# Patient Record
Sex: Female | Born: 1935 | Race: White | Hispanic: No | Smoking: Never smoker
Health system: Southern US, Community
[De-identification: ages and names within clinical notes are randomized; demographics above are authoritative.]

## PROBLEM LIST (undated history)

## (undated) DIAGNOSIS — I5189 Other ill-defined heart diseases: Secondary | ICD-10-CM

## (undated) DIAGNOSIS — M48061 Spinal stenosis, lumbar region without neurogenic claudication: Secondary | ICD-10-CM

## (undated) DIAGNOSIS — M545 Low back pain, unspecified: Secondary | ICD-10-CM

## (undated) DIAGNOSIS — G459 Transient cerebral ischemic attack, unspecified: Secondary | ICD-10-CM

## (undated) DIAGNOSIS — M1990 Unspecified osteoarthritis, unspecified site: Secondary | ICD-10-CM

## (undated) DIAGNOSIS — I080 Rheumatic disorders of both mitral and aortic valves: Secondary | ICD-10-CM

## (undated) DIAGNOSIS — N183 Chronic kidney disease, stage 3 unspecified: Secondary | ICD-10-CM

## (undated) DIAGNOSIS — I495 Sick sinus syndrome: Secondary | ICD-10-CM

## (undated) DIAGNOSIS — F419 Anxiety disorder, unspecified: Secondary | ICD-10-CM

## (undated) DIAGNOSIS — I10 Essential (primary) hypertension: Secondary | ICD-10-CM

## (undated) DIAGNOSIS — I2789 Other specified pulmonary heart diseases: Secondary | ICD-10-CM

## (undated) DIAGNOSIS — I219 Acute myocardial infarction, unspecified: Secondary | ICD-10-CM

## (undated) DIAGNOSIS — F32A Depression, unspecified: Secondary | ICD-10-CM

## (undated) DIAGNOSIS — I4891 Unspecified atrial fibrillation: Secondary | ICD-10-CM

## (undated) DIAGNOSIS — F329 Major depressive disorder, single episode, unspecified: Secondary | ICD-10-CM

## (undated) DIAGNOSIS — I509 Heart failure, unspecified: Secondary | ICD-10-CM

## (undated) DIAGNOSIS — R32 Unspecified urinary incontinence: Secondary | ICD-10-CM

## (undated) DIAGNOSIS — E785 Hyperlipidemia, unspecified: Secondary | ICD-10-CM

## (undated) DIAGNOSIS — H9190 Unspecified hearing loss, unspecified ear: Secondary | ICD-10-CM

## (undated) DIAGNOSIS — G8929 Other chronic pain: Secondary | ICD-10-CM

## (undated) DIAGNOSIS — K219 Gastro-esophageal reflux disease without esophagitis: Secondary | ICD-10-CM

## (undated) DIAGNOSIS — I82409 Acute embolism and thrombosis of unspecified deep veins of unspecified lower extremity: Secondary | ICD-10-CM

## (undated) DIAGNOSIS — Z9289 Personal history of other medical treatment: Secondary | ICD-10-CM

## (undated) DIAGNOSIS — I872 Venous insufficiency (chronic) (peripheral): Secondary | ICD-10-CM

## (undated) DIAGNOSIS — Z950 Presence of cardiac pacemaker: Secondary | ICD-10-CM

## (undated) DIAGNOSIS — C50919 Malignant neoplasm of unspecified site of unspecified female breast: Secondary | ICD-10-CM

## (undated) DIAGNOSIS — D649 Anemia, unspecified: Secondary | ICD-10-CM

## (undated) DIAGNOSIS — M4800 Spinal stenosis, site unspecified: Secondary | ICD-10-CM

## (undated) DIAGNOSIS — R609 Edema, unspecified: Secondary | ICD-10-CM

## (undated) DIAGNOSIS — E039 Hypothyroidism, unspecified: Secondary | ICD-10-CM

## (undated) DIAGNOSIS — M069 Rheumatoid arthritis, unspecified: Secondary | ICD-10-CM

## (undated) DIAGNOSIS — Z8719 Personal history of other diseases of the digestive system: Secondary | ICD-10-CM

## (undated) HISTORY — PX: BREAST BIOPSY: SHX20

## (undated) HISTORY — DX: Unspecified urinary incontinence: R32

## (undated) HISTORY — DX: Venous insufficiency (chronic) (peripheral): I87.2

## (undated) HISTORY — PX: BREAST SURGERY: SHX581

## (undated) HISTORY — PX: JOINT REPLACEMENT: SHX530

## (undated) HISTORY — DX: Essential (primary) hypertension: I10

## (undated) HISTORY — DX: Rheumatic disorders of both mitral and aortic valves: I08.0

## (undated) HISTORY — PX: INSERT / REPLACE / REMOVE PACEMAKER: SUR710

## (undated) HISTORY — DX: Gastro-esophageal reflux disease without esophagitis: K21.9

## (undated) HISTORY — DX: Other specified pulmonary heart diseases: I27.89

## (undated) HISTORY — DX: Rheumatoid arthritis, unspecified: M06.9

## (undated) HISTORY — DX: Spinal stenosis, site unspecified: M48.00

## (undated) HISTORY — DX: Unspecified hearing loss, unspecified ear: H91.90

## (undated) HISTORY — DX: Anemia, unspecified: D64.9

## (undated) HISTORY — DX: Unspecified atrial fibrillation: I48.91

## (undated) HISTORY — DX: Hypothyroidism, unspecified: E03.9

## (undated) HISTORY — DX: Other ill-defined heart diseases: I51.89

## (undated) HISTORY — DX: Transient cerebral ischemic attack, unspecified: G45.9

## (undated) HISTORY — DX: Edema, unspecified: R60.9

## (undated) HISTORY — DX: Sick sinus syndrome: I49.5

## (undated) HISTORY — DX: Malignant neoplasm of unspecified site of unspecified female breast: C50.919

## (undated) HISTORY — DX: Hyperlipidemia, unspecified: E78.5

---

## 1968-05-02 HISTORY — PX: ABDOMINAL HYSTERECTOMY: SHX81

## 1975-05-03 DIAGNOSIS — I219 Acute myocardial infarction, unspecified: Secondary | ICD-10-CM

## 1975-05-03 HISTORY — DX: Acute myocardial infarction, unspecified: I21.9

## 1993-05-02 HISTORY — PX: TOTAL HIP ARTHROPLASTY: SHX124

## 1996-05-02 HISTORY — PX: CARPAL TUNNEL RELEASE: SHX101

## 1999-04-06 HISTORY — PX: CHOLECYSTECTOMY: SHX55

## 2001-01-30 HISTORY — PX: TOTAL HIP ARTHROPLASTY: SHX124

## 2003-05-03 HISTORY — PX: PACEMAKER PLACEMENT: SHX43

## 2006-12-01 DIAGNOSIS — C50919 Malignant neoplasm of unspecified site of unspecified female breast: Secondary | ICD-10-CM

## 2006-12-01 HISTORY — DX: Malignant neoplasm of unspecified site of unspecified female breast: C50.919

## 2006-12-01 HISTORY — PX: BREAST LUMPECTOMY: SHX2

## 2007-05-03 LAB — HM COLONOSCOPY

## 2008-06-11 ENCOUNTER — Encounter: Payer: Self-pay | Admitting: Internal Medicine

## 2008-07-31 HISTORY — PX: PARATHYROIDECTOMY: SHX19

## 2008-09-02 ENCOUNTER — Encounter: Payer: Self-pay | Admitting: Internal Medicine

## 2009-01-30 LAB — CONVERTED CEMR LAB: Pap Smear: NEGATIVE

## 2009-03-05 ENCOUNTER — Encounter: Payer: Self-pay | Admitting: Internal Medicine

## 2009-03-13 ENCOUNTER — Telehealth (INDEPENDENT_AMBULATORY_CARE_PROVIDER_SITE_OTHER): Payer: Self-pay | Admitting: *Deleted

## 2009-03-18 DIAGNOSIS — C50919 Malignant neoplasm of unspecified site of unspecified female breast: Secondary | ICD-10-CM | POA: Insufficient documentation

## 2009-03-18 DIAGNOSIS — I2789 Other specified pulmonary heart diseases: Secondary | ICD-10-CM

## 2009-03-18 DIAGNOSIS — Z950 Presence of cardiac pacemaker: Secondary | ICD-10-CM

## 2009-03-18 DIAGNOSIS — I080 Rheumatic disorders of both mitral and aortic valves: Secondary | ICD-10-CM | POA: Insufficient documentation

## 2009-03-18 DIAGNOSIS — I5023 Acute on chronic systolic (congestive) heart failure: Secondary | ICD-10-CM | POA: Insufficient documentation

## 2009-03-18 DIAGNOSIS — I517 Cardiomegaly: Secondary | ICD-10-CM | POA: Insufficient documentation

## 2009-03-18 DIAGNOSIS — I495 Sick sinus syndrome: Secondary | ICD-10-CM | POA: Insufficient documentation

## 2009-03-19 ENCOUNTER — Ambulatory Visit: Payer: Self-pay | Admitting: Internal Medicine

## 2009-03-19 ENCOUNTER — Ambulatory Visit: Payer: Self-pay | Admitting: Cardiology

## 2009-03-19 DIAGNOSIS — I4891 Unspecified atrial fibrillation: Secondary | ICD-10-CM | POA: Insufficient documentation

## 2009-03-19 DIAGNOSIS — R609 Edema, unspecified: Secondary | ICD-10-CM

## 2009-03-19 HISTORY — DX: Edema, unspecified: R60.9

## 2009-03-19 LAB — CONVERTED CEMR LAB: POC INR: 2.6

## 2009-03-20 ENCOUNTER — Telehealth: Payer: Self-pay | Admitting: Internal Medicine

## 2009-04-14 ENCOUNTER — Telehealth: Payer: Self-pay | Admitting: Internal Medicine

## 2009-04-16 ENCOUNTER — Emergency Department (HOSPITAL_COMMUNITY): Admission: EM | Admit: 2009-04-16 | Discharge: 2009-04-16 | Payer: Self-pay | Admitting: Family Medicine

## 2009-04-20 ENCOUNTER — Encounter: Payer: Self-pay | Admitting: Cardiology

## 2009-04-20 LAB — CONVERTED CEMR LAB: POC INR: 3.1

## 2009-05-07 ENCOUNTER — Encounter: Payer: Self-pay | Admitting: Internal Medicine

## 2009-05-07 ENCOUNTER — Ambulatory Visit (HOSPITAL_COMMUNITY): Admission: RE | Admit: 2009-05-07 | Discharge: 2009-05-07 | Payer: Self-pay | Admitting: Internal Medicine

## 2009-05-07 ENCOUNTER — Ambulatory Visit: Payer: Self-pay | Admitting: Internal Medicine

## 2009-05-07 ENCOUNTER — Ambulatory Visit: Payer: Self-pay

## 2009-05-07 ENCOUNTER — Ambulatory Visit: Payer: Self-pay | Admitting: Cardiovascular Disease

## 2009-05-07 LAB — CONVERTED CEMR LAB: POC INR: 2.9

## 2009-05-21 ENCOUNTER — Ambulatory Visit: Payer: Self-pay | Admitting: Internal Medicine

## 2009-05-21 DIAGNOSIS — I10 Essential (primary) hypertension: Secondary | ICD-10-CM

## 2009-05-21 DIAGNOSIS — M129 Arthropathy, unspecified: Secondary | ICD-10-CM | POA: Insufficient documentation

## 2009-05-21 DIAGNOSIS — Z9189 Other specified personal risk factors, not elsewhere classified: Secondary | ICD-10-CM | POA: Insufficient documentation

## 2009-05-21 DIAGNOSIS — K219 Gastro-esophageal reflux disease without esophagitis: Secondary | ICD-10-CM | POA: Insufficient documentation

## 2009-05-21 DIAGNOSIS — Z87448 Personal history of other diseases of urinary system: Secondary | ICD-10-CM | POA: Insufficient documentation

## 2009-05-21 DIAGNOSIS — E039 Hypothyroidism, unspecified: Secondary | ICD-10-CM

## 2009-05-21 DIAGNOSIS — R32 Unspecified urinary incontinence: Secondary | ICD-10-CM

## 2009-05-21 DIAGNOSIS — E785 Hyperlipidemia, unspecified: Secondary | ICD-10-CM

## 2009-05-22 DIAGNOSIS — R490 Dysphonia: Secondary | ICD-10-CM | POA: Insufficient documentation

## 2009-05-22 LAB — CONVERTED CEMR LAB
Albumin: 3.7 g/dL (ref 3.5–5.2)
Basophils Absolute: 0 10*3/uL (ref 0.0–0.1)
CO2: 27 meq/L (ref 19–32)
Calcium: 9.3 mg/dL (ref 8.4–10.5)
Cholesterol: 127 mg/dL (ref 0–200)
Eosinophils Absolute: 0.1 10*3/uL (ref 0.0–0.7)
Glucose, Bld: 95 mg/dL (ref 70–99)
HCT: 34.3 % — ABNORMAL LOW (ref 36.0–46.0)
HDL: 54.5 mg/dL (ref >39.00)
Hemoglobin: 11.2 g/dL — ABNORMAL LOW (ref 12.0–15.0)
Lymphocytes Relative: 43.8 % (ref 12.0–46.0)
Lymphs Abs: 2.1 10*3/uL (ref 0.7–4.0)
MCHC: 32.6 g/dL (ref 30.0–36.0)
Monocytes Absolute: 0.6 10*3/uL (ref 0.1–1.0)
Neutro Abs: 1.9 10*3/uL (ref 1.4–7.7)
RDW: 15.4 % — ABNORMAL HIGH (ref 11.5–14.6)
Sodium: 139 meq/L (ref 135–145)
Total Protein: 7.3 g/dL (ref 6.0–8.3)
Triglycerides: 111 mg/dL (ref 0.0–149.0)

## 2009-06-04 ENCOUNTER — Ambulatory Visit: Payer: Self-pay | Admitting: Cardiology

## 2009-06-18 ENCOUNTER — Ambulatory Visit: Payer: Self-pay | Admitting: Cardiology

## 2009-06-24 ENCOUNTER — Telehealth (INDEPENDENT_AMBULATORY_CARE_PROVIDER_SITE_OTHER): Payer: Self-pay | Admitting: *Deleted

## 2009-06-24 DIAGNOSIS — H9190 Unspecified hearing loss, unspecified ear: Secondary | ICD-10-CM | POA: Insufficient documentation

## 2009-07-13 ENCOUNTER — Encounter: Payer: Self-pay | Admitting: Internal Medicine

## 2009-07-15 ENCOUNTER — Encounter: Payer: Self-pay | Admitting: Internal Medicine

## 2009-07-16 ENCOUNTER — Ambulatory Visit: Payer: Self-pay | Admitting: Internal Medicine

## 2009-07-16 ENCOUNTER — Encounter: Payer: Self-pay | Admitting: Internal Medicine

## 2009-08-12 ENCOUNTER — Encounter: Payer: Self-pay | Admitting: Internal Medicine

## 2009-08-17 ENCOUNTER — Ambulatory Visit: Payer: Self-pay | Admitting: Cardiology

## 2009-08-17 LAB — CONVERTED CEMR LAB: POC INR: 3.1

## 2009-08-27 ENCOUNTER — Ambulatory Visit: Payer: Self-pay | Admitting: Internal Medicine

## 2009-08-31 ENCOUNTER — Ambulatory Visit: Payer: Self-pay | Admitting: Cardiology

## 2009-08-31 LAB — CONVERTED CEMR LAB: POC INR: 3.1

## 2009-09-17 ENCOUNTER — Ambulatory Visit: Payer: Self-pay | Admitting: Internal Medicine

## 2009-09-24 ENCOUNTER — Telehealth: Payer: Self-pay | Admitting: Internal Medicine

## 2009-10-08 ENCOUNTER — Ambulatory Visit: Payer: Self-pay | Admitting: Internal Medicine

## 2009-10-08 LAB — CONVERTED CEMR LAB: POC INR: 2

## 2009-10-12 ENCOUNTER — Encounter: Payer: Self-pay | Admitting: Internal Medicine

## 2009-10-29 ENCOUNTER — Encounter: Payer: Self-pay | Admitting: Internal Medicine

## 2009-11-05 ENCOUNTER — Ambulatory Visit: Payer: Self-pay | Admitting: Internal Medicine

## 2009-11-05 LAB — CONVERTED CEMR LAB: POC INR: 2.6

## 2009-11-12 ENCOUNTER — Encounter: Payer: Self-pay | Admitting: Internal Medicine

## 2009-11-23 ENCOUNTER — Ambulatory Visit: Payer: Self-pay | Admitting: Internal Medicine

## 2009-11-23 ENCOUNTER — Inpatient Hospital Stay (HOSPITAL_COMMUNITY): Admission: EM | Admit: 2009-11-23 | Discharge: 2009-11-23 | Payer: Self-pay | Admitting: Emergency Medicine

## 2009-11-23 ENCOUNTER — Encounter: Payer: Self-pay | Admitting: Internal Medicine

## 2009-11-24 ENCOUNTER — Ambulatory Visit: Payer: Self-pay

## 2009-11-24 ENCOUNTER — Encounter: Payer: Self-pay | Admitting: Internal Medicine

## 2009-11-24 ENCOUNTER — Encounter: Payer: Self-pay | Admitting: Cardiovascular Disease

## 2009-11-24 ENCOUNTER — Ambulatory Visit: Payer: Self-pay | Admitting: Cardiovascular Disease

## 2009-11-24 ENCOUNTER — Encounter (HOSPITAL_COMMUNITY): Admission: RE | Admit: 2009-11-24 | Discharge: 2010-01-21 | Payer: Self-pay | Admitting: Internal Medicine

## 2009-11-27 ENCOUNTER — Ambulatory Visit: Payer: Self-pay | Admitting: Internal Medicine

## 2009-11-27 DIAGNOSIS — R0789 Other chest pain: Secondary | ICD-10-CM | POA: Insufficient documentation

## 2009-12-09 ENCOUNTER — Ambulatory Visit: Payer: Self-pay | Admitting: Cardiology

## 2009-12-23 ENCOUNTER — Encounter: Payer: Self-pay | Admitting: Internal Medicine

## 2009-12-23 ENCOUNTER — Ambulatory Visit: Payer: Self-pay | Admitting: Internal Medicine

## 2009-12-28 ENCOUNTER — Ambulatory Visit: Payer: Self-pay | Admitting: Internal Medicine

## 2009-12-30 LAB — CONVERTED CEMR LAB
ALT: 30 units/L (ref 0–35)
AST: 26 units/L (ref 0–37)
Bilirubin, Direct: 0.1 mg/dL (ref 0.0–0.3)
Direct LDL: 131.4 mg/dL
Total Bilirubin: 0.7 mg/dL (ref 0.3–1.2)
Total CHOL/HDL Ratio: 4

## 2010-01-06 ENCOUNTER — Ambulatory Visit: Payer: Self-pay | Admitting: Cardiovascular Disease

## 2010-01-18 ENCOUNTER — Encounter: Payer: Self-pay | Admitting: Internal Medicine

## 2010-01-26 ENCOUNTER — Ambulatory Visit: Payer: Self-pay | Admitting: Internal Medicine

## 2010-01-26 DIAGNOSIS — N390 Urinary tract infection, site not specified: Secondary | ICD-10-CM

## 2010-01-26 DIAGNOSIS — M069 Rheumatoid arthritis, unspecified: Secondary | ICD-10-CM

## 2010-01-26 LAB — CONVERTED CEMR LAB
Glucose, Urine, Semiquant: NEGATIVE
Ketones, urine, test strip: NEGATIVE
Nitrite: POSITIVE
Protein, U semiquant: >=300
pH: 5

## 2010-02-04 ENCOUNTER — Ambulatory Visit: Payer: Self-pay | Admitting: Internal Medicine

## 2010-02-04 LAB — CONVERTED CEMR LAB: POC INR: 2.1

## 2010-02-25 ENCOUNTER — Telehealth: Payer: Self-pay | Admitting: Internal Medicine

## 2010-03-02 ENCOUNTER — Telehealth: Payer: Self-pay | Admitting: Internal Medicine

## 2010-03-02 ENCOUNTER — Ambulatory Visit: Payer: Self-pay | Admitting: Internal Medicine

## 2010-03-02 DIAGNOSIS — A088 Other specified intestinal infections: Secondary | ICD-10-CM

## 2010-03-08 ENCOUNTER — Ambulatory Visit: Payer: Self-pay | Admitting: Internal Medicine

## 2010-03-08 LAB — CONVERTED CEMR LAB: POC INR: 3.5

## 2010-03-10 ENCOUNTER — Ambulatory Visit: Payer: Self-pay | Admitting: Internal Medicine

## 2010-03-10 DIAGNOSIS — R197 Diarrhea, unspecified: Secondary | ICD-10-CM

## 2010-03-15 ENCOUNTER — Encounter: Payer: Self-pay | Admitting: Internal Medicine

## 2010-03-15 LAB — CONVERTED CEMR LAB
ALT: 33 units/L (ref 0–35)
Albumin: 3.6 g/dL (ref 3.5–5.2)
Basophils Relative: 0.8 % (ref 0.0–3.0)
Bilirubin Urine: NEGATIVE
Bilirubin, Direct: 0.1 mg/dL (ref 0.0–0.3)
CO2: 26 meq/L (ref 19–32)
Chloride: 104 meq/L (ref 96–112)
Eosinophils Absolute: 0.3 10*3/uL (ref 0.0–0.7)
Eosinophils Relative: 3.3 % (ref 0.0–5.0)
HCT: 31.6 % — ABNORMAL LOW (ref 36.0–46.0)
Hemoglobin, Urine: NEGATIVE
Hemoglobin: 10.8 g/dL — ABNORMAL LOW (ref 12.0–15.0)
MCHC: 34.2 g/dL (ref 30.0–36.0)
MCV: 102.9 fL — ABNORMAL HIGH (ref 78.0–100.0)
Monocytes Absolute: 1.1 10*3/uL — ABNORMAL HIGH (ref 0.1–1.0)
Neutro Abs: 6.7 10*3/uL (ref 1.4–7.7)
Potassium: 5.1 meq/L (ref 3.5–5.1)
RBC: 3.07 M/uL — ABNORMAL LOW (ref 3.87–5.11)
Sodium: 140 meq/L (ref 135–145)
Total Protein, Urine: NEGATIVE mg/dL
Total Protein: 6.4 g/dL (ref 6.0–8.3)
Urine Glucose: NEGATIVE mg/dL
Urobilinogen, UA: 0.2 (ref 0.0–1.0)
WBC: 10 10*3/uL (ref 4.5–10.5)

## 2010-03-16 ENCOUNTER — Encounter: Payer: Self-pay | Admitting: Internal Medicine

## 2010-03-18 ENCOUNTER — Telehealth: Payer: Self-pay | Admitting: Internal Medicine

## 2010-03-18 ENCOUNTER — Ambulatory Visit: Payer: Self-pay | Admitting: Internal Medicine

## 2010-03-24 ENCOUNTER — Telehealth: Payer: Self-pay | Admitting: Internal Medicine

## 2010-04-01 ENCOUNTER — Ambulatory Visit: Payer: Self-pay | Admitting: Cardiovascular Disease

## 2010-04-02 ENCOUNTER — Telehealth: Payer: Self-pay | Admitting: Internal Medicine

## 2010-04-12 ENCOUNTER — Ambulatory Visit: Payer: Self-pay | Admitting: Internal Medicine

## 2010-04-28 ENCOUNTER — Ambulatory Visit: Payer: Self-pay | Admitting: Cardiology

## 2010-05-10 ENCOUNTER — Encounter: Payer: Self-pay | Admitting: Internal Medicine

## 2010-05-12 ENCOUNTER — Ambulatory Visit
Admission: RE | Admit: 2010-05-12 | Discharge: 2010-05-12 | Payer: Self-pay | Source: Home / Self Care | Attending: Internal Medicine | Admitting: Internal Medicine

## 2010-05-12 ENCOUNTER — Other Ambulatory Visit: Payer: Self-pay | Admitting: Internal Medicine

## 2010-05-12 DIAGNOSIS — R509 Fever, unspecified: Secondary | ICD-10-CM | POA: Insufficient documentation

## 2010-05-12 LAB — CBC WITH DIFFERENTIAL/PLATELET
Basophils Absolute: 0 10*3/uL (ref 0.0–0.1)
Basophils Relative: 0.7 % (ref 0.0–3.0)
Eosinophils Absolute: 0.2 10*3/uL (ref 0.0–0.7)
Eosinophils Relative: 3.3 % (ref 0.0–5.0)
HCT: 30.8 % — ABNORMAL LOW (ref 36.0–46.0)
Hemoglobin: 10.6 g/dL — ABNORMAL LOW (ref 12.0–15.0)
Lymphocytes Relative: 27.8 % (ref 12.0–46.0)
Lymphs Abs: 1.5 10*3/uL (ref 0.7–4.0)
MCHC: 34.2 g/dL (ref 30.0–36.0)
MCV: 102 fl — ABNORMAL HIGH (ref 78.0–100.0)
Monocytes Absolute: 0.6 10*3/uL (ref 0.1–1.0)
Monocytes Relative: 11.9 % (ref 3.0–12.0)
Neutro Abs: 3 10*3/uL (ref 1.4–7.7)
Neutrophils Relative %: 56.3 % (ref 43.0–77.0)
Platelets: 129 10*3/uL — ABNORMAL LOW (ref 150.0–400.0)
RBC: 3.03 Mil/uL — ABNORMAL LOW (ref 3.87–5.11)
RDW: 16.8 % — ABNORMAL HIGH (ref 11.5–14.6)
WBC: 5.3 10*3/uL (ref 4.5–10.5)

## 2010-05-12 LAB — BASIC METABOLIC PANEL
BUN: 14 mg/dL (ref 6–23)
CO2: 25 mEq/L (ref 19–32)
Calcium: 8.4 mg/dL (ref 8.4–10.5)
Chloride: 107 mEq/L (ref 96–112)
Creatinine, Ser: 0.7 mg/dL (ref 0.4–1.2)
GFR: 81.58 mL/min (ref >60.00)
Glucose, Bld: 95 mg/dL (ref 70–99)
Potassium: 4.2 mEq/L (ref 3.5–5.1)
Sodium: 140 mEq/L (ref 135–145)

## 2010-05-12 LAB — HEPATIC FUNCTION PANEL
ALT: 26 U/L (ref 0–35)
AST: 29 U/L (ref 0–37)
Albumin: 3.4 g/dL — ABNORMAL LOW (ref 3.5–5.2)
Alkaline Phosphatase: 59 U/L (ref 39–117)
Bilirubin, Direct: 0.2 mg/dL (ref 0.0–0.3)
Total Bilirubin: 0.6 mg/dL (ref 0.3–1.2)
Total Protein: 6 g/dL (ref 6.0–8.3)

## 2010-05-13 ENCOUNTER — Telehealth: Payer: Self-pay | Admitting: Internal Medicine

## 2010-05-13 LAB — URINALYSIS, ROUTINE W REFLEX MICROSCOPIC
Bilirubin Urine: NEGATIVE
Hemoglobin, Urine: NEGATIVE
Ketones, ur: NEGATIVE
Nitrite: NEGATIVE
Specific Gravity, Urine: 1.01 (ref 1.000–1.030)
Total Protein, Urine: NEGATIVE
Urine Glucose: NEGATIVE
Urobilinogen, UA: 0.2 (ref 0.0–1.0)
pH: 6.5 (ref 5.0–8.0)

## 2010-05-19 ENCOUNTER — Encounter: Payer: Self-pay | Admitting: Internal Medicine

## 2010-05-24 ENCOUNTER — Encounter: Payer: Self-pay | Admitting: Internal Medicine

## 2010-05-25 ENCOUNTER — Ambulatory Visit: Admission: RE | Admit: 2010-05-25 | Discharge: 2010-05-25 | Payer: Self-pay | Source: Home / Self Care

## 2010-05-26 ENCOUNTER — Encounter: Payer: Self-pay | Admitting: Internal Medicine

## 2010-06-01 NOTE — Medication Information (Signed)
Summary: rov/ewj  Anticoagulant Therapy  Managed by: Loma Newton, PharmD Referring MD: Johney Frame MD, Fayrene Fearing PCP: Newt Lukes MD Supervising MD: Juanda Chance MD, Sequoia Mincey Indication 1: Atrial Fibrillation (chronic) Lab Used: [facility] [place] Site: [address] INR POC 3.1 INR RANGE 2.0-3.0  Dietary changes: no    Health status changes: no    Bleeding/hemorrhagic complications: no    Recent/future hospitalizations: no    Any changes in medication regimen? no    Recent/future dental: no  Any missed doses?: no       Is patient compliant with meds? yes       Current Medications (verified): 1)  Lisinopril 20 Mg Tabs (Lisinopril) .... Take One Tablet By Mouth Daily 2)  Warfarin Sodium 3 Mg Tabs (Warfarin Sodium) .... Use As Directed By Anticoagualtion Clinic 3)  Lopressor 100 Mg Tabs (Metoprolol Tartrate) .... Take One Tablet Once Daily 4)  Synthroid 25 Mcg Tabs (Levothyroxine Sodium) .... Take One Tablet By Mouth Once Daily. 5)  Lipitor 10 Mg Tabs (Atorvastatin Calcium) .... Take One Tablet By Mouth Once Daily. 6)  Furosemide 80 Mg Tabs (Furosemide) .[name].. 1 By Mouth Two Times A Day X7days, Then Once Daily (Or As Directed) 7)  K-Tabs 10 Meq Cr-Tabs (Potassium Chloride) .[name].. 1 By Mouth Two Times A Day X 7days, Then Once Daily (Or As Directed) 8)  Protonix 40 Mg Tbec (Pantoprazole Sodium) .... Take One Tablet By Mouth Once Daily. 9)  Calcium Carbonate   Powd (Calcium Carbonate) .... Uad 10)  Vitamin C 500 Mg Chew (Ascorbic Acid) .... Once Daily 11)  Lecithin 400 Mg Caps (Lecithin) .... Take One Tablet Daily 12)  Vitamin D3 1000 Unit Caps (Cholecalciferol) .... 2 Caps Daily 13)  Folic Acid 800 Mcg Tabs (Folic Acid) .... Once Daily 14)  Cvs Vitamin B12 100 Mcg Tabs (Cyanocobalamin) .... Once Daily 15)  Ferrous Sulfate 325 (65 Fe) Mg Tabs (Ferrous Sulfate) .... 2 Tablets Daily 16)  Carafate 1 Gm/78ml Susp (Sucralfate) .... Use As Directed 17)  Citalopram  Hydrobromide 20 Mg Tabs (Citalopram Hydrobromide) .... Take 1 By Mouth Qd 18)  Fentanyl 50 Mcg/hr Pt72 (Fentanyl) .... Use As Directed 19)  Co Q-10 100 Mg Caps (Coenzyme Q10) .... Take 1 By Mouth Qd 20)  Fish Oil 300 Mg Caps (Omega-3 Fatty Acids) .... Once Daily 21)  Tylenol Extra Strength 500 Mg Tabs (Acetaminophen) .[name].. 1-2 By Mouth Evry 6 Hours As Needed For Breakthrough Pain 22)  Claritin 10 Mg Tabs (Loratadine) .[name].. 1 By Mouth Once Daily X 7 Days, Then As Needed For Allergy/hoarseness  Allergies (verified): 1)  ! Ultram  Anticoagulation Management History:      Positive risk factors for bleeding include an age of 57 years or older.  The bleeding index is 'intermediate risk'.  Positive CHADS2 values include History of CHF, History of HTN, and Age > 54 years old.  Anticoagulation responsible provider: Juanda Chance MD, Smitty Cords.  INR POC: 3.1.  Cuvette Lot#: [id number].  Exp: 10/2010.    Anticoagulation Management Assessment/Plan:      The patient's current anticoagulation dose is Warfarin sodium 3 mg tabs: Use as directed by Anticoagualtion Clinic.  The next INR is due 08/31/2009.  Anticoagulation instructions were given to patient.  Results were reviewed/authorized by Loma Newton, PharmD.  She was notified by Loma Newton.         Prior Anticoagulation Instructions: INR 3.2  Take 1/2 tablet tonight, then resume same dosage 1 tablet daily except 1/2 tablet  on Fridays.  Recheck in 4 weeks.   Current Anticoagulation Instructions: INR = 3.1 (goal = 2-3)  Hold tonights coumadin dose  The patient's new dosage of coumadin will be decreased.  The new dosage includes:  Take 1 tablet every day except for friday and saturday take half a tablet

## 2010-06-01 NOTE — Medication Information (Signed)
Summary: rov/sp  Anticoagulant Therapy  Managed by: Minerva Areola, RN Referring MD: Johney Frame MD, Fayrene Fearing PCP: Newt Lukes MD Supervising MD: Tenny Craw MD, Gunnar Fusi Indication 1: Atrial Fibrillation (chronic) Lab Used: [facility] [place] Site: [address] INR POC 2.0 INR RANGE 2.0-3.0  Dietary changes: no    Health status changes: no    Bleeding/hemorrhagic complications: no    Recent/future hospitalizations: no    Any changes in medication regimen? no    Recent/future dental: no  Any missed doses?: no       Is patient compliant with meds? yes       Current Medications (verified): 1)  Lisinopril 20 Mg Tabs (Lisinopril) .... Take One Tablet By Mouth Daily 2)  Warfarin Sodium 3 Mg Tabs (Warfarin Sodium) .... Use As Directed By Anticoagualtion Clinic 3)  Lopressor 100 Mg Tabs (Metoprolol Tartrate) .... Take One Tablet Once Daily 4)  Synthroid 25 Mcg Tabs (Levothyroxine Sodium) .... Take One Tablet By Mouth Once Daily. 5)  Lipitor 10 Mg Tabs (Atorvastatin Calcium) .... Take One Tablet By Mouth Once Daily. 6)  Furosemide 80 Mg Tabs (Furosemide) .[name].. 1 By Mouth Two Times A Day X7days, Then Once Daily (Or As Directed) 7)  K-Tabs 10 Meq Cr-Tabs (Potassium Chloride) .[name].. 1 By Mouth Two Times A Day X 7days, Then Once Daily (Or As Directed) 8)  Protonix 40 Mg Tbec (Pantoprazole Sodium) .... Take One Tablet By Mouth Once Daily. 9)  Calcium Carbonate   Powd (Calcium Carbonate) .... Uad 10)  Vitamin C 500 Mg Chew (Ascorbic Acid) .... Once Daily 11)  Lecithin 400 Mg Caps (Lecithin) .... Take One Tablet Daily 12)  Vitamin D3 1000 Unit Caps (Cholecalciferol) .... 2 Caps Daily 13)  Folic Acid 800 Mcg Tabs (Folic Acid) .... Once Daily 14)  Cvs Vitamin B12 100 Mcg Tabs (Cyanocobalamin) .... Once Daily 15)  Ferrous Sulfate 325 (65 Fe) Mg Tabs (Ferrous Sulfate) .... 2 Tablets Daily 16)  Carafate 1 Gm/71ml Susp (Sucralfate) .... Use As Directed 17)  Citalopram Hydrobromide 20  Mg Tabs (Citalopram Hydrobromide) .... Take 1 By Mouth Qd 18)  Fentanyl 75 Mcg/hr Pt72 (Fentanyl) .... Change Every 72 Hours 19)  Co Q-10 100 Mg Caps (Coenzyme Q10) .... Take 1 By Mouth Qd 20)  Fish Oil 300 Mg Caps (Omega-3 Fatty Acids) .... Once Daily 21)  Tylenol Extra Strength 500 Mg Tabs (Acetaminophen) .[name].. 1-2 By Mouth Evry 6 Hours As Needed For Breakthrough Pain 22)  Claritin 10 Mg Tabs (Loratadine) .[name].. 1 By Mouth Once Daily X 7 Days, Then As Needed For Allergy/hoarseness  Allergies (verified): 1)  ! Ultram  Anticoagulation Management History:      The patient is taking warfarin and comes in today for a routine follow up visit.  Positive risk factors for bleeding include an age of 13 years or older.  The bleeding index is 'intermediate risk'.  Positive CHADS2 values include History of CHF, History of HTN, and Age > 39 years old.  Anticoagulation responsible provider: Tenny Craw MD, Gunnar Fusi.  INR POC: 2.0.  Cuvette Lot#: [id number].  Exp: 12/2010.    Anticoagulation Management Assessment/Plan:      The patient's current anticoagulation dose is Warfarin sodium 3 mg tabs: Use as directed by Anticoagualtion Clinic.  The target INR is 2.0-3.0.  The next INR is due 10/07/2009.  Anticoagulation instructions were given to patient.  Results were reviewed/authorized by Minerva Areola, RN.  She was notified by Minerva Areola, RN, BSN.  Prior Anticoagulation Instructions: INR 2.1  Continue same dose of 1 tablet every day except 1/2 tablet on Monday, Wednesday and Friday   Current Anticoagulation Instructions: The patient is to continue with the same dose of coumadin.  This dosage includes: one whole tablet every day except a half of a tablet on Mondays, Wednesdays and Fridays.  Slightly reduce intake of dark green leafy vegetables.

## 2010-06-01 NOTE — Progress Notes (Signed)
Summary: align  Phone Note Call from Patient   Caller: Patient Summary of Call: Want to know how long should she take the Align, and [facility]. Pls call Britta Mccreedy @ [phone] Initial call taken by: Orlan Leavens RMA,  March 18, 2010 10:50 AM  Follow-up for Phone Call        Align is OTC - not sure the pharmacy will help with this but can send erx if desired - i rec 30d course (also safe to take everyday as needed after 30days tx) Follow-up by: Newt Lukes MD,  March 18, 2010 12:37 PM  Additional Follow-up for Phone Call Additional follow up Details #1::        Tried to contact daughter & mother several x's both #'s are busy Additional Follow-up by: Orlan Leavens RMA,  March 18, 2010 3:33 PM    Additional Follow-up for Phone Call Additional follow up Details #2::    called daughter Britta Mccreedy) line busy. Called pt no ansew LMOM RTC Follow-up by: Orlan Leavens RMA,  March 19, 2010 10:17 AM  Additional Follow-up for Phone Call Additional follow up Details #3:: Details for Additional Follow-up Action Taken: Notified pt with md recommendations. [facility] Additional Follow-up by: Orlan Leavens RMA,  March 19, 2010 12:01 PM  Prescriptions: ALIGN  CAPS (PROBIOTIC PRODUCT) 1-2 by mouth once daily  #30 x 1   Entered by:   Orlan Leavens RMA   Authorized by:   Newt Lukes MD   Signed by:   Orlan Leavens RMA on 03/18/2010   Method used:   [phone][facility] (retail[facility]       [address] [place], [place]  [address]       Ph: [phone]       Fax: [phone]   RxID:   [id number]

## 2010-06-01 NOTE — Assessment & Plan Note (Signed)
Summary: Cardiology Nuclear Testing  Nuclear Med Background Indications for Stress Test: Evaluation for Ischemia, Post Hospital  Indications Comments: 11/22/09 [facility]: CP/Dizziness  History: Echo, Myocardial Infarction, Myocardial Perfusion Study  History Comments: '78 and '90s MI mild per pt. 2005 MPS Nl per pt. No report '05 Pacemaker: Tachy/Brady Syndrome 05/07/09 ECHO EF60-65%  Symptoms: Chest Pain, Diaphoresis, SOB    Nuclear Pre-Procedure Cardiac Risk Factors: Family History - CAD, Hypertension, Lipids Caffeine/Decaff Intake: None NPO After: 7:30 PM Lungs: clear IV 0.9% NS with Angio Cath: 22g     IV Site: (R) Forearm IV Started by: Stanton Kidney EMT-P Chest Size (in) 40     Cup Size C     Height (in): 62 Weight (lb): 147 BMI: 26.98 Tech Comments: Lopressor held this am, per Patient.  Nuclear Med Study 1 or 2 day study:  1 day     Stress Test Type:  Eugenie Birks Reading MD:  Charlton Haws, MD     Referring MD:  Johney Frame MD, Fayrene Fearing Resting Radionuclide:  Technetium 47m Tetrofosmin     Resting Radionuclide Dose:  11.0 mCi  Stress Radionuclide:  Technetium 67m Tetrofosmin     Stress Radionuclide Dose:  32.0 mCi   Stress Protocol   Lexiscan: 0.4 mg   Stress Test Technologist:  Milana Na EMT-P     Nuclear Technologist:  Domenic Polite CNMT  Rest Procedure  Myocardial perfusion imaging was performed at rest 45 minutes following the intravenous administration of Myoview Technetium 63m Tetrofosmin.  Stress Procedure  The patient received IV Lexiscan 0.4 mg over 15-seconds.  Myoview injected at 30-seconds.  There were no significant changes with infusion.  Quantitative spect images were obtained after a 45 minute delay.  QPS Raw Data Images:  Normal; no motion artifact; normal heart/lung ratio. Stress Images:  NI: Uniform and normal uptake of tracer in all myocardial segments. Rest Images:  Normal homogeneous uptake in all areas of the myocardium. Subtraction (SDS):   Normal Transient Ischemic Dilatation:  1.12  (Normal <1.22)  Lung/Heart Ratio:  .34  (Normal <0.45)  Quantitative Gated Spect Images QGS EDV:  85 ml QGS ESV:  26 ml QGS EF:  69 % QGS cine images:  normal  Findings Normal nuclear study      Overall Impression  Exercise Capacity: Lexiscan BP Response: Normal blood pressure response. Clinical Symptoms: Woozy and fatigue ECG Impression: No significant ST segment change suggestive of ischemia. Overall Impression: Normal stress nuclear study.  Appended Document: Cardiology Nuclear Testing Pt informed of normal results during 11/27/09 office visit

## 2010-06-01 NOTE — Assessment & Plan Note (Signed)
Summary: diarrhea/#/cd   Vital Signs:  Patient profile:   77 year old female Height:      62 inches (157.48 cm) Weight:      153.8 pounds (69.91 kg) O2 Sat:      93 % on Room air Temp:     99.0 degrees F (37.22 degrees C) oral Pulse rate:   85 / minute BP sitting:   132 / 72  (left arm) Cuff size:   regular  Vitals Entered By: Orlan Leavens RMA (March 02, 2010 2:47 PM)  O2 Flow:  Room air CC: diarrhea Is Patient Diabetic? No Pain Assessment Patient in pain? no      Comments Pt states diarrhea started yesterday. ? Amoxillian that was rx    Primary Care Provider:  Newt Lukes MD  CC:  diarrhea.  History of Present Illness: here for n/v/d onset <24h ago (9pm last night) began as diarrhea, progressed to vomitting - color "dark green" on bith sides - no bloody emesis or stools mild right abd pain while vomitting - no pain ayt this time no undercooked or leftover foods -  no known sick contacts no travle, no fever -  using immodium with some improvement - no v/d since 10am today (last 4 hours)  reviewed chronic med issues: 1) arthritis - now clarified as RA after meeting with rheum 10/2009 - prev taking darvocet  until removed from market -  also on fentanyl patch for pain control - primary pain symptoms are in shoulders and prox thighs but +pain "all over everywhere" -- remotely on steroids per chart but not in long while  - no weakness, no leg or back pain (also hx spinal stenosis noted) - no joint swelling   2) HTN -reports compliance with ongoing medical treatment and no changes in medication dose or frequency. denies adverse side effects related to current therapy.   3) dyslipidemia - on fish oil but no statin due to persistingly inc CK 11/2009(lipitor stopped 10/2009 due to inc CK in hosp) - follows with cards for same  4) edema - chronic problem in both legs but reports worse since moving to [place] end of 2010- not a/w SOB or abd swelling - a/w occ weight gain into  150s# - concern kidneys or liver not working well - has new set of compression hose which seem effective per pt  5) hypothyroid - reports compliance with ongoing medical treatment and no changes in medication dose or frequency. denies adverse side effects related to current therapy. no weight changes or bowel or skin changes  also c/o dysura- inc ur freq, esp nocturia - mild burn, small vol voids - denies abd pain or fever -hx same but no UTI > 5 years  Current Medications (verified): 1)  Lisinopril 20 Mg Tabs (Lisinopril) .... Take One Tablet By Mouth Daily 2)  Warfarin Sodium 3 Mg Tabs (Warfarin Sodium) .... Use As Directed By Anticoagualtion Clinic 3)  Lopressor 100 Mg Tabs (Metoprolol Tartrate) .... Take One Tablet Once Daily 4)  Synthroid 25 Mcg Tabs (Levothyroxine Sodium) .... Take One Tablet By Mouth Once Daily. 5)  Furosemide 40 Mg Tabs (Furosemide) .... Take One Tablet By Mouth Daily. 6)  K-Tabs 10 Meq Cr-Tabs (Potassium Chloride) .[name].. 1 By Mouth Once Daily 7)  Protonix 40 Mg Tbec (Pantoprazole Sodium) .... Take One Tablet By Mouth Once Daily. 8)  Lecithin 400 Mg Caps (Lecithin) .... Take One Tablet Daily 9)  Vitamin D3 1000 Unit Caps (Cholecalciferol) .Marland KitchenMarland KitchenMarland Kitchen  1 Daily 10)  Folic Acid 800 Mcg Tabs (Folic Acid) .... Once Daily 11)  Cvs Vitamin B12 100 Mcg Tabs (Cyanocobalamin) .... Once Daily 12)  Ferrous Sulfate 325 (65 Fe) Mg Tabs (Ferrous Sulfate) .... 21 Tab Once Daily 13)  Carafate 1 Gm/73ml Susp (Sucralfate) .... Use As Directed 14)  Citalopram Hydrobromide 20 Mg Tabs (Citalopram Hydrobromide) .... Take 1 By Mouth Qd 15)  Fentanyl 75 Mcg/hr Pt72 (Fentanyl) .... Change Every 72 Hours 16)  Co Q-10 100 Mg Caps (Coenzyme Q10) .... Take 1 By Mouth Qd 17)  Fish Oil 300 Mg Caps (Omega-3 Fatty Acids) .... Once Daily 18)  Tylenol Extra Strength 500 Mg Tabs (Acetaminophen) .[name].. 1-2 By Mouth Evry 6 Hours As Needed For Breakthrough Pain 19)  Arava 10 Mg Tabs (Leflunomide) .... Once Daily 20)   Beta Carotene  Crys ([employer]s) .... Once Daily 21)  Zinc Magnesium .... Uad 22)  Amoxicillin 500 Mg Caps (Amoxicillin) .[name].. 1 By Mouth Three Times A Day X 10d  Allergies (verified): 1)  ! 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 Spinal stenosis GERD Hyperlipidemia - abn CK on lipitor - stopped 10/2009 Hypertension Hypothyroidism  MD roster:  cards - allred rheum - anderson  Social History: Lives at [facility] since 03/2009.   married, lives with spouse  moved here from [place] to be near her son.   Retired Futures trader.  Denies tobacco.  Rare ETOH.  Denies drugs.  Review of Systems  The patient denies fever, weight loss, syncope, peripheral edema, headaches, melena, and hematochezia.    Physical Exam  General:  thin, elderly female, NAD - nontoxic Lungs:  normal respiratory effort, no intercostal retractions or use of accessory muscles; normal breath sounds bilaterally - no crackles and no wheezes.    Heart:  normal rate, regular rhythm, no murmur, and no rub. BLE with 1++ soft edema to knees, most prominient at ankles - wearing knee high compression hose -  Abdomen:  soft, non-tender, normal bowel sounds, no distention; no masses and no appreciable hepatomegaly or splenomegaly.     Impression & Recommendations:  Problem # 1:  VIRAL GASTROENTERITIS (ICD-008.8)  rec BRAT diet and careful hydration -  cont immodium as needed and zofran as needed (erx done)  Orders: Prescription Created Electronically [id number])  Problem # 2:  UTI (ICD-599.0)  reduce dose abx (selection based on allg and potential  coumadin interactions)  Encouraged to push clear liquids, get enough rest, and take acetaminophen as needed. To be seen in 10 days if no improvement, sooner if worse.  Her updated medication list for this problem includes:    Amoxicillin 250 Mg Caps (Amoxicillin) .[name]... 1 by mouth three times a day x 7 days  Orders: Prescription Created Electronically [phone])  Complete Medication List: 1)  Lisinopril 20 Mg Tabs (Lisinopril) .... Take one tablet by mouth daily 2)  Warfarin Sodium 3 Mg Tabs (Warfarin sodium) .... Use as directed by anticoagualtion clinic 3)  Lopressor 100 Mg Tabs (Metoprolol tartrate) .... Take one tablet once daily 4)  Synthroid 25 Mcg Tabs (Levothyroxine sodium) .... Take one tablet by mouth once daily. 5)  Furosemide 40 Mg Tabs (Furosemide) .Marland KitchenMarland KitchenMarland Kitchen  Take one tablet by mouth daily. 6)  K-tabs 10 Meq Cr-tabs (Potassium chloride) .[name].. 1 by mouth once daily 7)  Protonix 40 Mg Tbec (Pantoprazole sodium) .... Take one tablet by mouth once daily. 8)  Lecithin 400 Mg Caps (Lecithin) .... Take one tablet daily 9)  Vitamin D3 1000 Unit Caps (Cholecalciferol) .[name].. 1 daily 10)  Folic Acid 800 Mcg Tabs (Folic acid) .... Once daily 11)  Cvs Vitamin B12 100 Mcg Tabs (Cyanocobalamin) .... Once daily 12)  Ferrous Sulfate 325 (65 Fe) Mg Tabs (Ferrous sulfate) .... 21 tab once daily 13)  Carafate 1 Gm/50ml Susp (Sucralfate) .... Use as directed 14)  Citalopram Hydrobromide 20 Mg Tabs (Citalopram hydrobromide) .... Take 1 by mouth qd 15)  Fentanyl 75 Mcg/hr Pt72 (Fentanyl) .... Change every 72 hours 16)  Co Q-10 100 Mg Caps (Coenzyme q10) .... Take 1 by mouth qd 17)  Fish Oil 300 Mg Caps (Omega-3 fatty acids) .... Once daily 18)  Tylenol Extra Strength 500 Mg Tabs (Acetaminophen) .[name].. 1-2 by mouth evry 6 hours as needed for breakthrough pain 19)  Arava 10 Mg Tabs (Leflunomide) .... Once daily 20)  Beta Carotene Crys (Bulk chemicals) .... Once daily 21)  Zinc Magnesium  .... Uad 22)   Amoxicillin 250 Mg Caps (Amoxicillin) .[name].. 1 by mouth three times a day x 7 days 23)  Zofran 4 Mg Tabs (Ondansetron hcl) .[name].. 1 by mouth every 6 hours as needed for nausea or vomitting  Patient Instructions: 1)  it was good to see you today. 2)  BRAT diet (bread, rice, applesauce and toast) 3)  the main problem with gastroenteritis is dehydration. Drink plenty of fluids and take solids as you feel better (see BRAT above). 4)  use Zofran as needed for nasuea and vomitting and conmtnue immodium as needed for diarrhea (max 8 tab/day) 5)  also reduce dose amoxicillin for bladder infection 6)  your prescriptions have been electronically submitted to your pharmacy. Please take as directed. Contact our office if you believe you're having problems with the medication(s).  7)  Please schedule a follow-up appointment as needed or as scheduled for thyroid check as planned Prescriptions: ZOFRAN 4 MG TABS (ONDANSETRON HCL) 1 by mouth every 6 hours as needed for nausea or vomitting  #10 x 0   Entered and Authorized by:   Newt Lukes MD   Signed by:   Newt Lukes MD on 03/02/2010   Method used:   [facility] [phone]* (retail)       [address] [place], [place]  [address]       Ph: [phone]       Fax: [phone]   RxID:   [id number] AMOXICILLIN 250 MG CAPS (AMOXICILLIN) 1 by mouth three times a day x 7 days  #21 x 0   Entered and Authorized by:   Newt Lukes MD   Signed by:   Newt Lukes MD on 03/02/2010   Method used:   [facility]        [facility] [address] # [phone]* (retail)       [address] [place], [place]  [address]       Ph: [phone]       Fax: [phone]   RxID:   [id number]    Orders Added: 1)  Est. Patient Level IV [  99214] 2)  Prescription Created Electronically [phone]

## 2010-06-01 NOTE — Consult Note (Signed)
Summary: [facility]   Imported By: Sherian Rein 11/06/2009 10:42:31  _____________________________________________________________________  External Attachment:    Type:   Image     Comment:   External Document

## 2010-06-01 NOTE — Assessment & Plan Note (Signed)
Summary: 3 mth fu---stc  #   Vital Signs:  Patient profile:   78 year old female Height:      64 inches (162.56 cm) Weight:      148.4 pounds (67.45 kg) BMI:     25.56 O2 Sat:      96 % on Room air Temp:     98.6 degrees F (37.00 degrees C) oral Pulse rate:   62 / minute BP sitting:   122 / 60  (left arm) Cuff size:   regular  Vitals Entered By: Orlan Leavens (August 27, 2009 10:56 AM)  O2 Flow:  Room air CC: 3 month follow-up/ pt req 90 day rx on Fentanyl and one for local [facility] Is Patient Diabetic? No Pain Assessment Patient in pain? no        Primary Care Provider:  Newt Lukes MD  CC:  3 month follow-up/ pt req 90 day rx on Fentanyl and one for local [facility].  History of Present Illness: here for 3 month follow up-  1) OA - was taking darvocet for control of breakthru pain symptoms until removed from [facility] last year -  also on fentanyl patch for pain control but "not working anymore" - primary pain symptoms are in shoulders and prox thighs but +pain "all over everywhere" - ?hx PMR, dx made years ago prior to move to [place] - remotedly on steroids per chart but not in long while  - no weakness, no leg or back pain (also hx spinal stenosis noted) - no joint swelling   2) HTN reports compliance with ongoing medical treatment and no changes in medication dose or frequency. denies adverse side effects related to current therapy.   3) dyslipidemia - reports compliance with ongoing medical treatment and no changes in medication dose or frequency. denies adverse side effects related to current therapy.   4) edema - chronic problem in both legs but reports worse since moving to [place] end of 2010- not a/w SOB or abd swelling - a/w occ weight gain into 150s# - concerned her kidneys or liver is not working well - has new set of compression hose which do seem effective per pt  5) hypothyroid - reports compliance with ongoing medical treatment and no changes in  medication dose or frequency. denies adverse side effects related to current therapy. no weight changes or bowel or skin changes  Clinical Review Panels:  Immunizations   Last Tetanus Booster:  Historical (05/02/2004)   Last Flu Vaccine:  Historical (01/30/2009)   Last Pneumovax:  Historical (05/03/2007)  Lipid Management   Cholesterol:  127 (05/21/2009)   LDL (bad choesterol):  50 (05/21/2009)   HDL (good cholesterol):  54.50 (05/21/2009)  CBC   WBC:  4.7 (05/21/2009)   RBC:  3.29 (05/21/2009)   Hgb:  11.2 (05/21/2009)   Hct:  34.3 (05/21/2009)   Platelets:  98.0 (05/21/2009)   MCV  104.5 (05/21/2009)   MCHC  32.6 (05/21/2009)   RDW  15.4 (05/21/2009)   PMN:  40.9 (05/21/2009)   Lymphs:  43.8 (05/21/2009)   Monos:  12.6 (05/21/2009)   Eosinophils:  2.7 (05/21/2009)   Basophil:  0.0 (05/21/2009)  Complete Metabolic Panel   Glucose:  95 (05/21/2009)   Sodium:  139 (05/21/2009)   Potassium:  3.9 (05/21/2009)   Chloride:  104 (05/21/2009)   CO2:  27 (05/21/2009)   BUN:  28 (05/21/2009)   Creatinine:  1.0 (05/21/2009)   Albumin:  3.7 (05/21/2009)   Total Protein:  7.3 (05/21/2009)   Calcium:  9.3 (05/21/2009)   Total Bili:  0.8 (05/21/2009)   Alk Phos:  56 (05/21/2009)   SGPT (ALT):  27 (05/21/2009)   SGOT (AST):  31 (05/21/2009)   Current Medications (verified): 1)  Lisinopril 20 Mg Tabs (Lisinopril) .... Take One Tablet By Mouth Daily 2)  Warfarin Sodium 3 Mg Tabs (Warfarin Sodium) .... Use As Directed By Anticoagualtion Clinic 3)  Lopressor 100 Mg Tabs (Metoprolol Tartrate) .... Take One Tablet Once Daily 4)  Synthroid 25 Mcg Tabs (Levothyroxine Sodium) .... Take One Tablet By Mouth Once Daily. 5)  Lipitor 10 Mg Tabs (Atorvastatin Calcium) .... Take One Tablet By Mouth Once Daily. 6)  Furosemide 80 Mg Tabs (Furosemide) .[name].. 1 By Mouth Two Times A Day X7days, Then Once Daily (Or As Directed) 7)  K-Tabs 10 Meq Cr-Tabs (Potassium Chloride) .[name].. 1 By Mouth Two Times A  Day X 7days, Then Once Daily (Or As Directed) 8)  Protonix 40 Mg Tbec (Pantoprazole Sodium) .... Take One Tablet By Mouth Once Daily. 9)  Calcium Carbonate   Powd (Calcium Carbonate) .... Uad 10)  Vitamin C 500 Mg Chew (Ascorbic Acid) .... Once Daily 11)  Lecithin 400 Mg Caps (Lecithin) .... Take One Tablet Daily 12)  Vitamin D3 1000 Unit Caps (Cholecalciferol) .... 2 Caps Daily 13)  Folic Acid 800 Mcg Tabs (Folic Acid) .... Once Daily 14)  Cvs Vitamin B12 100 Mcg Tabs (Cyanocobalamin) .... Once Daily 15)  Ferrous Sulfate 325 (65 Fe) Mg Tabs (Ferrous Sulfate) .... 2 Tablets Daily 16)  Carafate 1 Gm/3ml Susp (Sucralfate) .... Use As Directed 17)  Citalopram Hydrobromide 20 Mg Tabs (Citalopram Hydrobromide) .... Take 1 By Mouth Qd 18)  Fentanyl 50 Mcg/hr Pt72 (Fentanyl) .... Use As Directed 19)  Co Q-10 100 Mg Caps (Coenzyme Q10) .... Take 1 By Mouth Qd 20)  Fish Oil 300 Mg Caps (Omega-3 Fatty Acids) .... Once Daily 21)  Tylenol Extra Strength 500 Mg Tabs (Acetaminophen) .[name].. 1-2 By Mouth Evry 6 Hours As Needed For Breakthrough Pain 22)  Claritin 10 Mg Tabs (Loratadine) .[name].. 1 By Mouth Once Daily X 7 Days, Then As Needed For Allergy/hoarseness  Allergies (verified): 1)  ! 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 Spinal stenosis GERD Hyperlipidemia Hypertension Hypothyroidism  MD rooster:  cards - allred rheum -  Review of Systems  The patient denies fever, weight loss, and headaches.         c/o hemrrhoids bleeding x 1 this AM, chronic constipation  Physical Exam  General:  thin, eldelry female, NAD - dtr at side  Lungs:  normal respiratory  effort, no intercostal retractions or use of accessory muscles; normal breath sounds bilaterally - no crackles and no wheezes.    Heart:  normal rate, regular rhythm, no murmur, and no rub. BLE with 1++ soft edema to knees, most prminient at ankles - wearing knee high compression hose - normal DP pulses and normal cap refill in all 4 extremities    Msk:  uses a rolling walking, diffuse muscle atrophy; no gross skeletal deformaties -   Impression & Recommendations:  Problem # 1:  POLYMYALGIA RHEUMATICA (ICD-725) i do not have much data on this dx - will refer  to rheum for eval of this in setting of diffuse pain syndrome - ok to cont fentanyl as slightly higher dose until then - if nothing medical to do, will then send to painmgmt (dr. Vear Clock) as needed for optimization of various pain control modalities given problems with tolerance and constipation Time spent with patient and dtr 25 minutes, more than 50% of this time was spent counseling patient on causes for pain, modes of tx and risks of continued increasing narcotic meds Orders: Rheumatology Referral (Rheumatology)  Problem # 2:  HYPERTENSION (ICD-401.9)  Her updated medication list for this problem includes:    Lisinopril 20 Mg Tabs (Lisinopril) .[name]... Take one tablet by mouth daily    Lopressor 100 Mg Tabs (Metoprolol tartrate) .[name]... Take one tablet once daily    Furosemide 80 Mg Tabs (Furosemide) .[name]... 1 by mouth two times a day x7days, then once daily (or as directed)  BP today: 122/60 Prior BP: 128/72 (05/21/2009)  Labs Reviewed: K+: 3.9 (05/21/2009) Creat: : 1.0 (05/21/2009)   Chol: 127 (05/21/2009)   HDL: 54.50 (05/21/2009)   LDL: 50 (05/21/2009)   TG: 111.0 (05/21/2009)  Problem # 3:  EDEMA (ICD-782.3) chronic -  cont compression hose  may use two times a day lasix if weight >150#, otherwise, only once daily  Her updated medication list for this problem includes:    Furosemide 80 Mg Tabs (Furosemide) .[name]... 1 by mouth  two times a day x7days, then once daily (or as directed)  Problem # 4:  HYPOTHYROIDISM (ICD-244.9)  Her updated medication list for this problem includes:    Synthroid 25 Mcg Tabs (Levothyroxine sodium) .[name]... Take one tablet by mouth once daily.  Labs Reviewed: TSH: 2.33 (05/21/2009)    Chol: 127 (05/21/2009)   HDL: 54.50 (05/21/2009)   LDL: 50 (05/21/2009)   TG: 111.0 (05/21/2009)  Complete Medication List: 1)  Lisinopril 20 Mg Tabs (Lisinopril) .... Take one tablet by mouth daily 2)  Warfarin Sodium 3 Mg Tabs (Warfarin sodium) .... Use as directed by anticoagualtion clinic 3)  Lopressor 100 Mg Tabs (Metoprolol tartrate) .... Take one tablet once daily 4)  Synthroid 25 Mcg Tabs (Levothyroxine sodium) .... Take one tablet by mouth once daily. 5)  Lipitor 10 Mg Tabs (Atorvastatin calcium) .... Take one tablet by mouth once daily. 6)  Furosemide 80 Mg Tabs (Furosemide) .[name].. 1 by mouth two times a day x7days, then once daily (or as directed) 7)  K-tabs 10 Meq Cr-tabs (Potassium chloride) .[name].. 1 by mouth two times a day x 7days, then once daily (or as directed) 8)  Protonix 40 Mg Tbec (Pantoprazole sodium) .... Take one tablet by mouth once daily. 9)  Calcium Carbonate Powd (Calcium carbonate) .... Uad 10)  Vitamin C 500 Mg Chew (Ascorbic acid) .... Once daily 11)  Lecithin 400 Mg Caps (Lecithin) .... Take one tablet daily 12)  Vitamin D3 1000 Unit Caps (Cholecalciferol) .... 2 caps daily 13)  Folic Acid 800 Mcg Tabs (Folic acid) .... Once daily 14)  Cvs Vitamin B12 100 Mcg Tabs (Cyanocobalamin) .... Once daily 15)  Ferrous Sulfate 325 (65 Fe) Mg Tabs (Ferrous sulfate) .... 2 tablets daily 16)  Carafate 1 Gm/52ml Susp (Sucralfate) .... Use as directed 17)  Citalopram Hydrobromide 20 Mg Tabs (Citalopram hydrobromide) .... Take 1 by mouth qd 18)  Fentanyl 75 Mcg/hr Pt72 (Fentanyl) .... Change every 72 hours 19)  Co Q-10 100 Mg Caps (Coenzyme q10) .... Take 1 by mouth qd 20)  Fish Oil 300 Mg  Caps (Omega-3 fatty acids) .... Once daily 21)  Tylenol Extra Strength 500 Mg Tabs (Acetaminophen) .[name].. 1-2 by mouth evry 6 hours as needed for breakthrough pain 22)  Claritin 10 Mg Tabs (Loratadine) .[name].. 1 by mouth once daily x 7 days, then as needed for allergy/hoarseness  Patient Instructions: 1)  it was good to see you today.  2)  increase fentanyl patch to dose evey 3 days until further input from rheumatology 3)  we'll make referral to rheumatology (possible pain managment with dr. Vear Clock depending on rheumatology opinion) - Our office will contact you regarding this appointment once made.  4)  no other medication changes 5)  Please schedule a follow-up appointment in 3-4 months, sooner if problems.  Prescriptions: FENTANYL 75 MCG/HR PT72 (FENTANYL) change every 72 hours  #10 x 0   Entered and Authorized by:   Newt Lukes MD   Signed by:   Newt Lukes MD on 08/27/2009   Method used:   Print then Give to Patient   RxID:   [id number]

## 2010-06-01 NOTE — Letter (Signed)
Summary: [facility]   Imported By: Lennie Odor 02/02/2010 14:42:25  _____________________________________________________________________  External Attachment:    Type:   Image     Comment:   External Document

## 2010-06-01 NOTE — Medication Information (Signed)
Summary: rov coumadin - [facility]  Anticoagulant Therapy  Managed by: Cloyde Reams, RN, BSN Referring MD: Johney Frame MD, Fayrene Fearing PCP: Newt Lukes MD Supervising MD: Ladona Ridgel MD, Sharlot Gowda Indication 1: Atrial Fibrillation (chronic) Lab Used: [facility] [place] Site: [address] INR POC 3.2 INR RANGE 2.0-3.0  Dietary changes: no    Health status changes: no    Bleeding/hemorrhagic complications: no    Recent/future hospitalizations: no    Any changes in medication regimen? no    Recent/future dental: no  Any missed doses?: no       Is patient compliant with meds? yes      Comments: Pt states she has taken 1 tablet on Friday a couple times since last visit instead of 1/2 tablet.    Allergies (verified): 1)  ! Ultram  Anticoagulation Management History:      The patient is taking warfarin and comes in today for a routine follow up visit.  Positive risk factors for bleeding include an age of 75 years or older.  The bleeding index is 'intermediate risk'.  Positive CHADS2 values include History of CHF, History of HTN, and Age > 41 years old.  Anticoagulation responsible provider: Ladona Ridgel MD, Sharlot Gowda.  INR POC: 3.2.  Cuvette Lot#: [id number].  Exp: 08/2010.    Anticoagulation Management Assessment/Plan:      The patient's current anticoagulation dose is Warfarin sodium 3 mg tabs: Use as directed by Anticoagualtion Clinic.  The next INR is due 08/13/2009.  Anticoagulation instructions were given to patient.  Results were reviewed/authorized by Cloyde Reams, RN, BSN.  She was notified by Cloyde Reams RN.         Prior Anticoagulation Instructions: INR 2.3  Continue Coumadin 1 tab = 3mg  each day except 1/2 tab on Fri  Current Anticoagulation Instructions: INR 3.2  Take 1/2 tablet tonight, then resume same dosage 1 tablet daily except 1/2 tablet on Fridays.  Recheck in 4 weeks.

## 2010-06-01 NOTE — Medication Information (Signed)
Summary: rov/eac  Anticoagulant Therapy  Managed by: Leota Sauers, PharmD, BCPS, CPP Referring MD: Johney Frame MD, Fayrene Fearing PCP: Newt Lukes MD Supervising MD: Myrtis Ser MD, Tinnie Gens Indication 1: Atrial Fibrillation (chronic) Lab Used: [facility] [place] Site: [address] INR POC 2.3 INR RANGE 2.0-3.0  Dietary changes: no    Health status changes: no    Bleeding/hemorrhagic complications: no    Recent/future hospitalizations: no    Any changes in medication regimen? no    Recent/future dental: no  Any missed doses?: no       Is patient compliant with meds? yes       Current Medications (verified): 1)  Lisinopril 20 Mg Tabs (Lisinopril) .... Take One Tablet By Mouth Daily 2)  Warfarin Sodium 3 Mg Tabs (Warfarin Sodium) .... Use As Directed By Anticoagualtion Clinic 3)  Lopressor 100 Mg Tabs (Metoprolol Tartrate) .... Take One Tablet Once Daily 4)  Synthroid 25 Mcg Tabs (Levothyroxine Sodium) .... Take One Tablet By Mouth Once Daily. 5)  Lipitor 10 Mg Tabs (Atorvastatin Calcium) .... Take One Tablet By Mouth Once Daily. 6)  Furosemide 80 Mg Tabs (Furosemide) .[name].. 1 By Mouth Two Times A Day X7days, Then Once Daily (Or As Directed) 7)  K-Tabs 10 Meq Cr-Tabs (Potassium Chloride) .[name].. 1 By Mouth Two Times A Day X 7days, Then Once Daily (Or As Directed) 8)  Protonix 40 Mg Tbec (Pantoprazole Sodium) .... Take One Tablet By Mouth Once Daily. 9)  Calcium Carbonate   Powd (Calcium Carbonate) .... Uad 10)  Vitamin C 500 Mg Chew (Ascorbic Acid) .... Once Daily 11)  Lecithin 400 Mg Caps (Lecithin) .... Take One Tablet Daily 12)  Vitamin D3 1000 Unit Caps (Cholecalciferol) .... 2 Caps Daily 13)  Folic Acid 800 Mcg Tabs (Folic Acid) .... Once Daily 14)  Cvs Vitamin B12 100 Mcg Tabs (Cyanocobalamin) .... Once Daily 15)  Ferrous Sulfate 325 (65 Fe) Mg Tabs (Ferrous Sulfate) .... 2 Tablets Daily 16)  Carafate 1 Gm/74ml Susp (Sucralfate) .... Use As Directed 17)  Citalopram  Hydrobromide 20 Mg Tabs (Citalopram Hydrobromide) .... Take 1 By Mouth Qd 18)  Fentanyl 50 Mcg/hr Pt72 (Fentanyl) .... Use As Directed 19)  Co Q-10 100 Mg Caps (Coenzyme Q10) .... Take 1 By Mouth Qd 20)  Fish Oil 300 Mg Caps (Omega-3 Fatty Acids) .... Once Daily 21)  Tylenol Extra Strength 500 Mg Tabs (Acetaminophen) .[name].. 1-2 By Mouth Evry 6 Hours As Needed For Breakthrough Pain 22)  Claritin 10 Mg Tabs (Loratadine) .[name].. 1 By Mouth Once Daily X 7 Days, Then As Needed For Allergy/hoarseness  Allergies (verified): 1)  ! Ultram  Anticoagulation Management History:      The patient is taking warfarin and comes in today for a routine follow up visit.  Positive risk factors for bleeding include an age of 19 years or older.  The bleeding index is 'intermediate risk'.  Positive CHADS2 values include History of CHF, History of HTN, and Age > 8 years old.  Anticoagulation responsible provider: Myrtis Ser MD, Tinnie Gens.  INR POC: 2.3.  Cuvette Lot#: [id number].  Exp: 08/2010.    Anticoagulation Management Assessment/Plan:      The patient's current anticoagulation dose is Warfarin sodium 3 mg tabs: Use as directed by Anticoagualtion Clinic.  The next INR is due 07/16/2009.  Anticoagulation instructions were given to patient.  Results were reviewed/authorized by Leota Sauers, PharmD, BCPS, CPP.         Prior Anticoagulation Instructions: INR 3.5  Do  NOT take coumadin today.  Then start new dosing schedule of 1 tablet every day, except 1/2 tablet on Friday. Return to clinic in 2 weeks.  Current Anticoagulation Instructions: INR 2.3  Continue Coumadin 1 tab = 3mg  each day except 1/2 tab on Fri

## 2010-06-01 NOTE — Medication Information (Signed)
Summary: rov/cs  Anticoagulant Therapy  Managed by: Weston Brass, PharmD Referring MD: Johney Frame MD, Fayrene Fearing PCP: Newt Lukes MD Supervising MD: Johney Frame MD, Fayrene Fearing Indication 1: Atrial Fibrillation (chronic) Lab Used: [facility] [place] Site: [address] INR POC 3.5 INR RANGE 2.0-3.0  Dietary changes: no    Health status changes: yes       Details: Stomach bug/food poisoning causing vomit/diarrhea last week x3days; lost appetite  Bleeding/hemorrhagic complications: no    Recent/future hospitalizations: no    Any changes in medication regimen? no    Recent/future dental: no  Any missed doses?: no       Is patient compliant with meds? yes       Current Medications (verified): 1)  Lisinopril 20 Mg Tabs (Lisinopril) .... Take One Tablet By Mouth Daily 2)  Warfarin Sodium 3 Mg Tabs (Warfarin Sodium) .... Use As Directed By Anticoagualtion Clinic 3)  Lopressor 100 Mg Tabs (Metoprolol Tartrate) .... Take One Tablet Once Daily 4)  Synthroid 25 Mcg Tabs (Levothyroxine Sodium) .... Take One Tablet By Mouth Once Daily. 5)  Furosemide 40 Mg Tabs (Furosemide) .... Take One Tablet By Mouth Daily. 6)  K-Tabs 10 Meq Cr-Tabs (Potassium Chloride) .[name].. 1 By Mouth Once Daily 7)  Protonix 40 Mg Tbec (Pantoprazole Sodium) .... Take One Tablet By Mouth Once Daily. 8)  Lecithin 400 Mg Caps (Lecithin) .... Take One Tablet Daily 9)  Vitamin D3 1000 Unit Caps (Cholecalciferol) .[name].. 1 Daily 10)  Folic Acid 800 Mcg Tabs (Folic Acid) .... Once Daily 11)  Cvs Vitamin B12 100 Mcg Tabs (Cyanocobalamin) .... Once Daily 12)  Ferrous Sulfate 325 (65 Fe) Mg Tabs (Ferrous Sulfate) .... 21 Tab Once Daily 13)  Carafate 1 Gm/27ml Susp (Sucralfate) .... Use As Directed 14)  Citalopram Hydrobromide 20 Mg Tabs (Citalopram Hydrobromide) .... Take 1 By Mouth Qd 15)  Fentanyl 75 Mcg/hr Pt72 (Fentanyl) .... Change Every 72 Hours 16)  Co Q-10 100 Mg Caps (Coenzyme Q10) .... Take 1 By Mouth Qd 17)  Fish  Oil 300 Mg Caps (Omega-3 Fatty Acids) .... Once Daily 18)  Tylenol Extra Strength 500 Mg Tabs (Acetaminophen) .[name].. 1-2 By Mouth Evry 6 Hours As Needed For Breakthrough Pain 19)  Arava 10 Mg Tabs (Leflunomide) .... Once Daily 20)  Beta Carotene  [employer]) .... Once Daily 21)  Zinc Magnesium .... Uad 22)  Amoxicillin 250 Mg Caps (Amoxicillin) .[name].. 1 By Mouth Three Times A Day X 7 Days 23)  Zofran 4 Mg Tabs (Ondansetron Hcl) .[name].. 1 By Mouth Every 6 Hours As Needed For Nausea or Vomitting  Allergies (verified): 1)  ! Ultram  Anticoagulation Management History:      The patient is taking warfarin and comes in today for a routine follow up visit.  Positive risk factors for bleeding include an age of 39 years or older.  The bleeding index is 'intermediate risk'.  Positive CHADS2 values include History of CHF, History of HTN, and Age > 62 years old.  Today's INR is 3.5.  Anticoagulation responsible provider: Allred MD, Fayrene Fearing.  INR POC: 3.5.  Cuvette Lot#: [id number].  Exp: 03/2011.    Anticoagulation Management Assessment/Plan:      The patient's current anticoagulation dose is Warfarin sodium 3 mg tabs: Use as directed by Anticoagualtion Clinic.  The target INR is 2.0-3.0.  The next INR is due 04/01/2010.  Anticoagulation instructions were given to patient.  Results were reviewed/authorized by Weston Brass, PharmD.  Prior Anticoagulation Instructions: INR 2.1  Continue Coumadin 3 mg (1 tablet) every day except 1.5mg  (0.5 tablet) on Monday, Wednesday, and Friday.  Return to clinic 4 weeks.    Current Anticoagulation Instructions: INR 3.5  No dose tonight then continue normal dose schedule 1 tablet everyday except 0.5 tablets on Mondays and Wednesdays.

## 2010-06-01 NOTE — Progress Notes (Signed)
Summary: UTI/Bladder Inf  Phone Note Call from Patient Call back at Home Phone [phone]   Caller: Patient Summary of Call: Pt called stating she has been experiencing mild RT flank pain, frequent urination with decreased volume and urine has slight odor. Pt believes she has a UTI or Bladder infection and is requesting refill of recent ABX, please advise. Initial call taken by: Margaret Pyle, CMA,  April 02, 2010 10:25 AM  Follow-up for Phone Call        this is complicated due to recent problems with diarrhea - however, given recent Ucx results with UTI symptoms, will rx omnicef 300mg  two times a day x 5 days for UTI - needs to also take align two times a day while on abx but take at least 2 h after abx pill - call for OV if symptoms worse or unimproved after abx Follow-up by: Newt Lukes MD,  April 02, 2010 10:44 AM  Additional Follow-up for Phone Call Additional follow up Details #1::        left message on machine for pt to return my call. Margaret Pyle, CMA  April 02, 2010 1:32 PM  Pt informed in detail, expressed undertstanding Additional Follow-up by: Margaret Pyle, CMA,  April 02, 2010 2:40 PM    New/Updated Medications: CEFDINIR 300 MG CAPS (CEFDINIR) 1 by mouth two times a day x 5 days Prescriptions: CEFDINIR 300 MG CAPS (CEFDINIR) 1 by mouth two times a day x 5 days  #10 x 0   Entered and Authorized by:   Newt Lukes MD   Signed by:   Newt Lukes MD on 04/02/2010   Method used:   Electronically to        [facility] # [phone]* (retail)       [address] [place], [place]  [address]       Ph: [phone]       Fax: [phone]   RxID:   [id number]

## 2010-06-01 NOTE — Medication Information (Signed)
Summary: rov/tm  Anticoagulant Therapy  Managed by: Eda Keys, PharmD Referring MD: Johney Frame MD, Fayrene Fearing PCP: Newt Lukes MD Supervising MD: Juanda Chance MD, Mariadelosang Wynns Indication 1: Atrial Fibrillation (chronic) Lab Used: [facility] [place] Site: [address] INR POC 3.5 INR RANGE 2.0-3.0  Dietary changes: no    Health status changes: no    Bleeding/hemorrhagic complications: no    Recent/future hospitalizations: no    Any changes in medication regimen? no    Recent/future dental: no  Any missed doses?: no       Is patient compliant with meds? yes       Current Medications (verified): 1)  Lisinopril 20 Mg Tabs (Lisinopril) .... Take One Tablet By Mouth Daily 2)  Warfarin Sodium 3 Mg Tabs (Warfarin Sodium) .... Use As Directed By Anticoagualtion Clinic 3)  Lopressor 100 Mg Tabs (Metoprolol Tartrate) .... Take One Tablet Once Daily 4)  Synthroid 25 Mcg Tabs (Levothyroxine Sodium) .... Take One Tablet By Mouth Once Daily. 5)  Lipitor 10 Mg Tabs (Atorvastatin Calcium) .... Take One Tablet By Mouth Once Daily. 6)  Furosemide 80 Mg Tabs (Furosemide) .[name].. 1 By Mouth Two Times A Day X7days, Then Once Daily (Or As Directed) 7)  K-Tabs 10 Meq Cr-Tabs (Potassium Chloride) .[name].. 1 By Mouth Two Times A Day X 7days, Then Once Daily (Or As Directed) 8)  Protonix 40 Mg Tbec (Pantoprazole Sodium) .... Take One Tablet By Mouth Once Daily. 9)  Calcium Carbonate   Powd (Calcium Carbonate) .... Uad 10)  Vitamin C 500 Mg Chew (Ascorbic Acid) .... Once Daily 11)  Lecithin 400 Mg Caps (Lecithin) .... Take One Tablet Daily 12)  Vitamin D3 1000 Unit Caps (Cholecalciferol) .... 2 Caps Daily 13)  Folic Acid 800 Mcg Tabs (Folic Acid) .... Once Daily 14)  Cvs Vitamin B12 100 Mcg Tabs (Cyanocobalamin) .... Once Daily 15)  Ferrous Sulfate 325 (65 Fe) Mg Tabs (Ferrous Sulfate) .... 2 Tablets Daily 16)  Carafate 1 Gm/41ml Susp (Sucralfate) .... Use As Directed 17)  Citalopram Hydrobromide  20 Mg Tabs (Citalopram Hydrobromide) .... Take 1 By Mouth Qd 18)  Fentanyl 50 Mcg/hr Pt72 (Fentanyl) .... Use As Directed 19)  Co Q-10 100 Mg Caps (Coenzyme Q10) .... Take 1 By Mouth Qd 20)  Fish Oil 300 Mg Caps (Omega-3 Fatty Acids) .... Once Daily 21)  Tylenol Extra Strength 500 Mg Tabs (Acetaminophen) .[name].. 1-2 By Mouth Evry 6 Hours As Needed For Breakthrough Pain 22)  Claritin 10 Mg Tabs (Loratadine) .[name].. 1 By Mouth Once Daily X 7 Days, Then As Needed For Allergy/hoarseness  Allergies (verified): 1)  ! Ultram  Anticoagulation Management History:      The patient is taking warfarin and comes in today for a routine follow up visit.  Positive risk factors for bleeding include an age of 75 years or older.  The bleeding index is 'intermediate risk'.  Positive CHADS2 values include History of CHF, History of HTN, and Age > 41 years old.  Anticoagulation responsible provider: Juanda Chance MD, Smitty Cords.  INR POC: 3.5.  Cuvette Lot#: [id number].  Exp: 08/2010.    Anticoagulation Management Assessment/Plan:      The patient's current anticoagulation dose is Warfarin sodium 3 mg tabs: Use as directed by Anticoagualtion Clinic.  The next INR is due 06/18/2009.  Anticoagulation instructions were given to patient.  Results were reviewed/authorized by Eda Keys, PharmD.  She was notified by Eda Keys.         Prior Anticoagulation Instructions: INR  2.9 Continue 3mg s daily. Recheck in 4 weeks.   Current Anticoagulation Instructions: INR 3.5  Do NOT take coumadin today.  Then start new dosing schedule of 1 tablet every day, except 1/2 tablet on Friday. Return to clinic in 2 weeks.

## 2010-06-01 NOTE — Progress Notes (Signed)
Summary: Rx refill req  Phone Note Call from Patient Call back at Home Phone [phone]   Caller: Patient Summary of Call: pt called requesting refill of Fentanyl patches. Pt states that she did have appt with Dr Dareen Piano but was unable to go because her daughter was out of town and unable to take her. Pt says she will be back in town mid-June and she will reschedule appt for then. please advise Initial call taken by: Margaret Pyle, CMA,  Sep 24, 2009 11:26 AM  Follow-up for Phone Call        I will refill medication this time but pt needs to schedule and keep followup with rheum Dareen Piano) as we discussed at her OV - i will also make referral to pain clinic (dr. Vear Clock) as we discussed at previous OV - pt will need to see dr. Edmon Crape clinic regardless of what dr. Dareen Piano decides so that we can optimize her pain control - thanks Follow-up by: Newt Lukes MD,  Sep 24, 2009 11:37 AM  Additional Follow-up for Phone Call Additional follow up Details #1::        pt informed and will ask neighbor to pick up RX. Rx in cabinet for pt pick up Additional Follow-up by: Margaret Pyle, CMA,  Sep 24, 2009 11:49 AM    Prescriptions: FENTANYL 75 MCG/HR PT72 (FENTANYL) change every 72 hours  #10 x 0   Entered and Authorized by:   Newt Lukes MD   Signed by:   Newt Lukes MD on 09/24/2009   Method used:   Print then Give to Patient   RxID:   [id number]

## 2010-06-01 NOTE — Medication Information (Signed)
Summary: [employer] [facility]   Imported By: Kassie Mends 06/15/2009 08:54:10  _____________________________________________________________________  External Attachment:    Type:   Image     Comment:   External Document

## 2010-06-01 NOTE — Procedures (Signed)
Summary: [facility]   Current Medications (verified): 1)  Lisinopril 20 Mg Tabs (Lisinopril) .... Take One Tablet By Mouth Daily 2)  Warfarin Sodium 3 Mg Tabs (Warfarin Sodium) .... Use As Directed By Anticoagualtion Clinic 3)  Lopressor 100 Mg Tabs (Metoprolol Tartrate) .... Take One Tablet Once Daily 4)  Synthroid 25 Mcg Tabs (Levothyroxine Sodium) .... Take One Tablet By Mouth Once Daily. 5)  Lipitor 10 Mg Tabs (Atorvastatin Calcium) .... Take One Tablet By Mouth Once Daily. 6)  Furosemide 80 Mg Tabs (Furosemide) .... One By Mouth Daily 7)  K-Tabs 10 Meq Cr-Tabs (Potassium Chloride) .[name].. 1 By Mouth Two Times A Day X 7days, Then Once Daily (Or As Directed) 8)  Protonix 40 Mg Tbec (Pantoprazole Sodium) .... Take One Tablet By Mouth Once Daily. 9)  Lecithin 400 Mg Caps (Lecithin) .... Take One Tablet Daily 10)  Vitamin D3 1000 Unit Caps (Cholecalciferol) .... 2 Caps Daily 11)  Folic Acid 800 Mcg Tabs (Folic Acid) .... Once Daily 12)  Cvs Vitamin B12 100 Mcg Tabs (Cyanocobalamin) .... Once Daily 13)  Ferrous Sulfate 325 (65 Fe) Mg Tabs (Ferrous Sulfate) .... 2 Tablets Daily 14)  Carafate 1 Gm/44ml Susp (Sucralfate) .... Use As Directed 15)  Citalopram Hydrobromide 20 Mg Tabs (Citalopram Hydrobromide) .... Take 1 By Mouth Qd 16)  Fentanyl 75 Mcg/hr Pt72 (Fentanyl) .... Change Every 72 Hours 17)  Co Q-10 100 Mg Caps (Coenzyme Q10) .... Take 1 By Mouth Qd 18)  Fish Oil 300 Mg Caps (Omega-3 Fatty Acids) .... Once Daily 19)  Tylenol Extra Strength 500 Mg Tabs (Acetaminophen) .[name].. 1-2 By Mouth Evry 6 Hours As Needed For Breakthrough Pain  Allergies (verified): 1)  ! Ultram  PPM Specifications Following MD:  Hillis Range, MD     PPM Vendor:  [employer]     PPM Model Number:  [id number]     PPM Serial Number:  [id number] PPM DOI:  11/25/2005     PPM Implanting MD:  NOT IMPLANTED HERE  Lead 1    Location: RA     DOI: 11/25/2005     Model #: 4135     Serial #: [id number]     Status:  active Lead 2    Location: RV     DOI: 11/25/2005     Model #: 4136     Serial #: [id number]     Status: active  Magnet Response Rate:  BOL100 ERI  85  Indications:  AFIB   PPM Follow Up Remote Check?  No Battery Voltage:  good V     Battery Est. Longevity:  4.5 years     Pacer Dependent:  Yes       PPM Device Measurements Atrium  Amplitude: 1.5 mV, Impedance: 560 ohms, Threshold: 0.6 V at 0.5 msec Right Ventricle  Amplitude: 10.5 mV, Impedance: 500 ohms, Threshold: 0.8 V at 0.5 msec  Episodes MS Episodes:  11     Percent Mode Switch:  1%     Coumadin:  Yes Atrial Pacing:  57%     Ventricular Pacing:  2%  Parameters Mode:  DDDR     Lower Rate Limit:  60     Upper Rate Limit:  130 Paced AV Delay:  210     Sensed AV Delay:  300 Next Cardiology Appt Due:  05/02/2010 Tech Comments:  No parameter changes.  No A-fib since 06/18/09, + coumadin.  ROV 6 months with Dr. Johney Frame. Altha Harm, LPN  November 24, 2009  8:50 AM

## 2010-06-01 NOTE — Letter (Signed)
Summary: [facility]  [facility]   Imported By: Lennie Odor 11/20/2009 11:55:16  _____________________________________________________________________  External Attachment:    Type:   Image     Comment:   External Document

## 2010-06-01 NOTE — Assessment & Plan Note (Signed)
Summary: f69m/jss   Visit Type:  Follow-up Primary Provider:  Newt Lukes MD   History of Present Illness: The patient presents today for electrophysiology followup after her recent hospitalization for atypical chest pain.  She reports doing well since that time.  She has had no further chest pain.  She has returned to her usual activity.   The patient denies symptoms of palpitations, shortness of breath, orthopnea, PND, dizziness, presyncope, syncope, or neurologic sequela.  She has chronic BLE edema due to venous insufficiency.  The patient is tolerating medications without difficulties and is otherwise without complaint today.   Current Medications (verified): 1)  Lisinopril 20 Mg Tabs (Lisinopril) .... Take One Tablet By Mouth Daily 2)  Warfarin Sodium 3 Mg Tabs (Warfarin Sodium) .... Use As Directed By Anticoagualtion Clinic 3)  Lopressor 100 Mg Tabs (Metoprolol Tartrate) .... Take One Tablet Once Daily 4)  Synthroid 25 Mcg Tabs (Levothyroxine Sodium) .... Take One Tablet By Mouth Once Daily. 5)  Lipitor 10 Mg Tabs (Atorvastatin Calcium) .... Take One Tablet By Mouth Once Daily. 6)  Furosemide 80 Mg Tabs (Furosemide) .... One By Mouth Daily 7)  K-Tabs 10 Meq Cr-Tabs (Potassium Chloride) .[name].. 1 By Mouth Once Daily 8)  Protonix 40 Mg Tbec (Pantoprazole Sodium) .... Take One Tablet By Mouth Once Daily. 9)  Lecithin 400 Mg Caps (Lecithin) .... Take One Tablet Daily 10)  Vitamin D3 1000 Unit Caps (Cholecalciferol) .[name].. 1 Daily 11)  Folic Acid 800 Mcg Tabs (Folic Acid) .... Once Daily 12)  Cvs Vitamin B12 100 Mcg Tabs (Cyanocobalamin) .... Once Daily 13)  Ferrous Sulfate 325 (65 Fe) Mg Tabs (Ferrous Sulfate) .... 21 Tab Once Daily 14)  Carafate 1 Gm/59ml Susp (Sucralfate) .... Use As Directed 15)  Citalopram Hydrobromide 20 Mg Tabs (Citalopram Hydrobromide) .... Take 1 By Mouth Qd 16)  Fentanyl 75 Mcg/hr Pt72 (Fentanyl) .... Change Every 72 Hours 17)  Co Q-10 100 Mg Caps (Coenzyme  Q10) .... Take 1 By Mouth Qd 18)  Fish Oil 300 Mg Caps (Omega-3 Fatty Acids) .... Once Daily 19)  Tylenol Extra Strength 500 Mg Tabs (Acetaminophen) .[name].. 1-2 By Mouth Evry 6 Hours As Needed For Breakthrough Pain 20)  Arava 10 Mg Tabs (Leflunomide) .... Once Daily 21)  Beta Carotene  C[employer]) .... Once Daily 22)  Zinc Magnesium .... Uad  Allergies (verified): 1)  ! Ultram  Past History:  Past Medical History: Reviewed history from 08/27/2009 and no changes required. 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 Spinal stenosis GERD Hyperlipidemia Hypertension Hypothyroidism  MD rooster:  cards - Wanisha Shiroma rheum -  Past Surgical History: Reviewed history from 03/19/2009 and no changes required. Breast biopsy x 3 Cholecystectomy 04/06/1999 Partial hysterectomy 1970 Left hip arthropasty 1995 Right hip arthroplasy, 01/2001 Bilateral carpul tunnel release 1998 Lt breast lumpectomy (ductal CA) 12/2006 Lt inferior parathyroidectomy 07/2008  Social History: Reviewed history from 05/21/2009 and no changes required. Lives at [facility] since 03/2009.   married, lives with spouse moved here from [place] to be near her son.   Retired Futures trader.  Denies tobacco.  Rare ETOH.  Denies drugs.  Review of Systems       All systems are reviewed and negative except as listed in the HPI.   Vital  Signs:  Patient profile:   77 year old female Height:      62 inches Weight:      151 pounds Pulse rate:   59 / minute BP sitting:   156 / 61  (left arm)  Vitals Entered By: Laurance Flatten CMA (November 27, 2009 11:51 AM)  Physical Exam  General:  elderly female, NAD Head:   normocephalic and atraumatic Eyes:  PERRLA/EOM intact; conjunctiva and lids normal. Mouth:  Teeth, gums and palate normal. Oral mucosa normal. Neck:  supple,  JVP flat Chest Wall:  pacemaker site is well healed Lungs:  Clear bilaterally to auscultation and percussion. Heart:  Non-displaced PMI, chest non-tender; regular rate and rhythm, S1, S2 without murmurs, rubs or gallops. Carotid upstroke normal, no bruit. Normal abdominal aortic size, no bruits. Femorals normal pulses, no bruits. Pedals normal pulses. No edema, no varicosities. Abdomen:  Bowel sounds positive; abdomen soft and non-tender without masses, organomegaly, or hernias noted. No hepatosplenomegaly. Msk:  Back normal, normal gait. Muscle strength and tone normal. Pulses:  pulses normal in all 4 extremities Extremities:  2+ BLE pedal edema with venous stasis changes Neurologic:  Alert and oriented x 3. Skin:  Intact without lesions or rashes.   Echocardiogram  Procedure date:  05/07/2009  Findings:       Study Conclusions    - Left ventricle: The cavity size was normal. Wall thickness was     normal. Systolic function was normal. The estimated ejection     fraction was in the range of 60% to 65%.   - Mitral valve: Mild regurgitation.   - Tricuspid valve: Moderate regurgitation.   - Pulmonary arteries: PA peak pressure: 46mm Hg (S).   Transthoracic echocardiography. M-mode, complete 2D, spectral   Doppler, and color Doppler. Height: Height: 162.6cm. Height: 64in.   Weight: Weight: 67.7kg. Weight: 149lb. Body mass index: BMI:   25.6kg/m^2. Body surface area: BSA: 1.68m^2. Blood pressure: 122/60.   Patient status: Outpatient. Location: [place] [facility]  Left atrium                        gradient, S   AP dim           36 mm     ------  Systemic veins   AP dim index   2.04 cm/m^2 <2.2    Estimated CVP     5 mm Hg  ------                                      Right ventricle                                      Pressure,  S      46 mm Hg  <30    --------------------------------------------------------------------   Prepared and Electronically Authenticated by    Dietrich Pates, MD   2011-01-06T22:[id number]    Nuclear Study  Procedure date:  11/24/2009  Findings:      normal lexiscan myoview  PPM Specifications Following MD:  Hillis Range, MD     PPM Vendor:  [facility] [employer]     PPM Model Number:  [id number]     PPM Serial Number:  [id number] DOI:  11/25/2005     PPM Implanting MD:  NOT IMPLANTED HERE  Lead 1    Location: RA     DOI: 11/25/2005     Model #: 4135     Serial #: [id number]     Status: active Lead 2    Location: RV     DOI: 11/25/2005     Model #: 4136     Serial #: [id number]     Status: active   Indications:  AFIB   PPM Follow Up Pacer Dependent:  No      Episodes Coumadin:  Yes  Parameters Mode:  DDDR     Lower Rate Limit:  60     Upper Rate Limit:  130 Paced AV Delay:  210     Sensed AV Delay:  300  Impression & Recommendations:  Problem # 1:  CHEST PAIN, ATYPICAL (ICD-786.59) normal myoview 11/24/09 no changes today  Problem # 2:  EDEMA (ICD-782.3) The patient's edema is due to venous insufficiency.  She is actually volume deplete on exam and by labs recently. I have decreased lasix today to 40mg  daily.  Salt restriction and daily weights are advised.  Problem # 3:  ATRIAL FIBRILLATION (ICD-427.31) stable continue coumadin  Problem # 4:  HYPERTENSION (ICD-401.9) lipitor recently stopped due to elevated CK in the hospital.  She carries a h/o polymalgia rheumatica. We will repeat CK, lipids, and LFTs upon return and then consider whether or not to restart her statin.  Problem # 5:  HYPERTENSION (ICD-401.9) above goal, though pt feels BP is controlled at home I have instructed salt restriction She will journal BP readings at home and bring them with her upon return.  If BP remains elevated, we will consider more aggressive medical therapy (potentially adding spironolactone or  an ARB)  Problem # 6:  SICK SINUS SYNDROME (ICD-427.81) recent pacemaker check is reviewed no changes at this time  Patient Instructions: 1)  Your physician recommends that you schedule a follow-up appointment in: 4 weeks. 2)  Your physician has recommended you make the following change in your medication: Decrease furosemide to 40mg  once daily. Stop lipitor. 3)  Your physician has requested that you limit the intake of sodium (salt) in your diet to four grams daily. Please see [facility] handout. 4)  Check BP at home

## 2010-06-01 NOTE — Progress Notes (Signed)
Summary: referral   Phone Note Call from Patient Call back at Home Phone [phone]   Caller: Daughter Britta Mccreedy  Summary of Call: pt's daughter called requesting referral to Dr.Koch for hearing testing for possible hearing aids. Dr.Koch fax [phone] Initial call taken by: Margaret Pyle, CMA,  June 24, 2009 9:41 AM  Follow-up for Phone Call        done - thanks Follow-up by: Newt Lukes MD,  June 24, 2009 9:45 AM  Additional Follow-up for Phone Call Additional follow up Details #1::        Dr.Koch fax [phone] Additional Follow-up by: Shelbie Proctor,  June 25, 2009 1:19 PM  New Problems: HEARING LOSS (ICD-389.9)   New Problems: HEARING LOSS (ICD-389.9)

## 2010-06-01 NOTE — Medication Information (Signed)
Summary: rov  js  Anticoagulant Therapy  Managed by: Bethena Midget, RN, BSN Referring MD: Johney Frame MD, Fayrene Fearing PCP: Newt Lukes MD Supervising MD: Ladona Ridgel MD, Sharlot Gowda Indication 1: Atrial Fibrillation (chronic) Lab Used: [facility] [place] Site: [address] INR POC 2.6 INR RANGE 2.0-3.0  Dietary changes: no    Health status changes: no    Bleeding/hemorrhagic complications: no    Recent/future hospitalizations: no    Any changes in medication regimen? no    Recent/future dental: no  Any missed doses?: no       Is patient compliant with meds? yes       Allergies: 1)  ! Ultram  Anticoagulation Management History:      The patient is taking warfarin and comes in today for a routine follow up visit.  Positive risk factors for bleeding include an age of 75 years or older.  The bleeding index is 'intermediate risk'.  Positive CHADS2 values include History of CHF, History of HTN, and Age > 46 years old.  Anticoagulation responsible provider: Ladona Ridgel MD, Sharlot Gowda.  INR POC: 2.6.  Cuvette Lot#: [id number].  Exp: 01/2011.    Anticoagulation Management Assessment/Plan:      The patient's current anticoagulation dose is Warfarin sodium 3 mg tabs: Use as directed by Anticoagualtion Clinic.  The target INR is 2.0-3.0.  The next INR is due 12/03/2009.  Anticoagulation instructions were given to patient.  Results were reviewed/authorized by Bethena Midget, RN, BSN.  She was notified by Bethena Midget, RN, BSN.         Prior Anticoagulation Instructions: The patient is to continue with the same dose of coumadin.  This dosage includes: one whole tablet every day except a half of a tablet on Mondays, Wednesdays and Fridays.  Slightly reduce intake of dark green leafy vegetables.  Current Anticoagulation Instructions: INR 2.6 Continue 3mg s everyday except 1.5mg s on Mondays, Wednesdays and Fridays. Recheck in 4 weeks.

## 2010-06-01 NOTE — Assessment & Plan Note (Signed)
Summary: NAUSEA/ DIARRHEA/ VAL'S PT /NWS   Vital Signs:  Patient profile:   77 year old female Height:      64 inches Weight:      151.25 pounds BMI:     26.06 O2 Sat:      92 % on Room air Temp:     99.5 degrees F oral Pulse rate:   77 / minute BP sitting:   102 / 62  (left arm) Cuff size:   regular  Vitals Entered By: Zella Ball Ewing CMA Duncan Dull) (March 10, 2010 2:51 PM)  O2 Flow:  Room air CC: Nausea and Diarrhea/RE   Primary Care Provider:  Newt Lukes MD  CC:  Nausea and Diarrhea/RE.  History of Present Illness: 77yo F pt of Dr Felicity Coyer here with son with c/o ongoing n/v and diarrhea/loose stools despite the reduction in amoxil last visit;  pt has some decreased appetitie, and mild dizziness to stand., lost 4 lbs since last vist, has noticed her chronic bilat LE edema less, and fortunately has not been taking the lasix in the last 4 days, and also stopped the amoxil 3 days ago as well.  Has relatviely little in the way of pain, except for marked pain to the right costal margin anteriorly this am after a 4 hr episode last PM of concomitant n/v and diarrhea at the same time;  no abd distension, no frank bleeding or blood loss though the INR was slightly elev with her recent illness approx 3.5 , when usually it is in the 2's very often without much lability. No headache, ST, cough and Pt denies CP, worsening sob, doe, wheezing, orthopnea, pnd, worsening LE edema, palps, dizziness or syncope  Pt denies new neuro symptoms such as headache, facial or extremity weakness  No chills, back pain, GU symtpoms such as dysuria, freq, urgency or hematuria, and no hx of c diff.  Did take 2 immodium this am at 6 am and kept it down, so no further diarrhea today so far. No rash or acute joint problems  No confusion, falls or injury  Problems Prior to Update: 1)  Diarrhea  (ICD-787.91) 2)  Viral Gastroenteritis  (ICD-008.8) 3)  Uti  (ICD-599.0) 4)  Chest Pain, Atypical  (ICD-786.59) 5)   Hearing Loss  (ICD-389.9) 6)  CHF  (ICD-428.0) 7)  Edema  (ICD-782.3) 8)  Hoarseness  (ICD-784.42) 9)  Atrial Fibrillation  (ICD-427.31) 10)  Hypertension  (ICD-401.9) 11)  Hyperlipidemia  (ICD-272.4) 12)  Hypothyroidism  (ICD-244.9) 13)  Gerd  (ICD-530.81) 14)  Arthritis  (ICD-716.90) 15)  Urinary Incontinence  (ICD-788.30) 16)  Hyperparathyroidism  (ICD-V12.2) 17)  Pulmonary Hypertension  (ICD-416.8) 18)  Mitral Valve Insuff&aortic Valve Insuff  (ICD-396.3) 19)  Cardiomegaly  (ICD-429.3) 20)  Pacemaker, Permanent  (ICD-V45.01) 21)  Sick Sinus Syndrome  (ICD-427.81) 22)  Rheumatoid Arthritis  (ICD-714.0) 23)  Breast Cancer  (ICD-174.9) 24)  Family History Diabetes 1st Degree Relative  (ICD-V18.0) 25)  Chickenpox, Hx of  (ICD-V15.9) 26)  Uti's, Hx of  (ICD-V13.00)  Medications Prior to Update: 1)  Lisinopril 20 Mg Tabs (Lisinopril) .... Take One Tablet By Mouth Daily 2)  Warfarin Sodium 3 Mg Tabs (Warfarin Sodium) .... Use As Directed By Anticoagualtion Clinic 3)  Lopressor 100 Mg Tabs (Metoprolol Tartrate) .... Take One Tablet Once Daily 4)  Synthroid 25 Mcg Tabs (Levothyroxine Sodium) .... Take One Tablet By Mouth Once Daily. 5)  Furosemide 40 Mg Tabs (Furosemide) .... Take One Tablet By Mouth Daily. 6)  K-Tabs 10 Meq Cr-Tabs (Potassium Chloride) .[name].. 1 By Mouth Once Daily 7)  Protonix 40 Mg Tbec (Pantoprazole Sodium) .... Take One Tablet By Mouth Once Daily. 8)  Lecithin 400 Mg Caps (Lecithin) .... Take One Tablet Daily 9)  Vitamin D3 1000 Unit Caps (Cholecalciferol) .[name].. 1 Daily 10)  Folic Acid 800 Mcg Tabs (Folic Acid) .... Once Daily 11)  Cvs Vitamin B12 100 Mcg Tabs (Cyanocobalamin) .... Once Daily 12)  Ferrous Sulfate 325 (65 Fe) Mg Tabs (Ferrous Sulfate) .... 21 Tab Once Daily 13)  Carafate 1 Gm/24ml Susp (Sucralfate) .... Use As Directed 14)  Citalopram Hydrobromide 20 Mg Tabs (Citalopram Hydrobromide) .... Take 1 By Mouth Qd 15)  Fentanyl 75 Mcg/hr Pt72 (Fentanyl)  .... Change Every 72 Hours 16)  Co Q-10 100 Mg Caps (Coenzyme Q10) .... Take 1 By Mouth Qd 17)  Fish Oil 300 Mg Caps (Omega-3 Fatty Acids) .... Once Daily 18)  Tylenol Extra Strength 500 Mg Tabs (Acetaminophen) .[name].. 1-2 By Mouth Evry 6 Hours As Needed For Breakthrough Pain 19)  Arava 10 Mg Tabs (Leflunomide) .... Once Daily 20)  Beta Carotene  Crys (Bulk [employer]s) .... Once Daily 21)  Zinc Magnesium .... Uad 22)  Zofran 4 Mg Tabs (Ondansetron Hcl) .[name].. 1 By Mouth Every 6 Hours As Needed For Nausea or Vomitting  Current Medications (verified): 1)  Lisinopril 20 Mg Tabs (Lisinopril) .... Take One Tablet By Mouth Daily 2)  Warfarin Sodium 3 Mg Tabs (Warfarin Sodium) .... Use As Directed By Anticoagualtion Clinic 3)  Lopressor 100 Mg Tabs (Metoprolol Tartrate) .... Take One Tablet Once Daily 4)  Synthroid 25 Mcg Tabs (Levothyroxine Sodium) .... Take One Tablet By Mouth Once Daily. 5)  Furosemide 40 Mg Tabs (Furosemide) .... Take One Tablet By Mouth Daily. 6)  K-Tabs 10 Meq Cr-Tabs (Potassium Chloride) .[name].. 1 By Mouth Once Daily 7)  Protonix 40 Mg Tbec (Pantoprazole Sodium) .... Take One Tablet By Mouth Once Daily. 8)  Lecithin 400 Mg Caps (Lecithin) .... Take One Tablet Daily 9)  Vitamin D3 1000 Unit Caps (Cholecalciferol) .[name].. 1 Daily 10)  Folic Acid 800 Mcg Tabs (Folic Acid) .... Once Daily 11)  Cvs Vitamin B12 100 Mcg Tabs (Cyanocobalamin) .... Once Daily 12)  Ferrous Sulfate 325 (65 Fe) Mg Tabs (Ferrous Sulfate) .... 21 Tab Once Daily 13)  Carafate 1 Gm/60ml Susp (Sucralfate) .... Use As Directed 14)  Citalopram Hydrobromide 20 Mg Tabs (Citalopram Hydrobromide) .... Take 1 By Mouth Qd 15)  Fentanyl 75 Mcg/hr Pt72 (Fentanyl) .... Change Every 72 Hours 16)  Co Q-10 100 Mg Caps (Coenzyme Q10) .... Take 1 By Mouth Qd 17)  Fish Oil 300 Mg Caps (Omega-3 Fatty Acids) .... Once Daily 18)  Tylenol Extra Strength 500 Mg Tabs (Acetaminophen) .[name].. 1-2 By Mouth Evry 6 Hours As Needed For Breakthrough  Pain 19)  Arava 10 Mg Tabs (Leflunomide) .... Once Daily 20)  Beta Carotene  Crys ([employer]) .... Once Daily 21)  Zinc Magnesium .... Uad 22)  Zofran 4 Mg Tabs (Ondansetron Hcl) .[name].. 1 By Mouth Every 6 Hours As Needed For Nausea or Vomitting 23)  Metronidazole 250 Mg Tabs (Metronidazole) .[name].. 1 By Mouth Q 6 Hrs For 10 Days  Allergies (verified): 1)  ! 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 Spinal stenosis GERD Hyperlipidemia - abn CK on lipitor - stopped 10/2009 Hypertension Hypothyroidism  MD roster:  cards - allred rheum Dareen Piano  Past Surgical History: Last updated: 03/19/2009 Breast biopsy x 3 Cholecystectomy 04/06/1999 Partial hysterectomy 1970 Left hip arthropasty 1995 Right hip arthroplasy, 01/2001 Bilateral carpul tunnel release 1998 Lt breast lumpectomy (ductal CA) 12/2006 Lt inferior parathyroidectomy 07/2008  Social History: Last updated: 03/02/2010 Lives at [facility] since 03/2009.   married, lives with spouse  moved here from [place] to be near her son.   Retired Futures trader.  Denies tobacco.  Rare ETOH.  Denies drugs.  Risk Factors: Alcohol Use: <1 (05/21/2009) Exercise: no (05/21/2009)  Risk Factors: Smoking Status: never (05/21/2009)  Review of Systems       all otherwise negative per pt -    Physical Exam  General:  alert and well-developed.  , somewhat weak and frail, but able to get up on exam table with mild assist Head:  normocephalic and atraumatic.   Eyes:  vision grossly intact, pupils equal, and pupils round.   Ears:  R ear normal  and L ear normal.   Nose:  no external deformity and no nasal discharge.   Mouth:  no gingival abnormalities and pharynx pink and moist.   Neck:  supple and no masses.   Lungs:  normal respiratory effort and normal breath sounds.   Heart:  normal rate and regular rhythm.   Abdomen:  soft, normal bowel sounds, no distention, no masses, no guarding, and no rebound tenderness. and very mild diffuse tender only, not worse to the low mid abdomen   Msk:  tender right lower lateral rib costal margin Extremities:  1-2+ bilat edema, less than usual for her   Impression & Recommendations:  Problem # 1:  DIARRHEA (ICD-787.91)  not improved with reducing the amoxil and in fact persists off the amoxil for the last 4 days, last PM with rather severe episode of concomitant n/v and diarrhea for 4 hrs nonstop;  exam today with low grade temp, and tender right costal margin but o/w relatviely benign exam -   will have to assume possible c diff vs viral illness vs antibx assoc -  for now will check labs, including stool c diff, empiric metronidazole course, stop the amoxil, f/u with primary MD   Orders: TLB-BMP (Basic Metabolic Panel-BMET) (80048-METABOL) TLB-CBC Platelet - w/Differential (85025-CBCD) TLB-Hepatic/Liver Function Pnl (80076-HEPATIC) T-Clostridium difficile Toxin A/B (16109-60454)  Problem # 2:  UTI (ICD-599.0)  recent, will check f/u urine cx but appears to have resolved  Orders: T-Culture, Urine (09811-91478) TLB-Udip w/ Micro (81001-URINE)  Her updated medication list for this problem includes:    Metronidazole 250 Mg Tabs (Metronidazole) .[name]... 1 by mouth q 6 hrs for 10 days  Problem # 3:  EDEMA (ICD-782.3)  Her updated medication list for this problem includes:    Furosemide 40 Mg Tabs (Furosemide) .[name]... Take one tablet by mouth daily. chronic venous type in part at least - to hold the furosemide as she is doing until symptoms improve/resolve  Problem # 4:  HYPERTENSION  (ICD-401.9)  Her updated medication list for this problem includes:    Lisinopril 20 Mg Tabs (Lisinopril) .[name]... Take one tablet by mouth daily    Lopressor 100 Mg Tabs (Metoprolol tartrate) .[name]... Take one tablet once daily    Furosemide 40 Mg Tabs (Furosemide) .Marland KitchenMarland KitchenMarland KitchenMarland Kitchen  Take one tablet by mouth daily.  BP today: 102/62 Prior BP: 132/72 (03/02/2010)  Labs Reviewed: K+: 3.9 (05/21/2009) Creat: : 1.0 (05/21/2009)   Chol: 202 (12/28/2009)   HDL: 54.10 (12/28/2009)   LDL: 50 (05/21/2009)   TG: 120.0 (12/28/2009) stable overall by hx and exam, ok to continue meds/tx as is , except for the lasix as above  Complete Medication List: 1)  Lisinopril 20 Mg Tabs (Lisinopril) .... Take one tablet by mouth daily 2)  Warfarin Sodium 3 Mg Tabs (Warfarin sodium) .... Use as directed by anticoagualtion clinic 3)  Lopressor 100 Mg Tabs (Metoprolol tartrate) .... Take one tablet once daily 4)  Synthroid 25 Mcg Tabs (Levothyroxine sodium) .... Take one tablet by mouth once daily. 5)  Furosemide 40 Mg Tabs (Furosemide) .... Take one tablet by mouth daily. 6)  K-tabs 10 Meq Cr-tabs (Potassium chloride) .[name].. 1 by mouth once daily 7)  Protonix 40 Mg Tbec (Pantoprazole sodium) .... Take one tablet by mouth once daily. 8)  Lecithin 400 Mg Caps (Lecithin) .... Take one tablet daily 9)  Vitamin D3 1000 Unit Caps (Cholecalciferol) .[name].. 1 daily 10)  Folic Acid 800 Mcg Tabs (Folic acid) .... Once daily 11)  Cvs Vitamin B12 100 Mcg Tabs (Cyanocobalamin) .... Once daily 12)  Ferrous Sulfate 325 (65 Fe) Mg Tabs (Ferrous sulfate) .... 21 tab once daily 13)  Carafate 1 Gm/81ml Susp (Sucralfate) .... Use as directed 14)  Citalopram Hydrobromide 20 Mg Tabs (Citalopram hydrobromide) .... Take 1 by mouth qd 15)  Fentanyl 75 Mcg/hr Pt72 (Fentanyl) .... Change every 72 hours 16)  Co Q-10 100 Mg Caps (Coenzyme q10) .... Take 1 by mouth qd 17)  Fish Oil 300 Mg Caps (Omega-3 fatty acids) .... Once daily 18)  Tylenol Extra Strength  500 Mg Tabs (Acetaminophen) .[name].. 1-2 by mouth evry 6 hours as needed for breakthrough pain 19)  Arava 10 Mg Tabs (Leflunomide) .... Once daily 20)  Beta Carotene Crys (Bulk chemicals) .... Once daily 21)  Zinc Magnesium  .... Uad 22)  Zofran 4 Mg Tabs (Ondansetron hcl) .[name].. 1 by mouth every 6 hours as needed for nausea or vomitting 23)  Metronidazole 250 Mg Tabs (Metronidazole) .[name].. 1 by mouth q 6 hrs for 10 days  Patient Instructions: 1)  stop the amoxil as you have done 2)  Please go to the Lab in the basement for your blood and/or urine tests today , and stool test 3)  start the generic metronidozole as we discussed 4)  Continue all previous medications as before this visit , including the zofran as needed for nausea 5)  Ok to hold off taking the lasix (furosemide) as you have until the diarrhea and vomiting have resolved 6)  Please schedule an appointment with your primary doctor 1-2 wks Prescriptions: ZOFRAN 4 MG TABS (ONDANSETRON HCL) 1 by mouth every 6 hours as needed for nausea or vomitting  #30 x 0   Entered and Authorized by:   Corwin Levins MD   Signed by:   Corwin Levins MD on 03/10/2010   Method used:   Print then Give to Patient   RxID:   [id number] METRONIDAZOLE 250 MG TABS (METRONIDAZOLE) 1 by mouth q 6 hrs for 10 days  #40 x 0   Entered and Authorized by:   Corwin Levins MD   Signed by:   Corwin Levins MD on 03/10/2010   Method used:   Print then Give to Patient   RxID:  [id number]    Orders Added: 1)  T-Culture, Urine [[id number]] 2)  TLB-Udip w/ Micro [81001-URINE] 3)  TLB-BMP (Basic Metabolic Panel-BMET) [80048-METABOL] 4)  TLB-CBC Platelet - w/Differential [85025-CBCD] 5)  TLB-Hepatic/Liver Function Pnl [80076-HEPATIC] 6)  T-Clostridium difficile Toxin A/B [30865-78469] 7)  Est. Patient Level IV [62952]

## 2010-06-01 NOTE — Medication Information (Signed)
Summary: rov/tm  Anticoagulant Therapy  Managed by: Weston Brass, PharmD Referring MD: Johney Frame MD, Fayrene Fearing PCP: Newt Lukes MD Supervising MD: Ladona Ridgel MD, Sharlot Gowda Indication 1: Atrial Fibrillation (chronic) Lab Used: [facility] [place] Site: [address] INR POC 2.1 INR RANGE 2.0-3.0  Dietary changes: no    Health status changes: no    Bleeding/hemorrhagic complications: no    Recent/future hospitalizations: no    Any changes in medication regimen? no    Recent/future dental: no  Any missed doses?: no       Is patient compliant with meds? yes       Allergies: 1)  ! Ultram  Anticoagulation Management History:      The patient is taking warfarin and comes in today for a routine follow up visit.  Positive risk factors for bleeding include an age of 75 years or older.  The bleeding index is 'intermediate risk'.  Positive CHADS2 values include History of CHF, History of HTN, and Age > 57 years old.  Anticoagulation responsible provider: Ladona Ridgel MD, Sharlot Gowda.  INR POC: 2.1.  Cuvette Lot#: [id number].  Exp: 12/2010.    Anticoagulation Management Assessment/Plan:      The patient's current anticoagulation dose is Warfarin sodium 3 mg tabs: Use as directed by Anticoagualtion Clinic.  The target INR is 2.0-3.0.  The next INR is due 10/07/2009.  Anticoagulation instructions were given to patient.  Results were reviewed/authorized by Weston Brass, PharmD.  She was notified by Weston Brass PharmD.         Prior Anticoagulation Instructions: INR 3.1 Change dose to 1 pill everyday except 1/2 pill on Monday, Wednesdays and Fridays. Recheck in 2- 3 weeks.   Current Anticoagulation Instructions: INR 2.1  Continue same dose of 1 tablet every day except 1/2 tablet on Monday, Wednesday and Friday

## 2010-06-01 NOTE — Cardiovascular Report (Signed)
Summary: Office Visit   Office Visit   Imported By: Roderic Ovens 12/09/2009 13:23:28  _____________________________________________________________________  External Attachment:    Type:   Image     Comment:   External Document

## 2010-06-01 NOTE — Medication Information (Signed)
Summary: rov/tm  Anticoagulant Therapy  Managed by: Weston Brass, PharmD Referring MD: Johney Frame MD, Fayrene Fearing PCP: Newt Lukes MD Supervising MD: Jens Som MD, Arlys John Indication 1: Atrial Fibrillation (chronic) Lab Used: [facility] [place] Site: [address] INR POC 2.2 INR RANGE 2.0-3.0  Dietary changes: no    Health status changes: no    Bleeding/hemorrhagic complications: no    Recent/future hospitalizations: no    Any changes in medication regimen? no    Recent/future dental: no  Any missed doses?: no       Is patient compliant with meds? yes       Allergies: 1)  ! Ultram  Anticoagulation Management History:      The patient is taking warfarin and comes in today for a routine follow up visit.  Positive risk factors for bleeding include an age of 8 years or older.  The bleeding index is 'intermediate risk'.  Positive CHADS2 values include History of CHF, History of HTN, and Age > 75 years old.  Anticoagulation responsible provider: Jens Som MD, Arlys John.  INR POC: 2.2.  Cuvette Lot#: [id number].  Exp: 01/2011.    Anticoagulation Management Assessment/Plan:      The patient's current anticoagulation dose is Warfarin sodium 3 mg tabs: Use as directed by Anticoagualtion Clinic.  The target INR is 2.0-3.0.  The next INR is due 01/06/2010.  Anticoagulation instructions were given to patient.  Results were reviewed/authorized by Weston Brass, PharmD.  She was notified by Liana Gerold, PharmD Candidate.         Prior Anticoagulation Instructions: INR 2.6 Continue 3mg s everyday except 1.5mg s on Mondays, Wednesdays and Fridays. Recheck in 4 weeks.   Current Anticoagulation Instructions: INR 2.2  Continue with 1 tablet daily except 1/2 tablet Mon, Wed and Fri. Return to clinic in 4 weeks

## 2010-06-01 NOTE — Progress Notes (Signed)
Summary: Diarrhea/VAL pt  Phone Note Call from Patient Call back at Home Phone [phone]   Caller: Patient Summary of Call: Pt called stating diarrhea has returned and OTC Immodium is not helping. Pt is requesting prescription treatment to pharmacy [facility]. Initial call taken by: Margaret Pyle, CMA,  March 24, 2010 8:50 AM  Follow-up for Phone Call        I will send in one wk metronidazole due to the long holiday weekend starting and hx of improvement recently,    BUT  , if no current fever or pain or blood or n/v, Please ask pt to try Fiber suppelement as sometimes this helps as well  - such as Metamucil daily  or Citrucel  BEFORE trying the metronidazole again  will need ROV with Dr Felicity Coyer if symptoms cont to recur Follow-up by: Corwin Levins MD,  March 24, 2010 1:01 PM  Additional Follow-up for Phone Call Additional follow up Details #1::        Pt advised and expressed understanding, Rx [facility]ed to [facility] Additional Follow-up by: Margaret Pyle, CMA,  March 24, 2010 1:30 PM    New/Updated Medications: METRONIDAZOLE 250 MG TABS (METRONIDAZOLE) 1po four times per day Prescriptions: METRONIDAZOLE 250 MG TABS (METRONIDAZOLE) 1po four times per day  #28 x 0   Entered and Authorized by:   Corwin Levins MD   Signed by:   Corwin Levins MD on 03/24/2010   Method used:   Print then Give to Patient   RxID:   [id number]

## 2010-06-01 NOTE — Assessment & Plan Note (Signed)
Summary: Nicole WHEN Roberts-#-STC   Vital Signs:  Patient profile:   77 year old female Height:      62 inches (157.48 cm) Weight:      155 pounds (70.45 kg) O2 Sat:      96 % on Room air Temp:     99.4 degrees F (37.44 degrees C) oral Pulse rate:   60 / minute BP sitting:   134 / 62  (left arm) Cuff size:   regular  Vitals Entered By: Orlan Leavens RMA (January 26, 2010 11:16 AM)  O2 Flow:  Room air CC: ? UTI havw some burning when she Roberts Is Patient Diabetic? No Pain Assessment Patient in pain? no      Comments Also want flu shot   Primary Care Provider:  Newt Lukes MD  CC:  ? UTI havw some burning when she Roberts.  History of Present Illness: here for 3 month follow up-  1) arthritis - now clarified as RA after meeting with rheum 10/2009 - prev taking darvocet  until removed from market -  also on fentanyl patch for pain control - primary pain symptoms are in shoulders and prox thighs but +pain "all over everywhere" -- remotely on steroids per chart but not in long while  - no weakness, no leg or back pain (also hx spinal stenosis noted) - no joint swelling   2) HTN -reports compliance with ongoing medical treatment and no changes in medication dose or frequency. denies adverse side effects related to current therapy.   3) dyslipidemia - on fish oil but no statin due to persistingly inc CK 11/2009(lipitor stopped 10/2009 due to inc CK in hosp) - follows with cards for same  4) edema - chronic problem in both legs but reports worse since moving to [place] end of 2010- not a/w SOB or abd swelling - a/w occ weight gain into 150s# - concern kidneys or liver not working well - has new set of compression hose which seem effective per pt  5) hypothyroid - reports compliance with ongoing medical treatment and no changes in medication dose or frequency. denies adverse side effects related to current therapy. no weight changes or bowel or skin changes  also c/o dysura- inc ur  freq, esp nocturia - mild Nicole, small vol voids - denies abd pain or fever -hx same but no UTI > 5 years  Clinical Review Panels:  Immunizations   Last Tetanus Booster:  Historical (05/02/2004)   Last Flu Vaccine:  Fluvax 3+ (01/26/2010)   Last Pneumovax:  Historical (05/03/2007)  Lipid Management   Cholesterol:  202 (12/28/2009)   LDL (bad choesterol):  50 (05/21/2009)   HDL (good cholesterol):  54.10 (12/28/2009)  CBC   WBC:  4.7 (05/21/2009)   RBC:  3.29 (05/21/2009)   Hgb:  11.2 (05/21/2009)   Hct:  34.3 (05/21/2009)   Platelets:  98.0 (05/21/2009)   MCV  104.5 (05/21/2009)   MCHC  32.6 (05/21/2009)   RDW  15.4 (05/21/2009)   PMN:  40.9 (05/21/2009)   Lymphs:  43.8 (05/21/2009)   Monos:  12.6 (05/21/2009)   Eosinophils:  2.7 (05/21/2009)   Basophil:  0.0 (05/21/2009)  Complete Metabolic Panel   Glucose:  95 (05/21/2009)   Sodium:  139 (05/21/2009)   Potassium:  3.9 (05/21/2009)   Chloride:  104 (05/21/2009)   CO2:  27 (05/21/2009)   BUN:  28 (05/21/2009)   Creatinine:  1.0 (05/21/2009)   Albumin:  3.6 (12/28/2009)   Total  Protein:  6.3 (12/28/2009)   Calcium:  9.3 (05/21/2009)   Total Bili:  0.7 (12/28/2009)   Alk Phos:  53 (12/28/2009)   SGPT (ALT):  30 (12/28/2009)   SGOT (AST):  26 (12/28/2009)   Current Medications (verified): 1)  Lisinopril 20 Mg Tabs (Lisinopril) .... Take One Tablet By Mouth Daily 2)  Warfarin Sodium 3 Mg Tabs (Warfarin Sodium) .... Use As Directed By Anticoagualtion Clinic 3)  Lopressor 100 Mg Tabs (Metoprolol Tartrate) .... Take One Tablet Once Daily 4)  Synthroid 25 Mcg Tabs (Levothyroxine Sodium) .... Take One Tablet By Mouth Once Daily. 5)  Furosemide 40 Mg Tabs (Furosemide) .... Take One Tablet By Mouth Daily. 6)  K-Tabs 10 Meq Cr-Tabs (Potassium Chloride) .[name].. 1 By Mouth Once Daily 7)  Protonix 40 Mg Tbec (Pantoprazole Sodium) .... Take One Tablet By Mouth Once Daily. 8)  Lecithin 400 Mg Caps (Lecithin) .... Take One Tablet  Daily 9)  Vitamin D3 1000 Unit Caps (Cholecalciferol) .[name].. 1 Daily 10)  Folic Acid 800 Mcg Tabs (Folic Acid) .... Once Daily 11)  Cvs Vitamin B12 100 Mcg Tabs (Cyanocobalamin) .... Once Daily 12)  Ferrous Sulfate 325 (65 Fe) Mg Tabs (Ferrous Sulfate) .... 21 Tab Once Daily 13)  Carafate 1 Gm/10ml Susp (Sucralfate) .... Use As Directed 14)  Citalopram Hydrobromide 20 Mg Tabs (Citalopram Hydrobromide) .... Take 1 By Mouth Qd 15)  Fentanyl 75 Mcg/hr Pt72 (Fentanyl) .... Change Every 72 Hours 16)  Co Q-10 100 Mg Caps (Coenzyme Q10) .... Take 1 By Mouth Qd 17)  Fish Oil 300 Mg Caps (Omega-3 Fatty Acids) .... Once Daily 18)  Tylenol Extra Strength 500 Mg Tabs (Acetaminophen) .[name].. 1-2 By Mouth Evry 6 Hours As Needed For Breakthrough Pain 19)  Arava 10 Mg Tabs (Leflunomide) .... Once Daily 20)  Beta Carotene  Crys ([employer]) .... Once Daily 21)  Zinc Magnesium .... Uad  Allergies (verified): 1)  ! Ultram  Past History:  Past Medical History: Paroxysmal atrial fibrillation - on chronic anticoag with LeB CC Pacemaker implanted 2005 for SSS and syncope Cardiomegaly HYPERPARATHYROIDISM Diastolic Dysfunction Venous insufficiency with chronic dependant edema BLE ?POLYMYALGIA RHEUMATICA  previously on Prednisone   now clarified as RA 10/2009 MI 1978 and 1990s without prior cath, PCI, or CABG. Spinal stenosis GERD Hyperlipidemia - abn CK on lipitor - stopped 10/2009 Hypertension Hypothyroidism  MD roster:  cards - allred rheum - anderson  Review of Systems  The patient denies hoarseness, chest pain, syncope, dyspnea on exertion, headaches, hemoptysis, abdominal pain, melena, and severe indigestion/heartburn.    Physical Exam  General:  thin, elderly female, NAD - dtr at side  Lungs:  normal respiratory effort, no intercostal retractions or use of accessory muscles; normal breath sounds bilaterally - no crackles and no wheezes.    Heart:  normal rate, regular rhythm, no murmur, and  no rub. BLE with 1++ soft edema to knees, most prominient at ankles - wearing knee high compression hose -  Psych:  Oriented X3, memory intact for recent and remote, normally interactive, good eye contact, not anxious appearing, not depressed appearing, and not agitated.      Impression & Recommendations:  Problem # 1:  RHEUMATOID ARTHRITIS (ICD-714.0) clarified rheum dx 10/2009 by dr Dareen Piano - not PMR - now on arava - for f/u rheum 01/2010 cont same - also fentanyl patch as ongoing  Problem # 2:  UTI (ICD-599.0)  +Udip - start abx (selection based on allg and potential coumadin interactions) Her updated  medication list for this problem includes:    Amoxicillin 500 Mg Tabs (Amoxicillin) .[name]... 1 by mouth three times a day  Encouraged to push clear liquids, get enough rest, and take acetaminophen as needed. To be seen in 10 days if no improvement, sooner if worse.  Orders: Prescription Created Electronically [phone])  Problem # 3:  HYPERLIPIDEMIA (ICD-272.4)  off statins (lipitor) due to elevated CK since 10/2009 hosp 11/2009 repeat CK still high; LFTs, and fasting lipids reviewed with pt/dtr today.   plan to remain off statin until CK is normal.  cards will have to weight risks vs benefit with most recent LDL.  Labs Reviewed: SGOT: 26 (12/28/2009)   SGPT: 30 (12/28/2009)   HDL:54.10 (12/28/2009), 54.50 (05/21/2009)  LDL:50 (05/21/2009)  Chol:202 (12/28/2009), 127 (05/21/2009)  Trig:120.0 (12/28/2009), 111.0 (05/21/2009)  Complete Medication List: 1)  Lisinopril 20 Mg Tabs (Lisinopril) .... Take one tablet by mouth daily 2)  Warfarin Sodium 3 Mg Tabs (Warfarin sodium) .... Use as directed by anticoagualtion clinic 3)  Lopressor 100 Mg Tabs (Metoprolol tartrate) .... Take one tablet once daily 4)  Synthroid 25 Mcg Tabs (Levothyroxine sodium) .... Take one tablet by mouth once daily. 5)  Furosemide 40 Mg Tabs (Furosemide) .... Take one tablet by mouth daily. 6)  K-tabs 10 Meq Cr-tabs  (Potassium chloride) .[name].. 1 by mouth once daily 7)  Protonix 40 Mg Tbec (Pantoprazole sodium) .... Take one tablet by mouth once daily. 8)  Lecithin 400 Mg Caps (Lecithin) .... Take one tablet daily 9)  Vitamin D3 1000 Unit Caps (Cholecalciferol) .[name].. 1 daily 10)  Folic Acid 800 Mcg Tabs (Folic acid) .... Once daily 11)  Cvs Vitamin B12 100 Mcg Tabs (Cyanocobalamin) .... Once daily 12)  Ferrous Sulfate 325 (65 Fe) Mg Tabs (Ferrous sulfate) .... 21 tab once daily 13)  Carafate 1 Gm/66ml Susp (Sucralfate) .... Use as directed 14)  Citalopram Hydrobromide 20 Mg Tabs (Citalopram hydrobromide) .... Take 1 by mouth qd 15)  Fentanyl 75 Mcg/hr Pt72 (Fentanyl) .... Change every 72 hours 16)  Co Q-10 100 Mg Caps (Coenzyme q10) .... Take 1 by mouth qd 17)  Fish Oil 300 Mg Caps (Omega-3 fatty acids) .... Once daily 18)  Tylenol Extra Strength 500 Mg Tabs (Acetaminophen) .[name].. 1-2 by mouth evry 6 hours as needed for breakthrough pain 19)  Arava 10 Mg Tabs (Leflunomide) .... Once daily 20)  Beta Carotene Crys (Bulk chemicals) .... Once daily 21)  Zinc Magnesium  .... Uad 22)  Amoxicillin 500 Mg Tabs (Amoxicillin) .[name].. 1 by mouth three times a day  Other Orders: Flu Vaccine 6yrs + MEDICARE PATIENTS (J8119) Administration Flu vaccine - MCR (G0008) UA Dipstick w/o Micro (manual) (14782) Flu Vaccine Consent Questions     Do you have a history of severe allergic reactions to this vaccine? no    Any prior history of allergic reactions to egg and/or gelatin? no    Do you have a sensitivity to the preservative Thimersol? no    Do you have a past history of Guillan-Barre Syndrome? no    Do you currently have an acute febrile illness? no    Have you ever had a severe reaction to latex? no    Vaccine information given and explained to patient? yes    Are you currently pregnant? no    Lot Number:[id number]   Exp Date:10/30/2010   Site Given  Right Deltoid IM  Patient Instructions: 1)  it was good to see you  today.  2)  medications reviewed today - no changes to usual meds 3)  antibioitcs for your bladder infection - also refills on citalopram - your prescriptions have been electronically submitted to your pharmacy. Please take as directed. Contact our office if you believe you're having problems with the medication(s).  4)  continue to follow with rheumatology and cardiology as ongoing 5)  no other medication changes 6)  Please schedule a follow-up appointment in 3 months to continue review, call sooner if problems. will plan thyroid check next visit Prescriptions: CITALOPRAM HYDROBROMIDE 20 MG TABS (CITALOPRAM HYDROBROMIDE) take 1 by mouth qd  #90 x 3   Entered and Authorized by:   Newt Lukes MD   Signed by:   Newt Lukes MD on 01/26/2010   Method used:   Print then Give to Patient   RxID:   [id number] CITALOPRAM HYDROBROMIDE 20 MG TABS (CITALOPRAM HYDROBROMIDE) take 1 by mouth qd  #30 x 3   Entered and Authorized by:   Newt Lukes MD   Signed by:   Newt Lukes MD on 01/26/2010   Method used:   Electronically to        [facility] # [phone]* (retail)       [address] [place], [place]  [address]       Ph: [phone]       Fax: [phone]   RxID:   [id number] AMOXICILLIN 500 MG TABS (AMOXICILLIN) 1 by mouth three times a day  #30 x 0   Entered and Authorized by:   Newt Lukes MD   Signed by:   Newt Lukes MD on 01/26/2010   Method used:   Electronically to        [facility] # [phone]* (retail)       [address] [place], [place]  [address]       Ph: [phone]       Fax: [phone]   RxID:   [id number]    .lbmedflu  Laboratory Results   Urine Tests    Routine Urinalysis   Color: yellow Appearance: Hazy Glucose: negative   (Normal Range: Negative) Bilirubin: negative   (Normal Range: Negative) Ketone: negative   (Normal Range: Negative) Spec. Gravity: 1.015   (Normal Range:  1.003-1.035) Blood: large   (Normal Range: Negative) pH: 5.0   (Normal Range: 5.0-8.0) Protein: >=300   (Normal Range: Negative) Urobilinogen: 0.2   (Normal Range: 0-1) Nitrite: positive   (Normal Range: Negative) Leukocyte Esterace: moderate   (Normal Range: Negative)

## 2010-06-01 NOTE — Medication Information (Signed)
Summary: rov  Anticoagulant Therapy  Managed by: Lyna Poser, PharmD Referring MD: Johney Frame MD, Fayrene Fearing PCP: Newt Lukes MD Supervising MD: Johney Frame MD, Fayrene Fearing Indication 1: Atrial Fibrillation (chronic) Lab Used: [facility] [place] Site: [address] INR POC 2.2 INR RANGE 2.0-3.0  Dietary changes: yes       Details: has less of an apetite. Not eating as much.  Health status changes: no    Bleeding/hemorrhagic complications: no    Recent/future hospitalizations: no    Any changes in medication regimen? no    Recent/future dental: yes     Details: 6 month checkup end of montn  Any missed doses?: no       Is patient compliant with meds? yes       Allergies: 1)  ! Ultram  Anticoagulation Management History:      Positive risk factors for bleeding include an age of 75 years or older.  The bleeding index is 'intermediate risk'.  Positive CHADS2 values include History of CHF, History of HTN, and Age > 65 years old.  Her last INR was 3.5.  Anticoagulation responsible Akansha Wyche: Allred MD, Fayrene Fearing.  INR POC: 2.2.  Exp: 03/2011.    Anticoagulation Management Assessment/Plan:      The patient's current anticoagulation dose is Warfarin sodium 3 mg tabs: Use as directed by Anticoagualtion Clinic.  The target INR is 2.0-3.0.  The next INR is due 04/28/2010.  Anticoagulation instructions were given to patient.  Results were reviewed/authorized by Lyna Poser, PharmD.         Prior Anticoagulation Instructions: INR 3.5  No dose tonight then continue normal dose schedule 1 tablet everyday except 0.5 tablets on Mondays and Wednesdays.     Current Anticoagulation Instructions: INR 2.2 Contine taking a half tablet on monday, wednesday, and friday. And 1 tablet all other days. Recheck in 4 weeks.

## 2010-06-01 NOTE — Miscellaneous (Signed)
Summary: PT orders/[facility]  PT orders/[facility]   Imported By: Sherian Rein 08/17/2009 09:46:42  _____________________________________________________________________  External Attachment:    Type:   Image     Comment:   External Document

## 2010-06-01 NOTE — Progress Notes (Signed)
Summary: UTI  Phone Note Call from Patient Call back at Home Phone [phone]   Caller: Patient Summary of Call: Pt called stating that sxs of UTI have returned. Pt is requesting refill of ABX? Please advise. Initial call taken by: Margaret Pyle, CMA,  February 25, 2010 9:47 AM  Follow-up for Phone Call        ok to use amox 500mg  three times a day x 10d - erx done - IF recurrent symptoms, will need OV to further eval same with Ucx - thanks Follow-up by: Newt Lukes MD,  February 25, 2010 12:34 PM  Additional Follow-up for Phone Call Additional follow up Details #1::        Pt informed of above Additional Follow-up by: Margaret Pyle, CMA,  February 25, 2010 1:36 PM    New/Updated Medications: AMOXICILLIN 500 MG CAPS (AMOXICILLIN) 1 by mouth three times a day x 10d Prescriptions: AMOXICILLIN 500 MG CAPS (AMOXICILLIN) 1 by mouth three times a day x 10d  #30 x 0   Entered and Authorized by:   Newt Lukes MD   Signed by:   Newt Lukes MD on 02/25/2010   Method used:   Electronically to        [facility] [phone]* (retail)       [address] [place], [place]  [address]       Ph: [phone]       Fax: [phone]   RxID:   [id number]

## 2010-06-01 NOTE — Medication Information (Signed)
Summary: rov mb  Anticoagulant Therapy  Managed by: Bethena Midget, RN, BSN Referring MD: Johney Frame MD, Fayrene Fearing PCP: Newt Lukes MD Supervising MD: Juanda Chance MD, Matias Thurman Indication 1: Atrial Fibrillation (chronic) Lab Used: [facility] [place] Site: [address] INR POC 3.1 INR RANGE 2.0-3.0  Dietary changes: no    Health status changes: no    Bleeding/hemorrhagic complications: no    Recent/future hospitalizations: no    Any changes in medication regimen? yes       Details: Starting tonight Fentyl patch increasing to .   Recent/future dental: no  Any missed doses?: no       Is patient compliant with meds? yes       Allergies: 1)  ! Ultram  Anticoagulation Management History:      The patient is taking warfarin and comes in today for a routine follow up visit.  Positive risk factors for bleeding include an age of 75 years or older.  The bleeding index is 'intermediate risk'.  Positive CHADS2 values include History of CHF, History of HTN, and Age > 75 years old.  Anticoagulation responsible provider: Juanda Chance MD, Smitty Cords.  INR POC: 3.1.  Cuvette Lot#: [id number].  Exp: 10/2010.    Anticoagulation Management Assessment/Plan:      The patient's current anticoagulation dose is Warfarin sodium 3 mg tabs: Use as directed by Anticoagualtion Clinic.  The next INR is due 09/17/2009.  Anticoagulation instructions were given to patient.  Results were reviewed/authorized by Bethena Midget, RN, BSN.  She was notified by Bethena Midget, RN, BSN.         Prior Anticoagulation Instructions: INR = 3.1 (goal = 2-3)  Hold tonights coumadin dose  The patient's new dosage of coumadin will be decreased.  The new dosage includes:  Take 1 tablet every day except for friday and saturday take half a tablet  Current Anticoagulation Instructions: INR 3.1 Change dose to 1 pill everyday except 1/2 pill on Monday, Wednesdays and Fridays. Recheck in 2- 3 weeks.

## 2010-06-01 NOTE — Miscellaneous (Signed)
Summary: Plan of treatment/[facility]  Plan of treatment/[facility]   Imported By: Sherian Rein 10/14/2009 11:57:32  _____________________________________________________________________  External Attachment:    Type:   Image     Comment:   External Document

## 2010-06-01 NOTE — Medication Information (Signed)
Summary: [email or web address].mp  Anticoagulant Therapy  Managed by: Bethena Midget, RN, BSN Referring MD: Johney Frame MD, Fayrene Fearing PCP: none Supervising MD: Excell Seltzer MD, Casimiro Needle Indication 1: Atrial Fibrillation (chronic) Lab Used: [facility] [place] Site: [address] INR POC 2.9 INR RANGE 2.0-3.0  Dietary changes: no    Health status changes: no    Bleeding/hemorrhagic complications: no    Recent/future hospitalizations: no    Any changes in medication regimen? no    Recent/future dental: no  Any missed doses?: no       Is patient compliant with meds? yes       Allergies: 1)  ! Ultram  Anticoagulation Management History:      The patient is taking warfarin and comes in today for a routine follow up visit.  Positive risk factors for bleeding include an age of 75 years or older.  The bleeding index is 'intermediate risk'.  Positive CHADS2 values include History of CHF, History of HTN, and Age > 26 years old.  Anticoagulation responsible provider: Excell Seltzer MD, Casimiro Needle.  INR POC: 2.9.  Cuvette Lot#: [id number].  Exp: 06/2010.    Anticoagulation Management Assessment/Plan:      The patient's current anticoagulation dose is Warfarin sodium 3 mg tabs: Use as directed by Anticoagualtion Clinic.  The next INR is due 06/04/2009.  Anticoagulation instructions were given to patient.  Results were reviewed/authorized by Bethena Midget, RN, BSN.  She was notified by Bethena Midget, RN, BSN.         Prior Anticoagulation Instructions: Resume 3 mg daily.    Recheck on 1/6 with other appt in office.  Call with problems in the meantime.    Current Anticoagulation Instructions: INR 2.9 Continue 3mg s daily. Recheck in 4 weeks.

## 2010-06-01 NOTE — Consult Note (Signed)
Summary: [facility]   Imported By: Roderic Ovens 12/07/2009 12:20:05  _____________________________________________________________________  External Attachment:    Type:   Image     Comment:   External Document

## 2010-06-01 NOTE — Assessment & Plan Note (Signed)
Summary: [facility]   Visit Type:  Follow-up Primary Provider:  Newt Lukes MD   History of Present Illness: The patient presents today for routine electrophysiology followup. She reports doing very well since last being seen in our clinic. She denies further chest pain.  She is unaware of any afib.  Her BLE edema is stable with support hose.  The patient denies symptoms of palpitations, chest pain, shortness of breath, orthopnea, PND,  dizziness, presyncope, syncope, or neurologic sequela. The patient is tolerating medications without difficulties and is otherwise without complaint today.   Current Medications (verified): 1)  Lisinopril 20 Mg Tabs (Lisinopril) .... Take One Tablet By Mouth Daily 2)  Warfarin Sodium 3 Mg Tabs (Warfarin Sodium) .... Use As Directed By Anticoagualtion Clinic 3)  Lopressor 100 Mg Tabs (Metoprolol Tartrate) .... Take One Tablet Once Daily 4)  Synthroid 25 Mcg Tabs (Levothyroxine Sodium) .... Take One Tablet By Mouth Once Daily. 5)  Furosemide 40 Mg Tabs (Furosemide) .... Take One Tablet By Mouth Daily. 6)  K-Tabs 10 Meq Cr-Tabs (Potassium Chloride) .[name].. 1 By Mouth Once Daily 7)  Protonix 40 Mg Tbec (Pantoprazole Sodium) .... Take One Tablet By Mouth Once Daily. 8)  Lecithin 400 Mg Caps (Lecithin) .... Take One Tablet Daily 9)  Vitamin D3 1000 Unit Caps (Cholecalciferol) .[name].. 1 Daily 10)  Folic Acid 800 Mcg Tabs (Folic Acid) .... Once Daily 11)  Cvs Vitamin B12 100 Mcg Tabs (Cyanocobalamin) .... Once Daily 12)  Ferrous Sulfate 325 (65 Fe) Mg Tabs (Ferrous Sulfate) .... 21 Tab Once Daily 13)  Carafate 1 Gm/96ml Susp (Sucralfate) .... Use As Directed 14)  Citalopram Hydrobromide 20 Mg Tabs (Citalopram Hydrobromide) .... Take 1 By Mouth Qd 15)  Fentanyl 75 Mcg/hr Pt72 (Fentanyl) .... Change Every 72 Hours 16)  Co Q-10 100 Mg Caps (Coenzyme Q10) .... Take 1 By Mouth Qd 17)  Fish Oil 300 Mg Caps (Omega-3 Fatty Acids) .... Once Daily 18)  Tylenol  Extra Strength 500 Mg Tabs (Acetaminophen) .[name].. 1-2 By Mouth Evry 6 Hours As Needed For Breakthrough Pain 19)  Arava 10 Mg Tabs (Leflunomide) .... Once Daily 20)  Beta Carotene  Crys [employer]) .... Once Daily 21)  Zinc Magnesium .... Uad  Allergies: 1)  ! Ultram  Past History:  Past Medical History: Reviewed history from 08/27/2009 and no changes required. 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 Spinal stenosis GERD Hyperlipidemia Hypertension Hypothyroidism  MD rooster:  cards - allred rheum -  Past Surgical History: Reviewed history from 03/19/2009 and no changes required. Breast biopsy x 3 Cholecystectomy 04/06/1999 Partial hysterectomy 1970 Left hip arthropasty 1995 Right hip arthroplasy, 01/2001 Bilateral carpul tunnel release 1998 Lt breast lumpectomy (ductal CA) 12/2006 Lt inferior parathyroidectomy 07/2008  Social History: Reviewed history from 05/21/2009 and no changes required. Lives at [facility] since 03/2009.   married, lives with spouse moved here from [place] to be near her son.   Retired Futures trader.  Denies tobacco.  Rare ETOH.  Denies drugs.  Review of Systems       All systems are reviewed and negative except as listed in the HPI.   Vital Signs:  Patient profile:   77 year old female Height:      62 inches Weight:  155 pounds Pulse rate:   60 / minute BP sitting:   140 / 70  (left arm)  Vitals Entered By: Laurance Flatten CMA (December 23, 2009 11:51 AM)  Physical Exam  General:  elderly female, NAD Head:  normocephalic and atraumatic Eyes:  PERRLA/EOM intact; conjunctiva and lids normal. Mouth:  Teeth, gums and palate normal. Oral  mucosa normal. Neck:  supple,  JVP flat Chest Wall:  pacemaker site is well healed Lungs:  Clear bilaterally to auscultation and percussion. Heart:  Non-displaced PMI, chest non-tender; regular rate and rhythm, S1, S2 without murmurs, rubs or gallops. Carotid upstroke normal, no bruit. Normal abdominal aortic size, no bruits. Femorals normal pulses, no bruits. Pedals normal pulses. No edema, no varicosities. Abdomen:  Bowel sounds positive; abdomen soft and non-tender without masses, organomegaly, or hernias noted. No hepatosplenomegaly. Msk:  Back normal, normal gait. Muscle strength and tone normal. Pulses:  pulses normal in all 4 extremities Extremities:  2+ BLE pedal edema with venous stasis changes Neurologic:  Alert and oriented x 3.   PPM Specifications Following MD:  Hillis Range, MD     PPM Vendor:  [facility] [employer]     PPM Model Number:  [id number]     PPM Serial Number:  [id number] PPM DOI:  11/25/2005     PPM Implanting MD:  NOT IMPLANTED HERE  Lead 1    Location: RA     DOI: 11/25/2005     Model #: [id number]     Serial #: [id number]     Status: active Lead 2    Location: RV     DOI: 11/25/2005     Model #: [id number]     Serial #: [id number]     Status: active  Magnet Response Rate:  BOL100 ERI  85  Indications:  AFIB   PPM Follow Up Battery Voltage:  GOOD V     Battery Est. Longevity:  4 YRS     Pacer Dependent:  Yes       PPM Device Measurements Atrium  Amplitude: 1.5 mV, Impedance: 560 ohms, Threshold: 0.6 V at 0.50 msec Right Ventricle  Amplitude: 9.8 mV, Impedance: 460 ohms, Threshold: 0.9 V at 0.50 msec  Episodes MS Episodes:  18     Percent Mode Switch:  2%     Coumadin:  Yes Ventricular High Rate:  0     Atrial Pacing:  47%     Ventricular Pacing:  2%  Parameters Mode:  DDDR     Lower Rate Limit:  60     Upper Rate Limit:  130 Paced AV Delay:  210     Sensed AV Delay:  300 Next Cardiology Appt Due:  06/02/2010 Tech Comments:  18 MODE SWITCHES.  NORMAL DEVICE FUNCTION.  NO CHANGES  MADE. ROV IN 6 MTHS W/DEVICE CLINIC. Vella Kohler  December 23, 2009 12:04 PM MD Comments:  agree, longest afib was 9 hours, episodes are decreased in frequency (only 2%)   Impression & Recommendations:  Problem # 1:  CHEST PAIN, ATYPICAL (ICD-786.59) resolved no further workup  Problem # 2:  ATRIAL FIBRILLATION (ICD-427.31) stable continue coumadin  Problem # 3:  HYPERLIPIDEMIA (ICD-272.4) previously off statins due to elevated CK  we will repeat CK, LFTs, and fasting lipids.  We may consider restarting statin if CK is normal.  Will have to weight risks with most recent LDL.  Problem # 4:  EDEMA (ICD-782.3)  The patient's edema is due to venous insufficiency.  She is actually  volume deplete on exam and by labs recently. Continue lasix 40mg  daily.  Salt restriction and daily weights are advised.  Problem # 5:  PACEMAKER, PERMANENT (ICD-V45.01) normal pacemaker function for tachy/brady syndrome no changes today

## 2010-06-01 NOTE — Progress Notes (Signed)
  Phone Note Call from Patient Call back at [facility] Phone [phone]   Caller: Patient Summary of Call: Pt called stating that she has been having vomiting and diarrhea since 9pm last night, green in color. Please advise, no fever or abd pain. Initial call taken by: Margaret Pyle, CMA,  March 02, 2010 10:42 AM  Follow-up for Phone Call        rec OV to eval same, may need different abx (called in last week for UTI) - or if unable to stay hydrated, rec ER eval and tx - push clear liquids (broth, juice, ginger ale or sprite) until that time Follow-up by: Newt Lukes MD,  March 02, 2010 12:18 PM  Additional Follow-up for Phone Call Additional follow up Details #1::        Pt advised of above and will call back to sch with VAL today once transportation is arraged with living facility. Sch aware. Additional Follow-up by: Margaret Pyle, CMA,  March 02, 2010 12:50 PM

## 2010-06-01 NOTE — Assessment & Plan Note (Signed)
Summary: NEW / MCR / # / CD   Vital Signs:  Patient profile:   77 year old female Height:      64 inches (162.56 cm) Weight:      144.0 pounds (65.45 kg) O2 Sat:      97 % on Room air Temp:     97.0 degrees F (36.11 degrees C) oral Pulse rate:   60 / minute BP sitting:   128 / 72  (left arm) Cuff size:   regular  Vitals Entered By: Orlan Leavens (May 21, 2009 9:42 AM)  O2 Flow:  Room air CC: New patient/ Req something to replace darvocet Is Patient Diabetic? No Pain Assessment Patient in pain? no        Primary Care Provider:  Newt Lukes MD  CC:  New patient/ Req something to replace darvocet.  History of Present Illness: new pt to me and our practice - here to est care with PCP following with LeB cards and coumadin clinic as well -  1) OA - was taking darvocet for control of breakthru pain symptoms until removed from market last year -  also on patch fentanyl for dail pain control due to pain from spinal stenosis - requests alternative med for as needed use  2) HTN reports compliance with ongoing medical treatment and no changes in medication dose or frequency. denies adverse side effects related to current therapy.   3) dyslipidemia - reports compliance with ongoing medical treatment and no changes in medication dose or frequency. denies adverse side effects related to current therapy.   4) edema - chronic problem in both legs but reports worse since moving to [place] (2mos ago) not a/w SOB or abd swelling - a/w weight loss, not weight gain - concerned her kidneys or liver is not working well - had compression hose but only below knee height and did not seem effective per pt  5) lastly c/o hoarseness x 2 mos - not a/w ST, not a/w head congestin or PND - no reflux symptoms apparent - no sneezing or trouble swallowing - no lose of voice - just gruff sounding quality - no prior hx of same  Preventive Screening-Counseling & Management  Alcohol-Tobacco  Alcohol drinks/day: <1     Alcohol Counseling: not indicated; use of alcohol is not excessive or problematic     Smoking Status: never     Tobacco Counseling: not indicated; no tobacco use  Caffeine-Diet-Exercise     Does Patient Exercise: no     Exercise Counseling: to improve exercise regimen     Depression Counseling: not indicated; screening negative for depression  Safety-Violence-Falls     Fall Risk Counseling: not indicated; no significant falls noted  Clinical Review Panels:  Prevention   Last Mammogram:  No specific mammographic evidence of malignancy.   (05/03/2007)   Last Pap Smear:  Interpretation/Result:Negative for intraepithelial Lesion or Malignancy.    (01/30/2009)   Last Colonoscopy:  Pt states was done in [facility], results normal (05/03/2007)  Immunizations   Last Tetanus Booster:  Historical (05/02/2004)   Last Flu Vaccine:  Historical (01/30/2009)   Last Pneumovax:  Historical (05/03/2007)   Current Medications (verified): 1)  Lisinopril 20 Mg Tabs (Lisinopril) .... Take One Tablet By Mouth Daily 2)  Warfarin Sodium 3 Mg Tabs (Warfarin Sodium) .... Use As Directed By Anticoagualtion Clinic 3)  Lopressor 100 Mg Tabs (Metoprolol Tartrate) .... Take One Tablet Once Daily 4)  Synthroid 25 Mcg Tabs (Levothyroxine Sodium) .Marland KitchenMarland KitchenMarland Kitchen  Take One Tablet By Mouth Once Daily. 5)  Lipitor 10 Mg Tabs (Atorvastatin Calcium) .... Take One Tablet By Mouth Once Daily. 6)  Furosemide 40 Mg Tabs (Furosemide) .... Take One Tablet By Mouth Daily. 7)  Potassium Chloride Cr 10 Meq Cr-Caps (Potassium Chloride) .... Take One Tablet By Mouth Twice A Day 8)  Protonix 40 Mg Tbec (Pantoprazole Sodium) .... Take One Tablet By Mouth Once Daily. 9)  Calcium Carbonate   Powd (Calcium Carbonate) .... Uad 10)  Vitamin C 500 Mg Chew (Ascorbic Acid) .... Once Daily 11)  Lecithin 400 Mg Caps (Lecithin) .... Take One Tablet Daily 12)  Vitamin D3 1000 Unit Caps (Cholecalciferol) .... 2 Caps  Daily 13)  Folic Acid 800 Mcg Tabs (Folic Acid) .... Once Daily 14)  Cvs Vitamin B12 100 Mcg Tabs (Cyanocobalamin) .... Once Daily 15)  Ferrous Sulfate 325 (65 Fe) Mg Tabs (Ferrous Sulfate) .... 2 Tablets Daily 16)  Carafate 1 Gm/27ml Susp (Sucralfate) .... Use As Directed 17)  Citalopram Hydrobromide 20 Mg Tabs (Citalopram Hydrobromide) .... Take 1 By Mouth Qd 18)  Fentanyl 50 Mcg/hr Pt72 (Fentanyl) .... Use As Directed 19)  Co Q-10 100 Mg Caps (Coenzyme Q10) .... Take 1 By Mouth Qd 20)  Fish Oil 300 Mg Caps (Omega-3 Fatty Acids) .... Once Daily  Allergies (verified): 1)  ! 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 Stress test "normal per pt" 5 years ago. Spinal stenosis GERD Hyperlipidemia Hypertension Urinary incontinence  Past Surgical History: Reviewed history from 03/19/2009 and no changes required. Breast biopsy x 3 Cholecystectomy 04/06/1999 Partial hysterectomy 1970 Left hip arthropasty 1995 Right hip arthroplasy, 01/2001 Bilateral carpul tunnel release 1998 Lt breast lumpectomy (ductal CA) 12/2006 Lt inferior parathyroidectomy 07/2008  Family History: Reviewed history from 03/19/2009 and no changes required. several siblings with CAD Family History Diabetes 1st degree relative (other relative) Family History High cholesterol (parent, grandparent) Family History Hypertension (parent, grandparent) Family History Lung cancer (parent) Family History Ovarian cancer (parent)  Social History: Lives at [facility] since 03/2009.   married, lives with spouse moved here from [place] to be near her son.   Retired Futures trader.   Denies tobacco.  Rare ETOH.  Denies drugs.Does Patient Exercise:  no  Review of Systems       also c/o numbness in soles of both feet; and constipation x 1 month - otherwise, see HPI above. I have reviewed all other systems and they were negative.   Physical Exam  General:  thin, eldelry female, NAD - dtr at side (visiting from [place]) Eyes:  vision grossly intact; pupils equal, round and reactive to light.  conjunctiva and lids normal.    Ears:  normal pinnae bilaterally, without erythema, swelling, or tenderness to palpation. TMs clear, without effusion, or cerumen impaction. Hearing grossly normal bilaterally  Mouth:  teeth and gums in good repair; mucous membranes moist, without lesions or ulcers. oropharynx clear without exudate, min erythema.  Lungs:  normal respiratory effort, no intercostal retractions or use of accessory muscles; normal breath sounds bilaterally - no crackles and no wheezes.    Heart:  normal rate, regular rhythm, no murmur, and no rub. BLE with 1++ soft edema to knees. normal DP pulses and normal  cap refill in all 4 extremities    Abdomen:  soft, non-tender, normal bowel sounds, no distention; no masses and no appreciable hepatomegaly or splenomegaly.   Msk:  uses a rolling walking, diffuse muscle atrophy; no gross skeletal deformaties Neurologic:  alert & oriented X3 and cranial nerves II-XII symetrically intact.  strength normal in all extremities, sensation intact to light touch, and gait slow with RW, bent forward at waist. speech fluent without dysarthria or aphasia; follows commands with good comprehension.  Skin:  Intact without lesions or rashes. Psych:  Oriented X3, memory intact for recent and remote, normally interactive, good eye contact, not anxious appearing, not depressed appearing, and not agitated.      Impression & Recommendations:  Problem # 1:  EDEMA (ICD-782.3) check l abs to r/o underlying med illness - temp increase in diuretics if  Cr ok, then resume daily maintence dosing - encouraged use of compression hose --rx for thigh high provided - likely d/t long standing dx of venous insuff - etiology of same reviewed in depth with pt/dtr - ?need repeat echo given dx of pulmHTN - will review cards and prior med notes from [place] in further  detail when available Her updated medication list for this problem includes:    Furosemide 80 Mg Tabs (Furosemide) .[name]... 1 by mouth two times a day x7days, then once daily (or as directed)  Orders: TLB-BMP (Basic Metabolic Panel-BMET) (80048-METABOL) TLB-CBC Platelet - w/Differential (85025-CBCD) TLB-TSH (Thyroid Stimulating Hormone) (84443-TSH) TLB-Hepatic/Liver Function Pnl (80076-HEPATIC)  Problem # 2:  HOARSENESS (ZOX-096.04) limited exam of ENT today appears benign -  as onset a/w move to  and no hoarsness prior to 2 mos ago, would suspect allgery symptoms have some role - rec cont PPI for GERD and add claritin daily -  if not improving with tx of same, will refer to ENT - d/w pt who understands and agrees to med trial  Problem # 3:  HYPOTHYROIDISM (ICD-244.9)  Her updated medication list for this problem includes:    Synthroid 25 Mcg Tabs (Levothyroxine sodium) .[name]... Take one tablet by mouth once daily.  Orders: TLB-TSH (Thyroid Stimulating Hormone) (84443-TSH)  Problem # 4:  HYPERTENSION (ICD-401.9)  Her updated medication list for this problem includes:    Lisinopril 20 Mg Tabs (Lisinopril) .[name]... Take one tablet by mouth daily    Lopressor 100 Mg Tabs (Metoprolol tartrate) .[name]... Take one tablet once daily    Furosemide 80 Mg Tabs (Furosemide) .[name]... 1 by mouth two times a day x7days, then once daily (or as directed)  BP today: 128/72 Prior BP: 128/60 (03/19/2009)  Problem # 5:  HYPERLIPIDEMIA (ICD-272.4)  The following medications were removed from the medication list:    Gemfibrozil 600 Mg Tabs (Gemfibrozil) .[name]... Take one tablet by mouth twice a day with meals Her  updated medication list for this problem includes:    Lipitor 10 Mg Tabs (Atorvastatin calcium) .[name]... Take one tablet by mouth once daily.  Orders: TLB-Lipid Panel (80061-LIPID)  Problem # 6:  GERD (ICD-530.81)  Her updated medication list for this problem includes:    Protonix 40 Mg Tbec (Pantoprazole sodium) .[name]... Take one tablet by mouth once daily.    Carafate 1 Gm/39ml Susp (Sucralfate) ..... Use as directed  Problem # 7:  ARTHRITIS (ICD-716.90) reviewed role of darvocet in breakthru pain - as primarily tylenol, rec tylenol alone 1st (into of ultram) pt has misunderstanding of its saftey with coumadin - reassured NSAIDs are to be avoided but tylenol ok  so long as not in excess of 4g/24h  Problem # 8:  ATRIAL FIBRILLATION (ICD-427.31)  Her updated medication list for this problem includes:    Warfarin Sodium 3 Mg Tabs (Warfarin sodium) ..... Use as directed by anticoagualtion clinic    Lopressor 100 Mg Tabs (Metoprolol tartrate) .[name]... Take one tablet once daily  Well controlled off antiarrhythmics.   Coumadin (goal INR 2-3)  Reviewed the following: Coumadin Dose (weekly): 21 mg (05/07/2009) Prior Coumadin Dose (weekly): 21 mg (05/07/2009) Next Protime: 06/04/2009 (dated on 05/07/2009)  Problem # 9:  PULMONARY HYPERTENSION (ICD-416.8)  Problem # 10:  CHF (ICD-428.0)  Her updated medication list for this problem includes:    Lisinopril 20 Mg Tabs (Lisinopril) .[name]... Take one tablet by mouth daily    Warfarin Sodium 3 Mg Tabs (Warfarin sodium) ..... Use as directed by anticoagualtion clinic    Lopressor 100 Mg Tabs (Metoprolol tartrate) .[name]... Take one tablet once daily    Furosemide 80 Mg Tabs (Furosemide) .[name]... 1 by mouth two times a day x7days, then once daily (or as directed) Time spent with patient and dtr 60 minutes, more than 50% of this time was spent reviewing medical problems and current meds, counseling patient on edema causes and venous insuff, and mgmt of  pain/explanation of tylenol saftery as compared to darvocet  Complete Medication List: 1)  Lisinopril 20 Mg Tabs (Lisinopril) .... Take one tablet by mouth daily 2)  Warfarin Sodium 3 Mg Tabs (Warfarin sodium) .... Use as directed by anticoagualtion clinic 3)  Lopressor 100 Mg Tabs (Metoprolol tartrate) .... Take one tablet once daily 4)  Synthroid 25 Mcg Tabs (Levothyroxine sodium) .... Take one tablet by mouth once daily. 5)  Lipitor 10 Mg Tabs (Atorvastatin calcium) .... Take one tablet by mouth once daily. 6)  Furosemide 80 Mg Tabs (Furosemide) .[name].. 1 by mouth two times a day x7days, then once daily (or as directed) 7)  K-tabs 10 Meq Cr-tabs (Potassium chloride) .[name].. 1 by mouth two times a day x 7days, then once daily (or as directed) 8)  Protonix 40 Mg Tbec (Pantoprazole sodium) .... Take one tablet by mouth once daily. 9)  Calcium Carbonate Powd (Calcium carbonate) .... Uad 10)  Vitamin C 500 Mg Chew (Ascorbic acid) .... Once daily 11)  Lecithin 400 Mg Caps (Lecithin) .... Take one tablet daily 12)  Vitamin D3 1000 Unit Caps (Cholecalciferol) .... 2 caps daily 13)  Folic Acid 800 Mcg Tabs (Folic acid) .... Once daily 14)  Cvs Vitamin B12 100 Mcg Tabs (Cyanocobalamin) .... Once daily 15)  Ferrous Sulfate 325 (65 Fe) Mg Tabs (Ferrous sulfate) .... 2 tablets daily 16)  Carafate 1 Gm/31ml Susp (Sucralfate) .... Use as directed 17)  Citalopram Hydrobromide 20 Mg Tabs (Citalopram hydrobromide) .... Take 1 by mouth qd 18)  Fentanyl 50 Mcg/hr Pt72 (Fentanyl) .... Use as directed 19)  Co Q-10 100 Mg Caps (Coenzyme q10) .... Take 1 by mouth qd 20)  Fish Oil 300 Mg Caps (Omega-3 fatty acids) .... Once daily 21)  Tylenol Extra Strength 500 Mg Tabs (Acetaminophen) .[name].. 1-2 by mouth evry 6 hours as needed for breakthrough pain 22)  Claritin 10 Mg Tabs (Loratadine) .[name].. 1 by mouth once daily x 7 days, then as needed for allergy/hoarseness  Patient Instructions: 1)  it was good to see you today.    2)  test(s) ordered today - your results will be posted on the phone tree for review in 48-72 hours from the time of test  completion; call [phone] and enter your 9 digit MRN (listed above on this page, just below your name); if any changes need to be made or there are abnormal results, you will be contacted directly.  3)  increase lasix and potassium x 1 week, then resume each once daily as before (to help with leg swelling) -  4)  also wear thigh high compression stockings during the day (ok to remove at night/bedtinme) - new prescription provided 5)  try tylenol 650-1000mg  four times a day as needed for arthritis pain in place of former darvocet use 6)  also try daily claritin x 1 week for hoarseness, then as needed (to treat possible allergy symptoms) 7)  add Colace or sennakot or miralax (all OTC) as stool softener and increase fiber intake 8)  will review your prior medical records from [place] once available here 9)  Please schedule a follow-up appointment in 2-3 months, sooner if problems.    Immunization History:  Tetanus/Td Immunization History:    Tetanus/Td:  historical (05/02/2004)  Influenza Immunization History:    Influenza:  historical (01/30/2009)  Pneumovax Immunization History:    Pneumovax:  historical (05/03/2007)    Pap Smear  Procedure date:  01/30/2009  Findings:      Interpretation/Result:Negative for intraepithelial Lesion or Malignancy.     Mammogram  Procedure date:  05/03/2007  Findings:      No specific mammographic evidence of malignancy.    Colonoscopy  Procedure date:  05/03/2007  Findings:      Pt states was done in [facility], results normal

## 2010-06-01 NOTE — Medication Information (Signed)
Summary: rov/cs  Anticoagulant Therapy  Managed by: Weston Brass, PharmD Referring MD: Johney Frame MD, Fayrene Fearing PCP: Newt Lukes MD Supervising MD: Johney Frame MD, Fayrene Fearing Indication 1: Atrial Fibrillation (chronic) Lab Used: [facility] [place] Site: [address] INR POC 2.1 INR RANGE 2.0-3.0  Dietary changes: no    Health status changes: yes       Details: UTI  Bleeding/hemorrhagic complications: no    Recent/future hospitalizations: no    Any changes in medication regimen? yes       Details: 5 day course of penicillin for UTI -last dose 01/30/2010  Recent/future dental: no  Any missed doses?: no       Is patient compliant with meds? yes       Allergies: 1)  ! Ultram  Anticoagulation Management History:      The patient is taking warfarin and comes in today for a routine follow up visit.  Positive risk factors for bleeding include an age of 46 years or older.  The bleeding index is 'intermediate risk'.  Positive CHADS2 values include History of CHF, History of HTN, and Age > 25 years old.  Anticoagulation responsible Artemis Koller: Allred MD, Fayrene Fearing.  INR POC: 2.1.  Cuvette Lot#: [id number].  Exp: 03/2011.    Anticoagulation Management Assessment/Plan:      The patient's current anticoagulation dose is Warfarin sodium 3 mg tabs: Use as directed by Anticoagualtion Clinic.  The target INR is 2.0-3.0.  The next INR is due 03/04/2010.  Anticoagulation instructions were given to patient.  Results were reviewed/authorized by Weston Brass, PharmD.  She was notified by Haynes Hoehn, PharmD Candidate.         Prior Anticoagulation Instructions: INR 2.0  Continue taking one tablet every day except for one-half tablet on Monday, Wednesday, and Friday.  We will see you in four weeks.   Current Anticoagulation Instructions: INR 2.1  Continue Coumadin 3 mg (1 tablet) every day except 1.5mg  (0.5 tablet) on Monday, Wednesday, and Friday.  Return to clinic 4 weeks.

## 2010-06-01 NOTE — Letter (Signed)
Summary: CMN for [facility]  CMN for [facility]   Imported By: Sherian Rein 07/20/2009 13:15:35  _____________________________________________________________________  External Attachment:    Type:   Image     Comment:   External Document

## 2010-06-01 NOTE — Assessment & Plan Note (Signed)
Summary: f/u appt/#/cd   Vital Signs:  Patient profile:   77 year old female Height:      61.5 inches (156.21 cm) Weight:      151.12 pounds (68.69 kg) O2 Sat:      92 % on Room air Temp:     98.3 degrees F (36.83 degrees C) oral Pulse rate:   74 / minute BP sitting:   110 / 62  (left arm) Cuff size:   regular  O2 Flow:  Room air CC: follow-up visit Is Patient Diabetic? No Pain Assessment Patient in pain? no        Primary Care Provider:  Newt Lukes MD  CC:  follow-up visit.  History of Present Illness: here f/u n/v/d - seen by me and my partner 03/10/10 for same was on amox for UTI - stopped same and on emperic flagyl since 11/9 stool sample not returned until 11/14 (s/p 4 d flagyl tx) now v/d resolved, still nasuea but tol food w/o problems   Current Medications (verified): 1)  Lisinopril 20 Mg Tabs (Lisinopril) .... Take One Tablet By Mouth Daily 2)  Warfarin Sodium 3 Mg Tabs (Warfarin Sodium) .... Use As Directed By Anticoagualtion Clinic 3)  Lopressor 100 Mg Tabs (Metoprolol Tartrate) .... Take One Tablet Once Daily 4)  Synthroid 25 Mcg Tabs (Levothyroxine Sodium) .... Take One Tablet By Mouth Once Daily. 5)  Furosemide 40 Mg Tabs (Furosemide) .... Take One Tablet By Mouth Daily. 6)  K-Tabs 10 Meq Cr-Tabs (Potassium Chloride) .[name].. 1 By Mouth Once Daily 7)  Protonix 40 Mg Tbec (Pantoprazole Sodium) .... Take One Tablet By Mouth Once Daily. 8)  Lecithin 400 Mg Caps (Lecithin) .... Take One Tablet Daily 9)  Vitamin D3 1000 Unit Caps (Cholecalciferol) .[name].. 1 Daily 10)  Folic Acid 800 Mcg Tabs (Folic Acid) .... Once Daily 11)  Cvs Vitamin B12 100 Mcg Tabs (Cyanocobalamin) .... Once Daily 12)  Ferrous Sulfate 325 (65 Fe) Mg Tabs (Ferrous Sulfate) .... 21 Tab Once Daily 13)  Carafate 1 Gm/75ml Susp (Sucralfate) .... Use As Directed 14)  Citalopram Hydrobromide 20 Mg Tabs (Citalopram Hydrobromide) .... Take 1 By Mouth Qd 15)  Fentanyl 75 Mcg/hr Pt72 (Fentanyl)  .... Change Every 72 Hours 16)  Co Q-10 100 Mg Caps (Coenzyme Q10) .... Take 1 By Mouth Qd 17)  Fish Oil 300 Mg Caps (Omega-3 Fatty Acids) .... Once Daily 18)  Tylenol Extra Strength 500 Mg Tabs (Acetaminophen) .[name].. 1-2 By Mouth Evry 6 Hours As Needed For Breakthrough Pain 19)  Arava 10 Mg Tabs (Leflunomide) .... Once Daily 20)  Beta Carotene  Crys ([employer]s) .... Once Daily 21)  Zinc Magnesium .... Uad 22)  Zofran 4 Mg Tabs (Ondansetron Hcl) .[name].. 1 By Mouth Every 6 Hours As Needed For Nausea or Vomitting 23)  Metronidazole 250 Mg Tabs (Metronidazole) .[name].. 1 By Mouth Q 6 Hrs For 10 Days  Allergies (verified): 1)  ! 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 Spinal stenosis  GERD Hyperlipidemia - abn CK on lipitor - stopped 10/2009 Hypertension Hypothyroidism  MD roster:  cards - allred rheum - anderson  Review of Systems  The patient denies fever, chest  pain, headaches, abdominal pain, melena, hematochezia, and severe indigestion/heartburn.    Physical Exam  General:  alert, well-developed, well-nourished, and cooperative to examination.   dtr in law at side Lungs:  normal respiratory effort, no intercostal retractions or use of accessory muscles; normal breath sounds bilaterally - no crackles and no wheezes.    Heart:  normal rate, regular rhythm, no murmur, and no rub. BLE with chronic 1+ ankle edema.  Abdomen:  soft, non-tender, normal bowel sounds, no distention; no masses and no appreciable hepatomegaly or splenomegaly.     Impression & Recommendations:  Problem # 1:  DIARRHEA (ICD-787.91)  Her updated  medication list for this problem includes:    Align Caps (Probiotic product) .[name]... 1-2 by mouth once daily  probable c diff vs viral illness vs antibx assoc -   improved on emperic flagyl - complete same reviewed 11/9 +11/14 labs including stool c diff - negative add probiotic call if symptoms recurr  Complete Medication List: 1)  Lisinopril 20 Mg Tabs (Lisinopril) .... Take one tablet by mouth daily 2)  Warfarin Sodium 3 Mg Tabs (Warfarin sodium) .... Use as directed by anticoagualtion clinic 3)  Lopressor 100 Mg Tabs (Metoprolol tartrate) .... Take one tablet once daily 4)  Synthroid 25 Mcg Tabs (Levothyroxine sodium) .... Take one tablet by mouth once daily. 5)  Furosemide 40 Mg Tabs (Furosemide) .... Take one tablet by mouth daily. 6)  K-tabs 10 Meq Cr-tabs (Potassium chloride) .[name].. 1 by mouth once daily 7)  Protonix 40 Mg Tbec (Pantoprazole sodium) .... Take one tablet by mouth once daily. 8)  Lecithin 400 Mg Caps (Lecithin) .... Take one tablet daily 9)  Vitamin D3 1000 Unit Caps (Cholecalciferol) .[name].. 1 daily 10)  Folic Acid 800 Mcg Tabs (Folic acid) .... Once daily 11)  Cvs Vitamin B12 100 Mcg Tabs (Cyanocobalamin) .... Once daily 12)  Ferrous Sulfate 325 (65 Fe) Mg Tabs (Ferrous sulfate) .... 21 tab once daily 13)  Carafate 1 Gm/68ml Susp (Sucralfate) .... Use as directed 14)  Citalopram Hydrobromide 20 Mg Tabs (Citalopram hydrobromide) .... Take 1 by mouth qd 15)  Fentanyl 75 Mcg/hr Pt72 (Fentanyl) .... Change every 72 hours 16)  Co Q-10 100 Mg Caps (Coenzyme q10) .... Take 1 by mouth qd 17)  Fish Oil 300 Mg Caps (Omega-3 fatty acids) .... Once daily 18)  Tylenol Extra Strength 500 Mg Tabs (Acetaminophen) .[name].. 1-2 by mouth evry 6 hours as needed for breakthrough pain 19)  Arava 10 Mg Tabs (Leflunomide) .... Once daily 20)  Beta Carotene Crys (Bulk chemicals) .... Once daily 21)  Zinc Magnesium  .... Uad 22)  Zofran 4 Mg Tabs (Ondansetron hcl) .[name].. 1 by mouth every 6 hours as  needed for nausea or vomitting 23)  Metronidazole 250 Mg Tabs (Metronidazole) .[name].. 1 by mouth q 6 hrs for 10 days 24)  Align Caps (Probiotic product) .[name].. 1-2 by mouth once daily  Patient Instructions: 1)  it was good to see you today. 2)  labs reviewed - glad you are feeling better 3)  complete antibiotics until gone, ok to resume lasix now 4)  start probiotic (sample Align + coupon provided) 5)  Please schedule a follow-up appointment in 3 months for review of usual medical issues, call sooner if recurrent or new problems.    Orders Added: 1)  Est. Patient Level III [16109]

## 2010-06-01 NOTE — Letter (Signed)
Summary: CMN for [facility]  CMN for [facility]   Imported By: Sherian Rein 07/15/2009 10:56:18  _____________________________________________________________________  External Attachment:    Type:   Image     Comment:   External Document

## 2010-06-01 NOTE — Medication Information (Signed)
Summary: rov/sp  Anticoagulant Therapy  Managed by: Eda Keys, PharmD Referring MD: Johney Frame MD, Fayrene Fearing PCP: Newt Lukes MD Supervising MD: Clifton James MD, Cristal Deer Indication 1: Atrial Fibrillation (chronic) Lab Used: [facility] [place] Site: [address] INR POC 2.0 INR RANGE 2.0-3.0  Dietary changes: no    Health status changes: no    Bleeding/hemorrhagic complications: no    Recent/future hospitalizations: no    Any changes in medication regimen? no    Recent/future dental: no  Any missed doses?: no       Is patient compliant with meds? yes       Allergies: 1)  ! Ultram  Anticoagulation Management History:      The patient is taking warfarin and comes in today for a routine follow up visit.  Positive risk factors for bleeding include an age of 75 years or older.  The bleeding index is 'intermediate risk'.  Positive CHADS2 values include History of CHF, History of HTN, and Age > 19 years old.  Anticoagulation responsible provider: Clifton James MD, Cristal Deer.  INR POC: 2.0.  Cuvette Lot#: [id number].  Exp: 03/2011.    Anticoagulation Management Assessment/Plan:      The patient's current anticoagulation dose is Warfarin sodium 3 mg tabs: Use as directed by Anticoagualtion Clinic.  The target INR is 2.0-3.0.  The next INR is due 02/04/2010.  Anticoagulation instructions were given to patient.  Results were reviewed/authorized by Eda Keys, PharmD.  She was notified by Kennieth Francois.         Prior Anticoagulation Instructions: INR 2.2  Continue with 1 tablet daily except 1/2 tablet Mon, Wed and Fri. Return to clinic in 4 weeks  Current Anticoagulation Instructions: INR 2.0  Continue taking one tablet every day except for one-half tablet on Monday, Wednesday, and Friday.  We will see you in four weeks.

## 2010-06-01 NOTE — Therapy (Signed)
[facility]   Imported By: Lester  07/28/2009 08:30:44  _____________________________________________________________________  External Attachment:    Type:   Image     Comment:   External Document

## 2010-06-02 ENCOUNTER — Ambulatory Visit: Admit: 2010-06-02 | Payer: Self-pay

## 2010-06-03 ENCOUNTER — Telehealth: Payer: Self-pay | Admitting: Internal Medicine

## 2010-06-03 ENCOUNTER — Encounter: Payer: Self-pay | Admitting: Internal Medicine

## 2010-06-03 NOTE — Assessment & Plan Note (Signed)
Summary: ELEVATED BP, BODY PAIN AND CHILLS   Vital Signs:  Patient profile:   77 year old female Height:      61.5 inches (156.21 cm) O2 Sat:      96 % on Room air Temp:     99.0 degrees F (37.22 degrees C) oral Pulse rate:   79 / minute BP sitting:   164 / 90  (left arm) Cuff size:   regular  Vitals Entered By: Orlan Leavens RMA (May 12, 2010 3:47 PM)  O2 Flow:  Room air CC: Elevated BP/ Bodyaches 7 chills Is Patient Diabetic? No Pain Assessment Patient in pain? yes     Location: body Type: aching   Primary Care Provider:  Newt Lukes MD  CC:  Elevated BP/ Bodyaches 7 chills.  History of Present Illness: multiple concerns elev BP despite med compliance, no changes in rx'd med/dose also chills and diffuse myalgias  onset 24h ago - "perfectly well yesterday AM" denies HA, ST, cough, sinus pressure, sneeze, swelling of joints and rash improved with extra tylenol but mild nausea, no vomiiting or diarrhea, no abd pain   also reviewed chronic med issues: 1) arthritis - clarified as RA after meeting with rheum 10/2009 - prev taking darvocet  until removed from market -  also on fentanyl patch for pain control - primary pain symptoms are in shoulders and prox thighs but +pain "all over everywhere" -- remotely on steroids - no weakness, no leg or back pain (also hx spinal stenosis noted) - no joint swelling   2) HTN -reports compliance with ongoing medical treatment and no changes in medication dose or frequency. denies adverse side effects related to current therapy.   3) dyslipidemia - on fish oil but no statin due to persistingly inc CK 11/2009(lipitor stopped 10/2009 due to inc CK in hosp) - follows with cards for same  4) edema - chronic problem in both legs but reports worse since moving to [place] end of 2010- not a/w SOB or abd swelling - a/w occ weight gain into 150s# - concern kidneys or liver not working well - has new set of compression hose which seem effective per  pt  5) hypothyroid - reports compliance with ongoing medical treatment and no changes in medication dose or frequency. denies adverse side effects related to current therapy. no weight changes or bowel or skin changes  also c/o dysura- inc ur freq, esp nocturia - mild burn, small vol voids - denies abd pain or fever -hx same but no UTI > 5 years   Clinical Review Panels:  Immunizations   Last Tetanus Booster:  Historical (05/02/2004)   Last Flu Vaccine:  Fluvax 3+ (01/26/2010)   Last Pneumovax:  Historical (05/03/2007)  Lipid Management   Cholesterol:  202 (12/28/2009)   LDL (bad choesterol):  50 (05/21/2009)   HDL (good cholesterol):  54.10 (12/28/2009)  CBC   WBC:  10.0 (03/10/2010)   RBC:  3.07 (03/10/2010)   Hgb:  10.8 (03/10/2010)   Hct:  31.6 (03/10/2010)   Platelets:  146.0 (03/10/2010)   MCV  102.9 (03/10/2010)   MCHC  34.2 (03/10/2010)   RDW  15.6 (03/10/2010)   PMN:  66.5 (03/10/2010)   Lymphs:  18.6 (03/10/2010)   Monos:  10.8 (03/10/2010)   Eosinophils:  3.3 (03/10/2010)   Basophil:  0.8 (03/10/2010)  Complete Metabolic Panel   Glucose:  92 (03/10/2010)   Sodium:  140 (03/10/2010)   Potassium:  5.1 (03/10/2010)   Chloride:  104 (03/10/2010)   CO2:  26 (03/10/2010)   BUN:  30 (03/10/2010)   Creatinine:  1.3 (03/10/2010)   Albumin:  3.6 (03/10/2010)   Total Protein:  6.4 (03/10/2010)   Calcium:  8.9 (03/10/2010)   Total Bili:  0.7 (03/10/2010)   Alk Phos:  62 (03/10/2010)   SGPT (ALT):  33 (03/10/2010)   SGOT (AST):  31 (03/10/2010)   Current Medications (verified): 1)  Lisinopril 20 Mg Tabs (Lisinopril) .... Take One Tablet By Mouth Daily 2)  Warfarin Sodium 3 Mg Tabs (Warfarin Sodium) .... Use As Directed By Anticoagualtion Clinic 3)  Lopressor 100 Mg Tabs (Metoprolol Tartrate) .... Take One Tablet Once Daily 4)  Synthroid 25 Mcg Tabs (Levothyroxine Sodium) .... Take One Tablet By Mouth Once Daily. 5)  Furosemide 40 Mg Tabs (Furosemide) .... Take  One Tablet By Mouth Daily. 6)  K-Tabs 10 Meq Cr-Tabs (Potassium Chloride) .[name].. 1 By Mouth Once Daily 7)  Protonix 40 Mg Tbec (Pantoprazole Sodium) .... Take One Tablet By Mouth Once Daily. 8)  Lecithin 400 Mg Caps (Lecithin) .... Take One Tablet Daily 9)  Vitamin D3 1000 Unit Caps (Cholecalciferol) .[name].. 1 Daily 10)  Folic Acid 800 Mcg Tabs (Folic Acid) .... Once Daily 11)  Cvs Vitamin B12 100 Mcg Tabs (Cyanocobalamin) .... Once Daily 12)  Ferrous Sulfate 325 (65 Fe) Mg Tabs (Ferrous Sulfate) .... 21 Tab Once Daily 13)  Carafate 1 Gm/6ml Susp (Sucralfate) .... Use As Directed 14)  Citalopram Hydrobromide 20 Mg Tabs (Citalopram Hydrobromide) .... Take 1 By Mouth Qd 15)  Fentanyl 75 Mcg/hr Pt72 (Fentanyl) .... Change Every 72 Hours 16)  Co Q-10 100 Mg Caps (Coenzyme Q10) .... Take 1 By Mouth Qd 17)  Fish Oil 300 Mg Caps (Omega-3 Fatty Acids) .... Once Daily 18)  Tylenol Extra Strength 500 Mg Tabs (Acetaminophen) .[name].. 1-2 By Mouth Evry 6 Hours As Needed For Breakthrough Pain 19)  Arava 10 Mg Tabs (Leflunomide) .... Once Daily 20)  Beta Carotene  Crys ([employer]) .... Once Daily 21)  Zinc Magnesium .... Uad 22)  Zofran 4 Mg Tabs (Ondansetron Hcl) .[name].. 1 By Mouth Every 6 Hours As Needed For Nausea or Vomitting 23)  Align  Caps (Probiotic Product) .[name].. 1-2 By Mouth Once Daily  Allergies (verified): 1)  ! 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 Spinal stenosis  GERD Hyperlipidemia - abn CK on lipitor - stopped 10/2009 Hypertension Hypothyroidism anemia  MD roster:  cards - allred rheum - anderson  Review of Systems       The  patient complains of anorexia and fever.  The patient denies hoarseness, chest pain, syncope, headaches, abdominal pain, and severe indigestion/heartburn.    Physical Exam  General:  alert, well-developed, well-nourished, and cooperative to examination.   dtr in law at side Eyes:  vision grossly intact, pupils equal, and pupils round.   Ears:  R ear normal and L ear normal.   Mouth:  no gingival abnormalities and pharynx pink and moist.   Lungs:  normal respiratory effort, no intercostal retractions or use of accessory muscles; normal breath sounds bilaterally - no crackles and no wheezes.    Heart:  normal rate, regular rhythm,  no murmur, and no rub. BLE with chronic 1+ ankle edema.  Abdomen:  soft, non-tender, normal bowel sounds, no distention; no masses and no appreciable hepatomegaly or splenomegaly.     Impression & Recommendations:  Problem # 1:  FEVER UNSPECIFIED (ICD-780.60)  suspect viral syndrome causing myalgia and fever symptoms (onset 24h ago) few localizing symptoms and largely normal exam (LGF and elev bp) reassurance offered re: suspected viral syndrome and screening labs for other bacterial infx today tylenol, fluids and refill zofran- to call if worse Orders: TLB-BMP (Basic Metabolic Panel-BMET) (80048-METABOL) TLB-CBC Platelet - w/Differential (85025-CBCD) TLB-Hepatic/Liver Function Pnl (80076-HEPATIC) TLB-Udip w/ Micro (81001-URINE)  Discussed fever control and symptomatic treatment.   Problem # 2:  HYPERTENSION (ICD-401.9) delay med changes at ths time given prior good control -  reassurance re: viral syndrome suspected and to call if cont elev BP for med change by phone next 24h - pt agrees to same Her updated medication list for this problem includes:    Lisinopril 20 Mg Tabs (Lisinopril) .[name]... Take one tablet by mouth daily    Lopressor 100 Mg Tabs (Metoprolol tartrate) .[name]... Take one tablet once daily    Furosemide 40 Mg Tabs (Furosemide) .[name]... Take one  tablet by mouth daily.  BP today: 164/90 Prior BP: 110/62 (03/18/2010) Prior BP: 132/72 (03/02/2010)  Labs Reviewed: K+: 5.1 (03/10/2010) Creat: : 1.3 (03/10/2010)   Chol: 202 (12/28/2009)   HDL: 54.10 (12/28/2009)   LDL: 50 (05/21/2009)   TG: 120.0 (12/28/2009)  Complete Medication List: 1)  Lisinopril 20 Mg Tabs (Lisinopril) .... Take one tablet by mouth daily 2)  Warfarin Sodium 3 Mg Tabs (Warfarin sodium) .... Use as directed by anticoagualtion clinic 3)  Lopressor 100 Mg Tabs (Metoprolol tartrate) .... Take one tablet once daily 4)  Synthroid 25 Mcg Tabs (Levothyroxine sodium) .... Take one tablet by mouth once daily. 5)  Furosemide 40 Mg Tabs (Furosemide) .... Take one tablet by mouth daily. 6)  K-tabs 10 Meq Cr-tabs (Potassium chloride) .[name].. 1 by mouth once daily 7)  Protonix 40 Mg Tbec (Pantoprazole sodium) .... Take one tablet by mouth once daily. 8)  Lecithin 400 Mg Caps (Lecithin) .... Take one tablet daily 9)  Vitamin D3 1000 Unit Caps (Cholecalciferol) .[name].. 1 daily 10)  Folic Acid 800 Mcg Tabs (Folic acid) .... Once daily 11)  Cvs Vitamin B12 100 Mcg Tabs (Cyanocobalamin) .... Once daily 12)  Ferrous Sulfate 325 (65 Fe) Mg Tabs (Ferrous sulfate) .... 21 tab once daily 13)  Carafate 1 Gm/28ml Susp (Sucralfate) .... Use as directed 14)  Citalopram Hydrobromide 20 Mg Tabs (Citalopram hydrobromide) .... Take 1 by mouth qd 15)  Fentanyl 75 Mcg/hr Pt72 (Fentanyl) .... Change every 72 hours 16)  Co Q-10 100 Mg Caps (Coenzyme q10) .... Take 1 by mouth qd 17)  Fish Oil 300 Mg Caps (Omega-3 fatty acids) .... Once daily 18)  Tylenol Extra Strength 500 Mg Tabs (Acetaminophen) .[name].. 1-2 by mouth evry 6 hours as needed for breakthrough pain 19)  Arava 10 Mg Tabs (Leflunomide) .... Once daily 20)  Beta Carotene Crys (Bulk chemicals) .... Once daily 21)  Zinc Magnesium  .... Uad 22)  Zofran 4 Mg Tabs (Ondansetron hcl) .[name].. 1 by mouth every 6 hours as needed for nausea or vomitting 23)   Align Caps (Probiotic product) .[name].. 1-2 by mouth once daily  Patient Instructions: 1)  it was good to see you today. 2)  test(s) ordered today - your results will be called to you  after review in 48-72 hours from the time of test completion 3)  refill on zofran to local pharmacy for nausea and [facility] 4)  if you develop worsening symptoms or fever, call us and we can reconsider antibiotics but it does not appear necessary to use any anitbiotic at this time  5)  Get plenty of rest, drink lots of clear liquids, and use Tylenol for fever and comfort. Return in 7-10 days if you're not better:sooner if you're feeling worse. Prescriptions: ZOFRAN 4 MG TABS (ONDANSETRON HCL) 1 by mouth every 6 hours as needed for nausea or vomitting  #30 x 1   Entered and Authorized by:   Newt Lukes MD   Signed by:   Newt Lukes MD on 05/12/2010   Method used:   Electronically to        [facility] # [phone]* (retail)       [address] [place], [place]  [address]       Ph: [phone]       Fax: [phone]   RxID:   [id number] CARAFATE 1 GM/10ML SUSP (SUCRALFATE) use as directed  #3 bottle x 3   Entered and Authorized by:   Newt Lukes MD   Signed by:   Newt Lukes MD on 05/12/2010   Method used:   Faxed to ...       [facility])       [address] [address]       [place], [place]  [address]       Ph: [phone]       Fax: [phone]   RxID:   [id number]    Orders Added: 1)  TLB-BMP (Basic Metabolic Panel-BMET) [80048-METABOL] 2)  TLB-CBC Platelet - w/Differential [85025-CBCD] 3)  TLB-Hepatic/Liver Function Pnl [80076-HEPATIC] 4)  TLB-Udip w/ Micro [81001-URINE] 5)  Est. Patient Level IV [66063]

## 2010-06-03 NOTE — Medication Information (Signed)
Summary: [facility]  [facility]   Imported By: Lester [place] 05/27/2010 10:16:30  _____________________________________________________________________  External Attachment:    Type:   Image     Comment:   External Document

## 2010-06-03 NOTE — Miscellaneous (Signed)
Summary: Plan/[facility]  Plan/[facility]   Imported By: Lester [place] 05/24/2010 07:45:53  _____________________________________________________________________  External Attachment:    Type:   Image     Comment:   External Document

## 2010-06-03 NOTE — Miscellaneous (Signed)
Summary: Order/[facility]  Order/[facility]   Imported By: Lester [place] 05/14/2010 07:54:49  _____________________________________________________________________  External Attachment:    Type:   Image     Comment:   External Document

## 2010-06-03 NOTE — Medication Information (Signed)
Summary: rov/mw  Anticoagulant Therapy  Managed by: Weston Brass, PharmD Referring MD: Johney Frame MD, Fayrene Fearing PCP: Newt Lukes MD Supervising MD: Riley Kill MD, Maisie Fus Indication 1: Atrial Fibrillation (chronic) Lab Used: [facility] [place] Site: [address] INR POC 3.0 INR RANGE 2.0-3.0  Dietary changes: no    Health status changes: no    Bleeding/hemorrhagic complications: no    Recent/future hospitalizations: no    Any changes in medication regimen? yes       Details: off abx for UTI  Recent/future dental: no  Any missed doses?: no       Is patient compliant with meds? yes       Allergies: 1)  ! Ultram  Anticoagulation Management History:      The patient is taking warfarin and comes in today for a routine follow up visit.  Positive risk factors for bleeding include an age of 75 years or older.  The bleeding index is 'intermediate risk'.  Positive CHADS2 values include History of CHF, History of HTN, and Age > 75 years old.  Her last INR was 3.5.  Anticoagulation responsible Walter Grima: Riley Kill MD, Maisie Fus.  INR POC: 3.0.  Cuvette Lot#: [id number].  Exp: 06/2011.    Anticoagulation Management Assessment/Plan:      The patient's current anticoagulation dose is Warfarin sodium 3 mg tabs: Use as directed by Anticoagualtion Clinic.  The target INR is 2.0-3.0.  The next INR is due 05/26/2010.  Anticoagulation instructions were given to patient.  Results were reviewed/authorized by Weston Brass, PharmD.  She was notified by Weston Brass PharmD.         Prior Anticoagulation Instructions: INR 2.2 Contine taking a half tablet on monday, wednesday, and friday. And 1 tablet all other days. Recheck in 4 weeks.  Current Anticoagulation Instructions: INR 3.0  Continue same dose of 1 tablet every day except 1/2 tablet on Monday, Wednesday and Friday.  Recheck INR in 4 weeks.

## 2010-06-03 NOTE — Progress Notes (Signed)
Summary: BP reading -still high - med change?  Phone Note Call from Patient Call back at [facility] Phone [phone]   Caller: Patient Summary of Call: Pt called to report BP readings per MD request at yesterdays OV: 143/89 158/83 and 146/90. Initial call taken by: Margaret Pyle, CMA,  May 13, 2010 9:57 AM  Follow-up for Phone Call        increase lisinopril to 20mg  two times a day - call if SBP<110 - thx Follow-up by: Newt Lukes MD,  May 13, 2010 12:31 PM  Additional Follow-up for Phone Call Additional follow up Details #1::        Pt informed and expressed understanding of recommendations Additional Follow-up by: Margaret Pyle, CMA,  May 13, 2010 1:56 PM    New/Updated Medications: LISINOPRIL 20 MG TABS (LISINOPRIL) Take one tablet by mouth two times a day

## 2010-06-03 NOTE — Medication Information (Signed)
Summary: rov/sp  Anticoagulant Therapy  Managed by: Weston Brass, PharmD Referring MD: Johney Frame MD, Fayrene Fearing PCP: Newt Lukes MD Supervising MD: Graciela Husbands MD, Viviann Spare Indication 1: Atrial Fibrillation (chronic) Lab Used: [facility] [place] Site: [address] INR POC 1.9 INR RANGE 2.0-3.0  Dietary changes: no    Health status changes: no    Bleeding/hemorrhagic complications: no    Recent/future hospitalizations: no    Any changes in medication regimen? no    Recent/future dental: no  Any missed doses?: no       Is patient compliant with meds? yes       Allergies: 1)  ! Ultram  Anticoagulation Management History:      The patient is taking warfarin and comes in today for a routine follow up visit.  Positive risk factors for bleeding include an age of 75 years or older.  The bleeding index is 'intermediate risk'.  Positive CHADS2 values include History of CHF, History of HTN, and Age > 10 years old.  Her last INR was 3.5.  Anticoagulation responsible provider: Graciela Husbands MD, Viviann Spare.  INR POC: 1.9.  Cuvette Lot#: [id number].  Exp: 05/2011.    Anticoagulation Management Assessment/Plan:      The patient's current anticoagulation dose is Warfarin sodium 3 mg tabs: Use as directed by Anticoagualtion Clinic.  The target INR is 2.0-3.0.  The next INR is due 06/22/2010.  Anticoagulation instructions were given to patient.  Results were reviewed/authorized by Weston Brass, PharmD.  She was notified by Weston Brass PharmD.         Prior Anticoagulation Instructions: INR 3.0  Continue same dose of 1 tablet every day except 1/2 tablet on Monday, Wednesday and Friday.  Recheck INR in 4 weeks.   Current Anticoagulation Instructions: INR 1.9  Continue same dose of 1 tablet every day except 1/2 tablet on Monday, Wednesday and Friday.  Recheck INR in 4 weeks.

## 2010-06-09 NOTE — Miscellaneous (Signed)
Summary: Plan/[facility]  Plan/[facility]   Imported By: Lennie Odor 06/01/2010 15:30:59  _____________________________________________________________________  External Attachment:    Type:   Image     Comment:   External Document

## 2010-06-09 NOTE — Progress Notes (Signed)
Summary: Rx refill req  Phone Note Refill Request Message from:  Patient on June 03, 2010 10:47 AM  Refills Requested: Medication #1:  LISINOPRIL 20 MG TABS Take one tablet by mouth two times a day   Dosage confirmed as above?Dosage Confirmed   Supply Requested: 9 months  Medication #2:  SYNTHROID 25 MCG TABS Take one tablet by mouth once daily.   Dosage confirmed as above?Dosage Confirmed   Supply Requested: 9 months  Medication #3:  PROTONIX 40 MG TBEC Take one tablet by mouth once daily.   Dosage confirmed as above?Dosage Confirmed   Supply Requested: 9 months  Method Requested: Electronic Initial call taken by: Margaret Pyle, CMA,  June 03, 2010 10:49 AM    Prescriptions: PROTONIX 40 MG TBEC (PANTOPRAZOLE SODIUM) Take one tablet by mouth once daily.  #90 x 2   Entered by:   Margaret Pyle, CMA   Authorized by:   Newt Lukes MD   Signed by:   Margaret Pyle, CMA on 06/03/2010   Method used:   Faxed to ...       [facility] education [facility])       [address] [address]       [place], [place]  [address]       Ph: [phone]       Fax: [phone]   RxID:   [id number] SYNTHROID 25 MCG TABS (LEVOTHYROXINE SODIUM) Take one tablet by mouth once daily.  #90 x 2   Entered by:   Margaret Pyle, CMA   Authorized by:   Newt Lukes MD   Signed by:   Margaret Pyle, CMA on 06/03/2010   Method used:   Faxed to ...       [facility])       [address] [address]       [place], [place]  [address]       Ph: [phone]       Fax: [phone]   RxID:   [id number] LISINOPRIL 20 MG TABS (LISINOPRIL) Take one tablet by mouth two times a day  #180 x 2   Entered by:   Margaret Pyle, CMA   Authorized by:   Newt Lukes MD   Signed by:   Margaret Pyle, CMA on 06/03/2010   Method used:   Faxed to ...       [facility] education [facility])       [address] [address]       [place], [place]  [address]       Ph:  [phone]       Fax: [phone]   RxID:   [id number]

## 2010-06-14 ENCOUNTER — Encounter (INDEPENDENT_AMBULATORY_CARE_PROVIDER_SITE_OTHER): Payer: Medicare Other

## 2010-06-14 ENCOUNTER — Encounter: Payer: Self-pay | Admitting: Internal Medicine

## 2010-06-14 DIAGNOSIS — I4891 Unspecified atrial fibrillation: Secondary | ICD-10-CM

## 2010-06-15 DIAGNOSIS — I4891 Unspecified atrial fibrillation: Secondary | ICD-10-CM

## 2010-06-16 ENCOUNTER — Encounter: Payer: Self-pay | Admitting: Internal Medicine

## 2010-06-17 NOTE — Miscellaneous (Signed)
Summary: [facility]  [facility]   Imported By: Lester [place] 06/09/2010 08:03:45  _____________________________________________________________________  External Attachment:    Type:   Image     Comment:   External Document

## 2010-06-22 ENCOUNTER — Encounter (INDEPENDENT_AMBULATORY_CARE_PROVIDER_SITE_OTHER): Payer: Medicare Other

## 2010-06-22 ENCOUNTER — Encounter: Payer: Self-pay | Admitting: Cardiovascular Disease

## 2010-06-22 DIAGNOSIS — Z7901 Long term (current) use of anticoagulants: Secondary | ICD-10-CM

## 2010-06-22 DIAGNOSIS — I4891 Unspecified atrial fibrillation: Secondary | ICD-10-CM

## 2010-06-22 LAB — CONVERTED CEMR LAB: POC INR: 2.9

## 2010-06-23 NOTE — Cardiovascular Report (Signed)
Summary: Office Visit   Office Visit   Imported By: Roderic Ovens 06/15/2010 15:39:39  _____________________________________________________________________  External Attachment:    Type:   Image     Comment:   External Document

## 2010-06-23 NOTE — Procedures (Signed)
Summary: pacer ck. [employer] sl/glc   Current Medications (verified): 1)  Lisinopril 20 Mg Tabs (Lisinopril) .... Take One Tablet By Mouth Two Times A Day 2)  Warfarin Sodium 3 Mg Tabs (Warfarin Sodium) .... Use As Directed By Anticoagualtion Clinic 3)  Lopressor 100 Mg Tabs (Metoprolol Tartrate) .... Take One Tablet Once Daily 4)  Synthroid 25 Mcg Tabs (Levothyroxine Sodium) .... Take One Tablet By Mouth Once Daily. 5)  Furosemide 40 Mg Tabs (Furosemide) .... Take One Tablet By Mouth Daily. 6)  K-Tabs 10 Meq Cr-Tabs (Potassium Chloride) .[name].. 1 By Mouth Once Daily 7)  Protonix 40 Mg Tbec (Pantoprazole Sodium) .... Take One Tablet By Mouth Once Daily. 8)  Lecithin 400 Mg Caps (Lecithin) .... Take One Tablet Daily 9)  Vitamin D3 1000 Unit Caps (Cholecalciferol) .[name].. 1 Daily 10)  Folic Acid 800 Mcg Tabs (Folic Acid) .... Once Daily 11)  Cvs Vitamin B12 100 Mcg Tabs (Cyanocobalamin) .... Once Daily 12)  Ferrous Sulfate 325 (65 Fe) Mg Tabs (Ferrous Sulfate) .... 21 Tab Once Daily 13)  Carafate 1 Gm/19ml Susp (Sucralfate) .... Use As Directed 14)  Citalopram Hydrobromide 20 Mg Tabs (Citalopram Hydrobromide) .... Take 1 By Mouth Qd 15)  Fentanyl 75 Mcg/hr Pt72 (Fentanyl) .... Change Every 72 Hours 16)  Co Q-10 100 Mg Caps (Coenzyme Q10) .... Take 1 By Mouth Qd 17)  Fish Oil 300 Mg Caps (Omega-3 Fatty Acids) .... Once Daily 18)  Tylenol Extra Strength 500 Mg Tabs (Acetaminophen) .[name].. 1-2 By Mouth Evry 6 Hours As Needed For Breakthrough Pain 19)  Arava 10 Mg Tabs (Leflunomide) .... Once Daily 20)  Beta Carotene  Crys ([employer]) .... Once Daily 21)  Zinc Magnesium .... Uad 22)  Zofran 4 Mg Tabs (Ondansetron Hcl) .[name].. 1 By Mouth Every 6 Hours As Needed For Nausea or Vomitting 23)  Align  Caps (Probiotic Product) .[name].. 1-2 By Mouth Once Daily  Allergies (verified): 1)  ! Ultram  PPM Specifications Following MD:  Hillis Range, MD     PPM Vendor:  [employer]     PPM Model  Number:  [id number]     PPM Serial Number:  [id number] PPM DOI:  11/25/2005     PPM Implanting MD:  NOT IMPLANTED HERE  Lead 1    Location: RA     DOI: 11/25/2005     Model #: [id number]     Serial #: [id number]     Status: active Lead 2    Location: RV     DOI: 11/25/2005     Model #: [id number]     Serial #: [id number]     Status: active  Magnet Response Rate:  BOL100 ERI  85  Indications:  AFIB   PPM Follow Up Pacer Dependent:  Yes      Episodes Coumadin:  Yes  Parameters Mode:  DDDR     Lower Rate Limit:  60     Upper Rate Limit:  130 Paced AV Delay:  210     Sensed AV Delay:  300 Tech Comments:  See PaceArt

## 2010-06-29 NOTE — Medication Information (Signed)
Summary: rov/sp  Anticoagulant Therapy  Managed by: Weston Brass, PharmD Referring MD: Johney Frame MD, Fayrene Fearing PCP: Newt Lukes MD Supervising MD: Excell Seltzer MD, Casimiro Needle Indication 1: Atrial Fibrillation (chronic) Lab Used: [facility] [place] Site: [address] INR POC 2.9 INR RANGE 2.0-3.0  Dietary changes: no    Health status changes: no    Bleeding/hemorrhagic complications: no    Recent/future hospitalizations: no    Any changes in medication regimen? no    Recent/future dental: no  Any missed doses?: no       Is patient compliant with meds? yes       Allergies: 1)  ! Ultram  Anticoagulation Management History:      The patient is taking warfarin and comes in today for a routine follow up visit.  Positive risk factors for bleeding include an age of 22 years or older.  The bleeding index is 'intermediate risk'.  Positive CHADS2 values include History of CHF, History of HTN, and Age > 69 years old.  Her last INR was 3.5.  Anticoagulation responsible provider: Excell Seltzer MD, Casimiro Needle.  INR POC: 2.9.  Cuvette Lot#: [id number].  Exp: 05/2011.    Anticoagulation Management Assessment/Plan:      The patient's current anticoagulation dose is Warfarin sodium 3 mg tabs: Use as directed by Anticoagualtion Clinic.  The target INR is 2.0-3.0.  The next INR is due 07/20/2010.  Anticoagulation instructions were given to patient.  Results were reviewed/authorized by Weston Brass, PharmD.  She was notified by Margot Chimes PharmD Candidate.         Prior Anticoagulation Instructions: INR 1.9  Continue same dose of 1 tablet every day except 1/2 tablet on Monday, Wednesday and Friday.  Recheck INR in 4 weeks.   Current Anticoagulation Instructions: INR 2.9  Continue to take 1 tablet everyday except for Mondays, Wednesdays, and Fridays when you take 1/2 tablet.  Recheck INR in 4 weeks.

## 2010-06-29 NOTE — Miscellaneous (Signed)
Summary: Treatment plan for [facility]  Treatment plan for [facility]   Imported By: Sherian Rein 06/21/2010 08:20:21  _____________________________________________________________________  External Attachment:    Type:   Image     Comment:   External Document

## 2010-07-17 LAB — CARDIAC PANEL(CRET KIN+CKTOT+MB+TROPI)
CK, MB: 10.6 ng/mL (ref 0.3–4.0)
Total CK: 339 U/L — ABNORMAL HIGH (ref 7–177)
Total CK: 343 U/L — ABNORMAL HIGH (ref 7–177)
Troponin I: 0.01 ng/mL (ref 0.00–0.06)

## 2010-07-17 LAB — POCT I-STAT, CHEM 8
Calcium, Ion: 1.03 mmol/L — ABNORMAL LOW (ref 1.12–1.32)
Creatinine, Ser: 1.6 mg/dL — ABNORMAL HIGH (ref 0.4–1.2)
Glucose, Bld: 102 mg/dL — ABNORMAL HIGH (ref 70–99)
HCT: 31 % — ABNORMAL LOW (ref 36.0–46.0)
Hemoglobin: 10.5 g/dL — ABNORMAL LOW (ref 12.0–15.0)
TCO2: 22 mmol/L (ref 0–100)

## 2010-07-17 LAB — D-DIMER, QUANTITATIVE: D-Dimer, Quant: <0.22 ug/mL-FEU (ref 0.00–0.48)

## 2010-07-17 LAB — LIPID PANEL
Cholesterol: 140 mg/dL (ref 0–200)
HDL: 64 mg/dL (ref >39)
LDL Cholesterol: 54 mg/dL (ref 0–99)
Triglycerides: 110 mg/dL (ref <150)
VLDL: 22 mg/dL (ref 0–40)

## 2010-07-17 LAB — CBC
HCT: 29.7 % — ABNORMAL LOW (ref 36.0–46.0)
Hemoglobin: 9.9 g/dL — ABNORMAL LOW (ref 12.0–15.0)
MCH: 35.2 pg — ABNORMAL HIGH (ref 26.0–34.0)
MCHC: 33.3 g/dL (ref 30.0–36.0)
Platelets: 102 10*3/uL — ABNORMAL LOW (ref 150–400)
RBC: 2.91 MIL/uL — ABNORMAL LOW (ref 3.87–5.11)
RDW: 15.9 % — ABNORMAL HIGH (ref 11.5–15.5)
WBC: 5.8 10*3/uL (ref 4.0–10.5)
WBC: 6.1 10*3/uL (ref 4.0–10.5)

## 2010-07-17 LAB — BRAIN NATRIURETIC PEPTIDE: Pro B Natriuretic peptide (BNP): 290 pg/mL — ABNORMAL HIGH (ref 0.0–100.0)

## 2010-07-17 LAB — PROTIME-INR
INR: 2.62 — ABNORMAL HIGH (ref 0.00–1.49)
Prothrombin Time: 27.8 seconds — ABNORMAL HIGH (ref 11.6–15.2)

## 2010-07-17 LAB — TROPONIN I: Troponin I: 0.02 ng/mL (ref 0.00–0.06)

## 2010-07-17 LAB — CK TOTAL AND CKMB (NOT AT ARMC)
CK, MB: 11.2 ng/mL (ref 0.3–4.0)
Relative Index: 3.3 — ABNORMAL HIGH (ref 0.0–2.5)

## 2010-07-17 LAB — PHOSPHORUS: Phosphorus: 4.1 mg/dL (ref 2.3–4.6)

## 2010-07-17 LAB — BASIC METABOLIC PANEL
CO2: 23 mEq/L (ref 19–32)
Calcium: 8.5 mg/dL (ref 8.4–10.5)
Chloride: 106 mEq/L (ref 96–112)
Creatinine, Ser: 1.1 mg/dL (ref 0.4–1.2)
GFR calc Af Amer: 57 mL/min — ABNORMAL LOW (ref >60)
GFR calc non Af Amer: 47 mL/min — ABNORMAL LOW (ref >60)

## 2010-07-17 LAB — TSH: TSH: 3.294 u[IU]/mL (ref 0.350–4.500)

## 2010-07-17 LAB — POCT CARDIAC MARKERS: CKMB, poc: 7.4 ng/mL (ref 1.0–8.0)

## 2010-07-20 ENCOUNTER — Ambulatory Visit (INDEPENDENT_AMBULATORY_CARE_PROVIDER_SITE_OTHER): Payer: Medicare Other | Admitting: *Deleted

## 2010-07-20 DIAGNOSIS — Z7901 Long term (current) use of anticoagulants: Secondary | ICD-10-CM | POA: Insufficient documentation

## 2010-07-20 DIAGNOSIS — I4891 Unspecified atrial fibrillation: Secondary | ICD-10-CM

## 2010-07-20 LAB — POCT INR: INR: 4.7

## 2010-07-20 NOTE — Patient Instructions (Addendum)
Skip today and tomorrow's dosage of Coumadin, then start taking 1/2 tablet daily except 1 tablet on Tuesdays and Saturdays.  Recheck in 10 days.

## 2010-07-30 ENCOUNTER — Encounter: Payer: Medicare Other | Admitting: *Deleted

## 2010-08-02 ENCOUNTER — Ambulatory Visit (INDEPENDENT_AMBULATORY_CARE_PROVIDER_SITE_OTHER): Payer: Medicare Other | Admitting: *Deleted

## 2010-08-02 DIAGNOSIS — I4891 Unspecified atrial fibrillation: Secondary | ICD-10-CM

## 2010-08-02 DIAGNOSIS — Z7901 Long term (current) use of anticoagulants: Secondary | ICD-10-CM

## 2010-08-02 NOTE — Patient Instructions (Addendum)
INR 4.3 Skip today and tomorrow's dosage of Coumadin, then start taking 1/2 tablet daily except 1 tablet (3mg ) on Tuesdays.  Recheck in 10 days.

## 2010-08-12 ENCOUNTER — Encounter: Payer: Medicare Other | Admitting: *Deleted

## 2010-08-13 ENCOUNTER — Encounter: Payer: Medicare Other | Admitting: *Deleted

## 2010-08-17 ENCOUNTER — Ambulatory Visit (INDEPENDENT_AMBULATORY_CARE_PROVIDER_SITE_OTHER): Payer: Medicare Other | Admitting: *Deleted

## 2010-08-17 DIAGNOSIS — I4891 Unspecified atrial fibrillation: Secondary | ICD-10-CM

## 2010-08-17 LAB — POCT INR: INR: 2.6

## 2010-08-31 ENCOUNTER — Encounter: Payer: Self-pay | Admitting: Internal Medicine

## 2010-09-01 ENCOUNTER — Encounter: Payer: Self-pay | Admitting: Internal Medicine

## 2010-09-01 ENCOUNTER — Ambulatory Visit (INDEPENDENT_AMBULATORY_CARE_PROVIDER_SITE_OTHER): Payer: Medicare Other | Admitting: Internal Medicine

## 2010-09-01 ENCOUNTER — Other Ambulatory Visit (INDEPENDENT_AMBULATORY_CARE_PROVIDER_SITE_OTHER): Payer: Medicare Other

## 2010-09-01 ENCOUNTER — Telehealth: Payer: Self-pay | Admitting: Internal Medicine

## 2010-09-01 ENCOUNTER — Other Ambulatory Visit (INDEPENDENT_AMBULATORY_CARE_PROVIDER_SITE_OTHER): Payer: Medicare Other | Admitting: Internal Medicine

## 2010-09-01 DIAGNOSIS — E039 Hypothyroidism, unspecified: Secondary | ICD-10-CM

## 2010-09-01 DIAGNOSIS — N390 Urinary tract infection, site not specified: Secondary | ICD-10-CM

## 2010-09-01 DIAGNOSIS — R609 Edema, unspecified: Secondary | ICD-10-CM

## 2010-09-01 DIAGNOSIS — I10 Essential (primary) hypertension: Secondary | ICD-10-CM

## 2010-09-01 LAB — URINALYSIS, ROUTINE W REFLEX MICROSCOPIC
Bilirubin Urine: NEGATIVE
Ketones, ur: NEGATIVE
Urine Glucose: NEGATIVE
pH: 5 (ref 5.0–8.0)

## 2010-09-01 LAB — CBC WITH DIFFERENTIAL/PLATELET
Basophils Absolute: 0 10*3/uL (ref 0.0–0.1)
Basophils Relative: 0.2 % (ref 0.0–3.0)
Eosinophils Absolute: 0.1 10*3/uL (ref 0.0–0.7)
HCT: 33.8 % — ABNORMAL LOW (ref 36.0–46.0)
Hemoglobin: 11.1 g/dL — ABNORMAL LOW (ref 12.0–15.0)
Lymphs Abs: 1.3 10*3/uL (ref 0.7–4.0)
MCHC: 33 g/dL (ref 30.0–36.0)
Neutro Abs: 6 10*3/uL (ref 1.4–7.7)
RBC: 3.14 Mil/uL — ABNORMAL LOW (ref 3.87–5.11)
RDW: 20.2 % — ABNORMAL HIGH (ref 11.5–14.6)

## 2010-09-01 LAB — BASIC METABOLIC PANEL
CO2: 32 mEq/L (ref 19–32)
Chloride: 102 mEq/L (ref 96–112)
Glucose, Bld: 87 mg/dL (ref 70–99)
Potassium: 4.5 mEq/L (ref 3.5–5.1)
Sodium: 142 mEq/L (ref 135–145)

## 2010-09-01 MED ORDER — VITAMIN B-12 500 MCG PO TABS: 500.0000 ug | ORAL_TABLET | Freq: Every day | ORAL | Status: DC

## 2010-09-01 MED ORDER — SULFAMETHOXAZOLE-TRIMETHOPRIM 800-160 MG PO TABS: 1.0000 | ORAL_TABLET | Freq: Two times a day (BID) | ORAL | Status: DC

## 2010-09-01 NOTE — Assessment & Plan Note (Signed)
The current medical regimen is effective;  continue present plan and medications. BP Readings from Last 3 Encounters:  09/01/10 120/68  05/12/10 164/90  03/18/10 110/62

## 2010-09-01 NOTE — Progress Notes (Signed)
Subjective:    Patient ID: Nicole Roberts, female    DOB: [birth year], 77 y.o.   MRN: [id number]  HPI  multiple concerns elev BP despite med compliance, no changes in rx'd med/dose also chills and diffuse myalgias  onset 24h ago - "perfectly well yesterday AM" denies HA, ST, cough, sinus pressure, sneeze, swelling of joints and rash improved with extra tylenol but mild nausea, no vomiiting or diarrhea, no abd pain   also reviewed chronic med issues: arthritis - clarified as RA after meeting with rheum 10/2009 - prev taking darvocet  until removed from market -  also on fentanyl patch for pain control - primary pain symptoms are in shoulders and prox thighs but +pain "all over everywhere" -- back on steroids since 07/2010 - no weakness, no leg or back pain (also hx spinal stenosis noted) - no joint swelling   HTN -reports compliance with ongoing medical treatment and no changes in medication dose or frequency. denies adverse side effects related to current therapy.   dyslipidemia - on fish oil but no statin due to persistingly inc CK 11/2009(lipitor stopped 10/2009 due to inc CK in hosp) - follows with cards for same  edema - chronic problem in both legs but reports worse since moving to [place] end of 2010- not a/w SOB or abd swelling - associated with weight gain into 160s# - concern kidneys or liver not working well - has new set of compression hose which seem effective per pt  hypothyroid - reports compliance with ongoing medical treatment and no changes in medication dose or frequency. denies adverse side effects related to current therapy. no weight changes or bowel or skin changes  also c/o dysura- inc ur freq, esp nocturia - mild burn, small vol voids - denies abd pain or fever -hx same  Past Medical History  Diagnosis Date  . HEARING LOSS   . MITRAL VALVE INSUFF&AORTIC VALVE INSUFF   . PULMONARY HYPERTENSION   . Atrial fibrillation     chronic anticoag - coumadin  . SICK SINUS  SYNDROME     PPM 2005 for syncope  . Edema 03/19/2009  . URINARY INCONTINENCE   . PACEMAKER, PERMANENT 2005  . Rheumatoid arthritis   . BREAST CANCER 12/2006    ductal ca s/p L lumpectomy  . CHF   . HYPERTENSION   . HYPERLIPIDEMIA   . GERD   . HYPOTHYROIDISM     Review of Systems  Constitutional: Positive for fatigue. Negative for fever.  Respiratory: Negative for cough.   Cardiovascular: Negative for chest pain.  Musculoskeletal: Negative for joint swelling.  Neurological: Negative for headaches.       Objective:   Physical Exam  Constitutional: She is oriented to person, place, and time. She appears well-developed and well-nourished. No distress.  Cardiovascular: Normal rate and normal heart sounds.        2+ pitting edema both legs  Pulmonary/Chest: Effort normal and breath sounds normal. No respiratory distress. She has no wheezes.  Neurological: She is alert and oriented to person, place, and time. No cranial nerve deficit. Coordination normal.  Skin: Skin is warm and dry. No rash noted. No erythema.  Psychiatric: She has a normal mood and affect. Her behavior is normal. Judgment and thought content normal.  BP 120/68  Pulse 93  Temp(Src) 98.3 F (36.8 C) (Oral)  Ht 5' 1.5" (1.562 m)  Wt 163 lb (73.936 kg)  BMI 30.30 kg/m2  SpO2 95%  Wt Readings from Last 3 Encounters:  09/01/10 163 lb (73.936 kg)  03/18/10 151 lb 1.9 oz (68.547 kg)  03/10/10 151 lb 4 oz (68.607 kg)   Lab Results  Component Value Date   WBC 5.3 05/12/2010   HGB 10.6* 05/12/2010   HCT 30.8* 05/12/2010   PLT 129.0* 05/12/2010   CHOL 202* 12/28/2009   TRIG 120.0 12/28/2009   HDL 54.10 12/28/2009   LDLDIRECT 131.4 12/28/2009   ALT 26 05/12/2010   AST 29 05/12/2010   NA 140 05/12/2010   K 4.2 05/12/2010   CL 107 05/12/2010   CREATININE 0.7 05/12/2010   BUN 14 05/12/2010   CO2 25 05/12/2010   TSH 3.294 11/23/2009   INR 2.6 08/17/2010       Assessment & Plan:  See problem list. Medications and labs  reviewed today.

## 2010-09-01 NOTE — Telephone Encounter (Signed)
Please call patient - UA with UTI, blood shows normal or stable test results (sugar, kidneys, thyroid ok). The following medication changes recommended: septra antibiotics 2x/d for 5 days (erx done) - needs to talk with rheum about reducing prednisone as much as possible to help with swelling and other symptoms - Thanks.  Lab Results  Component Value Date   WBC 8.4 09/01/2010   HGB 11.1* 09/01/2010   HCT 33.8* 09/01/2010   PLT 141.0* 09/01/2010   CHOL 202* 12/28/2009   TRIG 120.0 12/28/2009   HDL 54.10 12/28/2009   LDLDIRECT 131.4 12/28/2009   ALT 26 05/12/2010   AST 29 05/12/2010   NA 142 09/01/2010   K 4.5 09/01/2010   CL 102 09/01/2010   CREATININE 1.2 09/01/2010   BUN 35* 09/01/2010   CO2 32 09/01/2010   TSH 0.81 09/01/2010   INR 2.6 08/17/2010

## 2010-09-01 NOTE — Assessment & Plan Note (Signed)
On low dose synthroid - recheck TSH now, adjust as needed Lab Results  Component Value Date   TSH 3.294 11/23/2009

## 2010-09-01 NOTE — Patient Instructions (Signed)
It was good to see you today. Medications reviewed, no changes at this time. Ok to increase B12 pill to daily (OTC) Talk with dr. Dareen Piano about weaning down or getting off prednisone Test(s) ordered today. Your results will be called to you after review (48-72hours after test completion). If any changes need to be made, you will be notified at that time. Will consider referral to endocrinology depending on these results Please schedule followup in 6 months for routine check, call sooner if problems.

## 2010-09-01 NOTE — Assessment & Plan Note (Signed)
Check UA - tx if evidence for Infx

## 2010-09-01 NOTE — Assessment & Plan Note (Signed)
Weight gain likely related to increase edema - ?exac by pred dose by rheum - on bid since 07/2010 Check labs as noted, consider taper off pred as rheum symptoms tolerate

## 2010-09-02 NOTE — Telephone Encounter (Signed)
Pt Notified with  Lab results & md recommendations...09/02/10@8 :59am/LMB

## 2010-09-05 ENCOUNTER — Other Ambulatory Visit: Payer: Self-pay | Admitting: Internal Medicine

## 2010-09-06 ENCOUNTER — Telehealth: Payer: Self-pay

## 2010-09-06 MED ORDER — CIPROFLOXACIN HCL 250 MG PO TABS: 250.0000 mg | ORAL_TABLET | Freq: Two times a day (BID) | ORAL | Status: AC

## 2010-09-06 NOTE — Telephone Encounter (Signed)
Pt called stating that she was prescribed Septra which is a sulfa drug. Pt states she is allergic to Sulfa drugs and is requesting an alternate ABX to treat UTI, please advise. Okay to add sulfa to allergy list once pt can advised adverse reaction?

## 2010-09-06 NOTE — Telephone Encounter (Signed)
Per pharmacy, they also did not have sulfa drugs on pt's profile as a drug allergy. Pharmacy updated their records as well.

## 2010-09-06 NOTE — Telephone Encounter (Signed)
Noted - septra removed from list - please check will pt/pharmacy re: ? other allergies and i will change to alt abx - thanks

## 2010-09-06 NOTE — Telephone Encounter (Signed)
5d cipro abx erx done for tx UTI - cipro may cause mild risk elevation in warfarin levels so should contact her coumadin clinic if any change in bruising/oozing while on abx - but i feel risk/outweight benefit for tx of UTI given other abx allergies (allg list updated) - thanks

## 2010-09-07 ENCOUNTER — Encounter: Payer: Medicare Other | Admitting: *Deleted

## 2010-09-07 NOTE — Telephone Encounter (Signed)
Pt advised in detail and will advised coumadin clinic of same. Labs mailed to pt's home per pt request

## 2010-09-13 ENCOUNTER — Ambulatory Visit (INDEPENDENT_AMBULATORY_CARE_PROVIDER_SITE_OTHER): Payer: Medicare Other | Admitting: *Deleted

## 2010-09-13 ENCOUNTER — Telehealth: Payer: Self-pay

## 2010-09-13 DIAGNOSIS — I4891 Unspecified atrial fibrillation: Secondary | ICD-10-CM

## 2010-09-13 LAB — POCT INR: INR: 2.5

## 2010-09-13 MED ORDER — CIPROFLOXACIN HCL 250 MG PO TABS: 250.0000 mg | ORAL_TABLET | Freq: Two times a day (BID) | ORAL | Status: AC

## 2010-09-13 NOTE — Telephone Encounter (Signed)
Pt advised of MD recommendation and Rx refill via home Vm

## 2010-09-13 NOTE — Telephone Encounter (Signed)
Pt called stating she has completed ABX course of Cipro for UTI but she is still having urinary frequency and lower back pain. Pt is requesting refill of ABX, please advise.

## 2010-09-13 NOTE — Telephone Encounter (Signed)
Will refill cipro once (erx done) - but if persisitng urine problems after this 2nd course, will need uro eval for same  - thanks

## 2010-09-14 ENCOUNTER — Other Ambulatory Visit: Payer: Self-pay

## 2010-09-14 MED ORDER — PANTOPRAZOLE SODIUM 40 MG PO TBEC: 40.0000 mg | DELAYED_RELEASE_TABLET | Freq: Every day | ORAL | Status: DC

## 2010-09-14 MED ORDER — LEVOTHYROXINE SODIUM 25 MCG PO TABS: 25.0000 ug | ORAL_TABLET | Freq: Every day | ORAL | Status: DC

## 2010-09-14 MED ORDER — METOPROLOL TARTRATE 100 MG PO TABS: 100.0000 mg | ORAL_TABLET | Freq: Every day | ORAL | Status: DC

## 2010-09-24 ENCOUNTER — Other Ambulatory Visit: Payer: Self-pay

## 2010-09-24 ENCOUNTER — Telehealth: Payer: Self-pay

## 2010-09-24 DIAGNOSIS — E039 Hypothyroidism, unspecified: Secondary | ICD-10-CM

## 2010-09-24 DIAGNOSIS — M25512 Pain in left shoulder: Secondary | ICD-10-CM

## 2010-09-24 MED ORDER — POTASSIUM CHLORIDE ER 10 MEQ PO TBCR: 10.0000 meq | EXTENDED_RELEASE_TABLET | Freq: Every day | ORAL | Status: DC

## 2010-09-24 NOTE — Telephone Encounter (Signed)
Pt advised of referrals, will expect a call from [facility] with appt info

## 2010-09-24 NOTE — Telephone Encounter (Signed)
Pt called requesting referral to Ortho and Endo

## 2010-09-24 NOTE — Telephone Encounter (Signed)
Ok referrals done as requested

## 2010-09-29 ENCOUNTER — Other Ambulatory Visit: Payer: Self-pay | Admitting: Internal Medicine

## 2010-10-11 ENCOUNTER — Ambulatory Visit (INDEPENDENT_AMBULATORY_CARE_PROVIDER_SITE_OTHER): Payer: Medicare Other | Admitting: *Deleted

## 2010-10-11 DIAGNOSIS — I4891 Unspecified atrial fibrillation: Secondary | ICD-10-CM

## 2010-10-20 ENCOUNTER — Telehealth: Payer: Self-pay

## 2010-10-20 DIAGNOSIS — C50919 Malignant neoplasm of unspecified site of unspecified female breast: Secondary | ICD-10-CM

## 2010-10-20 NOTE — Telephone Encounter (Signed)
Pt's daughter called requesting referral to [facility] for a Diagnostic mammogram for pt.

## 2010-10-20 NOTE — Telephone Encounter (Signed)
Order done as per request

## 2010-10-21 NOTE — Telephone Encounter (Signed)
Pt's daughter advised via home VM

## 2010-10-29 ENCOUNTER — Telehealth: Payer: Self-pay

## 2010-10-29 MED ORDER — METRONIDAZOLE 500 MG PO TABS: 500.0000 mg | ORAL_TABLET | Freq: Three times a day (TID) | ORAL | Status: AC

## 2010-10-29 NOTE — Telephone Encounter (Signed)
Pt called stating she has been experiencing diarrhea x 3 days, no abd pain/cramps, fever. Pt is requesting Rx to treat as she is unable to come in for OV, she has no transportation. Please advise.

## 2010-10-29 NOTE — Telephone Encounter (Signed)
i recommended OTC immodium 1-2 tabs every 4 hours prn for diarrhea, max 8tabs/day - will also send 1 week metronidazole antibiotics as done 03/2010, but if unimproved, or worse - needs OV - thanks

## 2010-10-29 NOTE — Telephone Encounter (Signed)
Pt advised of Rx and OTC medication

## 2010-11-01 ENCOUNTER — Telehealth: Payer: Self-pay

## 2010-11-01 NOTE — Telephone Encounter (Signed)
Pt called stating ABX has improved loose stools but it has not completely resolved. Pt is requesting advisement from MD on medication she can take to help, please advise.

## 2010-11-01 NOTE — Telephone Encounter (Signed)
As advised Friday - needs to take OTC immodium - up to 8 tab/24h for loose stool - if taking 8/day and still loose, needs OV or GI eval - thanks!

## 2010-11-02 NOTE — Telephone Encounter (Signed)
Pt advised and states she will continue with medication for a few more days and if it still does not resolve she will call back to schedule office visit with VAL.

## 2010-11-04 ENCOUNTER — Encounter: Payer: Self-pay | Admitting: Internal Medicine

## 2010-11-04 LAB — HM MAMMOGRAPHY

## 2010-11-08 ENCOUNTER — Ambulatory Visit (INDEPENDENT_AMBULATORY_CARE_PROVIDER_SITE_OTHER): Payer: Medicare Other | Admitting: *Deleted

## 2010-11-08 DIAGNOSIS — I4891 Unspecified atrial fibrillation: Secondary | ICD-10-CM

## 2010-11-08 LAB — POCT INR: INR: 2.4

## 2010-11-15 ENCOUNTER — Encounter: Payer: Self-pay | Admitting: Internal Medicine

## 2010-11-18 ENCOUNTER — Encounter: Payer: Self-pay | Admitting: Internal Medicine

## 2010-11-22 ENCOUNTER — Encounter: Payer: Self-pay | Admitting: Internal Medicine

## 2010-11-29 ENCOUNTER — Encounter: Payer: Self-pay | Admitting: Internal Medicine

## 2010-12-06 ENCOUNTER — Ambulatory Visit (INDEPENDENT_AMBULATORY_CARE_PROVIDER_SITE_OTHER): Payer: Medicare Other | Admitting: *Deleted

## 2010-12-06 DIAGNOSIS — I4891 Unspecified atrial fibrillation: Secondary | ICD-10-CM

## 2010-12-15 ENCOUNTER — Ambulatory Visit (INDEPENDENT_AMBULATORY_CARE_PROVIDER_SITE_OTHER): Payer: Medicare Other | Admitting: Internal Medicine

## 2010-12-15 ENCOUNTER — Encounter: Payer: Self-pay | Admitting: Internal Medicine

## 2010-12-15 DIAGNOSIS — R609 Edema, unspecified: Secondary | ICD-10-CM

## 2010-12-15 DIAGNOSIS — I495 Sick sinus syndrome: Secondary | ICD-10-CM

## 2010-12-15 DIAGNOSIS — I4891 Unspecified atrial fibrillation: Secondary | ICD-10-CM

## 2010-12-15 DIAGNOSIS — Z950 Presence of cardiac pacemaker: Secondary | ICD-10-CM

## 2010-12-15 LAB — PACEMAKER DEVICE OBSERVATION
AL AMPLITUDE: 0.3 mv
AL IMPEDENCE PM: 540 Ohm
BAMS-0001: 160 {beats}/min
BAMS-0002: 0 ms
BAMS-0003: 70 {beats}/min
RV LEAD AMPLITUDE: 11.7 mv
VENTRICULAR PACING PM: 39

## 2010-12-15 NOTE — Progress Notes (Signed)
The patient presents today for routine electrophysiology followup.  Since last being seen in our clinic, the patient reports doing very well.  She has chronic difficulty with venous insufficiency.  Today, she denies symptoms of palpitations, chest pain, shortness of breath, orthopnea, PND, dizziness, presyncope, syncope, or neurologic sequela.  The patient feels that she is tolerating medications without difficulties and is otherwise without complaint today.   Past Medical History  Diagnosis Date  . HEARING LOSS   . MITRAL VALVE INSUFF&AORTIC VALVE INSUFF     moderate MR  . PULMONARY HYPERTENSION   . Atrial fibrillation     permanent  . SICK SINUS SYNDROME     PPM 2005 for syncope  . Edema 03/19/2009    due to venous insufficiency  . URINARY INCONTINENCE   . Rheumatoid arthritis   . BREAST CANCER 12/2006    ductal ca s/p L lumpectomy  . Diastolic dysfunction   . HYPERTENSION   . HYPERLIPIDEMIA   . GERD   . HYPOTHYROIDISM   . Hyperparathyroidism   . Venous insufficiency   . Spinal stenosis   . Anemia   . History of colonoscopy    Past Surgical History  Procedure Date  . Pacemaker placement 2005    SSS and syncope  . Cholecystectomy 04/06/99  . Abdominal hysterectomy 1970    Partial  . Left hip arthroscopy 1995  . Right hip arthroscopy 01/2001  . Carpal tunnel release 1998  . Breast surgery 12/2006    Left breast lupectomy- Ductal CA  . Left parathyroidectomy 07/2008    Current Outpatient Prescriptions  Medication Sig Dispense Refill  . acetaminophen (TYLENOL) 500 MG tablet Take 500 mg by mouth every 6 (six) hours as needed.        [employer] (BETA CAROTENE) CRYS by Does not apply route daily.        . Cholecalciferol (VITAMIN D3) 1000 UNITS CAPS Take by mouth daily.        . citalopram (CELEXA) 20 MG tablet Take 20 mg by mouth daily.        . Coenzyme Q10 (CO Q10) 100 MG TABS Take by mouth daily.        . cyanocobalamin (CYANOCOBALAMIN) 500 MCG tablet Take 1 tablet  (500 mcg total) by mouth daily.  90 tablet  3  . fentaNYL (DURAGESIC - DOSED MCG/HR) 75 MCG/HR Place 1 patch onto the skin every 3 (three) days.        . ferrous sulfate 325 (65 FE) MG tablet Take 325 mg by mouth daily with breakfast.        . folic acid (FOLVITE) 800 MCG tablet Take 400 mcg by mouth daily.        . furosemide (LASIX) 40 MG tablet Take 40 mg by mouth daily.        . Lecithin 400 MG CAPS Take by mouth daily.        [name] leflunomide (ARAVA) 10 MG tablet Take 10 mg by mouth daily.        [name] levothyroxine (SYNTHROID, LEVOTHROID) 25 MCG tablet Take 1 tablet (25 mcg total) by mouth daily.  90 tablet  1  . lisinopril (PRINIVIL,ZESTRIL) 20 MG tablet Take 20 mg by mouth 2 (two) times daily.        . metoprolol (LOPRESSOR) 100 MG tablet Take 1 tablet (100 mg total) by mouth daily.  90 tablet  1  . Omega-3 Fatty Acids (FISH OIL) 1000 MG CPDR Take by mouth daily.        [name]  ondansetron (ZOFRAN) 4 MG tablet TAKE 1 TABLET BY MOUTH EVERY 6 HOURS AS NEEDED FOR NAUSEA OR VOMITING  30 tablet  1  . pantoprazole (PROTONIX) 40 MG tablet Take 1 tablet (40 mg total) by mouth daily.  90 tablet  1  . potassium chloride (K-DUR) 10 MEQ tablet Take 1 tablet (10 mEq total) by mouth daily.  90 tablet  1  . Probiotic Product (ALIGN) 4 MG CAPS Take by mouth daily.        . sucralfate (CARAFATE) 1 GM/10ML suspension Take 1 g by mouth as directed.        . warfarin (COUMADIN) 3 MG tablet USE AS DIRECTED BY ANTICOAGUALTION CLINIC  30 tablet  2  . zinc sulfate 220 MG capsule Take 220 mg by mouth daily.        . predniSONE (DELTASONE) 10 MG tablet Take 5 mg by mouth daily.         Allergies  Allergen Reactions  . Sulfa Antibiotics   . Tramadol Hcl     History   Social History  . Marital Status: Married    Spouse Name: N/A    Number of Children: N/A  . Years of Education: N/A   Occupational History  . Retired Futures trader    Social History Main Topics  . Smoking status: Never Smoker   . Smokeless tobacco:  Not on file   Comment: Lives at [facility] since 03/2009- married, lives with spouse. Moved here from [facility] to be near son  . Alcohol Use: No     rare  . Drug Use: No  . Sexually Active: Not on file   Other Topics Concern  . Not on file   Social History Narrative  . No narrative on file    Family History  Problem Relation Age of Onset  . Ovarian cancer Mother   . Coronary artery disease Sister   . Coronary artery disease Brother   . Diabetes Other   . Hyperlipidemia Other     parent, grandparent  . Hypertension Other     parent, grandparent  . Lung cancer Other     parent   Physical Exam: Filed Vitals:   12/15/10 1340  BP: 133/78  Pulse: 85  Height: 5\' 4"  (1.626 m)  Weight: 172 lb (78.019 kg)    GEN- The patient is well appearing, alert and oriented x 3 today.   Head- normocephalic, atraumatic Eyes-  Sclera clear, conjunctiva pink Ears- hearing intact Oropharynx- clear Neck- supple, no JVP Lymph- no cervical lymphadenopathy Lungs- Clear to ausculation bilaterally, normal work of breathing Chest- pacemaker pocket is well healed Heart- irregular rate and rhythm GI- soft, NT, ND, + BS Extremities- no clubbing, cyanosis, 2+ pedal edema with marked venous insufficiency  Pacemaker interrogation- reviewed in detail today,  See PACEART report  Assessment and Plan:

## 2010-12-15 NOTE — Assessment & Plan Note (Signed)
Salt restriction Due to venous insufficiency I have prescribed a stronger support hose today I do not feel that this would improve with more aggressive diuresis.

## 2010-12-15 NOTE — Patient Instructions (Addendum)
Your physician wants you to follow-up in: 6 months in device clinic You will receive a reminder letter in the mail two months in advance. If you don't receive a letter, please call our office to schedule the follow-up appointment.

## 2010-12-15 NOTE — Assessment & Plan Note (Signed)
Permanent atrial fibrillation Continue coumadin long term Rate is controlled

## 2010-12-15 NOTE — Assessment & Plan Note (Signed)
Normal pacemaker function See Arita Miss Art report I have switched to VVIR today.

## 2010-12-17 ENCOUNTER — Other Ambulatory Visit: Payer: Self-pay | Admitting: *Deleted

## 2010-12-17 MED ORDER — ONDANSETRON HCL 4 MG PO TABS: 4.0000 mg | ORAL_TABLET | Freq: Three times a day (TID) | ORAL | Status: DC | PRN

## 2010-12-20 ENCOUNTER — Telehealth: Payer: Self-pay | Admitting: Internal Medicine

## 2010-12-20 MED ORDER — ATORVASTATIN CALCIUM 10 MG PO TABS: 10.0000 mg | ORAL_TABLET | Freq: Every day | ORAL | Status: DC

## 2010-12-20 NOTE — Telephone Encounter (Signed)
Returning call back to nurse from Friday.

## 2010-12-24 ENCOUNTER — Other Ambulatory Visit: Payer: Self-pay | Admitting: *Deleted

## 2010-12-24 MED ORDER — POTASSIUM CHLORIDE ER 10 MEQ PO TBCR: 10.0000 meq | EXTENDED_RELEASE_TABLET | Freq: Every day | ORAL | Status: DC

## 2010-12-24 MED ORDER — METOPROLOL TARTRATE 100 MG PO TABS: 100.0000 mg | ORAL_TABLET | Freq: Every day | ORAL | Status: DC

## 2010-12-24 MED ORDER — PANTOPRAZOLE SODIUM 40 MG PO TBEC: 40.0000 mg | DELAYED_RELEASE_TABLET | Freq: Every day | ORAL | Status: DC

## 2010-12-24 MED ORDER — LISINOPRIL 20 MG PO TABS: 20.0000 mg | ORAL_TABLET | Freq: Two times a day (BID) | ORAL | Status: DC

## 2010-12-24 NOTE — Telephone Encounter (Signed)
Received fax stating pt is needing rx for protonix & klor con until mail order services come in. Sending request back to [facility]....12/24/10@8 :55am/LMB

## 2010-12-28 ENCOUNTER — Ambulatory Visit (INDEPENDENT_AMBULATORY_CARE_PROVIDER_SITE_OTHER): Payer: Medicare Other | Admitting: *Deleted

## 2010-12-28 DIAGNOSIS — I4891 Unspecified atrial fibrillation: Secondary | ICD-10-CM

## 2010-12-28 LAB — POCT INR: INR: 3.5

## 2011-01-13 ENCOUNTER — Ambulatory Visit (INDEPENDENT_AMBULATORY_CARE_PROVIDER_SITE_OTHER): Payer: Medicare Other | Admitting: *Deleted

## 2011-01-13 DIAGNOSIS — I4891 Unspecified atrial fibrillation: Secondary | ICD-10-CM

## 2011-01-13 LAB — POCT INR: INR: 2.7

## 2011-01-18 ENCOUNTER — Other Ambulatory Visit: Payer: Self-pay | Admitting: *Deleted

## 2011-01-18 MED ORDER — FUROSEMIDE 40 MG PO TABS: 40.0000 mg | ORAL_TABLET | Freq: Every day | ORAL | Status: DC

## 2011-02-03 ENCOUNTER — Encounter: Payer: Medicare Other | Admitting: *Deleted

## 2011-02-07 ENCOUNTER — Ambulatory Visit (INDEPENDENT_AMBULATORY_CARE_PROVIDER_SITE_OTHER): Payer: Medicare Other | Admitting: *Deleted

## 2011-02-07 DIAGNOSIS — I4891 Unspecified atrial fibrillation: Secondary | ICD-10-CM

## 2011-02-08 ENCOUNTER — Encounter: Payer: Medicare Other | Admitting: *Deleted

## 2011-02-22 ENCOUNTER — Telehealth: Payer: Self-pay | Admitting: Internal Medicine

## 2011-02-22 NOTE — Telephone Encounter (Signed)
Cagni with [facility] wanting to order a lymphemia pump but first wanted to check with pt MD to see if pt had CHF. Please return call to discuss further.

## 2011-02-23 ENCOUNTER — Telehealth: Payer: Self-pay | Admitting: Internal Medicine

## 2011-02-23 NOTE — Telephone Encounter (Signed)
Pt will be seeing a vascular provider and pt wants the things on her legs that pump up and daughter in law wants to talk to you first

## 2011-02-23 NOTE — Telephone Encounter (Signed)
Daughter-in-law was wanting to know if she has COPD  I told her I did not see this on her dx sheet  However we treat her for heart issues and this is a lung problem Patient has a friend that lives across the hal from her that wants her to go see Dr C with [facility]  They  are in the process of doing

## 2011-02-26 NOTE — Telephone Encounter (Signed)
Her edema is predominanly due to venous insufficiency.  She has valvular heart disease but very little CHF.

## 2011-02-28 NOTE — Telephone Encounter (Signed)
lmom with the above information

## 2011-03-07 ENCOUNTER — Encounter: Payer: Medicare Other | Admitting: *Deleted

## 2011-03-09 ENCOUNTER — Ambulatory Visit: Payer: Medicare Other | Admitting: Internal Medicine

## 2011-03-09 DIAGNOSIS — Z0289 Encounter for other administrative examinations: Secondary | ICD-10-CM

## 2011-03-10 ENCOUNTER — Ambulatory Visit (INDEPENDENT_AMBULATORY_CARE_PROVIDER_SITE_OTHER): Payer: Medicare Other | Admitting: *Deleted

## 2011-03-10 ENCOUNTER — Telehealth: Payer: Self-pay | Admitting: Internal Medicine

## 2011-03-10 DIAGNOSIS — Z7901 Long term (current) use of anticoagulants: Secondary | ICD-10-CM

## 2011-03-10 DIAGNOSIS — I4891 Unspecified atrial fibrillation: Secondary | ICD-10-CM

## 2011-03-10 NOTE — Telephone Encounter (Signed)
ROi received, all Cardiac records faxed to [facility] @ [phone]  03/10/11/km

## 2011-04-11 ENCOUNTER — Telehealth: Payer: Self-pay | Admitting: Internal Medicine

## 2011-04-11 NOTE — Telephone Encounter (Signed)
Received 5 pages from [facility]. Forwarded to Dr. Felicity Coyer for review. 04/11/11-ar

## 2011-04-21 ENCOUNTER — Encounter: Payer: Medicare Other | Admitting: *Deleted

## 2011-04-27 ENCOUNTER — Ambulatory Visit (INDEPENDENT_AMBULATORY_CARE_PROVIDER_SITE_OTHER): Payer: Medicare Other | Admitting: *Deleted

## 2011-04-27 DIAGNOSIS — Z7901 Long term (current) use of anticoagulants: Secondary | ICD-10-CM

## 2011-04-27 DIAGNOSIS — I4891 Unspecified atrial fibrillation: Secondary | ICD-10-CM

## 2011-05-05 ENCOUNTER — Encounter: Payer: Self-pay | Admitting: Internal Medicine

## 2011-05-11 ENCOUNTER — Other Ambulatory Visit: Payer: Self-pay | Admitting: *Deleted

## 2011-05-11 MED ORDER — POTASSIUM CHLORIDE ER 10 MEQ PO TBCR: 10.0000 meq | EXTENDED_RELEASE_TABLET | Freq: Every day | ORAL | Status: DC

## 2011-05-11 MED ORDER — LISINOPRIL 20 MG PO TABS: 20.0000 mg | ORAL_TABLET | Freq: Two times a day (BID) | ORAL | Status: DC

## 2011-05-11 MED ORDER — CITALOPRAM HYDROBROMIDE 20 MG PO TABS: 20.0000 mg | ORAL_TABLET | Freq: Every day | ORAL | Status: DC

## 2011-05-11 MED ORDER — ONDANSETRON HCL 4 MG PO TABS: 4.0000 mg | ORAL_TABLET | Freq: Three times a day (TID) | ORAL | Status: DC | PRN

## 2011-05-13 ENCOUNTER — Telehealth: Payer: Self-pay | Admitting: *Deleted

## 2011-05-13 MED ORDER — PANTOPRAZOLE SODIUM 40 MG PO TBEC: 40.0000 mg | DELAYED_RELEASE_TABLET | Freq: Every day | ORAL | Status: DC

## 2011-05-13 MED ORDER — FUROSEMIDE 40 MG PO TABS: 40.0000 mg | ORAL_TABLET | Freq: Every day | ORAL | Status: DC

## 2011-05-13 MED ORDER — LEVOTHYROXINE SODIUM 25 MCG PO TABS: 25.0000 ug | ORAL_TABLET | Freq: Every day | ORAL | Status: DC

## 2011-05-13 NOTE — Telephone Encounter (Signed)
Sent renewal to [facility],,,05/13/11@4 :43pm/LMB

## 2011-05-14 ENCOUNTER — Ambulatory Visit (INDEPENDENT_AMBULATORY_CARE_PROVIDER_SITE_OTHER): Payer: Medicare Other

## 2011-05-14 DIAGNOSIS — I4891 Unspecified atrial fibrillation: Secondary | ICD-10-CM

## 2011-05-14 DIAGNOSIS — I10 Essential (primary) hypertension: Secondary | ICD-10-CM

## 2011-05-16 ENCOUNTER — Ambulatory Visit (INDEPENDENT_AMBULATORY_CARE_PROVIDER_SITE_OTHER): Payer: Medicare Other | Admitting: *Deleted

## 2011-05-16 ENCOUNTER — Ambulatory Visit (INDEPENDENT_AMBULATORY_CARE_PROVIDER_SITE_OTHER): Payer: Medicare Other | Admitting: Nurse Practitioner

## 2011-05-16 ENCOUNTER — Telehealth: Payer: Self-pay | Admitting: Internal Medicine

## 2011-05-16 ENCOUNTER — Encounter: Payer: Self-pay | Admitting: Nurse Practitioner

## 2011-05-16 ENCOUNTER — Other Ambulatory Visit: Payer: Self-pay

## 2011-05-16 VITALS — BP 122/72 | HR 84 | Ht 64.0 in | Wt 155.0 lb

## 2011-05-16 DIAGNOSIS — R5383 Other fatigue: Secondary | ICD-10-CM

## 2011-05-16 DIAGNOSIS — R5381 Other malaise: Secondary | ICD-10-CM

## 2011-05-16 DIAGNOSIS — I10 Essential (primary) hypertension: Secondary | ICD-10-CM

## 2011-05-16 DIAGNOSIS — I4891 Unspecified atrial fibrillation: Secondary | ICD-10-CM

## 2011-05-16 DIAGNOSIS — Z7901 Long term (current) use of anticoagulants: Secondary | ICD-10-CM

## 2011-05-16 DIAGNOSIS — G459 Transient cerebral ischemic attack, unspecified: Secondary | ICD-10-CM | POA: Insufficient documentation

## 2011-05-16 DIAGNOSIS — R609 Edema, unspecified: Secondary | ICD-10-CM

## 2011-05-16 DIAGNOSIS — D649 Anemia, unspecified: Secondary | ICD-10-CM

## 2011-05-16 DIAGNOSIS — D539 Nutritional anemia, unspecified: Secondary | ICD-10-CM | POA: Insufficient documentation

## 2011-05-16 LAB — BASIC METABOLIC PANEL
BUN: 22 mg/dL (ref 6–23)
CO2: 25 mEq/L (ref 19–32)
Calcium: 8.6 mg/dL (ref 8.4–10.5)
Chloride: 104 mEq/L (ref 96–112)
Creatinine, Ser: 1.1 mg/dL (ref 0.4–1.2)
GFR: 49.9 mL/min — ABNORMAL LOW (ref >60.00)
Glucose, Bld: 94 mg/dL (ref 70–99)
Potassium: 4.2 mEq/L (ref 3.5–5.1)
Sodium: 141 mEq/L (ref 135–145)

## 2011-05-16 LAB — CBC WITH DIFFERENTIAL/PLATELET
Basophils Absolute: 0 10*3/uL (ref 0.0–0.1)
Basophils Relative: 0.7 % (ref 0.0–3.0)
Eosinophils Absolute: 0.1 10*3/uL (ref 0.0–0.7)
Eosinophils Relative: 1.8 % (ref 0.0–5.0)
HCT: 31.8 % — ABNORMAL LOW (ref 36.0–46.0)
Hemoglobin: 10.8 g/dL — ABNORMAL LOW (ref 12.0–15.0)
Lymphocytes Relative: 26.5 % (ref 12.0–46.0)
Lymphs Abs: 1.6 10*3/uL (ref 0.7–4.0)
MCHC: 33.9 g/dL (ref 30.0–36.0)
MCV: 107.4 fl — ABNORMAL HIGH (ref 78.0–100.0)
Monocytes Absolute: 0.9 10*3/uL (ref 0.1–1.0)
Monocytes Relative: 13.9 % — ABNORMAL HIGH (ref 3.0–12.0)
Neutro Abs: 3.5 10*3/uL (ref 1.4–7.7)
Neutrophils Relative %: 57.1 % (ref 43.0–77.0)
Platelets: 124 10*3/uL — ABNORMAL LOW (ref 150.0–400.0)
RBC: 2.96 Mil/uL — ABNORMAL LOW (ref 3.87–5.11)
RDW: 17.3 % — ABNORMAL HIGH (ref 11.5–14.6)
WBC: 6.1 10*3/uL (ref 4.5–10.5)

## 2011-05-16 LAB — POCT INR: INR: 2.1

## 2011-05-16 LAB — TSH: TSH: 1.02 u[IU]/mL (ref 0.35–5.50)

## 2011-05-16 MED ORDER — DABIGATRAN ETEXILATE MESYLATE 75 MG PO CAPS: 75.0000 mg | ORAL_CAPSULE | Freq: Two times a day (BID) | ORAL | Status: DC

## 2011-05-16 MED ORDER — METOPROLOL TARTRATE 100 MG PO TABS: 100.0000 mg | ORAL_TABLET | Freq: Every day | ORAL | Status: DC

## 2011-05-16 NOTE — Progress Notes (Signed)
Nicole Roberts Date of Birth: November 24, 1921 Medical Record #[id number]  History of Present Illness: Nicole Roberts is seen today for a work in visit. She is seen for Nicole Roberts. She is 78 years of age. Has atrial fib and HTN. She has a pacemaker in place. Other problems are as listed. She remains on chronic anticoagulation with coumadin. She is followed in the coumadin clinic here.  She comes in today at the request of Nicole Roberts. She was seen by him this past weekend for complaints of weakness. There was concern for atrial fib and an elevated blood pressure. Records show a heart rate of 100 and a blood pressure of 168/90 with recheck being 157/104. Labs showed her to have a hemoglobin of 10.7 with an MCV of 106. Metoprolol was added. She was asked to come here for further evaluation.   She comes in today. She tells me that the reason she went to the Urgent Care was because she woke up and could not talk. She thought her speech was garbled. Her neck was hurting. Apparently she had slept for a really long time which was unusual for her. Her last coumadin check was subtherapeutic at 1.7. She was told to take extra but when she got home, she thought she may have just missed a dose, so she did not take any extra. She does not think she has missed any more. She remains fatigued. She has lost a considerable amount of weight since August. She is down from 172 to 155 today. Blood pressure is better and her heart rate is better with the additional Metoprolol. She had had more salt intake. No chest pain. No other neurologic symptoms. Now just tired. No other labs were checked at the Urgent Care.   Current Outpatient Prescriptions on File Prior to Visit  Medication Sig Dispense Refill  . acetaminophen (TYLENOL) 500 MG tablet Take 500 mg by mouth every 6 (six) hours as needed.        [name] atorvastatin (LIPITOR) 10 MG tablet Take 1 tablet (10 mg total) by mouth daily.  90 tablet  4  . [employer] (BETA CAROTENE) CRYS  by Does not apply route daily.        . Cholecalciferol (VITAMIN D3) 1000 UNITS CAPS Take by mouth daily.        . citalopram (CELEXA) 20 MG tablet Take 1 tablet (20 mg total) by mouth daily.  90 tablet  1  . Coenzyme Q10 (CO Q10) 100 MG TABS Take by mouth daily.        . cyanocobalamin (CYANOCOBALAMIN) 500 MCG tablet Take 1 tablet (500 mcg total) by mouth daily.  90 tablet  3  . fentaNYL (DURAGESIC - DOSED MCG/HR) 75 MCG/HR Place 1 patch onto the skin every 3 (three) days.        . ferrous sulfate 325 (65 FE) MG tablet Take 325 mg by mouth daily with breakfast.        . folic acid (FOLVITE) 800 MCG tablet Take 400 mcg by mouth daily.        . furosemide (LASIX) 40 MG tablet Take 1 tablet (40 mg total) by mouth daily.  90 tablet  1  . Lecithin 400 MG CAPS Take by mouth daily.        [name] leflunomide (ARAVA) 10 MG tablet Take 10 mg by mouth daily.        [name] levothyroxine (SYNTHROID, LEVOTHROID) 25 MCG tablet Take 1 tablet (25 mcg total) by mouth daily.  90 tablet  1  . lisinopril (PRINIVIL,ZESTRIL) 20 MG tablet Take 1 tablet (20 mg total) by mouth 2 (two) times daily.  180 tablet  1  . Omega-3 Fatty Acids (FISH OIL) 1000 MG CPDR Take by mouth daily.        . ondansetron (ZOFRAN) 4 MG tablet Take 1 tablet (4 mg total) by mouth every 8 (eight) hours as needed for nausea.  90 tablet  0  . pantoprazole (PROTONIX) 40 MG tablet Take 1 tablet (40 mg total) by mouth daily.  90 tablet  1  . potassium chloride (K-DUR) 10 MEQ tablet Take 1 tablet (10 mEq total) by mouth daily.  90 tablet  1  . predniSONE (DELTASONE) 10 MG tablet Take 5 mg by mouth daily.       . sucralfate (CARAFATE) 1 GM/10ML suspension Take 1 g by mouth as directed.        . zinc sulfate 220 MG capsule Take 220 mg by mouth daily.        [name] DISCONTD: metoprolol (LOPRESSOR) 100 MG tablet Take 1 tablet (100 mg total) by mouth daily.  30 tablet  0    Allergies  Allergen Reactions  . Sulfa Antibiotics   . Tramadol Hcl     Past Medical  History  Diagnosis Date  . HEARING LOSS   . MITRAL VALVE INSUFF&AORTIC VALVE INSUFF     moderate MR  . PULMONARY HYPERTENSION   . Atrial fibrillation     permanent  . SICK SINUS SYNDROME     PPM 2005 for syncope  . Edema 03/19/2009    due to venous insufficiency  . URINARY INCONTINENCE   . Rheumatoid arthritis   . BREAST CANCER 12/2006    ductal ca s/p L lumpectomy  . Diastolic dysfunction   . HYPERTENSION   . HYPERLIPIDEMIA   . GERD   . HYPOTHYROIDISM   . Hyperparathyroidism   . Venous insufficiency   . Spinal stenosis   . Anemia   . History of colonoscopy     Past Surgical History  Procedure Date  . Pacemaker placement 2005    SSS and syncope  . Cholecystectomy 04/06/99  . Abdominal hysterectomy 1970    Partial  . Left hip arthroscopy 1995  . Right hip arthroscopy 01/2001  . Carpal tunnel release 1998  . Breast surgery 12/2006    Left breast lupectomy- Ductal CA  . Left parathyroidectomy 07/2008    History  Smoking status  . Never Smoker   Smokeless tobacco  . Not on file  Comment: Lives at [facility] since 03/2009- married, lives with spouse. Moved here from [facility] to be near son    History  Alcohol Use No    rare    Family History  Problem Relation Age of Onset  . Ovarian cancer Mother   . Coronary artery disease Sister   . Coronary artery disease Brother   . Diabetes Other   . Hyperlipidemia Other     parent, grandparent  . Hypertension Other     parent, grandparent  . Lung cancer Other     parent    Review of Systems: The review of systems is positive for chronic edema from venous insufficiency.  All other systems were reviewed and are negative.  Physical Exam: BP 122/72  Pulse 84  Ht 5\' 4"  (1.626 m)  Wt 155 lb (70.308 kg)  BMI 26.61 kg/m2 Patient is very pleasant and in no acute distress. She looks younger  than her stated age. Skin is warm and dry. Color is normal.  HEENT is unremarkable. Normocephalic/atraumatic.  PERRL. Sclera are nonicteric. Neck is supple. No masses. No JVD. Lungs are clear. Cardiac exam shows an irregular rhythm. Rate is controlled. Abdomen is soft. Extremities are with significant pedal edema. Gait and ROM are intact. No gross neurologic deficits noted.  LABORATORY DATA: CBC and EKG from Urgent Care reviewed.    Assessment / Plan:

## 2011-05-16 NOTE — Assessment & Plan Note (Signed)
Her blood pressure is better today. Repeat by me is 130/80.

## 2011-05-16 NOTE — Telephone Encounter (Signed)
Pt's dtr n law called saying pt seen at urgent care by dr daub this weekend for a-fib and high BP, had ekg, told to be seen today, I just called and talked to [facility] to fax over EKG,  pls advise pt's dtr n law kristol almanzar [phone] Per kelly have pt see lori gerhardt today, will call dtr n law to schedule

## 2011-05-16 NOTE — Assessment & Plan Note (Signed)
This is chronic.  ?

## 2011-05-16 NOTE — Assessment & Plan Note (Signed)
This spell from Saturday sounds more like a TIA. Her blood pressure is better. Heart rate is more controlled. She does have chronic atrial fib and has been on coumadin for many years. Has had some variation in her readings. The flowsheet is reviewed. I have spoken to Dr. Johney Frame regarding her care. We feel that she may need better anticoagulation. We discussed Xarelto and Pradaxa today. She is agreeable to Pradaxa. We will check her coumadin level and transition her appropriately. Will hold off on the CT of the head for now, but if symptoms persist, she will need additional evaluation. I will see her back in a month. No other changes in her medicines. She is to say on the higher dose of Metoprolol. She is to call 911 for any recurrent stroke symptoms. Patient is agreeable to this plan and will call if any problems develop in the interim.

## 2011-05-16 NOTE — Assessment & Plan Note (Signed)
Hemoglobin was 10.7. MCV was 106. Her stool was heme negative per patient's report. She does have a PCP. She has also had considerable weight loss over the past few months. I have asked her to follow up with her PCP soon for further evaluation.

## 2011-05-16 NOTE — Patient Instructions (Signed)
Dr. Johney Frame and I think you may have had a TIA this past weekend.   We will check some other labs today.  I want you to see Dr. Felicity Coyer for evaluation of your weight loss and anemia.  We will switch you over to Pradaxa to better thin your blood.   I will see you in a month to see how you are doing.   Call the [facility] office at [phone] if you have any questions, problems or concerns.

## 2011-05-18 ENCOUNTER — Encounter: Payer: Medicare Other | Admitting: *Deleted

## 2011-05-30 ENCOUNTER — Ambulatory Visit (INDEPENDENT_AMBULATORY_CARE_PROVIDER_SITE_OTHER): Payer: Medicare Other | Admitting: Internal Medicine

## 2011-05-30 ENCOUNTER — Encounter: Payer: Self-pay | Admitting: Internal Medicine

## 2011-05-30 ENCOUNTER — Other Ambulatory Visit (INDEPENDENT_AMBULATORY_CARE_PROVIDER_SITE_OTHER): Payer: Medicare Other

## 2011-05-30 VITALS — BP 122/70 | HR 90 | Temp 98.8°F | Wt 154.8 lb

## 2011-05-30 DIAGNOSIS — S81801A Unspecified open wound, right lower leg, initial encounter: Secondary | ICD-10-CM

## 2011-05-30 DIAGNOSIS — D649 Anemia, unspecified: Secondary | ICD-10-CM

## 2011-05-30 DIAGNOSIS — S91009A Unspecified open wound, unspecified ankle, initial encounter: Secondary | ICD-10-CM

## 2011-05-30 DIAGNOSIS — Z853 Personal history of malignant neoplasm of breast: Secondary | ICD-10-CM

## 2011-05-30 DIAGNOSIS — R634 Abnormal weight loss: Secondary | ICD-10-CM

## 2011-05-30 LAB — IBC PANEL: Saturation Ratios: 38.6 % (ref 20.0–50.0)

## 2011-05-30 NOTE — Progress Notes (Signed)
Subjective:    Patient ID: Nicole Roberts, female    DOB: 24-Nov-1921, 78 y.o.   MRN: [id number]  HPI  multiple concerns TIA symptoms 05/2011> by cardiology for same 1/14> Coumadin changed to Pradaxa Weight loss, unintentional - associated with poor appetite but no nausea and vomiting or bowel change, +night sweats Anemia, no symptoms bleeding - chronic fatigue - takes iron for same  also reviewed chronic med issues: arthritis - clarified as RA after meeting with rheum 10/2009 - on fentanyl patch for pain control - primary pain symptoms are in shoulders and prox thighs but +pain "all over everywhere" -- on steroids since 07/2010 - no weakness, no leg or back pain (also hx spinal stenosis noted) - no new joint swelling   HTN -reports compliance with ongoing medical treatment and no changes in medication dose or frequency. denies adverse side effects related to current therapy.   dyslipidemia - on fish oil but no statin due to persistingly inc CK 11/2009(lipitor stopped 10/2009 due to inc CK in hosp) - follows with cards for same  edema - chronic problem in both legs but reports worse since late 2010- No associated shortness of breath or abdomen swelling - usually wears compression hose which seem effective per pt - not improved with weight loss and diuresis  hypothyroid - reports compliance with ongoing medical treatment and no changes in medication dose or frequency. denies adverse side effects related to current therapy. no weight changes or bowel or skin changes   Past Medical History  Diagnosis Date  . HEARING LOSS   . MITRAL VALVE INSUFF&AORTIC VALVE INSUFF     moderate MR  . PULMONARY HYPERTENSION   . Atrial fibrillation     permanent  . SICK SINUS SYNDROME     PPM 2005 for syncope  . Edema 03/19/2009    due to venous insufficiency  . URINARY INCONTINENCE   . Rheumatoid arthritis   . BREAST CANCER 12/2006    ductal ca s/p L lumpectomy  . Diastolic dysfunction   . HYPERTENSION    . HYPERLIPIDEMIA   . GERD   . HYPOTHYROIDISM   . Hyperparathyroidism   . Venous insufficiency   . Spinal stenosis   . Anemia   . History of colonoscopy   . TIA (transient ischemic attack) 05/2011   Family History  Problem Relation Age of Onset  . Ovarian cancer Mother   . Coronary artery disease Sister   . Coronary artery disease Brother   . Diabetes Other   . Hyperlipidemia Other     parent, grandparent  . Hypertension Other     parent, grandparent  . Lung cancer Other     parent   History  Substance Use Topics  . Smoking status: Never Smoker   . Smokeless tobacco: Not on file   Comment: Lives at [facility] since 03/2009- married, lives with spouse. Moved here from [facility] to be near son  . Alcohol Use: No     rare     Review of Systems  Constitutional: Positive for fatigue. Negative for fever.  Respiratory: Negative for cough.   Cardiovascular: Negative for chest pain.  Musculoskeletal: Negative for joint swelling.  Neurological: Negative for headaches.  other systems reviewed and negative except as listed above in HPI.     Objective:   Physical Exam BP 122/70  Pulse 90  Temp(Src) 98.8 F (37.1 C) (Oral)  Wt 154 lb 12.8 oz (70.217 kg)  SpO2 96%  Wt Readings from  Last 3 Encounters:  05/30/11 154 lb 12.8 oz (70.217 kg)  05/16/11 155 lb (70.308 kg)  12/15/10 172 lb (78.019 kg)   Constitutional: She appears well-developed and well-nourished. No distress. Dtr in law at side Neck: Normal range of motion. Neck supple. No JVD present. No thyromegaly present.  Cardiovascular: Normal rate, regular rhythm and normal heart sounds.  No murmur heard. chronic 2+ BLE edema. Pulmonary/Chest: Effort normal and breath sounds normal. No respiratory distress. She has no wheezes.  Abdominal: Soft. Bowel sounds are normal. She exhibits no distension. There is no tenderness. no masses Musculoskeletal: Chronic hand/wrist arthritic changes consistent with RA -  diffuse mild lymphedema of UE<LE Neurological: She is alert and oriented to person, place, and time. No cranial nerve deficit. Coordination normal.  Skin: BLE wrapped mid calf>foot - dressing C/D/I, not otherwise examined today Psychiatric: She has a normal mood and affect. Her behavior is normal. Judgment and thought content normal.    Lab Results  Component Value Date   WBC 6.1 05/16/2011   HGB 10.8* 05/16/2011   HCT 31.8* 05/16/2011   PLT 124.0* 05/16/2011   CHOL 202* 12/28/2009   TRIG 120.0 12/28/2009   HDL 54.10 12/28/2009   LDLDIRECT 131.4 12/28/2009   ALT 26 05/12/2010   AST 29 05/12/2010   NA 141 05/16/2011   K 4.2 05/16/2011   CL 104 05/16/2011   CREATININE 1.1 05/16/2011   BUN 22 05/16/2011   CO2 25 05/16/2011   TSH 1.02 05/16/2011   INR 2.1 05/16/2011   No results found for this basename: IRON, TIBC, FERRITIN       Assessment & Plan:  See problem list. Medications and labs reviewed today.  Weight loss, unintentional - associated with chronic mild anemia, anorexia, hx breast cancer and night sweats; also chronic pred and immunosuppression for RA tx - review labs to exclude metabolic issue (thyroid, DM) - consider CT imaging if no other medical explaination found - see anemia and other discussion - follow up 2-4 weeks to continue review  (addendum: CT tests ordered)  Leg wound R>L LE - induced by peroneal nerve bx 2 weeks ago by podiatry (to eval chroinc neuropathy symptoms)- complicated by chronic lymphedema>>weeping despite wound care ongoing> refer to wound care clinic for asst - send for ROI from podiatry re: nerve bx results  TIA event 2 weeks ago - Urg care note and cards follow up on same reviewed - changed from coumadin to Pradaxa - no residual neuro deficits -

## 2011-05-30 NOTE — Patient Instructions (Signed)
It was good to see you today. Test(s) ordered today. Your results will be called to you after review (48-72hours after test completion). If any changes need to be made, you will be notified at that time. we'll make referral to wound care center. Our office will contact you regarding appointment(s) once made. Medications reviewed, no changes at this time. Please schedule followup in 2-4 weeks to continue review, call sooner if problems.

## 2011-05-31 ENCOUNTER — Encounter: Payer: Self-pay | Admitting: Internal Medicine

## 2011-05-31 NOTE — Assessment & Plan Note (Signed)
Recent CBC reviewed- chronic stable anemia without significant change check iron stores now No hx bleeding but on chronic anticoag (high risk) Prior colo 2009 reported normal (in [place]) - also heme neg last week per dtr-in-law Consider need for CT scan or heme eval for same

## 2011-06-01 ENCOUNTER — Telehealth: Payer: Self-pay | Admitting: *Deleted

## 2011-06-01 NOTE — Telephone Encounter (Signed)
Ok noted. thanks

## 2011-06-01 NOTE — Telephone Encounter (Signed)
Received walk-in sheet stating please do not call pt with CT results. She is going out of town. Please call daughter Nara Paternoster @ [phone] with results.Marland KitchenMarland Kitchen1/30/13@3 :27pm/LMB

## 2011-06-03 ENCOUNTER — Ambulatory Visit (INDEPENDENT_AMBULATORY_CARE_PROVIDER_SITE_OTHER)
Admission: RE | Admit: 2011-06-03 | Discharge: 2011-06-03 | Disposition: A | Discharge status: Home / Self Care | Payer: Medicare Other | Source: Ambulatory Visit | Attending: Internal Medicine | Admitting: Internal Medicine

## 2011-06-03 DIAGNOSIS — D649 Anemia, unspecified: Secondary | ICD-10-CM

## 2011-06-03 DIAGNOSIS — R634 Abnormal weight loss: Secondary | ICD-10-CM

## 2011-06-03 DIAGNOSIS — Z853 Personal history of malignant neoplasm of breast: Secondary | ICD-10-CM

## 2011-06-03 MED ORDER — IOHEXOL 300 MG/ML  SOLN
100.0000 mL | Freq: Once | INTRAMUSCULAR | Status: AC | PRN
  Administered 2011-06-03: 100 mL via INTRAVENOUS

## 2011-06-06 ENCOUNTER — Encounter (HOSPITAL_BASED_OUTPATIENT_CLINIC_OR_DEPARTMENT_OTHER): Payer: Medicare Other | Attending: Internal Medicine

## 2011-06-06 DIAGNOSIS — Z950 Presence of cardiac pacemaker: Secondary | ICD-10-CM | POA: Insufficient documentation

## 2011-06-06 DIAGNOSIS — I10 Essential (primary) hypertension: Secondary | ICD-10-CM | POA: Insufficient documentation

## 2011-06-06 DIAGNOSIS — D649 Anemia, unspecified: Secondary | ICD-10-CM | POA: Insufficient documentation

## 2011-06-06 DIAGNOSIS — C50919 Malignant neoplasm of unspecified site of unspecified female breast: Secondary | ICD-10-CM | POA: Insufficient documentation

## 2011-06-06 DIAGNOSIS — E039 Hypothyroidism, unspecified: Secondary | ICD-10-CM | POA: Insufficient documentation

## 2011-06-06 DIAGNOSIS — M7989 Other specified soft tissue disorders: Secondary | ICD-10-CM | POA: Insufficient documentation

## 2011-06-06 DIAGNOSIS — E785 Hyperlipidemia, unspecified: Secondary | ICD-10-CM | POA: Insufficient documentation

## 2011-06-06 DIAGNOSIS — Z79899 Other long term (current) drug therapy: Secondary | ICD-10-CM | POA: Insufficient documentation

## 2011-06-06 DIAGNOSIS — M129 Arthropathy, unspecified: Secondary | ICD-10-CM | POA: Insufficient documentation

## 2011-06-06 DIAGNOSIS — T8189XA Other complications of procedures, not elsewhere classified, initial encounter: Secondary | ICD-10-CM | POA: Insufficient documentation

## 2011-06-06 DIAGNOSIS — R609 Edema, unspecified: Secondary | ICD-10-CM | POA: Insufficient documentation

## 2011-06-06 DIAGNOSIS — I872 Venous insufficiency (chronic) (peripheral): Secondary | ICD-10-CM | POA: Insufficient documentation

## 2011-06-06 DIAGNOSIS — Z8673 Personal history of transient ischemic attack (TIA), and cerebral infarction without residual deficits: Secondary | ICD-10-CM | POA: Insufficient documentation

## 2011-06-06 DIAGNOSIS — Z90710 Acquired absence of both cervix and uterus: Secondary | ICD-10-CM | POA: Insufficient documentation

## 2011-06-06 DIAGNOSIS — Y849 Medical procedure, unspecified as the cause of abnormal reaction of the patient, or of later complication, without mention of misadventure at the time of the procedure: Secondary | ICD-10-CM | POA: Insufficient documentation

## 2011-06-14 NOTE — Progress Notes (Signed)
[facility]  NAME:  MCCARTNEY, BRUCKS NO.:  [id number]  MEDICAL RECORD NO.:  [id number]      DATE OF BIRTH:  11-Jun-1921  PHYSICIAN:  Joanne Gavel, M.D.         VISIT DATE:  06/14/2011                                  OFFICE VISIT   CHIEF COMPLAINT:  Drainage from both legs.  HISTORY OF PRESENT ILLNESS:  Approximately 3 weeks ago, the patient presented with foot pain to the podiatrist who made a provisional diagnosis of neuropathy and perform punch biopsies on both lower extremities.  Since then, the patient has had continuous weeping and a great deal of swelling of both legs.  PAST MEDICAL HISTORY:  Significant for anemia, arthritis, hypertension, dyslipidemia, hypothyroidism, cancer of the breast, and venous insufficiency.  There is also a recent history of a high blood pressure episode and some confusion, which she was told was a transient ischemic attack.  PAST SURGICAL HISTORY:  Includes cholecystectomy, hysterectomy, right and left hip surgery, breast surgery, left parathyroidectomy and cholecystectomy.  Cigarettes, none.  Alcohol, none.  MEDICATIONS:  Lipitor, carotene, Celexa, B12, fentanyl, ferrous sulfate, folic acid, Lasix, lecithin, Synthroid, lisinopril, Protonix, potassium, prednisone 5 mg daily, Carafate, and zinc sulfide.  ALLERGIES:  Sulfa and tramadol.  REVIEW OF SYSTEMS:  Essentially as above.  PHYSICAL EXAMINATION:  VITAL SIGNS:  Temperature 98.4, pulse 80 and regular, respirations 18, blood pressure 110/70. GENERAL APPEARANCE:  A well developed, well nourished, in no distress. EYES, EARS, NOSE, THROAT:  Essentially normal. Normal symmetrical strength. EXTREMITIES:  Reveals a great deal of swelling from the knee, bilateral down to the toes.  This is pitting.  There were 2 small wounds, 1 on each foreleg, 0.2 x 0.2, which is weeping copiously odored clear fluid. There is no sign of cellulitis, swelling, or  tenderness.  Peripheral pulses are palpable.  ABIs are 1.2 bilaterally.  IMPRESSION:  Nonhealing wounds secondary to venous insufficiency and marked edema.  PLAN OF TREATMENT:  Coban 2 on both wounds to aid in edema reduction. We will see her in 7 days.     Joanne Gavel, M.D.     RA/MEDQ  D:  06/14/2011  T:  06/14/2011  Job:  [id number]

## 2011-06-15 ENCOUNTER — Ambulatory Visit: Payer: Self-pay | Admitting: Nurse Practitioner

## 2011-06-16 ENCOUNTER — Ambulatory Visit: Payer: Medicare Other | Admitting: Nurse Practitioner

## 2011-06-20 ENCOUNTER — Ambulatory Visit (INDEPENDENT_AMBULATORY_CARE_PROVIDER_SITE_OTHER): Payer: Medicare Other | Admitting: Internal Medicine

## 2011-06-20 ENCOUNTER — Encounter: Payer: Self-pay | Admitting: Internal Medicine

## 2011-06-20 VITALS — BP 130/74 | HR 97 | Temp 98.5°F | Wt 143.4 lb

## 2011-06-20 DIAGNOSIS — J9801 Acute bronchospasm: Secondary | ICD-10-CM

## 2011-06-20 DIAGNOSIS — R634 Abnormal weight loss: Secondary | ICD-10-CM

## 2011-06-20 DIAGNOSIS — M069 Rheumatoid arthritis, unspecified: Secondary | ICD-10-CM

## 2011-06-20 MED ORDER — ALBUTEROL 90 MCG/ACT IN AERS: 2.0000 | INHALATION_SPRAY | Freq: Four times a day (QID) | RESPIRATORY_TRACT | Status: DC | PRN

## 2011-06-20 MED ORDER — PREDNISONE (PAK) 10 MG PO TABS: 10.0000 mg | ORAL_TABLET | ORAL | Status: DC

## 2011-06-20 NOTE — Patient Instructions (Addendum)
It was good to see you today. We have reviewed your prior interval records including labs and tests today we'll make referral to  gastroenterology for your weight loss and lack of appetite. Our office will contact you regarding appointment(s) once made. Also followup evaluation with Dr. Dareen Piano regarding possible medication side effect causing weight loss and anorexia -  Prednisone taper x6 days and albuterol inhaler as needed for wheezing - Your prescription(s) have been submitted to your pharmacy. Please take as directed and contact our office if you believe you are having problem(s) with the medication(s). If you develop worsening symptoms or fever, call and we can reconsider antibiotics, but it does not appear necessary to use antibiotics at this time. Continue working with wound care clinic on your legs as ongoing

## 2011-06-20 NOTE — Progress Notes (Signed)
Subjective:    Patient ID: Nicole Roberts, female    DOB: [birth year], 78 y.o.   MRN: [id number]  HPI  Here for follow up -multiple concerns TIA symptoms 05/2011> by cardiology for same 1/14> Coumadin changed to Pradaxa, ? Causing fatigue per patient Weight loss, unintentional - associated with poor appetite but no nausea and vomiting or bowel change, +night sweats Anemia, no symptoms bleeding - chronic fatigue now worse since change to Pradaxa - takes iron for same  also reviewed chronic med issues: arthritis - clarified as RA after meeting with rheumatology 10/2009 - on fentanyl patch for pain control - primary pain symptoms are shoulders and prox thighs but +pain "all over everywhere" -- on steroids since 07/2010 - no weakness, no leg or back pain (also hx spinal stenosis noted) - no new joint swelling   HTN -reports compliance with ongoing medical treatment and no changes in medication dose or frequency. denies adverse side effects related to current therapy.   dyslipidemia - on fish oil but no statin due to persistingly inc CK 11/2009(lipitor stopped 10/2009 due to inc CK in hosp) - follows with cards for same  edema - chronic problem in both legs but reports worse since late 2010- No associated shortness of breath or abdomen swelling - usually wears compression hose which seem effective per pt - not improved with weight loss and diuresis  hypothyroid - reports compliance with ongoing medical treatment and no changes in medication dose or frequency. denies adverse side effects related to current therapy. no weight changes or bowel or skin changes   Past Medical History  Diagnosis Date  . HEARING LOSS   . MITRAL VALVE INSUFF&AORTIC VALVE INSUFF     moderate MR  . PULMONARY HYPERTENSION   . Atrial fibrillation     permanent  . SICK SINUS SYNDROME     PPM 2005 for syncope  . Edema 03/19/2009    due to venous insufficiency  . URINARY INCONTINENCE   . Rheumatoid arthritis dx clarified  2011  . BREAST CANCER 12/2006    ductal ca s/p L lumpectomy  . Diastolic dysfunction   . HYPERTENSION   . HYPERLIPIDEMIA   . GERD   . HYPOTHYROIDISM   . Hyperparathyroidism   . Venous insufficiency     chronic BLE edema  . Spinal stenosis   . Anemia   . History of colonoscopy 2009  . TIA (transient ischemic attack) 05/2011     Review of Systems  Constitutional: Positive for appetite change and fatigue. Negative for fever and activity change.  Respiratory: Positive for cough and wheezing (at night and with cough). Negative for shortness of breath.   Cardiovascular: Negative for chest pain.  Gastrointestinal: Negative for abdominal pain and abdominal distention.  Musculoskeletal: Negative for joint swelling.  Neurological: Negative for headaches.       Objective:   Physical Exam  BP 130/74  Pulse 97  Temp(Src) 98.5 F (36.9 C) (Oral)  Wt 143 lb 6.4 oz (65.046 kg)  SpO2 97% Wt Readings from Last 3 Encounters:  06/20/11 143 lb 6.4 oz (65.046 kg)  05/30/11 154 lb 12.8 oz (70.217 kg)  05/16/11 155 lb (70.308 kg)   Constitutional: She appears well-developed and well-nourished. No distress. Dtr in law at side Neck: Normal range of motion. Neck supple. No JVD present. No thyromegaly present.  Cardiovascular: Normal rate, regular rhythm and normal heart sounds.  No murmur heard. chronic 2+ BLE edema. Pulmonary/Chest: Effort normal and breath sounds normal. No  respiratory distress. She has occasional soft end expiratory wheeze.  Abdomen: Firm, minimally distended but soft. Non-incarcerated umbilical hernia. Positive bowel sounds. Nontender no mass Musculoskeletal: Chronic hand/wrist arthritic changes consistent with RA - diffuse mild lymphedema of UE<LE Skin: BLE wrapped mid calf>foot - dressing C/D/I, not otherwise examined today Psychiatric: She has a normal mood and affect. Her behavior is normal. Judgment and thought content normal.    Lab Results  Component Value Date    WBC 6.1 05/16/2011   HGB 10.8* 05/16/2011   HCT 31.8* 05/16/2011   PLT 124.0* 05/16/2011   CHOL 202* 12/28/2009   TRIG 120.0 12/28/2009   HDL 54.10 12/28/2009   LDLDIRECT 131.4 12/28/2009   ALT 26 05/12/2010   AST 29 05/12/2010   NA 141 05/16/2011   K 4.2 05/16/2011   CL 104 05/16/2011   CREATININE 1.1 05/16/2011   BUN 22 05/16/2011   CO2 25 05/16/2011   TSH 1.02 05/16/2011   INR 2.1 05/16/2011   Lab Results  Component Value Date   IRON 109 05/30/2011      Ct Chest W Contrast  06/03/2011  *RADIOLOGY REPORT*  Clinical Data:  Abdominal and pelvic pain.  Weight loss.  Decreased appetite.  CT CHEST, ABDOMEN AND PELVIS WITH CONTRAST  Technique:  Multidetector CT imaging of the chest, abdomen and pelvis was performed following the standard protocol during bolus administration of intravenous contrast.  Contrast: OMNIPAQUE IOHEXOL 300 MG/ML IV SOLN  Comparison:  None  CT CHEST  Findings:  No enlarged axillary or supraclavicular lymph nodes.  No pericardial or pleural effusion.  No enlarged mediastinal or hilar adenopathy.  No pericardial or pleural effusion.  The patient has a left chest wall pacer device with leads in the right atrial appendage and right ventricle.  There is mild peripheral interstitial reticulation.  There is no airspace consolidation identified.  Air-filled esophagus is identified.  No mass identified.  Review of the visualized bony structures is unremarkable.  IMPRESSION:  1.  No acute cardiopulmonary abnormalities identified. 2.  Peripheral interstitial reticulation.  Query very early interstitial lung disease. 3.  Cardiac enlargement.  CT ABDOMEN AND PELVIS  Findings:  The spleen is normal. The adrenal glands are normal.  No focal liver abnormality.  Prior cholecystectomy.  The liver measures the common bile duct is increased in caliber measuring 1.2 cm.  No stone or mass identified.  Mildly complex cyst contains an internal area of very thin septation.  This is identified within the right  kidney measuring 5 x 4.8 cm, image 67.  There is a cyst within the inferior pole of the left kidney which measures 5 x 4.9 cm, image 58.  No complicating features associated with this cyst.  Stone within the mid pole collecting system of the left kidney measures 0.3 cm, image 55.  No enlarged upper abdominal lymph nodes.  There is no pelvic or inguinal adenopathy.  The patient has bilateral hip arthroplasty devices.  Extensive beam hardening artifact obscures much of the mid and lower pelvis.  The stomach and the small bowel loops have a normal caliber.  The colon appears within normal limits.  The review of the visualized osseous structures is significant for mild multilevel degenerative disc disease.  There are no aggressive bone lesions identified.  IMPRESSION:  1.  No mass or adenopathy identified within the upper abdomen or pelvis. 2.  Increased caliber of the common bile duct status post cholecystectomy. 3.  Minimally complex cyst arises from the inferior  pole of the right kidney containing a small thin internal septation.  Original Report Authenticated By: Rosealee Albee, M.D.   Ct Abdomen Pelvis W Contrast  06/03/2011  *RADIOLOGY REPORT*  Clinical Data:  Abdominal and pelvic pain.  Weight loss.  Decreased appetite.  CT CHEST, ABDOMEN AND PELVIS WITH CONTRAST  Technique:  Multidetector CT imaging of the chest, abdomen and pelvis was performed following the standard protocol during bolus administration of intravenous contrast.  Contrast: OMNIPAQUE IOHEXOL 300 MG/ML IV SOLN  Comparison:  None  CT CHEST  Findings:  No enlarged axillary or supraclavicular lymph nodes.  No pericardial or pleural effusion.  No enlarged mediastinal or hilar adenopathy.  No pericardial or pleural effusion.  The patient has a left chest wall pacer device with leads in the right atrial appendage and right ventricle.  There is mild peripheral interstitial reticulation.  There is no airspace consolidation identified.   Air-filled esophagus is identified.  No mass identified.  Review of the visualized bony structures is unremarkable.  IMPRESSION:  1.  No acute cardiopulmonary abnormalities identified. 2.  Peripheral interstitial reticulation.  Query very early interstitial lung disease. 3.  Cardiac enlargement.  CT ABDOMEN AND PELVIS  Findings:  The spleen is normal. The adrenal glands are normal.  No focal liver abnormality.  Prior cholecystectomy.  The liver measures the common bile duct is increased in caliber measuring 1.2 cm.  No stone or mass identified.  Mildly complex cyst contains an internal area of very thin septation.  This is identified within the right kidney measuring 5 x 4.8 cm, image 67.  There is a cyst within the inferior pole of the left kidney which measures 5 x 4.9 cm, image 58.  No complicating features associated with this cyst.  Stone within the mid pole collecting system of the left kidney measures 0.3 cm, image 55.  No enlarged upper abdominal lymph nodes.  There is no pelvic or inguinal adenopathy.  The patient has bilateral hip arthroplasty devices.  Extensive beam hardening artifact obscures much of the mid and lower pelvis.  The stomach and the small bowel loops have a normal caliber.  The colon appears within normal limits.  The review of the visualized osseous structures is significant for mild multilevel degenerative disc disease.  There are no aggressive bone lesions identified.  IMPRESSION:  1.  No mass or adenopathy identified within the upper abdomen or pelvis. 2.  Increased caliber of the common bile duct status post cholecystectomy. 3.  Minimally complex cyst arises from the inferior pole of the right kidney containing a small thin internal septation.  Original Report Authenticated By: Rosealee Albee, M.D.  Assessment & Plan:  See problem list. Medications and labs reviewed today.  Bronchospasm - suspect inflammatory related to viral URI. O2 sats good and afebrile. No sputum. Will treat  with increased prednisone x6 days (taper on top of chronic dose) - also albuterol MDI to use when necessary dyspnea symptoms. No antibiotics indicated. Patient to call if symptoms worse or unimproved  Weight loss, unintentional - ?possibly associated with leflunomide side effects for RA -  also associated with chronic mild anemia, anorexia, hx breast cancer and night sweats; also chronic pred and immunosuppression for RA tx - reviewed recent labs - no  metabolic issue (thyroid, DM) - 06/2011 CT C/A/P reviewed: no clinically sig abnormalities found - refer to GI for other evaluation as needed  Leg wound R>L LE - induced by peroneal nerve bx 05/25/11 by  podiatry (to eval chroinc neuropathy symptoms)- complicated by chronic lymphedema>>weeping despite wound care ongoing> defer to wound care clinic for mgmt as ongoing -   TIA event 05/2011 - cards follow up on same reviewed - changed from coumadin to Pradaxa 05/2011 - no residual neuro deficits - reassurance provided regarding medications

## 2011-06-21 ENCOUNTER — Ambulatory Visit: Payer: Medicare Other | Admitting: Nurse Practitioner

## 2011-06-21 NOTE — Assessment & Plan Note (Signed)
Question weight loss as side effects of Arava - will ask patient to review with rheumatology regarding same Currently pain symptoms stable on prednisone, immunotherapy and fentanyl patch -no changes recommended by me today

## 2011-06-28 ENCOUNTER — Encounter: Payer: Self-pay | Admitting: *Deleted

## 2011-07-04 ENCOUNTER — Encounter (HOSPITAL_BASED_OUTPATIENT_CLINIC_OR_DEPARTMENT_OTHER): Payer: Medicare Other

## 2011-07-07 ENCOUNTER — Encounter (HOSPITAL_BASED_OUTPATIENT_CLINIC_OR_DEPARTMENT_OTHER): Payer: Medicare Other | Attending: Internal Medicine

## 2011-07-07 DIAGNOSIS — L97509 Non-pressure chronic ulcer of other part of unspecified foot with unspecified severity: Secondary | ICD-10-CM | POA: Insufficient documentation

## 2011-07-07 DIAGNOSIS — E039 Hypothyroidism, unspecified: Secondary | ICD-10-CM | POA: Insufficient documentation

## 2011-07-07 DIAGNOSIS — Z79899 Other long term (current) drug therapy: Secondary | ICD-10-CM | POA: Insufficient documentation

## 2011-07-07 DIAGNOSIS — I872 Venous insufficiency (chronic) (peripheral): Secondary | ICD-10-CM | POA: Insufficient documentation

## 2011-07-07 DIAGNOSIS — I10 Essential (primary) hypertension: Secondary | ICD-10-CM | POA: Insufficient documentation

## 2011-08-04 ENCOUNTER — Encounter (HOSPITAL_BASED_OUTPATIENT_CLINIC_OR_DEPARTMENT_OTHER): Payer: Medicare Other | Attending: Internal Medicine

## 2011-08-04 DIAGNOSIS — L97809 Non-pressure chronic ulcer of other part of unspecified lower leg with unspecified severity: Secondary | ICD-10-CM | POA: Insufficient documentation

## 2011-08-04 DIAGNOSIS — E785 Hyperlipidemia, unspecified: Secondary | ICD-10-CM | POA: Insufficient documentation

## 2011-08-04 DIAGNOSIS — Z79899 Other long term (current) drug therapy: Secondary | ICD-10-CM | POA: Insufficient documentation

## 2011-08-04 DIAGNOSIS — I10 Essential (primary) hypertension: Secondary | ICD-10-CM | POA: Insufficient documentation

## 2011-08-04 DIAGNOSIS — I872 Venous insufficiency (chronic) (peripheral): Secondary | ICD-10-CM | POA: Insufficient documentation

## 2011-09-01 ENCOUNTER — Encounter (HOSPITAL_BASED_OUTPATIENT_CLINIC_OR_DEPARTMENT_OTHER): Payer: Medicare Other

## 2011-09-07 ENCOUNTER — Encounter: Payer: Self-pay | Admitting: Internal Medicine

## 2011-09-07 ENCOUNTER — Ambulatory Visit: Payer: Medicare Other | Admitting: Internal Medicine

## 2011-09-07 ENCOUNTER — Other Ambulatory Visit (INDEPENDENT_AMBULATORY_CARE_PROVIDER_SITE_OTHER): Payer: Medicare Other

## 2011-09-07 ENCOUNTER — Ambulatory Visit (INDEPENDENT_AMBULATORY_CARE_PROVIDER_SITE_OTHER): Payer: Medicare Other | Admitting: Internal Medicine

## 2011-09-07 VITALS — BP 130/84 | HR 83 | Temp 98.1°F | Ht 62.0 in | Wt 148.0 lb

## 2011-09-07 DIAGNOSIS — R609 Edema, unspecified: Secondary | ICD-10-CM

## 2011-09-07 DIAGNOSIS — G459 Transient cerebral ischemic attack, unspecified: Secondary | ICD-10-CM

## 2011-09-07 LAB — BASIC METABOLIC PANEL
CO2: 30 mEq/L (ref 19–32)
Calcium: 9.3 mg/dL (ref 8.4–10.5)
Potassium: 4.1 mEq/L (ref 3.5–5.1)
Sodium: 141 mEq/L (ref 135–145)

## 2011-09-07 NOTE — Progress Notes (Signed)
Subjective:    Patient ID: Nicole Roberts, female    DOB: 08-24-1921, 78 y.o.   MRN: [id number]  HPI  Here for increase in leg swelling over past 3 weeks Improved with increase n Lasix in past 2 weeks occasional orthostatic dizzy symptoms -    also reviewed chronic med issues: arthritis - clarified as RA after meeting with rheumatology 10/2009 - on fentanyl patch for pain control - primary pain symptoms are shoulders and prox thighs but +pain "all over everywhere" -- on steroids since 07/2010 - no weakness, no leg or back pain (also hx spinal stenosis noted) - no new joint swelling   HTN -reports compliance with ongoing medical treatment and no changes in medication dose or frequency. denies adverse side effects related to current therapy.   dyslipidemia - on fish oil but no statin due to persistingly inc CK 11/2009(lipitor stopped 10/2009 due to inc CK in hosp) - follows with cards for same  edema - chronic problem in both legs but reports worse since late 2010- No associated shortness of breath or abdomen swelling - usually wears compression hose which seem effective per pt - not improved with weight loss and diuresis  hypothyroid - reports compliance with ongoing medical treatment and no changes in medication dose or frequency. denies adverse side effects related to current therapy. no weight changes or bowel or skin changes   Past Medical History  Diagnosis Date  . HEARING LOSS   . MITRAL VALVE INSUFF&AORTIC VALVE INSUFF     moderate MR  . PULMONARY HYPERTENSION   . Atrial fibrillation     permanent  . SICK SINUS SYNDROME     PPM 2005 for syncope  . Edema 03/19/2009    due to venous insufficiency  . URINARY INCONTINENCE   . Rheumatoid arthritis dx clarified 2011  . BREAST CANCER 12/2006    ductal ca s/p L lumpectomy  . Diastolic dysfunction   . HYPERTENSION   . HYPERLIPIDEMIA   . GERD   . HYPOTHYROIDISM   . Hyperparathyroidism   . Venous insufficiency     chronic BLE  edema  . Spinal stenosis   . Anemia   . History of colonoscopy 2009  . TIA (transient ischemic attack) 05/2011     Review of Systems  Constitutional: Positive for fatigue. Negative for fever, activity change and appetite change.  Respiratory: Negative for cough and shortness of breath.   Cardiovascular: Positive for leg swelling. Negative for chest pain.  Gastrointestinal: Negative for abdominal pain and abdominal distention.  Musculoskeletal: Negative for joint swelling.  Neurological: Negative for headaches.       Objective:   Physical Exam  BP 130/84  Pulse 83  Temp(Src) 98.1 F (36.7 C) (Oral)  Ht 5\' 2"  (1.575 m)  Wt 148 lb (67.132 kg)  BMI 27.07 kg/m2  SpO2 95% Wt Readings from Last 3 Encounters:  09/07/11 148 lb (67.132 kg)  06/20/11 143 lb 6.4 oz (65.046 kg)  05/30/11 154 lb 12.8 oz (70.217 kg)   Constitutional: She appears well-developed and well-nourished. No distress. son at side Neck: Normal range of motion. Neck supple. No JVD present. No thyromegaly present.  Cardiovascular: Normal rate, regular rhythm and normal heart sounds.  No murmur heard. chronic 2+ BLE edema. Pulmonary/Chest: Effort normal and breath sounds normal. No respiratory distress. She has no wheeze.  Abdomen: Firm, minimally distended but soft. Non-incarcerated umbilical hernia. Positive bowel sounds. Nontender no mass Musculoskeletal: Chronic hand/wrist arthritic changes consistent with RA - diffuse  mod lymphedema of UE<LE Psychiatric: She has a normal mood and affect. Her behavior is normal. Judgment and thought content normal.    Lab Results  Component Value Date   WBC 6.1 05/16/2011   HGB 10.8* 05/16/2011   HCT 31.8* 05/16/2011   PLT 124.0* 05/16/2011   CHOL 202* 12/28/2009   TRIG 120.0 12/28/2009   HDL 54.10 12/28/2009   LDLDIRECT 131.4 12/28/2009   ALT 26 05/12/2010   AST 29 05/12/2010   NA 141 05/16/2011   K 4.2 05/16/2011   CL 104 05/16/2011   CREATININE 1.1 05/16/2011   BUN 22 05/16/2011    CO2 25 05/16/2011   TSH 1.02 05/16/2011   INR 2.1 05/16/2011   Lab Results  Component Value Date   IRON 109 05/30/2011       Assessment & Plan:  chronic lymphedema>>weeping despite wound care ongoing> increased lasix, check labs

## 2011-09-07 NOTE — Patient Instructions (Addendum)
It was good to see you today. Test(s) ordered today. Your results will be called to you after review (48-72hours after test completion). If any changes need to be made, you will be notified at that time. Medications reviewed, no changes at this time. Please schedule followup in 3-4 months, call sooner if problems.  

## 2011-09-07 NOTE — Assessment & Plan Note (Signed)
TIA event 05/2011 - cards follow up on same reviewed -  changed from coumadin to Pradaxa 05/2011 - no residual neuro deficits - reassurance provided regarding medications

## 2011-09-07 NOTE — Assessment & Plan Note (Signed)
Chronic lymphedema, slight exacerbation Improving with self-directed increase Lasix Check lytes and advised compliance with compression hose

## 2011-09-14 ENCOUNTER — Ambulatory Visit: Payer: Medicare Other | Admitting: Nurse Practitioner

## 2011-09-21 ENCOUNTER — Ambulatory Visit (INDEPENDENT_AMBULATORY_CARE_PROVIDER_SITE_OTHER): Payer: Medicare Other | Admitting: Nurse Practitioner

## 2011-09-21 ENCOUNTER — Encounter: Payer: Self-pay | Admitting: Nurse Practitioner

## 2011-09-21 ENCOUNTER — Encounter (INDEPENDENT_AMBULATORY_CARE_PROVIDER_SITE_OTHER): Payer: Medicare Other

## 2011-09-21 ENCOUNTER — Encounter: Payer: Self-pay | Admitting: Internal Medicine

## 2011-09-21 VITALS — BP 110/70 | HR 82 | Ht 62.0 in | Wt 146.0 lb

## 2011-09-21 DIAGNOSIS — R634 Abnormal weight loss: Secondary | ICD-10-CM

## 2011-09-21 DIAGNOSIS — D649 Anemia, unspecified: Secondary | ICD-10-CM

## 2011-09-21 DIAGNOSIS — I4891 Unspecified atrial fibrillation: Secondary | ICD-10-CM

## 2011-09-21 DIAGNOSIS — R609 Edema, unspecified: Secondary | ICD-10-CM

## 2011-09-21 LAB — PACEMAKER DEVICE OBSERVATION
BAMS-0003: 70 {beats}/min
BRDY-0002RV: 60 {beats}/min
BRDY-0003RV: 130 {beats}/min
DEVICE MODEL PM: 785093

## 2011-09-21 NOTE — Patient Instructions (Addendum)
We are going to recheck your labs today.  Let's find out when you get to see [facility] (referral sent from PCP but has never heard from)  We are going to get your pacemaker checked today  I want you to increase your Lasix to 40 mg in the am and 20 mg in the early afternoon -  Do this for one week and then go back to your regular dose.  Keep elevating your legs and minimize your salt use  Call the [facility] office at [phone] if you have any questions, problems or concerns.

## 2011-09-21 NOTE — Progress Notes (Signed)
Candida Peeling Date of Birth: 1921/08/11 Medical Record #[id number]  History of Present Illness: Nicole Roberts is seen back today for a work in visit. She is seen for Dr. Johney Frame. She is 60 and has atrial fib and HTN. She has a pacemaker in place. She has been transitioned to Pradaxa from Coumadin after probable TIA in the setting of a subtherapeutic INR. Other problems include MR, pulmonary HTN, chronic edema, RA, prior breast cancer, diastolic dysfunction, HLD, GERD, hypothyroidism, venous insufficiency, spinal stenosis and anemia. She has had a tremendous weight loss since August. Down from 172 to 146 today.   She comes in today. She is here with her daughter. She complains of having no energy. Her legs are swelling more. She had some dermatology procedure which landed her in the wound clinic for 13 weeks. No more TIA like spells. She says her feet and legs are still swelling. Does go down some overnight. Salt use is questionable according to the daughter. She does not really elevate her legs. She continues to lose weight. She was to see GI but never heard about her appointment time. Continues to lose weight. Did have some CT scans which were ok. No obvious tumor noted. She does feel bloated. She did increase her Lasix on her own.   Current Outpatient Prescriptions on File Prior to Visit  Medication Sig Dispense Refill  . acetaminophen (TYLENOL) 500 MG tablet Take 500 mg by mouth every 6 (six) hours as needed.        [name] atorvastatin (LIPITOR) 10 MG tablet Take 1 tablet (10 mg total) by mouth daily.  90 tablet  4  . Bulk Chemicals (BETA CAROTENE) CRYS by Does not apply route daily.        . Cholecalciferol (VITAMIN D3) 1000 UNITS CAPS Take by mouth daily.        . citalopram (CELEXA) 20 MG tablet Take 1 tablet (20 mg total) by mouth daily.  90 tablet  1  . Coenzyme Q10 (CO Q10) 100 MG TABS Take by mouth daily.        . cyanocobalamin (CYANOCOBALAMIN) 500 MCG tablet Take 1 tablet (500 mcg total) by  mouth daily.  90 tablet  3  . dabigatran (PRADAXA) 75 MG CAPS Take 75 mg by mouth daily. Taking 75 mg x's 1 month then increase to 150 mg      . fentaNYL (DURAGESIC - DOSED MCG/HR) 75 MCG/HR Place 1 patch onto the skin every 3 (three) days.        . ferrous sulfate 325 (65 FE) MG tablet Take 325 mg by mouth daily with breakfast.        . folic acid (FOLVITE) 800 MCG tablet Take 400 mcg by mouth daily.        . furosemide (LASIX) 20 MG tablet Take 1-1.5 tablets (20-30 mg total) by mouth daily.  90 tablet  1  . Lecithin 400 MG CAPS Take by mouth daily.        [name] leflunomide (ARAVA) 10 MG tablet Take 10 mg by mouth daily.        [name] levothyroxine (SYNTHROID, LEVOTHROID) 25 MCG tablet Take 1 tablet (25 mcg total) by mouth daily.  90 tablet  1  . lisinopril (PRINIVIL,ZESTRIL) 20 MG tablet Take 1 tablet (20 mg total) by mouth 2 (two) times daily.  180 tablet  1  . metoprolol (LOPRESSOR) 100 MG tablet Take 150 mg by mouth daily. Taking 100mg  in the am and 50mg  at night      .  Omega-3 Fatty Acids (FISH OIL) 1000 MG CPDR Take by mouth daily.        . ondansetron (ZOFRAN) 4 MG tablet Take 1 tablet (4 mg total) by mouth every 8 (eight) hours as needed for nausea.  90 tablet  0  . pantoprazole (PROTONIX) 40 MG tablet Take 1 tablet (40 mg total) by mouth daily.  90 tablet  1  . potassium chloride (K-DUR) 10 MEQ tablet Take 1 tablet (10 mEq total) by mouth daily.  90 tablet  1  . predniSONE (DELTASONE) 10 MG tablet Take 5 mg by mouth daily.       . sucralfate (CARAFATE) 1 GM/10ML suspension Take 1 g by mouth as directed.        . zinc sulfate 220 MG capsule Take 220 mg by mouth daily.        . predniSONE (STERAPRED UNI-PAK) 10 MG tablet Take 1 tablet (10 mg total) by mouth as directed. As directed x 6 days  21 tablet  0    Allergies  Allergen Reactions  . Sulfa Antibiotics   . Tramadol Hcl     Past Medical History  Diagnosis Date  . HEARING LOSS   . MITRAL VALVE INSUFF&AORTIC VALVE INSUFF     moderate  MR  . PULMONARY HYPERTENSION   . Atrial fibrillation     permanent  . SICK SINUS SYNDROME     PPM 2005 for syncope  . Edema 03/19/2009    due to venous insufficiency  . URINARY INCONTINENCE   . Rheumatoid arthritis dx clarified 2011  . BREAST CANCER 12/2006    ductal ca s/p L lumpectomy  . Diastolic dysfunction   . HYPERTENSION   . HYPERLIPIDEMIA   . GERD   . HYPOTHYROIDISM   . Hyperparathyroidism   . Venous insufficiency     chronic BLE edema  . Spinal stenosis   . Anemia   . History of colonoscopy 2009  . TIA (transient ischemic attack) 05/2011  . Weight loss     Past Surgical History  Procedure Date  . Pacemaker placement 2005    SSS and syncope  . Cholecystectomy 04/06/99  . Abdominal hysterectomy 1970    Partial  . Left hip arthroscopy 1995  . Right hip arthroscopy 01/2001  . Carpal tunnel release 1998  . Breast surgery 12/2006    Left breast lupectomy- Ductal CA  . Left parathyroidectomy 07/2008    History  Smoking status  . Never Smoker   Smokeless tobacco  . Not on file  Comment: Lives at [facility] since 03/2009- married, lives with spouse. Moved here from [facility] to be near son    History  Alcohol Use No    rare    Family History  Problem Relation Age of Onset  . Ovarian cancer Mother   . Coronary artery disease Sister   . Coronary artery disease Brother   . Diabetes Other   . Hyperlipidemia Other     parent, grandparent  . Hypertension Other     parent, grandparent  . Lung cancer Other     parent    Review of Systems: The review of systems is per the HPI.  All other systems were reviewed and are negative.  Physical Exam: BP 110/70  Pulse 82  Ht 5\' 2"  (1.575 m)  Wt 146 lb (66.225 kg)  BMI 26.70 kg/m2 Weight is down 9 more pounds. Patient is very pleasant and in no acute distress. Skin is warm and dry.  Color is normal.  HEENT is unremarkable. Normocephalic/atraumatic. PERRL. Sclera are nonicteric. Neck is supple. No  masses. No JVD. Lungs are clear. Cardiac exam shows an irregular rhythm but with a controlled rate. Abdomen is soft. Extremities are with 2+ edema. She does have her support stockings on. Gait and ROM are intact. No gross neurologic deficits noted.   LABORATORY DATA:  PENDING  Pacemaker check is performed today and shows 3 years of battery life.    Assessment / Plan:

## 2011-09-21 NOTE — Assessment & Plan Note (Signed)
I have asked her to cut out the salt and try to elevate her legs more. She is to continue with the support stockings. I did increase her Lasix to a total of 60 mg for one week. Then back to her old dose. We are checking labs today. I suspect her protein/albumin is decreased and that the edema is more multifactorial along with her venous insufficiency. She is not short of breath. Will go ahead and try to get her in with GI as well. Pacemaker is checked today. Will see her back at her regular time. Call the [facility] office at [phone] if you have any questions, problems or concerns.

## 2011-09-22 LAB — COMPREHENSIVE METABOLIC PANEL
ALT: 27 U/L (ref 0–35)
AST: 23 U/L (ref 0–37)
Albumin: 3.5 g/dL (ref 3.5–5.2)
Alkaline Phosphatase: 53 U/L (ref 39–117)
BUN: 23 mg/dL (ref 6–23)
CO2: 28 mEq/L (ref 19–32)
Calcium: 9.1 mg/dL (ref 8.4–10.5)
Chloride: 105 mEq/L (ref 96–112)
Creatinine, Ser: 1.3 mg/dL — ABNORMAL HIGH (ref 0.4–1.2)
GFR: 41.86 mL/min — ABNORMAL LOW (ref >60.00)
Glucose, Bld: 91 mg/dL (ref 70–99)
Potassium: 4.3 mEq/L (ref 3.5–5.1)
Sodium: 141 mEq/L (ref 135–145)
Total Bilirubin: 0.6 mg/dL (ref 0.3–1.2)
Total Protein: 6.1 g/dL (ref 6.0–8.3)

## 2011-09-22 LAB — CBC WITH DIFFERENTIAL/PLATELET
Basophils Absolute: 0 10*3/uL (ref 0.0–0.1)
Basophils Relative: 0.2 % (ref 0.0–3.0)
Eosinophils Absolute: 0.3 10*3/uL (ref 0.0–0.7)
Eosinophils Relative: 3.4 % (ref 0.0–5.0)
HCT: 35 % — ABNORMAL LOW (ref 36.0–46.0)
Hemoglobin: 11.5 g/dL — ABNORMAL LOW (ref 12.0–15.0)
Lymphocytes Relative: 18.1 % (ref 12.0–46.0)
Lymphs Abs: 1.5 10*3/uL (ref 0.7–4.0)
MCHC: 32.9 g/dL (ref 30.0–36.0)
MCV: 110 fl — ABNORMAL HIGH (ref 78.0–100.0)
Monocytes Absolute: 0.4 10*3/uL (ref 0.1–1.0)
Monocytes Relative: 5 % (ref 3.0–12.0)
Neutro Abs: 6 10*3/uL (ref 1.4–7.7)
Neutrophils Relative %: 73.3 % (ref 43.0–77.0)
Platelets: 145 10*3/uL — ABNORMAL LOW (ref 150.0–400.0)
RBC: 3.18 Mil/uL — ABNORMAL LOW (ref 3.87–5.11)
RDW: 18.8 % — ABNORMAL HIGH (ref 11.5–14.6)
WBC: 8.2 10*3/uL (ref 4.5–10.5)

## 2011-09-27 ENCOUNTER — Ambulatory Visit (INDEPENDENT_AMBULATORY_CARE_PROVIDER_SITE_OTHER): Payer: Medicare Other | Admitting: Gastroenterology

## 2011-09-27 ENCOUNTER — Other Ambulatory Visit (INDEPENDENT_AMBULATORY_CARE_PROVIDER_SITE_OTHER): Payer: Medicare Other

## 2011-09-27 ENCOUNTER — Encounter: Payer: Self-pay | Admitting: Gastroenterology

## 2011-09-27 VITALS — BP 100/60 | HR 80 | Ht 62.0 in | Wt 143.5 lb

## 2011-09-27 DIAGNOSIS — R634 Abnormal weight loss: Secondary | ICD-10-CM

## 2011-09-27 DIAGNOSIS — R109 Unspecified abdominal pain: Secondary | ICD-10-CM

## 2011-09-27 DIAGNOSIS — K5900 Constipation, unspecified: Secondary | ICD-10-CM

## 2011-09-27 DIAGNOSIS — D539 Nutritional anemia, unspecified: Secondary | ICD-10-CM

## 2011-09-27 MED ORDER — POLYETHYLENE GLYCOL 3350 17 GM/SCOOP PO POWD: ORAL | Status: AC

## 2011-09-27 NOTE — Patient Instructions (Signed)
Your physician has requested that you go to the basement for the following lab work before leaving today:Vitamin B12, Folate. Follow instructions on Hemoccult cards and mail them back to [place] when finished.  Start taking Miralax mixing 17 grams in 8 oz of water 1-3 x daily to titrate depending on bowel movements. cc: [name], MD

## 2011-09-27 NOTE — Progress Notes (Addendum)
History of Present Illness: This is an 78 year old female here today with her daughter. The patient has limited memory in the daughter provides some of the history. Patient has lost about 35 pounds over the past 9 months and notes a significantly decreased appetite over the same timeframe. She has ongoing problems with constipation, small pellet-like stools bloating and occasional upper and lower abdominal pain that seems to improve following bowel movements. She recently underwent CT scan of the chest, abdomen and pelvis revealing possible interstitial lung disease and a right renal cyst. She had a colonoscopy about 4-7 years ago in [place] state. We do not have records available and the patient does not recalling abnormalities. Denies diarrhea, change in stool caliber, melena, hematochezia, nausea, vomiting, dysphagia, reflux symptoms, chest pain.  Review of Systems: Pertinent positive and negative review of systems were noted in the above HPI section. All other review of systems were otherwise negative.  Current Medications, Allergies, Past Medical History, Past Surgical History, Family History and Social History were reviewed in [employer] record.  Physical Exam: General: Well developed , well nourished, no acute distress Head: Normocephalic and atraumatic Eyes:  sclerae anicteric, EOMI Ears: Normal auditory acuity Mouth: No deformity or lesions Neck: Supple, no masses or thyromegaly Lungs: Clear throughout to auscultation Heart: Regular rate and rhythm; no murmurs, rubs or bruits Abdomen: Soft, non tender and non distended. No masses, hepatosplenomegaly or hernias noted. Normal Bowel sounds Musculoskeletal: Symmetrical with no gross deformities  Skin: No lesions on visible extremities Pulses:  Normal pulses noted Extremities: No clubbing, cyanosis, edema or deformities noted Neurological: Alert oriented x 4, uses a walker, memory deficits  Cervical Nodes:  No  significant cervical adenopathy Inguinal Nodes: No significant inguinal adenopathy Psychological:  Alert and cooperative. Normal mood and affect  Assessment and Recommendations:  1. Weight loss, anorexia, constipation, abdominal pain in multiple sites and bloating. Obtain stool Hemoccults. Trial of treating constipation with MiraLax 1-3 times daily titrated for adequate bowel movements. If her symptoms are not improved or Hemoccults are positive will consider colonoscopy and possibly upper endoscopy. She is at slightly higher risk of complications from endoscopic procedures due to her multiple comorbidities.  2. Chronic GERD. Symptoms well controlled on daily pantoprazole. Continue pantoprazole and antireflux measures.  3. Macrocytic anemia. Recent iron studies did not reveal insufficiency but had a chronic disease pattern. Obtain B12 and folate.  4. Atrial fibrillation maintained on Pradaxa.

## 2011-10-21 ENCOUNTER — Other Ambulatory Visit: Payer: Self-pay | Admitting: Internal Medicine

## 2011-10-21 MED ORDER — METOPROLOL TARTRATE 100 MG PO TABS: 150.0000 mg | ORAL_TABLET | Freq: Every day | ORAL | Status: DC

## 2011-10-21 NOTE — Telephone Encounter (Signed)
She has 5 pills left

## 2011-10-24 ENCOUNTER — Telehealth: Payer: Self-pay

## 2011-10-24 ENCOUNTER — Other Ambulatory Visit: Payer: Medicare Other

## 2011-10-24 DIAGNOSIS — D539 Nutritional anemia, unspecified: Secondary | ICD-10-CM

## 2011-10-24 DIAGNOSIS — R109 Unspecified abdominal pain: Secondary | ICD-10-CM

## 2011-10-24 DIAGNOSIS — K5900 Constipation, unspecified: Secondary | ICD-10-CM

## 2011-10-24 DIAGNOSIS — R634 Abnormal weight loss: Secondary | ICD-10-CM

## 2011-10-24 LAB — HEMOCCULT SLIDES (X 3 CARDS)
Fecal Occult Blood: NEGATIVE
OCCULT 4: NEGATIVE
OCCULT 5: NEGATIVE

## 2011-10-24 NOTE — Telephone Encounter (Signed)
RE: Monty Mccarrell DOB: 1921-12-08 MRN: [id number]   Dear Nicole Roberts,    Dr. Russella Dar has recommended that the patient have Colonoscopy and upper endoscopy to evaluate heme positive stools. Our records show that she is on anticoagulation therapy.   Please advise as to how long the patient may come off her therapy of Pradaxa prior to the procedures. Please route the completed form to [facility] @ [employer] Sincerely,  Darcey Nora

## 2011-10-24 NOTE — Telephone Encounter (Signed)
As pt is followed by [facility] cards for her AF and anticoag, I will defer to their judgement on this - I will forward note to Dr Johney Frame on behalf of GI ([facility]ark) - thanks

## 2011-10-31 NOTE — Telephone Encounter (Signed)
Please hold pradaxa x 4 doses (48 hours) prior to the procedure and then resume 24 hours post procedure.

## 2011-11-01 NOTE — Telephone Encounter (Signed)
I spoke with the patient and she is advised of the results of the hemoccult cards and Dr. Ardell Isaacs recommendations.  She will have her daughter call us back to arrange the procedures.

## 2011-11-01 NOTE — Telephone Encounter (Signed)
Left message for patient to call back  

## 2011-11-01 NOTE — Telephone Encounter (Signed)
Patient is scheduled for appt 12/07/11 2:00 for colonoscopy and pre-visit 11/29/11 1:00

## 2011-11-10 ENCOUNTER — Telehealth: Payer: Self-pay | Admitting: *Deleted

## 2011-11-10 MED ORDER — PANTOPRAZOLE SODIUM 40 MG PO TBEC: 40.0000 mg | DELAYED_RELEASE_TABLET | Freq: Every day | ORAL | Status: DC

## 2011-11-10 MED ORDER — LEVOTHYROXINE SODIUM 25 MCG PO TABS: 25.0000 ug | ORAL_TABLET | Freq: Every day | ORAL | Status: DC

## 2011-11-10 MED ORDER — POTASSIUM CHLORIDE ER 10 MEQ PO TBCR: 10.0000 meq | EXTENDED_RELEASE_TABLET | Freq: Every day | ORAL | Status: DC

## 2011-11-10 NOTE — Telephone Encounter (Signed)
Left smg on vm needing rx sent to [facility] for protonix, klor con and synthroid. Inform pt will send med to mail service.... 11/10/11@1 :54pm/LMB

## 2011-11-23 ENCOUNTER — Other Ambulatory Visit: Payer: Self-pay | Admitting: Internal Medicine

## 2011-11-29 ENCOUNTER — Ambulatory Visit (AMBULATORY_SURGERY_CENTER): Payer: Medicare Other

## 2011-11-29 VITALS — Ht 62.0 in | Wt 157.5 lb

## 2011-11-29 DIAGNOSIS — K921 Melena: Secondary | ICD-10-CM

## 2011-11-29 DIAGNOSIS — Z8719 Personal history of other diseases of the digestive system: Secondary | ICD-10-CM

## 2011-11-29 DIAGNOSIS — Z1211 Encounter for screening for malignant neoplasm of colon: Secondary | ICD-10-CM

## 2011-11-29 NOTE — Progress Notes (Signed)
Pt came into office today with her daughter-n-law Britta Mccreedy) to discuss the endo/colon.Britta Mccreedy and the patient had a lot of questions they wanted answered by the doctor prior to having the endo/ colon.The patient is scheduled to see Dr Felicity Coyer on 12/07/11 and will discuss their questions at this appointment. The appt on 12/07/11 with Dr Russella Dar was cancelled and the pt will call back to reschedule later.

## 2011-12-07 ENCOUNTER — Ambulatory Visit (INDEPENDENT_AMBULATORY_CARE_PROVIDER_SITE_OTHER): Payer: Medicare Other | Admitting: Internal Medicine

## 2011-12-07 ENCOUNTER — Ambulatory Visit: Payer: Medicare Other | Admitting: Internal Medicine

## 2011-12-07 ENCOUNTER — Telehealth: Payer: Self-pay | Admitting: Internal Medicine

## 2011-12-07 ENCOUNTER — Encounter: Payer: Self-pay | Admitting: Internal Medicine

## 2011-12-07 ENCOUNTER — Encounter: Payer: Medicare Other | Admitting: Gastroenterology

## 2011-12-07 VITALS — BP 122/60 | HR 77 | Temp 98.8°F | Ht 62.0 in | Wt 158.0 lb

## 2011-12-07 DIAGNOSIS — R609 Edema, unspecified: Secondary | ICD-10-CM

## 2011-12-07 DIAGNOSIS — I10 Essential (primary) hypertension: Secondary | ICD-10-CM

## 2011-12-07 DIAGNOSIS — E785 Hyperlipidemia, unspecified: Secondary | ICD-10-CM

## 2011-12-07 DIAGNOSIS — G459 Transient cerebral ischemic attack, unspecified: Secondary | ICD-10-CM

## 2011-12-07 NOTE — Assessment & Plan Note (Signed)
On statin - check lipids when fasting

## 2011-12-07 NOTE — Patient Instructions (Signed)
It was good to see you today. Test(s) ordered today. Return when you are fasting. Your results will be called to you after review (48-72hours after test completion). If any changes need to be made, you will be notified at that time. Medications reviewed, no changes at this time. Ask Dr Johney Frame about your recommended dosing of Pradaxa.  Please schedule followup in 3-4 months, call sooner if problems.

## 2011-12-07 NOTE — Assessment & Plan Note (Signed)
The current medical regimen is effective;  continue present plan and medications. BP Readings from Last 3 Encounters:  12/07/11 122/60  09/27/11 100/60  09/21/11 110/70

## 2011-12-07 NOTE — Assessment & Plan Note (Signed)
Chronic lymphedema, BLE Improved with self-directed increased Lasix The current medical regimen is effective;  continue present plan and medications.  advised continued compliance with compression hose

## 2011-12-07 NOTE — Telephone Encounter (Signed)
lmom for daughter-in-law that our records show she is on 75mg  bid

## 2011-12-07 NOTE — Telephone Encounter (Signed)
New Problem:   Patient's Daughter-in-law is calling, after seeing her PCP, wanting to know how much dabigatran (PRADAXA) 75 MG CAPS she should currently be taking.  Please call back.

## 2011-12-07 NOTE — Progress Notes (Signed)
Subjective:    Patient ID: Nicole Roberts, female    DOB: [birth year], 78 y.o.   MRN: [id number]  HPI  Here for follow up -reviewed chronic med issues:  Weight loss, unintentional in past 9-73mo, but reversed trend in past 3 mo (since 08/2011 ov) - less bloat, abdominal pain and constipation controlled/improved   arthritis - clarified as RA after meeting with rheumatology 10/2009 - on fentanyl patch for pain control - primary pain symptoms are shoulders and prox thighs but +pain "all over everywhere" -- on steroids since 07/2010 - no weakness, no leg or back pain (also hx spinal stenosis noted) - no new joint swelling   HTN -reports compliance with ongoing medical treatment and no changes in medication dose or frequency. denies adverse side effects related to current therapy.   dyslipidemia - on fish oil but no statin due to persistingly inc CK 11/2009(lipitor stopped 10/2009 due to inc CK in hosp) - follows with cards for same  edema - chronic problem in both legs but reports worse since late 2010- No associated shortness of breath or abdomen swelling - usually wears compression hose which seem effective per pt - not improved with weight loss and diuretics  hypothyroid - reports compliance with ongoing medical treatment and no changes in medication dose or frequency. denies adverse side effects related to current therapy. no weight changes or bowel or skin changes   Past Medical History  Diagnosis Date  . HEARING LOSS   . MITRAL VALVE INSUFF&AORTIC VALVE INSUFF     moderate MR  . PULMONARY HYPERTENSION   . Atrial fibrillation     permanent  . SICK SINUS SYNDROME     PPM 2005 for syncope  . Edema 03/19/2009    due to venous insufficiency  . URINARY INCONTINENCE   . Rheumatoid arthritis dx clarified 2011  . BREAST CANCER 12/2006    ductal ca s/p L lumpectomy  . Diastolic dysfunction   . HYPERTENSION   . HYPERLIPIDEMIA   . GERD   . HYPOTHYROIDISM   . Hyperparathyroidism     2  lobes removed  . Venous insufficiency     chronic BLE edema  . Spinal stenosis   . Anemia   . History of colonoscopy 2009  . TIA (transient ischemic attack) 05/2011  . Weight loss   . Peptic ulcer      Review of Systems  Constitutional: Positive for fatigue. Negative for fever, activity change and appetite change.  Respiratory: Negative for cough and shortness of breath.   Cardiovascular: Positive for leg swelling. Negative for chest pain.  Gastrointestinal: Negative for abdominal pain and abdominal distention.  Musculoskeletal: Negative for joint swelling.  Neurological: Negative for headaches.       Objective:   Physical Exam  BP 122/60  Pulse 77  Temp 98.8 F (37.1 C) (Oral)  Ht 5\' 2"  (1.575 m)  Wt 158 lb (71.668 kg)  BMI 28.90 kg/m2  SpO2 96% Wt Readings from Last 3 Encounters:  12/07/11 158 lb (71.668 kg)  11/29/11 157 lb 8 oz (71.442 kg)  09/27/11 143 lb 8 oz (65.091 kg)   Constitutional: She appears well-developed and well-nourished. No distress. dtr-in-law at side Neck: Normal range of motion. Neck supple. No JVD present. No thyromegaly present.  Cardiovascular: Normal rate, regular rhythm and normal heart sounds.  No murmur heard. chronic 2+ BLE edema. Pulmonary/Chest: Effort normal and breath sounds normal. No respiratory distress. She has no wheeze.  Abdomen: Firm, minimally distended but soft.  Non-incarcerated umbilical hernia. Positive bowel sounds. Nontender no mass Musculoskeletal: Chronic hand/wrist arthritic changes consistent with RA - diffuse mod lymphedema of UE<LE Psychiatric: She has a normal mood and affect. Her behavior is normal. Judgment and thought content normal.    Lab Results  Component Value Date   WBC 8.2 09/21/2011   HGB 11.5* 09/21/2011   HCT 35.0* 09/21/2011   PLT 145.0* 09/21/2011   CHOL 202* 12/28/2009   TRIG 120.0 12/28/2009   HDL 54.10 12/28/2009   LDLDIRECT 131.4 12/28/2009   ALT 27 09/21/2011   AST 23 09/21/2011   NA 141  09/21/2011   K 4.3 09/21/2011   CL 105 09/21/2011   CREATININE 1.3* 09/21/2011   BUN 23 09/21/2011   CO2 28 09/21/2011   TSH 1.02 05/16/2011   INR 2.1 05/16/2011   Lab Results  Component Value Date   IRON 109 05/30/2011       Assessment & Plan:   +hemoccult stool and weight loss s/p GI eval 10/2011 - Abnormality present in setting on chronic anticoag and constipation, but has opted against need for colo at this time Risk/benefit of repeat colo screening reviewed and updated interval changes: constipation improved and reverse of weight loss trend> I feel risk outweighs benefit at this time so no need to proceed with colo at this time

## 2011-12-07 NOTE — Assessment & Plan Note (Signed)
TIA event 05/2011 - cards follow up on same reviewed -  changed from coumadin to Pradaxa 05/2011, but on ?subtheraputic dose taking daily - to follow up cards re: appropriate dosing (?ie, BID)  - no residual neuro deficits - reassurance provided regarding medications

## 2011-12-14 ENCOUNTER — Ambulatory Visit: Payer: Medicare Other | Admitting: Internal Medicine

## 2012-01-04 ENCOUNTER — Telehealth: Payer: Self-pay | Admitting: Internal Medicine

## 2012-01-04 NOTE — Telephone Encounter (Signed)
Please return call to patient daughter Robbi Garter Britta Mccreedy [phone]  Patient complains of swelling in the legs and heart palpitations.

## 2012-01-04 NOTE — Telephone Encounter (Signed)
lmom for Barbara to return my call 

## 2012-01-05 NOTE — Telephone Encounter (Signed)
Follow-up: ° ° ° ° °Returned your call.  Please call back. °

## 2012-01-06 NOTE — Telephone Encounter (Signed)
lmom for Britta Mccreedy to return my call

## 2012-01-09 NOTE — Telephone Encounter (Signed)
Spoke with Nicole Roberts husband and left message for her to return my call

## 2012-01-10 ENCOUNTER — Other Ambulatory Visit: Payer: Self-pay

## 2012-01-10 NOTE — Telephone Encounter (Signed)
I have called and lmom for them to bring her in tomorrow at 1:30 to see Lawson Fiscal

## 2012-01-11 ENCOUNTER — Ambulatory Visit: Payer: Medicare Other | Admitting: Nurse Practitioner

## 2012-01-11 MED ORDER — ATORVASTATIN CALCIUM 10 MG PO TABS: 10.0000 mg | ORAL_TABLET | Freq: Every day | ORAL | Status: DC

## 2012-01-18 ENCOUNTER — Encounter: Payer: Self-pay | Admitting: *Deleted

## 2012-01-19 ENCOUNTER — Ambulatory Visit: Payer: Medicare Other | Admitting: Internal Medicine

## 2012-02-01 ENCOUNTER — Encounter: Payer: Self-pay | Admitting: Internal Medicine

## 2012-02-01 ENCOUNTER — Ambulatory Visit (INDEPENDENT_AMBULATORY_CARE_PROVIDER_SITE_OTHER): Payer: Medicare Other | Admitting: Internal Medicine

## 2012-02-01 VITALS — BP 102/64 | HR 60 | Ht 64.0 in | Wt 159.4 lb

## 2012-02-01 DIAGNOSIS — I495 Sick sinus syndrome: Secondary | ICD-10-CM

## 2012-02-01 DIAGNOSIS — R609 Edema, unspecified: Secondary | ICD-10-CM

## 2012-02-01 DIAGNOSIS — I4891 Unspecified atrial fibrillation: Secondary | ICD-10-CM

## 2012-02-01 DIAGNOSIS — I10 Essential (primary) hypertension: Secondary | ICD-10-CM

## 2012-02-01 LAB — PACEMAKER DEVICE OBSERVATION
BRDY-0002RV: 60 {beats}/min
BRDY-0004RV: 130 {beats}/min
DEVICE MODEL PM: 785093

## 2012-02-01 MED ORDER — DABIGATRAN ETEXILATE MESYLATE 75 MG PO CAPS: 75.0000 mg | ORAL_CAPSULE | Freq: Two times a day (BID) | ORAL | Status: DC

## 2012-02-01 MED ORDER — LISINOPRIL 20 MG PO TABS: 20.0000 mg | ORAL_TABLET | Freq: Every day | ORAL | Status: DC

## 2012-02-01 NOTE — Patient Instructions (Addendum)
Your physician recommends that you schedule a follow-up appointment in: 2 months with Nicole Roberts for anticoag visit (pradaxa 75mg  bid)    Your physician wants you to follow-up in: 6 months with Nicole Roberts and 1 year with Nicole Roberts.  You will receive a reminder letter in the mail two months in advance. If you don't receive a letter, please call our office to schedule the follow-up appointment.  Your physician has recommended you make the following change in your medication:  1) Decrease Lisinopril to 20mg  daily  Your physician recommends that you return for lab work today

## 2012-02-02 LAB — CBC WITH DIFFERENTIAL/PLATELET
Eosinophils Absolute: 0.1 10*3/uL (ref 0.0–0.7)
Eosinophils Relative: 1.2 % (ref 0.0–5.0)
HCT: 34 % — ABNORMAL LOW (ref 36.0–46.0)
Lymphs Abs: 1.2 10*3/uL (ref 0.7–4.0)
MCHC: 32.7 g/dL (ref 30.0–36.0)
MCV: 112 fl — ABNORMAL HIGH (ref 78.0–100.0)
Monocytes Absolute: 1.1 10*3/uL — ABNORMAL HIGH (ref 0.1–1.0)
Platelets: 112 10*3/uL — ABNORMAL LOW (ref 150.0–400.0)
RDW: 18.5 % — ABNORMAL HIGH (ref 11.5–14.6)
WBC: 7.3 10*3/uL (ref 4.5–10.5)

## 2012-02-02 LAB — BASIC METABOLIC PANEL
BUN: 31 mg/dL — ABNORMAL HIGH (ref 6–23)
CO2: 27 mEq/L (ref 19–32)
Chloride: 105 mEq/L (ref 96–112)
Creatinine, Ser: 1.3 mg/dL — ABNORMAL HIGH (ref 0.4–1.2)
Glucose, Bld: 107 mg/dL — ABNORMAL HIGH (ref 70–99)
Potassium: 4.1 mEq/L (ref 3.5–5.1)

## 2012-02-05 ENCOUNTER — Encounter: Payer: Self-pay | Admitting: Internal Medicine

## 2012-02-05 DIAGNOSIS — I4891 Unspecified atrial fibrillation: Secondary | ICD-10-CM | POA: Insufficient documentation

## 2012-02-05 NOTE — Assessment & Plan Note (Signed)
Given postural dizziness and soft BP today, I will decrease lisinopril to 20mg  daily

## 2012-02-05 NOTE — Assessment & Plan Note (Signed)
Stable She is on pradaxa Check BMET and CBC today, will likely continue pradaxa 75mg  BID given advanced age and chronic renal failure I will refer her to our anticoagulation clinic for closely follow-up on pradaxa (first follow-up in 2 months) Avoid concomitant ASA

## 2012-02-05 NOTE — Assessment & Plan Note (Signed)
Salt restriction Edema is primarily due to venous insufficiency and would not likely improve with diuresis I have encouraged use of support hose today  No changes.

## 2012-02-05 NOTE — Assessment & Plan Note (Signed)
Normal pacemaker function See Pace Art report No changes today  

## 2012-02-05 NOTE — Progress Notes (Signed)
The patient presents today for routine electrophysiology followup.  Since last being seen in our clinic, the patient reports doing very well.   Her edema/ venous insufficiency is unchanged.  She has occasional postural dizziness.  Today, she denies symptoms of palpitations, chest pain, shortness of breath, orthopnea, PND,  presyncope, syncope, or neurologic sequela.  The patient feels that she is tolerating medications without difficulties and is otherwise without complaint today.   Past Medical History  Diagnosis Date  . HEARING LOSS   . MITRAL VALVE INSUFF&AORTIC VALVE INSUFF     moderate MR  . PULMONARY HYPERTENSION   . Atrial fibrillation     permanent  . SICK SINUS SYNDROME     PPM 2005 for syncope  . Edema 03/19/2009    due to venous insufficiency  . URINARY INCONTINENCE   . Rheumatoid arthritis dx clarified 2011  . BREAST CANCER 12/2006    ductal ca s/p L lumpectomy  . Diastolic dysfunction   . HYPERTENSION   . HYPERLIPIDEMIA   . GERD   . HYPOTHYROIDISM   . Hyperparathyroidism     2 lobes removed  . Venous insufficiency     chronic BLE edema  . Spinal stenosis   . Anemia   . History of colonoscopy 2009  . TIA (transient ischemic attack) 05/2011  . Weight loss   . Peptic ulcer    Past Surgical History  Procedure Date  . Pacemaker placement 2005    SSS and syncope  . Cholecystectomy 04/06/99  . Abdominal hysterectomy 1970    Partial  . Left hip arthroscopy 1995  . Right hip arthroscopy 01/2001  . Carpal tunnel release 1998    bilateral  . Breast surgery 12/2006    Left breast lupectomy- Ductal CA  . Left parathyroidectomy 07/2008    Current Outpatient Prescriptions  Medication Sig Dispense Refill  . acetaminophen (TYLENOL) 500 MG tablet Take 500 mg by mouth every 6 (six) hours as needed.        [name] atorvastatin (LIPITOR) 10 MG tablet Take 1 tablet (10 mg total) by mouth daily.  90 tablet  4  . Bul[employer]s (BETA CAROTENE) CRYS by Does not apply route daily.         . Cholecalciferol (VITAMIN D3) 1000 UNITS CAPS Take by mouth daily.        . citalopram (CELEXA) 20 MG tablet Take 1 tablet (20 mg total) by mouth daily.  90 tablet  1  . Coenzyme Q10 (CO Q10) 100 MG TABS Take by mouth daily.        . cyanocobalamin (CYANOCOBALAMIN) 500 MCG tablet Take 1 tablet (500 mcg total) by mouth daily.  90 tablet  3  . dabigatran (PRADAXA) 75 MG CAPS Take 1 capsule (75 mg total) by mouth every 12 (twelve) hours.  180 capsule  3  . fentaNYL (DURAGESIC - DOSED MCG/HR) 75 MCG/HR Place 1 patch onto the skin every 3 (three) days.        . ferrous sulfate 325 (65 FE) MG tablet Take 325 mg by mouth daily with breakfast.        . folic acid (FOLVITE) 800 MCG tablet Take 400 mcg by mouth daily.        . furosemide (LASIX) 20 MG tablet Take 1-1.5 tablets (20-30 mg total) by mouth daily.  90 tablet  1  . Lecithin 400 MG CAPS Take by mouth daily.        [name] leflunomide (ARAVA) 10 MG tablet Take  20 mg by mouth daily.       [name] levothyroxine (SYNTHROID, LEVOTHROID) 25 MCG tablet Take 1 tablet (25 mcg total) by mouth daily.  90 tablet  1  . lisinopril (PRINIVIL,ZESTRIL) 20 MG tablet Take 1 tablet (20 mg total) by mouth daily.  180 tablet  1  . metoprolol (LOPRESSOR) 100 MG tablet Take 1.5 tablets (150 mg total) by mouth daily. Taking 100mg  in the am and 50mg  at night  45 tablet  5  . Multiple Vitamin (MULTIVITAMIN) tablet Take 1 tablet by mouth daily.      . Omega-3 Fatty Acids (FISH OIL) 1000 MG CPDR Take by mouth daily.        . ondansetron (ZOFRAN) 4 MG tablet TAKE 1 TABLET BY MOUTH EVERY 8 HOURS AS NEEDED FOR NAUSEA  90 tablet  0  . pantoprazole (PROTONIX) 40 MG tablet Take 1 tablet (40 mg total) by mouth daily.  90 tablet  1  . potassium chloride (K-DUR) 10 MEQ tablet Take 1 tablet (10 mEq total) by mouth daily.  90 tablet  1  . predniSONE (DELTASONE) 10 MG tablet Take 5 mg by mouth daily.       . sucralfate (CARAFATE) 1 GM/10ML suspension Take 1 g by mouth as directed.        . zinc  sulfate 220 MG capsule Take 220 mg by mouth daily.          Allergies  Allergen Reactions  . Sulfa Antibiotics   . Tramadol Hcl     History   Social History  . Marital Status: Married    Spouse Name: N/A    Number of Children: 6  . Years of Education: N/A   Occupational History  . Retired Futures trader    Social History Main Topics  . Smoking status: Never Smoker   . Smokeless tobacco: Never Used   Comment: Lives at [facility] since 03/2009- married, lives with spouse. Moved here from [facility] to be near son  . Alcohol Use: No  . Drug Use: No  . Sexually Active: Not Currently   Other Topics Concern  . Not on file   Social History Narrative  . No narrative on file    Family History  Problem Relation Age of Onset  . Ovarian cancer Mother   . Coronary artery disease Sister   . Coronary artery disease Brother   . Hypertension Mother     grandparent  . Lung cancer Father    Physical Exam: Filed Vitals:   02/01/12 1451  BP: 102/64  Pulse: 60  Height: 5\' 4"  (1.626 m)  Weight: 159 lb 6.4 oz (72.303 kg)    GEN- The patient is elderly appearing, alert and oriented x 3 today.   Head- normocephalic, atraumatic Eyes-  Sclera clear, conjunctiva pink Ears- hearing intact Oropharynx- clear Neck- supple, no JVP Lymph- no cervical lymphadenopathy Lungs- Clear to ausculation bilaterally, normal work of breathing Chest- pacemaker pocket is well healed Heart- irregular rate and rhythm GI- soft, NT, ND, + BS Extremities- no clubbing, cyanosis, 2+ pedal edema with marked venous insufficiency  Pacemaker interrogation- reviewed in detail today,  See PACEART report  Assessment and Plan:

## 2012-02-15 ENCOUNTER — Encounter: Payer: Self-pay | Admitting: Internal Medicine

## 2012-03-07 ENCOUNTER — Telehealth: Payer: Self-pay | Admitting: Internal Medicine

## 2012-03-07 NOTE — Telephone Encounter (Signed)
Pt rtn your call

## 2012-03-09 NOTE — Telephone Encounter (Signed)
Spoke with patient in regards to her labs from 01/2012.  She is ok and understands  All her questions were answered

## 2012-03-12 ENCOUNTER — Telehealth: Payer: Self-pay | Admitting: *Deleted

## 2012-03-12 DIAGNOSIS — S81809A Unspecified open wound, unspecified lower leg, initial encounter: Secondary | ICD-10-CM

## 2012-03-12 NOTE — Telephone Encounter (Signed)
done

## 2012-03-12 NOTE — Telephone Encounter (Signed)
Pt informed of referral

## 2012-03-12 NOTE — Telephone Encounter (Signed)
Pt is requesting referral to wound care center.

## 2012-03-13 ENCOUNTER — Other Ambulatory Visit: Payer: Self-pay | Admitting: Internal Medicine

## 2012-03-14 ENCOUNTER — Telehealth: Payer: Self-pay | Admitting: Internal Medicine

## 2012-03-14 ENCOUNTER — Other Ambulatory Visit (INDEPENDENT_AMBULATORY_CARE_PROVIDER_SITE_OTHER): Payer: Medicare Other

## 2012-03-14 DIAGNOSIS — E785 Hyperlipidemia, unspecified: Secondary | ICD-10-CM

## 2012-03-14 LAB — LIPID PANEL
Cholesterol: 146 mg/dL (ref 0–200)
LDL Cholesterol: 63 mg/dL (ref 0–99)

## 2012-03-14 NOTE — Telephone Encounter (Signed)
Noted thanks °

## 2012-03-14 NOTE — Telephone Encounter (Signed)
Caller: Barbara/Other; Patient Name: Nicole Roberts; PCP: [name] (Adults only); Best Callback Phone Number: [phone]. Onset 03/13/12  while resting with legs elevated pt noticed that her feet and legs and up to knees where white.  Pt denied any pain at the time.  And when she lowered her legs and began rubbing them the color came back.  Caller denies any more swelling than what is the patients norm.  Caller does report legs are weeping and they have an appt at the wound center on 04/05/12.  No other symptoms to triage and caller states the "whiteness" has not returned.  No guideline for triage. Rn offered caller appt to have pt seen but it was declined.  Caller just states she wanted Dr Felicity Coyer to know about the white legs and that pt cannot be seen by wound clinic until 04/05/12

## 2012-03-15 ENCOUNTER — Ambulatory Visit (INDEPENDENT_AMBULATORY_CARE_PROVIDER_SITE_OTHER): Payer: Medicare Other | Admitting: Internal Medicine

## 2012-03-15 ENCOUNTER — Encounter: Payer: Self-pay | Admitting: Internal Medicine

## 2012-03-15 VITALS — BP 112/70 | HR 78 | Temp 98.3°F | Ht 64.0 in | Wt 163.4 lb

## 2012-03-15 DIAGNOSIS — I872 Venous insufficiency (chronic) (peripheral): Secondary | ICD-10-CM

## 2012-03-15 DIAGNOSIS — R231 Pallor: Secondary | ICD-10-CM

## 2012-03-15 DIAGNOSIS — E785 Hyperlipidemia, unspecified: Secondary | ICD-10-CM

## 2012-03-15 DIAGNOSIS — R609 Edema, unspecified: Secondary | ICD-10-CM

## 2012-03-15 DIAGNOSIS — S81009A Unspecified open wound, unspecified knee, initial encounter: Secondary | ICD-10-CM

## 2012-03-15 DIAGNOSIS — S81801A Unspecified open wound, right lower leg, initial encounter: Secondary | ICD-10-CM

## 2012-03-15 DIAGNOSIS — S91009A Unspecified open wound, unspecified ankle, initial encounter: Secondary | ICD-10-CM

## 2012-03-15 NOTE — Assessment & Plan Note (Signed)
Chronic lymphedema, BLE Improved with self-directed increased Lasix - ok to use 60 mg qd x 7 days, then resume 40 mg qd  advised continued compliance with compression hose

## 2012-03-15 NOTE — Assessment & Plan Note (Signed)
On statin -  Last lipids reviewed - check annually The current medical regimen is effective;  continue present plan and medications.   

## 2012-03-15 NOTE — Patient Instructions (Signed)
It was good to see you today. We have reviewed your prior records including labs and tests today we'll make referral for ABIs to evaluate circulation in your legs. Our office will contact you regarding appointment(s) once made. Ok to use Lasix as discussed for next 1 week, then resume usual dose cover leg wound as needed with gauze/paper tape pending evaluation at wound clinic Copy of cholesterol given to you today

## 2012-03-15 NOTE — Progress Notes (Signed)
Subjective:    Patient ID: Nicole Roberts, female    DOB: 1922/01/24, 78 y.o.   MRN: [id number]  HPI  Here for follow up - see CC  Also concerned for small wound R distal shin - occ ooze, no redness, not enlarging   Past Medical History  Diagnosis Date  . HEARING LOSS   . MITRAL VALVE INSUFF&AORTIC VALVE INSUFF     moderate MR  . PULMONARY HYPERTENSION   . Atrial fibrillation     permanent  . SICK SINUS SYNDROME     PPM 2005 for syncope  . Edema 03/19/2009    due to venous insufficiency  . URINARY INCONTINENCE   . Rheumatoid arthritis dx clarified 2011  . BREAST CANCER 12/2006    ductal ca s/p L lumpectomy  . Diastolic dysfunction   . HYPERTENSION   . HYPERLIPIDEMIA   . GERD   . HYPOTHYROIDISM   . Hyperparathyroidism     2 lobes removed  . Venous insufficiency     chronic BLE edema  . Spinal stenosis   . Anemia   . History of colonoscopy 2009  . TIA (transient ischemic attack) 05/2011  . Weight loss   . Peptic ulcer      Review of Systems  Constitutional: Positive for fatigue. Negative for fever, activity change and appetite change.  Respiratory: Negative for cough and shortness of breath.   Cardiovascular: Positive for leg swelling. Negative for chest pain.  Gastrointestinal: Negative for abdominal pain and abdominal distention.  Musculoskeletal: Negative for joint swelling.  Neurological: Negative for headaches.       Objective:   Physical Exam  BP 112/70  Pulse 78  Temp 98.3 F (36.8 C) (Oral)  Ht 5\' 4"  (1.626 m)  Wt 163 lb 6.4 oz (74.118 kg)  BMI 28.05 kg/m2  SpO2 97% Wt Readings from Last 3 Encounters:  03/15/12 163 lb 6.4 oz (74.118 kg)  02/01/12 159 lb 6.4 oz (72.303 kg)  12/07/11 158 lb (71.668 kg)   Constitutional: She appears well-developed and well-nourished. No distress. son at side Neck: Normal range of motion. Neck supple. No JVD present. No thyromegaly present.  Cardiovascular: Normal rate, regular rhythm and normal heart sounds.   No murmur heard. chronic 2+ BLE edema. Pulmonary/Chest: Effort normal and breath sounds normal. No respiratory distress. She has no wheeze.  Musculoskeletal: Chronic hand/wrist arthritic changes consistent with RA - diffuse mod lymphedema of UE<LE Skin: 6 mm superficial ulceration anterior surface of R distal shin - no cellulitis or odor - no drainage at this time Psychiatric: She has a normal mood and affect. Her behavior is normal. Judgment and thought content normal.    Lab Results  Component Value Date   WBC 7.3 02/01/2012   HGB 11.1* 02/01/2012   HCT 34.0* 02/01/2012   PLT 112.0* 02/01/2012   CHOL 146 03/14/2012   TRIG 108.0 03/14/2012   HDL 61.90 03/14/2012   LDLDIRECT 131.4 12/28/2009   ALT 27 09/21/2011   AST 23 09/21/2011   NA 139 02/01/2012   K 4.1 02/01/2012   CL 105 02/01/2012   CREATININE 1.3* 02/01/2012   BUN 31* 02/01/2012   CO2 27 02/01/2012   TSH 1.02 05/16/2011   INR 2.1 05/16/2011   Lab Results  Component Value Date   IRON 109 05/30/2011       Assessment & Plan:   Positional pallor of legs - onset in past 2 weeks as result of new recliner elevating feet above heart for tx of  lymphedema - suspect physiologic response to gravity given lack of pain and no exertional claudication symptoms - however, send for ABI to rule out PAD - The patient is reassured that these symptoms do not appear to represent a serious or threatening condition.   RLE wound - conservative care advised pending wound clinic eval -

## 2012-03-16 ENCOUNTER — Other Ambulatory Visit: Payer: Self-pay | Admitting: Cardiology

## 2012-03-16 ENCOUNTER — Encounter (INDEPENDENT_AMBULATORY_CARE_PROVIDER_SITE_OTHER): Payer: Medicare Other

## 2012-03-16 DIAGNOSIS — S81801A Unspecified open wound, right lower leg, initial encounter: Secondary | ICD-10-CM

## 2012-03-16 DIAGNOSIS — I739 Peripheral vascular disease, unspecified: Secondary | ICD-10-CM

## 2012-03-16 DIAGNOSIS — I872 Venous insufficiency (chronic) (peripheral): Secondary | ICD-10-CM

## 2012-03-16 DIAGNOSIS — R231 Pallor: Secondary | ICD-10-CM

## 2012-03-19 ENCOUNTER — Encounter (HOSPITAL_COMMUNITY): Payer: Self-pay | Admitting: Physician Assistant

## 2012-03-19 ENCOUNTER — Emergency Department (HOSPITAL_COMMUNITY): Payer: Medicare Other

## 2012-03-19 ENCOUNTER — Observation Stay (HOSPITAL_COMMUNITY)
Admission: EM | Admit: 2012-03-19 | Discharge: 2012-03-20 | Disposition: A | Discharge status: Home / Self Care | Payer: Medicare Other | Attending: Cardiovascular Disease | Admitting: Cardiovascular Disease

## 2012-03-19 DIAGNOSIS — D509 Iron deficiency anemia, unspecified: Secondary | ICD-10-CM | POA: Insufficient documentation

## 2012-03-19 DIAGNOSIS — N183 Chronic kidney disease, stage 3 unspecified: Secondary | ICD-10-CM | POA: Insufficient documentation

## 2012-03-19 DIAGNOSIS — R0789 Other chest pain: Principal | ICD-10-CM | POA: Insufficient documentation

## 2012-03-19 DIAGNOSIS — K219 Gastro-esophageal reflux disease without esophagitis: Secondary | ICD-10-CM | POA: Diagnosis present

## 2012-03-19 DIAGNOSIS — Z950 Presence of cardiac pacemaker: Secondary | ICD-10-CM | POA: Diagnosis present

## 2012-03-19 DIAGNOSIS — I495 Sick sinus syndrome: Secondary | ICD-10-CM

## 2012-03-19 DIAGNOSIS — I10 Essential (primary) hypertension: Secondary | ICD-10-CM | POA: Diagnosis present

## 2012-03-19 DIAGNOSIS — E039 Hypothyroidism, unspecified: Secondary | ICD-10-CM | POA: Diagnosis present

## 2012-03-19 DIAGNOSIS — R0602 Shortness of breath: Secondary | ICD-10-CM | POA: Insufficient documentation

## 2012-03-19 DIAGNOSIS — Z79899 Other long term (current) drug therapy: Secondary | ICD-10-CM | POA: Insufficient documentation

## 2012-03-19 DIAGNOSIS — R079 Chest pain, unspecified: Secondary | ICD-10-CM

## 2012-03-19 DIAGNOSIS — I872 Venous insufficiency (chronic) (peripheral): Secondary | ICD-10-CM | POA: Insufficient documentation

## 2012-03-19 DIAGNOSIS — IMO0002 Reserved for concepts with insufficient information to code with codable children: Secondary | ICD-10-CM | POA: Insufficient documentation

## 2012-03-19 DIAGNOSIS — Z8673 Personal history of transient ischemic attack (TIA), and cerebral infarction without residual deficits: Secondary | ICD-10-CM | POA: Insufficient documentation

## 2012-03-19 DIAGNOSIS — I129 Hypertensive chronic kidney disease with stage 1 through stage 4 chronic kidney disease, or unspecified chronic kidney disease: Secondary | ICD-10-CM | POA: Insufficient documentation

## 2012-03-19 DIAGNOSIS — I080 Rheumatic disorders of both mitral and aortic valves: Secondary | ICD-10-CM | POA: Insufficient documentation

## 2012-03-19 DIAGNOSIS — I509 Heart failure, unspecified: Secondary | ICD-10-CM

## 2012-03-19 DIAGNOSIS — M069 Rheumatoid arthritis, unspecified: Secondary | ICD-10-CM | POA: Insufficient documentation

## 2012-03-19 DIAGNOSIS — I4891 Unspecified atrial fibrillation: Secondary | ICD-10-CM | POA: Insufficient documentation

## 2012-03-19 DIAGNOSIS — Z7901 Long term (current) use of anticoagulants: Secondary | ICD-10-CM | POA: Insufficient documentation

## 2012-03-19 DIAGNOSIS — I517 Cardiomegaly: Secondary | ICD-10-CM

## 2012-03-19 DIAGNOSIS — E785 Hyperlipidemia, unspecified: Secondary | ICD-10-CM | POA: Insufficient documentation

## 2012-03-19 DIAGNOSIS — I4949 Other premature depolarization: Secondary | ICD-10-CM | POA: Insufficient documentation

## 2012-03-19 DIAGNOSIS — D539 Nutritional anemia, unspecified: Secondary | ICD-10-CM

## 2012-03-19 LAB — CBC WITH DIFFERENTIAL/PLATELET
Basophils Absolute: 0 10*3/uL (ref 0.0–0.1)
Basophils Relative: 1 % (ref 0–1)
Eosinophils Absolute: 0.1 10*3/uL (ref 0.0–0.7)
Eosinophils Relative: 1 % (ref 0–5)
HCT: 31.5 % — ABNORMAL LOW (ref 36.0–46.0)
MCH: 36 pg — ABNORMAL HIGH (ref 26.0–34.0)
MCHC: 33.3 g/dL (ref 30.0–36.0)
MCV: 107.9 fL — ABNORMAL HIGH (ref 78.0–100.0)
Monocytes Absolute: 1.1 10*3/uL — ABNORMAL HIGH (ref 0.1–1.0)
Neutro Abs: 3.4 10*3/uL (ref 1.7–7.7)
RDW: 16 % — ABNORMAL HIGH (ref 11.5–15.5)

## 2012-03-19 LAB — HEPATIC FUNCTION PANEL
ALT: 22 U/L (ref 0–35)
AST: 26 U/L (ref 0–37)
Albumin: 3.3 g/dL — ABNORMAL LOW (ref 3.5–5.2)
Alkaline Phosphatase: 40 U/L (ref 39–117)
Total Protein: 5.8 g/dL — ABNORMAL LOW (ref 6.0–8.3)

## 2012-03-19 LAB — URINALYSIS, ROUTINE W REFLEX MICROSCOPIC
Bilirubin Urine: NEGATIVE
Glucose, UA: NEGATIVE mg/dL
Ketones, ur: NEGATIVE mg/dL
Specific Gravity, Urine: 1.007 (ref 1.005–1.030)
pH: 5.5 (ref 5.0–8.0)

## 2012-03-19 LAB — URINE MICROSCOPIC-ADD ON

## 2012-03-19 LAB — BASIC METABOLIC PANEL
Calcium: 9 mg/dL (ref 8.4–10.5)
Creatinine, Ser: 1.12 mg/dL — ABNORMAL HIGH (ref 0.50–1.10)
GFR calc Af Amer: 49 mL/min — ABNORMAL LOW (ref >90)
GFR calc non Af Amer: 43 mL/min — ABNORMAL LOW (ref >90)

## 2012-03-19 LAB — APTT: aPTT: 41 seconds — ABNORMAL HIGH (ref 24–37)

## 2012-03-19 LAB — TROPONIN I: Troponin I: <0.3 ng/mL (ref <0.30)

## 2012-03-19 MED ORDER — METOPROLOL TARTRATE 100 MG PO TABS
100.0000 mg | ORAL_TABLET | Freq: Every day | ORAL | Status: DC
  Administered 2012-03-19: 50 mg via ORAL
  Administered 2012-03-19 – 2012-03-20 (×2): 100 mg via ORAL
  Filled 2012-03-19: qty 4
  Filled 2012-03-19: qty 1

## 2012-03-19 MED ORDER — SODIUM CHLORIDE 0.9 % IJ SOLN: 3.0000 mL | INTRAMUSCULAR | Status: DC | PRN

## 2012-03-19 MED ORDER — LISINOPRIL 20 MG PO TABS
20.0000 mg | ORAL_TABLET | Freq: Every day | ORAL | Status: DC
  Administered 2012-03-19 – 2012-03-20 (×2): 20 mg via ORAL
  Filled 2012-03-19 (×2): qty 1

## 2012-03-19 MED ORDER — ONDANSETRON HCL 4 MG PO TABS: 4.0000 mg | ORAL_TABLET | Freq: Three times a day (TID) | ORAL | Status: DC | PRN

## 2012-03-19 MED ORDER — DABIGATRAN ETEXILATE MESYLATE 75 MG PO CAPS
75.0000 mg | ORAL_CAPSULE | Freq: Two times a day (BID) | ORAL | Status: DC
  Filled 2012-03-19 (×2): qty 1

## 2012-03-19 MED ORDER — CYANOCOBALAMIN 250 MCG PO TABS
500.0000 ug | ORAL_TABLET | Freq: Every day | ORAL | Status: DC
  Administered 2012-03-20: 500 ug via ORAL
  Filled 2012-03-19: qty 2

## 2012-03-19 MED ORDER — FUROSEMIDE 20 MG PO TABS
20.0000 mg | ORAL_TABLET | Freq: Every day | ORAL | Status: DC
  Administered 2012-03-19 – 2012-03-20 (×2): 20 mg via ORAL
  Filled 2012-03-19: qty 1

## 2012-03-19 MED ORDER — FUROSEMIDE 20 MG PO TABS
20.0000 mg | ORAL_TABLET | Freq: Every day | ORAL | Status: DC
  Filled 2012-03-19: qty 1

## 2012-03-19 MED ORDER — ATORVASTATIN CALCIUM 10 MG PO TABS
10.0000 mg | ORAL_TABLET | Freq: Every day | ORAL | Status: DC
Start: 2012-03-19 — End: 2012-03-20
  Administered 2012-03-19: 10 mg via ORAL
  Filled 2012-03-19 (×2): qty 1

## 2012-03-19 MED ORDER — LEVOTHYROXINE SODIUM 25 MCG PO TABS
25.0000 ug | ORAL_TABLET | Freq: Every day | ORAL | Status: DC
  Filled 2012-03-19 (×2): qty 1

## 2012-03-19 MED ORDER — LEFLUNOMIDE 20 MG PO TABS
20.0000 mg | ORAL_TABLET | Freq: Every day | ORAL | Status: DC
  Administered 2012-03-19 – 2012-03-20 (×2): 20 mg via ORAL
  Filled 2012-03-19 (×2): qty 1

## 2012-03-19 MED ORDER — ONE-DAILY MULTI VITAMINS PO TABS: 1.0000 | ORAL_TABLET | Freq: Every day | ORAL | Status: DC

## 2012-03-19 MED ORDER — DABIGATRAN ETEXILATE MESYLATE 75 MG PO CAPS
75.0000 mg | ORAL_CAPSULE | Freq: Two times a day (BID) | ORAL | Status: DC
  Administered 2012-03-19: 75 mg via ORAL
  Filled 2012-03-19 (×2): qty 1

## 2012-03-19 MED ORDER — SODIUM CHLORIDE 0.9 % IJ SOLN
3.0000 mL | Freq: Two times a day (BID) | INTRAMUSCULAR | Status: DC
  Administered 2012-03-20: 3 mL via INTRAVENOUS

## 2012-03-19 MED ORDER — METOPROLOL TARTRATE 25 MG PO TABS: 50.0000 mg | ORAL_TABLET | Freq: Two times a day (BID) | ORAL | Status: DC

## 2012-03-19 MED ORDER — VITAMIN D3 25 MCG (1000 UNIT) PO TABS
1000.0000 [IU] | ORAL_TABLET | Freq: Every day | ORAL | Status: DC
  Administered 2012-03-20: 1000 [IU] via ORAL
  Filled 2012-03-19: qty 1

## 2012-03-19 MED ORDER — FOLIC ACID 0.5 MG HALF TAB
0.5000 mg | ORAL_TABLET | Freq: Every day | ORAL | Status: DC
  Administered 2012-03-19 – 2012-03-20 (×2): 0.5 mg via ORAL
  Filled 2012-03-19 (×2): qty 1

## 2012-03-19 MED ORDER — ZINC SULFATE 220 (50 ZN) MG PO CAPS
220.0000 mg | ORAL_CAPSULE | Freq: Every day | ORAL | Status: DC
  Administered 2012-03-20: 220 mg via ORAL
  Filled 2012-03-19: qty 1

## 2012-03-19 MED ORDER — FENTANYL 25 MCG/HR TD PT72
100.0000 ug | MEDICATED_PATCH | TRANSDERMAL | Status: DC
  Administered 2012-03-19: 100 ug via TRANSDERMAL
  Filled 2012-03-19: qty 1

## 2012-03-19 MED ORDER — SODIUM CHLORIDE 0.9 % IV SOLN: 250.0000 mL | INTRAVENOUS | Status: DC | PRN

## 2012-03-19 MED ORDER — METOPROLOL TARTRATE 50 MG PO TABS
50.0000 mg | ORAL_TABLET | Freq: Every day | ORAL | Status: DC
  Administered 2012-03-19: 50 mg via ORAL
  Filled 2012-03-19 (×2): qty 1

## 2012-03-19 MED ORDER — ACETAMINOPHEN 500 MG PO TABS
500.0000 mg | ORAL_TABLET | Freq: Four times a day (QID) | ORAL | Status: DC | PRN
  Filled 2012-03-19: qty 1

## 2012-03-19 MED ORDER — PREDNISONE 5 MG PO TABS
5.0000 mg | ORAL_TABLET | Freq: Every day | ORAL | Status: DC
  Administered 2012-03-19 – 2012-03-20 (×2): 5 mg via ORAL
  Filled 2012-03-19 (×2): qty 1

## 2012-03-19 MED ORDER — CITALOPRAM HYDROBROMIDE 20 MG PO TABS
20.0000 mg | ORAL_TABLET | Freq: Every day | ORAL | Status: DC
  Administered 2012-03-19 – 2012-03-20 (×2): 20 mg via ORAL
  Filled 2012-03-19 (×2): qty 1

## 2012-03-19 MED ORDER — CO Q10 100 MG PO TABS: 100.0000 mg | ORAL_TABLET | Freq: Every day | ORAL | Status: DC

## 2012-03-19 MED ORDER — FOLIC ACID 400 MCG PO TABS: 400.0000 ug | ORAL_TABLET | Freq: Every day | ORAL | Status: DC

## 2012-03-19 MED ORDER — OMEGA-3-ACID ETHYL ESTERS 1 G PO CAPS
1.0000 g | ORAL_CAPSULE | Freq: Every day | ORAL | Status: DC
  Administered 2012-03-19: 1 g via ORAL
  Filled 2012-03-19 (×2): qty 1

## 2012-03-19 MED ORDER — POTASSIUM CHLORIDE ER 10 MEQ PO TBCR
10.0000 meq | EXTENDED_RELEASE_TABLET | Freq: Every day | ORAL | Status: DC
  Administered 2012-03-19 – 2012-03-20 (×2): 10 meq via ORAL
  Filled 2012-03-19 (×3): qty 1

## 2012-03-19 MED ORDER — NITROGLYCERIN 0.4 MG SL SUBL: 0.4000 mg | SUBLINGUAL_TABLET | SUBLINGUAL | Status: DC | PRN

## 2012-03-19 MED ORDER — OCUVITE PO TABS
1.0000 | ORAL_TABLET | Freq: Every day | ORAL | Status: DC
  Administered 2012-03-19: 1 via ORAL
  Filled 2012-03-19 (×2): qty 1

## 2012-03-19 MED ORDER — LEVOTHYROXINE SODIUM 25 MCG PO TABS
25.0000 ug | ORAL_TABLET | Freq: Every day | ORAL | Status: DC
  Administered 2012-03-20: 25 ug via ORAL
  Filled 2012-03-19: qty 1

## 2012-03-19 MED ORDER — FERROUS SULFATE 325 (65 FE) MG PO TABS
325.0000 mg | ORAL_TABLET | Freq: Every day | ORAL | Status: DC
  Administered 2012-03-20: 325 mg via ORAL
  Filled 2012-03-19 (×2): qty 1

## 2012-03-19 MED ORDER — ADULT MULTIVITAMIN W/MINERALS CH
1.0000 | ORAL_TABLET | Freq: Every day | ORAL | Status: DC
  Administered 2012-03-19 – 2012-03-20 (×2): 1 via ORAL
  Filled 2012-03-19 (×2): qty 1

## 2012-03-19 MED ORDER — PANTOPRAZOLE SODIUM 40 MG PO TBEC
40.0000 mg | DELAYED_RELEASE_TABLET | Freq: Every day | ORAL | Status: DC
  Administered 2012-03-19 – 2012-03-20 (×2): 40 mg via ORAL
  Filled 2012-03-19 (×2): qty 1

## 2012-03-19 NOTE — ED Notes (Signed)
Med held d/t low bp, EDP aware.

## 2012-03-19 NOTE — H&P (Signed)
History and Physical  Patient ID: Nicole Roberts MRN: [id number], DOB: 1921/11/27 Date of Encounter: 03/19/2012, 12:39 PM Primary Physician: [name], MD Primary Cardiologist: Allred  Chief Complaint: chest pain  HPI: Nicole Roberts is an 78 y/o F with chronic lymphedema, atrial fib with tachybrady syndrome s/p PPM, very remote reported MI in setting of afib-RVR with negative nuclear study 2011 who presented to [facility] with complaints of chest pain today. She also has chronic anemia, macrocytic with normal B12/folate and 3/6 hemoccult + cards 10/2011. (Per PCP note 12/2011, colonoscopy was deferred as risks were felt to outweigh benefits.) She awoke at 5am today with substernal chest pain that radiated down her arm and into her back, associated with diaphoresis and nausea.  She got up, took her morning lasix and levothyroxine, and checked her BP which ~160/100 with pulse P115. She was urged by family to call 911 but wanted to make sure her husband was taken care of so she did not call EMS until around 7:30am. She believes she had constant pain for 2.5 hours, but thinks it had subsided on its own. She was given 4 baby ASA by EMS. By the time she got to the hospital, the pain was gone. It has not returned. Her symptoms reminded her of those that led her to have the stress test in 2011. She denies any CP with exertion, but has noted more SOB over the last few months with housework. No syncope, palpitations, orthopnea or PND. Has chronic unchanged LEE. No BRBPR, hematemesis, melena, or hematuria. Labs significant for BUN/Cr 24/1.12, pBNP 4164, neg troponin x 1, Hgb 10.5/plt 122 (in line with prior values over the last year). CXR: cardiomegaly with mild vascular congestion; no CHF effusion or pneumonia.  Past Medical History  Diagnosis Date  . HEARING LOSS   . MITRAL VALVE INSUFF&AORTIC VALVE INSUFF     moderate MR  . PULMONARY HYPERTENSION   . Atrial fibrillation     permanent  . SICK  SINUS SYNDROME     a. Tachybrady syndrome - Guidant PPM 10/2005.  . Edema 03/19/2009    due to venous insufficiency, chronic lymphedema  . URINARY INCONTINENCE   . Rheumatoid arthritis dx clarified 2011    On prednisone  . BREAST CANCER 12/2006    ductal ca s/p L lumpectomy  . Diastolic dysfunction   . HYPERTENSION   . HYPERLIPIDEMIA   . GERD   . HYPOTHYROIDISM   . Hyperparathyroidism     2 lobes removed  . Venous insufficiency     chronic BLE edema  . Spinal stenosis   . Anemia     a. Macrocytic - normal B12, folate 08/2011. b. 3/6 heme positive stools 10/2011 - per PCP note, elected against colonscopy.  [name] History of colonoscopy 2009  . TIA (transient ischemic attack) 05/2011  . Weight loss   . Peptic ulcer   . Heart attack     a. Reported light heart attacks 1978, 1990 in setting of AF-RVR, suspected demand ischemia.   . Renal insufficiency   . Breast cancer      Most Recent Cardiac Studies: Nuclear Stress Test 10/2009 Exercise Capacity: Lexiscan  BP Response: Normal blood pressure response.  Clinical Symptoms: Woozy and fatigue  ECG Impression: No significant ST segment change suggestive of ischemia.  Overall Impression: Normal stress nuclear study.  Echo 05/2009 Study Conclusions - Left ventricle: The cavity size was normal. Wall thickness was normal. Systolic function was normal. The estimated ejection fraction  was in the range of 60% to 65%. - Mitral valve: Mild regurgitation. - Tricuspid valve: Moderate regurgitation. - Pulmonary arteries: PA peak pressure: 46mm Hg (S).   Surgical History:  Past Surgical History  Procedure Date  . Pacemaker placement 2005    SSS and syncope  . Cholecystectomy 04/06/99  . Abdominal hysterectomy 1970    Partial  . Left hip arthroscopy 1995  . Right hip arthroscopy 01/2001  . Carpal tunnel release 1998    bilateral  . Breast surgery 12/2006    Left breast lupectomy- Ductal CA  . Left parathyroidectomy 07/2008     Home  Meds: Prior to Admission medications   Medication Sig Start Date End Date Taking? Authorizing Provider  acetaminophen (TYLENOL) 500 MG tablet Take 500 mg by mouth every 6 (six) hours as needed. For pain   Yes Historical Provider, MD  atorvastatin (LIPITOR) 10 MG tablet Take 10 mg by mouth at bedtime.   Yes Historical Provider, MD  beta carotene w/minerals (OCUVITE) tablet Take 1 tablet by mouth daily.   Yes Historical Provider, MD  Cholecalciferol (VITAMIN D3) 1000 UNITS CAPS Take 1,000 Units by mouth daily.    Yes Historical Provider, MD  citalopram (CELEXA) 20 MG tablet Take 1 tablet (20 mg total) by mouth daily. 05/11/11  Yes Newt Lukes, MD  Coenzyme Q10 (CO Q10) 100 MG TABS Take 100 mg by mouth at bedtime.    Yes Historical Provider, MD  cyanocobalamin (CYANOCOBALAMIN) 500 MCG tablet Take 1 tablet (500 mcg total) by mouth daily. 09/01/10  Yes Newt Lukes, MD  dabigatran (PRADAXA) 75 MG CAPS Take 1 capsule (75 mg total) by mouth every 12 (twelve) hours. 02/01/12  Yes Hillis Range, MD  fentaNYL (DURAGESIC - DOSED MCG/HR) 100 MCG/HR Place 1 patch onto the skin every 3 (three) days.   Yes Historical Provider, MD  ferrous sulfate 325 (65 FE) MG tablet Take 325 mg by mouth daily with breakfast.     Yes Historical Provider, MD  folic acid (FOLVITE) 400 MCG tablet Take 400 mcg by mouth daily.   Yes Historical Provider, MD  furosemide (LASIX) 20 MG tablet Take 1-1.5 tablets (20-30 mg total) by mouth daily. 09/07/11  Yes Newt Lukes, MD  Lecithin 400 MG CAPS Take 400 mg by mouth daily.    Yes Historical Provider, MD  leflunomide (ARAVA) 20 MG tablet Take 20 mg by mouth daily.   Yes Historical Provider, MD  levothyroxine (SYNTHROID, LEVOTHROID) 25 MCG tablet Take 25 mcg by mouth daily.   Yes Historical Provider, MD  lisinopril (PRINIVIL,ZESTRIL) 20 MG tablet Take 1 tablet (20 mg total) by mouth daily. 02/01/12  Yes Hillis Range, MD  metoprolol (LOPRESSOR) 100 MG tablet Take 50-100 mg by  mouth 2 (two) times daily. Take 1 tab in the morning and 1/2 tab in the evening   Yes Historical Provider, MD  Multiple Vitamin (MULTIVITAMIN) tablet Take 1 tablet by mouth daily.   Yes Historical Provider, MD  Omega-3 Fatty Acids (FISH OIL) 1000 MG CPDR Take 1,000 mg by mouth at bedtime.    Yes Historical Provider, MD  ondansetron (ZOFRAN) 4 MG tablet Take 4 mg by mouth every 8 (eight) hours as needed. For nausea   Yes Historical Provider, MD  pantoprazole (PROTONIX) 40 MG tablet Take 1 tablet (40 mg total) by mouth daily. 11/10/11  Yes Newt Lukes, MD  potassium chloride (K-DUR) 10 MEQ tablet Take 1 tablet (10 mEq total) by mouth daily. 11/10/11  Yes Newt Lukes, MD  predniSONE (DELTASONE) 5 MG tablet Take 5 mg by mouth daily.   Yes Historical Provider, MD  zinc sulfate 220 MG capsule Take 220 mg by mouth daily.     Yes Historical Provider, MD    Allergies:  Allergies  Allergen Reactions  . Sulfa Antibiotics Itching  . Tramadol Hcl Itching and Rash    History   Social History  . Marital Status: Married    Spouse Name: N/A    Number of Children: 6  . Years of Education: N/A   Occupational History  . Retired Futures trader    Social History Main Topics  . Smoking status: Never Smoker   . Smokeless tobacco: Never Used     Comment: Lives at [facility] since 03/2009- married, lives with spouse. Moved here from [facility] to be near son  . Alcohol Use: No  . Drug Use: No  . Sexually Active: Not Currently   Other Topics Concern  . Not on file   Social History Narrative  . No narrative on file     Family History  Problem Relation Age of Onset  . Ovarian cancer Mother   . Coronary artery disease Sister   . Coronary artery disease Brother   . Hypertension Mother     grandparent  . Lung cancer Father     Review of Systems: General: negative for chills, fever, night sweats. She was losing weight earlier this year but has since gained it  back Cardiovascular: see above Dermatological: negative for rash Respiratory: negative for cough or wheezing Urologic: negative for hematuria Abdominal: negative for vomiting, diarrhea, bright red blood per rectum, melena, or hematemesis Neurologic: negative for visual changes, syncope, or dizziness All other systems reviewed and are otherwise negative except as noted above.  Labs:   Lab Results  Component Value Date   WBC 6.0 03/19/2012   HGB 10.5* 03/19/2012   HCT 31.5* 03/19/2012   MCV 107.9* 03/19/2012   PLT 122* 03/19/2012     Lab 03/19/12 0950  NA 139  K 3.8  CL 103  CO2 24  BUN 24*  CREATININE 1.12*  CALCIUM 9.0  PROT --  BILITOT --  ALKPHOS --  ALT --  AST --  GLUCOSE 90    Basename 03/19/12 1009  CKTOTAL --  CKMB --  TROPONINI <0.30   Lab Results  Component Value Date   CHOL 146 03/14/2012   HDL 61.90 03/14/2012   LDLCALC 63 03/14/2012   TRIG 108.0 03/14/2012   Radiology/Studies:  Dg Chest 2 View 03/19/2012  *RADIOLOGY REPORT*  Clinical Data: Left chest pain, pacemaker, hypertension  CHEST - 2 VIEW  Comparison: 06/03/2011, 11/22/2009  Findings: Stable mild cardiac enlargement and vascular prominence. Negative for CHF, pneumonia, effusion, pneumothorax.  Trachea is midline.  Left subclavian two lead pacer noted.  Degenerative changes of the spine.  IMPRESSION: Cardiomegaly with mild vascular congestion.  No interval change or acute process.   Original Report Authenticated By: Judie Petit. Shick, M.D.     EKG: atrial fib 91bpm with occasionally abberrant conducted beats, no acute ST-T changes  Physical Exam: Blood pressure 114/60, pulse 81, temperature 98.6 F (37 C), temperature source Oral, resp. rate 20, SpO2 98.00%. General: Well developed, well nourished elderly WF in no acute distress. Head: Normocephalic, atraumatic, sclera non-icteric, no xanthomas, nares are without discharge.  Neck: JVD not elevated. Supple. Lungs: Clear bilaterally to auscultation  without wheezes, rales, or rhonchi. Breathing is unlabored. Heart: Irregularly irregular  with S1 S2. No murmurs, rubs, or gallops appreciated. Pacemaker in situ left upper chest Abdomen: Soft, non-tender, non-distended with normoactive bowel sounds. No hepatomegaly. No rebound/guarding. No obvious abdominal masses. Msk:  Strength and tone appear normal for age. Extremities: No clubbing or cyanosis. 2+ warm bilateral pitting edema.  Distal pedal pulses are 2+ and equal bilaterally. Neuro: Alert and oriented X 3. No focal deficit. No facial asymmetry. Moves all extremities spontaneously. Psych:  Responds to questions appropriately with a normal affect.   ASSESSMENT AND PLAN:   1. Chest pain concerning for [place] (no documented CAD but reported remote MIs) 2. Atrial fib with tachybrady syndrome s/p PPM, on Pradaxa 3. Chronic anemia with heme positive stools 10/211, PCP decided against colonscopy 12/2011 4. Chronic renal insufficiency stage III 5. Chronic lymphedema 6. Rheumatoid arthritis, on chronic prednisone/fentanyl patch  Discussed with Dr. Elease Hashimoto. Will admit and cycle cardiac enzymes. Add low dose ASA in AM if enzymes turn positive (received 324mg  today). With history of heme positive stools/anemia, CKD, and advanced age, she is not the ideal candidate for cath. Hgb & Cr are stable. pBNP is elevated but aside from chronic lymphedema, patient does not appear volume overloaded. Continue Pradaxa for now. If enzymes remain negative, can consider stress testing with possible addition of Imdur for med rx. See below for additional thoughts.  Signed, Ronie Spies PA-C 03/19/2012, 12:39 PM  Attending Note:   The patient was seen and examined.  Agree with assessment and plan as noted above.  Pt is a very nice lady admitted with 3 hours of angina like CP (chest pressure, radiation to left arm and across back, associated with diaphoresis). He symptoms gradually resolved. ECG is unremarkable.  She has  chronic AF and a TIA  and is on low dose Pradaxa.  She had had anemia and has had a slow GI bleed.  Her medical doctor thought that the risk of colonoscopy outweighed the benefit and so she has not had any further workup .  She has continued her Pradaxa.   I agree that we should keep her overnight.  If her enzymes are negative, we can DC her to home tomorrow.  Dr. Johney Frame will see her tomorrow.    Vesta Mixer, Montez Hageman., MD, [facility] 03/19/2012, 1:51 PM

## 2012-03-19 NOTE — ED Notes (Signed)
Nurse unable to take report at the time. To call back.  

## 2012-03-19 NOTE — ED Notes (Signed)
Woke at 5 am w/ cp rads to left arm and back had some sweating and was nauseous no vomiting  Was given 324 asa Iv 20 left hand  Has a pacemaker no pain now no nitro

## 2012-03-19 NOTE — ED Provider Notes (Addendum)
History     CSN: [id number]  Arrival date & time 03/19/12  0845   First MD Initiated Contact with Patient 03/19/12 0912      No chief complaint on file.   (Consider location/radiation/quality/duration/timing/severity/associated sxs/prior treatment) HPI Comments: Pt with hx of afib on pradaxa, MI in 1979, no coronary interventions, diabetes, CHF, HTN comes in with cc of chest pain. Pt reports waking up at 5 am due to chest pain that was significant, sharp pain located on the left side of her upper chest, back and shoulder. No specific precipitating, aggravating or relieving factors. The pain started subsiding after several minutes, and is pain free at my evaluation. There was associated nausea, diaphoresis and lightheadedness. No palpitations, mild SOB. No new infections, fevers. No recent cardiac workup, and no similar symptoms in the recent hx. Pt has pitting edema, but it has improved over the course of the past few days.No orthopnea or PND.  The history is provided by the patient and medical records.    Past Medical History  Diagnosis Date  . HEARING LOSS   . MITRAL VALVE INSUFF&AORTIC VALVE INSUFF     moderate MR  . PULMONARY HYPERTENSION   . Atrial fibrillation     permanent  . SICK SINUS SYNDROME     PPM 2005 for syncope  . Edema 03/19/2009    due to venous insufficiency  . URINARY INCONTINENCE   . Rheumatoid arthritis dx clarified 2011  . BREAST CANCER 12/2006    ductal ca s/p L lumpectomy  . Diastolic dysfunction   . HYPERTENSION   . HYPERLIPIDEMIA   . GERD   . HYPOTHYROIDISM   . Hyperparathyroidism     2 lobes removed  . Venous insufficiency     chronic BLE edema  . Spinal stenosis   . Anemia   . History of colonoscopy 2009  . TIA (transient ischemic attack) 05/2011  . Weight loss   . Peptic ulcer     Past Surgical History  Procedure Date  . Pacemaker placement 2005    SSS and syncope  . Cholecystectomy 04/06/99  . Abdominal hysterectomy 1970   Partial  . Left hip arthroscopy 1995  . Right hip arthroscopy 01/2001  . Carpal tunnel release 1998    bilateral  . Breast surgery 12/2006    Left breast lupectomy- Ductal CA  . Left parathyroidectomy 07/2008    Family History  Problem Relation Age of Onset  . Ovarian cancer Mother   . Coronary artery disease Sister   . Coronary artery disease Brother   . Hypertension Mother     grandparent  . Lung cancer Father     History  Substance Use Topics  . Smoking status: Never Smoker   . Smokeless tobacco: Never Used     Comment: Lives at [facility] since 03/2009- married, lives with spouse. Moved here from [facility] to be near son  . Alcohol Use: No    OB History    Grav Para Term Preterm Abortions TAB SAB Ect Mult Living                  Review of Systems  Constitutional: Negative for activity change.  HENT: Negative for facial swelling and neck pain.   Respiratory: Positive for shortness of breath. Negative for cough and wheezing.   Cardiovascular: Positive for chest pain and leg swelling.  Gastrointestinal: Negative for nausea, vomiting, abdominal pain, diarrhea, constipation, blood in stool and abdominal distention.  Genitourinary: Negative for  hematuria and difficulty urinating.  Musculoskeletal: Positive for back pain.  Skin: Negative for color change.  Neurological: Negative for speech difficulty.  Hematological: Does not bruise/bleed easily.  Psychiatric/Behavioral: Negative for confusion.    Allergies  Sulfa antibiotics and Tramadol hcl  Home Medications   Current Outpatient Rx  Name  Route  Sig  Dispense  Refill  . ACETAMINOPHEN 500 MG PO TABS   Oral   Take 500 mg by mouth every 6 (six) hours as needed. For pain         . ATORVASTATIN CALCIUM 10 MG PO TABS   Oral   Take 10 mg by mouth at bedtime.         . OCUVITE PO TABS   Oral   Take 1 tablet by mouth daily.         [name] VITAMIN D3 1000 UNITS PO CAPS   Oral   Take 1,000 Units by  mouth daily.          [name] CITALOPRAM HYDROBROMIDE 20 MG PO TABS   Oral   Take 1 tablet (20 mg total) by mouth daily.   90 tablet   1   . CO Q10 100 MG PO TABS   Oral   Take 100 mg by mouth at bedtime.          [name] VITAMIN B-12 500 MCG PO TABS   Oral   Take 1 tablet (500 mcg total) by mouth daily.   90 tablet   3   . DABIGATRAN ETEXILATE MESYLATE 75 MG PO CAPS   Oral   Take 1 capsule (75 mg total) by mouth every 12 (twelve) hours.   180 capsule   3   . FENTANYL 100 MCG/HR TD PT72   Transdermal   Place 1 patch onto the skin every 3 (three) days.         Di Kindle SULFATE 325 (65 FE) MG PO TABS   Oral   Take 325 mg by mouth daily with breakfast.           . FOLIC ACID 400 MCG PO TABS   Oral   Take 400 mcg by mouth daily.         . FUROSEMIDE 20 MG PO TABS   Oral   Take 1-1.5 tablets (20-30 mg total) by mouth daily.   90 tablet   1   . LECITHIN 400 MG PO CAPS   Oral   Take 400 mg by mouth daily.          [name] LEFLUNOMIDE 20 MG PO TABS   Oral   Take 20 mg by mouth daily.         [name] LEVOTHYROXINE SODIUM 25 MCG PO TABS   Oral   Take 25 mcg by mouth daily.         [name] LISINOPRIL 20 MG PO TABS   Oral   Take 1 tablet (20 mg total) by mouth daily.   180 tablet   1   . METOPROLOL TARTRATE 100 MG PO TABS   Oral   Take 50-100 mg by mouth 2 (two) times daily. Take 1 tab in the morning and 1/2 tab in the evening         . ONE-DAILY MULTI VITAMINS PO TABS   Oral   Take 1 tablet by mouth daily.         [name] FISH OIL 1000 MG PO CPDR   Oral   Take 1,000 mg by mouth at  bedtime.          [name] ONDANSETRON HCL 4 MG PO TABS   Oral   Take 4 mg by mouth every 8 (eight) hours as needed. For nausea         . PANTOPRAZOLE SODIUM 40 MG PO TBEC   Oral   Take 1 tablet (40 mg total) by mouth daily.   90 tablet   1   . POTASSIUM CHLORIDE ER 10 MEQ PO TBCR   Oral   Take 1 tablet (10 mEq total) by mouth daily.   90 tablet   1   . PREDNISONE 5 MG PO TABS    Oral   Take 5 mg by mouth daily.         [name] ZINC SULFATE 220 MG PO CAPS   Oral   Take 220 mg by mouth daily.             BP 143/54  Pulse 87  Temp 98.6 F (37 C) (Oral)  Resp 14  SpO2 98%  Physical Exam  Nursing note and vitals reviewed. Constitutional: She is oriented to person, place, and time. She appears well-developed and well-nourished.  HENT:  Head: Normocephalic and atraumatic.  Eyes: EOM are normal. Pupils are equal, round, and reactive to light.  Neck: Neck supple. JVD present.  Cardiovascular: Normal rate.   Murmur heard. Pulmonary/Chest: Effort normal. No respiratory distress. She has no rales.  Abdominal: Soft. She exhibits no distension. There is no tenderness. There is no rebound and no guarding.  Musculoskeletal: She exhibits edema.       2+ pitting edema, extending upto the proximal tibia - equal bilaterally  Neurological: She is alert and oriented to person, place, and time.  Skin: Skin is warm and dry.    ED Course  Procedures (including critical care time)   Labs Reviewed  CBC WITH DIFFERENTIAL  BASIC METABOLIC PANEL  APTT  PROTIME-INR  TROPONIN I  URINALYSIS, ROUTINE W REFLEX MICROSCOPIC  PRO B NATRIURETIC PEPTIDE  TROPONIN I   No results found.   No diagnosis found.    MDM   Date: 03/19/2012  Rate: 91  Rhythm: atrial fibrillation and premature ventricular contractions (PVC)  QRS Axis: normal  Intervals: normal  ST/T Wave abnormalities: nonspecific ST changes  Conduction Disutrbances:none  Narrative Interpretation:   Old EKG Reviewed: unchanged There is mild ST elevation - about .5-21mm on the anterior leads.  Differential diagnosis includes: ACS syndrome CHF exacerbation Valvular disorder Myocarditis Pericarditis Pericardial effusion Pneumonia Pleural effusion Pulmonary edema PE Anemia Musculoskeletal pain  Pt comes in with cc of left sided chest pain with associated typical constitutionals. Pain free at my  evaluation, with exam revealing a murmur, irregular heard beat, mild jvd and 2+ pitting edema bilaterally.  Concerns for ACS syndrome at this time, and there might be an element of worsening right sided failure 2/2 her pulm HTN. Will consult Cardiology. Likely admission.   Derwood Kaplan, MD 03/19/12 1050  12:07 PM BNP is elevated over 4000, new. With worsening pitting edema, likelt CHF exacerbation, and needs further cardiac risk stratification. Pt given home dose lasix at arrival, no need for acute CHF management, so will not give further meds. Cards consulted.  Derwood Kaplan, MD 03/19/12 1208

## 2012-03-19 NOTE — Progress Notes (Signed)
Pt arrived to floor from ED A&0x#.  Oriented to room.  Call bell agt bedside.  Will continue to monitor.  Amanda Pea, Charity fundraiser.

## 2012-03-19 NOTE — ED Notes (Signed)
Re-paged [facility] to [phone]

## 2012-03-19 NOTE — Progress Notes (Signed)
PHARMACIST - PHYSICIAN ORDER COMMUNICATION  CONCERNING: P&T Medication Policy on Herbal Medications  DESCRIPTION:  This patient's order for:  Coenzyme Q  has been noted.  This product(s) is classified as an "herbal" or natural product. Due to a lack of definitive safety studies or FDA approval, nonstandard manufacturing practices, plus the potential risk of unknown drug-drug interactions while on inpatient medications, the Pharmacy and Therapeutics Committee does not permit the use of "herbal" or natural products of this type within [place].   ACTION TAKEN: The pharmacy department is unable to verify this order at this time and your patient has been informed of this safety policy. Please reevaluate patient's clinical condition at discharge and address if the herbal or natural product(s) should be resumed at that time.  Press Casale, PharmD, BCPS Clinical Pharmacist Pager [phone]  

## 2012-03-20 DIAGNOSIS — I495 Sick sinus syndrome: Secondary | ICD-10-CM

## 2012-03-20 DIAGNOSIS — R0789 Other chest pain: Secondary | ICD-10-CM

## 2012-03-20 DIAGNOSIS — D539 Nutritional anemia, unspecified: Secondary | ICD-10-CM

## 2012-03-20 LAB — BASIC METABOLIC PANEL
GFR calc Af Amer: 53 mL/min — ABNORMAL LOW (ref >90)
GFR calc non Af Amer: 46 mL/min — ABNORMAL LOW (ref >90)
Glucose, Bld: 99 mg/dL (ref 70–99)
Potassium: 4.2 mEq/L (ref 3.5–5.1)
Sodium: 142 mEq/L (ref 135–145)

## 2012-03-20 LAB — LIPID PANEL
Cholesterol: 143 mg/dL (ref 0–200)
Total CHOL/HDL Ratio: 2 RATIO
Triglycerides: 77 mg/dL (ref <150)
VLDL: 15 mg/dL (ref 0–40)

## 2012-03-20 LAB — CBC
Hemoglobin: 10.3 g/dL — ABNORMAL LOW (ref 12.0–15.0)
RBC: 2.83 MIL/uL — ABNORMAL LOW (ref 3.87–5.11)

## 2012-03-20 MED ORDER — FENTANYL 75 MCG/HR TD PT72
75.0000 ug | MEDICATED_PATCH | TRANSDERMAL | Status: DC
  Administered 2012-03-20: 75 ug via TRANSDERMAL
  Filled 2012-03-20: qty 1

## 2012-03-20 MED ORDER — ISOSORBIDE MONONITRATE 15 MG HALF TABLET
15.0000 mg | ORAL_TABLET | Freq: Every day | ORAL | Status: DC
  Administered 2012-03-20: 15 mg via ORAL
  Filled 2012-03-20: qty 1

## 2012-03-20 MED ORDER — NITROGLYCERIN 0.4 MG SL SUBL: 0.4000 mg | SUBLINGUAL_TABLET | SUBLINGUAL | Status: DC | PRN

## 2012-03-20 MED ORDER — ISOSORBIDE MONONITRATE 15 MG HALF TABLET: 15.0000 mg | ORAL_TABLET | Freq: Every day | ORAL | Status: DC

## 2012-03-20 MED ORDER — FENTANYL 75 MCG/HR TD PT72: 100.0000 ug | MEDICATED_PATCH | TRANSDERMAL | Status: DC

## 2012-03-20 NOTE — Progress Notes (Signed)
Pt resting in bed, family at bedside.  No distress noted, denies pain.

## 2012-03-20 NOTE — Discharge Summary (Signed)
Discharge Summary   Patient ID: Nicole Roberts,  MRN: [id number], DOB/AGE: Feb 28, 1922 78 y.o.  Admit date: 03/19/2012 Discharge date: 03/20/2012  Primary Physician: [name], MD Primary Cardiologist: Hillis Range, MD (EP)  Discharge Diagnoses Principal Problem:  *CHEST PAIN, ATYPICAL Active Problems:  HYPOTHYROIDISM  HYPERLIPIDEMIA  HYPERTENSION  GERD  PACEMAKER-[employer]  Atrial fibrillation  Tachy-brady syndrome  Anemia, macrocytic  CKD (chronic kidney disease), stage III   Allergies Allergies  Allergen Reactions  . Sulfa Antibiotics Itching  . Tramadol Hcl Itching and Rash    Diagnostic Studies/Procedures  PA/LATERAL CHEST X-RAY - 03/19/12  Comparison: 06/03/2011, 11/22/2009  Findings: Stable mild cardiac enlargement and vascular prominence.  Negative for CHF, pneumonia, effusion, pneumothorax. Trachea is  midline. Left subclavian two lead pacer noted. Degenerative  changes of the spine.  IMPRESSION:  Cardiomegaly with mild vascular congestion. No interval change or  acute process.   History of Present Illness  Nicole Roberts is a 78yo female observed overnight at [facility] on 11/18-11/19/2013 with the above problem list.   She has a history of chronic lymphedema (bilateral LE edema), a-fib w/ tachy-brady syndrome s/p PPM, remote MI in the setting of a-fib w/ RVR (negative nuc stress test 2011), macrocytic anemia (nl B12, folate, + hemoccult 06/2011, c-scopy deferred by PCP d/t risks>benefits) and CKD (stage III).   She awoke the day of admission at 5 AM with substernal cp radiating down her left arm and back w/ assoc diaphoresis and nausea. The pain persisted for ~ 2.5 hours, before gradually remitting. No exertional component. She did note some worsening DOE with housework. No syncope, orthopnea, PND or palpitations. She noted chronic LE edema bilaterally. No active bleeding. This was reminiscent of her cp leading to stress testing  in 2011. She was urged to present to the ED by her family, and upon arriving, she was pain free. She received a full-dose ASA en route by EMS.   There, EKG revealed no acute ischemia and initial trop-I returned WNL. H/H stable. CXR as above revealed no acute abnormalities. pBNP was elevated at 4164, but she was deemed to be euvolemic on exam (LE edema chronic 2/2 lymphedema). Given that the HPI was concerning for [place], and question of prior MI/CAD, she was observed overnight for formal rule-out.   Hospital Course   She was continued on outpatient medications. Low-dose ASA was added. There were no overnight events. Multiple subsequent trops-I returned WNL, and she effectively ruled out. She was assessed by Dr. Johney Frame today, and given her underlying comorbidities, age and CKD, he has elected to manage conservatively. She was deemed stable for discharge. Low-dose ASA will be discontinued given concomitant Pradaxa use and h/o GIB. She will follow-up in the office as noted below, and to undergo an outpatient echo. If she experiences no further cp, conservative management is favored. If cp recurs, outpatient Myoview has been recommended. Low-dose Imdur and NTG SL PRN will be added for anti-anginal benefit. She ambulated well with cardiac rehab prior to discharge. Did complain of hip, but no chest pain. Patient's son had questions regarding care and further eval/management going forward. Questions answered, and all are in agreement of plan. This information has been clearly outlined in the discharge AVS.   Discharge Vitals:  Blood pressure 142/88, pulse 84, temperature 98.2 F (36.8 C), temperature source Oral, resp. rate 18, height 5\' 4"  (1.626 m), weight 72.7 kg (160 lb 4.4 oz), SpO2 96.00%.   Labs: Recent Labs  [facility] 03/20/12 4098 03/19/12  0950   WBC 4.4 6.0   HGB 10.3* 10.5*   HCT 30.9* 31.5*   MCV 109.2* 107.9*   PLT 116* 122*    Lab 03/20/12 0705 03/19/12 1903 03/19/12 0950  NA 142 -- 139  K  4.2 -- 3.8  CL 105 -- 103  CO2 28 -- 24  BUN 21 -- 24*  CREATININE 1.05 -- 1.12*  CALCIUM 9.1 -- 9.0  PROT -- 5.8* --  BILITOT -- 0.5 --  ALKPHOS -- 40 --  ALT -- 22 --  AST -- 26 --  AMYLASE -- -- --  LIPASE -- -- --  GLUCOSE 99 -- 90    Recent Labs  Basename 03/20/12 0705 03/20/12 0149 03/19/12 1903   CKTOTAL -- -- --   CKMB -- -- --   CKMBINDEX -- -- --   TROPONINI <0.30 <0.30 <0.30    Recent Labs  Basename 03/20/12 0705   CHOL 143   HDL 70   LDLCALC 58   TRIG 77   CHOLHDL 2.0   LDLDIRECT --    Disposition:  Discharge Orders    Future Appointments: Provider: Department: Dept Phone: Center:   04/03/2012 1:00 PM [facility] [address] ECHO LAB [phone] None   04/03/2012 2:00 PM Rosalio Macadamia, NP [facility] Main Office [place]) [phone] [facility]     Follow-up Information    Follow up with Norma Fredrickson, NP. On 04/03/2012. (At 1:00 PM for echocardiogram, then 2:00 PM for follow-up appointment. )    Contact information:   [address] [place] [address] [phone]         Discharge Medications:    Medication List     As of 03/20/2012  1:22 PM    START taking these medications         isosorbide mononitrate 15 mg Tb24   Commonly known as: IMDUR   Take 0.5 tablets (15 mg total) by mouth daily.      nitroGLYCERIN 0.4 MG SL tablet   Commonly known as: NITROSTAT   Place 1 tablet (0.4 mg total) under the tongue every 5 (five) minutes x 3 doses as needed for chest pain.      CONTINUE taking these medications         atorvastatin 10 MG tablet   Commonly known as: LIPITOR      beta carotene w/minerals tablet      citalopram 20 MG tablet   Commonly known as: CELEXA   Take 1 tablet (20 mg total) by mouth daily.      Co Q10 100 MG Tabs      dabigatran 75 MG Caps   Commonly known as: PRADAXA   Take 1 capsule (75 mg total) by mouth every 12 (twelve) hours.      fentaNYL 100 MCG/HR    Commonly known as: DURAGESIC - dosed mcg/hr      ferrous sulfate 325 (65 FE) MG tablet      Fish Oil 1000 MG Cpdr      folic acid 400 MCG tablet   Commonly known as: FOLVITE      furosemide 20 MG tablet   Commonly known as: LASIX      Lecithin 400 MG Caps      leflunomide 20 MG tablet   Commonly known as: ARAVA      levothyroxine 25 MCG tablet   Commonly known as: SYNTHROID, LEVOTHROID      lisinopril 20 MG  tablet   Commonly known as: PRINIVIL,ZESTRIL   Take 1 tablet (20 mg total) by mouth daily.      metoprolol 100 MG tablet   Commonly known as: LOPRESSOR      multivitamin tablet      ondansetron 4 MG tablet   Commonly known as: ZOFRAN      pantoprazole 40 MG tablet   Commonly known as: PROTONIX   Take 1 tablet (40 mg total) by mouth daily.      potassium chloride 10 MEQ tablet   Commonly known as: K-DUR   Take 1 tablet (10 mEq total) by mouth daily.      predniSONE 5 MG tablet   Commonly known as: DELTASONE      TYLENOL 500 MG tablet   Generic drug: acetaminophen      vitamin B-12 500 MCG tablet   Commonly known as: CYANOCOBALAMIN   Take 1 tablet (500 mcg total) by mouth daily.      Vitamin D3 1000 UNITS Caps      zinc sulfate 220 MG capsule          Where to get your medications    These are the prescriptions that you need to pick up. We sent them to a specific pharmacy, so you will need to go there to get them.   [id number] [place], [place] - [address]    [address] [place] [address]    Phone: [phone]        isosorbide mononitrate 15 mg Tb24   nitroGLYCERIN 0.4 MG SL tablet           Outstanding Labs/Studies: echocardiogram 04/03/12  Duration of Discharge Encounter: Greater than 30 minutes including physician time.  Signed, R. Hurman Horn, PA-C 03/20/2012, 1:22 PM   Hillis Range, MD

## 2012-03-20 NOTE — Progress Notes (Signed)
All d/c instructions explained and given to pt and her son.  Verbalized understanding.  Amanda Pea, Charity fundraiser.

## 2012-03-20 NOTE — Progress Notes (Signed)
CARDIAC REHAB PHASE I   PRE:  Rate/Rhythm: 68 Pacing  BP:  Supine:   Sitting: 129/70  Standing:    SaO2: 97 RA  MODE:  Ambulation: 240 ft   POST:  Rate/Rhythem: 80  BP:  Supine:   Sitting: 125/69  Standing:    SaO2: 99 RA 1005-1050 Assisted X 1 and used walker and gait belt to ambulate. Gait steady with walker, pt tires and c/o of left hip pain with walking. She took several standing rest stops,denies any chest pain. Pt able to walk 240 feet, states that she could walk further if her hip did not hurt. Pt to recliner after walk with call light in reach.  Beatrix Fetters

## 2012-03-20 NOTE — Progress Notes (Addendum)
SUBJECTIVE: The patient is doing well today. Her CP has resolved.   At this time, she denies shortness of breath above her baseline or any new concerns.     [name] atorvastatin  10 mg Oral QHS  . beta carotene w/minerals  1 tablet Oral Daily  . cholecalciferol  1,000 Units Oral Daily  . citalopram  20 mg Oral Daily  . dabigatran  75 mg Oral Q12H  . fentaNYL  100 mcg Transdermal Q72H  . ferrous sulfate  325 mg Oral Q breakfast  . folic acid  0.5 mg Oral Daily  . furosemide  20 mg Oral Daily  . leflunomide  20 mg Oral Daily  . levothyroxine  25 mcg Oral Daily  . lisinopril  20 mg Oral Daily  . metoprolol tartrate  100 mg Oral Daily   And  . metoprolol tartrate  50 mg Oral QHS  . multivitamin with minerals  1 tablet Oral Daily  . omega-3 acid ethyl esters  1 g Oral QHS  . pantoprazole  40 mg Oral Daily  . potassium chloride  10 mEq Oral Daily  . predniSONE  5 mg Oral Daily  . sodium chloride  3 mL Intravenous Q12H  . vitamin B-12  500 mcg Oral Daily  . zinc sulfate  220 mg Oral Daily  . [DISCONTINUED] Co Q10  100 mg Oral QHS  . [DISCONTINUED] dabigatran  75 mg Oral Q12H  . [DISCONTINUED] folic acid  400 mcg Oral Daily  . [DISCONTINUED] furosemide  20-30 mg Oral Daily  . [DISCONTINUED] levothyroxine  25 mcg Oral Daily  . [DISCONTINUED] metoprolol  50-100 mg Oral BID  . [DISCONTINUED] multivitamin  1 tablet Oral Daily      OBJECTIVE: Physical Exam: Filed Vitals:   03/19/12 2119 03/19/12 2203 03/20/12 0133 03/20/12 0442  BP: 138/85 144/82 112/60 141/78  Pulse:  80 76 79  Temp:  97.9 F (36.6 C) 98.1 F (36.7 C) 98.2 F (36.8 C)  TempSrc:  Oral Oral Oral  Resp:  18 16 18   Height:      Weight:    160 lb 4.4 oz (72.7 kg)  SpO2:  96% 96% 96%    Intake/Output Summary (Last 24 hours) at 03/20/12 0755 Last data filed at 03/20/12 0448  Gross per 24 hour  Intake      0 ml  Output    575 ml  Net   -575 ml    Telemetry reveals afib, V paced  GEN- The patient is elderly  appearing, alert and oriented x 3 today.   Head- normocephalic, atraumatic Eyes-  Sclera clear, conjunctiva pink Ears- hearing intact Oropharynx- clear Neck- supple  Lungs- prolonged expiratory phase, normal work of breathing Heart- irregular rate and rhythm, 2/6 SEM LUSB, mid to late peaking GI- soft, NT, ND, + BS Extremities- no clubbing, cyanosis, or edema   LABS: Basic Metabolic Panel:  Basename 03/19/12 0950  NA 139  K 3.8  CL 103  CO2 24  GLUCOSE 90  BUN 24*  CREATININE 1.12*  CALCIUM 9.0  MG --  PHOS --   Liver Function Tests:  [facility] 03/19/12 1903  AST 26  ALT 22  ALKPHOS 40  BILITOT 0.5  PROT 5.8*  ALBUMIN 3.3*   No results found for this basename: LIPASE:2,AMYLASE:2 in the last 72 hours CBC:  Basename 03/19/12 0950  WBC 6.0  NEUTROABS 3.4  HGB 10.5*  HCT 31.5*  MCV 107.9*  PLT 122*   Cardiac Enzymes:  Basename 03/20/12 0149 03/19/12 1903 03/19/12 1316  CKTOTAL -- -- --  CKMB -- -- --  CKMBINDEX -- -- --  TROPONINI <0.30 <0.30 <0.30   RADIOLOGY: Dg Chest 2 View  03/19/2012  *RADIOLOGY REPORT*  Clinical Data: Left chest pain, pacemaker, hypertension  CHEST - 2 VIEW  Comparison: 06/03/2011, 11/22/2009  Findings: Stable mild cardiac enlargement and vascular prominence. Negative for CHF, pneumonia, effusion, pneumothorax.  Trachea is midline.  Left subclavian two lead pacer noted.  Degenerative changes of the spine.  IMPRESSION: Cardiomegaly with mild vascular congestion.  No interval change or acute process.   Original Report Authenticated By: Judie Petit. Shick, M.D.     ASSESSMENT AND PLAN:  Active Problems:  * No active hospital problems. *    1. Chest pain- resolved, CMs negative No further inpatient workup She has an AS murmur on exam.  Outpatient echo would be helpful. Add imdur 15 mg daily Continue beta blocker Stop ASA as she is also on pradaxa 2. Atrial fib with tachybrady syndrome s/p PPM, on Pradaxa 75mg  BID 3. Chronic anemia with  heme positive stools 10/211, PCP decided against colonscopy 12/2011  4. Chronic renal insufficiency stage III  5. Chronic lymphedema  6. Rheumatoid arthritis, on chronic prednisone/fentanyl patch   DC to home today Cardiac rehab to assess prior to discharge Conservative management given advanced age and comorbidities If further chest pain, then she may need an outpatient myoview.  If no further CP then I dont think that we should proceed with further ischemic workup. Outpatient echo as above  Follow-up with Norma Fredrickson or Tereso Newcomer in 1-2 weeks   Hillis Range, MD 03/20/2012 7:55 AM

## 2012-03-26 ENCOUNTER — Other Ambulatory Visit (HOSPITAL_COMMUNITY): Payer: Medicare Other

## 2012-03-26 ENCOUNTER — Encounter: Payer: Medicare Other | Admitting: Nurse Practitioner

## 2012-03-27 ENCOUNTER — Other Ambulatory Visit (HOSPITAL_COMMUNITY): Payer: Self-pay | Admitting: Internal Medicine

## 2012-03-27 DIAGNOSIS — R079 Chest pain, unspecified: Secondary | ICD-10-CM

## 2012-04-03 ENCOUNTER — Other Ambulatory Visit: Payer: Self-pay | Admitting: *Deleted

## 2012-04-03 ENCOUNTER — Ambulatory Visit (HOSPITAL_COMMUNITY): Payer: Medicare Other | Attending: Cardiovascular Disease

## 2012-04-03 ENCOUNTER — Encounter: Payer: Self-pay | Admitting: Nurse Practitioner

## 2012-04-03 ENCOUNTER — Ambulatory Visit (INDEPENDENT_AMBULATORY_CARE_PROVIDER_SITE_OTHER): Payer: Medicare Other | Admitting: Nurse Practitioner

## 2012-04-03 VITALS — BP 120/72 | HR 76 | Ht 64.0 in | Wt 162.1 lb

## 2012-04-03 DIAGNOSIS — R079 Chest pain, unspecified: Secondary | ICD-10-CM

## 2012-04-03 DIAGNOSIS — I4891 Unspecified atrial fibrillation: Secondary | ICD-10-CM | POA: Insufficient documentation

## 2012-04-03 DIAGNOSIS — I10 Essential (primary) hypertension: Secondary | ICD-10-CM

## 2012-04-03 DIAGNOSIS — E785 Hyperlipidemia, unspecified: Secondary | ICD-10-CM | POA: Insufficient documentation

## 2012-04-03 DIAGNOSIS — E876 Hypokalemia: Secondary | ICD-10-CM

## 2012-04-03 DIAGNOSIS — N189 Chronic kidney disease, unspecified: Secondary | ICD-10-CM | POA: Insufficient documentation

## 2012-04-03 DIAGNOSIS — D539 Nutritional anemia, unspecified: Secondary | ICD-10-CM

## 2012-04-03 DIAGNOSIS — I129 Hypertensive chronic kidney disease with stage 1 through stage 4 chronic kidney disease, or unspecified chronic kidney disease: Secondary | ICD-10-CM | POA: Insufficient documentation

## 2012-04-03 DIAGNOSIS — I517 Cardiomegaly: Secondary | ICD-10-CM | POA: Insufficient documentation

## 2012-04-03 DIAGNOSIS — I079 Rheumatic tricuspid valve disease, unspecified: Secondary | ICD-10-CM | POA: Insufficient documentation

## 2012-04-03 DIAGNOSIS — R072 Precordial pain: Secondary | ICD-10-CM | POA: Insufficient documentation

## 2012-04-03 LAB — CBC WITH DIFFERENTIAL/PLATELET
Basophils Absolute: 0.1 10*3/uL (ref 0.0–0.1)
Basophils Relative: 1.8 % (ref 0.0–3.0)
Eosinophils Absolute: 0.2 10*3/uL (ref 0.0–0.7)
Eosinophils Relative: 2.5 % (ref 0.0–5.0)
HCT: 33.5 % — ABNORMAL LOW (ref 36.0–46.0)
Hemoglobin: 10.9 g/dL — ABNORMAL LOW (ref 12.0–15.0)
Lymphocytes Relative: 25.1 % (ref 12.0–46.0)
Lymphs Abs: 1.6 10*3/uL (ref 0.7–4.0)
MCHC: 32.5 g/dL (ref 30.0–36.0)
MCV: 111.3 fl — ABNORMAL HIGH (ref 78.0–100.0)
Monocytes Absolute: 1 10*3/uL (ref 0.1–1.0)
Monocytes Relative: 15.7 % — ABNORMAL HIGH (ref 3.0–12.0)
Neutro Abs: 3.6 10*3/uL (ref 1.4–7.7)
Neutrophils Relative %: 54.9 % (ref 43.0–77.0)
Platelets: 129 10*3/uL — ABNORMAL LOW (ref 150.0–400.0)
RBC: 2.97 Mil/uL — ABNORMAL LOW (ref 3.87–5.11)
RDW: 18.3 % — ABNORMAL HIGH (ref 11.5–14.6)
WBC: 6.5 10*3/uL (ref 4.5–10.5)

## 2012-04-03 LAB — BASIC METABOLIC PANEL
BUN: 20 mg/dL (ref 6–23)
CO2: 29 mEq/L (ref 19–32)
Calcium: 8.9 mg/dL (ref 8.4–10.5)
Chloride: 102 mEq/L (ref 96–112)
Creatinine, Ser: 1.1 mg/dL (ref 0.4–1.2)
GFR: 49.29 mL/min — ABNORMAL LOW (ref >60.00)
Glucose, Bld: 105 mg/dL — ABNORMAL HIGH (ref 70–99)
Potassium: 3.4 mEq/L — ABNORMAL LOW (ref 3.5–5.1)
Sodium: 139 mEq/L (ref 135–145)

## 2012-04-03 MED ORDER — POTASSIUM CHLORIDE ER 10 MEQ PO TBCR: 20.0000 meq | EXTENDED_RELEASE_TABLET | Freq: Every day | ORAL | Status: DC

## 2012-04-03 NOTE — Progress Notes (Signed)
Echocardiogram performed.  

## 2012-04-03 NOTE — Progress Notes (Signed)
Candida Peeling Date of Birth: 1921-11-09 Medical Record #[id number]  History of Present Illness: [name] is seen today for a post hospital visit. She is seen for Dr. Johney Frame. She has chronic lymphedema, atrial fib with tachy-brady and has a PTVP in place, remote MI in the setting of atrial fib with RVR (negative nuclear in 2011), macrocytic anemia and stage III CKD. She is on chronic anticoagulation with Pradaxa. Her other problems are as listed below.   She was most recently admitted with substernal chest pain. She ruled out by cardiac enzymes. In light of her multiple co morbidities, age and CKD, Dr. Johney Frame has recommended medical therapy. Her low dose aspirin was stopped in light of her Pradaxa and prior history of GIB. Low dose Imdur and sl NTG prn was added. If her symptoms persisted, we would undertake Myoview testing, but otherwise try to manage her conservatively.   She comes in today. She is here with her son. She is doing ok. No more chest pain. Tolerating her medicines ok. Not dizzy or lightheaded. No headache. Using her walker. Her son is with her and he feels like she is doing well also. He is more concerned about her discharge papers listing her stage III CKD and asking about the degree of her anemia. Colonoscopy has been deferred in the past.   Current Outpatient Prescriptions on File Prior to Visit  Medication Sig Dispense Refill  . acetaminophen (TYLENOL) 500 MG tablet Take 500 mg by mouth every 6 (six) hours as needed. For pain      . atorvastatin (LIPITOR) 10 MG tablet Take 10 mg by mouth at bedtime.      . beta carotene w/minerals (OCUVITE) tablet Take 1 tablet by mouth daily.      . Cholecalciferol (VITAMIN D3) 1000 UNITS CAPS Take 1,000 Units by mouth daily.       . citalopram (CELEXA) 20 MG tablet Take 1 tablet (20 mg total) by mouth daily.  90 tablet  1  . Coenzyme Q10 (CO Q10) 100 MG TABS Take 100 mg by mouth at bedtime.       . cyanocobalamin (CYANOCOBALAMIN) 500  MCG tablet Take 1 tablet (500 mcg total) by mouth daily.  90 tablet  3  . dabigatran (PRADAXA) 75 MG CAPS Take 1 capsule (75 mg total) by mouth every 12 (twelve) hours.  180 capsule  3  . fentaNYL (DURAGESIC - DOSED MCG/HR) 100 MCG/HR Place 1 patch onto the skin every 3 (three) days.      . ferrous sulfate 325 (65 FE) MG tablet Take 325 mg by mouth daily with breakfast.        . folic acid (FOLVITE) 400 MCG tablet Take 400 mcg by mouth daily.      . furosemide (LASIX) 20 MG tablet Take 1-1.5 tablets (20-30 mg total) by mouth daily.  90 tablet  1  . isosorbide mononitrate (IMDUR) 15 mg TB24 Take 0.5 tablets (15 mg total) by mouth daily.  30 tablet  2  . Lecithin 400 MG CAPS Take 400 mg by mouth daily.       [name] leflunomide (ARAVA) 20 MG tablet Take 20 mg by mouth daily.      [name] levothyroxine (SYNTHROID, LEVOTHROID) 25 MCG tablet Take 25 mcg by mouth daily.      [name] lisinopril (PRINIVIL,ZESTRIL) 20 MG tablet Take 1 tablet (20 mg total) by mouth daily.  180 tablet  1  . metoprolol (LOPRESSOR) 100 MG tablet Take 50-100 mg by  mouth 2 (two) times daily. Take 1 tab in the morning and 1/2 tab in the evening      . Multiple Vitamin (MULTIVITAMIN) tablet Take 1 tablet by mouth daily.      . nitroGLYCERIN (NITROSTAT) 0.4 MG SL tablet Place 1 tablet (0.4 mg total) under the tongue every 5 (five) minutes x 3 doses as needed for chest pain.  25 tablet  3  . Omega-3 Fatty Acids (FISH OIL) 1000 MG CPDR Take 1,000 mg by mouth at bedtime.       . ondansetron (ZOFRAN) 4 MG tablet Take 4 mg by mouth every 8 (eight) hours as needed. For nausea      . pantoprazole (PROTONIX) 40 MG tablet Take 1 tablet (40 mg total) by mouth daily.  90 tablet  1  . potassium chloride (K-DUR) 10 MEQ tablet Take 1 tablet (10 mEq total) by mouth daily.  90 tablet  1  . predniSONE (DELTASONE) 5 MG tablet Take 5 mg by mouth daily.      [name] zinc sulfate 220 MG capsule Take 220 mg by mouth daily.          Allergies  Allergen Reactions  . Sulfa  Antibiotics Itching  . Tramadol Hcl Itching and Rash    Past Medical History  Diagnosis Date  . HEARING LOSS   . MITRAL VALVE INSUFF&AORTIC VALVE INSUFF     moderate MR  . PULMONARY HYPERTENSION   . Atrial fibrillation     permanent  . SICK SINUS SYNDROME     a. Tachybrady syndrome - Guidant PPM 10/2005.  . Edema 03/19/2009    due to venous insufficiency, chronic lymphedema  . URINARY INCONTINENCE   . Rheumatoid arthritis dx clarified 2011    On prednisone  . BREAST CANCER 12/2006    ductal ca s/p L lumpectomy  . Diastolic dysfunction   . HYPERTENSION   . HYPERLIPIDEMIA   . GERD   . HYPOTHYROIDISM   . Hyperparathyroidism     2 lobes removed  . Venous insufficiency     chronic BLE edema  . Spinal stenosis   . Anemia     a. Macrocytic - normal B12, folate 08/2011. b. 3/6 heme positive stools 10/2011 - per PCP note, elected against colonscopy.  [name] History of colonoscopy 2009  . TIA (transient ischemic attack) 05/2011  . Weight loss   . Peptic ulcer   . Heart attack     a. Reported light heart attacks 1978, 1990 in setting of AF-RVR, suspected demand ischemia.   . Renal insufficiency   . Breast cancer     Past Surgical History  Procedure Date  . Pacemaker placement 2005    SSS and syncope  . Cholecystectomy 04/06/99  . Abdominal hysterectomy 1970    Partial  . Left hip arthroscopy 1995  . Right hip arthroscopy 01/2001  . Carpal tunnel release 1998    bilateral  . Breast surgery 12/2006    Left breast lupectomy- Ductal CA  . Left parathyroidectomy 07/2008    History  Smoking status  . Never Smoker   Smokeless tobacco  . Never Used    Comment: Lives at [facility] since 03/2009- married, lives with spouse. Moved here from [facility] to be near son    History  Alcohol Use No    Family History  Problem Relation Age of Onset  . Ovarian cancer Mother   . Coronary artery disease Sister   . Coronary artery disease Brother   .  Hypertension Mother      grandparent  . Lung cancer Father     Review of Systems: The review of systems is per the HPI.  All other systems were reviewed and are negative.  Physical Exam: There were no vitals taken for this visit. Patient is very pleasant and in no acute distress. She is using a walker. Skin is warm and dry. Color is normal.  HEENT is unremarkable. Normocephalic/atraumatic. PERRL. Sclera are nonicteric. Neck is supple. No masses. No JVD. Lungs are clear. Cardiac exam shows a regular rate and rhythm. Abdomen is soft. Extremities are without edema. Gait and ROM are intact. No gross neurologic deficits noted.   LABORATORY DATA: Echo results are pending.   Lab Results  Component Value Date   WBC 4.4 03/20/2012   HGB 10.3* 03/20/2012   HCT 30.9* 03/20/2012   PLT 116* 03/20/2012   GLUCOSE 99 03/20/2012   CHOL 143 03/20/2012   TRIG 77 03/20/2012   HDL 70 03/20/2012   LDLDIRECT 131.4 12/28/2009   LDLCALC 58 03/20/2012   ALT 22 03/19/2012   AST 26 03/19/2012   NA 142 03/20/2012   K 4.2 03/20/2012   CL 105 03/20/2012   CREATININE 1.05 03/20/2012   BUN 21 03/20/2012   CO2 28 03/20/2012   TSH 1.02 05/16/2011   INR 1.27 03/19/2012   Assessment / Plan:  1. Chest pain - etiology is not clear. Could be angina, GI or musculoskeletal as I have explained to her son. She will be managed medically. She is improved. No change in her current therapy.   2. PAF - in sinus today.   3. Anemia - macrocytic - on replacement therapy. Sees her PCP later this week.  4. CKD - will recheck her lab today. I have explained that this is more of a way to better describe her renal impairment. She is not in need of dialysis.   I will see her back in about 6 weeks to recheck her. No change in her medicines today. Will continue with conservative management.   Patient is agreeable to this plan and will call if any problems develop in the interim.

## 2012-04-03 NOTE — Patient Instructions (Addendum)
I think you are doing well  We will let you know about your echo results.  Stay on your current medicines  I would like to see you in about 6 weeks to recheck you  We will check some labs today  Call the [facility] office at [phone] if you have any questions, problems or concerns.

## 2012-04-05 ENCOUNTER — Encounter (HOSPITAL_BASED_OUTPATIENT_CLINIC_OR_DEPARTMENT_OTHER): Payer: Medicare Other

## 2012-04-06 ENCOUNTER — Encounter: Payer: Self-pay | Admitting: Internal Medicine

## 2012-04-06 ENCOUNTER — Ambulatory Visit (INDEPENDENT_AMBULATORY_CARE_PROVIDER_SITE_OTHER): Payer: Medicare Other | Admitting: Internal Medicine

## 2012-04-06 VITALS — BP 110/62 | HR 86 | Temp 98.4°F | Ht 64.0 in | Wt 165.2 lb

## 2012-04-06 DIAGNOSIS — K219 Gastro-esophageal reflux disease without esophagitis: Secondary | ICD-10-CM

## 2012-04-06 DIAGNOSIS — N189 Chronic kidney disease, unspecified: Secondary | ICD-10-CM

## 2012-04-06 DIAGNOSIS — I4891 Unspecified atrial fibrillation: Secondary | ICD-10-CM

## 2012-04-06 DIAGNOSIS — N183 Chronic kidney disease, stage 3 unspecified: Secondary | ICD-10-CM | POA: Insufficient documentation

## 2012-04-06 DIAGNOSIS — I209 Angina pectoris, unspecified: Secondary | ICD-10-CM

## 2012-04-06 MED ORDER — PREDNISONE 5 MG PO TABS: 5.0000 mg | ORAL_TABLET | Freq: Every day | ORAL | Status: DC

## 2012-04-06 MED ORDER — CITALOPRAM HYDROBROMIDE 20 MG PO TABS: 20.0000 mg | ORAL_TABLET | Freq: Every day | ORAL | Status: DC

## 2012-04-06 MED ORDER — OMEPRAZOLE 20 MG PO CPDR: 20.0000 mg | DELAYED_RELEASE_CAPSULE | Freq: Every day | ORAL | Status: DC

## 2012-04-06 MED ORDER — METOPROLOL TARTRATE 100 MG PO TABS: 50.0000 mg | ORAL_TABLET | Freq: Two times a day (BID) | ORAL | Status: DC

## 2012-04-06 MED ORDER — VITAMIN B-12 500 MCG PO TABS: 500.0000 ug | ORAL_TABLET | Freq: Two times a day (BID) | ORAL | Status: DC

## 2012-04-06 MED ORDER — FUROSEMIDE 20 MG PO TABS: 20.0000 mg | ORAL_TABLET | Freq: Every day | ORAL | Status: DC

## 2012-04-06 MED ORDER — POTASSIUM CHLORIDE ER 10 MEQ PO TBCR: 20.0000 meq | EXTENDED_RELEASE_TABLET | Freq: Every day | ORAL | Status: DC

## 2012-04-06 NOTE — Patient Instructions (Addendum)
It was good to see you today. We have reviewed your prior records including labs and tests today Medications reviewed and updated, no changes at this time.  Please schedule followup in 3-6 months, call sooner if problems.

## 2012-04-06 NOTE — Progress Notes (Signed)
Subjective:    Patient ID: Nicole Roberts, female    DOB: [birth year], 78 y.o.   MRN: [id number]  HPI  Here for hospital follow up -    Past Medical History  Diagnosis Date  . HEARING LOSS   . MITRAL VALVE INSUFF&AORTIC VALVE INSUFF     moderate MR  . PULMONARY HYPERTENSION   . Atrial fibrillation     permanent  . SICK SINUS SYNDROME     a. Tachybrady syndrome - Guidant PPM 10/2005.  . Edema 03/19/2009    due to venous insufficiency, chronic lymphedema  . URINARY INCONTINENCE   . Rheumatoid arthritis dx clarified 2011    On prednisone  . BREAST CANCER 12/2006    ductal ca s/p L lumpectomy  . Diastolic dysfunction   . HYPERTENSION   . HYPERLIPIDEMIA   . GERD   . HYPOTHYROIDISM   . Hyperparathyroidism     2 lobes removed  . Venous insufficiency     chronic BLE edema  . Spinal stenosis   . Anemia     a. Macrocytic - normal B12, folate 08/2011. b. 3/6 heme positive stools 10/2011 - per PCP note, elected against colonscopy.  [name] TIA (transient ischemic attack) 05/2011     Review of Systems  Constitutional: Positive for fatigue. Negative for fever, activity change and appetite change.  Respiratory: Negative for cough and shortness of breath.   Cardiovascular: Positive for leg swelling. Negative for chest pain.  Gastrointestinal: Negative for abdominal pain and abdominal distention.  Musculoskeletal: Negative for joint swelling.  Neurological: Negative for headaches.       Objective:   Physical Exam  BP 110/62  Pulse 86  Temp 98.4 F (36.9 C) (Oral)  Ht 5\' 4"  (1.626 m)  Wt 165 lb 3.2 oz (74.934 kg)  BMI 28.36 kg/m2  SpO2 95% Wt Readings from Last 3 Encounters:  04/06/12 165 lb 3.2 oz (74.934 kg)  04/03/12 162 lb 1.9 oz (73.537 kg)  03/20/12 160 lb 4.4 oz (72.7 kg)   Constitutional: She appears well-developed and well-nourished. No distress. son at side Neck: Normal range of motion. Neck supple. No JVD present. No thyromegaly present.  Cardiovascular: Normal  rate, regular rhythm and normal heart sounds.  No murmur heard. chronic 2+ BLE edema. Pulmonary/Chest: Effort normal and breath sounds normal. No respiratory distress. She has no wheeze.  Musculoskeletal: Chronic hand/wrist arthritic changes consistent with RA - diffuse mod lymphedema of UE<LE Skin: 6 mm superficial ulceration anterior surface of R distal shin - no cellulitis or odor - no drainage at this time Psychiatric: She has a normal mood and affect. Her behavior is normal. Judgment and thought content normal.    Lab Results  Component Value Date   WBC 6.5 04/03/2012   HGB 10.9* 04/03/2012   HCT 33.5* 04/03/2012   PLT 129.0* 04/03/2012   CHOL 143 03/20/2012   TRIG 77 03/20/2012   HDL 70 03/20/2012   LDLDIRECT 131.4 12/28/2009   ALT 22 03/19/2012   AST 26 03/19/2012   NA 139 04/03/2012   K 3.4* 04/03/2012   CL 102 04/03/2012   CREATININE 1.1 04/03/2012   BUN 20 04/03/2012   CO2 29 04/03/2012   TSH 1.02 05/16/2011   INR 1.27 03/19/2012   Lab Results  Component Value Date   IRON 109 05/30/2011       Assessment & Plan:   Anginal chest pain - s/p hospitalization for same 03/2012, reviewed in length today - med mgmt ongoing given other  comorbid dz - addition of nitrates by cards reviewed - symptoms have improved, no recurrent chest pain since DC. Pt will follow up with cards as planned  CKD - mild, stable - reviewed same at length with son/pt today - no tx changes recommended but will watch closely when on higher dose diuretics for tx of lymphedema - no indication for HD or renal eval given age and other co morbidities  RLE wound - exacerbated by venous insuff and severe lymphedema -conservative care ongoing with wound clinic eval -  Time spent with pt/family today 28 minutes, greater than 50% time spent counseling patient on chest pain, CKD and medication review. Also review of prior records

## 2012-04-08 NOTE — Assessment & Plan Note (Signed)
Continue PPI Remote hx PUD, avoid ASA

## 2012-04-08 NOTE — Assessment & Plan Note (Signed)
anticoag with pradaxa - ASA stopped 01/2012 Rate controlled Follows with cards for same  The current medical regimen is effective;  continue present plan and medications.

## 2012-04-08 NOTE — Assessment & Plan Note (Signed)
Mild, stable.  

## 2012-04-11 ENCOUNTER — Telehealth: Payer: Self-pay

## 2012-04-11 MED ORDER — POTASSIUM CHLORIDE ER 10 MEQ PO TBCR: 20.0000 meq | EXTENDED_RELEASE_TABLET | Freq: Every day | ORAL | Status: DC

## 2012-04-11 MED ORDER — FUROSEMIDE 20 MG PO TABS: 20.0000 mg | ORAL_TABLET | Freq: Every day | ORAL | Status: DC

## 2012-04-11 NOTE — Telephone Encounter (Signed)
rx sent in 

## 2012-04-18 ENCOUNTER — Telehealth: Payer: Self-pay | Admitting: *Deleted

## 2012-04-18 MED ORDER — PANTOPRAZOLE SODIUM 40 MG PO TBEC: 40.0000 mg | DELAYED_RELEASE_TABLET | Freq: Every day | ORAL | Status: DC

## 2012-04-18 NOTE — Telephone Encounter (Signed)
Left msg on triage requesting to speak with Nicole Roberts. Called pt back she stated she received omeprazole in the ,ail from her mail service. Pt states she always taking protonix. Wanted to make sure she didn't need to take both. Inform pt per her records the protonix was d/c and omeprazole was added while she was in hosp. Pt states she been taking protonix for years and wanting protonix added back to her med list. She has still been taking.

## 2012-05-03 ENCOUNTER — Encounter (HOSPITAL_BASED_OUTPATIENT_CLINIC_OR_DEPARTMENT_OTHER): Payer: Medicare Other

## 2012-05-15 ENCOUNTER — Ambulatory Visit: Payer: Medicare Other | Admitting: Nurse Practitioner

## 2012-05-16 ENCOUNTER — Ambulatory Visit: Payer: Medicare Other | Admitting: Nurse Practitioner

## 2012-05-23 ENCOUNTER — Ambulatory Visit: Payer: Medicare Other | Admitting: Nurse Practitioner

## 2012-05-28 ENCOUNTER — Encounter: Payer: Self-pay | Admitting: Nurse Practitioner

## 2012-05-28 ENCOUNTER — Ambulatory Visit (INDEPENDENT_AMBULATORY_CARE_PROVIDER_SITE_OTHER): Payer: Medicare Other | Admitting: Nurse Practitioner

## 2012-05-28 VITALS — BP 140/78 | HR 64 | Ht 64.0 in | Wt 154.8 lb

## 2012-05-28 DIAGNOSIS — I4891 Unspecified atrial fibrillation: Secondary | ICD-10-CM

## 2012-05-28 LAB — CBC WITH DIFFERENTIAL/PLATELET
Basophils Absolute: 0 10*3/uL (ref 0.0–0.1)
Basophils Relative: 0.7 % (ref 0.0–3.0)
Eosinophils Absolute: 0 10*3/uL (ref 0.0–0.7)
Eosinophils Relative: 0.5 % (ref 0.0–5.0)
HCT: 31 % — ABNORMAL LOW (ref 36.0–46.0)
Hemoglobin: 10.4 g/dL — ABNORMAL LOW (ref 12.0–15.0)
Lymphocytes Relative: 18.1 % (ref 12.0–46.0)
Lymphs Abs: 1 10*3/uL (ref 0.7–4.0)
MCHC: 33.5 g/dL (ref 30.0–36.0)
MCV: 108.2 fl — ABNORMAL HIGH (ref 78.0–100.0)
Monocytes Absolute: 0.8 10*3/uL (ref 0.1–1.0)
Monocytes Relative: 13.6 % — ABNORMAL HIGH (ref 3.0–12.0)
Neutro Abs: 3.8 10*3/uL (ref 1.4–7.7)
Neutrophils Relative %: 67.1 % (ref 43.0–77.0)
Platelets: 91 10*3/uL — ABNORMAL LOW (ref 150.0–400.0)
RBC: 2.87 Mil/uL — ABNORMAL LOW (ref 3.87–5.11)
RDW: 18.1 % — ABNORMAL HIGH (ref 11.5–14.6)
WBC: 5.6 10*3/uL (ref 4.5–10.5)

## 2012-05-28 NOTE — Progress Notes (Signed)
Candida Peeling Date of Birth: December 11, 1921 Medical Record #[id number]  History of Present Illness: [name] is seen back today for a 6 week check. She is seen for Dr. Johney Frame. She has chronic lymphedema, atrial fib with tachy-brady and has a PTVP in place, remote MI in the setting of atrial fib with RVR (negative nuclear in 2011), macrocytic anemia and stage III CKD. She is on chronic anticoagulation with Pradaxa. Other issues are as listed below.   She was admitted back in November with chest pain. Negative enzymes. Medical therapy recommended in light of her age and multiple comorbidities. If her symptoms persisted, he felt we could undertake Myoview testing but otherwise, try to conservatively manage.   I saw her 6 weeks ago. She was doing well without recurrent chest pain.   She comes in today. She is here with her family. She is doing well. No chest pain. Not very active. Has her chronic edema but it is unchanged. Overall she feels ok and is holding her own. Not falling. Not dizzy or lightheaded.   Current Outpatient Prescriptions on File Prior to Visit  Medication Sig Dispense Refill  . acetaminophen (TYLENOL) 500 MG tablet Take 500 mg by mouth every 6 (six) hours as needed. For pain      . atorvastatin (LIPITOR) 10 MG tablet Take 10 mg by mouth at bedtime.      . beta carotene w/minerals (OCUVITE) tablet Take 1 tablet by mouth daily.      . Cholecalciferol (VITAMIN D3) 1000 UNITS CAPS Take 1,000 Units by mouth daily.       . citalopram (CELEXA) 20 MG tablet Take 1 tablet (20 mg total) by mouth daily.  90 tablet  1  . Coenzyme Q10 (CO Q10) 100 MG TABS Take 100 mg by mouth at bedtime.       . dabigatran (PRADAXA) 75 MG CAPS Take 1 capsule (75 mg total) by mouth every 12 (twelve) hours.  180 capsule  3  . fentaNYL (DURAGESIC - DOSED MCG/HR) 100 MCG/HR Place 1 patch onto the skin every 3 (three) days.      . ferrous sulfate 325 (65 FE) MG tablet Take 325 mg by mouth daily with breakfast.         . folic acid (FOLVITE) 400 MCG tablet Take 400 mcg by mouth daily.      . furosemide (LASIX) 20 MG tablet Take 1-1.5 tablets (20-30 mg total) by mouth daily.  90 tablet  1  . isosorbide mononitrate (IMDUR) 15 mg TB24 Take 0.5 tablets (15 mg total) by mouth daily.  30 tablet  2  . Lecithin 400 MG CAPS Take 400 mg by mouth daily.       [name] leflunomide (ARAVA) 20 MG tablet Take 20 mg by mouth daily.      [name] levothyroxine (SYNTHROID, LEVOTHROID) 25 MCG tablet Take 25 mcg by mouth daily.      [name] lisinopril (PRINIVIL,ZESTRIL) 20 MG tablet Take 1 tablet (20 mg total) by mouth daily.  180 tablet  1  . metoprolol (LOPRESSOR) 100 MG tablet Take 0.5-1 tablets (50-100 mg total) by mouth 2 (two) times daily. Take 1 tab in the morning and 1/2 tab in the evening  135 tablet  1  . Multiple Vitamin (MULTIVITAMIN) tablet Take 1 tablet by mouth daily.      . nitroGLYCERIN (NITROSTAT) 0.4 MG SL tablet Place 1 tablet (0.4 mg total) under the tongue every 5 (five) minutes x 3 doses as needed for  chest pain.  25 tablet  3  . Omega-3 Fatty Acids (FISH OIL) 1000 MG CPDR Take 1,000 mg by mouth at bedtime.       . ondansetron (ZOFRAN) 4 MG tablet Take 4 mg by mouth every 8 (eight) hours as needed. For nausea      . pantoprazole (PROTONIX) 40 MG tablet Take 1 tablet (40 mg total) by mouth daily.  90 tablet  1  . potassium chloride (K-DUR) 10 MEQ tablet Take 2 tablets (20 mEq total) by mouth daily.  90 tablet  1  . predniSONE (DELTASONE) 5 MG tablet Take 1 tablet (5 mg total) by mouth daily.  90 tablet  1  . vitamin B-12 (CYANOCOBALAMIN) 500 MCG tablet Take 1 tablet (500 mcg total) by mouth 2 (two) times daily.  90 tablet  3  . zinc sulfate 220 MG capsule Take 220 mg by mouth daily.          Allergies  Allergen Reactions  . Sulfa Antibiotics Itching  . Tramadol Hcl Itching and Rash    Past Medical History  Diagnosis Date  . HEARING LOSS   . MITRAL VALVE INSUFF&AORTIC VALVE INSUFF     moderate MR  . PULMONARY  HYPERTENSION   . Atrial fibrillation     permanent  . SICK SINUS SYNDROME     a. Tachybrady syndrome - Guidant PPM 10/2005.  . Edema 03/19/2009    due to venous insufficiency, chronic lymphedema  . URINARY INCONTINENCE   . Rheumatoid arthritis dx clarified 2011    On prednisone  . BREAST CANCER 12/2006    ductal ca s/p L lumpectomy  . Diastolic dysfunction   . HYPERTENSION   . HYPERLIPIDEMIA   . GERD   . HYPOTHYROIDISM   . Hyperparathyroidism     2 lobes removed  . Venous insufficiency     chronic BLE edema  . Spinal stenosis   . Anemia     a. Macrocytic - normal B12, folate 08/2011. b. 3/6 heme positive stools 10/2011 - per PCP note, elected against colonscopy.  [name] TIA (transient ischemic attack) 05/2011    Past Surgical History  Procedure Date  . Pacemaker placement 2005    SSS and syncope  . Cholecystectomy 04/06/99  . Abdominal hysterectomy 1970    Partial  . Left hip arthroscopy 1995  . Right hip arthroscopy 01/2001  . Carpal tunnel release 1998    bilateral  . Breast surgery 12/2006    Left breast lupectomy- Ductal CA  . Left parathyroidectomy 07/2008    History  Smoking status  . Never Smoker   Smokeless tobacco  . Never Used    Comment: Lives at [facility] since 03/2009- married, lives with spouse. Moved here from [facility] to be near son    History  Alcohol Use No    Family History  Problem Relation Age of Onset  . Ovarian cancer Mother   . Coronary artery disease Sister   . Coronary artery disease Brother   . Hypertension Mother     grandparent  . Lung cancer Father     Review of Systems: The review of systems is per the HPI.  All other systems were reviewed and are negative.  Physical Exam: BP 140/78  Pulse 64  Ht 5\' 4"  (1.626 m)  Wt 154 lb 12.8 oz (70.217 kg)  BMI 26.57 kg/m2 Patient is very pleasant and in no acute distress. Skin is warm and dry. Color is normal.  HEENT is  unremarkable. Normocephalic/atraumatic. PERRL. Sclera  are nonicteric. Neck is supple. No masses. No JVD. Lungs are clear. Cardiac exam shows a fairly regular rate and rhythm. Abdomen is soft. Extremities are without edema. Gait and ROM are intact. No gross neurologic deficits noted.  LABORATORY DATA:  Lab Results  Component Value Date   WBC 6.5 04/03/2012   HGB 10.9* 04/03/2012   HCT 33.5* 04/03/2012   PLT 129.0* 04/03/2012   GLUCOSE 105* 04/03/2012   CHOL 143 03/20/2012   TRIG 77 03/20/2012   HDL 70 03/20/2012   LDLDIRECT 131.4 12/28/2009   LDLCALC 58 03/20/2012   ALT 22 03/19/2012   AST 26 03/19/2012   NA 139 04/03/2012   K 3.4* 04/03/2012   CL 102 04/03/2012   CREATININE 1.1 04/03/2012   BUN 20 04/03/2012   CO2 29 04/03/2012   TSH 1.02 05/16/2011   INR 1.27 03/19/2012    Echo Study Conclusions from December 2013  - Left ventricle: The cavity size was normal. Wall thickness was increased in a pattern of mild LVH. Systolic function was normal. The estimated ejection fraction was in the range of 55% to 65%. Wall motion was normal; there were no regional wall motion abnormalities. - Mitral valve: Calcified annulus. - Left atrium: The atrium was mildly dilated. - Right atrium: The atrium was mildly dilated. - Atrial septum: A septal defect cannot be excluded. - Tricuspid valve: Moderate regurgitation. - Pulmonary arteries: PA peak pressure: 33mm Hg (S)   Assessment / Plan: 1. Chest pain - resolved  2. PAF - managed with rate control and anticoagulation.   3. Hypokalemia  - will recheck labs today.   4. Anemia - recheck CBC today. She does remain on Pradaxa.   Overall, I think she is doing well. I have left her on her current regimen. Check labs today. Patient is agreeable to this plan and will call if any problems develop in the interim.

## 2012-05-28 NOTE — Patient Instructions (Addendum)
Stay on your current medicines  We have to recheck some labs today  See Dr. Johney Frame back in October as planned  Call the [facility] office at [phone] if you have any questions, problems or concerns.

## 2012-05-29 LAB — BASIC METABOLIC PANEL
BUN: 19 mg/dL (ref 6–23)
CO2: 29 mEq/L (ref 19–32)
Calcium: 8.4 mg/dL (ref 8.4–10.5)
Chloride: 104 mEq/L (ref 96–112)
Creatinine, Ser: 1.1 mg/dL (ref 0.4–1.2)
GFR: 50.85 mL/min — ABNORMAL LOW (ref >60.00)
Glucose, Bld: 106 mg/dL — ABNORMAL HIGH (ref 70–99)
Potassium: 4.6 mEq/L (ref 3.5–5.1)
Sodium: 140 mEq/L (ref 135–145)

## 2012-06-04 ENCOUNTER — Telehealth: Payer: Self-pay | Admitting: Internal Medicine

## 2012-06-04 MED ORDER — ONDANSETRON HCL 4 MG PO TABS: 4.0000 mg | ORAL_TABLET | Freq: Three times a day (TID) | ORAL | Status: DC | PRN

## 2012-06-04 NOTE — Telephone Encounter (Signed)
Patient is requesting a refill on ondansetron to be sent to [facility]

## 2012-06-07 ENCOUNTER — Encounter: Payer: Self-pay | Admitting: Nurse Practitioner

## 2012-06-16 ENCOUNTER — Other Ambulatory Visit: Payer: Self-pay

## 2012-06-27 ENCOUNTER — Telehealth: Payer: Self-pay | Admitting: Internal Medicine

## 2012-06-27 NOTE — Telephone Encounter (Signed)
Patient Information:  Caller Name: Rickeya  Phone: [phone]  Patient: Nicole Roberts  Gender: Female  DOB: 03/04/1922  Age: 79 Years  PCP: [name] (Adults only)  Office Follow Up:  Does the office need to follow up with this patient?: Yes  Instructions For The Office: Further direction concerning diarrhea per guideline   Symptoms  Reason For Call & Symptoms: Vomiting, Diarrhea started Mon 2/24.  Currently has had 5 diarrhea stools in the last 24 hours, the last 2 at 10:30 pm last night 2/25 and one today 6:15am.  Stools are green.  Not eating but taking fluids, added gatoraid  Reviewed Health History In EMR: Yes  Reviewed Medications In EMR: Yes  Reviewed Allergies In EMR: Yes  Reviewed Surgeries / Procedures: Yes  Date of Onset of Symptoms: 06/25/2012  Treatments Tried: Imodium, gatoraid  Treatments Tried Worked: No  Guideline(s) Used:  Diarrhea  Disposition Per Guideline:   Callback by PCP Today  Reason For Disposition Reached:   Age > 70 years  Advice Given:  Fluids:  Drink more fluids, at least 8-10 glasses (8 oz or 240 ml) daily.  For example: sports drinks, diluted fruit juices, soft drinks.  Supplement this with saltine crackers or soups to make certain that you are getting sufficient fluid and salt to meet your body's needs.  Avoid caffeinated beverages (Reason: caffeine is mildly dehydrating).  Nutrition:  Maintaining some food intake during episodes of diarrhea is important.  Ideal initial foods include boiled starches/cereals (e.g., potatoes, rice, noodles, wheat, oats) with a small amount of salt to taste.  Other acceptable foods include: bananas, yogurt, crackers, soup.  As your stools return to normal consistency, resume a normal diet.  Expected Course:  Viral diarrhea lasts 4-7 days. Always worse on days 1 and 2.  Call Back If:  Signs of dehydration occur (e.g., no urine for more than 12 hours, very dry mouth, lightheaded, etc.)  Diarrhea  lasts over 7 days  You become worse.

## 2012-06-27 NOTE — Telephone Encounter (Signed)
Noted - OV if unimproved

## 2012-06-28 ENCOUNTER — Other Ambulatory Visit: Payer: Self-pay | Admitting: Internal Medicine

## 2012-06-29 ENCOUNTER — Telehealth: Payer: Self-pay | Admitting: Internal Medicine

## 2012-06-29 NOTE — Telephone Encounter (Signed)
Patient Information:  Caller Name: Nicole Roberts  Phone: [phone]  Patient: Nicole Roberts, Nicole Roberts  Gender: Female  DOB: 05-Feb-1922  Age: 79 Years  PCP: [name] (Adults only)  Office Follow Up:  Does the office need to follow up with this patient?: No  Instructions For The Office: N/A  RN Note:  Asking what to eat besides water and saltines. Reports mild weakness related to GI symptoms.  Vomiting ended; continues to have mild diarrhea. Able to care for husband but fatigues easily; would like to lie down for naps. No edema. Drinking fluids and voiding well. Reviewed bland diet suggestions and additional fluid sources including Gatorade, diluted juice, unsweet decaf tea.  Has  no transportation to office until 07/02/12 due to relatives are out of town.  Transferred to office scheduler/Jordan for appointment for 07/02/12 at 1530 with Dr. Felicity Coyer.  Symptoms  Reason For Call & Symptoms: Ongoing weakness following vomiting and diarrhea.  Vomiting resolved 06/27/11; still has mild diarrhea with 2 loose stools daily.  Asking what she can eat besides saltines and water.  Reviewed Health History In EMR: Yes  Reviewed Medications In EMR: Yes  Reviewed Allergies In EMR: Yes  Reviewed Surgeries / Procedures: Yes  Date of Onset of Symptoms: 06/25/2012  Treatments Tried: Saltines, water.  Treatments Tried Worked: Yes  Guideline(s) Used:  Diarrhea  Weakness (Generalized) and Fatigue  Disposition Per Guideline:   See Today in Office  Reason For Disposition Reached:   Taking a medicine that could cause weakness (e.g., blood pressure medications, diuretics)  Advice Given:  Reassurance  Weakness often accompanies viral illnesses (e.g., colds and flu).  The weakness is usually worse the first 3 days of the illness, then gets better.  Here is some care advice that should help.  Reassurance  Not drinking enough fluids and being a little dehydrated is a common cause of mild weakness.  Vomiting and  diarrhea can lead to dehydration.  Here is some care advice that should help.  Fluids  : Drink several glasses of fruit juice, other clear fluids, or water. This will improve hydration and blood glucose.  Rest  : Lie down with feet elevated for 1 hour. This will improve blood flow and increase blood flow to the brain.  Call Back If:   Still feeling weak after 2 hours of rest and fluids  Passes out (faints)  You become worse.  RN Overrode Recommendation:  Follow Up With Office Later  Scheduled appointment for 07/02/12

## 2012-07-02 ENCOUNTER — Ambulatory Visit: Payer: Medicare Other | Admitting: Internal Medicine

## 2012-07-04 ENCOUNTER — Other Ambulatory Visit (INDEPENDENT_AMBULATORY_CARE_PROVIDER_SITE_OTHER): Payer: Medicare Other

## 2012-07-04 ENCOUNTER — Encounter: Payer: Self-pay | Admitting: Internal Medicine

## 2012-07-04 ENCOUNTER — Ambulatory Visit (INDEPENDENT_AMBULATORY_CARE_PROVIDER_SITE_OTHER): Payer: Medicare Other | Admitting: Internal Medicine

## 2012-07-04 VITALS — BP 118/72 | HR 81 | Temp 98.1°F | Wt 145.0 lb

## 2012-07-04 DIAGNOSIS — M069 Rheumatoid arthritis, unspecified: Secondary | ICD-10-CM

## 2012-07-04 DIAGNOSIS — A084 Viral intestinal infection, unspecified: Secondary | ICD-10-CM

## 2012-07-04 DIAGNOSIS — E039 Hypothyroidism, unspecified: Secondary | ICD-10-CM

## 2012-07-04 DIAGNOSIS — E860 Dehydration: Secondary | ICD-10-CM

## 2012-07-04 DIAGNOSIS — A088 Other specified intestinal infections: Secondary | ICD-10-CM

## 2012-07-04 LAB — CBC WITH DIFFERENTIAL/PLATELET
Basophils Relative: 1 % (ref 0.0–3.0)
Eosinophils Relative: 1.7 % (ref 0.0–5.0)
HCT: 33.7 % — ABNORMAL LOW (ref 36.0–46.0)
Hemoglobin: 11.2 g/dL — ABNORMAL LOW (ref 12.0–15.0)
Lymphs Abs: 1.2 10*3/uL (ref 0.7–4.0)
Monocytes Relative: 12.6 % — ABNORMAL HIGH (ref 3.0–12.0)
Neutro Abs: 4.2 10*3/uL (ref 1.4–7.7)
RBC: 3.15 Mil/uL — ABNORMAL LOW (ref 3.87–5.11)
RDW: 18.4 % — ABNORMAL HIGH (ref 11.5–14.6)
WBC: 6.4 10*3/uL (ref 4.5–10.5)

## 2012-07-04 LAB — BASIC METABOLIC PANEL
CO2: 25 mEq/L (ref 19–32)
Calcium: 8.8 mg/dL (ref 8.4–10.5)
Chloride: 106 mEq/L (ref 96–112)
Creatinine, Ser: 1 mg/dL (ref 0.4–1.2)
Glucose, Bld: 97 mg/dL (ref 70–99)

## 2012-07-04 MED ORDER — DIPHENOXYLATE-ATROPINE 2.5-0.025 MG PO TABS: 1.0000 | ORAL_TABLET | Freq: Four times a day (QID) | ORAL | Status: DC | PRN

## 2012-07-04 NOTE — Assessment & Plan Note (Signed)
On low dose synthroid - recheck TSH now, adjust as needed Lab Results  Component Value Date   TSH 1.02 05/16/2011

## 2012-07-04 NOTE — Patient Instructions (Signed)
It was good to see you today. We have reviewed your prior records including labs and tests today Test(s) ordered today. Your results will be released to MyChart (or called to you) after review, usually within 72hours after test completion. If any changes need to be made, you will be notified at that same time. Medications reviewed and updated, use Lomotil as needed for diarrhea - no other changes at this time.  Your prescription(s) have been submitted to your pharmacy. Please take as directed and contact our office if you believe you are having problem(s) with the medication(s). Please keep scheduled followup as planned, call sooner if problems. B.R.A.T. Diet Your doctor has recommended the B.R.A.T. diet for you or your child until the condition improves. This is often used to help control diarrhea and vomiting symptoms. If you or your child can tolerate clear liquids, you may have:  Bananas.   Rice.   Applesauce.   Toast (and other simple starches such as crackers, potatoes, noodles).  Be sure to avoid dairy products, meats, and fatty foods until symptoms are better. Fruit juices such as apple, grape, and prune juice can make diarrhea worse. Avoid these. Continue this diet for 2 days or as instructed by your caregiver. Document Released: 04/18/2005 Document Revised: 04/07/2011 Document Reviewed: 10/05/2006 [facility] Patient Information 2012 [place], [place].Dehydration, Elderly Dehydration is when you lose more fluids from the body than you take in. Vital organs such as the kidneys, brain, and heart cannot function without a proper amount of fluids and salt. Any loss of fluids from the body can cause dehydration.  Older adults are at a higher risk of dehydration than younger adults. As we age, our bodies are less able to conserve water and do not respond to temperature changes as well. Also, older adults do not become thirsty as easily or quickly. Because of this, older adults often do not realize  they need to increase fluids to avoid dehydration.  CAUSES   Vomiting.  Diarrhea.  Excessive sweating.  Excessive urine output.  Fever. SYMPTOMS  Mild dehydration  Thirst.  Dry lips.  Slightly dry mouth. Moderate dehydration  Very dry mouth.  Sunken eyes.  Skin does not bounce back quickly when lightly pinched and released.  Dark urine and decreased urine production.  Decreased tear production.  Headache. Severe dehydration  Very dry mouth.  Extreme thirst.  Rapid, weak pulse (more than 100 beats per minute at rest).  Cold hands and feet.  Not able to sweat in spite of heat.  Rapid breathing.  Blue lips.  Confusion and lethargy.  Difficulty being awakened.  Minimal urine production.  No tears. DIAGNOSIS  Your caregiver will diagnose dehydration based on your symptoms and your exam. Blood and urine tests will help confirm the diagnosis. The diagnostic evaluation should also identify the cause of dehydration. TREATMENT  Treatment of mild or moderate dehydration can often be done at home by increasing the amount of fluids that you drink. It is best to drink small amounts of fluid more often. Drinking too much at one time can make vomiting worse.  Severe dehydration needs to be treated at the hospital where you will probably be given intravenous (IV) fluids that contain water and electrolytes. HOME CARE INSTRUCTIONS   Ask your caregiver about specific rehydration instructions.  Drink enough fluids to keep your urine clear or pale yellow.  Drink small amounts frequently if you have nausea and vomiting.  Eat as you normally do.  Avoid:  Foods or drinks  high in sugar.  Carbonated drinks.  Juice.  Extremely hot or cold fluids.  Drinks with caffeine.  Fatty, greasy foods.  Alcohol.  Tobacco.  Overeating.  Gelatin desserts.  Wash your hands well to avoid spreading bacteria and viruses.  Only take over-the-counter or prescription  medicines for pain, discomfort, or fever as directed by your caregiver.  Ask your caregiver if you should continue all prescribed and over-the-counter medicines.  Keep all follow-up appointments with your caregiver. SEEK MEDICAL CARE IF:  You have abdominal pain and it increases or stays in one area (localizes).  You have a rash, stiff neck, or severe headache.  You are irritable, sleepy, or difficult to awaken.  You are weak, dizzy, or extremely thirsty. SEEK IMMEDIATE MEDICAL CARE IF:   You are unable to keep fluids down, or you get worse despite treatment.  You have frequent episodes of vomiting or diarrhea.  You have blood or green matter (bile) in your vomit.  You have blood in your stool or your stool looks black and tarry.  You have not urinated in 6 to 8 hours, or you have only urinated a small amount of very dark urine.  You have a fever.  You faint. MAKE SURE YOU:   Understand these instructions.  Will watch your condition.  Will get help right away if you are not doing well or get worse. Document Released: 07/09/2003 Document Revised: 07/11/2011 Document Reviewed: 12/06/2010 [facility] Patient Information 2013 [place], [place].

## 2012-07-04 NOTE — Progress Notes (Signed)
Subjective:    Patient ID: Nicole Roberts, female    DOB: 26-Aug-1921, 79 y.o.   MRN: [id number]  HPI  Here for "weakness" - see CAN note  7 d of diarrhea, early vomiting first 2 days, overall improved symptoms in past 48h - associated with symptoms dehydration and anoerexia, but tolerating food and liquid at this time   Past Medical History  Diagnosis Date  . HEARING LOSS   . MITRAL VALVE INSUFF&AORTIC VALVE INSUFF     moderate MR  . PULMONARY HYPERTENSION   . Atrial fibrillation     permanent  . SICK SINUS SYNDROME     a. Tachybrady syndrome - Guidant PPM 10/2005.  . Edema 03/19/2009    due to venous insufficiency, chronic lymphedema  . URINARY INCONTINENCE   . Rheumatoid arthritis dx clarified 2011    On prednisone  . BREAST CANCER 12/2006    ductal ca s/p L lumpectomy  . Diastolic dysfunction   . HYPERTENSION   . HYPERLIPIDEMIA   . GERD   . HYPOTHYROIDISM   . Hyperparathyroidism     2 lobes removed  . Venous insufficiency     chronic BLE edema  . Spinal stenosis   . Anemia     a. Macrocytic - normal B12, folate 08/2011. b. 3/6 heme positive stools 10/2011 - per PCP note, elected against colonscopy.  [name] TIA (transient ischemic attack) 05/2011     Review of Systems  Constitutional: Positive for fatigue. Negative for fever, activity change and appetite change.  Respiratory: Negative for cough and shortness of breath.   Cardiovascular: Positive for leg swelling. Negative for chest pain.  Gastrointestinal: Negative for abdominal pain and abdominal distention.  Musculoskeletal: Negative for joint swelling.  Neurological: Negative for headaches.       Objective:   Physical Exam  BP 118/72  Pulse 81  Temp(Src) 98.1 F (36.7 C) (Oral)  Wt 145 lb (65.772 kg)  BMI 24.88 kg/m2  SpO2 93% Wt Readings from Last 3 Encounters:  07/04/12 145 lb (65.772 kg)  05/28/12 154 lb 12.8 oz (70.217 kg)  04/06/12 165 lb 3.2 oz (74.934 kg)   Constitutional: She appears  well-developed and well-nourished. No distress. son at side Neck: Normal range of motion. Neck supple. No JVD present. No thyromegaly present.  Cardiovascular: Normal rate, regular rhythm and normal heart sounds.  No murmur heard. chronic 2+ BLE edema. Pulmonary/Chest: Effort normal and breath sounds normal. No respiratory distress. She has no wheeze. Abd: SNTND+BS, no R/G  Musculoskeletal: Chronic hand/wrist arthritic changes consistent with RA - diffuse mod lymphedema of UE<LE Psychiatric: She has a normal mood and affect. Her behavior is normal. Judgment and thought content normal.    Lab Results  Component Value Date   WBC 5.6 05/28/2012   HGB 10.4* 05/28/2012   HCT 31.0* 05/28/2012   PLT 91.0* 05/28/2012   CHOL 143 03/20/2012   TRIG 77 03/20/2012   HDL 70 03/20/2012   LDLDIRECT 131.4 12/28/2009   ALT 22 03/19/2012   AST 26 03/19/2012   NA 140 05/28/2012   K 4.6 05/28/2012   CL 104 05/28/2012   CREATININE 1.1 05/28/2012   BUN 19 05/28/2012   CO2 29 05/28/2012   TSH 1.02 05/16/2011   INR 1.27 03/19/2012   Lab Results  Component Value Date   IRON 109 05/30/2011       Assessment & Plan:   Gastroenteritis, suspect viral syndrome - now resolved Dehydration, mild - check labs Weakness, nonspecific due to  same  symptomatic care advised - hydration and BRAT diet as needed Lomotil to use as needed (borrowed family member and reports better results than with OTC imodium)

## 2012-07-04 NOTE — Assessment & Plan Note (Signed)
Question weight loss as side effects of Arava - patient following with rheumatology regarding same Currently pain symptoms stable on prednisone, immunotherapy and fentanyl patch -no changes recommended by me today

## 2012-07-16 ENCOUNTER — Telehealth: Payer: Self-pay

## 2012-07-16 NOTE — Telephone Encounter (Signed)
Pt's son advised of lab results dated 07/04/2012

## 2012-07-23 ENCOUNTER — Telehealth: Payer: Self-pay | Admitting: Internal Medicine

## 2012-07-23 NOTE — Telephone Encounter (Signed)
Thank you for the note. I will review the importance of a food diary at patient's office visit.

## 2012-07-23 NOTE — Telephone Encounter (Signed)
Caller: Art/Child; Phone: [phone]; Reason for Call: Patient is scheduled to come in for evaluation of digestive issues with diarrhea.  Son calls to ask her provider to reinforce the need to keep a food diary to determine what is upsetting her system.  He feels that much has to do with what she eats.

## 2012-07-27 ENCOUNTER — Other Ambulatory Visit: Payer: Self-pay | Admitting: Internal Medicine

## 2012-08-06 ENCOUNTER — Encounter: Payer: Self-pay | Admitting: Internal Medicine

## 2012-08-06 ENCOUNTER — Ambulatory Visit (INDEPENDENT_AMBULATORY_CARE_PROVIDER_SITE_OTHER): Payer: Medicare Other | Admitting: Internal Medicine

## 2012-08-06 VITALS — BP 110/60 | HR 88 | Temp 98.4°F | Wt 148.4 lb

## 2012-08-06 DIAGNOSIS — R197 Diarrhea, unspecified: Secondary | ICD-10-CM

## 2012-08-06 DIAGNOSIS — K529 Noninfective gastroenteritis and colitis, unspecified: Secondary | ICD-10-CM

## 2012-08-06 NOTE — Patient Instructions (Addendum)
It was good to see you today. We have reviewed your prior records including labs and tests today Medications reviewed and updated, use Lomotil as needed for diarrhea - no other changes at this time.  Keep food journal along with emotional stress, medications (and pill box) and correlate with bowel habits as discussed - We can consider GI evaluation/consultation as/if needed Please schedule followup in 3-4 months for review, call sooner if problems.

## 2012-08-06 NOTE — Progress Notes (Signed)
Subjective:    Patient ID: Nicole Roberts, female    DOB: [birth year], 79 y.o.   MRN: [id number]  HPI  Here for chronic, intermittent diarrhea Currently reports no issues in last 10 days, ie "normal" Alternating bowel pattern: "normal" bowel movements with frequent, watery stools - each phase will last days to weeks before alternating to the other phase No fever, no abdominal pain, no weight loss Occasional sensation of fullness in throat after swallowing but no pain, dysphagia, regurgitation or indigestion   Past Medical History  Diagnosis Date  . HEARING LOSS   . MITRAL VALVE INSUFF&AORTIC VALVE INSUFF     moderate MR  . PULMONARY HYPERTENSION   . Atrial fibrillation     permanent  . SICK SINUS SYNDROME     a. Tachybrady syndrome - Guidant PPM 10/2005.  . Edema 03/19/2009    due to venous insufficiency, chronic lymphedema  . URINARY INCONTINENCE   . Rheumatoid arthritis dx clarified 2011    On prednisone  . BREAST CANCER 12/2006    ductal ca s/p L lumpectomy  . Diastolic dysfunction   . HYPERTENSION   . HYPERLIPIDEMIA   . GERD   . HYPOTHYROIDISM   . Hyperparathyroidism     2 lobes removed  . Venous insufficiency     chronic BLE edema  . Spinal stenosis   . Anemia     a. Macrocytic - normal B12, folate 08/2011. b. 3/6 heme positive stools 10/2011 - per PCP note, elected against colonscopy.  [name] TIA (transient ischemic attack) 05/2011     Review of Systems  Constitutional: Positive for fatigue. Negative for fever, activity change and appetite change.  Respiratory: Negative for cough and shortness of breath.   Cardiovascular: Positive for leg swelling. Negative for chest pain.  Gastrointestinal: Negative for abdominal pain and abdominal distention.  Musculoskeletal: Negative for joint swelling.  Neurological: Negative for headaches.       Objective:   Physical Exam  BP 110/60  Pulse 88  Temp(Src) 98.4 F (36.9 C) (Oral)  Wt 148 lb 6.4 oz (67.314 kg)  BMI 25.46  kg/m2  SpO2 97% Wt Readings from Last 3 Encounters:  08/06/12 148 lb 6.4 oz (67.314 kg)  07/04/12 145 lb (65.772 kg)  05/28/12 154 lb 12.8 oz (70.217 kg)   Constitutional: She appears well-developed and well-nourished. No distress. son at side Neck: Normal range of motion. Neck supple. No JVD present. No thyromegaly present.  Cardiovascular: Normal rate, regular rhythm and normal heart sounds.  No murmur heard. chronic 2+ BLE edema. Pulmonary/Chest: Effort normal and breath sounds normal. No respiratory distress. She has no wheeze. Abd: SNTND+BS, no R/G  Psychiatric: She has a normal mood and affect. Nicole Roberts behavior is normal. Judgment and thought content normal.    Lab Results  Component Value Date   WBC 6.4 07/04/2012   HGB 11.2* 07/04/2012   HCT 33.7* 07/04/2012   PLT 135.0* 07/04/2012   CHOL 143 03/20/2012   TRIG 77 03/20/2012   HDL 70 03/20/2012   LDLDIRECT 131.4 12/28/2009   ALT 22 03/19/2012   AST 26 03/19/2012   NA 138 07/04/2012   K 4.6 07/04/2012   CL 106 07/04/2012   CREATININE 1.0 07/04/2012   BUN 16 07/04/2012   CO2 25 07/04/2012   TSH 2.42 07/04/2012   INR 1.27 03/19/2012   Lab Results  Component Value Date   IRON 109 05/30/2011       Assessment & Plan:   Chronic diarrhea - exacerbated  by food choices and emotional stress (care for husband who is ill and falling) Previously referred for colonoscopy and cancelled because declines need for same No evidence for infection on history or exam Consider medication side effect - reviewed possibility of same with patient/son and recommended dosing schedule Ok to continue Lomotil as needed as reports better results than with OTC imodium Patient/family will followup if trend discovered causing diarrhea, or contact us if symptoms worse/unimproved for GI referral as initially recommended (ie, eval for possible microcytic colitis)

## 2012-08-21 ENCOUNTER — Telehealth: Payer: Self-pay | Admitting: Internal Medicine

## 2012-08-21 DIAGNOSIS — R5381 Other malaise: Secondary | ICD-10-CM

## 2012-08-21 DIAGNOSIS — G459 Transient cerebral ischemic attack, unspecified: Secondary | ICD-10-CM

## 2012-08-21 DIAGNOSIS — M069 Rheumatoid arthritis, unspecified: Secondary | ICD-10-CM

## 2012-08-21 NOTE — Telephone Encounter (Signed)
Caller: Barbara/Child; Phone: [phone]; Reason for Call: Barbara/daughter returning call to office.  Family calling to request an order for physical therapy.  States she is less and less able to get downstairs etc.  States her husband gets PT and this helps him immensely with his mobility using a walker, and family would like to see her get better mobility as well.  Per Epic, family spoke with RN in AM 08/21/12; Dr.  Felicity Coyer did write "verbal ok for PT as requested.  " Family requests [facility], as they take care of Dad as well.  Advised caller this has been ordered; no further questions or concerns at this time.  Krs/can

## 2012-08-21 NOTE — Telephone Encounter (Signed)
Caller: Barbara/Other; Phone: [phone]; Reason for Call: Caller states pt is living in an independent care facility.  Pt is having trouble with mobility and caller is wanting to get PT for her.  Caller states there is someone that comes out to the facility to see patient's spouse (with [place]).  Caller is hoping that Dr Felicity Coyer can write an order so that the patient can get PT and extend her stay at the independent living facility.  OFFICE PLEASE FOLLOW UP WITH CALLER.  (caller did not want to have patient seen due to the fact it is very difficult to get her in and out)

## 2012-08-21 NOTE — Telephone Encounter (Signed)
Verbal ok for PT as requested 

## 2012-08-21 NOTE — Telephone Encounter (Signed)
Left message for Nicole Roberts to callback office.

## 2012-08-22 NOTE — Addendum Note (Signed)
Addended by: [name] A on: 08/22/2012 08:13 AM   Modules accepted: Orders

## 2012-08-29 ENCOUNTER — Telehealth: Payer: Self-pay | Admitting: Internal Medicine

## 2012-08-29 MED ORDER — CITALOPRAM HYDROBROMIDE 20 MG PO TABS: 20.0000 mg | ORAL_TABLET | Freq: Every day | ORAL | Status: DC

## 2012-08-29 NOTE — Telephone Encounter (Signed)
Pt is requesting a refill on Citalopram.  She needs a 30 day supply to go to [facility] on [address] and a 90 day supply to go to [facility].

## 2012-08-29 NOTE — Telephone Encounter (Signed)
Inform pt sent rx to both pharmacy...Raechel Chute

## 2012-08-30 ENCOUNTER — Other Ambulatory Visit: Payer: Self-pay | Admitting: Internal Medicine

## 2012-08-30 MED ORDER — PANTOPRAZOLE SODIUM 40 MG PO TBEC: DELAYED_RELEASE_TABLET | ORAL | Status: DC

## 2012-08-30 NOTE — Addendum Note (Signed)
Addended by: Deatra James on: 08/30/2012 08:28 AM   Modules accepted: Orders

## 2012-08-31 ENCOUNTER — Other Ambulatory Visit: Payer: Self-pay

## 2012-08-31 MED ORDER — METOPROLOL TARTRATE 100 MG PO TABS: 50.0000 mg | ORAL_TABLET | Freq: Two times a day (BID) | ORAL | Status: DC

## 2012-09-03 ENCOUNTER — Telehealth: Payer: Self-pay

## 2012-09-03 NOTE — Telephone Encounter (Signed)
PT called requesting verbal orders to continue services - 2/week x 2, 3/week x 1, and 3/week x 1. Verbal authorization given per VAL protocol.

## 2012-09-06 ENCOUNTER — Telehealth: Payer: Self-pay

## 2012-09-06 NOTE — Telephone Encounter (Signed)
Nicole Roberts with [facility] called requesting verbal orders to continue Occ.Therp 2 x wk for 4 more weeks. Please advise if ok Thanks

## 2012-09-06 NOTE — Telephone Encounter (Signed)
Ok thanks 

## 2012-09-07 NOTE — Telephone Encounter (Signed)
Nancy notified/lmovm 

## 2012-09-12 DIAGNOSIS — D649 Anemia, unspecified: Secondary | ICD-10-CM

## 2012-09-12 DIAGNOSIS — M6281 Muscle weakness (generalized): Secondary | ICD-10-CM

## 2012-09-12 DIAGNOSIS — R269 Unspecified abnormalities of gait and mobility: Secondary | ICD-10-CM

## 2012-09-12 DIAGNOSIS — M069 Rheumatoid arthritis, unspecified: Secondary | ICD-10-CM

## 2012-09-12 DIAGNOSIS — I10 Essential (primary) hypertension: Secondary | ICD-10-CM

## 2012-09-12 DIAGNOSIS — Z5189 Encounter for other specified aftercare: Secondary | ICD-10-CM

## 2012-09-13 ENCOUNTER — Encounter (HOSPITAL_BASED_OUTPATIENT_CLINIC_OR_DEPARTMENT_OTHER): Payer: Medicare Other | Attending: Internal Medicine

## 2012-09-13 DIAGNOSIS — R609 Edema, unspecified: Secondary | ICD-10-CM | POA: Insufficient documentation

## 2012-09-14 ENCOUNTER — Telehealth: Payer: Self-pay | Admitting: *Deleted

## 2012-09-14 NOTE — Telephone Encounter (Signed)
Left msg on triage mother-in-law saw wound doctor on yesterday & her ankles was still swollen. Wanting to see what is the maximum dose she can take on her lasix. Currently taking 60mg ...Raechel Chute

## 2012-09-14 NOTE — Telephone Encounter (Signed)
Called Nicole Roberts again still no answer LMOM with Nicole Roberts response...lmb

## 2012-09-14 NOTE — Telephone Encounter (Signed)
Called barbara no answer LMOm RTC...Raechel Chute

## 2012-09-14 NOTE — Telephone Encounter (Signed)
Can take up to 80 mg for the next 2 days only then back to 60 mg daily

## 2012-09-16 ENCOUNTER — Encounter (HOSPITAL_COMMUNITY): Payer: Self-pay | Admitting: Adult Health

## 2012-09-16 ENCOUNTER — Inpatient Hospital Stay (HOSPITAL_COMMUNITY)
Admission: EM | Admit: 2012-09-16 | Discharge: 2012-09-21 | DRG: 603 | Disposition: A | Discharge status: Skilled Nursing Facility | Payer: Medicare Other | Attending: Internal Medicine | Admitting: Internal Medicine

## 2012-09-16 DIAGNOSIS — D649 Anemia, unspecified: Secondary | ICD-10-CM

## 2012-09-16 DIAGNOSIS — E039 Hypothyroidism, unspecified: Secondary | ICD-10-CM | POA: Diagnosis present

## 2012-09-16 DIAGNOSIS — N183 Chronic kidney disease, stage 3 unspecified: Secondary | ICD-10-CM | POA: Diagnosis present

## 2012-09-16 DIAGNOSIS — Z8673 Personal history of transient ischemic attack (TIA), and cerebral infarction without residual deficits: Secondary | ICD-10-CM

## 2012-09-16 DIAGNOSIS — I129 Hypertensive chronic kidney disease with stage 1 through stage 4 chronic kidney disease, or unspecified chronic kidney disease: Secondary | ICD-10-CM | POA: Diagnosis present

## 2012-09-16 DIAGNOSIS — L0291 Cutaneous abscess, unspecified: Secondary | ICD-10-CM

## 2012-09-16 DIAGNOSIS — I080 Rheumatic disorders of both mitral and aortic valves: Secondary | ICD-10-CM

## 2012-09-16 DIAGNOSIS — R609 Edema, unspecified: Secondary | ICD-10-CM

## 2012-09-16 DIAGNOSIS — I872 Venous insufficiency (chronic) (peripheral): Secondary | ICD-10-CM | POA: Diagnosis present

## 2012-09-16 DIAGNOSIS — Z950 Presence of cardiac pacemaker: Secondary | ICD-10-CM | POA: Diagnosis present

## 2012-09-16 DIAGNOSIS — L03119 Cellulitis of unspecified part of limb: Principal | ICD-10-CM | POA: Diagnosis present

## 2012-09-16 DIAGNOSIS — I2789 Other specified pulmonary heart diseases: Secondary | ICD-10-CM

## 2012-09-16 DIAGNOSIS — D696 Thrombocytopenia, unspecified: Secondary | ICD-10-CM | POA: Diagnosis present

## 2012-09-16 DIAGNOSIS — R197 Diarrhea, unspecified: Secondary | ICD-10-CM | POA: Diagnosis present

## 2012-09-16 DIAGNOSIS — K219 Gastro-esophageal reflux disease without esophagitis: Secondary | ICD-10-CM | POA: Diagnosis present

## 2012-09-16 DIAGNOSIS — R490 Dysphonia: Secondary | ICD-10-CM

## 2012-09-16 DIAGNOSIS — R0789 Other chest pain: Secondary | ICD-10-CM

## 2012-09-16 DIAGNOSIS — E785 Hyperlipidemia, unspecified: Secondary | ICD-10-CM

## 2012-09-16 DIAGNOSIS — N189 Chronic kidney disease, unspecified: Secondary | ICD-10-CM | POA: Diagnosis present

## 2012-09-16 DIAGNOSIS — G459 Transient cerebral ischemic attack, unspecified: Secondary | ICD-10-CM

## 2012-09-16 DIAGNOSIS — C50919 Malignant neoplasm of unspecified site of unspecified female breast: Secondary | ICD-10-CM

## 2012-09-16 DIAGNOSIS — I517 Cardiomegaly: Secondary | ICD-10-CM

## 2012-09-16 DIAGNOSIS — I10 Essential (primary) hypertension: Secondary | ICD-10-CM

## 2012-09-16 DIAGNOSIS — H9190 Unspecified hearing loss, unspecified ear: Secondary | ICD-10-CM

## 2012-09-16 DIAGNOSIS — L0390 Cellulitis, unspecified: Secondary | ICD-10-CM

## 2012-09-16 DIAGNOSIS — I509 Heart failure, unspecified: Secondary | ICD-10-CM

## 2012-09-16 DIAGNOSIS — D72829 Elevated white blood cell count, unspecified: Secondary | ICD-10-CM | POA: Diagnosis present

## 2012-09-16 DIAGNOSIS — I4891 Unspecified atrial fibrillation: Secondary | ICD-10-CM | POA: Diagnosis present

## 2012-09-16 DIAGNOSIS — M069 Rheumatoid arthritis, unspecified: Secondary | ICD-10-CM | POA: Diagnosis present

## 2012-09-16 DIAGNOSIS — I495 Sick sinus syndrome: Secondary | ICD-10-CM

## 2012-09-16 DIAGNOSIS — L02419 Cutaneous abscess of limb, unspecified: Principal | ICD-10-CM | POA: Diagnosis present

## 2012-09-16 HISTORY — DX: Anxiety disorder, unspecified: F41.9

## 2012-09-16 LAB — BASIC METABOLIC PANEL
CO2: 29 mEq/L (ref 19–32)
Calcium: 9.2 mg/dL (ref 8.4–10.5)
Glucose, Bld: 106 mg/dL — ABNORMAL HIGH (ref 70–99)
Sodium: 138 mEq/L (ref 135–145)

## 2012-09-16 LAB — CBC WITH DIFFERENTIAL/PLATELET
Eosinophils Relative: 0 % (ref 0–5)
HCT: 33.2 % — ABNORMAL LOW (ref 36.0–46.0)
Lymphocytes Relative: 5 % — ABNORMAL LOW (ref 12–46)
Lymphs Abs: 0.6 10*3/uL — ABNORMAL LOW (ref 0.7–4.0)
MCV: 105.1 fL — ABNORMAL HIGH (ref 78.0–100.0)
Platelets: 131 10*3/uL — ABNORMAL LOW (ref 150–400)
RBC: 3.16 MIL/uL — ABNORMAL LOW (ref 3.87–5.11)
WBC: 12.2 10*3/uL — ABNORMAL HIGH (ref 4.0–10.5)

## 2012-09-16 MED ORDER — CLINDAMYCIN PHOSPHATE 600 MG/50ML IV SOLN
600.0000 mg | Freq: Once | INTRAVENOUS | Status: AC
  Administered 2012-09-16: 600 mg via INTRAVENOUS
  Filled 2012-09-16 (×2): qty 50

## 2012-09-16 MED ORDER — SODIUM CHLORIDE 0.9 % IV BOLUS (SEPSIS)
500.0000 mL | Freq: Once | INTRAVENOUS | Status: AC
  Administered 2012-09-16: 500 mL via INTRAVENOUS

## 2012-09-16 MED ORDER — MORPHINE SULFATE 4 MG/ML IJ SOLN
2.0000 mg | Freq: Once | INTRAMUSCULAR | Status: AC
  Administered 2012-09-16: 2 mg via INTRAVENOUS
  Filled 2012-09-16 (×2): qty 1

## 2012-09-16 NOTE — ED Provider Notes (Signed)
Medical screening examination/treatment/procedure(s) were conducted as a shared visit with non-physician practitioner(s) and myself.  I personally evaluated the patient during the encounter  Pt with peripheral edema history, reports weeping area on right lower medial calf, seen by Dr. Leanord Hawking at wound center.  Today, leg became much more painful, current red, hot to touch, tender.  WBC is up, suspect cellulitis.  Mild renal insuff, elevated BUN, likely some dehydration.  Doubt sepsis at this point, normotensive, normal mentation.  Certainly not appearance or history suggestive of DVT. Will start IV clindamycin, IVF's, and admit.    Impression: Cellulitis right lower leg Renal insuff   Nicole Roberts. Oletta Lamas, MD 09/16/12 [id number]

## 2012-09-16 NOTE — H&P (Signed)
[facility] History and Physical  Nicole Roberts ZOX:[id number] DOB: Nov 17, 1921 DOA: 09/16/2012  Referring physician: ER physician. PCP: [name], MD  Specialists: [facility] cardiology.  Chief Complaint: Right leg pain and erythema.  HPI: Nicole Roberts is a 79 y.o. female with known history of rheumatoid arthritis, atrial fibrillation on Pradaxa, 6 sinus syndrome status post pacemaker placement, hyperlipidemia, hypothyroidism, chronic kidney disease started experiencing pain and redness in her right lower extremity since morning today. The pain and describes it worsened. Initially started off at the anterior shin and slowly enclosed the whole lower part of her leg. Patient denies any fever chills. At this time patient has been admitted for cellulitis of the lower extremity. Patient has history of chronic lymphedema of both lower extremity. Patient did go to her podiatrist last week due to fluid seeping out. Podiatrist at that time did not want to place her on any wraps because of history of CHF. Patient denies any trauma or insect bite. Patient still has some fluid draining from the leg which does not look purulent. Patient at this time does not look toxic and does not have any features to suggest compartment syndrome.  Review of Systems: As presented in the history of presenting illness, rest negative.  Past Medical History  Diagnosis Date  . HEARING LOSS   . MITRAL VALVE INSUFF&AORTIC VALVE INSUFF     moderate MR  . PULMONARY HYPERTENSION   . Atrial fibrillation     permanent  . SICK SINUS SYNDROME     a. Tachybrady syndrome - Guidant PPM 10/2005.  . Edema 03/19/2009    due to venous insufficiency, chronic lymphedema  . URINARY INCONTINENCE   . Rheumatoid arthritis dx clarified 2011    On prednisone  . BREAST CANCER 12/2006    ductal ca s/p L lumpectomy  . Diastolic dysfunction   . HYPERTENSION   . HYPERLIPIDEMIA   . GERD   . HYPOTHYROIDISM   . Hyperparathyroidism      2 lobes removed  . Venous insufficiency     chronic BLE edema  . Spinal stenosis   . Anemia     a. Macrocytic - normal B12, folate 08/2011. b. 3/6 heme positive stools 10/2011 - per PCP note, elected against colonscopy.  [name] TIA (transient ischemic attack) 05/2011   Past Surgical History  Procedure Laterality Date  . Pacemaker placement  2005    SSS and syncope  . Cholecystectomy  04/06/99  . Abdominal hysterectomy  1970    Partial  . Left hip arthroscopy  1995  . Right hip arthroscopy  01/2001  . Carpal tunnel release  1998    bilateral  . Breast surgery  12/2006    Left breast lupectomy- Ductal CA  . Left parathyroidectomy  07/2008   Social History:  reports that she has never smoked. She has never used smokeless tobacco. She reports that she does not drink alcohol or use illicit drugs. Lives at home. where does patient live-- Can do ADLs. Can patient participate in ADLs?  Allergies  Allergen Reactions  . Sulfa Antibiotics Itching  . Tramadol Hcl Itching and Rash    Family History  Problem Relation Age of Onset  . Ovarian cancer Mother   . Coronary artery disease Sister   . Coronary artery disease Brother   . Hypertension Mother     grandparent  . Lung cancer Father       Prior to Admission medications   Medication Sig Start Date End Date Taking? Authorizing  Provider  atorvastatin (LIPITOR) 10 MG tablet Take 10 mg by mouth at bedtime.   Yes Historical Provider, MD  beta carotene w/minerals (OCUVITE) tablet Take 1 tablet by mouth daily.   Yes Historical Provider, MD  Cholecalciferol (VITAMIN D3) 1000 UNITS CAPS Take 1,000 Units by mouth daily.    Yes Historical Provider, MD  citalopram (CELEXA) 20 MG tablet Take 20 mg by mouth daily. 08/29/12  Yes Newt Lukes, MD  Coenzyme Q10 (CO Q10) 100 MG TABS Take 100 mg by mouth at bedtime.    Yes Historical Provider, MD  dabigatran (PRADAXA) 75 MG CAPS Take 75 mg by mouth every 12 (twelve) hours. 02/01/12  Yes Hillis Range,  MD  fentaNYL (DURAGESIC - DOSED MCG/HR) 100 MCG/HR Place 1 patch onto the skin every 3 (three) days.   Yes Historical Provider, MD  ferrous sulfate 325 (65 FE) MG tablet Take 325 mg by mouth daily with breakfast.    Yes Historical Provider, MD  folic acid (FOLVITE) 400 MCG tablet Take 400 mcg by mouth daily.   Yes Historical Provider, MD  furosemide (LASIX) 20 MG tablet Take 20-30 mg by mouth 2 (two) times daily. Take 1 to one and one-half tablets daily.   Yes Historical Provider, MD  isosorbide mononitrate (IMDUR) 15 mg TB24 Take 15 mg by mouth daily. 03/20/12  Yes Roger A Arguello, PA-C  Lecithin 400 MG CAPS Take 400 mg by mouth daily.    Yes Historical Provider, MD  leflunomide (ARAVA) 20 MG tablet Take 20 mg by mouth daily.   Yes Historical Provider, MD  levothyroxine (SYNTHROID, LEVOTHROID) 25 MCG tablet TAKE 1 TABLET DAILY 07/27/12  Yes Newt Lukes, MD  lisinopril (PRINIVIL,ZESTRIL) 20 MG tablet Take 1 tablet (20 mg total) by mouth daily. 02/01/12  Yes Hillis Range, MD  metoprolol (LOPRESSOR) 100 MG tablet Take 50-100 mg by mouth 2 (two) times daily. Take 1 tab in the morning and 1/2 tab in the evening 08/31/12  Yes Newt Lukes, MD  Multiple Vitamin (MULTIVITAMIN) tablet Take 1 tablet by mouth daily.   Yes Historical Provider, MD  Omega-3 Fatty Acids (FISH OIL) 1000 MG CPDR Take 1,000 mg by mouth at bedtime.    Yes Historical Provider, MD  ondansetron (ZOFRAN) 4 MG tablet Take 4 mg by mouth every 8 (eight) hours as needed for nausea. For nausea 06/04/12  Yes Newt Lukes, MD  pantoprazole (PROTONIX) 40 MG tablet Take 40 mg by mouth daily. TAKE 1 TABLET DAILY 08/30/12  Yes Newt Lukes, MD  potassium chloride (K-DUR) 10 MEQ tablet Take 20 mEq by mouth daily. 04/11/12  Yes Corwin Levins, MD  predniSONE (DELTASONE) 5 MG tablet Take 5 mg by mouth daily. 04/06/12  Yes Newt Lukes, MD  vitamin B-12 (CYANOCOBALAMIN) 500 MCG tablet Take 500 mcg by mouth 2 (two) times daily.  04/06/12  Yes Newt Lukes, MD  zinc sulfate 220 MG capsule Take 220 mg by mouth daily.    Yes Historical Provider, MD  acetaminophen (TYLENOL) 500 MG tablet Take 500 mg by mouth every 6 (six) hours as needed for pain. For pain    Historical Provider, MD  diphenoxylate-atropine (LOMOTIL) 2.5-0.025 MG per tablet Take 1 tablet by mouth 4 (four) times daily as needed for diarrhea or loose stools. 07/04/12   Newt Lukes, MD  nitroGLYCERIN (NITROSTAT) 0.4 MG SL tablet Place 0.4 mg under the tongue every 5 (five) minutes x 3 doses as needed for chest pain.  03/20/12   Gery Pray, PA-C   Physical Exam: Filed Vitals:   09/16/12 1929 09/16/12 2256  BP: 124/74 142/73  Pulse: 102 91  Temp: 97.9 F (36.6 C) 99.5 F (37.5 C)  TempSrc: Oral Oral  Resp: 16 16  SpO2: 98% 100%     General:  Well-developed well-nourished.  Eyes: Anicteric no pallor.  ENT: No discharge from the ears eyes nose mouth.  Neck: No mass felt.  Cardiovascular: S1-S2 heard. Mildly tachycardic.  Respiratory: No rhonchi or crepitations.  Abdomen: Soft nontender bowel sounds present.  Skin: Erythema of the right lower leg extending from the ankle up to her knees. Patient is able to move her foot and knee is some mild pain. Patient has good sensation and capillary refill.  Musculoskeletal: Bilateral lower extremity lymphedema.  Psychiatric: Appears normal.  Neurologic: Alert awake oriented to time place and person. Moves all extremities.  Labs on Admission:  Basic Metabolic Panel:  Recent Labs Lab 09/16/12 1951  NA 138  K 4.5  CL 99  CO2 29  GLUCOSE 106*  BUN 38*  CREATININE 1.44*  CALCIUM 9.2   Liver Function Tests: No results found for this basename: AST, ALT, ALKPHOS, BILITOT, PROT, ALBUMIN,  in the last 168 hours No results found for this basename: LIPASE, AMYLASE,  in the last 168 hours No results found for this basename: AMMONIA,  in the last 168 hours CBC:  Recent Labs Lab  09/16/12 1951  WBC 12.2*  NEUTROABS 10.8*  HGB 11.6*  HCT 33.2*  MCV 105.1*  PLT 131*   Cardiac Enzymes: No results found for this basename: CKTOTAL, CKMB, CKMBINDEX, TROPONINI,  in the last 168 hours  BNP (last 3 results)  Recent Labs  03/19/12 1009  PROBNP 4164.0*   CBG: No results found for this basename: GLUCAP,  in the last 168 hours  Radiological Exams on Admission: No results found.   Assessment/Plan Principal Problem:   Cellulitis Active Problems:   HYPOTHYROIDISM   Rheumatoid arthritis   PACEMAKER-[employer]   Atrial fibrillation   CKD (chronic kidney disease)   1. Cellulitis of the right lower extremity - given that patient has immunocompromised state from being on immunosuppressants due to her rheumatoid arthritis at this time patient has been placed on vancomycin and Zosyn. ER physician as already ordered Dopplers to check for DVT. Patient is on Pradaxa for A. fib. I have ordered to keep her leg mildly elevated. Closely follow clinically. At this time patient does not have any features to suggest compartment syndrome. Wound team consult. 2. Atrial fibrillation on Pradaxa rate is mildly elevated - patient has not taken her evening dose of metoprolol. Her rate may be mildly elevated due to this and also mild pain from the cellulitis. For this reason we will monitor in telemetry. Continue home medications. Since patient has cellulitis and if there is possibility of procedure I have held patient's Pradaxa and for now placed on heparin infusion per pharmacy. 3. Rheumatoid arthritis - continue home medications. Patient is on prednisone and presently looks hemodynamically stable. If there is any change in her vital signs are patient gets sicker may need stress dose steroids. 4. Chronic kidney disease with mildly elevated creatinine from baseline - patient did receive some fluid in the ER. But given her a history of CHF I have held more fluids for now. Closely  follow her metabolic panel intake output. Continue with her Lasix. UA is pending. If there is any worsening of the creatinine  then we may have to hold lisinopril and Lasix. 5. History of CHF and history of sick sinus syndrome status post pacemaker placement - presently looks compensated but given that patient's creatinine is mildly elevated closely follow intake output and metabolic panel. 6. History of hypothyroidism and hyperlipidemia - continue home medications. Check TSH. 7. Chronic anemia - follow CBC.    Code Status: Full code.  Family Communication: Patient's son at the bedside.  Disposition Plan: Admit to inpatient.    [place] [facility] Pager [phone].  If 7PM-7AM, please contact night-coverage [email or web address] Password [email or web address] 09/16/2012, 11:01 PM

## 2012-09-16 NOTE — ED Provider Notes (Signed)
History     CSN: [id number]  Arrival date & time 09/16/12  4098   First MD Initiated Contact with Patient 09/16/12 2012      Chief Complaint  Patient presents with  . Leg Pain    (Consider location/radiation/quality/duration/timing/severity/associated sxs/prior treatment) HPI History provided by pt.   Pt has chronic lymphedema and a draining wound on right shin, for which [facility] a [facility] basis.  Had firey red skin discoloration of shin upon waking this morning and erythema has spread throughout the day.  Associated w/ severe pain and worse than baseline edema.  Denies fever.  Denies trauma.  RF for DVT include relative immobility and age.  Denies CP/SOB. Past Medical History  Diagnosis Date  . HEARING LOSS   . MITRAL VALVE INSUFF&AORTIC VALVE INSUFF     moderate MR  . PULMONARY HYPERTENSION   . Atrial fibrillation     permanent  . SICK SINUS SYNDROME     a. Tachybrady syndrome - Guidant PPM 10/2005.  . Edema 03/19/2009    due to venous insufficiency, chronic lymphedema  . URINARY INCONTINENCE   . Rheumatoid arthritis dx clarified 2011    On prednisone  . BREAST CANCER 12/2006    ductal ca s/p L lumpectomy  . Diastolic dysfunction   . HYPERTENSION   . HYPERLIPIDEMIA   . GERD   . HYPOTHYROIDISM   . Hyperparathyroidism     2 lobes removed  . Venous insufficiency     chronic BLE edema  . Spinal stenosis   . Anemia     a. Macrocytic - normal B12, folate 08/2011. b. 3/6 heme positive stools 10/2011 - per PCP note, elected against colonscopy.  [name] TIA (transient ischemic attack) 05/2011    Past Surgical History  Procedure Laterality Date  . Pacemaker placement  2005    SSS and syncope  . Cholecystectomy  04/06/99  . Abdominal hysterectomy  1970    Partial  . Left hip arthroscopy  1995  . Right hip arthroscopy  01/2001  . Carpal tunnel release  1998    bilateral  . Breast surgery  12/2006    Left breast lupectomy- Ductal CA  . Left  parathyroidectomy  07/2008    Family History  Problem Relation Age of Onset  . Ovarian cancer Mother   . Coronary artery disease Sister   . Coronary artery disease Brother   . Hypertension Mother     grandparent  . Lung cancer Father     History  Substance Use Topics  . Smoking status: Never Smoker   . Smokeless tobacco: Never Used     Comment: Lives at [facility] since 03/2009- married, lives with spouse. Moved here from [facility] to be near son  . Alcohol Use: No    OB History   Grav Para Term Preterm Abortions TAB SAB Ect Mult Living                  Review of Systems  All other systems reviewed and are negative.    Allergies  Sulfa antibiotics and Tramadol hcl  Home Medications   Current Outpatient Rx  Name  Route  Sig  Dispense  Refill  . atorvastatin (LIPITOR) 10 MG tablet   Oral   Take 10 mg by mouth at bedtime.         . beta carotene w/minerals (OCUVITE) tablet   Oral   Take 1 tablet by mouth daily.         [name]  Cholecalciferol (VITAMIN D3) 1000 UNITS CAPS   Oral   Take 1,000 Units by mouth daily.          . citalopram (CELEXA) 20 MG tablet   Oral   Take 20 mg by mouth daily.         . Coenzyme Q10 (CO Q10) 100 MG TABS   Oral   Take 100 mg by mouth at bedtime.          . dabigatran (PRADAXA) 75 MG CAPS   Oral   Take 75 mg by mouth every 12 (twelve) hours.         . fentaNYL (DURAGESIC - DOSED MCG/HR) 100 MCG/HR   Transdermal   Place 1 patch onto the skin every 3 (three) days.         . ferrous sulfate 325 (65 FE) MG tablet   Oral   Take 325 mg by mouth daily with breakfast.          . folic acid (FOLVITE) 400 MCG tablet   Oral   Take 400 mcg by mouth daily.         . furosemide (LASIX) 20 MG tablet   Oral   Take 20-30 mg by mouth 2 (two) times daily. Take 1 to one and one-half tablets daily.         . isosorbide mononitrate (IMDUR) 15 mg TB24   Oral   Take 15 mg by mouth daily.         .  Lecithin 400 MG CAPS   Oral   Take 400 mg by mouth daily.          [name] leflunomide (ARAVA) 20 MG tablet   Oral   Take 20 mg by mouth daily.         [name] levothyroxine (SYNTHROID, LEVOTHROID) 25 MCG tablet      TAKE 1 TABLET DAILY   90 tablet   3   . lisinopril (PRINIVIL,ZESTRIL) 20 MG tablet   Oral   Take 1 tablet (20 mg total) by mouth daily.   180 tablet   1   . metoprolol (LOPRESSOR) 100 MG tablet   Oral   Take 50-100 mg by mouth 2 (two) times daily. Take 1 tab in the morning and 1/2 tab in the evening         . Multiple Vitamin (MULTIVITAMIN) tablet   Oral   Take 1 tablet by mouth daily.         . Omega-3 Fatty Acids (FISH OIL) 1000 MG CPDR   Oral   Take 1,000 mg by mouth at bedtime.          . ondansetron (ZOFRAN) 4 MG tablet   Oral   Take 4 mg by mouth every 8 (eight) hours as needed for nausea. For nausea         . pantoprazole (PROTONIX) 40 MG tablet   Oral   Take 40 mg by mouth daily. TAKE 1 TABLET DAILY         . potassium chloride (K-DUR) 10 MEQ tablet   Oral   Take 20 mEq by mouth daily.         . predniSONE (DELTASONE) 5 MG tablet   Oral   Take 5 mg by mouth daily.         . vitamin B-12 (CYANOCOBALAMIN) 500 MCG tablet   Oral   Take 500 mcg by mouth 2 (two) times daily.         [name]  zinc sulfate 220 MG capsule   Oral   Take 220 mg by mouth daily.          [name] acetaminophen (TYLENOL) 500 MG tablet   Oral   Take 500 mg by mouth every 6 (six) hours as needed for pain. For pain         . diphenoxylate-atropine (LOMOTIL) 2.5-0.025 MG per tablet   Oral   Take 1 tablet by mouth 4 (four) times daily as needed for diarrhea or loose stools.         . nitroGLYCERIN (NITROSTAT) 0.4 MG SL tablet   Sublingual   Place 0.4 mg under the tongue every 5 (five) minutes x 3 doses as needed for chest pain.           BP 124/74  Pulse 102  Temp(Src) 97.9 F (36.6 C) (Oral)  Resp 16  SpO2 98%  Physical Exam  Nursing note and vitals  reviewed. Constitutional: She is oriented to person, place, and time. She appears well-developed and well-nourished. No distress.  HENT:  Head: Normocephalic and atraumatic.  Eyes:  Normal appearance  Neck: Normal range of motion.  Cardiovascular: Normal rate and regular rhythm.   Pulmonary/Chest: Effort normal and breath sounds normal. No respiratory distress.  Musculoskeletal: Normal range of motion.  Circumferential erythema of right lower leg that spreads just proximal to knee on anterior aspect.  Warm to touch and very tender.  No crepitus.  Pencil-tip sized ulceration on medial aspect of right shin that is draining a tiny amt of serous fluid.  No pain w/ ROM of ankle or knee.  2+ DP pulse and distal sensation intact.   Neurological: She is alert and oriented to person, place, and time.  Skin: Skin is warm and dry. No rash noted.  Psychiatric: She has a normal mood and affect. Her behavior is normal.    ED Course  Procedures (including critical care time)  Labs Reviewed  CBC WITH DIFFERENTIAL - Abnormal; Notable for the following:    WBC 12.2 (*)    RBC 3.16 (*)    Hemoglobin 11.6 (*)    HCT 33.2 (*)    MCV 105.1 (*)    MCH 36.7 (*)    RDW 16.6 (*)    Platelets 131 (*)    Neutrophils Relative % 89 (*)    Neutro Abs 10.8 (*)    Lymphocytes Relative 5 (*)    Lymphs Abs 0.6 (*)    All other components within normal limits  BASIC METABOLIC PANEL - Abnormal; Notable for the following:    Glucose, Bld 106 (*)    BUN 38 (*)    Creatinine, Ser 1.44 (*)    GFR calc non Af Amer 31 (*)    GFR calc Af Amer 36 (*)    All other components within normal limits   No results found.   1. Cellulitis       MDM  79yo F presents w/ what appears to be cellulitis of entire right lower leg.  Onset sx this morning, spread rapid, pain gradually increasing in severity.  Afebrile and non-toxic appearing, no crepitus, full, non-painful ROM of joints.  Labs sig for mild leukocytosis and AKI.   DVT unlikely but because patient is elderly and relatively immobile, venous doppler ordered to verify.  Pt to receive IVF, clindamycin and 2mg  morphine.  [facility] consulted for admission.  9:51 PM         Otilio Miu, PA-C 09/16/12 2258

## 2012-09-16 NOTE — ED Notes (Signed)
Presents with onset of redness, edema and pain to right lower extremity that began this am. Pt reports weeping edema, none noted at this time. Reports burning and pain worse with touch.

## 2012-09-16 NOTE — ED Notes (Signed)
Report attempted 

## 2012-09-17 ENCOUNTER — Other Ambulatory Visit: Payer: Self-pay | Admitting: Internal Medicine

## 2012-09-17 DIAGNOSIS — M79609 Pain in unspecified limb: Secondary | ICD-10-CM

## 2012-09-17 DIAGNOSIS — R609 Edema, unspecified: Secondary | ICD-10-CM

## 2012-09-17 DIAGNOSIS — I509 Heart failure, unspecified: Secondary | ICD-10-CM

## 2012-09-17 DIAGNOSIS — D649 Anemia, unspecified: Secondary | ICD-10-CM

## 2012-09-17 LAB — CBC WITH DIFFERENTIAL/PLATELET
Basophils Relative: 0 % (ref 0–1)
Eosinophils Absolute: 0.1 10*3/uL (ref 0.0–0.7)
Eosinophils Relative: 1 % (ref 0–5)
Hemoglobin: 10.2 g/dL — ABNORMAL LOW (ref 12.0–15.0)
MCH: 36.2 pg — ABNORMAL HIGH (ref 26.0–34.0)
MCHC: 34.2 g/dL (ref 30.0–36.0)
Monocytes Absolute: 0.4 10*3/uL (ref 0.1–1.0)
Monocytes Relative: 3 % (ref 3–12)
Neutrophils Relative %: 89 % — ABNORMAL HIGH (ref 43–77)

## 2012-09-17 LAB — URINALYSIS, ROUTINE W REFLEX MICROSCOPIC
Glucose, UA: NEGATIVE mg/dL
Hgb urine dipstick: NEGATIVE
Protein, ur: NEGATIVE mg/dL
pH: 5.5 (ref 5.0–8.0)

## 2012-09-17 LAB — COMPREHENSIVE METABOLIC PANEL
ALT: 38 U/L — ABNORMAL HIGH (ref 0–35)
AST: 21 U/L (ref 0–37)
CO2: 24 mEq/L (ref 19–32)
Calcium: 8.2 mg/dL — ABNORMAL LOW (ref 8.4–10.5)
Chloride: 102 mEq/L (ref 96–112)
GFR calc non Af Amer: 38 mL/min — ABNORMAL LOW (ref >90)
Sodium: 137 mEq/L (ref 135–145)

## 2012-09-17 LAB — TSH: TSH: 1.058 u[IU]/mL (ref 0.350–4.500)

## 2012-09-17 LAB — URINE MICROSCOPIC-ADD ON

## 2012-09-17 MED ORDER — FOLIC ACID 1 MG PO TABS
1.0000 mg | ORAL_TABLET | Freq: Every day | ORAL | Status: DC
  Administered 2012-09-17 – 2012-09-21 (×5): 1 mg via ORAL
  Filled 2012-09-17 (×5): qty 1

## 2012-09-17 MED ORDER — NITROGLYCERIN 0.4 MG SL SUBL: 0.4000 mg | SUBLINGUAL_TABLET | SUBLINGUAL | Status: DC | PRN

## 2012-09-17 MED ORDER — CYANOCOBALAMIN 250 MCG PO TABS
500.0000 ug | ORAL_TABLET | Freq: Two times a day (BID) | ORAL | Status: DC
  Administered 2012-09-17 – 2012-09-21 (×8): 500 ug via ORAL
  Filled 2012-09-17 (×11): qty 2

## 2012-09-17 MED ORDER — VANCOMYCIN HCL 10 G IV SOLR
1250.0000 mg | Freq: Once | INTRAVENOUS | Status: AC
  Administered 2012-09-17: 1250 mg via INTRAVENOUS
  Filled 2012-09-17: qty 1250

## 2012-09-17 MED ORDER — ACETAMINOPHEN 650 MG RE SUPP: 650.0000 mg | Freq: Four times a day (QID) | RECTAL | Status: DC | PRN

## 2012-09-17 MED ORDER — VANCOMYCIN HCL IN DEXTROSE 1-5 GM/200ML-% IV SOLN
1000.0000 mg | INTRAVENOUS | Status: DC
  Administered 2012-09-19 – 2012-09-21 (×2): 1000 mg via INTRAVENOUS
  Filled 2012-09-17 (×2): qty 200

## 2012-09-17 MED ORDER — LEVOTHYROXINE SODIUM 25 MCG PO TABS
25.0000 ug | ORAL_TABLET | Freq: Every day | ORAL | Status: DC
  Administered 2012-09-17 – 2012-09-21 (×5): 25 ug via ORAL
  Filled 2012-09-17 (×6): qty 1

## 2012-09-17 MED ORDER — SODIUM CHLORIDE 0.9 % IJ SOLN
3.0000 mL | Freq: Two times a day (BID) | INTRAMUSCULAR | Status: DC
  Administered 2012-09-17 – 2012-09-20 (×5): 3 mL via INTRAVENOUS

## 2012-09-17 MED ORDER — METOPROLOL TARTRATE 50 MG PO TABS: 50.0000 mg | ORAL_TABLET | Freq: Two times a day (BID) | ORAL | Status: DC

## 2012-09-17 MED ORDER — CITALOPRAM HYDROBROMIDE 20 MG PO TABS: 20.0000 mg | ORAL_TABLET | Freq: Every day | ORAL | Status: DC

## 2012-09-17 MED ORDER — METOPROLOL TARTRATE 100 MG PO TABS
100.0000 mg | ORAL_TABLET | Freq: Every day | ORAL | Status: DC
  Administered 2012-09-17 – 2012-09-21 (×5): 100 mg via ORAL
  Filled 2012-09-17 (×5): qty 1

## 2012-09-17 MED ORDER — POTASSIUM CHLORIDE ER 10 MEQ PO TBCR
20.0000 meq | EXTENDED_RELEASE_TABLET | Freq: Every day | ORAL | Status: DC
  Administered 2012-09-17 – 2012-09-18 (×2): 20 meq via ORAL
  Filled 2012-09-17 (×2): qty 2

## 2012-09-17 MED ORDER — OCUVITE PO TABS
1.0000 | ORAL_TABLET | Freq: Every day | ORAL | Status: DC
  Administered 2012-09-17 – 2012-09-21 (×5): 1 via ORAL
  Filled 2012-09-17 (×5): qty 1

## 2012-09-17 MED ORDER — ONDANSETRON HCL 4 MG PO TABS: 4.0000 mg | ORAL_TABLET | Freq: Three times a day (TID) | ORAL | Status: DC | PRN

## 2012-09-17 MED ORDER — PIPERACILLIN-TAZOBACTAM 3.375 G IVPB
3.3750 g | Freq: Three times a day (TID) | INTRAVENOUS | Status: DC
  Administered 2012-09-17 – 2012-09-21 (×13): 3.375 g via INTRAVENOUS
  Filled 2012-09-17 (×14): qty 50

## 2012-09-17 MED ORDER — PANTOPRAZOLE SODIUM 40 MG PO TBEC
40.0000 mg | DELAYED_RELEASE_TABLET | Freq: Every day | ORAL | Status: DC
  Administered 2012-09-17 – 2012-09-21 (×5): 40 mg via ORAL
  Filled 2012-09-17 (×5): qty 1

## 2012-09-17 MED ORDER — SODIUM CHLORIDE 0.9 % IJ SOLN
3.0000 mL | Freq: Two times a day (BID) | INTRAMUSCULAR | Status: DC
  Administered 2012-09-18 – 2012-09-20 (×4): 3 mL via INTRAVENOUS

## 2012-09-17 MED ORDER — ONDANSETRON HCL 4 MG/2ML IJ SOLN
4.0000 mg | Freq: Four times a day (QID) | INTRAMUSCULAR | Status: DC | PRN
  Administered 2012-09-19: 4 mg via INTRAVENOUS
  Filled 2012-09-17: qty 2

## 2012-09-17 MED ORDER — VITAMIN D3 25 MCG (1000 UNIT) PO TABS
1000.0000 [IU] | ORAL_TABLET | Freq: Every day | ORAL | Status: DC
  Administered 2012-09-17 – 2012-09-21 (×5): 1000 [IU] via ORAL
  Filled 2012-09-17 (×5): qty 1

## 2012-09-17 MED ORDER — FENTANYL 100 MCG/HR TD PT72
100.0000 ug | MEDICATED_PATCH | TRANSDERMAL | Status: DC
  Administered 2012-09-17 – 2012-09-20 (×2): 100 ug via TRANSDERMAL
  Filled 2012-09-17 (×2): qty 1

## 2012-09-17 MED ORDER — FUROSEMIDE 20 MG PO TABS
20.0000 mg | ORAL_TABLET | Freq: Two times a day (BID) | ORAL | Status: DC
  Administered 2012-09-17 – 2012-09-21 (×9): 20 mg via ORAL
  Filled 2012-09-17 (×11): qty 1

## 2012-09-17 MED ORDER — ACETAMINOPHEN 325 MG PO TABS: 650.0000 mg | ORAL_TABLET | Freq: Four times a day (QID) | ORAL | Status: DC | PRN

## 2012-09-17 MED ORDER — FERROUS SULFATE 325 (65 FE) MG PO TABS
325.0000 mg | ORAL_TABLET | Freq: Every day | ORAL | Status: DC
  Administered 2012-09-17 – 2012-09-21 (×5): 325 mg via ORAL
  Filled 2012-09-17 (×6): qty 1

## 2012-09-17 MED ORDER — PANTOPRAZOLE SODIUM 40 MG PO TBEC: 40.0000 mg | DELAYED_RELEASE_TABLET | Freq: Every day | ORAL | Status: DC

## 2012-09-17 MED ORDER — ISOSORBIDE MONONITRATE 15 MG HALF TABLET: 15.0000 mg | ORAL_TABLET | Freq: Every day | ORAL | Status: DC

## 2012-09-17 MED ORDER — ATORVASTATIN CALCIUM 10 MG PO TABS
10.0000 mg | ORAL_TABLET | Freq: Every day | ORAL | Status: DC
  Administered 2012-09-17 – 2012-09-20 (×5): 10 mg via ORAL
  Filled 2012-09-17 (×6): qty 1

## 2012-09-17 MED ORDER — ZINC SULFATE 220 (50 ZN) MG PO CAPS
220.0000 mg | ORAL_CAPSULE | Freq: Every day | ORAL | Status: DC
  Administered 2012-09-17 – 2012-09-21 (×5): 220 mg via ORAL
  Filled 2012-09-17 (×5): qty 1

## 2012-09-17 MED ORDER — ONDANSETRON HCL 4 MG PO TABS
4.0000 mg | ORAL_TABLET | Freq: Four times a day (QID) | ORAL | Status: DC | PRN
  Administered 2012-09-17: 4 mg via ORAL
  Filled 2012-09-17: qty 1

## 2012-09-17 MED ORDER — LISINOPRIL 20 MG PO TABS
20.0000 mg | ORAL_TABLET | Freq: Every day | ORAL | Status: DC
  Administered 2012-09-17 – 2012-09-18 (×2): 20 mg via ORAL
  Filled 2012-09-17 (×2): qty 1

## 2012-09-17 MED ORDER — OMEGA-3-ACID ETHYL ESTERS 1 G PO CAPS
1.0000 g | ORAL_CAPSULE | Freq: Every day | ORAL | Status: DC
  Administered 2012-09-17 – 2012-09-18 (×3): 1 g via ORAL
  Filled 2012-09-17 (×6): qty 1

## 2012-09-17 MED ORDER — PREDNISONE 5 MG PO TABS
5.0000 mg | ORAL_TABLET | Freq: Every day | ORAL | Status: DC
  Administered 2012-09-17 – 2012-09-21 (×5): 5 mg via ORAL
  Filled 2012-09-17 (×6): qty 1

## 2012-09-17 MED ORDER — CITALOPRAM HYDROBROMIDE 20 MG PO TABS
20.0000 mg | ORAL_TABLET | Freq: Every day | ORAL | Status: DC
  Administered 2012-09-17 – 2012-09-21 (×5): 20 mg via ORAL
  Filled 2012-09-17 (×5): qty 1

## 2012-09-17 MED ORDER — FISH OIL 1000 MG PO CPDR: 1000.0000 mg | DELAYED_RELEASE_CAPSULE | Freq: Every day | ORAL | Status: DC

## 2012-09-17 MED ORDER — LEFLUNOMIDE 20 MG PO TABS
20.0000 mg | ORAL_TABLET | Freq: Every day | ORAL | Status: DC
  Administered 2012-09-17 – 2012-09-21 (×5): 20 mg via ORAL
  Filled 2012-09-17 (×5): qty 1

## 2012-09-17 MED ORDER — HEPARIN (PORCINE) IN NACL 100-0.45 UNIT/ML-% IJ SOLN
1050.0000 [IU]/h | INTRAMUSCULAR | Status: DC
  Administered 2012-09-17: 900 [IU]/h via INTRAVENOUS
  Administered 2012-09-18: 950 [IU]/h via INTRAVENOUS
  Administered 2012-09-18: 1050 [IU]/h via INTRAVENOUS
  Filled 2012-09-17 (×3): qty 250

## 2012-09-17 MED ORDER — ADULT MULTIVITAMIN W/MINERALS CH
1.0000 | ORAL_TABLET | Freq: Every day | ORAL | Status: DC
  Administered 2012-09-17 – 2012-09-19 (×3): 1 via ORAL
  Filled 2012-09-17 (×5): qty 1

## 2012-09-17 MED ORDER — ISOSORBIDE MONONITRATE 15 MG HALF TABLET
15.0000 mg | ORAL_TABLET | Freq: Every day | ORAL | Status: DC
  Administered 2012-09-17 – 2012-09-21 (×5): 15 mg via ORAL
  Filled 2012-09-17 (×5): qty 1

## 2012-09-17 MED ORDER — METOPROLOL TARTRATE 50 MG PO TABS
50.0000 mg | ORAL_TABLET | Freq: Every day | ORAL | Status: DC
  Administered 2012-09-17 – 2012-09-20 (×5): 50 mg via ORAL
  Filled 2012-09-17 (×7): qty 1

## 2012-09-17 MED ORDER — MORPHINE SULFATE 2 MG/ML IJ SOLN
1.0000 mg | INTRAMUSCULAR | Status: DC | PRN
  Administered 2012-09-17 – 2012-09-18 (×4): 1 mg via INTRAVENOUS
  Filled 2012-09-17 (×4): qty 1

## 2012-09-17 NOTE — Progress Notes (Signed)
Utilization review completed.  P.J. Kimeka Badour,RN,BSN Case Manager [phone]  

## 2012-09-17 NOTE — Progress Notes (Signed)
[facility] PROGRESS NOTE  Nicole Roberts JYN:[id number] DOB: 01-Jul-1921 DOA: 09/16/2012 PCP: [name], MD  Assessment/Plan: 1-Cellulitis of the right lower extremity: Continue with IV zosyn and Vancomycin day 1. Wound care consulted. Patient should keep leg elevated. Doppler pending. WBC trending down.  2-Atrial fibrillation: Continue with metoprolol. Would probably transition to pradaxa.  3-Rheumatoid arthritis - continue home medications. Prednisone,  4-Chronic kidney disease: Cr decrease to 1.2. 5-History of CHF and history of sick sinus syndrome status post pacemaker placement: Continue with lasix, Imdur.  6-History of hypothyroidism and hyperlipidemia - continue home medications.  TSH pending.Nicole Roberts  7-Chronic anemia - monitor on heparin.    Code Status: Full Family Communication: Care discussed with patient.  Disposition Plan: to be determine.    Consultants:  None.   Procedures:  LE Doppler pending.   Antibiotics:  Zosyn 5-19  Vancomycin 5-19  HPI/Subjective: Still with pain right leg, the nurse just gave her IV pain medication. Denies dyspnea or abdominal pain.   Objective: Filed Vitals:   09/16/12 1929 09/16/12 2256 09/16/12 2349 09/17/12 0500  BP: 124/74 142/73 146/82 111/71  Pulse: 102 91 96 93  Temp: 97.9 F (36.6 C) 99.5 F (37.5 C) 99.2 F (37.3 C) 98.5 F (36.9 C)  TempSrc: Oral Oral Oral   Resp: 16 16 18 18   Height:   5\' 4"  (1.626 m)   Weight:   67.903 kg (149 lb 11.2 oz)   SpO2: 98% 100% 100% 96%   No intake or output data in the 24 hours ending 09/17/12 0817 Filed Weights   09/16/12 2349  Weight: 67.903 kg (149 lb 11.2 oz)    Exam:   General:  Very pleasant, sitting in bed, eating breakfast  Cardiovascular: S 1., S 2 IRR  Respiratory: CTA  Abdomen: BS present, soft, NT  Musculoskeletal: Right LE with swelling, edema and redness.   Data Reviewed: Basic Metabolic Panel:  Recent Labs Lab 09/16/12 1951 09/17/12 0455   NA 138 137  K 4.5 4.0  CL 99 102  CO2 29 24  GLUCOSE 106* 91  BUN 38* 34*  CREATININE 1.44* 1.22*  CALCIUM 9.2 8.2*   Liver Function Tests:  Recent Labs Lab 09/17/12 0455  AST 21  ALT 38*  ALKPHOS 39  BILITOT 1.1  PROT 5.4*  ALBUMIN 2.9*   No results found for this basename: LIPASE, AMYLASE,  in the last 168 hours No results found for this basename: AMMONIA,  in the last 168 hours CBC:  Recent Labs Lab 09/16/12 1951 09/17/12 0455  WBC 12.2* 10.7*  NEUTROABS 10.8* 9.6*  HGB 11.6* 10.2*  HCT 33.2* 29.8*  MCV 105.1* 105.7*  PLT 131* 115*   Cardiac Enzymes: No results found for this basename: CKTOTAL, CKMB, CKMBINDEX, TROPONINI,  in the last 168 hours BNP (last 3 results)  Recent Labs  03/19/12 1009  PROBNP 4164.0*   CBG: No results found for this basename: GLUCAP,  in the last 168 hours  No results found for this or any previous visit (from the past 240 hour(s)).   Studies: No results found.  Scheduled Meds: . atorvastatin  10 mg Oral QHS  . beta carotene w/minerals  1 tablet Oral Daily  . cholecalciferol  1,000 Units Oral Daily  . citalopram  20 mg Oral Daily  . fentaNYL  100 mcg Transdermal Q72H  . ferrous sulfate  325 mg Oral Q breakfast  . folic acid  1 mg Oral Daily  . furosemide  20 mg Oral BID  .  isosorbide mononitrate  15 mg Oral Daily  . leflunomide  20 mg Oral Daily  . levothyroxine  25 mcg Oral QAC breakfast  . lisinopril  20 mg Oral Daily  . metoprolol tartrate  100 mg Oral Daily  . metoprolol  50 mg Oral QHS  . multivitamin with minerals  1 tablet Oral Daily  . omega-3 acid ethyl esters  1 g Oral QHS  . pantoprazole  40 mg Oral Daily  . piperacillin-tazobactam (ZOSYN)  IV  3.375 g Intravenous Q8H  . potassium chloride  20 mEq Oral Daily  . predniSONE  5 mg Oral Q breakfast  . sodium chloride  3 mL Intravenous Q12H  . sodium chloride  3 mL Intravenous Q12H  . [START ON 09/19/2012] vancomycin  1,000 mg Intravenous Q48H  . vitamin  B-12  500 mcg Oral BID  . zinc sulfate  220 mg Oral Daily   Continuous Infusions: . heparin 900 Units/hr (09/17/12 0316)    Principal Problem:   Cellulitis Active Problems:   HYPOTHYROIDISM   Rheumatoid arthritis   PACEMAKER-[employer]   Atrial fibrillation   CKD (chronic kidney disease)    Time spent: 25 minutes.     Nicole Roberts  [facility] Pager [phone]. If 7PM-7AM, please contact night-coverage at [email or web address], password [facility] 09/17/2012, 8:17 AM  LOS: 1 day

## 2012-09-17 NOTE — Progress Notes (Signed)
CONSULT NOTE - Initial Consult  Pharmacy Consult for Heparin; Vancomycin and Zosyn  Indication: h/o Afib; RLE cellulitis  Allergies  Allergen Reactions  . Sulfa Antibiotics Itching  . Tramadol Hcl Itching and Rash    Patient Measurements: Height: 5\' 4"  (162.6 cm) Weight: 149 lb 11.2 oz (67.903 kg) IBW/kg (Calculated) : 54.7  Vital Signs: Temp: 99.2 F (37.3 C) (05/18 2349) Temp src: Oral (05/18 2349) BP: 146/82 mmHg (05/18 2349) Pulse Rate: 96 (05/18 2349)  Labs:  Recent Labs  09/16/12 1951  HGB 11.6*  HCT 33.2*  PLT 131*  CREATININE 1.44*    Estimated Creatinine Clearance: 25.6 ml/min (by C-G formula based on Cr of 1.44).   Medical History: Past Medical History  Diagnosis Date  . HEARING LOSS   . MITRAL VALVE INSUFF&AORTIC VALVE INSUFF     moderate MR  . PULMONARY HYPERTENSION   . Atrial fibrillation     permanent  . SICK SINUS SYNDROME     a. Tachybrady syndrome - Guidant PPM 10/2005.  . Edema 03/19/2009    due to venous insufficiency, chronic lymphedema  . URINARY INCONTINENCE   . Rheumatoid arthritis dx clarified 2011    On prednisone  . BREAST CANCER 12/2006    ductal ca s/p L lumpectomy  . Diastolic dysfunction   . HYPERTENSION   . HYPERLIPIDEMIA   . GERD   . HYPOTHYROIDISM   . Hyperparathyroidism     2 lobes removed  . Venous insufficiency     chronic BLE edema  . Spinal stenosis   . Anemia     a. Macrocytic - normal B12, folate 08/2011. b. 3/6 heme positive stools 10/2011 - per PCP note, elected against colonscopy.  [name] TIA (transient ischemic attack) 05/2011    Medications:  Prescriptions prior to admission  Medication Sig Dispense Refill  . atorvastatin (LIPITOR) 10 MG tablet Take 10 mg by mouth at bedtime.      . beta carotene w/minerals (OCUVITE) tablet Take 1 tablet by mouth daily.      . Cholecalciferol (VITAMIN D3) 1000 UNITS CAPS Take 1,000 Units by mouth daily.       . citalopram (CELEXA) 20 MG tablet Take 20 mg by mouth daily.       . Coenzyme Q10 (CO Q10) 100 MG TABS Take 100 mg by mouth at bedtime.       . dabigatran (PRADAXA) 75 MG CAPS Take 75 mg by mouth every 12 (twelve) hours.      . fentaNYL (DURAGESIC - DOSED MCG/HR) 100 MCG/HR Place 1 patch onto the skin every 3 (three) days.      . ferrous sulfate 325 (65 FE) MG tablet Take 325 mg by mouth daily with breakfast.       . folic acid (FOLVITE) 400 MCG tablet Take 400 mcg by mouth daily.      . furosemide (LASIX) 20 MG tablet Take 20-30 mg by mouth 2 (two) times daily. Take 1 to one and one-half tablets daily.      . isosorbide mononitrate (IMDUR) 15 mg TB24 Take 15 mg by mouth daily.      . Lecithin 400 MG CAPS Take 400 mg by mouth daily.       [name] leflunomide (ARAVA) 20 MG tablet Take 20 mg by mouth daily.      [name] levothyroxine (SYNTHROID, LEVOTHROID) 25 MCG tablet TAKE 1 TABLET DAILY  90 tablet  3  . lisinopril (PRINIVIL,ZESTRIL) 20 MG tablet Take 1 tablet (20 mg total)  by mouth daily.  180 tablet  1  . metoprolol (LOPRESSOR) 100 MG tablet Take 50-100 mg by mouth 2 (two) times daily. Take 1 tab in the morning and 1/2 tab in the evening      . Multiple Vitamin (MULTIVITAMIN) tablet Take 1 tablet by mouth daily.      . Omega-3 Fatty Acids (FISH OIL) 1000 MG CPDR Take 1,000 mg by mouth at bedtime.       . ondansetron (ZOFRAN) 4 MG tablet Take 4 mg by mouth every 8 (eight) hours as needed for nausea. For nausea      . pantoprazole (PROTONIX) 40 MG tablet Take 40 mg by mouth daily. TAKE 1 TABLET DAILY      . potassium chloride (K-DUR) 10 MEQ tablet Take 20 mEq by mouth daily.      . predniSONE (DELTASONE) 5 MG tablet Take 5 mg by mouth daily.      . vitamin B-12 (CYANOCOBALAMIN) 500 MCG tablet Take 500 mcg by mouth 2 (two) times daily.      [name] zinc sulfate 220 MG capsule Take 220 mg by mouth daily.       [name] acetaminophen (TYLENOL) 500 MG tablet Take 500 mg by mouth every 6 (six) hours as needed for pain. For pain      . diphenoxylate-atropine (LOMOTIL) 2.5-0.025 MG per  tablet Take 1 tablet by mouth 4 (four) times daily as needed for diarrhea or loose stools.      . nitroGLYCERIN (NITROSTAT) 0.4 MG SL tablet Place 0.4 mg under the tongue every 5 (five) minutes x 3 doses as needed for chest pain.        Assessment: 79 yo female with RLE swelling/cellulitis for empiric antibiotics.  Home Pradaxa on hold in case of procedure, heparin for bridge.  Last dose taken AM 5/18.  Goal of Therapy:  Heparin level 0.3-0.7 units/ml Monitor platelets by anticoagulation protocol: Yes Vancomycin trough 15-20  Plan:  Start heparin 900 units/hr Check heparin level in 8 hours. Vancomycin 1250 mg IV now, then 1 g IV q48h Zosyn 3.375 g IV q8h   Eddie Candle 09/17/2012,12:27 AM

## 2012-09-17 NOTE — Progress Notes (Signed)
ANTICOAGULATION CONSULT NOTE - Follow Up Consult  Pharmacy Consult for Heparin Indication: atrial fibrillation  Allergies  Allergen Reactions  . Sulfa Antibiotics Itching  . Tramadol Hcl Itching and Rash    Patient Measurements: Height: 5\' 4"  (162.6 cm) Weight: 149 lb 11.2 oz (67.903 kg) IBW/kg (Calculated) : 54.7 Heparin Dosing Weight: 67  Vital Signs: Temp: 98.5 F (36.9 C) (05/19 0500) BP: 114/65 mmHg (05/19 0958) Pulse Rate: 98 (05/19 0958)  Labs:  Recent Labs  09/16/12 1951 09/17/12 0455 09/17/12 1040  HGB 11.6* 10.2*  --   HCT 33.2* 29.8*  --   PLT 131* 115*  --   HEPARINUNFRC  --   --  0.31  CREATININE 1.44* 1.22*  --     Estimated Creatinine Clearance: 30.2 ml/min (by C-G formula based on Cr of 1.22).   Medications:  Scheduled:  . atorvastatin  10 mg Oral QHS  . beta carotene w/minerals  1 tablet Oral Daily  . cholecalciferol  1,000 Units Oral Daily  . citalopram  20 mg Oral Daily  . fentaNYL  100 mcg Transdermal Q72H  . ferrous sulfate  325 mg Oral Q breakfast  . folic acid  1 mg Oral Daily  . furosemide  20 mg Oral BID  . isosorbide mononitrate  15 mg Oral Daily  . leflunomide  20 mg Oral Daily  . levothyroxine  25 mcg Oral QAC breakfast  . lisinopril  20 mg Oral Daily  . metoprolol tartrate  100 mg Oral Daily  . metoprolol  50 mg Oral QHS  . multivitamin with minerals  1 tablet Oral Daily  . omega-3 acid ethyl esters  1 g Oral QHS  . pantoprazole  40 mg Oral Daily  . piperacillin-tazobactam (ZOSYN)  IV  3.375 g Intravenous Q8H  . potassium chloride  20 mEq Oral Daily  . predniSONE  5 mg Oral Q breakfast  . sodium chloride  3 mL Intravenous Q12H  . sodium chloride  3 mL Intravenous Q12H  . [START ON 09/19/2012] vancomycin  1,000 mg Intravenous Q48H  . vitamin B-12  500 mcg Oral BID  . zinc sulfate  220 mg Oral Daily   Infusions:  . heparin 900 Units/hr (09/17/12 0316)    Assessment: 79 yo F admitted with LE cellulitis and started on  Vancomycin and Zosyn.  Pt on Pradaxa PTA for hx afib and was transitioned to Heparin on admission in the event that a surgical procedure was needed.  Heparin level = 0.31 on 900 units/hr.  Goal of Therapy:  Heparin level 0.3-0.7 units/ml Monitor platelets by anticoagulation protocol: Yes   Plan:  Increase heparin to 950 units/hr to maintain within goal heparin range. Follow-up heparin level and CBC in AM. Follow-up restart of Pradaxa.  [employer], Pharm.D., BCPS Clinical Pharmacist Pager [phone] 09/17/2012 1:51 PM

## 2012-09-17 NOTE — Progress Notes (Signed)
Right lower extremity venous duplex completed.  Right:  No evidence of DVT, superficial thrombosis, or Baker's cyst.  Left:  Negative for DVT in the common femoral vein.  

## 2012-09-17 NOTE — Consult Note (Signed)
WOC consult Note Reason for Consult: Right lower leg cellulitis  Wound type: redness to right leg, no open areas Drainage (amount, consistency, odor): right leg is weeping a small amt of serous fluid Periwound: Intact Dressing procedure/placement/frequency: No topical treatment needed at this time.  If skin begins to blister and drain, may use foam dressings to absorb excess drainage.  Pt uses compression stockings and compression pumps at home and admits that she has not been using them regularly at home and states that she will begin using them as ordered upon discharge.  Also discussed importance of elevating legs as much as possible.  Pt states she is followed by a podiatrist on an outpatient basis and told her that leg wraps were not an appropriate treatment option for her at this time due to her cardiac status.  Norva Karvonen RN, MSN Student Re consult if needed, will not follow at this time. Thanks  Hosteen Kienast [employer], CWOCN [phone])

## 2012-09-18 LAB — CBC
Hemoglobin: 9.5 g/dL — ABNORMAL LOW (ref 12.0–15.0)
MCH: 36.8 pg — ABNORMAL HIGH (ref 26.0–34.0)
MCV: 106.2 fL — ABNORMAL HIGH (ref 78.0–100.0)
RBC: 2.58 MIL/uL — ABNORMAL LOW (ref 3.87–5.11)

## 2012-09-18 LAB — BASIC METABOLIC PANEL
BUN: 28 mg/dL — ABNORMAL HIGH (ref 6–23)
CO2: 28 mEq/L (ref 19–32)
Calcium: 8.6 mg/dL (ref 8.4–10.5)
Creatinine, Ser: 1.41 mg/dL — ABNORMAL HIGH (ref 0.50–1.10)
Glucose, Bld: 106 mg/dL — ABNORMAL HIGH (ref 70–99)

## 2012-09-18 MED ORDER — HYDROCODONE-ACETAMINOPHEN 5-325 MG PO TABS
1.0000 | ORAL_TABLET | Freq: Four times a day (QID) | ORAL | Status: DC | PRN
  Administered 2012-09-19 – 2012-09-21 (×4): 1 via ORAL
  Filled 2012-09-18 (×4): qty 1

## 2012-09-18 MED ORDER — DABIGATRAN ETEXILATE MESYLATE 75 MG PO CAPS
75.0000 mg | ORAL_CAPSULE | Freq: Two times a day (BID) | ORAL | Status: DC
  Administered 2012-09-18 – 2012-09-21 (×6): 75 mg via ORAL
  Filled 2012-09-18 (×7): qty 1

## 2012-09-18 NOTE — Progress Notes (Signed)
ANTICOAGULATION CONSULT NOTE - Follow Up Consult  Pharmacy Consult for Heparin Indication: atrial fibrillation  Allergies  Allergen Reactions  . Sulfa Antibiotics Itching  . Tramadol Hcl Itching and Rash    Patient Measurements: Height: 5\' 4"  (162.6 cm) Weight: 151 lb 1.6 oz (68.539 kg) IBW/kg (Calculated) : 54.7 Heparin Dosing Weight: 67  Vital Signs: Temp: 97.6 F (36.4 C) (05/20 0406) Temp src: Axillary (05/20 0406) BP: 124/60 mmHg (05/20 0406) Pulse Rate: 93 (05/20 0406)  Labs:  Recent Labs  09/16/12 1951 09/17/12 0455 09/17/12 1040 09/18/12 0432  HGB 11.6* 10.2*  --  9.5*  HCT 33.2* 29.8*  --  27.4*  PLT 131* 115*  --  110*  HEPARINUNFRC  --   --  0.31 0.28*  CREATININE 1.44* 1.22*  --  1.41*    Estimated Creatinine Clearance: 26.2 ml/min (by C-G formula based on Cr of 1.41).   Medications:  Scheduled:  . atorvastatin  10 mg Oral QHS  . beta carotene w/minerals  1 tablet Oral Daily  . cholecalciferol  1,000 Units Oral Daily  . citalopram  20 mg Oral Daily  . fentaNYL  100 mcg Transdermal Q72H  . ferrous sulfate  325 mg Oral Q breakfast  . folic acid  1 mg Oral Daily  . furosemide  20 mg Oral BID  . isosorbide mononitrate  15 mg Oral Daily  . leflunomide  20 mg Oral Daily  . levothyroxine  25 mcg Oral QAC breakfast  . lisinopril  20 mg Oral Daily  . metoprolol tartrate  100 mg Oral Daily  . metoprolol  50 mg Oral QHS  . multivitamin with minerals  1 tablet Oral Daily  . omega-3 acid ethyl esters  1 g Oral QHS  . pantoprazole  40 mg Oral Daily  . piperacillin-tazobactam (ZOSYN)  IV  3.375 g Intravenous Q8H  . potassium chloride  20 mEq Oral Daily  . predniSONE  5 mg Oral Q breakfast  . sodium chloride  3 mL Intravenous Q12H  . sodium chloride  3 mL Intravenous Q12H  . [START ON 09/19/2012] vancomycin  1,000 mg Intravenous Q48H  . vitamin B-12  500 mcg Oral BID  . zinc sulfate  220 mg Oral Daily   Infusions:  . heparin 950 Units/hr (09/18/12  0402)    Assessment: 79 yo F on heparin for afib.  Pt on Pradaxa PTA but it is being held pending possible need for surgical intervention.  Heparin level < goal on 950 units/hr.  Noted Hgb drop 11.6 >> 9.5 but no bleeding noted.  Goal of Therapy:  Heparin level 0.3-0.7 units/ml   Plan:  Increase heparin to 1050 units/hr. Follow-up with heparin level and CBC in AM. Follow-up restart of Pradaxa when able.  [employer], Pharm.D., BCPS Clinical Pharmacist Pager [phone] 09/18/2012 10:10 AM

## 2012-09-18 NOTE — Progress Notes (Signed)
[facility] PROGRESS NOTE  Nicole Roberts LKG:[id number] DOB: [birth year] DOA: 09/16/2012 PCP: [name], MD  Assessment/Plan: 1-Cellulitis of the right lower extremity: Continue with IV zosyn and Vancomycin day 2. Wound care consulted. Patient should keep leg elevated. Doppler negative for DVT. WBC slightly increase today, redness decrease, follow WBC trend. Will order Vicodin for pain.  2-Atrial fibrillation: Continue with metoprolol.  transition to pradaxa today. Monitor Hb.  3-Rheumatoid arthritis - continue home medications. Prednisone,  4-Chronic kidney disease: Last Cr per records at 1.1. Cr increase today to 1.4. Will discontinue lisinopril. Repeat kidney function in am. Will ask pharmacy to adjust vancomycin for renal function. Monitor on lasix.  5-History of CHF and history of sick sinus syndrome status post pacemaker placement: Continue with lasix, Imdur.  6-History of hypothyroidism and hyperlipidemia - continue home medications.  TSH  1.05 7-Chronic anemia - monitor on anticoagulation. Patient denies melena or hematochezia.  8-Thrombocytopenia: probably secondary to infection. Follow trend.    Code Status: Full Family Communication: Care discussed with patient.  Disposition Plan: to be determine.    Consultants:  None.   Procedures:  LE Doppler pending.   Antibiotics:  Zosyn 5-19  Vancomycin 5-19  HPI/Subjective: Still with pain, redness has decrease.   Objective: Filed Vitals:   09/17/12 2006 09/18/12 0406 09/18/12 1012 09/18/12 1430  BP: 114/54 124/60 124/52 128/67  Pulse: 96 93 89 65  Temp: 98.4 F (36.9 C) 97.6 F (36.4 C)  98.1 F (36.7 C)  TempSrc: Oral Axillary  Oral  Resp:    18  Height:      Weight:  68.539 kg (151 lb 1.6 oz)    SpO2: 100% 100%  99%    Intake/Output Summary (Last 24 hours) at 09/18/12 1546 Last data filed at 09/18/12 1500  Gross per 24 hour  Intake    123 ml  Output   1000 ml  Net   -877 ml   Filed Weights    09/16/12 2349 09/18/12 0406  Weight: 67.903 kg (149 lb 11.2 oz) 68.539 kg (151 lb 1.6 oz)    Exam:   General:  Very pleasant, sitting in bed, eating breakfast  Cardiovascular: S 1., S 2 IRR  Respiratory: CTA  Abdomen: BS present, soft, NT  Musculoskeletal: Right LE with swelling, edema and redness.   Data Reviewed: Basic Metabolic Panel:  Recent Labs Lab 09/16/12 1951 09/17/12 0455 09/18/12 0432  NA 138 137 139  K 4.5 4.0 4.7  CL 99 102 103  CO2 29 24 28   GLUCOSE 106* 91 106*  BUN 38* 34* 28*  CREATININE 1.44* 1.22* 1.41*  CALCIUM 9.2 8.2* 8.6   Liver Function Tests:  Recent Labs Lab 09/17/12 0455  AST 21  ALT 38*  ALKPHOS 39  BILITOT 1.1  PROT 5.4*  ALBUMIN 2.9*   No results found for this basename: LIPASE, AMYLASE,  in the last 168 hours No results found for this basename: AMMONIA,  in the last 168 hours CBC:  Recent Labs Lab 09/16/12 1951 09/17/12 0455 09/18/12 0432  WBC 12.2* 10.7* 14.8*  NEUTROABS 10.8* 9.6*  --   HGB 11.6* 10.2* 9.5*  HCT 33.2* 29.8* 27.4*  MCV 105.1* 105.7* 106.2*  PLT 131* 115* 110*   Cardiac Enzymes: No results found for this basename: CKTOTAL, CKMB, CKMBINDEX, TROPONINI,  in the last 168 hours BNP (last 3 results)  Recent Labs  03/19/12 1009  PROBNP 4164.0*   CBG: No results found for this basename: GLUCAP,  in the  last 168 hours  No results found for this or any previous visit (from the past 240 hour(s)).   Studies: No results found.  Scheduled Meds: . atorvastatin  10 mg Oral QHS  . beta carotene w/minerals  1 tablet Oral Daily  . cholecalciferol  1,000 Units Oral Daily  . citalopram  20 mg Oral Daily  . dabigatran  75 mg Oral Q12H  . fentaNYL  100 mcg Transdermal Q72H  . ferrous sulfate  325 mg Oral Q breakfast  . folic acid  1 mg Oral Daily  . furosemide  20 mg Oral BID  . isosorbide mononitrate  15 mg Oral Daily  . leflunomide  20 mg Oral Daily  . levothyroxine  25 mcg Oral QAC breakfast  .  metoprolol tartrate  100 mg Oral Daily  . metoprolol  50 mg Oral QHS  . multivitamin with minerals  1 tablet Oral Daily  . omega-3 acid ethyl esters  1 g Oral QHS  . pantoprazole  40 mg Oral Daily  . piperacillin-tazobactam (ZOSYN)  IV  3.375 g Intravenous Q8H  . predniSONE  5 mg Oral Q breakfast  . sodium chloride  3 mL Intravenous Q12H  . sodium chloride  3 mL Intravenous Q12H  . [START ON 09/19/2012] vancomycin  1,000 mg Intravenous Q48H  . vitamin B-12  500 mcg Oral BID  . zinc sulfate  220 mg Oral Daily   Continuous Infusions:    Principal Problem:   Cellulitis Active Problems:   HYPOTHYROIDISM   Rheumatoid arthritis   PACEMAKER-[employer]   Atrial fibrillation   CKD (chronic kidney disease)    Time spent: 25 minutes.     Nicole Roberts  [facility] Pager [phone]. If 7PM-7AM, please contact night-coverage at [email or web address], password [facility] 09/18/2012, 3:46 PM  LOS: 2 days

## 2012-09-18 NOTE — Progress Notes (Signed)
Fl2 has been completed along with a PASARR- will be placed on chart for MD's signature. Resident of [facility] with plan for increase in level of care to SNF when medically stable.  Patient is requesting [facility];  Fl2 sent to [facility] [place] for review by Unk Lightning, LCSW.   Lorri Frederick. [place]  [phone]

## 2012-09-19 DIAGNOSIS — I517 Cardiomegaly: Secondary | ICD-10-CM

## 2012-09-19 LAB — BASIC METABOLIC PANEL
CO2: 24 mEq/L (ref 19–32)
Calcium: 9 mg/dL (ref 8.4–10.5)
Creatinine, Ser: 1.23 mg/dL — ABNORMAL HIGH (ref 0.50–1.10)
GFR calc Af Amer: 44 mL/min — ABNORMAL LOW (ref >90)

## 2012-09-19 LAB — CBC
MCH: 36.3 pg — ABNORMAL HIGH (ref 26.0–34.0)
MCHC: 33.8 g/dL (ref 30.0–36.0)
MCV: 107.3 fL — ABNORMAL HIGH (ref 78.0–100.0)
Platelets: 104 10*3/uL — ABNORMAL LOW (ref 150–400)
RDW: 17.5 % — ABNORMAL HIGH (ref 11.5–15.5)

## 2012-09-19 LAB — CLOSTRIDIUM DIFFICILE BY PCR: Toxigenic C. Difficile by PCR: NEGATIVE

## 2012-09-19 MED ORDER — MORPHINE SULFATE 2 MG/ML IJ SOLN
0.5000 mg | INTRAMUSCULAR | Status: DC | PRN
  Administered 2012-09-20: 0.5 mg via INTRAVENOUS
  Filled 2012-09-19: qty 1

## 2012-09-19 MED ORDER — LOPERAMIDE HCL 2 MG PO CAPS
2.0000 mg | ORAL_CAPSULE | ORAL | Status: DC | PRN
  Administered 2012-09-19 – 2012-09-20 (×3): 2 mg via ORAL
  Filled 2012-09-19 (×3): qty 1

## 2012-09-19 MED ORDER — ONDANSETRON HCL 4 MG PO TABS
4.0000 mg | ORAL_TABLET | Freq: Every day | ORAL | Status: DC
  Administered 2012-09-20 – 2012-09-21 (×2): 4 mg via ORAL
  Filled 2012-09-19 (×2): qty 1

## 2012-09-19 NOTE — Progress Notes (Addendum)
[facility] PROGRESS NOTE  Nicole Roberts ZOX:[id number] DOB: 12/23/1921 DOA: 09/16/2012 PCP: [name], MD  Assessment/Plan:  Cellulitis of the right lower extremity still red, painful and swollen  -  Continue with IV zosyn and Vancomycin day 3.  -  Continue elevation -  Doppler negative for DVT.  -  WBC trending down  Diarrhea, likely side effect of antibiotics -  C. Diff neg -  Start imodium  Atrial fibrillation: Continue with metoprolol and pradaxa.  HR and hgb stable.   Rheumatoid arthritis -stable. Continue Prednisone  Chronic kidney disease:   -  Creatinine trended up to 1.4 yesterday, but down to 1.2 and BUN trending down.  -  Continue to hold ACEI for now -  Continue lasix  History of CHF and history of sick sinus syndrome status post pacemaker placement:   -  Continue with lasix, Imdur, beta blocker  -  Restart ACEI when creatinine tolerates  History of hypothyroidism and hyperlipidemia - stable.  continue home medications.  TSH  1.05 Chronic anemia of chronic disease - monitor on anticoagulation.  -  Patient denies melena or hematochezia.  Thrombocytopenia:  probably secondary to infection vs. Zosyn.  Follow trend. If still decreasing change zosyn to cefepime.    Code Status: Full Family Communication: Care discussed with patient.  Disposition Plan:  Pending medically improved.    Consultants:  None.   Procedures:  LE Doppler neg.   Antibiotics:  Zosyn 5-19  Vancomycin 5-19  HPI/Subjective:  Patient states that she continues to have pain in the right leg which is improved. She has nausea after taking her morning meds.  Denies SOB, CP, vomiting.  Has persistent redness and swelling of the right leg.   Objective: Filed Vitals:   09/18/12 1012 09/18/12 1430 09/18/12 2138 09/19/12 0557  BP: 124/52 128/67 162/81 146/82  Pulse: 89 65 101 92  Temp:  98.1 F (36.7 C)  98.4 F (36.9 C)  TempSrc:  Oral Oral Oral  Resp:  18    Height:       Weight:      SpO2:  99% 100% 98%    Intake/Output Summary (Last 24 hours) at 09/19/12 0926 Last data filed at 09/19/12 0900  Gross per 24 hour  Intake    783 ml  Output   1025 ml  Net   -242 ml   Filed Weights   09/16/12 2349 09/18/12 0406  Weight: 67.903 kg (149 lb 11.2 oz) 68.539 kg (151 lb 1.6 oz)    Exam:   General:  CF, no acute distress, resting in bed  Cardiovascular:  IRRR, no mrg, 2+ radial pulses   Respiratory: CTAB  Abdomen: BS present, soft, NT  Musculoskeletal: Right LE with warmth and redness from ankle to the mid-shin.  3+ RLE swelling and 2+ LLE swelling.    Data Reviewed: Basic Metabolic Panel:  Recent Labs Lab 09/16/12 1951 09/17/12 0455 09/18/12 0432 09/19/12 0425  NA 138 137 139 138  K 4.5 4.0 4.7 4.6  CL 99 102 103 101  CO2 29 24 28 24   GLUCOSE 106* 91 106* 99  BUN 38* 34* 28* 22  CREATININE 1.44* 1.22* 1.41* 1.23*  CALCIUM 9.2 8.2* 8.6 9.0   Liver Function Tests:  Recent Labs Lab 09/17/12 0455  AST 21  ALT 38*  ALKPHOS 39  BILITOT 1.1  PROT 5.4*  ALBUMIN 2.9*   No results found for this basename: LIPASE, AMYLASE,  in the last 168 hours No results  found for this basename: AMMONIA,  in the last 168 hours CBC:  Recent Labs Lab 09/16/12 1951 09/17/12 0455 09/18/12 0432 09/19/12 0425  WBC 12.2* 10.7* 14.8* 10.4  NEUTROABS 10.8* 9.6*  --   --   HGB 11.6* 10.2* 9.5* 9.5*  HCT 33.2* 29.8* 27.4* 28.1*  MCV 105.1* 105.7* 106.2* 107.3*  PLT 131* 115* 110* 104*   Cardiac Enzymes: No results found for this basename: CKTOTAL, CKMB, CKMBINDEX, TROPONINI,  in the last 168 hours BNP (last 3 results)  Recent Labs  03/19/12 1009  PROBNP 4164.0*   CBG: No results found for this basename: GLUCAP,  in the last 168 hours  No results found for this or any previous visit (from the past 240 hour(s)).   Studies: No results found.  Scheduled Meds: . atorvastatin  10 mg Oral QHS  . beta carotene w/minerals  1 tablet Oral Daily   . cholecalciferol  1,000 Units Oral Daily  . citalopram  20 mg Oral Daily  . dabigatran  75 mg Oral Q12H  . fentaNYL  100 mcg Transdermal Q72H  . ferrous sulfate  325 mg Oral Q breakfast  . folic acid  1 mg Oral Daily  . furosemide  20 mg Oral BID  . isosorbide mononitrate  15 mg Oral Daily  . leflunomide  20 mg Oral Daily  . levothyroxine  25 mcg Oral QAC breakfast  . metoprolol tartrate  100 mg Oral Daily  . metoprolol  50 mg Oral QHS  . multivitamin with minerals  1 tablet Oral Daily  . omega-3 acid ethyl esters  1 g Oral QHS  . pantoprazole  40 mg Oral Daily  . piperacillin-tazobactam (ZOSYN)  IV  3.375 g Intravenous Q8H  . predniSONE  5 mg Oral Q breakfast  . sodium chloride  3 mL Intravenous Q12H  . sodium chloride  3 mL Intravenous Q12H  . vancomycin  1,000 mg Intravenous Q48H  . vitamin B-12  500 mcg Oral BID  . zinc sulfate  220 mg Oral Daily   Continuous Infusions:    Principal Problem:   Cellulitis Active Problems:   HYPOTHYROIDISM   Rheumatoid arthritis   PACEMAKER-[employer]   Atrial fibrillation   CKD (chronic kidney disease)    Time spent: 25 minutes.     [name]  [facility] Pager [phone]. If 7PM-7AM, please contact night-coverage at [email or web address], password [facility] 09/19/2012, 9:26 AM  LOS: 3 days

## 2012-09-19 NOTE — Discharge Summary (Addendum)
Physician Discharge Summary  Nicole Roberts JYN:[id number] DOB: 07-Mar-1922 DOA: 09/16/2012  PCP: [name], MD  Admit date: 09/16/2012 Discharge date: 09/21/2012  Recommendations for Outpatient Follow-up:  1. Physical therapy and occupational therapy to regain strength and endurance 2. Examination by primary care physician within 5 days of discharge 3. Continue elevation of bilateral legs, particularly the right leg above the level of the heart when resting 4. Compression stockings 5. Expect some diarrhea with antibiotics.  Tested C.diff negative here so may use imodium as needed.   6. Continue antibiotics for 9 more days, last day 09/30/12.  If cellulitis resolves, PCP may discontinue early at follow up appointment.    Discharge Diagnoses:  Principal Problem:   Cellulitis Active Problems:   HYPOTHYROIDISM   Rheumatoid arthritis   PACEMAKER-[employer]   Atrial fibrillation   CKD (chronic kidney disease)   Discharge Condition: stable, improved  Diet recommendation: healthy heart  Wt Readings from Last 3 Encounters:  09/21/12 62.596 kg (138 lb)  08/06/12 67.314 kg (148 lb 6.4 oz)  07/04/12 65.772 kg (145 lb)    History of present illness:   Nicole Roberts is a 79 y.o. female with known history of rheumatoid arthritis, atrial fibrillation on Pradaxa, 6 sinus syndrome status post pacemaker placement, hyperlipidemia, hypothyroidism, chronic kidney disease started experiencing pain and redness in her right lower extremity since morning today. The pain and describes it worsened. Initially started off at the anterior shin and slowly enclosed the whole lower part of her leg. Patient denies any fever chills. At this time patient has been admitted for cellulitis of the lower extremity. Patient has history of chronic lymphedema of both lower extremity. Patient did go to her podiatrist last week due to fluid seeping out. Podiatrist at that time did not want to place her on any  wraps because of history of CHF. Patient denies any trauma or insect bite. Patient still has some fluid draining from the leg which does not look purulent. Patient at this time does not look toxic and does not have any features to suggest compartment syndrome.  Hospital Course:   Cellulitis of the right lower extremity still red, painful and swollen, but less swollen today  - Completed 5 days of IV zosyn and Vancomycin - Encouraged elevation  - Doppler negative for DVT.  - WBC trending down -  Swelling decreasing -  Transition to doxycycline and cipro.  Will avoid clindamycin due to patient already having some loose stools.  On immunosuppressive steroids so needs pseudomonal coverage.  Good MRSA and Strep coverage with this regimen.  Diarrhea, likely side effect of antibiotics  - C. Diff neg  - Continue prn imodium   Atrial fibrillation: Continue with metoprolol and pradaxa. HR and hgb stable.  Rheumatoid arthritis -stable. Continue Prednisone  Chronic kidney disease: Baseline creatinine 1. Creatinine trending down.  Held ACEI.  Should have repeat BMP in 1 week by primary care doctor and restart ACEI at that time if BP tolerates.  History of CHF and history of sick sinus syndrome status post pacemaker placement:  Continue with lasix, Imdur, beta blocker.  PCP to check BMP for creatinine in 1 week and restart ACEI if creatinine back to baseline and BP tolerates.     History of hypothyroidism and hyperlipidemia - stable. continue home medications. TSH 1.05  Chronic anemia of chronic disease - Patient denies melena or hematochezia.  hgb stable.  Defer further management to PCP. Leukocytosis, resolved.  Thrombocytopenia: probably secondary to infection and  resolving  Consultants:  None.  Procedures:  LE Doppler neg.  Antibiotics:  Zosyn 5-19 > 5/23 Vancomycin 5-19 > 5/23   Discharge Exam: Filed Vitals:   09/21/12 0947  BP: 131/73  Pulse: 75  Temp:   Resp:    Filed Vitals:    09/20/12 2025 09/21/12 0542 09/21/12 0751 09/21/12 0947  BP: 126/68 149/70 107/66 131/73  Pulse: 94 81 102 75  Temp: 98.2 F (36.8 C) 98.9 F (37.2 C)    TempSrc: Axillary Oral    Resp: 19     Height:      Weight:  62.596 kg (138 lb)    SpO2: 99% 97%      General: CF, no acute distress, resting in bed  Cardiovascular: IRRR, no mrg, 2+ radial pulses  Respiratory: CTAB  Abdomen: BS present, soft, NT  Musculoskeletal: Right LE with stable warmth and redness from foot to below the knee.  TTP.  2+ RLE swelling and 1+ LLE swelling.    Discharge Instructions      Discharge Orders   Future Orders Complete By Expires     (HEART FAILURE PATIENTS) Call MD:  Anytime you have any of the following symptoms: 1) 3 pound weight gain in 24 hours or 5 pounds in 1 week 2) shortness of breath, with or without a dry hacking cough 3) swelling in the hands, feet or stomach 4) if you have to sleep on extra pillows at night in order to breathe.  As directed     Call MD for:  difficulty breathing, headache or visual disturbances  As directed     Call MD for:  extreme fatigue  As directed     Call MD for:  hives  As directed     Call MD for:  persistant dizziness or light-headedness  As directed     Call MD for:  persistant nausea and vomiting  As directed     Call MD for:  severe uncontrolled pain  As directed     Call MD for:  temperature >100.4  As directed     Diet - low sodium heart healthy  As directed     Discharge instructions  As directed     Comments:      You were hospitalized with cellulitis, or skin infection of the right leg.  You were treated with IV antibiotics for 5 days and should complete a 14 day course of antibiotics unless directed to stop earlier by your primary care doctor.  Please continue elevate your leg and use TED hose while awake to help the infection drain out.  Because you were somewhat dehydrated, your lisinopril was held.  You will need to have your bloodwork rechecked in 1  week by your doctor and they will tell you whether it is safe to restart your lisinopril.  Your doctor should also examine your leg to make sure it is improving.  If you develop fevers, chills, or signs of worsening infection, please return to the hospital.  It is normal to have some mild diarrhea when on antibiotics, but if you start having copious watery stools, you will need to be tested again for an infectious type of diarrhea called C. Diff.  Your doctor can perform this test, or if you are feeling dehydrated, you may return to the hospital for repeat testing.  For now, it is okay to use imodium.    Increase activity slowly  As directed  Medication List    STOP taking these medications       lisinopril 20 MG tablet  Commonly known as:  PRINIVIL,ZESTRIL      TAKE these medications       atorvastatin 10 MG tablet  Commonly known as:  LIPITOR  Take 10 mg by mouth at bedtime.     beta carotene w/minerals tablet  Take 1 tablet by mouth daily.     ciprofloxacin 500 MG tablet  Commonly known as:  CIPRO  Take 1 tablet (500 mg total) by mouth 2 (two) times daily.     citalopram 20 MG tablet  Commonly known as:  CELEXA  Take 20 mg by mouth daily.     Co Q10 100 MG Tabs  Take 100 mg by mouth at bedtime.     dabigatran 75 MG Caps  Commonly known as:  PRADAXA  Take 75 mg by mouth every 12 (twelve) hours.     diphenoxylate-atropine 2.5-0.025 MG per tablet  Commonly known as:  LOMOTIL  Take 1 tablet by mouth 4 (four) times daily as needed for diarrhea or loose stools.     doxycycline 100 MG tablet  Commonly known as:  VIBRA-TABS  Take 1 tablet (100 mg total) by mouth every 12 (twelve) hours.     fentaNYL 100 MCG/HR  Commonly known as:  DURAGESIC - dosed mcg/hr  Place 1 patch (100 mcg total) onto the skin every 3 (three) days.     ferrous sulfate 325 (65 FE) MG tablet  Take 325 mg by mouth daily with breakfast.     Fish Oil 1000 MG Cpdr  Take 1,000 mg by mouth at  bedtime.     folic acid 400 MCG tablet  Commonly known as:  FOLVITE  Take 400 mcg by mouth daily.     furosemide 20 MG tablet  Commonly known as:  LASIX  Take 1 tablet (20 mg total) by mouth 2 (two) times daily.     HYDROcodone-acetaminophen 5-325 MG per tablet  Commonly known as:  NORCO/VICODIN  Take 1 tablet by mouth every 6 (six) hours as needed.     isosorbide mononitrate 15 mg Tb24  Commonly known as:  IMDUR  Take 15 mg by mouth daily.     Lecithin 400 MG Caps  Take 400 mg by mouth daily.     leflunomide 20 MG tablet  Commonly known as:  ARAVA  Take 20 mg by mouth daily.     levothyroxine 25 MCG tablet  Commonly known as:  SYNTHROID, LEVOTHROID  TAKE 1 TABLET DAILY     loperamide 2 MG capsule  Commonly known as:  IMODIUM  Take 1 capsule (2 mg total) by mouth as needed for diarrhea or loose stools.     metoprolol 100 MG tablet  Commonly known as:  LOPRESSOR  Take 50-100 mg by mouth 2 (two) times daily. Take 1 tab in the morning and 1/2 tab in the evening     multivitamin tablet  Take 1 tablet by mouth daily.     nitroGLYCERIN 0.4 MG SL tablet  Commonly known as:  NITROSTAT  Place 0.4 mg under the tongue every 5 (five) minutes x 3 doses as needed for chest pain.     ondansetron 4 MG tablet  Commonly known as:  ZOFRAN  Take 4 mg by mouth every 8 (eight) hours as needed for nausea. For nausea     pantoprazole 40 MG tablet  Commonly known as:  PROTONIX  Take 40 mg by mouth daily. TAKE 1 TABLET DAILY     potassium chloride 10 MEQ tablet  Commonly known as:  K-DUR  Take 20 mEq by mouth daily.     predniSONE 5 MG tablet  Commonly known as:  DELTASONE  Take 5 mg by mouth daily.     TYLENOL 500 MG tablet  Generic drug:  acetaminophen  Take 500 mg by mouth every 6 (six) hours as needed for pain. For pain     vitamin B-12 500 MCG tablet  Commonly known as:  CYANOCOBALAMIN  Take 500 mcg by mouth 2 (two) times daily.     Vitamin D3 1000 UNITS Caps  Take  1,000 Units by mouth daily.     zinc sulfate 220 MG capsule  Take 220 mg by mouth daily.         The results of significant diagnostics from this hospitalization (including imaging, microbiology, ancillary and laboratory) are listed below for reference.    Significant Diagnostic Studies: No results found.  Microbiology: Recent Results (from the past 240 hour(s))  CLOSTRIDIUM DIFFICILE BY PCR     Status: None   Collection Time    09/19/12  2:09 PM      Result Value Range Status   C difficile by pcr NEGATIVE  NEGATIVE Final     Labs: Basic Metabolic Panel:  Recent Labs Lab 09/16/12 1951 09/17/12 0455 09/18/12 0432 09/19/12 0425  NA 138 137 139 138  K 4.5 4.0 4.7 4.6  CL 99 102 103 101  CO2 29 24 28 24   GLUCOSE 106* 91 106* 99  BUN 38* 34* 28* 22  CREATININE 1.44* 1.22* 1.41* 1.23*  CALCIUM 9.2 8.2* 8.6 9.0   Liver Function Tests:  Recent Labs Lab 09/17/12 0455  AST 21  ALT 38*  ALKPHOS 39  BILITOT 1.1  PROT 5.4*  ALBUMIN 2.9*   No results found for this basename: LIPASE, AMYLASE,  in the last 168 hours No results found for this basename: AMMONIA,  in the last 168 hours CBC:  Recent Labs Lab 09/16/12 1951 09/17/12 0455 09/18/12 0432 09/19/12 0425 09/20/12 0457 09/21/12 0505  WBC 12.2* 10.7* 14.8* 10.4 8.2 8.6  NEUTROABS 10.8* 9.6*  --   --   --   --   HGB 11.6* 10.2* 9.5* 9.5* 9.1* 9.3*  HCT 33.2* 29.8* 27.4* 28.1* 27.0* 27.8*  MCV 105.1* 105.7* 106.2* 107.3* 106.7* 106.5*  PLT 131* 115* 110* 104* 117* 127*   Cardiac Enzymes: No results found for this basename: CKTOTAL, CKMB, CKMBINDEX, TROPONINI,  in the last 168 hours BNP: BNP (last 3 results)  Recent Labs  03/19/12 1009  PROBNP 4164.0*   CBG: No results found for this basename: GLUCAP,  in the last 168 hours  Time coordinating discharge: 45 minutes  Signed:  Kamren Heskett  [facility] 09/21/2012, 11:56 AM

## 2012-09-20 DIAGNOSIS — R609 Edema, unspecified: Secondary | ICD-10-CM

## 2012-09-20 LAB — CBC
HCT: 27 % — ABNORMAL LOW (ref 36.0–46.0)
MCHC: 33.7 g/dL (ref 30.0–36.0)
RDW: 17.1 % — ABNORMAL HIGH (ref 11.5–15.5)

## 2012-09-20 NOTE — Progress Notes (Signed)
[facility] PROGRESS NOTE  Nicole Roberts ZOX:[id number] DOB: [birth year] DOA: 09/16/2012 PCP: [name], MD  Assessment/Plan:  Cellulitis of the right lower extremity still red, painful and swollen, stable from yesterday and possibly more swollen today -  Continue with IV zosyn and Vancomycin day 4 -  Encouraged elevation -  Doppler negative for DVT.  -  WBC trending down  Diarrhea, likely side effect of antibiotics -  C. Diff neg -  Continue prn imodium  Atrial fibrillation: Continue with metoprolol and pradaxa.  HR and hgb stable.    Rheumatoid arthritis -stable. Continue Prednisone   Chronic kidney disease:  Baseline creatinine 1. -  Creatinine trending down.  Repeat in AM -  Continue to hold ACEI for now -  Continue lasix  History of CHF and history of sick sinus syndrome status post pacemaker placement:   -  Continue with lasix, Imdur, beta blocker  -  Restart ACEI when creatinine tolerates  History of hypothyroidism and hyperlipidemia - stable.  continue home medications.  TSH  1.05  Chronic anemia of chronic disease - monitor on anticoagulation.  -  Patient denies melena or hematochezia.   Leukocytosis, resolved.  Thrombocytopenia:  probably secondary to infection and recovering  Code Status: Full Family Communication: Care discussed with patient.  Disposition Plan:  Pending medically improved.  Possibly tomorrow.    Consultants:  None.   Procedures:  LE Doppler neg.   Antibiotics:  Zosyn 5-19  Vancomycin 5-19  HPI/Subjective:  Patient states that she has increased pain in the right leg and it feels more swollen today.  She slept on her right side last night.  Denies fevers, CP, SOB, nausea.  Still having liquid stools, but denies abdominal pain, blood in stools.    Objective: Filed Vitals:   09/19/12 0557 09/19/12 1400 09/19/12 2008 09/20/12 0357  BP: 146/82 111/64 137/85 135/92  Pulse: 92 87 102 96  Temp: 98.4 F (36.9 C) 98.1 F  (36.7 C) 99.9 F (37.7 C) 98.9 F (37.2 C)  TempSrc: Oral Oral Oral Oral  Resp:  18 20 18   Height:      Weight:    68.72 kg (151 lb 8 oz)  SpO2: 98% 100% 98% 96%    Intake/Output Summary (Last 24 hours) at 09/20/12 0739 Last data filed at 09/19/12 1818  Gross per 24 hour  Intake    960 ml  Output    800 ml  Net    160 ml   Filed Weights   09/16/12 2349 09/18/12 0406 09/20/12 0357  Weight: 67.903 kg (149 lb 11.2 oz) 68.539 kg (151 lb 1.6 oz) 68.72 kg (151 lb 8 oz)    Exam:   General:  CF, no acute distress, resting in bed  Cardiovascular:  IRRR, no mrg, 2+ radial pulses   Respiratory: CTAB  Abdomen: BS present, soft, NT  Musculoskeletal: Right LE with stable warmth and redness from ankle to the mid-shin.  3+ RLE swelling and possibly mildly more swollen and 2+ LLE swelling.    Data Reviewed: Basic Metabolic Panel:  Recent Labs Lab 09/16/12 1951 09/17/12 0455 09/18/12 0432 09/19/12 0425  NA 138 137 139 138  K 4.5 4.0 4.7 4.6  CL 99 102 103 101  CO2 29 24 28 24   GLUCOSE 106* 91 106* 99  BUN 38* 34* 28* 22  CREATININE 1.44* 1.22* 1.41* 1.23*  CALCIUM 9.2 8.2* 8.6 9.0   Liver Function Tests:  Recent Labs Lab 09/17/12 0455  AST 21  ALT 38*  ALKPHOS 39  BILITOT 1.1  PROT 5.4*  ALBUMIN 2.9*   No results found for this basename: LIPASE, AMYLASE,  in the last 168 hours No results found for this basename: AMMONIA,  in the last 168 hours CBC:  Recent Labs Lab 09/16/12 1951 09/17/12 0455 09/18/12 0432 09/19/12 0425 09/20/12 0457  WBC 12.2* 10.7* 14.8* 10.4 8.2  NEUTROABS 10.8* 9.6*  --   --   --   HGB 11.6* 10.2* 9.5* 9.5* 9.1*  HCT 33.2* 29.8* 27.4* 28.1* 27.0*  MCV 105.1* 105.7* 106.2* 107.3* 106.7*  PLT 131* 115* 110* 104* 117*   Cardiac Enzymes: No results found for this basename: CKTOTAL, CKMB, CKMBINDEX, TROPONINI,  in the last 168 hours BNP (last 3 results)  Recent Labs  03/19/12 1009  PROBNP 4164.0*   CBG: No results found for  this basename: GLUCAP,  in the last 168 hours  Recent Results (from the past 240 hour(s))  CLOSTRIDIUM DIFFICILE BY PCR     Status: None   Collection Time    09/19/12  2:09 PM      Result Value Range Status   C difficile by pcr NEGATIVE  NEGATIVE Final     Studies: No results found.  Scheduled Meds: . atorvastatin  10 mg Oral QHS  . beta carotene w/minerals  1 tablet Oral Daily  . cholecalciferol  1,000 Units Oral Daily  . citalopram  20 mg Oral Daily  . dabigatran  75 mg Oral Q12H  . fentaNYL  100 mcg Transdermal Q72H  . ferrous sulfate  325 mg Oral Q breakfast  . folic acid  1 mg Oral Daily  . furosemide  20 mg Oral BID  . isosorbide mononitrate  15 mg Oral Daily  . leflunomide  20 mg Oral Daily  . levothyroxine  25 mcg Oral QAC breakfast  . metoprolol tartrate  100 mg Oral Daily  . metoprolol  50 mg Oral QHS  . multivitamin with minerals  1 tablet Oral Daily  . omega-3 acid ethyl esters  1 g Oral QHS  . ondansetron  4 mg Oral Daily  . pantoprazole  40 mg Oral Daily  . piperacillin-tazobactam (ZOSYN)  IV  3.375 g Intravenous Q8H  . predniSONE  5 mg Oral Q breakfast  . sodium chloride  3 mL Intravenous Q12H  . sodium chloride  3 mL Intravenous Q12H  . vancomycin  1,000 mg Intravenous Q48H  . vitamin B-12  500 mcg Oral BID  . zinc sulfate  220 mg Oral Daily   Continuous Infusions:    Principal Problem:   Cellulitis Active Problems:   HYPOTHYROIDISM   Rheumatoid arthritis   PACEMAKER-[employer]   Atrial fibrillation   CKD (chronic kidney disease)    Time spent: 25 minutes.     [name]  [facility] Pager [phone]. If 7PM-7AM, please contact night-coverage at [email or web address], password [facility] 09/20/2012, 7:39 AM  LOS: 4 days

## 2012-09-20 NOTE — Progress Notes (Signed)
Utilization review completed.  

## 2012-09-20 NOTE — Progress Notes (Signed)
ANTIBIOTIC CONSULT NOTE - FOLLOW UP  Pharmacy Consult for Vancomycin / Zosyn Indication: cellulitits  Allergies  Allergen Reactions  . Sulfa Antibiotics Itching  . Tramadol Hcl Itching and Rash    Patient Measurements: Height: 5\' 4"  (162.6 cm) Weight: 151 lb 8 oz (68.72 kg) IBW/kg (Calculated) : 54.7   Vital Signs: Temp: 98.9 F (37.2 C) (05/22 0357) Temp src: Oral (05/22 0357) BP: 135/92 mmHg (05/22 0357) Pulse Rate: 96 (05/22 0357) Intake/Output from previous day: 05/21 0701 - 05/22 0700 In: 1180 [P.O.:960; IV Piggyback:100] Out: 800 [Urine:800] Intake/Output from this shift:    Labs:  Recent Labs  09/18/12 0432 09/19/12 0425 09/20/12 0457  WBC 14.8* 10.4 8.2  HGB 9.5* 9.5* 9.1*  PLT 110* 104* 117*  CREATININE 1.41* 1.23*  --    Estimated Creatinine Clearance: 30.1 ml/min (by C-G formula based on Cr of 1.23). No results found for this basename: VANCOTROUGH, Leodis Binet, VANCORANDOM, GENTTROUGH, GENTPEAK, GENTRANDOM, TOBRATROUGH, TOBRAPEAK, TOBRARND, AMIKACINPEAK, AMIKACINTROU, AMIKACIN,  in the last 72 hours   Microbiology: Recent Results (from the past 720 hour(s))  CLOSTRIDIUM DIFFICILE BY PCR     Status: None   Collection Time    09/19/12  2:09 PM      Result Value Range Status   C difficile by pcr NEGATIVE  NEGATIVE Final    Anti-infectives   Start     Dose/Rate Route Frequency Ordered Stop   09/19/12 0600  vancomycin (VANCOCIN) IVPB 1000 mg/200 mL premix     1,000 mg 200 mL/hr over 60 Minutes Intravenous Every 48 hours 09/17/12 0035     09/17/12 0200  piperacillin-tazobactam (ZOSYN) IVPB 3.375 g     3.375 g 12.5 mL/hr over 240 Minutes Intravenous Every 8 hours 09/17/12 0035     09/17/12 0045  vancomycin (VANCOCIN) 1,250 mg in sodium chloride 0.9 % 250 mL IVPB     1,250 mg 166.7 mL/hr over 90 Minutes Intravenous  Once 09/17/12 0035 09/17/12 0431   09/16/12 2145  clindamycin (CLEOCIN) IVPB 600 mg     600 mg 100 mL/hr over 30 Minutes Intravenous   Once 09/16/12 2142 09/16/12 2351      Assessment: 89yof admitted with RLE cellulitis that is still red and swollen.  WBC wnl, afebrile, renal function is improving.    Goal of Therapy:  Vancomycin trough level 10-15 mcg/ml  Plan:   Continue vancomycin 1gm q48hr - will check a trough with next dose since renal fx is improving Continue zosyn 3.375gm EI   Leota Sauers Pharm.D. CPP, BCPS Clinical Pharmacist [phone] 09/20/2012 9:17 AM

## 2012-09-21 ENCOUNTER — Encounter (HOSPITAL_COMMUNITY): Payer: Self-pay | Admitting: General Practice

## 2012-09-21 DIAGNOSIS — I10 Essential (primary) hypertension: Secondary | ICD-10-CM

## 2012-09-21 DIAGNOSIS — M069 Rheumatoid arthritis, unspecified: Secondary | ICD-10-CM

## 2012-09-21 LAB — VANCOMYCIN, TROUGH: Vancomycin Tr: <5 ug/mL — ABNORMAL LOW (ref 10.0–20.0)

## 2012-09-21 LAB — CBC
MCH: 35.6 pg — ABNORMAL HIGH (ref 26.0–34.0)
Platelets: 127 10*3/uL — ABNORMAL LOW (ref 150–400)
RBC: 2.61 MIL/uL — ABNORMAL LOW (ref 3.87–5.11)

## 2012-09-21 MED ORDER — CIPROFLOXACIN HCL 500 MG PO TABS
500.0000 mg | ORAL_TABLET | Freq: Two times a day (BID) | ORAL | Status: DC
  Filled 2012-09-21 (×3): qty 1

## 2012-09-21 MED ORDER — FUROSEMIDE 20 MG PO TABS: 20.0000 mg | ORAL_TABLET | Freq: Two times a day (BID) | ORAL | Status: DC

## 2012-09-21 MED ORDER — FENTANYL 100 MCG/HR TD PT72: 1.0000 | MEDICATED_PATCH | TRANSDERMAL | Status: DC

## 2012-09-21 MED ORDER — CEPHALEXIN 500 MG PO CAPS
500.0000 mg | ORAL_CAPSULE | Freq: Four times a day (QID) | ORAL | Status: DC
  Filled 2012-09-21 (×5): qty 1

## 2012-09-21 MED ORDER — DOXYCYCLINE HYCLATE 100 MG PO TABS
100.0000 mg | ORAL_TABLET | Freq: Two times a day (BID) | ORAL | Status: DC
  Administered 2012-09-21: 100 mg via ORAL
  Filled 2012-09-21 (×2): qty 1

## 2012-09-21 MED ORDER — CIPROFLOXACIN HCL 500 MG PO TABS
500.0000 mg | ORAL_TABLET | Freq: Every day | ORAL | Status: DC
  Administered 2012-09-21: 500 mg via ORAL
  Filled 2012-09-21 (×2): qty 1

## 2012-09-21 MED ORDER — CIPROFLOXACIN HCL 500 MG PO TABS: 500.0000 mg | ORAL_TABLET | Freq: Two times a day (BID) | ORAL | Status: DC

## 2012-09-21 MED ORDER — CIPROFLOXACIN HCL 500 MG PO TABS
500.0000 mg | ORAL_TABLET | Freq: Every day | ORAL | Status: DC
  Filled 2012-09-21 (×2): qty 1

## 2012-09-21 MED ORDER — LOPERAMIDE HCL 2 MG PO CAPS: 2.0000 mg | ORAL_CAPSULE | ORAL | Status: DC | PRN

## 2012-09-21 MED ORDER — DOXYCYCLINE HYCLATE 100 MG PO TABS: 100.0000 mg | ORAL_TABLET | Freq: Two times a day (BID) | ORAL | Status: DC

## 2012-09-21 MED ORDER — HYDROCODONE-ACETAMINOPHEN 5-325 MG PO TABS: 1.0000 | ORAL_TABLET | Freq: Four times a day (QID) | ORAL | Status: DC | PRN

## 2012-09-21 MED ORDER — VANCOMYCIN HCL 500 MG IV SOLR: 500.0000 mg | INTRAVENOUS | Status: DC

## 2012-09-21 MED ORDER — CLINDAMYCIN HCL 300 MG PO CAPS
300.0000 mg | ORAL_CAPSULE | Freq: Three times a day (TID) | ORAL | Status: DC
  Filled 2012-09-21 (×4): qty 1

## 2012-09-21 NOTE — Progress Notes (Signed)
ANTIBIOTIC CONSULT NOTE - Follow Up  Pharmacy Consult for Vancomycin  Indication: cellulitits  Allergies  Allergen Reactions  . Sulfa Antibiotics Itching  . Tramadol Hcl Itching and Rash    Patient Measurements: Height: 5\' 4"  (162.6 cm) Weight: 138 lb (62.596 kg) IBW/kg (Calculated) : 54.7   Vital Signs: Temp: 98.9 F (37.2 C) (05/23 0542) Temp src: Oral (05/23 0542) BP: 149/70 mmHg (05/23 0542) Pulse Rate: 81 (05/23 0542) Intake/Output from previous day: 05/22 0701 - 05/23 0700 In: 1080 [P.O.:1080] Out: -  Intake/Output from this shift:    Labs:  Recent Labs  09/19/12 0425 09/20/12 0457 09/21/12 0505  WBC 10.4 8.2 8.6  HGB 9.5* 9.1* 9.3*  PLT 104* 117* 127*  CREATININE 1.23*  --   --    Estimated Creatinine Clearance: 27.3 ml/min (by C-G formula based on Cr of 1.23).  Recent Labs  09/21/12 0505  VANCOTROUGH <5.0*     Microbiology: Recent Results (from the past 720 hour(s))  CLOSTRIDIUM DIFFICILE BY PCR     Status: None   Collection Time    09/19/12  2:09 PM      Result Value Range Status   C difficile by pcr NEGATIVE  NEGATIVE Final    Anti-infectives   Start     Dose/Rate Route Frequency Ordered Stop   09/19/12 0600  vancomycin (VANCOCIN) IVPB 1000 mg/200 mL premix     1,000 mg 200 mL/hr over 60 Minutes Intravenous Every 48 hours 09/17/12 0035     09/17/12 0200  piperacillin-tazobactam (ZOSYN) IVPB 3.375 g     3.375 g 12.5 mL/hr over 240 Minutes Intravenous Every 8 hours 09/17/12 0035     09/17/12 0045  vancomycin (VANCOCIN) 1,250 mg in sodium chloride 0.9 % 250 mL IVPB     1,250 mg 166.7 mL/hr over 90 Minutes Intravenous  Once 09/17/12 0035 09/17/12 0431   09/16/12 2145  clindamycin (CLEOCIN) IVPB 600 mg     600 mg 100 mL/hr over 30 Minutes Intravenous  Once 09/16/12 2142 09/16/12 2351      Assessment: 89yof on vancomycin with trough value <5.0.   Goal of Therapy:  Vancomycin trough level 10-15 mcg/ml  Plan:   Change vancomycin to  500 mg IV q24 hours.   Larkin Morelos Pharm.D. Clinical Pharmacist [phone] 09/21/2012 6:58 AM

## 2012-09-21 NOTE — Progress Notes (Signed)
Clinical Social Work Department CLINICAL SOCIAL WORK PLACEMENT NOTE 09/21/2012  Patient:  Nicole Roberts, Nicole Roberts  Account Number:  [id number] Admit date:  09/16/2012  Clinical Social Worker:  Sherald Barge, Theresia Majors  Date/time:  09/17/2012 01:29 PM  Clinical Social Work is seeking post-discharge placement for this patient at the following level of care:   SKILLED NURSING   (*CSW will update this form in Epic as items are completed)   09/17/2012  Patient/family provided with [facility] Department of Clinical Social Work's list of facilities offering this level of care within the geographic area requested by the patient (or if unable, by the patient's family).  09/17/2012  Patient/family informed of their freedom to choose among providers that offer the needed level of care, that participate in Medicare, Medicaid or managed care program needed by the patient, have an available bed and are willing to accept the patient.  09/17/2012  Patient/family informed of [facility]' ownership interest in [facility], as well as of the fact that they are under no obligation to receive care at this facility.  PASARR submitted to EDS on 09/17/2012 PASARR number received from [employer] on 09/17/2012  FL2 transmitted to all facilities in geographic area requested by pt/family on  09/17/2012 FL2 transmitted to all facilities within larger geographic area on 09/17/2012  Patient informed that his/her managed care company has contracts with or will negotiate with  certain facilities, including the following:     Patient/family informed of bed offers received:  09/17/2012 Patient chooses bed at [facility] Physician recommends and patient chooses bed at    Patient to be transferred to [facility] on  09/21/2012 Patient to be transferred to facility by [facility]  The following physician request were entered in Epic:  Sherald Barge, LCSW-A Clinical Social  Worker [phone]

## 2012-09-21 NOTE — Progress Notes (Signed)
DC orders received.  Patient stable with no S/S of distress.  Medication and discharge information reviewed with patient and patient's daughter.  Report called to [facility] and given to Sam.  Patient DC via ambulance to SNF. [place], Mitzi Hansen

## 2012-09-21 NOTE — Progress Notes (Signed)
Clinical Social Work Department BRIEF PSYCHOSOCIAL ASSESSMENT 09/21/2012  Patient:  Nicole Roberts, Nicole Roberts     Account Number:  [id number]     Admit date:  09/16/2012  Clinical Social Worker:  Kirke Shaggy  Date/Time:  09/16/2012 01:19 PM  Referred by:  Physician  Date Referred:  09/16/2012 Referred for  ALF Placement   Other Referral:   Interview type:  Patient Other interview type:    PSYCHOSOCIAL DATA Living Status:  FACILITY Admitted from facility:  [facility] Level of care:   Primary support name:   Primary support relationship to patient:   Degree of support available:    CURRENT CONCERNS Current Concerns  Post-Acute Placement   Other Concerns:    SOCIAL WORK ASSESSMENT / PLAN Met with th pt at bedside to discuss her going to a SNF for PT rehab.  Pt wants to go to [facility], CSW made contact with Tresa Endo and she has accepted this pt.   Assessment/plan status:   Other assessment/ plan:   Information/referral to community resources:    PATIENT'S/FAMILY'S RESPONSE TO PLAN OF CARE: Pt and family are in agreement to go to [facility] SNF.  [facility] will transport this pt to facility.    Sherald Barge, LCSW-A Clinical Social Worker [phone]

## 2012-10-12 ENCOUNTER — Telehealth: Payer: Self-pay | Admitting: Gastroenterology

## 2012-10-12 NOTE — Telephone Encounter (Signed)
Pt has had a 25 pound wt loss in 3 weeks. Daughter-in-law states she has difficulty keeping food down. Requesting pt be seen. Pt scheduled to see Mike Gip PA 10/15/12@10am . Daugher-in-law aware of appt date and time.

## 2012-10-15 ENCOUNTER — Other Ambulatory Visit: Payer: Self-pay | Admitting: Gastroenterology

## 2012-10-15 ENCOUNTER — Telehealth: Payer: Self-pay | Admitting: Gastroenterology

## 2012-10-15 ENCOUNTER — Ambulatory Visit (INDEPENDENT_AMBULATORY_CARE_PROVIDER_SITE_OTHER): Payer: Medicare Other | Admitting: Physician Assistant

## 2012-10-15 ENCOUNTER — Telehealth: Payer: Self-pay | Admitting: Internal Medicine

## 2012-10-15 ENCOUNTER — Encounter: Payer: Self-pay | Admitting: Physician Assistant

## 2012-10-15 VITALS — BP 100/56 | HR 56 | Ht 64.0 in | Wt 126.8 lb

## 2012-10-15 DIAGNOSIS — R1013 Epigastric pain: Secondary | ICD-10-CM

## 2012-10-15 DIAGNOSIS — R634 Abnormal weight loss: Secondary | ICD-10-CM

## 2012-10-15 DIAGNOSIS — R112 Nausea with vomiting, unspecified: Secondary | ICD-10-CM

## 2012-10-15 DIAGNOSIS — R1310 Dysphagia, unspecified: Secondary | ICD-10-CM

## 2012-10-15 MED ORDER — PANTOPRAZOLE SODIUM 40 MG PO TBEC: 40.0000 mg | DELAYED_RELEASE_TABLET | Freq: Two times a day (BID) | ORAL | Status: DC

## 2012-10-15 MED ORDER — ONDANSETRON HCL 4 MG PO TABS: 4.0000 mg | ORAL_TABLET | Freq: Three times a day (TID) | ORAL | Status: DC

## 2012-10-15 NOTE — Telephone Encounter (Signed)
Patient is in rehab and not given calcium while there.  Daughter is very concerned about whether or not she should be taking it.  Please give phone call in this regards.

## 2012-10-15 NOTE — Progress Notes (Signed)
Reviewed and agree with management plan.  Elyon Zoll T. Blakley Michna, MD FACG 

## 2012-10-15 NOTE — Progress Notes (Signed)
Subjective:    Patient ID: Nicole Roberts, female    DOB: 1921-09-17, 78 y.o.   MRN: [id number]  HPI Nicole Roberts is an 82 sure old white female with multiple medical problems who is currently residing in a nursing facility after hospitalization for a cellulitis in may of 2014. She became deconditioned at that time and is therefore rehabilitation . She is known to Dr. Russella Dar from prior endoscopic evaluation. She has history of atrial fibrillation for which she is maintained on per act as a. She does have history of TIA in January of 2013, breast cancer with lumpectomy in 2008, rheumatoid arthritis, pulmonary hypertension, sick sinus syndrome status post pacemaker placement and chronic kidney disease. She comes in now for evaluation of nausea abdominal discomfort and significant weight loss of approximately 25 pounds over the past 3 months. Patient's son says that she's had bouts of nausea and vomiting over the past 8-12 months but has gotten to the point now that she is having symptoms every day. She says her symptoms started just prior to her admission in May. She says her food doesn't taste good and she has a bad taste in her mouth. She's having nausea on a daily basis occasional dry heaves and generally has at least one episode of vomiting or dry heaves every day . She also admits to a burning sensation with swallowing and says she "burns all the way down" appear she does not feel that her food is sticking and has not had regurgitation. She has discomfort in her upper abdomen with eating and has been eating much less than usual. She is not having any ongoing problems with diarrhea constipation Allen or hematochezia. She has been on Protonix long-term. She status post cholecystectomy Her most recent labs 09/21/2012 shows WBC of 8.6 hemoglobin 9.3 hematocrit of 27.8 and platelets of 127, iron studies were done in September of 2013 and she is not iron deficient. She and her family are concerned because she has  so many medications and would like her to get off of whatever is not essential .   Review of Systems  Constitutional: Positive for appetite change, fatigue and unexpected weight change.  HENT: Positive for trouble swallowing.   Eyes: Negative.   Respiratory: Negative.   Cardiovascular: Negative.   Gastrointestinal: Positive for nausea, vomiting and abdominal pain.  Endocrine: Negative.   Genitourinary: Negative.   Musculoskeletal: Negative.   Skin: Negative.   Allergic/Immunologic: Negative.   Neurological: Positive for weakness.  Hematological: Negative.   Psychiatric/Behavioral: Negative.    Outpatient Prescriptions Prior to Visit  Medication Sig Dispense Refill  . acetaminophen (TYLENOL) 500 MG tablet Take 500 mg by mouth every 6 (six) hours as needed for pain. For pain      . atorvastatin (LIPITOR) 10 MG tablet Take 10 mg by mouth at bedtime.      . beta carotene w/minerals (OCUVITE) tablet Take 1 tablet by mouth daily.      . Cholecalciferol (VITAMIN D3) 1000 UNITS CAPS Take 1,000 Units by mouth daily.       . citalopram (CELEXA) 20 MG tablet Take 20 mg by mouth daily.      . dabigatran (PRADAXA) 75 MG CAPS Take 75 mg by mouth every 12 (twelve) hours.      . diphenoxylate-atropine (LOMOTIL) 2.5-0.025 MG per tablet Take 1 tablet by mouth 4 (four) times daily as needed for diarrhea or loose stools.      . fentaNYL (DURAGESIC - DOSED MCG/HR) 100 MCG/HR Place 1  patch (100 mcg total) onto the skin every 3 (three) days.  5 patch  0  . folic acid (FOLVITE) 400 MCG tablet Take 400 mcg by mouth daily.      . furosemide (LASIX) 20 MG tablet Take 1 tablet (20 mg total) by mouth 2 (two) times daily.  30 tablet    . HYDROcodone-acetaminophen (NORCO/VICODIN) 5-325 MG per tablet Take 1 tablet by mouth every 6 (six) hours as needed.  30 tablet  0  . isosorbide mononitrate (IMDUR) 15 mg TB24 Take 15 mg by mouth daily.      [name] leflunomide (ARAVA) 20 MG tablet Take 20 mg by mouth daily.      [name]  levothyroxine (SYNTHROID, LEVOTHROID) 25 MCG tablet TAKE 1 TABLET DAILY  90 tablet  3  . loperamide (IMODIUM) 2 MG capsule Take 1 capsule (2 mg total) by mouth as needed for diarrhea or loose stools.  30 capsule  0  . metoprolol (LOPRESSOR) 100 MG tablet Take 50-100 mg by mouth 2 (two) times daily. Take 1 tab in the morning and 1/2 tab in the evening      . nitroGLYCERIN (NITROSTAT) 0.4 MG SL tablet Place 0.4 mg under the tongue every 5 (five) minutes x 3 doses as needed for chest pain.      . potassium chloride (K-DUR) 10 MEQ tablet Take 20 mEq by mouth daily.      . predniSONE (DELTASONE) 5 MG tablet Take 5 mg by mouth daily.      . vitamin B-12 (CYANOCOBALAMIN) 500 MCG tablet Take 500 mcg by mouth 2 (two) times daily.      [name] zinc sulfate 220 MG capsule Take 220 mg by mouth daily.       . Coenzyme Q10 (CO Q10) 100 MG TABS Take 100 mg by mouth at bedtime.       . ferrous sulfate 325 (65 FE) MG tablet Take 325 mg by mouth daily with breakfast.       . Lecithin 400 MG CAPS Take 400 mg by mouth daily.       . Multiple Vitamin (MULTIVITAMIN) tablet Take 1 tablet by mouth daily.      . Omega-3 Fatty Acids (FISH OIL) 1000 MG CPDR Take 1,000 mg by mouth at bedtime.       . ondansetron (ZOFRAN) 4 MG tablet Take 4 mg by mouth every 8 (eight) hours as needed for nausea. For nausea      . pantoprazole (PROTONIX) 40 MG tablet Take 40 mg by mouth daily. TAKE 1 TABLET DAILY      . ciprofloxacin (CIPRO) 500 MG tablet Take 1 tablet (500 mg total) by mouth 2 (two) times daily.      [name] doxycycline (VIBRA-TABS) 100 MG tablet Take 1 tablet (100 mg total) by mouth every 12 (twelve) hours.       No facility-administered medications prior to visit.   Allergies  Allergen Reactions  . Sulfa Antibiotics Itching  . Tramadol Hcl Itching and Rash   Patient Active Problem List   Diagnosis Date Noted  . Cellulitis 09/16/2012  . CKD (chronic kidney disease) 04/06/2012  . Tachy-brady syndrome 03/20/2012  . Atrial  fibrillation 02/05/2012  . TIA (transient ischemic attack) 05/16/2011  . Anemia 05/16/2011  . Rheumatoid arthritis(714.0) 01/26/2010  . CHEST PAIN, ATYPICAL 11/27/2009  . HEARING LOSS 06/24/2009  . HOARSENESS 05/22/2009  . HYPOTHYROIDISM 05/21/2009  . HYPERLIPIDEMIA 05/21/2009  . HYPERTENSION 05/21/2009  . GERD 05/21/2009  . EDEMA 03/19/2009  .  BREAST CANCER 03/18/2009  . MITRAL VALVE INSUFF&AORTIC VALVE INSUFF 03/18/2009  . PULMONARY HYPERTENSION 03/18/2009  . SICK SINUS SYNDROME 03/18/2009  . CHF 03/18/2009  . CARDIOMEGALY 03/18/2009  . PACEMAKER-[employer] 03/18/2009   History  Substance Use Topics  . Smoking status: Never Smoker   . Smokeless tobacco: Never Used     Comment: Lives at [facility] since 03/2009- married, lives with spouse. Moved here from [facility] to be near son  . Alcohol Use: No     family history includes Coronary artery disease in her brother and sister; Hypertension in her mother; Lung cancer in her father; and Ovarian cancer in her mother.  Objective:   Physical Exam well-developed frail-appearing elderly white female in a wheelchair, accompanied by her family. Blood pressure 100/56 pulse 56 height 5 foot 4 weight 126. Weight was documented at 150 earlier this year. HEENT ;nontraumatic normocephalic EOMI PERRLA sclera anicteric, Supple no JVD, Cardiovascular; regular rate and rhythm with S1-S2 soft systolic murmur, Pulmonary; clear bilaterally, Abdomen ;soft mildly tender in the epigastrium there is no guarding or rebound no palpable mass or hepatosplenomegaly bowel sounds are present, Rectal; exam not done, Extremities ;1-2+ edema bilaterally lower trace she has on compression stockings, Psych ;mood and affect normal and appropriate       Assessment & Plan:  #61 79 year old female with 3-4 month history of weight loss, decrease in appetite, intermittent vomiting- now progressing to daily symptoms with anorexia, nausea, intermittent dry  heaves, vomiting epigastric pain and odynophagia. Will need to rule out occult malignancy, peptic ulcer disease, gastropathy and now with new symptoms consistent with esophagitis which may be acid peptic or possibly secondary to candidiasis especially in light of recent long course of antibiotics #2 anticoagulation with Pradaxa #3 atrial fibrillation #4 Rheumatoid arthritis #5 history of breast cancer 2008 #6 chronic anemia and thrombocytopenia #7 history of TIA 2013 #8 chronic kidney disease #9 pulmonary hypertension #10 sick sinus syndrome status post pacemaker placement  Plan; Long discussion with patient and her family. We will schedule for upper endoscopy with Dr. Russella Dar later this week. Her seizure discussed in detail and they are agreeable to proceed We'll hold Pradaxa for 3 days-Will notify Dr. Allred(cardiology) Increase Protonix to 40 mg by mouth twice daily Have written the order for nursing home to give her Zofran 4 mg scheduled before each meal Will stop Q10, fish oil supplement, lecithin, oral iron, and a multivitamin. If the EGD is unrevealing she will need CT of the abdomen and pelvis

## 2012-10-15 NOTE — Telephone Encounter (Signed)
  10/15/2012    RE: Nicole Roberts DOB: 1921/07/06 MRN: [id number]   Dear Dr. Johney Frame,    We have scheduled the above patient for an endoscopic procedure. Our records show that she is on anticoagulation therapy.   Please advise if it is ok for patient to come off her therapy of Pradaxa starting today Monday 10/15/2012; prior to the procedure, which is scheduled for 10/18/2012.  Please fax back/ or route the completed form to [facility] at [phone].   Sincerely,  Gino Garrabrant CMA-AAMA for Amy Esterwood P.A.-C

## 2012-10-15 NOTE — Patient Instructions (Addendum)
You have been scheduled for an endoscopy with propofol. Please follow written instructions given to you at your visit today. If you use inhalers (even only as needed), please bring them with you on the day of your procedure. Your physician has requested that you go to www.startemmi.com and enter the access code given to you at your visit today. This web site gives a general overview about your procedure. However, you should still follow specific instructions given to you by our office regarding your preparation for the procedure.  We have sent the following medications to your pharmacy for you to pick up at your convenience: protonix and Zofran; please take medications as directed.   STOP TAKING PRADXA UNDTIL AFTER YOUR PROCEDURE  Discontinue taking: CoQ-10, Iron, Multivitamin, Fish oil and Lecithin                                               We are excited to introduce MyChart, a new best-in-class service that provides you online access to important information in your electronic medical record. We want to make it easier for you to view your health information - all in one secure location - when and where you need it. We expect MyChart will enhance the quality of care and service we provide.  When you register for MyChart, you can:    View your test results.    Request appointments and receive appointment reminders via email.    Request medication renewals.    View your medical history, allergies, medications and immunizations.    Communicate with your physician's office through a password-protected site.    Conveniently print information such as your medication lists.  To find out if MyChart is right for you, please talk to a member of our clinical staff today. We will gladly answer your questions about this free health and wellness tool.  If you are age 17 or older and want a member of your family to have access to your record, you must provide written consent by completing a proxy  form available at our office. Please speak to our clinical staff about guidelines regarding accounts for patients younger than age 76.  As you activate your MyChart account and need any technical assistance, please call the MyChart technical support line at [phone]-CHART [phone]) or email your question to [email or web address]. If you email your question(s), please include your name, a return phone number and the best time to reach you.  If you have non-urgent health-related questions, you can send a message to our office through MyChart at [email or web address]. If you have a medical emergency, call 911.  Thank you for using MyChart as your new health and wellness resource!   MyChart licensed from [employer],  4540-9811. Patents Pending.

## 2012-10-16 ENCOUNTER — Telehealth: Payer: Self-pay | Admitting: Gastroenterology

## 2012-10-16 NOTE — Telephone Encounter (Signed)
Per Dr. Shirlee Latch, OK to hold Pradaxa as ordered. Pt will be off of it from Monday 10/15/2012 Until after the procedure.

## 2012-10-16 NOTE — Telephone Encounter (Signed)
Called Dr. Jennell Corner office, was told he is out of the office until tomorrow. Spoke to [facility] and she will let Dr. Shirlee Latch make the call on pt's pradaxa and let me know ASAP.

## 2012-10-17 NOTE — Telephone Encounter (Signed)
She needs to be on calcium especially is she is taking the Vit D

## 2012-10-17 NOTE — Telephone Encounter (Signed)
Pt's daughter's informed of NP's advisement.

## 2012-10-18 ENCOUNTER — Encounter (HOSPITAL_COMMUNITY)
Admission: RE | Disposition: A | Discharge status: Home / Self Care | Payer: Self-pay | Source: Ambulatory Visit | Attending: Gastroenterology

## 2012-10-18 ENCOUNTER — Ambulatory Visit (HOSPITAL_COMMUNITY)
Admission: RE | Admit: 2012-10-18 | Discharge: 2012-10-18 | Disposition: A | Discharge status: Home / Self Care | Payer: Medicare Other | Source: Ambulatory Visit | Attending: Gastroenterology | Admitting: Gastroenterology

## 2012-10-18 ENCOUNTER — Encounter (HOSPITAL_COMMUNITY): Payer: Self-pay | Admitting: *Deleted

## 2012-10-18 DIAGNOSIS — Z950 Presence of cardiac pacemaker: Secondary | ICD-10-CM | POA: Insufficient documentation

## 2012-10-18 DIAGNOSIS — R1013 Epigastric pain: Secondary | ICD-10-CM

## 2012-10-18 DIAGNOSIS — Z853 Personal history of malignant neoplasm of breast: Secondary | ICD-10-CM | POA: Insufficient documentation

## 2012-10-18 DIAGNOSIS — R112 Nausea with vomiting, unspecified: Secondary | ICD-10-CM | POA: Insufficient documentation

## 2012-10-18 DIAGNOSIS — R5381 Other malaise: Secondary | ICD-10-CM | POA: Insufficient documentation

## 2012-10-18 DIAGNOSIS — I4891 Unspecified atrial fibrillation: Secondary | ICD-10-CM | POA: Insufficient documentation

## 2012-10-18 DIAGNOSIS — K2289 Other specified disease of esophagus: Secondary | ICD-10-CM | POA: Insufficient documentation

## 2012-10-18 DIAGNOSIS — R634 Abnormal weight loss: Secondary | ICD-10-CM

## 2012-10-18 DIAGNOSIS — N189 Chronic kidney disease, unspecified: Secondary | ICD-10-CM | POA: Insufficient documentation

## 2012-10-18 DIAGNOSIS — K2970 Gastritis, unspecified, without bleeding: Secondary | ICD-10-CM | POA: Insufficient documentation

## 2012-10-18 DIAGNOSIS — K228 Other specified diseases of esophagus: Secondary | ICD-10-CM | POA: Insufficient documentation

## 2012-10-18 DIAGNOSIS — Z8673 Personal history of transient ischemic attack (TIA), and cerebral infarction without residual deficits: Secondary | ICD-10-CM | POA: Insufficient documentation

## 2012-10-18 DIAGNOSIS — M069 Rheumatoid arthritis, unspecified: Secondary | ICD-10-CM | POA: Insufficient documentation

## 2012-10-18 DIAGNOSIS — R1310 Dysphagia, unspecified: Secondary | ICD-10-CM | POA: Insufficient documentation

## 2012-10-18 DIAGNOSIS — Z9089 Acquired absence of other organs: Secondary | ICD-10-CM | POA: Insufficient documentation

## 2012-10-18 DIAGNOSIS — K2990 Gastroduodenitis, unspecified, without bleeding: Secondary | ICD-10-CM | POA: Insufficient documentation

## 2012-10-18 HISTORY — PX: ESOPHAGOGASTRODUODENOSCOPY: SHX5428

## 2012-10-18 SURGERY — EGD (ESOPHAGOGASTRODUODENOSCOPY)
Anesthesia: Moderate Sedation

## 2012-10-18 MED ORDER — FENTANYL CITRATE 0.05 MG/ML IJ SOLN
INTRAMUSCULAR | Status: AC
  Filled 2012-10-18: qty 2

## 2012-10-18 MED ORDER — FENTANYL CITRATE 0.05 MG/ML IJ SOLN
INTRAMUSCULAR | Status: DC | PRN
  Administered 2012-10-18 (×2): 12.5 ug via INTRAVENOUS

## 2012-10-18 MED ORDER — MIDAZOLAM HCL 10 MG/2ML IJ SOLN
INTRAMUSCULAR | Status: AC
  Filled 2012-10-18: qty 4

## 2012-10-18 MED ORDER — DIPHENHYDRAMINE HCL 50 MG/ML IJ SOLN
INTRAMUSCULAR | Status: AC
  Filled 2012-10-18: qty 1

## 2012-10-18 MED ORDER — FLUCONAZOLE 100 MG PO TABS: 100.0000 mg | ORAL_TABLET | Freq: Every day | ORAL | Status: DC

## 2012-10-18 MED ORDER — MIDAZOLAM HCL 10 MG/2ML IJ SOLN
INTRAMUSCULAR | Status: DC | PRN
  Administered 2012-10-18 (×3): 1 mg via INTRAVENOUS

## 2012-10-18 MED ORDER — BUTAMBEN-TETRACAINE-BENZOCAINE 2-2-14 % EX AERO
INHALATION_SPRAY | CUTANEOUS | Status: DC | PRN
  Administered 2012-10-18: 2 via TOPICAL

## 2012-10-18 MED ORDER — SODIUM CHLORIDE 0.9 % IV SOLN
INTRAVENOUS | Status: DC
  Administered 2012-10-18: 500 mL via INTRAVENOUS

## 2012-10-18 NOTE — Op Note (Signed)
[facility] [address] [place], [address]   ENDOSCOPY PROCEDURE REPORT  PATIENT: Nicole Roberts, Nicole Roberts  MR#: [id number] BIRTHDATE: August 14, 1921 , 89  yrs. old GENDER: Female ENDOSCOPIST: Meryl Dare, MD, [facility] PROCEDURE DATE:  10/18/2012 PROCEDURE:  EGD, diagnostic ASA CLASS:     Class III INDICATIONS:  Nausea.   Vomiting.   Weight loss.   Odynophagia. MEDICATIONS: medications were titrated to patient response per physician's verbal order, Fentanyl 25 mcg IV, and Versed 3 mg IV TOPICAL ANESTHETIC: Cetacaine Spray DESCRIPTION OF PROCEDURE: After the risks benefits and alternatives of the procedure were thoroughly explained, informed consent was obtained.  The PENTAX GASTOROSCOPE [id number] endoscope was introduced through the mouth and advanced to the second portion of the duodenum  without limitations.  The instrument was slowly withdrawn as the mucosa was fully examined.  ESOPHAGUS: White exudates consistent with candidiasis were found in the middle third of the esophagus.   The esophagus was otherwise normal.  Tortuous esophagus and mildly dilated proximal esophagus.  STOMACH: Mild prepyloric gastritis. Otherwise the mucosa and folds of the stomach appeared normal. DUODENUM: The duodenal mucosa showed no abnormalities in the bulb and second portion of the duodenum.  Retroflexed views revealed no abnormalities.  The scope was then withdrawn from the patient and the procedure completed.  COMPLICATIONS: There were no complications.  ENDOSCOPIC IMPRESSION: 1.   White exudates consistent with candidiasis 2.   Tortuous esophagus, mildly dilated proximal esophagus 3.   Mild prepyloric gastritis  RECOMMENDATIONS: 1.  Anti-reflux regimen 2.  Continue PPI 3.  Dilfucan 100 mg po daily for 7 days 4.  If symptoms don't resolve with Diflucan consider evaluation for possible esophageal motility disorder and gastroparesis.  PCP to evaluate for medication side effects  leading to some of her GI symptoms  [ eSigned:  Meryl Dare, MD, [facility] 10/18/2012 9:03 AM

## 2012-10-18 NOTE — Interval H&P Note (Signed)
History and Physical Interval Note:  10/18/2012 8:41 AM  Nicole Roberts  has presented today for surgery, with the diagnosis of Odynaphagia, nause/vomiting, epigastric pain  The various methods of treatment have been discussed with the patient and family. After consideration of risks, benefits and other options for treatment, the patient has consented to  Procedure(s): ESOPHAGOGASTRODUODENOSCOPY (EGD) (N/A) as a surgical intervention .  The patient's history has been reviewed, patient examined, no change in status, stable for surgery.  I have reviewed the patient's chart and labs.  Questions were answered to the patient's satisfaction.     Venita Lick. Russella Dar MD Clementeen Graham

## 2012-10-18 NOTE — H&P (View-Only) (Signed)
Subjective:    Patient ID: Nicole Roberts, female    DOB: 07/31/1921, 79 y.o.   MRN: [id number]  HPI Nicole Roberts is an 79 sure old white female with multiple medical problems who is currently residing in a nursing facility after hospitalization for a cellulitis in may of 2014. She became deconditioned at that time and is therefore rehabilitation . She is known to Nicole Roberts from prior endoscopic evaluation. She has history of atrial fibrillation for which she is maintained on per act as a. She does have history of TIA in January of 2013, breast cancer with lumpectomy in 2008, rheumatoid arthritis, pulmonary hypertension, sick sinus syndrome status post pacemaker placement and chronic kidney disease. She comes in now for evaluation of nausea abdominal discomfort and significant weight loss of approximately 25 pounds over the past 3 months. Patient's son says that she's had bouts of nausea and vomiting over the past 8-12 months but has gotten to the point now that she is having symptoms every day. She says her symptoms started just prior to her admission in May. She says her food doesn't taste good and she has a bad taste in her mouth. She's having nausea on a daily basis occasional dry heaves and generally has at least one episode of vomiting or dry heaves every day . She also admits to a burning sensation with swallowing and says she "burns all the way down" appear she does not feel that her food is sticking and has not had regurgitation. She has discomfort in her upper abdomen with eating and has been eating much less than usual. She is not having any ongoing problems with diarrhea constipation Allen or hematochezia. She has been on Protonix long-term. She status post cholecystectomy Her most recent labs 09/21/2012 shows WBC of 8.6 hemoglobin 9.3 hematocrit of 27.8 and platelets of 127, iron studies were done in September of 2013 and she is not iron deficient. She and her family are concerned because she has  so many medications and would like her to get off of whatever is not essential .   Review of Systems  Constitutional: Positive for appetite change, fatigue and unexpected weight change.  HENT: Positive for trouble swallowing.   Eyes: Negative.   Respiratory: Negative.   Cardiovascular: Negative.   Gastrointestinal: Positive for nausea, vomiting and abdominal pain.  Endocrine: Negative.   Genitourinary: Negative.   Musculoskeletal: Negative.   Skin: Negative.   Allergic/Immunologic: Negative.   Neurological: Positive for weakness.  Hematological: Negative.   Psychiatric/Behavioral: Negative.    Outpatient Prescriptions Prior to Visit  Medication Sig Dispense Refill  . acetaminophen (TYLENOL) 500 MG tablet Take 500 mg by mouth every 6 (six) hours as needed for pain. For pain      . atorvastatin (LIPITOR) 10 MG tablet Take 10 mg by mouth at bedtime.      . beta carotene w/minerals (OCUVITE) tablet Take 1 tablet by mouth daily.      . Cholecalciferol (VITAMIN D3) 1000 UNITS CAPS Take 1,000 Units by mouth daily.       . citalopram (CELEXA) 20 MG tablet Take 20 mg by mouth daily.      . dabigatran (PRADAXA) 75 MG CAPS Take 75 mg by mouth every 12 (twelve) hours.      . diphenoxylate-atropine (LOMOTIL) 2.5-0.025 MG per tablet Take 1 tablet by mouth 4 (four) times daily as needed for diarrhea or loose stools.      . fentaNYL (DURAGESIC - DOSED MCG/HR) 100 MCG/HR Place 1   patch (100 mcg total) onto the skin every 3 (three) days.  5 patch  0  . folic acid (FOLVITE) 400 MCG tablet Take 400 mcg by mouth daily.      . furosemide (LASIX) 20 MG tablet Take 1 tablet (20 mg total) by mouth 2 (two) times daily.  30 tablet    . HYDROcodone-acetaminophen (NORCO/VICODIN) 5-325 MG per tablet Take 1 tablet by mouth every 6 (six) hours as needed.  30 tablet  0  . isosorbide mononitrate (IMDUR) 15 mg TB24 Take 15 mg by mouth daily.      . leflunomide (ARAVA) 20 MG tablet Take 20 mg by mouth daily.      .  levothyroxine (SYNTHROID, LEVOTHROID) 25 MCG tablet TAKE 1 TABLET DAILY  90 tablet  3  . loperamide (IMODIUM) 2 MG capsule Take 1 capsule (2 mg total) by mouth as needed for diarrhea or loose stools.  30 capsule  0  . metoprolol (LOPRESSOR) 100 MG tablet Take 50-100 mg by mouth 2 (two) times daily. Take 1 tab in the morning and 1/2 tab in the evening      . nitroGLYCERIN (NITROSTAT) 0.4 MG SL tablet Place 0.4 mg under the tongue every 5 (five) minutes x 3 doses as needed for chest pain.      . potassium chloride (K-DUR) 10 MEQ tablet Take 20 mEq by mouth daily.      . predniSONE (DELTASONE) 5 MG tablet Take 5 mg by mouth daily.      . vitamin B-12 (CYANOCOBALAMIN) 500 MCG tablet Take 500 mcg by mouth 2 (two) times daily.      . zinc sulfate 220 MG capsule Take 220 mg by mouth daily.       . Coenzyme Q10 (CO Q10) 100 MG TABS Take 100 mg by mouth at bedtime.       . ferrous sulfate 325 (65 FE) MG tablet Take 325 mg by mouth daily with breakfast.       . Lecithin 400 MG CAPS Take 400 mg by mouth daily.       . Multiple Vitamin (MULTIVITAMIN) tablet Take 1 tablet by mouth daily.      . Omega-3 Fatty Acids (FISH OIL) 1000 MG CPDR Take 1,000 mg by mouth at bedtime.       . ondansetron (ZOFRAN) 4 MG tablet Take 4 mg by mouth every 8 (eight) hours as needed for nausea. For nausea      . pantoprazole (PROTONIX) 40 MG tablet Take 40 mg by mouth daily. TAKE 1 TABLET DAILY      . ciprofloxacin (CIPRO) 500 MG tablet Take 1 tablet (500 mg total) by mouth 2 (two) times daily.      . doxycycline (VIBRA-TABS) 100 MG tablet Take 1 tablet (100 mg total) by mouth every 12 (twelve) hours.       No facility-administered medications prior to visit.   Allergies  Allergen Reactions  . Sulfa Antibiotics Itching  . Tramadol Hcl Itching and Rash   Patient Active Problem List   Diagnosis Date Noted  . Cellulitis 09/16/2012  . CKD (chronic kidney disease) 04/06/2012  . Tachy-brady syndrome 03/20/2012  . Atrial  fibrillation 02/05/2012  . TIA (transient ischemic attack) 05/16/2011  . Anemia 05/16/2011  . Rheumatoid arthritis(714.0) 01/26/2010  . CHEST PAIN, ATYPICAL 11/27/2009  . HEARING LOSS 06/24/2009  . HOARSENESS 05/22/2009  . HYPOTHYROIDISM 05/21/2009  . HYPERLIPIDEMIA 05/21/2009  . HYPERTENSION 05/21/2009  . GERD 05/21/2009  . EDEMA 03/19/2009  .   BREAST CANCER 03/18/2009  . MITRAL VALVE INSUFF&AORTIC VALVE INSUFF 03/18/2009  . PULMONARY HYPERTENSION 03/18/2009  . SICK SINUS SYNDROME 03/18/2009  . CHF 03/18/2009  . CARDIOMEGALY 03/18/2009  . PACEMAKER-[employer] 03/18/2009   History  Substance Use Topics  . Smoking status: Never Smoker   . Smokeless tobacco: Never Used     Comment: Lives at [facility] since 03/2009- married, lives with spouse. Moved here from [place] to be near son  . Alcohol Use: No     family history includes Coronary artery disease in her brother and sister; Hypertension in her mother; Lung cancer in her father; and Ovarian cancer in her mother.  Objective:   Physical Exam well-developed frail-appearing elderly white female in a wheelchair, accompanied by her family. Blood pressure 100/56 pulse 56 height 5 foot 4 weight 126. Weight was documented at 150 earlier this year. HEENT ;nontraumatic normocephalic EOMI PERRLA sclera anicteric, Supple no JVD, Cardiovascular; regular rate and rhythm with S1-S2 soft systolic murmur, Pulmonary; clear bilaterally, Abdomen ;soft mildly tender in the epigastrium there is no guarding or rebound no palpable mass or hepatosplenomegaly bowel sounds are present, Rectal; exam not done, Extremities ;1-2+ edema bilaterally lower trace she has on compression stockings, Psych ;mood and affect normal and appropriate       Assessment & Plan:  #1 79-year-old female with 3-4 month history of weight loss, decrease in appetite, intermittent vomiting- now progressing to daily symptoms with anorexia, nausea, intermittent dry  heaves, vomiting epigastric pain and odynophagia. Will need to rule out occult malignancy, peptic ulcer disease, gastropathy and now with new symptoms consistent with esophagitis which may be acid peptic or possibly secondary to candidiasis especially in light of recent long course of antibiotics #2 anticoagulation with Pradaxa #3 atrial fibrillation #4 Rheumatoid arthritis #5 history of breast cancer 2008 #6 chronic anemia and thrombocytopenia #7 history of TIA 2013 #8 chronic kidney disease #9 pulmonary hypertension #10 sick sinus syndrome status post pacemaker placement  Plan; Long discussion with patient and her family. We will schedule for upper endoscopy with Nicole Roberts later this week. Her seizure discussed in detail and they are agreeable to proceed We'll hold Pradaxa for 3 days-Will notify Dr. Allred(cardiology) Increase Protonix to 40 mg by mouth twice daily Have written the order for nursing home to give her Zofran 4 mg scheduled before each meal Will stop Q10, fish oil supplement, lecithin, oral iron, and a multivitamin. If the EGD is unrevealing she will need CT of the abdomen and pelvis 

## 2012-10-19 ENCOUNTER — Encounter (HOSPITAL_COMMUNITY): Payer: Self-pay | Admitting: Gastroenterology

## 2012-10-25 ENCOUNTER — Telehealth: Payer: Self-pay | Admitting: *Deleted

## 2012-10-25 NOTE — Telephone Encounter (Signed)
Nicole Roberts from [facility] called requesting orders for twice a week for two weeks.  Please advise.

## 2012-10-25 NOTE — Telephone Encounter (Signed)
Ok to continue PT.

## 2012-10-25 NOTE — Telephone Encounter (Signed)
Demeka, RN from [place], called requesting orders for home health RN to go out to the home once for one week and twice for the second week.  Please advise.

## 2012-10-25 NOTE — Telephone Encounter (Signed)
Spoke with Duwayne Heck advised her it was ok to continue PT as per [place].

## 2012-10-26 NOTE — Telephone Encounter (Signed)
Spoke with Demeka advised her of Regina's order.

## 2012-10-26 NOTE — Telephone Encounter (Signed)
Ok for verbal orders for home health

## 2012-10-29 ENCOUNTER — Ambulatory Visit (INDEPENDENT_AMBULATORY_CARE_PROVIDER_SITE_OTHER): Payer: Medicare Other | Admitting: Internal Medicine

## 2012-10-29 ENCOUNTER — Encounter: Payer: Self-pay | Admitting: Internal Medicine

## 2012-10-29 VITALS — BP 130/82 | HR 98 | Temp 98.3°F | Wt 126.8 lb

## 2012-10-29 DIAGNOSIS — I4891 Unspecified atrial fibrillation: Secondary | ICD-10-CM

## 2012-10-29 DIAGNOSIS — L0390 Cellulitis, unspecified: Secondary | ICD-10-CM

## 2012-10-29 DIAGNOSIS — F329 Major depressive disorder, single episode, unspecified: Secondary | ICD-10-CM

## 2012-10-29 DIAGNOSIS — R609 Edema, unspecified: Secondary | ICD-10-CM

## 2012-10-29 MED ORDER — METOPROLOL SUCCINATE ER 100 MG PO TB24: 100.0000 mg | ORAL_TABLET | Freq: Every day | ORAL | Status: DC

## 2012-10-29 MED ORDER — CITALOPRAM HYDROBROMIDE 40 MG PO TABS: 40.0000 mg | ORAL_TABLET | Freq: Every day | ORAL | Status: DC

## 2012-10-29 MED ORDER — ISOSORBIDE MONONITRATE 15 MG HALF TABLET: 15.0000 mg | ORAL_TABLET | Freq: Every day | ORAL | Status: DC

## 2012-10-29 NOTE — Assessment & Plan Note (Signed)
Chronic lymphedema, BLE Improved with Lasix compliance during hospitalization and SNF stay - ok to use 20 qAM and 20qPM prn -max 40 mg/d  advised continued compliance with compression hose

## 2012-10-29 NOTE — Patient Instructions (Signed)
It was good to see you today. We have reviewed your prior records including labs and tests today Change metoprolol to extended release 100 mg daily, change citalopram to 40 mg daily and refill as requested Other medications reviewed and updated, no additional changes recommended Please schedule followup in 3-4 months, call sooner if problems.

## 2012-10-29 NOTE — Assessment & Plan Note (Signed)
Chronic symptoms, exacerbated by acute illness, stress about illness of spouse Family has self titrated SSRI therapy, reports symptomatic improvement Continue max dose citalopram as currently ongoing Verified no SI/HI Family will continue to monitor and call if increased symptoms or other problems

## 2012-10-29 NOTE — Assessment & Plan Note (Signed)
On anticoag pradaxa - ASA stopped 01/2012 Rate controlled on current beta blocker, changed to extended release at current dosing (dose reduced from 100 bid to 50 bid in 09/2012 at home to avoid hypotension and fatigue) Follows with cards for same  The current medical regimen is effective;  continue present plan and medications.

## 2012-10-29 NOTE — Progress Notes (Signed)
Subjective:    Patient ID: Nicole Roberts, female    DOB: May 04, 1921, 79 y.o.   MRN: [id number]  HPI  Here for follow up -reviewed chronic medical issues and interval medical events Also extensive medication review with family with numerous questions answered re: SNF stay  Past Medical History  Diagnosis Date  . HEARING LOSS   . MITRAL VALVE INSUFF&AORTIC VALVE INSUFF     moderate MR  . PULMONARY HYPERTENSION   . Atrial fibrillation     permanent  . SICK SINUS SYNDROME     a. Tachybrady syndrome - [employer] PPM 10/2005.  . Edema 03/19/2009    due to venous insufficiency, chronic lymphedema  . URINARY INCONTINENCE   . Rheumatoid arthritis(714.0) dx clarified 2011    On prednisone  . BREAST CANCER 12/2006    ductal ca s/p L lumpectomy  . Diastolic dysfunction   . HYPERTENSION   . HYPERLIPIDEMIA   . GERD   . HYPOTHYROIDISM   . Hyperparathyroidism     2 lobes removed  . Venous insufficiency     chronic BLE edema  . Spinal stenosis   . Anemia     a. Macrocytic - normal B12, folate 08/2011. b. 3/6 heme positive stools 10/2011 - per PCP note, elected against colonscopy.  [name] TIA (transient ischemic attack) 05/2011  . Anxiety      Review of Systems  Constitutional: Positive for fatigue. Negative for fever, activity change and appetite change.  HENT: Positive for hearing loss (on R, chronic and progressive, >L).   Respiratory: Negative for cough and shortness of breath.   Cardiovascular: Positive for leg swelling (chronic BLE, but currently improved since SNF stay). Negative for chest pain.  Musculoskeletal: Positive for arthralgias. Negative for joint swelling.       Objective:   Physical Exam  BP 130/82  Pulse 98  Temp(Src) 98.3 F (36.8 C) (Oral)  Wt 126 lb 12.8 oz (57.516 kg)  BMI 21.75 kg/m2  SpO2 98% Wt Readings from Last 3 Encounters:  10/29/12 126 lb 12.8 oz (57.516 kg)  10/15/12 126 lb 12.8 oz (57.516 kg)  09/21/12 138 lb (62.596 kg)   Constitutional: She  appears well-developed and well-nourished. No distress. son and daughter-in-law at side HENT: bilateral tympanic membranes clear, no effusion or erythema. No cerumen. Right greater than left hearing changes, chronic Neck: Normal range of motion. Neck supple. No JVD present. No thyromegaly present.  Cardiovascular: Normal rate, regular rhythm and normal heart sounds.  No murmur heard. chronic BLE edema, currently improved compared to baseline -wearing compression nose bilaterally Pulmonary/Chest: Effort normal and breath sounds normal. No respiratory distress. She has no wheeze. Abd: SNTND+BS, no R/G  Psychiatric: She has a normal mood and affect. Her behavior is normal. Judgment and thought content normal.    Lab Results  Component Value Date   WBC 8.6 09/21/2012   HGB 9.3* 09/21/2012   HCT 27.8* 09/21/2012   PLT 127* 09/21/2012   CHOL 143 03/20/2012   TRIG 77 03/20/2012   HDL 70 03/20/2012   LDLDIRECT 131.4 12/28/2009   ALT 38* 09/17/2012   AST 21 09/17/2012   NA 138 09/19/2012   K 4.6 09/19/2012   CL 101 09/19/2012   CREATININE 1.23* 09/19/2012   BUN 22 09/19/2012   CO2 24 09/19/2012   TSH 1.058 09/17/2012   INR 1.27 03/19/2012   Lab Results  Component Value Date   IRON 109 05/30/2011       Assessment & Plan:  See problem list. Medications and labs reviewed today.  Cellulitis, resolved with antibiotic therapy and nursing care -hospitalization for same reviewed  Esophagitis, antibiotic related and reflux. GI evaluation reviewed. Symptoms resolved. Okay to wean Carafate as tolerated and change PPI to once daily  Time spent with pt/family today 25 minutes, greater than 50% time spent counseling patient on chronic lymphedema, AF, depression and medication review. Also review of SNF DC records

## 2012-10-30 ENCOUNTER — Telehealth: Payer: Self-pay | Admitting: *Deleted

## 2012-10-30 MED ORDER — ISOSORBIDE MONONITRATE ER 30 MG PO TB24: 15.0000 mg | ORAL_TABLET | Freq: Every day | ORAL | Status: DC

## 2012-10-30 NOTE — Telephone Encounter (Signed)
Received fax stating please clarify drug name and strength. Received script for Imdur 15mg  take half total 15 mg daily. Did md intend to send imdur 30 mg with directions take 1/2 daily...Raechel Chute

## 2012-10-30 NOTE — Telephone Encounter (Signed)
Demeka from [place] called states while reviewing pt's medications she noticed pt has major interactions between Fluconazole/Atorvastatin, Fluconazole/Fentanyl and Fluconazole/Citalopram.  She further states the Fluconazole has been discontinued but she wanted you to be aware.

## 2012-10-30 NOTE — Telephone Encounter (Signed)
Yes, I am aware. Fluconazole was not prescribed by me that was prescribed for treatment of candidal esophagitis; furthermore patient is no longer on fluconazole

## 2012-10-30 NOTE — Telephone Encounter (Signed)
Yes, should be 30mg  tab, take 1/2 tab (15mg  total) daily

## 2012-11-05 ENCOUNTER — Telehealth: Payer: Self-pay | Admitting: Internal Medicine

## 2012-11-05 ENCOUNTER — Other Ambulatory Visit: Payer: Self-pay | Admitting: Physician Assistant

## 2012-11-05 NOTE — Telephone Encounter (Signed)
Call regarding patients prescription.  [facility] has not sent prescription.  Please send to [facility].  She is out of her medication.  Message did not indicate which medication.

## 2012-11-06 MED ORDER — ISOSORBIDE MONONITRATE ER 30 MG PO TB24: 15.0000 mg | ORAL_TABLET | Freq: Every day | ORAL | Status: DC

## 2012-11-06 NOTE — Telephone Encounter (Signed)
Left msg on vm stating called yesterday about getting [facility]'t [facility] order out. Would like a 10 day supply [facility]..../lmb

## 2012-11-06 NOTE — Telephone Encounter (Signed)
Called son back inform him will send to [facility]...lmb

## 2012-11-14 ENCOUNTER — Telehealth: Payer: Self-pay | Admitting: *Deleted

## 2012-11-14 NOTE — Telephone Encounter (Signed)
Pt called states she is having burning at the bottom of her foot, requesting a return call.  Please advise

## 2012-11-14 NOTE — Telephone Encounter (Signed)
Unable to reach pt again.  No VM.

## 2012-11-14 NOTE — Telephone Encounter (Signed)
Please call the patient on my behalf to get more information thanks

## 2012-11-24 ENCOUNTER — Emergency Department (HOSPITAL_COMMUNITY)
Admission: EM | Admit: 2012-11-24 | Discharge: 2012-11-24 | Disposition: A | Discharge status: Home / Self Care | Payer: Medicare Other | Attending: Emergency Medicine | Admitting: Emergency Medicine

## 2012-11-24 ENCOUNTER — Encounter (HOSPITAL_COMMUNITY): Payer: Self-pay | Admitting: *Deleted

## 2012-11-24 DIAGNOSIS — Z853 Personal history of malignant neoplasm of breast: Secondary | ICD-10-CM | POA: Insufficient documentation

## 2012-11-24 DIAGNOSIS — Z8673 Personal history of transient ischemic attack (TIA), and cerebral infarction without residual deficits: Secondary | ICD-10-CM | POA: Insufficient documentation

## 2012-11-24 DIAGNOSIS — F411 Generalized anxiety disorder: Secondary | ICD-10-CM | POA: Insufficient documentation

## 2012-11-24 DIAGNOSIS — R609 Edema, unspecified: Secondary | ICD-10-CM | POA: Insufficient documentation

## 2012-11-24 DIAGNOSIS — Z79899 Other long term (current) drug therapy: Secondary | ICD-10-CM | POA: Insufficient documentation

## 2012-11-24 DIAGNOSIS — I10 Essential (primary) hypertension: Secondary | ICD-10-CM | POA: Insufficient documentation

## 2012-11-24 DIAGNOSIS — K219 Gastro-esophageal reflux disease without esophagitis: Secondary | ICD-10-CM | POA: Insufficient documentation

## 2012-11-24 DIAGNOSIS — L03116 Cellulitis of left lower limb: Secondary | ICD-10-CM

## 2012-11-24 DIAGNOSIS — R197 Diarrhea, unspecified: Secondary | ICD-10-CM | POA: Insufficient documentation

## 2012-11-24 DIAGNOSIS — E785 Hyperlipidemia, unspecified: Secondary | ICD-10-CM | POA: Insufficient documentation

## 2012-11-24 DIAGNOSIS — L02419 Cutaneous abscess of limb, unspecified: Secondary | ICD-10-CM | POA: Insufficient documentation

## 2012-11-24 DIAGNOSIS — Z8739 Personal history of other diseases of the musculoskeletal system and connective tissue: Secondary | ICD-10-CM | POA: Insufficient documentation

## 2012-11-24 DIAGNOSIS — G5790 Unspecified mononeuropathy of unspecified lower limb: Secondary | ICD-10-CM | POA: Insufficient documentation

## 2012-11-24 DIAGNOSIS — E039 Hypothyroidism, unspecified: Secondary | ICD-10-CM | POA: Insufficient documentation

## 2012-11-24 DIAGNOSIS — M069 Rheumatoid arthritis, unspecified: Secondary | ICD-10-CM | POA: Insufficient documentation

## 2012-11-24 DIAGNOSIS — Z87448 Personal history of other diseases of urinary system: Secondary | ICD-10-CM | POA: Insufficient documentation

## 2012-11-24 DIAGNOSIS — Z8679 Personal history of other diseases of the circulatory system: Secondary | ICD-10-CM | POA: Insufficient documentation

## 2012-11-24 LAB — BASIC METABOLIC PANEL
Chloride: 102 mEq/L (ref 96–112)
GFR calc Af Amer: 51 mL/min — ABNORMAL LOW (ref >90)
GFR calc non Af Amer: 44 mL/min — ABNORMAL LOW (ref >90)
Potassium: 3.4 mEq/L — ABNORMAL LOW (ref 3.5–5.1)
Sodium: 140 mEq/L (ref 135–145)

## 2012-11-24 LAB — CBC WITH DIFFERENTIAL/PLATELET
Basophils Relative: 0 % (ref 0–1)
Eosinophils Absolute: 0.1 10*3/uL (ref 0.0–0.7)
Hemoglobin: 13.5 g/dL (ref 12.0–15.0)
Lymphs Abs: 1.1 10*3/uL (ref 0.7–4.0)
MCH: 37.2 pg — ABNORMAL HIGH (ref 26.0–34.0)
MCHC: 34.3 g/dL (ref 30.0–36.0)
Neutro Abs: 2.6 10*3/uL (ref 1.7–7.7)
Neutrophils Relative %: 61 % (ref 43–77)
Platelets: 102 10*3/uL — ABNORMAL LOW (ref 150–400)
RBC: 3.63 MIL/uL — ABNORMAL LOW (ref 3.87–5.11)

## 2012-11-24 MED ORDER — CIPROFLOXACIN IN D5W 400 MG/200ML IV SOLN
400.0000 mg | Freq: Once | INTRAVENOUS | Status: AC
  Administered 2012-11-24: 400 mg via INTRAVENOUS
  Filled 2012-11-24: qty 200

## 2012-11-24 MED ORDER — CIPROFLOXACIN HCL 500 MG PO TABS: 500.0000 mg | ORAL_TABLET | Freq: Two times a day (BID) | ORAL | Status: DC

## 2012-11-24 MED ORDER — DOXYCYCLINE HYCLATE 100 MG PO CAPS: 100.0000 mg | ORAL_CAPSULE | Freq: Two times a day (BID) | ORAL | Status: DC

## 2012-11-24 MED ORDER — DOXYCYCLINE HYCLATE 100 MG PO TABS
100.0000 mg | ORAL_TABLET | Freq: Once | ORAL | Status: AC
  Administered 2012-11-24: 100 mg via ORAL
  Filled 2012-11-24: qty 1

## 2012-11-24 NOTE — ED Notes (Signed)
Pt and family member reports recently having cellulitis to right leg, had to be admitted to hospital for it. Now having severe swelling to left leg also, redness noted to left lower leg.

## 2012-11-24 NOTE — Discharge Instructions (Signed)
Cellulitis Cellulitis is an infection of the skin and the tissue beneath it. The infected area is usually red and tender. Cellulitis occurs most often in the arms and lower legs.  CAUSES  Cellulitis is caused by bacteria that enter the skin through cracks or cuts in the skin. The most common types of bacteria that cause cellulitis are Staphylococcus and Streptococcus. SYMPTOMS   Redness and warmth.  Swelling.  Tenderness or pain.  Fever. DIAGNOSIS  Your caregiver can usually determine what is wrong based on a physical exam. Blood tests may also be done. TREATMENT  Treatment usually involves taking an antibiotic medicine. HOME CARE INSTRUCTIONS   Take your antibiotics as directed. Finish them even if you start to feel better.  Keep the infected arm or leg elevated to reduce swelling.  Apply a warm cloth to the affected area up to 4 times per day to relieve pain.  Only take over-the-counter or prescription medicines for pain, discomfort, or fever as directed by your caregiver.  Keep all follow-up appointments as directed by your caregiver. SEEK MEDICAL CARE IF:   You notice red streaks coming from the infected area.  Your red area gets larger or turns dark in color.  Your bone or joint underneath the infected area becomes painful after the skin has healed.  Your infection returns in the same area or another area.  You notice a swollen bump in the infected area.  You develop new symptoms. SEEK IMMEDIATE MEDICAL CARE IF:   You have a fever.  You feel very sleepy.  You develop vomiting or diarrhea.  You have a general ill feeling (malaise) with muscle aches and pains. MAKE SURE YOU:   Understand these instructions.  Will watch your condition.  Will get help right away if you are not doing well or get worse. Document Released: 01/26/2005 Document Revised: 10/18/2011 Document Reviewed: 07/04/2011 [employer] Patient Information 2014 [employer].  

## 2012-11-24 NOTE — ED Provider Notes (Signed)
CSN: [id number]     Arrival date & time 11/24/12  1725 History     First MD Initiated Contact with Patient 11/24/12 1926     Chief Complaint  Patient presents with  . Leg Swelling   (Consider location/radiation/quality/duration/timing/severity/associated sxs/prior Treatment) HPI Comments: Pt with h/o severe cellulitis of right lower leg, was hospitalized and then in rehab back in May and June, now with 3 days of redness and tenderness to lower left leg.  No obvious injuries, no fevers at home.  Pt did have some neuropathy and pains to botom of left foot, had a podiatrist remove some corns from bottom of distal phalanx of 3rd toe.  Has not been draining, but that third toe has been red for the past week.  No CP, SOB, pleurisy.    The history is provided by the patient, medical records and a relative.    Past Medical History  Diagnosis Date  . HEARING LOSS   . MITRAL VALVE INSUFF&AORTIC VALVE INSUFF     moderate MR  . PULMONARY HYPERTENSION   . Atrial fibrillation     permanent  . SICK SINUS SYNDROME     a. Tachybrady syndrome - Guidant PPM 10/2005.  . Edema 03/19/2009    due to venous insufficiency, chronic lymphedema  . URINARY INCONTINENCE   . Rheumatoid arthritis(714.0) dx clarified 2011    On prednisone  . BREAST CANCER 12/2006    ductal ca s/p L lumpectomy  . Diastolic dysfunction   . HYPERTENSION   . HYPERLIPIDEMIA   . GERD   . HYPOTHYROIDISM   . Hyperparathyroidism     2 lobes removed  . Venous insufficiency     chronic BLE edema  . Spinal stenosis   . Anemia     a. Macrocytic - normal B12, folate 08/2011. b. 3/6 heme positive stools 10/2011 - per PCP note, elected against colonscopy.  [name] TIA (transient ischemic attack) 05/2011  . Anxiety    Past Surgical History  Procedure Laterality Date  . Pacemaker placement  2005    SSS and syncope  . Cholecystectomy  04/06/99  . Abdominal hysterectomy  1970    Partial  . Left hip arthroscopy  1995  . Right hip arthroscopy   01/2001  . Carpal tunnel release  1998    bilateral  . Breast surgery  12/2006    Left breast lupectomy- Ductal CA  . Left parathyroidectomy  07/2008  . Esophagogastroduodenoscopy N/A 10/18/2012    Procedure: ESOPHAGOGASTRODUODENOSCOPY (EGD);  Surgeon: Meryl Dare, MD;  Location: [facility];  Service: Endoscopy;  Laterality: N/A;   Family History  Problem Relation Age of Onset  . Ovarian cancer Mother   . Coronary artery disease Sister   . Coronary artery disease Brother   . Hypertension Mother     grandparent  . Lung cancer Father    History  Substance Use Topics  . Smoking status: Never Smoker   . Smokeless tobacco: Never Used     Comment: Lives at [facility] since 03/2009- married, lives with spouse. Moved here from [facility] to be near son  . Alcohol Use: No   OB History   Grav Para Term Preterm Abortions TAB SAB Ect Mult Living                 Review of Systems  Constitutional: Negative for fever and chills.  Respiratory: Negative for chest tightness and shortness of breath.   Cardiovascular: Positive for leg swelling. Negative for  chest pain.  Musculoskeletal: Positive for arthralgias.  Skin: Positive for color change. Negative for wound.  Neurological: Negative for weakness and numbness.  All other systems reviewed and are negative.    Allergies  Sulfa antibiotics and Tramadol hcl  Home Medications   Current Outpatient Rx  Name  Route  Sig  Dispense  Refill  . acetaminophen (TYLENOL) 500 MG tablet   Oral   Take 500 mg by mouth every 6 (six) hours as needed for pain. For pain         . atorvastatin (LIPITOR) 10 MG tablet   Oral   Take 10 mg by mouth at bedtime.         . citalopram (CELEXA) 40 MG tablet   Oral   Take 1 tablet (40 mg total) by mouth daily.   90 tablet   3   . dabigatran (PRADAXA) 75 MG CAPS   Oral   Take 1 capsule (75 mg total) by mouth every 12 (twelve) hours.   60 capsule      . diphenoxylate-atropine  (LOMOTIL) 2.5-0.025 MG per tablet   Oral   Take 1 tablet by mouth 4 (four) times daily as needed for diarrhea or loose stools.         . folic acid (FOLVITE) 400 MCG tablet   Oral   Take 400 mcg by mouth daily.         . furosemide (LASIX) 20 MG tablet   Oral   Take 1 tablet (20 mg total) by mouth 2 (two) times daily.   30 tablet      . HYDROcodone-acetaminophen (NORCO/VICODIN) 5-325 MG per tablet   Oral   Take 1 tablet by mouth every 6 (six) hours as needed.   30 tablet   0   . isosorbide mononitrate (IMDUR) 30 MG 24 hr tablet   Oral   Take 0.5 tablets (15 mg total) by mouth daily.   10 tablet   0     Enough until she received from her mail service   . leflunomide (ARAVA) 20 MG tablet   Oral   Take 20 mg by mouth daily.         [name] levothyroxine (SYNTHROID, LEVOTHROID) 25 MCG tablet      TAKE 1 TABLET DAILY   90 tablet   3   . lisinopril (PRINIVIL,ZESTRIL) 5 MG tablet   Oral   Take 5 mg by mouth daily.         [name] loperamide (IMODIUM) 2 MG capsule   Oral   Take 1 capsule (2 mg total) by mouth as needed for diarrhea or loose stools.   30 capsule   0   . metoprolol succinate (TOPROL-XL) 100 MG 24 hr tablet   Oral   Take 1 tablet (100 mg total) by mouth daily. Take with or immediately following a meal.   90 tablet   3   . nitroGLYCERIN (NITROSTAT) 0.4 MG SL tablet   Sublingual   Place 0.4 mg under the tongue every 5 (five) minutes x 3 doses as needed for chest pain.         [name] ondansetron (ZOFRAN) 4 MG tablet   Oral   Take 1 tablet (4 mg total) by mouth 3 (three) times daily before meals. For nausea   30 tablet   1   . pantoprazole (PROTONIX) 40 MG tablet   Oral   Take 1 tablet (40 mg total) by mouth daily.         [name]  potassium chloride (K-DUR) 10 MEQ tablet   Oral   Take 20 mEq by mouth daily.         . predniSONE (DELTASONE) 5 MG tablet   Oral   Take 5 mg by mouth daily.         . sucralfate (CARAFATE) 1 GM/10ML suspension    Oral   Take 1 g by mouth 4 (four) times daily -  with meals and at bedtime.         . ciprofloxacin (CIPRO) 500 MG tablet   Oral   Take 1 tablet (500 mg total) by mouth 2 (two) times daily.   20 tablet   0   . doxycycline (VIBRAMYCIN) 100 MG capsule   Oral   Take 1 capsule (100 mg total) by mouth 2 (two) times daily.   20 capsule   0    BP 108/60  Pulse 62  Temp(Src) 98.3 F (36.8 C) (Oral)  Resp 14  SpO2 100% Physical Exam  Nursing note and vitals reviewed. Constitutional: She appears well-developed and well-nourished.  HENT:  Head: Normocephalic and atraumatic.  Eyes: Conjunctivae and EOM are normal. No scleral icterus.  Neck: Normal range of motion. Neck supple.  Cardiovascular: Normal rate and intact distal pulses.   No murmur heard. Pulmonary/Chest: Effort normal. No respiratory distress. She has no wheezes. She has no rales.  Abdominal: Soft.  Musculoskeletal:  B pitting edema of both lower extremities, left lower extremity with warmth, redness, tenderness, mild.  Pt has redness more to middle toe, but there is a gap between toe and the erythema involving leg.  Foot is not involved.  Good PT pulses bilaterally.  Evidence of corn removed with appropriate appearing wound healing occurring, no purulence from site.    Neurological: She is alert. She exhibits normal muscle tone. Coordination normal.  Skin: Skin is warm.    ED Course   Procedures (including critical care time)  Labs Reviewed  CBC WITH DIFFERENTIAL  BASIC METABOLIC PANEL   No results found. 1. Cellulitis of left leg     ra sat is 99% and I interpret to be normal.  MDM  I reviewed prior notes from [facility], pt had long stay in hospital, then rehab.  Was on vanc and zosyn initially.  Complicated with diarrhea.  Pt completed course on doxycycline and cipro in the end.  Will give IV dose of cipro here and oral doxycycline and prescribe the same for home with close outpt follow.  Pt is nto toxic  appearing.  No fever here.  No signs or symptoms of PE, doubt DVT.    Gavin Pound. Oletta Lamas, MD 11/24/12 2134

## 2012-11-24 NOTE — ED Notes (Signed)
Pt presents with bilateral ankle/feet pitting edema. Hx of cellulitis of right leg in may, now left leg presents with cellulitis. Second toe of left foot had a corn removed, currently seems to be hole on bottom of toe, redness around it and entire toe. Cap refill <3

## 2012-11-24 NOTE — ED Notes (Signed)
Total urine capture 200cc.

## 2012-11-26 ENCOUNTER — Telehealth: Payer: Self-pay | Admitting: *Deleted

## 2012-11-26 NOTE — Telephone Encounter (Signed)
[facility] Triage Call Report Triage Record Num: [id number] Operator: Hillary Bow Patient Name: Nicole Roberts Call Date & Time: 11/24/2012 3:51:12PM Patient Phone: [phone] PCP: [name] Patient Gender: Female PCP Fax : [phone] Patient DOB: 11-26-1921 Practice Name: [facility] Reason for Call: Caller: Art/Son; PCP: [name] (Adults only); CB#: [phone]; Call regarding Cellulitis Right Leg; Pt had celluilitis in Left Leg, hospitalized in May 2014, Son states Right leg doesn't look as bad but starting to get painful and red. Son is wanting to have Pt seen just doesn't know whether to take to Urgent Care of ED. Triage offered. Advised Son to have Pt seen at ED due to severity of last diagnosis. Protocol(s) Used: Information Only Call; No Symptom Triage (Adult) Recommended Outcome per Protocol: Provide Information or Advice Only Reason for Outcome: Health information question; person denies any symptoms, no triage required. Information provided from approved references or clinical experience. Care Advice: ~ 11/24/2012 3:58:43PM Page 1 of 1 [email or web address]

## 2012-11-28 ENCOUNTER — Encounter: Payer: Self-pay | Admitting: Internal Medicine

## 2012-11-28 ENCOUNTER — Ambulatory Visit (INDEPENDENT_AMBULATORY_CARE_PROVIDER_SITE_OTHER): Payer: Medicare Other | Admitting: Internal Medicine

## 2012-11-28 VITALS — BP 110/68 | HR 67 | Temp 98.6°F | Wt 148.4 lb

## 2012-11-28 DIAGNOSIS — L03116 Cellulitis of left lower limb: Secondary | ICD-10-CM

## 2012-11-28 DIAGNOSIS — L03119 Cellulitis of unspecified part of limb: Secondary | ICD-10-CM

## 2012-11-28 DIAGNOSIS — L98499 Non-pressure chronic ulcer of skin of other sites with unspecified severity: Secondary | ICD-10-CM

## 2012-11-28 DIAGNOSIS — L98491 Non-pressure chronic ulcer of skin of other sites limited to breakdown of skin: Secondary | ICD-10-CM

## 2012-11-28 DIAGNOSIS — L02619 Cutaneous abscess of unspecified foot: Secondary | ICD-10-CM

## 2012-11-28 DIAGNOSIS — G629 Polyneuropathy, unspecified: Secondary | ICD-10-CM

## 2012-11-28 DIAGNOSIS — G609 Hereditary and idiopathic neuropathy, unspecified: Secondary | ICD-10-CM

## 2012-11-28 MED ORDER — GABAPENTIN 100 MG PO CAPS: 100.0000 mg | ORAL_CAPSULE | Freq: Two times a day (BID) | ORAL | Status: DC

## 2012-11-28 MED ORDER — GABAPENTIN 400 MG PO CAPS: 400.0000 mg | ORAL_CAPSULE | Freq: Every day | ORAL | Status: DC

## 2012-11-28 NOTE — Patient Instructions (Signed)
It was good to see you today. We have reviewed your prior records including labs and tests today And updated, begin gabapentin 100 mg a.m. and 100mg  midday,  continue gabapentin 400 mg at bedtime.  Complete antibiotics as prescribed by emergency room.  No other medication changes Prescription sent to your local pharmacy, please call us if mail-order prescription needed Soak her feet each night in warm soapy water for 10 minutes, and then pat dry and elevate for bed Continue compression socks each morning  And wear until evening

## 2012-11-28 NOTE — Progress Notes (Signed)
Subjective:    Patient ID: Nicole Roberts, female    DOB: [birth year], 79 y.o.   MRN: [id number]  HPI  Here for follow up LLE cellulitis, ER visit for same 7/26 reviewed Also reviewed chronic medical issues and interval medical events   Past Medical History  Diagnosis Date  . HEARING LOSS   . MITRAL VALVE INSUFF&AORTIC VALVE INSUFF     moderate MR  . PULMONARY HYPERTENSION   . Atrial fibrillation     permanent  . SICK SINUS SYNDROME     a. Tachybrady syndrome - Guidant PPM 10/2005.  . Edema 03/19/2009    due to venous insufficiency, chronic lymphedema  . URINARY INCONTINENCE   . Rheumatoid arthritis(714.0) dx clarified 2011    On prednisone  . BREAST CANCER 12/2006    ductal ca s/p L lumpectomy  . Diastolic dysfunction   . HYPERTENSION   . HYPERLIPIDEMIA   . GERD   . HYPOTHYROIDISM   . Hyperparathyroidism     2 lobes removed  . Venous insufficiency     chronic BLE edema  . Spinal stenosis   . Anemia     a. Macrocytic - normal B12, folate 08/2011. b. 3/6 heme positive stools 10/2011 - per PCP note, elected against colonscopy.  [name] TIA (transient ischemic attack) 05/2011  . Anxiety      Review of Systems  Constitutional: Positive for fatigue. Negative for fever, activity change and appetite change.  HENT: Positive for hearing loss (on R, chronic and progressive, >L).   Respiratory: Negative for cough and shortness of breath.   Cardiovascular: Positive for leg swelling (chronic BLE, but currently improved since SNF stay). Negative for chest pain.  Musculoskeletal: Positive for arthralgias. Negative for joint swelling.       Objective:   Physical Exam  BP 110/68  Pulse 67  Temp(Src) 98.6 F (37 C) (Oral)  Wt 148 lb 6.4 oz (67.314 kg)  BMI 25.46 kg/m2  SpO2 93% Wt Readings from Last 3 Encounters:  11/28/12 148 lb 6.4 oz (67.314 kg)  10/29/12 126 lb 12.8 oz (57.516 kg)  10/15/12 126 lb 12.8 oz (57.516 kg)   Constitutional: She appears well-developed and  well-nourished. No distress. daughter-in-law at side Cardiovascular: Normal rate, regular rhythm and normal heart sounds.  No murmur heard. chronic BLE edema, currently improved compared to baseline -wearing compression hose bilaterally Pulmonary/Chest: Effort normal and breath sounds normal. No respiratory distress. She has no wheeze. Skin: no cellulitis on LLE/foot - 3rd toe with toe spacer, no erythema, abnormal warmth - tips of 2nd and 3rd toes with ulceration at site of corn/callous (removed by podiatry -hx per dtr in law) Skin:  Psychiatric: She has a normal mood and affect. Her behavior is normal. Judgment and thought content normal.    Lab Results  Component Value Date   WBC 4.3 11/24/2012   HGB 13.5 11/24/2012   HCT 39.4 11/24/2012   PLT 102* 11/24/2012   CHOL 143 03/20/2012   TRIG 77 03/20/2012   HDL 70 03/20/2012   LDLDIRECT 131.4 12/28/2009   ALT 38* 09/17/2012   AST 21 09/17/2012   NA 140 11/24/2012   K 3.4* 11/24/2012   CL 102 11/24/2012   CREATININE 1.09 11/24/2012   BUN 23 11/24/2012   CO2 29 11/24/2012   TSH 1.058 09/17/2012   INR 1.27 03/19/2012   Lab Results  Component Value Date   IRON 109 05/30/2011       Assessment & Plan:   See problem  list. Medications and labs reviewed today.  Cellulitis, LLE, recurrent - er visit 11/24/12 reviewed, improved/ resolved with antibiotic therapy  Hx same 08/2012 with hospitalization and SNF for same - reviewed Callous/corn removal by podiatry precipitatin same - advised nightly warm soapy water soaks, avoid pressure on toes and call if any redness or pain change  Peripheral neuropathy - on gabapentin - titrate as tolerated by sedation side effects   Time spent with pt/family today 25 minutes, greater than 50% time spent counseling patient on recurrent cellulitis, wound care, chronic lymphedema and medication review. Also review of ER records

## 2012-12-03 ENCOUNTER — Other Ambulatory Visit: Payer: Self-pay | Admitting: *Deleted

## 2012-12-03 MED ORDER — POTASSIUM CHLORIDE ER 10 MEQ PO TBCR: 20.0000 meq | EXTENDED_RELEASE_TABLET | Freq: Every day | ORAL | Status: DC

## 2012-12-04 ENCOUNTER — Telehealth: Payer: Self-pay | Admitting: *Deleted

## 2012-12-04 NOTE — Telephone Encounter (Signed)
Left msg on robin vm wanting to know if mom can increase lasix to 60 mg due to edema in her legs...lmb

## 2012-12-05 NOTE — Telephone Encounter (Signed)
Notified Barbara with md response...Raechel Chute

## 2012-12-05 NOTE — Telephone Encounter (Signed)
Ok to take 60mg  qd x 5 days, or until edema improves, then resume usual dose thanks

## 2012-12-13 ENCOUNTER — Inpatient Hospital Stay (HOSPITAL_COMMUNITY)
Admission: EM | Admit: 2012-12-13 | Discharge: 2012-12-24 | DRG: 871 | Disposition: A | Discharge status: Skilled Nursing Facility | Payer: Medicare Other | Attending: Internal Medicine | Admitting: Internal Medicine

## 2012-12-13 ENCOUNTER — Emergency Department (HOSPITAL_COMMUNITY): Payer: Medicare Other

## 2012-12-13 ENCOUNTER — Encounter (HOSPITAL_COMMUNITY): Payer: Self-pay

## 2012-12-13 DIAGNOSIS — Z515 Encounter for palliative care: Secondary | ICD-10-CM

## 2012-12-13 DIAGNOSIS — B370 Candidal stomatitis: Secondary | ICD-10-CM | POA: Diagnosis present

## 2012-12-13 DIAGNOSIS — A419 Sepsis, unspecified organism: Principal | ICD-10-CM | POA: Diagnosis present

## 2012-12-13 DIAGNOSIS — M069 Rheumatoid arthritis, unspecified: Secondary | ICD-10-CM | POA: Diagnosis present

## 2012-12-13 DIAGNOSIS — R6521 Severe sepsis with septic shock: Secondary | ICD-10-CM | POA: Diagnosis present

## 2012-12-13 DIAGNOSIS — N179 Acute kidney failure, unspecified: Secondary | ICD-10-CM | POA: Diagnosis present

## 2012-12-13 DIAGNOSIS — N390 Urinary tract infection, site not specified: Secondary | ICD-10-CM | POA: Diagnosis present

## 2012-12-13 DIAGNOSIS — E785 Hyperlipidemia, unspecified: Secondary | ICD-10-CM | POA: Diagnosis present

## 2012-12-13 DIAGNOSIS — Z7901 Long term (current) use of anticoagulants: Secondary | ICD-10-CM

## 2012-12-13 DIAGNOSIS — Z66 Do not resuscitate: Secondary | ICD-10-CM | POA: Diagnosis present

## 2012-12-13 DIAGNOSIS — D6959 Other secondary thrombocytopenia: Secondary | ICD-10-CM | POA: Diagnosis present

## 2012-12-13 DIAGNOSIS — E039 Hypothyroidism, unspecified: Secondary | ICD-10-CM | POA: Diagnosis present

## 2012-12-13 DIAGNOSIS — L02419 Cutaneous abscess of limb, unspecified: Secondary | ICD-10-CM | POA: Diagnosis present

## 2012-12-13 DIAGNOSIS — E875 Hyperkalemia: Secondary | ICD-10-CM | POA: Diagnosis not present

## 2012-12-13 DIAGNOSIS — N183 Chronic kidney disease, stage 3 unspecified: Secondary | ICD-10-CM | POA: Diagnosis present

## 2012-12-13 DIAGNOSIS — E876 Hypokalemia: Secondary | ICD-10-CM | POA: Diagnosis present

## 2012-12-13 DIAGNOSIS — R609 Edema, unspecified: Secondary | ICD-10-CM | POA: Diagnosis present

## 2012-12-13 DIAGNOSIS — I10 Essential (primary) hypertension: Secondary | ICD-10-CM

## 2012-12-13 DIAGNOSIS — Z853 Personal history of malignant neoplasm of breast: Secondary | ICD-10-CM

## 2012-12-13 DIAGNOSIS — I129 Hypertensive chronic kidney disease with stage 1 through stage 4 chronic kidney disease, or unspecified chronic kidney disease: Secondary | ICD-10-CM | POA: Diagnosis present

## 2012-12-13 DIAGNOSIS — Z950 Presence of cardiac pacemaker: Secondary | ICD-10-CM

## 2012-12-13 DIAGNOSIS — D539 Nutritional anemia, unspecified: Secondary | ICD-10-CM | POA: Diagnosis present

## 2012-12-13 DIAGNOSIS — I2789 Other specified pulmonary heart diseases: Secondary | ICD-10-CM | POA: Diagnosis present

## 2012-12-13 DIAGNOSIS — R5381 Other malaise: Secondary | ICD-10-CM | POA: Diagnosis present

## 2012-12-13 DIAGNOSIS — F3289 Other specified depressive episodes: Secondary | ICD-10-CM | POA: Diagnosis present

## 2012-12-13 DIAGNOSIS — I872 Venous insufficiency (chronic) (peripheral): Secondary | ICD-10-CM | POA: Diagnosis present

## 2012-12-13 DIAGNOSIS — R6520 Severe sepsis without septic shock: Secondary | ICD-10-CM | POA: Diagnosis present

## 2012-12-13 DIAGNOSIS — I509 Heart failure, unspecified: Secondary | ICD-10-CM | POA: Diagnosis present

## 2012-12-13 DIAGNOSIS — K219 Gastro-esophageal reflux disease without esophagitis: Secondary | ICD-10-CM | POA: Diagnosis present

## 2012-12-13 DIAGNOSIS — L0390 Cellulitis, unspecified: Secondary | ICD-10-CM

## 2012-12-13 DIAGNOSIS — D696 Thrombocytopenia, unspecified: Secondary | ICD-10-CM

## 2012-12-13 DIAGNOSIS — T50995A Adverse effect of other drugs, medicaments and biological substances, initial encounter: Secondary | ICD-10-CM | POA: Diagnosis present

## 2012-12-13 DIAGNOSIS — F329 Major depressive disorder, single episode, unspecified: Secondary | ICD-10-CM | POA: Diagnosis present

## 2012-12-13 DIAGNOSIS — L03119 Cellulitis of unspecified part of limb: Secondary | ICD-10-CM | POA: Diagnosis present

## 2012-12-13 DIAGNOSIS — I5023 Acute on chronic systolic (congestive) heart failure: Secondary | ICD-10-CM | POA: Diagnosis present

## 2012-12-13 DIAGNOSIS — I4891 Unspecified atrial fibrillation: Secondary | ICD-10-CM | POA: Diagnosis present

## 2012-12-13 DIAGNOSIS — H9190 Unspecified hearing loss, unspecified ear: Secondary | ICD-10-CM | POA: Diagnosis present

## 2012-12-13 DIAGNOSIS — R509 Fever, unspecified: Secondary | ICD-10-CM

## 2012-12-13 DIAGNOSIS — E43 Unspecified severe protein-calorie malnutrition: Secondary | ICD-10-CM | POA: Diagnosis present

## 2012-12-13 LAB — CBC WITH DIFFERENTIAL/PLATELET
Basophils Absolute: 0 10*3/uL (ref 0.0–0.1)
Eosinophils Absolute: 0 10*3/uL (ref 0.0–0.7)
HCT: 31.4 % — ABNORMAL LOW (ref 36.0–46.0)
Hemoglobin: 10.2 g/dL — ABNORMAL LOW (ref 12.0–15.0)
Lymphocytes Relative: 7 % — ABNORMAL LOW (ref 12–46)
Lymphocytes Relative: 6 % — ABNORMAL LOW (ref 12–46)
Lymphs Abs: 0.8 10*3/uL (ref 0.7–4.0)
Lymphs Abs: 1 10*3/uL (ref 0.7–4.0)
MCH: 35.6 pg — ABNORMAL HIGH (ref 26.0–34.0)
Monocytes Relative: 4 % (ref 3–12)
Neutro Abs: 9.7 10*3/uL — ABNORMAL HIGH (ref 1.7–7.7)
Neutrophils Relative %: 88 % — ABNORMAL HIGH (ref 43–77)
Neutrophils Relative %: 92 % — ABNORMAL HIGH (ref 43–77)
Platelets: 150 10*3/uL (ref 150–400)
RBC: 2.93 MIL/uL — ABNORMAL LOW (ref 3.87–5.11)
RBC: 2.25 MIL/uL — ABNORMAL LOW (ref 3.87–5.11)
RDW: 18.2 % — ABNORMAL HIGH (ref 11.5–15.5)
WBC: 17.4 10*3/uL — ABNORMAL HIGH (ref 4.0–10.5)

## 2012-12-13 LAB — URINALYSIS, ROUTINE W REFLEX MICROSCOPIC
Glucose, UA: NEGATIVE mg/dL
Hgb urine dipstick: NEGATIVE
Specific Gravity, Urine: 1.015 (ref 1.005–1.030)
Urobilinogen, UA: 0.2 mg/dL (ref 0.0–1.0)
pH: 5 (ref 5.0–8.0)

## 2012-12-13 LAB — BLOOD GAS, ARTERIAL
Drawn by: 310571
TCO2: 21.6 mmol/L (ref 0–100)
pCO2 arterial: 36.5 mmHg (ref 35.0–45.0)
pH, Arterial: 7.429 (ref 7.350–7.450)

## 2012-12-13 LAB — BASIC METABOLIC PANEL
Calcium: 7.2 mg/dL — ABNORMAL LOW (ref 8.4–10.5)
GFR calc non Af Amer: 35 mL/min — ABNORMAL LOW (ref >90)
Glucose, Bld: 103 mg/dL — ABNORMAL HIGH (ref 70–99)
Potassium: 3.7 mEq/L (ref 3.5–5.1)
Sodium: 137 mEq/L (ref 135–145)

## 2012-12-13 LAB — HEPATIC FUNCTION PANEL
ALT: 18 U/L (ref 0–35)
AST: 20 U/L (ref 0–37)
Albumin: 3.2 g/dL — ABNORMAL LOW (ref 3.5–5.2)
Total Protein: 6.2 g/dL (ref 6.0–8.3)

## 2012-12-13 LAB — POCT I-STAT, CHEM 8
Chloride: 104 mEq/L (ref 96–112)
Creatinine, Ser: 1.2 mg/dL — ABNORMAL HIGH (ref 0.50–1.10)
Hemoglobin: 11.2 g/dL — ABNORMAL LOW (ref 12.0–15.0)
Potassium: 3.4 mEq/L — ABNORMAL LOW (ref 3.5–5.1)
Sodium: 140 mEq/L (ref 135–145)

## 2012-12-13 LAB — MRSA PCR SCREENING: MRSA by PCR: NEGATIVE

## 2012-12-13 LAB — LACTIC ACID, PLASMA: Lactic Acid, Venous: 2.2 mmol/L (ref 0.5–2.2)

## 2012-12-13 LAB — GLUCOSE, CAPILLARY: Glucose-Capillary: 70 mg/dL (ref 70–99)

## 2012-12-13 MED ORDER — METOPROLOL SUCCINATE ER 50 MG PO TB24
50.0000 mg | ORAL_TABLET | Freq: Two times a day (BID) | ORAL | Status: DC
  Filled 2012-12-13 (×2): qty 1

## 2012-12-13 MED ORDER — FOLIC ACID 400 MCG PO TABS: 400.0000 ug | ORAL_TABLET | Freq: Every day | ORAL | Status: DC

## 2012-12-13 MED ORDER — CALCIUM CITRATE-VITAMIN D 315-200 MG-UNIT PO TABS: 1.0000 | ORAL_TABLET | Freq: Two times a day (BID) | ORAL | Status: DC

## 2012-12-13 MED ORDER — PREDNISONE 5 MG PO TABS
5.0000 mg | ORAL_TABLET | Freq: Every day | ORAL | Status: DC
  Administered 2012-12-13: 5 mg via ORAL
  Filled 2012-12-13 (×2): qty 1

## 2012-12-13 MED ORDER — LEVOTHYROXINE SODIUM 25 MCG PO TABS
25.0000 ug | ORAL_TABLET | Freq: Every day | ORAL | Status: DC
  Administered 2012-12-14 – 2012-12-24 (×10): 25 ug via ORAL
  Filled 2012-12-13 (×13): qty 1

## 2012-12-13 MED ORDER — CYANOCOBALAMIN 500 MCG PO TABS
500.0000 ug | ORAL_TABLET | Freq: Every day | ORAL | Status: DC
  Administered 2012-12-13 – 2012-12-24 (×12): 500 ug via ORAL
  Filled 2012-12-13 (×12): qty 1

## 2012-12-13 MED ORDER — SODIUM CHLORIDE 0.9 % IV SOLN
250.0000 mL | INTRAVENOUS | Status: DC | PRN
  Administered 2012-12-14: 100 mL/h via INTRAVENOUS
  Administered 2012-12-14: 12:00:00 via INTRAVENOUS

## 2012-12-13 MED ORDER — SODIUM CHLORIDE 0.9 % IV BOLUS (SEPSIS)
500.0000 mL | Freq: Once | INTRAVENOUS | Status: AC
  Administered 2012-12-13: 500 mL via INTRAVENOUS

## 2012-12-13 MED ORDER — PIPERACILLIN-TAZOBACTAM 3.375 G IVPB
3.3750 g | Freq: Once | INTRAVENOUS | Status: AC
  Administered 2012-12-13: 3.375 g via INTRAVENOUS
  Filled 2012-12-13: qty 50

## 2012-12-13 MED ORDER — PANTOPRAZOLE SODIUM 40 MG PO TBEC
40.0000 mg | DELAYED_RELEASE_TABLET | Freq: Every day | ORAL | Status: DC
  Administered 2012-12-13 – 2012-12-24 (×12): 40 mg via ORAL
  Filled 2012-12-13 (×12): qty 1

## 2012-12-13 MED ORDER — FUROSEMIDE 20 MG PO TABS
20.0000 mg | ORAL_TABLET | Freq: Two times a day (BID) | ORAL | Status: DC
  Filled 2012-12-13 (×2): qty 1

## 2012-12-13 MED ORDER — ATORVASTATIN CALCIUM 10 MG PO TABS
10.0000 mg | ORAL_TABLET | Freq: Every day | ORAL | Status: DC
  Administered 2012-12-13 – 2012-12-23 (×11): 10 mg via ORAL
  Filled 2012-12-13 (×13): qty 1

## 2012-12-13 MED ORDER — FOLIC ACID 0.5 MG HALF TAB
0.5000 mg | ORAL_TABLET | Freq: Every day | ORAL | Status: DC
  Administered 2012-12-13 – 2012-12-24 (×12): 0.5 mg via ORAL
  Filled 2012-12-13 (×12): qty 1

## 2012-12-13 MED ORDER — POTASSIUM CHLORIDE ER 10 MEQ PO TBCR
20.0000 meq | EXTENDED_RELEASE_TABLET | Freq: Every day | ORAL | Status: DC
  Filled 2012-12-13: qty 2

## 2012-12-13 MED ORDER — LEFLUNOMIDE 20 MG PO TABS
20.0000 mg | ORAL_TABLET | Freq: Every day | ORAL | Status: DC
  Filled 2012-12-13: qty 1

## 2012-12-13 MED ORDER — ONDANSETRON HCL 4 MG/2ML IJ SOLN: 4.0000 mg | Freq: Four times a day (QID) | INTRAMUSCULAR | Status: DC | PRN

## 2012-12-13 MED ORDER — GABAPENTIN 100 MG PO CAPS
100.0000 mg | ORAL_CAPSULE | Freq: Two times a day (BID) | ORAL | Status: DC
  Filled 2012-12-13 (×2): qty 1

## 2012-12-13 MED ORDER — SODIUM CHLORIDE 0.9 % IJ SOLN: 3.0000 mL | INTRAMUSCULAR | Status: DC | PRN

## 2012-12-13 MED ORDER — POTASSIUM CHLORIDE CRYS ER 20 MEQ PO TBCR
20.0000 meq | EXTENDED_RELEASE_TABLET | Freq: Two times a day (BID) | ORAL | Status: DC
  Administered 2012-12-13: 20 meq via ORAL
  Filled 2012-12-13 (×2): qty 1

## 2012-12-13 MED ORDER — LISINOPRIL 5 MG PO TABS
5.0000 mg | ORAL_TABLET | Freq: Every day | ORAL | Status: DC
  Filled 2012-12-13: qty 1

## 2012-12-13 MED ORDER — CITALOPRAM HYDROBROMIDE 40 MG PO TABS
40.0000 mg | ORAL_TABLET | Freq: Every day | ORAL | Status: DC
  Administered 2012-12-13: 40 mg via ORAL
  Filled 2012-12-13 (×2): qty 1

## 2012-12-13 MED ORDER — DIPHENOXYLATE-ATROPINE 2.5-0.025 MG PO TABS: 1.0000 | ORAL_TABLET | Freq: Four times a day (QID) | ORAL | Status: DC | PRN

## 2012-12-13 MED ORDER — NITROGLYCERIN 0.4 MG SL SUBL: 0.4000 mg | SUBLINGUAL_TABLET | SUBLINGUAL | Status: DC | PRN

## 2012-12-13 MED ORDER — ISOSORBIDE MONONITRATE 15 MG HALF TABLET
15.0000 mg | ORAL_TABLET | Freq: Every day | ORAL | Status: DC
  Administered 2012-12-13 – 2012-12-24 (×12): 15 mg via ORAL
  Filled 2012-12-13 (×12): qty 1

## 2012-12-13 MED ORDER — HYDROCORTISONE SOD SUCCINATE 100 MG IJ SOLR
50.0000 mg | Freq: Four times a day (QID) | INTRAMUSCULAR | Status: DC
  Administered 2012-12-13 – 2012-12-19 (×23): 50 mg via INTRAVENOUS
  Filled 2012-12-13 (×28): qty 1

## 2012-12-13 MED ORDER — ONDANSETRON HCL 4 MG PO TABS: 4.0000 mg | ORAL_TABLET | Freq: Four times a day (QID) | ORAL | Status: DC | PRN

## 2012-12-13 MED ORDER — ALUM & MAG HYDROXIDE-SIMETH 200-200-20 MG/5ML PO SUSP: 30.0000 mL | Freq: Four times a day (QID) | ORAL | Status: DC | PRN

## 2012-12-13 MED ORDER — LOPERAMIDE HCL 2 MG PO CAPS: 2.0000 mg | ORAL_CAPSULE | ORAL | Status: DC | PRN

## 2012-12-13 MED ORDER — POTASSIUM CHLORIDE ER 10 MEQ PO TBCR
20.0000 meq | EXTENDED_RELEASE_TABLET | Freq: Two times a day (BID) | ORAL | Status: DC
  Administered 2012-12-13: 20 meq via ORAL
  Filled 2012-12-13 (×2): qty 2

## 2012-12-13 MED ORDER — DABIGATRAN ETEXILATE MESYLATE 75 MG PO CAPS
75.0000 mg | ORAL_CAPSULE | Freq: Two times a day (BID) | ORAL | Status: DC
  Administered 2012-12-13 – 2012-12-24 (×23): 75 mg via ORAL
  Filled 2012-12-13 (×24): qty 1

## 2012-12-13 MED ORDER — SODIUM CHLORIDE 0.9 % IV SOLN: 250.0000 mL | INTRAVENOUS | Status: DC | PRN

## 2012-12-13 MED ORDER — PHENYLEPHRINE HCL 10 MG/ML IJ SOLN
30.0000 ug/min | INTRAVENOUS | Status: DC
  Administered 2012-12-13: 30 ug/min via INTRAVENOUS
  Administered 2012-12-14: 50 ug/min via INTRAVENOUS
  Administered 2012-12-14: 30 ug/min via INTRAVENOUS
  Administered 2012-12-14: 40 ug/min via INTRAVENOUS
  Filled 2012-12-13 (×5): qty 1

## 2012-12-13 MED ORDER — SODIUM CHLORIDE 0.9 % IJ SOLN
3.0000 mL | Freq: Two times a day (BID) | INTRAMUSCULAR | Status: DC
  Administered 2012-12-13 – 2012-12-22 (×11): 3 mL via INTRAVENOUS

## 2012-12-13 MED ORDER — SODIUM CHLORIDE 0.9 % IV BOLUS (SEPSIS): 500.0000 mL | INTRAVENOUS | Status: DC

## 2012-12-13 MED ORDER — VANCOMYCIN HCL IN DEXTROSE 1-5 GM/200ML-% IV SOLN
1000.0000 mg | Freq: Once | INTRAVENOUS | Status: AC
  Administered 2012-12-13: 1000 mg via INTRAVENOUS
  Filled 2012-12-13: qty 200

## 2012-12-13 MED ORDER — SUCRALFATE 1 GM/10ML PO SUSP
1.0000 g | Freq: Three times a day (TID) | ORAL | Status: DC
  Administered 2012-12-13 – 2012-12-24 (×43): 1 g via ORAL
  Filled 2012-12-13 (×49): qty 10

## 2012-12-13 MED ORDER — CALCIUM CARBONATE-VITAMIN D 500-200 MG-UNIT PO TABS
1.0000 | ORAL_TABLET | Freq: Two times a day (BID) | ORAL | Status: DC
  Administered 2012-12-13 – 2012-12-24 (×22): 1 via ORAL
  Filled 2012-12-13 (×25): qty 1

## 2012-12-13 MED ORDER — PIPERACILLIN-TAZOBACTAM 3.375 G IVPB
3.3750 g | Freq: Three times a day (TID) | INTRAVENOUS | Status: DC
  Administered 2012-12-13 – 2012-12-20 (×20): 3.375 g via INTRAVENOUS
  Filled 2012-12-13 (×21): qty 50

## 2012-12-13 MED ORDER — HYDROCODONE-ACETAMINOPHEN 5-325 MG PO TABS
1.0000 | ORAL_TABLET | Freq: Four times a day (QID) | ORAL | Status: DC | PRN
  Administered 2012-12-13 – 2012-12-18 (×5): 1 via ORAL
  Filled 2012-12-13 (×7): qty 1

## 2012-12-13 MED ORDER — ACETAMINOPHEN 650 MG RE SUPP
RECTAL | Status: AC
  Filled 2012-12-13: qty 1

## 2012-12-13 MED ORDER — VANCOMYCIN HCL IN DEXTROSE 1-5 GM/200ML-% IV SOLN
1000.0000 mg | INTRAVENOUS | Status: AC
  Administered 2012-12-14: 1000 mg via INTRAVENOUS
  Filled 2012-12-13 (×2): qty 200

## 2012-12-13 MED ORDER — GABAPENTIN 100 MG PO CAPS
100.0000 mg | ORAL_CAPSULE | Freq: Two times a day (BID) | ORAL | Status: DC
  Administered 2012-12-13 – 2012-12-24 (×22): 100 mg via ORAL
  Filled 2012-12-13 (×24): qty 1

## 2012-12-13 MED ORDER — GABAPENTIN 400 MG PO CAPS
400.0000 mg | ORAL_CAPSULE | Freq: Every day | ORAL | Status: DC
  Administered 2012-12-13 – 2012-12-17 (×5): 400 mg via ORAL
  Filled 2012-12-13 (×6): qty 1

## 2012-12-13 MED ORDER — ACETAMINOPHEN 500 MG PO TABS
500.0000 mg | ORAL_TABLET | Freq: Four times a day (QID) | ORAL | Status: DC | PRN
  Administered 2012-12-18: 500 mg via ORAL
  Filled 2012-12-13: qty 1

## 2012-12-13 MED ORDER — ACETAMINOPHEN 650 MG RE SUPP
650.0000 mg | Freq: Once | RECTAL | Status: AC
  Administered 2012-12-13: 650 mg via RECTAL

## 2012-12-13 NOTE — ED Provider Notes (Signed)
CSN: [id number]     Arrival date & time 12/13/12  0522 History     First MD Initiated Contact with Patient 12/13/12 [phone]     Chief Complaint  Patient presents with  . Generalized Body Aches  . Chills   (Consider location/radiation/quality/duration/timing/severity/associated sxs/prior Treatment) HPI Patient presents to the emergency department with fever and weakness over the last 3 days.  Patient, states, that she's feeling weak, with fever, and denies chest pain, shortness of breath, headache, blurred vision, dizziness, syncope, rash, nausea, vomiting, or diarrhea.  Patient, states she did not take any medications prior to arrival.  Patient, states, that nothing seems to make her condition, better or worse.  Patient lives at home with her husband Past Medical History  Diagnosis Date  . HEARING LOSS   . MITRAL VALVE INSUFF&AORTIC VALVE INSUFF     moderate MR  . PULMONARY HYPERTENSION   . Atrial fibrillation     permanent  . SICK SINUS SYNDROME     a. Tachybrady syndrome - Guidant PPM 10/2005.  . Edema 03/19/2009    due to venous insufficiency, chronic lymphedema  . URINARY INCONTINENCE   . Rheumatoid arthritis(714.0) dx clarified 2011    On prednisone  . BREAST CANCER 12/2006    ductal ca s/p L lumpectomy  . Diastolic dysfunction   . HYPERTENSION   . HYPERLIPIDEMIA   . GERD   . HYPOTHYROIDISM   . Hyperparathyroidism     2 lobes removed  . Venous insufficiency     chronic BLE edema  . Spinal stenosis   . Anemia     a. Macrocytic - normal B12, folate 08/2011. b. 3/6 heme positive stools 10/2011 - per PCP note, elected against colonscopy.  [name] TIA (transient ischemic attack) 05/2011  . Anxiety    Past Surgical History  Procedure Laterality Date  . Pacemaker placement  2005    SSS and syncope  . Cholecystectomy  04/06/99  . Abdominal hysterectomy  1970    Partial  . Left hip arthroscopy  1995  . Right hip arthroscopy  01/2001  . Carpal tunnel release  1998    bilateral  .  Breast surgery  12/2006    Left breast lupectomy- Ductal CA  . Left parathyroidectomy  07/2008  . Esophagogastroduodenoscopy N/A 10/18/2012    Procedure: ESOPHAGOGASTRODUODENOSCOPY (EGD);  Surgeon: Meryl Dare, MD;  Location: [facility];  Service: Endoscopy;  Laterality: N/A;   Family History  Problem Relation Age of Onset  . Ovarian cancer Mother   . Coronary artery disease Sister   . Coronary artery disease Brother   . Hypertension Mother     grandparent  . Lung cancer Father    History  Substance Use Topics  . Smoking status: Never Smoker   . Smokeless tobacco: Never Used     Comment: Lives at [facility] since 03/2009- married, lives with spouse. Moved here from [facility] to be near son  . Alcohol Use: No   OB History   Grav Para Term Preterm Abortions TAB SAB Ect Mult Living                 Review of Systems All other systems negative except as documented in the HPI. All pertinent positives and negatives as reviewed in the HPI. Allergies  Sulfa antibiotics and Tramadol hcl  Home Medications  No current outpatient prescriptions on file. BP 98/50  Pulse 70  Temp(Src) 98.7 F (37.1 C) (Oral)  Resp 18  Ht 5'  4.17" (1.63 m)  Wt 148 lb 6.4 oz (67.314 kg)  BMI 25.34 kg/m2  SpO2 93% Physical Exam  Nursing note and vitals reviewed. Constitutional: She is oriented to person, place, and time. She appears well-developed and well-nourished.  HENT:  Head: Normocephalic and atraumatic.  Mouth/Throat: Oropharynx is clear and moist.  Eyes: EOM are normal. Pupils are equal, round, and reactive to light.  Neck: Normal range of motion. Neck supple.  Cardiovascular: Normal rate, regular rhythm and normal heart sounds.  Exam reveals no gallop and no friction rub.   No murmur heard. Pulmonary/Chest: Effort normal and breath sounds normal.  Abdominal: Soft. Bowel sounds are normal. She exhibits no distension. There is no tenderness. There is no rebound and no  guarding.  Neurological: She is alert and oriented to person, place, and time. She exhibits normal muscle tone. Coordination normal.  Skin: Skin is warm and dry. No rash noted. No erythema.    ED Course   Procedures (including critical care time)  Labs Reviewed  CBC WITH DIFFERENTIAL - Abnormal; Notable for the following:    WBC 11.0 (*)    RBC 2.93 (*)    Hemoglobin 10.2 (*)    HCT 31.4 (*)    MCV 107.2 (*)    MCH 34.8 (*)    RDW 17.9 (*)    Neutrophils Relative % 88 (*)    Neutro Abs 9.7 (*)    Lymphocytes Relative 7 (*)    All other components within normal limits  HEPATIC FUNCTION PANEL - Abnormal; Notable for the following:    Albumin 3.2 (*)    All other components within normal limits  PRO B NATRIURETIC PEPTIDE - Abnormal; Notable for the following:    Pro B Natriuretic peptide (BNP) 7685.0 (*)    All other components within normal limits  POCT I-STAT, CHEM 8 - Abnormal; Notable for the following:    Potassium 3.4 (*)    Creatinine, Ser 1.20 (*)    Glucose, Bld 101 (*)    Calcium, Ion 0.80 (*)    Hemoglobin 11.2 (*)    HCT 33.0 (*)    All other components within normal limits  CG4 I-STAT (LACTIC ACID) - Abnormal; Notable for the following:    Lactic Acid, Venous 3.71 (*)    All other components within normal limits  MRSA PCR SCREENING  CULTURE, BLOOD (ROUTINE X 2)  CULTURE, BLOOD (ROUTINE X 2)  URINALYSIS, ROUTINE W REFLEX MICROSCOPIC  BLOOD GAS, ARTERIAL  GLUCOSE, CAPILLARY   Dg Chest 2 View  12/13/2012   *RADIOLOGY REPORT*  Clinical Data: Fever  CHEST - 2 VIEW  Comparison:  Study obtained earlier in the day and March 19, 2012  Findings:  There is a degree of underlying emphysematous change. There is mild scarring bilaterally.  There is no edema or consolidation. Heart is mildly enlarged with normal pulmonary vascularity.  Pacemaker leads are attached to the right atrium and right ventricle.  There is degenerative change in the thoracic spine.  No adenopathy.   IMPRESSION: Underlying emphysematous change.  No edema or consolidation.   Original Report Authenticated By: Bretta Bang, M.D.   Dg Chest Portable 1 View  12/13/2012   *RADIOLOGY REPORT*  Clinical Data: Bodyaches.  Shortness of breath.  Weakness.  PORTABLE CHEST - 1 VIEW  Comparison: Chest x-ray 03/19/2012.  Findings: Mild diffuse peribronchial cuffing.  No acute consolidative airspace disease.  No pleural effusions.  Lung volumes are normal.  No evidence of pulmonary edema.  Heart size cm borderline enlarged. The patient is rotated to the left on today's exam, resulting in distortion of the mediastinal contours and reduced diagnostic sensitivity and specificity for mediastinal pathology.  Atherosclerosis in the thoracic aorta.  Left-sided pacemaker device in place with lead tips projecting over the expected location of the right atrium and right ventricular apex.  IMPRESSION: 1.  Mild diffuse peribronchial cuffing may suggest bronchitis. 2.  Borderline cardiomegaly. 3.  Atherosclerosis.   Original Report Authenticated By: Trudie Reed, M.D.   1. Fever    do to the hospital for further evaluation care, [facility], who will see the patient in the hospital.  Patient has been stable except for her pulse oximetry, which was initially low, but is improved.  MDM  MDM Reviewed: vitals and nursing note Interpretation: labs, ECG and x-ray Consults: admitting MD   Date: 12/13/2012  Rate: 88 m: atrial fibrillation  QRS Axis: normal  Intervals: normal  ST/T Wave abnormalities: nonspecific ST/T changes  Conduction Disutrbances:none  Narrative Interpretation:   Old EKG Reviewed: unchanged      Carlyle Dolly, PA-C 12/13/12 1547

## 2012-12-13 NOTE — ED Notes (Signed)
Bed: RU04 Expected date: 12/13/12 Expected time: 5:09 AM Means of arrival: Ambulance Comments: 79 yo F  Fever

## 2012-12-13 NOTE — Procedures (Signed)
Arterial Catheter Insertion Procedure Note Nicole Roberts [id number] May 14, 1921  Procedure: Insertion of Arterial Catheter  Indications: Blood pressure monitoring  Procedure Details Consent: Risks of procedure as well as the alternatives and risks of each were explained to the (patient/caregiver).  Consent for procedure obtained. Time Out: Verified patient identification, verified procedure, site/side was marked, verified correct patient position, special equipment/implants available, medications/allergies/relevent history reviewed, required imaging and test results available.  Performed  Maximum sterile technique was used including antiseptics, cap, gloves, gown, hand hygiene, mask and sheet. Skin prep: Chlorhexidine; local anesthetic administered 20 gauge catheter was inserted into right radial artery using the Seldinger technique.  Evaluation Blood flow good; BP tracing good. Complications: No apparent complications.   Berton Bon 12/13/2012

## 2012-12-13 NOTE — ED Notes (Signed)
EKG old and new given to EDP, Dierdre Highman, MD.

## 2012-12-13 NOTE — ED Notes (Signed)
2 nurses attempted to obtain IV access. Access incomplete at this time. IV RN paged for assistance.

## 2012-12-13 NOTE — Progress Notes (Signed)
UR completed 

## 2012-12-13 NOTE — Progress Notes (Addendum)
Shift event/Brief synopsis-[name] was admitted for recurrent cellulitis. Today, she spiked a fever of 103.9 and became hypotensive with decreased urine output. She received a total of 1000cc NS bolus on floor and BP was still in the 60-70 range. No urine output was recorded for day and bladder scan revealed 113 cc earlier today.  Therefore, she was transferred to ICU around 7pm shift change. Received another 1000cc NS bolus in ICU. Then, RN paged this NP secondary to pt's BP being in the 40s after the above mentioned boluses. There was a discrepancy in BP in arms. Pt was alert, oriented and asymptomatic. Ordered Neo gtt, start of another 1000cc bolus and NP to bedside.  S: Pt feels "fine". No dizziness, chest pain, SOB. Only pain is in her legs.  O: VS reviewed. BP in 40's/20's at present. Manual BP right arm in the 60's. Pt is alert and oriented and appears well given her issues. Not toxic appearing at all. Pale. Card S1S2, paced in the 60s. Resp: normal resp effort. Lungs are CTA with good air exchange. Noted mild edema to the UEs and 4+ to the LEs. Hands and toes are warm. Can not assess pedal pulses due to edema. Dsgs also on LEs. Neuro: grossly intact. No focal deficits. Psych: pt appropriate in behavior and smiling.  A/P: 1. Hypotension secondary to sepsis-Pt received total of 2500cc NS boluses today. Started Neo and BP was up to 60s. Aline placed by RT and BP with Aline 120-130. Neo is presently being weaned off and IVF returned to ordered rate of 100cc/hr. Careful with IVF as pt has hx of CHF (last EF 55-65%) and pulmonary HTN. May need to decrease IVF rate as well if BP remains stable. Holding BP meds.  2. Sepsis-likely source is cellulitis since UA and CXR neg today. R/p CBC with diff, BMP now. Pt afebrile at present. Cont Vanc and Zosyn. Blood cultures drawn and are pending.  3. CHF hx with pro-BNP approx 8000 today. See #1. Foley for strict I&O.  4. Afib s/p pacer and chronic Pradaxa. Stable,  rate controlled. When arrived on unit and saw pt, Elink/PCCM, Dr. Tyson Alias, called to make aware of situation. Agreed with plan at that time. After Aline in and pressure better, spoke again to PCCM who will not take over care at this time, but will be on standby and monitor pt as well. This NP called son and daughter-n-law (on vacation in [place]) and gave update on pt's condition. Will cont to follow pt closely tonight.  Jimmye Norman, NP [facility] Addendum/update: Neo gtt conts to keep BP up. R/p labs show Hgb 8, 8.2. Ordered hemocult cards. WBCC slightly higher than yesterday. Cont abx. Has made urine since BP better > 200cc. KJKG, NP

## 2012-12-13 NOTE — Progress Notes (Signed)
Patient arrived to unit at 1100, cane via stretcher,alert, palced comfortably in bed.- Hulda Marin RN

## 2012-12-13 NOTE — ED Notes (Addendum)
Pt transported to facility by EMS. Pt states that she has been feeling bad since last night. One 500mg  tylenol yesterday at noon. Pt c/o of body aches and chill. Temp 100.6 oral. Pt CBG 60. Pt given glucose. Vomit x2.

## 2012-12-13 NOTE — ED Notes (Signed)
Notified RN, Carollee Herter pt. Oxygen level at 85%. Pt. Placed on 2liters oxygen.

## 2012-12-13 NOTE — Progress Notes (Signed)
Patient's BP still low, Dr rama notified and ordered to give  Another 500 cc NS bolus- Hulda Marin RN

## 2012-12-13 NOTE — Progress Notes (Signed)
Patient's Bp remains low 69/43 highest wsa  80/43, after a total of 1000cc of NS bolus, bladder scan performed result was 113 ml, Dr Darnelle Catalan was notified ,ordered to transfer to [facility] notified MD about result of lactic acid this am which was 3.71. Will endorsed accordingly.- Hulda Marin RN

## 2012-12-13 NOTE — H&P (Signed)
[facility] History and Physical  Nicole Roberts WUJ:[id number] DOB: 09-Mar-1922 DOA: 12/13/2012  Referring provider: Ebbie Ridge, PA-C. PCP: [name], MD   Chief Complaint: Pain all over  History of Present Illness: Nicole Roberts is an 79 y.o. female with a PMH of atrial fibrillation, sinus syndrome status post permanent pacemaker in 2007, breast cancer status post lumpectomy, hypertension, hyperlipidemia, hypothyroidism and pulmonary hypertension who was recently treated for cellulitis.  The patient reports that she has had some fevers, but cannot specify when or how high her temperature has run.  Had a self limited episode of nausea, vomiting and diarrhea last evening, but none since.  Took some Imodium.  A review of her records reveals that she was hospitalized 09/16/2012-09/21/2012 for treatment of cellulitis and was ultimately discharged to a skilled nursing facility. She was also noted to have diarrhea during that hospitalization. Clostridium difficile studies were negative and she completed a course of 5 days of IV Zosyn and vancomycin. Doppler studies were negative for DVT. She was discharged home on an oral course of doxycycline and Cipro. She has also been under the care of gastroenterology for evaluation of weight loss and GI symptoms including intermittent nausea/vomiting/diarrhea. She underwent an EGD on 10/18/2012 which showed esophagitis for which she was treated with Carafate and PPI therapy. She presented to the ER on 11/24/2012 with recurrent cellulitis for which she was given an IV dose of Cipro and sent home on oral doxycycline. She has had ongoing erythema of the left lower leg, tenderness, and presented with the above noted complaints after calling 911. Upon initial evaluation emergency department, the patient was noted to have a fever of 102.9 rectally, and she has been referred for inpatient evaluation for her fever.  Review of Systems: Constitutional: + fever, no  chills;  Appetite normal; No weight loss, no weight gain, no fatigue.  HEENT: No blurry vision, no diplopia, no pharyngitis, no dysphagia CV: No chest pain, no palpitations.  Resp: No SOB, no cough. GI: + nausea, + vomiting, + diarrhea, no melena, no hematochezia.  GU: No dysuria, no hematuria. MSK: no myalgias, no arthralgias.  Neuro:  No headache, no focal neurological deficits, no history of seizures.  Psych: No depression, no anxiety.  Endo: No heat intolerance, no cold intolerance, no polyuria, no polydipsia  Skin: No rashes, no skin lesions.  Heme: No easy bruising.  Past Medical History Past Medical History  Diagnosis Date  . HEARING LOSS   . MITRAL VALVE INSUFF&AORTIC VALVE INSUFF     moderate MR  . PULMONARY HYPERTENSION   . Atrial fibrillation     permanent  . SICK SINUS SYNDROME     a. Tachybrady syndrome - Guidant PPM 10/2005.  . Edema 03/19/2009    due to venous insufficiency, chronic lymphedema  . URINARY INCONTINENCE   . Rheumatoid arthritis(714.0) dx clarified 2011    On prednisone  . BREAST CANCER 12/2006    ductal ca s/p L lumpectomy  . Diastolic dysfunction   . HYPERTENSION   . HYPERLIPIDEMIA   . GERD   . HYPOTHYROIDISM   . Hyperparathyroidism     2 lobes removed  . Venous insufficiency     chronic BLE edema  . Spinal stenosis   . Anemia     a. Macrocytic - normal B12, folate 08/2011. b. 3/6 heme positive stools 10/2011 - per PCP note, elected against colonscopy.  [name] TIA (transient ischemic attack) 05/2011  . Anxiety      Past Surgical History  Past Surgical History  Procedure Laterality Date  . Pacemaker placement  2005    SSS and syncope  . Cholecystectomy  04/06/99  . Abdominal hysterectomy  1970    Partial  . Left hip arthroscopy  1995  . Right hip arthroscopy  01/2001  . Carpal tunnel release  1998    bilateral  . Breast surgery  12/2006    Left breast lupectomy- Ductal CA  . Left parathyroidectomy  07/2008  . Esophagogastroduodenoscopy N/A 10/18/2012     Procedure: ESOPHAGOGASTRODUODENOSCOPY (EGD);  Surgeon: Meryl Dare, MD;  Location: [facility];  Service: Endoscopy;  Laterality: N/A;     Social History: History   Social History  . Marital Status: Married    Spouse Name: N/A    Number of Children: 6  . Years of Education: N/A   Occupational History  . Retired Futures trader    Social History Main Topics  . Smoking status: Never Smoker   . Smokeless tobacco: Never Used     Comment: Lives at [facility] since 03/2009- married, lives with spouse. Moved here from [facility] to be near son  . Alcohol Use: No  . Drug Use: No  . Sexual Activity: Not Currently   Other Topics Concern  . Not on file   Social History Narrative  . No narrative on file    Family History:  Family History  Problem Relation Age of Onset  . Ovarian cancer Mother   . Coronary artery disease Sister   . Coronary artery disease Brother   . Hypertension Mother     grandparent  . Lung cancer Father     Allergies: Sulfa antibiotics and Tramadol hcl  Meds: Prior to Admission medications   Medication Sig Start Date End Date Taking? Authorizing Provider  acetaminophen (TYLENOL) 500 MG tablet Take 500 mg by mouth every 6 (six) hours as needed for pain. For pain   Yes Historical Provider, MD  atorvastatin (LIPITOR) 10 MG tablet Take 10 mg by mouth at bedtime.   Yes Historical Provider, MD  calcium citrate-vitamin D (CITRACAL+D) 315-200 MG-UNIT per tablet Take 1 tablet by mouth 2 (two) times daily.   Yes Historical Provider, MD  citalopram (CELEXA) 40 MG tablet Take 1 tablet (40 mg total) by mouth daily. 10/29/12  Yes Newt Lukes, MD  cyanocobalamin 500 MCG tablet Take 500 mcg by mouth daily.   Yes Historical Provider, MD  dabigatran (PRADAXA) 75 MG CAPS Take 1 capsule (75 mg total) by mouth every 12 (twelve) hours. 10/29/12  Yes Newt Lukes, MD  folic acid (FOLVITE) 400 MCG tablet Take 400 mcg by mouth daily.   Yes Historical  Provider, MD  furosemide (LASIX) 20 MG tablet Take 1 tablet (20 mg total) by mouth 2 (two) times daily. 09/21/12  Yes [name], MD  gabapentin (NEURONTIN) 100 MG capsule Take 1 capsule (100 mg total) by mouth 2 (two) times daily. 11/28/12  Yes Newt Lukes, MD  gabapentin (NEURONTIN) 400 MG capsule Take 1 capsule (400 mg total) by mouth at bedtime. 11/28/12  Yes Newt Lukes, MD  isosorbide mononitrate (IMDUR) 30 MG 24 hr tablet Take 0.5 tablets (15 mg total) by mouth daily. 11/06/12  Yes Newt Lukes, MD  leflunomide (ARAVA) 20 MG tablet Take 20 mg by mouth daily.   Yes Historical Provider, MD  levothyroxine (SYNTHROID, LEVOTHROID) 25 MCG tablet TAKE 1 TABLET DAILY 07/27/12  Yes Newt Lukes, MD  lisinopril (PRINIVIL,ZESTRIL) 5 MG tablet Take  5 mg by mouth daily.   Yes Historical Provider, MD  metoprolol succinate (TOPROL-XL) 100 MG 24 hr tablet Take 50 mg by mouth 2 (two) times daily. Take with or immediately following a meal.   Yes Historical Provider, MD  pantoprazole (PROTONIX) 40 MG tablet Take 40 mg by mouth daily.   Yes Historical Provider, MD  potassium chloride (K-DUR) 10 MEQ tablet Take 2 tablets (20 mEq total) by mouth daily. 12/03/12  Yes Newt Lukes, MD  predniSONE (DELTASONE) 5 MG tablet Take 5 mg by mouth daily. 04/06/12  Yes Newt Lukes, MD  sucralfate (CARAFATE) 1 GM/10ML suspension Take 1 g by mouth 4 (four) times daily -  with meals and at bedtime.   Yes Historical Provider, MD  diphenoxylate-atropine (LOMOTIL) 2.5-0.025 MG per tablet Take 1 tablet by mouth 4 (four) times daily as needed for diarrhea or loose stools. 07/04/12   Newt Lukes, MD  HYDROcodone-acetaminophen (NORCO/VICODIN) 5-325 MG per tablet Take 1 tablet by mouth every 6 (six) hours as needed. 09/21/12   [name], MD  loperamide (IMODIUM) 2 MG capsule Take 1 capsule (2 mg total) by mouth as needed for diarrhea or loose stools. 09/21/12   [name], MD   nitroGLYCERIN (NITROSTAT) 0.4 MG SL tablet Place 0.4 mg under the tongue every 5 (five) minutes x 3 doses as needed for chest pain. 03/20/12   Roger A Arguello, PA-C  ondansetron (ZOFRAN) 4 MG tablet Take 1 tablet (4 mg total) by mouth 3 (three) times daily before meals. For nausea 10/15/12   Sammuel Cooper, PA-C    Physical Exam: Filed Vitals:   12/13/12 0900 12/13/12 0928 12/13/12 1117 12/13/12 1206  BP: 114/63 138/76 100/40   Pulse:  88 79   Temp:  102.9 F (39.4 C) 99.1 F (37.3 C)   TempSrc:  Rectal Oral   Resp: 28 18 18    Height:    5' 4.17" (1.63 m)  Weight:    67.314 kg (148 lb 6.4 oz)  SpO2:  90% 93%      Physical Exam: Blood pressure 100/40, pulse 79, temperature 99.1 F (37.3 C), temperature source Oral, resp. rate 18, height 5' 4.17" (1.63 m), weight 67.314 kg (148 lb 6.4 oz), SpO2 93.00%. Gen: No acute distress. Somewhat lethargic, falls asleep during interview. Head: Normocephalic, atraumatic. Eyes: PERRL, EOMI, sclerae nonicteric. Mouth: Oropharynx clear. Neck: Supple, no thyromegaly, no lymphadenopathy, no jugular venous distention. Chest: Lungs clear but diminished in the bases. CV: Heart sounds are irregularly irregular. No murmurs. Abdomen: Soft, nontender, nondistended with normal active bowel sounds. Extremities: Extremities are with 3+ pitting edema bilaterally, erythema to the left anterior leg, tender to touch. Skin: Warm and dry. Neuro: Alert and oriented times 2; cranial nerves II through XII grossly intact. Psych: Mood and affect normal.  Labs on Admission:  Basic Metabolic Panel:  Recent Labs Lab 12/13/12 0610  NA 140  K 3.4*  CL 104  GLUCOSE 101*  BUN 23  CREATININE 1.20*   CBC:  Recent Labs Lab 12/13/12 0600 12/13/12 0610  WBC 11.0*  --   NEUTROABS 9.7*  --   HGB 10.2* 11.2*  HCT 31.4* 33.0*  MCV 107.2*  --   PLT 168  --     BNP (last 3 results)  Recent Labs  03/19/12 1009  PROBNP 4164.0*   CBG:  Recent Labs Lab  12/13/12 0651  GLUCAP 70   Urinalysis    Component Value Date/Time   COLORURINE YELLOW  12/13/2012 0717   APPEARANCEUR CLEAR 12/13/2012 0717   LABSPEC 1.015 12/13/2012 0717   PHURINE 5.0 12/13/2012 0717   GLUCOSEU NEGATIVE 12/13/2012 0717   HGBUR NEGATIVE 12/13/2012 0717   HGBUR large 01/26/2010 1104   BILIRUBINUR NEGATIVE 12/13/2012 0717   KETONESUR NEGATIVE 12/13/2012 0717   PROTEINUR NEGATIVE 12/13/2012 0717   UROBILINOGEN 0.2 12/13/2012 0717   NITRITE NEGATIVE 12/13/2012 0717   LEUKOCYTESUR NEGATIVE 12/13/2012 0717      Radiological Exams on Admission: Dg Chest 2 View  12/13/2012   *RADIOLOGY REPORT*  Clinical Data: Fever  CHEST - 2 VIEW  Comparison:  Study obtained earlier in the day and March 19, 2012  Findings:  There is a degree of underlying emphysematous change. There is mild scarring bilaterally.  There is no edema or consolidation. Heart is mildly enlarged with normal pulmonary vascularity.  Pacemaker leads are attached to the right atrium and right ventricle.  There is degenerative change in the thoracic spine.  No adenopathy.  IMPRESSION: Underlying emphysematous change.  No edema or consolidation.   Original Report Authenticated By: Bretta Bang, M.D.   Dg Chest Portable 1 View  12/13/2012   *RADIOLOGY REPORT*  Clinical Data: Bodyaches.  Shortness of breath.  Weakness.  PORTABLE CHEST - 1 VIEW  Comparison: Chest x-ray 03/19/2012.  Findings: Mild diffuse peribronchial cuffing.  No acute consolidative airspace disease.  No pleural effusions.  Lung volumes are normal.  No evidence of pulmonary edema.  Heart size cm borderline enlarged. The patient is rotated to the left on today's exam, resulting in distortion of the mediastinal contours and reduced diagnostic sensitivity and specificity for mediastinal pathology.  Atherosclerosis in the thoracic aorta.  Left-sided pacemaker device in place with lead tips projecting over the expected location of the right atrium and right  ventricular apex.  IMPRESSION: 1.  Mild diffuse peribronchial cuffing may suggest bronchitis. 2.  Borderline cardiomegaly. 3.  Atherosclerosis.   Original Report Authenticated By: Trudie Reed, M.D.    EKG: Independently reviewed. Atrial fibrillation with a ventricular rate of 88 beats per minute. Ventricular bigeminy and abarrent  complexes noted.   Assessment/Plan Principal Problem:   Left lower extremity cellulitis in the setting of chronic venous stasis/edema -Given fever and no other obvious source for this (urinalysis clear, chest x-ray negative for pneumonia), would treat for recurrent cellulitis. -Empiric vancomycin and Zosyn have been ordered. -Will check blood cultures x2 prior to the initiation of antibiotics. -Will have ortho tech apply Unna boots. Active Problems:   HYPOTHYROIDISM -Continue home dose of Synthroid.   HYPERLIPIDEMIA -Continue home dose of statin therapy.   History of CHF -Last two-dimensional echocardiogram done 04/03/2012. EF 55-65%. No regional wall motion abnormalities. No mention of diastolic dysfunction. -Check pro BNP. Continue Lasix therapy.   Macrocytic anemia -Continue Folvite.   Atrial fibrillation -Rate controlled, on chronic anticoagulation with Pradaxa.   Stage III CKD (chronic kidney disease) -Baseline creatinine variable but GFR ranges from 31-44. Current creatinine close to baseline values.   Depression -Continue Celexa.   Hypokalemia -Increase KCL supplementation to BID.   Rheumatoid arthritis -Hold Arava in the setting of acute infection. -Continue prednisone for now.   Code Status: Full. Family Communication: No family at bedside. Disposition Plan: Back to ALF when stable.  Time spent: 70 minutes.  Milica Gully [facility] Pager [phone]  If 7PM-7AM, please contact night-coverage [email or web address] Password [facility] 12/13/2012, 12:20 PM

## 2012-12-13 NOTE — Progress Notes (Signed)
Report given to Yong Channel RN- Hulda Marin RN

## 2012-12-13 NOTE — Progress Notes (Signed)
Patient;s son Amoni Scallan was made aware about the patient's condition  And also mentioned to him that we will transfer patient to ICU- Hulda Marin RN

## 2012-12-13 NOTE — Progress Notes (Signed)
Patient's BP went down to 72/42, I notified DR. Rama,she ordered 500cc bolus x1.- Hulda Marin RN

## 2012-12-13 NOTE — Progress Notes (Signed)
ANTIBIOTIC CONSULT NOTE - INITIAL  Pharmacy Consult for Vancomycin & Zosyn Indication: rule out pneumonia  Allergies  Allergen Reactions  . Sulfa Antibiotics Itching  . Tramadol Hcl Itching and Rash   Patient Measurements: Height: 5' 4.17" (163 cm) (taken 10/15/12) Weight: 148 lb 6.4 oz (67.314 kg) (taken 11/28/12) IBW/kg (Calculated) : 55.1  Vital Signs: Temp: 99.1 F (37.3 C) (08/14 1117) Temp src: Oral (08/14 1117) BP: 100/40 mmHg (08/14 1117) Pulse Rate: 79 (08/14 1117) Intake/Output from previous day:   Intake/Output from this shift:    Labs:  Recent Labs  12/13/12 0600 12/13/12 0610  WBC 11.0*  --   HGB 10.2* 11.2*  PLT 168  --   CREATININE  --  1.20*   Estimated Creatinine Clearance: 30.7 ml/min (by C-G formula based on Cr of 1.2). No results found for this basename: VANCOTROUGH, VANCOPEAK, VANCORANDOM, GENTTROUGH, GENTPEAK, GENTRANDOM, TOBRATROUGH, TOBRAPEAK, TOBRARND, AMIKACINPEAK, AMIKACINTROU, AMIKACIN,  in the last 72 hours   Microbiology: No results found for this or any previous visit (from the past 720 hour(s)).  Medical History: Past Medical History  Diagnosis Date  . HEARING LOSS   . MITRAL VALVE INSUFF&AORTIC VALVE INSUFF     moderate MR  . PULMONARY HYPERTENSION   . Atrial fibrillation     permanent  . SICK SINUS SYNDROME     a. Tachybrady syndrome - Guidant PPM 10/2005.  . Edema 03/19/2009    due to venous insufficiency, chronic lymphedema  . URINARY INCONTINENCE   . Rheumatoid arthritis(714.0) dx clarified 2011    On prednisone  . BREAST CANCER 12/2006    ductal ca s/p L lumpectomy  . Diastolic dysfunction   . HYPERTENSION   . HYPERLIPIDEMIA   . GERD   . HYPOTHYROIDISM   . Hyperparathyroidism     2 lobes removed  . Venous insufficiency     chronic BLE edema  . Spinal stenosis   . Anemia     a. Macrocytic - normal B12, folate 08/2011. b. 3/6 heme positive stools 10/2011 - per PCP note, elected against colonscopy.  [name] TIA (transient  ischemic attack) 05/2011  . Anxiety    Medications:  Scheduled:  . atorvastatin  10 mg Oral QHS  . calcium-vitamin D  1 tablet Oral BID WC  . citalopram  40 mg Oral Daily  . cyanocobalamin  500 mcg Oral Daily  . dabigatran  75 mg Oral Q12H  . folic acid  0.5 mg Oral Daily  . furosemide  20 mg Oral BID  . gabapentin  100 mg Oral BID WC  . gabapentin  400 mg Oral QHS  . isosorbide mononitrate  15 mg Oral Daily  . [START ON 12/14/2012] levothyroxine  25 mcg Oral Q0600  . lisinopril  5 mg Oral Daily  . metoprolol succinate  50 mg Oral BID  . pantoprazole  40 mg Oral Daily  . piperacillin-tazobactam (ZOSYN)  IV  3.375 g Intravenous Q8H  . potassium chloride  20 mEq Oral BID  . predniSONE  5 mg Oral Q breakfast  . sodium chloride  3 mL Intravenous Q12H  . sucralfate  1 g Oral TID WC & HS  . [START ON 12/14/2012] vancomycin  1,000 mg Intravenous Q24H   Anti-infectives   Start     Dose/Rate Route Frequency Ordered Stop   12/14/12 1000  vancomycin (VANCOCIN) IVPB 1000 mg/200 mL premix     1,000 mg 200 mL/hr over 60 Minutes Intravenous Every 24 hours 12/13/12 1232  12/13/12 1700  piperacillin-tazobactam (ZOSYN) IVPB 3.375 g     3.375 g 12.5 mL/hr over 240 Minutes Intravenous Every 8 hours 12/13/12 1232     12/13/12 0630  vancomycin (VANCOCIN) IVPB 1000 mg/200 mL premix     1,000 mg 200 mL/hr over 60 Minutes Intravenous  Once 12/13/12 0627 12/13/12 1104   12/13/12 0630  piperacillin-tazobactam (ZOSYN) IVPB 3.375 g     3.375 g 100 mL/hr over 30 Minutes Intravenous  Once 12/13/12 0627 12/13/12 1005     Assessment: 89 yoF with chills, fever, mild leukocytosis. CXray with probable bronchitis, no effusion noted.  Vancomycin, Zosyn per pharmacy  Renal dysfunction, Clearance ~ 30 ml/min  Goal of Therapy:  Vancomycin trough level 15-20 mcg/ml  Plan:   Vancomycin 1gm q24 hr  Zosyn 3.375gm q8h-4hr infusion  Monitor renal function closely, dose adjustment and trough if  necessary  Otho Bellows PharmD Pager [phone] 12/13/2012, 12:38 PM

## 2012-12-14 DIAGNOSIS — A419 Sepsis, unspecified organism: Secondary | ICD-10-CM | POA: Diagnosis present

## 2012-12-14 DIAGNOSIS — L0291 Cutaneous abscess, unspecified: Secondary | ICD-10-CM

## 2012-12-14 DIAGNOSIS — R6521 Severe sepsis with septic shock: Secondary | ICD-10-CM | POA: Diagnosis present

## 2012-12-14 LAB — CBC
Platelets: 152 10*3/uL (ref 150–400)
RBC: 2.28 MIL/uL — ABNORMAL LOW (ref 3.87–5.11)
RDW: 18.4 % — ABNORMAL HIGH (ref 11.5–15.5)
WBC: 18.6 10*3/uL — ABNORMAL HIGH (ref 4.0–10.5)

## 2012-12-14 LAB — IRON AND TIBC
Iron: 13 ug/dL — ABNORMAL LOW (ref 42–135)
Saturation Ratios: 8 % — ABNORMAL LOW (ref 20–55)
TIBC: 161 ug/dL — ABNORMAL LOW (ref 250–470)

## 2012-12-14 LAB — BASIC METABOLIC PANEL
Calcium: 7.6 mg/dL — ABNORMAL LOW (ref 8.4–10.5)
GFR calc non Af Amer: 32 mL/min — ABNORMAL LOW (ref >90)
Sodium: 137 mEq/L (ref 135–145)

## 2012-12-14 LAB — OCCULT BLOOD X 1 CARD TO LAB, STOOL: Fecal Occult Bld: NEGATIVE

## 2012-12-14 MED ORDER — SODIUM CHLORIDE 0.9 % IV BOLUS (SEPSIS)
500.0000 mL | Freq: Once | INTRAVENOUS | Status: AC
  Administered 2012-12-14: 500 mL via INTRAVENOUS

## 2012-12-14 MED ORDER — CITALOPRAM HYDROBROMIDE 20 MG PO TABS
20.0000 mg | ORAL_TABLET | Freq: Every day | ORAL | Status: DC
  Administered 2012-12-14 – 2012-12-24 (×11): 20 mg via ORAL
  Filled 2012-12-14 (×11): qty 1

## 2012-12-14 MED ORDER — MORPHINE SULFATE 4 MG/ML IJ SOLN
4.0000 mg | INTRAMUSCULAR | Status: DC | PRN
  Administered 2012-12-14 – 2012-12-21 (×4): 4 mg via INTRAVENOUS
  Filled 2012-12-14 (×5): qty 1

## 2012-12-14 MED ORDER — VANCOMYCIN HCL IN DEXTROSE 750-5 MG/150ML-% IV SOLN
750.0000 mg | INTRAVENOUS | Status: DC
  Administered 2012-12-15 – 2012-12-16 (×2): 750 mg via INTRAVENOUS
  Filled 2012-12-14 (×3): qty 150

## 2012-12-14 MED ORDER — POTASSIUM CHLORIDE CRYS ER 20 MEQ PO TBCR
20.0000 meq | EXTENDED_RELEASE_TABLET | Freq: Every day | ORAL | Status: DC
  Administered 2012-12-14: 20 meq via ORAL
  Filled 2012-12-14 (×2): qty 1

## 2012-12-14 NOTE — Progress Notes (Signed)
ANTIBIOTIC CONSULT NOTE - follow-up  Pharmacy Consult for Vancomycin & Zosyn Indication: rule out pneumonia  Allergies  Allergen Reactions  . Sulfa Antibiotics Itching  . Tramadol Hcl Itching and Rash   Patient Measurements: Height: 5' 4.17" (163 cm) (taken 10/15/12) Weight: 148 lb 6.4 oz (67.314 kg) (taken 11/28/12) IBW/kg (Calculated) : 55.1  Vital Signs: Temp: 98.2 F (36.8 C) (08/15 0900) Temp src: Core (Comment) (08/15 0600) BP: 98/51 mmHg (08/15 0835) Pulse Rate: 58 (08/15 0900) Intake/Output from previous day: 08/14 0701 - 08/15 0700 In: 1885 [I.V.:1785; IV Piggyback:100] Out: -  Intake/Output from this shift: Total I/O In: 413.5 [I.V.:363.5; IV Piggyback:50] Out: 50 [Urine:50]  Labs:  Recent Labs  12/13/12 0600 12/13/12 0610 12/13/12 2233 12/14/12 0540  WBC 11.0*  --  17.4* 18.6*  HGB 10.2* 11.2* 8.0* 8.2*  PLT 168  --  150 152  CREATININE  --  1.20* 1.33* 1.40*   Estimated Creatinine Clearance: 26.3 ml/min (by C-G formula based on Cr of 1.4). No results found for this basename: VANCOTROUGH, Leodis Binet, VANCORANDOM, GENTTROUGH, GENTPEAK, GENTRANDOM, TOBRATROUGH, TOBRAPEAK, TOBRARND, AMIKACINPEAK, AMIKACINTROU, AMIKACIN,  in the last 72 hours   Microbiology: Recent Results (from the past 720 hour(s))  CULTURE, BLOOD (ROUTINE X 2)     Status: None   Collection Time    12/13/12  8:35 AM      Result Value Range Status   Specimen Description BLOOD RIGHT ANTECUBITAL   Final   Special Requests BOTTLES DRAWN AEROBIC ONLY 4CC   Final   Culture  Setup Time     Final   Value: 12/13/2012 10:22     Performed at [facility]   Culture     Final   Value:        BLOOD CULTURE RECEIVED NO GROWTH TO DATE CULTURE WILL BE HELD FOR 5 DAYS BEFORE ISSUING A FINAL NEGATIVE REPORT     Performed at [facility]   Report Status PENDING   Incomplete  CULTURE, BLOOD (ROUTINE X 2)     Status: None   Collection Time    12/13/12  9:00 AM      Result Value Range  Status   Specimen Description BLOOD RIGHT ANTECUBITAL   Final   Special Requests BOTTLES DRAWN AEROBIC AND ANAEROBIC 5CC   Final   Culture  Setup Time     Final   Value: 12/13/2012 10:22     Performed at [facility]   Culture     Final   Value:        BLOOD CULTURE RECEIVED NO GROWTH TO DATE CULTURE WILL BE HELD FOR 5 DAYS BEFORE ISSUING A FINAL NEGATIVE REPORT     Performed at [facility]   Report Status PENDING   Incomplete  MRSA PCR SCREENING     Status: None   Collection Time    12/13/12 11:09 AM      Result Value Range Status   MRSA by PCR NEGATIVE  NEGATIVE Final   Comment:            The GeneXpert MRSA Assay (FDA     approved for NASAL specimens     only), is one component of a     comprehensive MRSA colonization     surveillance program. It is not     intended to diagnose MRSA     infection nor to guide or     monitor treatment for     MRSA infections.  MRSA  PCR SCREENING     Status: None   Collection Time    12/13/12  7:59 PM      Result Value Range Status   MRSA by PCR NEGATIVE  NEGATIVE Final   Comment:            The GeneXpert MRSA Assay (FDA     approved for NASAL specimens     only), is one component of a     comprehensive MRSA colonization     surveillance program. It is not     intended to diagnose MRSA     infection nor to guide or     monitor treatment for     MRSA infections.    Medical History: Past Medical History  Diagnosis Date  . HEARING LOSS   . MITRAL VALVE INSUFF&AORTIC VALVE INSUFF     moderate MR  . PULMONARY HYPERTENSION   . Atrial fibrillation     permanent  . SICK SINUS SYNDROME     a. Tachybrady syndrome - Guidant PPM 10/2005.  . Edema 03/19/2009    due to venous insufficiency, chronic lymphedema  . URINARY INCONTINENCE   . Rheumatoid arthritis(714.0) dx clarified 2011    On prednisone  . BREAST CANCER 12/2006    ductal ca s/p L lumpectomy  . Diastolic dysfunction   . HYPERTENSION   . HYPERLIPIDEMIA   .  GERD   . HYPOTHYROIDISM   . Hyperparathyroidism     2 lobes removed  . Venous insufficiency     chronic BLE edema  . Spinal stenosis   . Anemia     a. Macrocytic - normal B12, folate 08/2011. b. 3/6 heme positive stools 10/2011 - per PCP note, elected against colonscopy.  [name] TIA (transient ischemic attack) 05/2011  . Anxiety    Medications:  Scheduled:  . atorvastatin  10 mg Oral QHS  . calcium-vitamin D  1 tablet Oral BID WC  . citalopram  20 mg Oral Daily  . cyanocobalamin  500 mcg Oral Daily  . dabigatran  75 mg Oral Q12H  . folic acid  0.5 mg Oral Daily  . gabapentin  100 mg Oral BID WC  . gabapentin  400 mg Oral QHS  . hydrocortisone sod succinate (SOLU-CORTEF) inj  50 mg Intravenous Q6H  . isosorbide mononitrate  15 mg Oral Daily  . levothyroxine  25 mcg Oral Q0600  . pantoprazole  40 mg Oral Daily  . piperacillin-tazobactam (ZOSYN)  IV  3.375 g Intravenous Q8H  . potassium chloride SA  20 mEq Oral Daily  . sodium chloride  500 mL Intravenous Once  . sodium chloride  3 mL Intravenous Q12H  . sucralfate  1 g Oral TID WC & HS  . vancomycin  1,000 mg Intravenous Q24H   Anti-infectives   Start     Dose/Rate Route Frequency Ordered Stop   12/14/12 1000  vancomycin (VANCOCIN) IVPB 1000 mg/200 mL premix     1,000 mg 200 mL/hr over 60 Minutes Intravenous Every 24 hours 12/13/12 1232     12/13/12 1700  piperacillin-tazobactam (ZOSYN) IVPB 3.375 g     3.375 g 12.5 mL/hr over 240 Minutes Intravenous Every 8 hours 12/13/12 1232     12/13/12 0630  vancomycin (VANCOCIN) IVPB 1000 mg/200 mL premix     1,000 mg 200 mL/hr over 60 Minutes Intravenous  Once 12/13/12 0627 12/13/12 1104   12/13/12 0630  piperacillin-tazobactam (ZOSYN) IVPB 3.375 g     3.375 g 100 mL/hr over  30 Minutes Intravenous  Once 12/13/12 0627 12/13/12 1005     Assessment: 89 yoF with chills, fever, mild leukocytosis. Suspected cellulitis on vancomycin/zosyn. Patient with h/o CRI.  8/14 >> Vanc >> 8/14 >>  Zosyn >>   Tmax: afeb WBCs: 18.6 Renal: SCr 1.4, CrCl ~ 17ml/min (C-G) and 50ml/min (N)  8/14 blood: NGTD MRSA PCR neg  Remains on phenylephrine gtt  Goal of Therapy:  Vancomycin trough level 15-20 mcg/ml  Plan:  Day #2 vancomycin/zosyn  Change vancomycin to 750mg  IV q24h after today's dose  Zosyn 3.375gm q8h-4hr infusion  Monitor renal function closely, dose adjustment and trough if necessary. Low threshold to adjust to q48h if SCr worsens  Juliette Alcide, PharmD, BCPS.   Pager: [phone] 12/14/2012, 9:48 AM

## 2012-12-14 NOTE — Consult Note (Signed)
PULMONARY  / CRITICAL CARE MEDICINE  Name: Nicole Roberts MRN: [id number] DOB: 1922-02-27    ADMISSION DATE:  12/13/2012 CONSULTATION DATE:  12/14/2012   REFERRING MD :  Rozanna Box PRIMARY SERVICE: [facility]  CHIEF COMPLAINT:  Hypotension, not responding to lfuids  BRIEF PATIENT DESCRIPTION: 79 y.o woman, NHR, adm 8/14 with recurrent LE erythema, swelling & tenderness , concerning for cellulitis with hypotension requiring neosynephrine gtt, lactate 3.7 s/o severe sepsis PMH of atrial fibrillation, sinus syndrome status post permanent pacemaker in 2007, breast cancer status post lumpectomy, hypertension, hyperlipidemia, hypothyroidism  she was hospitalized 09/16/2012-09/21/2012 for cellulitis. She was back in the ER ER on 11/24/2012 with recurrent cellulitis -given  IV f Cipro and  oral doxycycline     SIGNIFICANT EVENTS / STUDIES:  8/14 neo gtt  LINES / TUBES:   CULTURES: Blood 8/14 >>  ANTIBIOTICS: 8/14 vanc >> 8/14 zosyn >>   PAST MEDICAL HISTORY :  Past Medical History  Diagnosis Date  . HEARING LOSS   . MITRAL VALVE INSUFF&AORTIC VALVE INSUFF     moderate MR  . PULMONARY HYPERTENSION   . Atrial fibrillation     permanent  . SICK SINUS SYNDROME     a. Tachybrady syndrome - Guidant PPM 10/2005.  . Edema 03/19/2009    due to venous insufficiency, chronic lymphedema  . URINARY INCONTINENCE   . Rheumatoid arthritis(714.0) dx clarified 2011    On prednisone  . BREAST CANCER 12/2006    ductal ca s/p L lumpectomy  . Diastolic dysfunction   . HYPERTENSION   . HYPERLIPIDEMIA   . GERD   . HYPOTHYROIDISM   . Hyperparathyroidism     2 lobes removed  . Venous insufficiency     chronic BLE edema  . Spinal stenosis   . Anemia     a. Macrocytic - normal B12, folate 08/2011. b. 3/6 heme positive stools 10/2011 - per PCP note, elected against colonscopy.  [name] TIA (transient ischemic attack) 05/2011  . Anxiety    Past Surgical History  Procedure Laterality Date  . Pacemaker  placement  2005    SSS and syncope  . Cholecystectomy  04/06/99  . Abdominal hysterectomy  1970    Partial  . Left hip arthroscopy  1995  . Right hip arthroscopy  01/2001  . Carpal tunnel release  1998    bilateral  . Breast surgery  12/2006    Left breast lupectomy- Ductal CA  . Left parathyroidectomy  07/2008  . Esophagogastroduodenoscopy N/A 10/18/2012    Procedure: ESOPHAGOGASTRODUODENOSCOPY (EGD);  Surgeon: Meryl Dare, MD;  Location: [facility];  Service: Endoscopy;  Laterality: N/A;   Prior to Admission medications   Medication Sig Start Date End Date Taking? Authorizing Provider  acetaminophen (TYLENOL) 500 MG tablet Take 500 mg by mouth every 6 (six) hours as needed for pain. For pain   Yes Historical Provider, MD  atorvastatin (LIPITOR) 10 MG tablet Take 10 mg by mouth at bedtime.   Yes Historical Provider, MD  calcium citrate-vitamin D (CITRACAL+D) 315-200 MG-UNIT per tablet Take 1 tablet by mouth 2 (two) times daily.   Yes Historical Provider, MD  citalopram (CELEXA) 40 MG tablet Take 1 tablet (40 mg total) by mouth daily. 10/29/12  Yes Newt Lukes, MD  cyanocobalamin 500 MCG tablet Take 500 mcg by mouth daily.   Yes Historical Provider, MD  dabigatran (PRADAXA) 75 MG CAPS Take 1 capsule (75 mg total) by mouth every 12 (twelve) hours. 10/29/12  Yes Raenette Rover  Felicity Coyer, MD  folic acid (FOLVITE) 400 MCG tablet Take 400 mcg by mouth daily.   Yes Historical Provider, MD  furosemide (LASIX) 20 MG tablet Take 1 tablet (20 mg total) by mouth 2 (two) times daily. 09/21/12  Yes [name], MD  gabapentin (NEURONTIN) 100 MG capsule Take 1 capsule (100 mg total) by mouth 2 (two) times daily. 11/28/12  Yes Newt Lukes, MD  gabapentin (NEURONTIN) 400 MG capsule Take 1 capsule (400 mg total) by mouth at bedtime. 11/28/12  Yes Newt Lukes, MD  isosorbide mononitrate (IMDUR) 30 MG 24 hr tablet Take 0.5 tablets (15 mg total) by mouth daily. 11/06/12  Yes Newt Lukes,  MD  leflunomide (ARAVA) 20 MG tablet Take 20 mg by mouth daily.   Yes Historical Provider, MD  levothyroxine (SYNTHROID, LEVOTHROID) 25 MCG tablet TAKE 1 TABLET DAILY 07/27/12  Yes Newt Lukes, MD  lisinopril (PRINIVIL,ZESTRIL) 5 MG tablet Take 5 mg by mouth daily.   Yes Historical Provider, MD  metoprolol succinate (TOPROL-XL) 100 MG 24 hr tablet Take 50 mg by mouth 2 (two) times daily. Take with or immediately following a meal.   Yes Historical Provider, MD  pantoprazole (PROTONIX) 40 MG tablet Take 40 mg by mouth daily.   Yes Historical Provider, MD  potassium chloride (K-DUR) 10 MEQ tablet Take 2 tablets (20 mEq total) by mouth daily. 12/03/12  Yes Newt Lukes, MD  predniSONE (DELTASONE) 5 MG tablet Take 5 mg by mouth daily. 04/06/12  Yes Newt Lukes, MD  sucralfate (CARAFATE) 1 GM/10ML suspension Take 1 g by mouth 4 (four) times daily -  with meals and at bedtime.   Yes Historical Provider, MD  diphenoxylate-atropine (LOMOTIL) 2.5-0.025 MG per tablet Take 1 tablet by mouth 4 (four) times daily as needed for diarrhea or loose stools. 07/04/12   Newt Lukes, MD  HYDROcodone-acetaminophen (NORCO/VICODIN) 5-325 MG per tablet Take 1 tablet by mouth every 6 (six) hours as needed. 09/21/12   [name], MD  loperamide (IMODIUM) 2 MG capsule Take 1 capsule (2 mg total) by mouth as needed for diarrhea or loose stools. 09/21/12   [name], MD  nitroGLYCERIN (NITROSTAT) 0.4 MG SL tablet Place 0.4 mg under the tongue every 5 (five) minutes x 3 doses as needed for chest pain. 03/20/12   Roger A Arguello, PA-C  ondansetron (ZOFRAN) 4 MG tablet Take 1 tablet (4 mg total) by mouth 3 (three) times daily before meals. For nausea 10/15/12   Amy S Esterwood, PA-C   Allergies  Allergen Reactions  . Sulfa Antibiotics Itching  . Tramadol Hcl Itching and Rash    FAMILY HISTORY:  Family History  Problem Relation Age of Onset  . Ovarian cancer Mother   . Coronary artery disease  Sister   . Coronary artery disease Brother   . Hypertension Mother     grandparent  . Lung cancer Father    SOCIAL HISTORY:  reports that she has never smoked. She has never used smokeless tobacco. She reports that she does not drink alcohol or use illicit drugs.  REVIEW OF SYSTEMS:  Constitutional: negative for anorexia, fevers and sweats  Eyes: negative for irritation, redness and visual disturbance  Ears, nose, mouth, throat, and face: negative for earaches, epistaxis, nasal congestion and sore throat  Respiratory: negative for cough, dyspnea on exertion, sputum and wheezing  Cardiovascular: negative for chest pain, dyspnea, lower extremity edema, orthopnea, palpitations and syncope  Gastrointestinal: negative for abdominal pain, constipation,  diarrhea, melena, nausea and vomiting  Genitourinary:negative for dysuria, frequency and hematuria  Hematologic/lymphatic: negative for bleeding, easy bruising and lymphadenopathy  Musculoskeletal:negative for arthralgias, muscle weakness and stiff joints, c/o LE pain -improved  Neurological: negative for coordination problems, gait problems, headaches and weakness  Endocrine: negative for diabetic symptoms including polydipsia, polyuria and weight loss   SUBJECTIVE: no dizzinesss, chest pain, dyspnea Neo gtt at low dose  VITAL SIGNS: Temp:  [97.9 F (36.6 C)-99.1 F (37.3 C)] 98.2 F (36.8 C) (08/15 0900) Pulse Rate:  [58-79] 58 (08/15 0900) Resp:  [12-22] 12 (08/15 0900) BP: (47-170)/(24-105) 98/51 mmHg (08/15 0835) SpO2:  [91 %-100 %] 100 % (08/15 0900) Arterial Line BP: (87-125)/(38-62) 97/43 mmHg (08/15 0900) Weight:  [67.314 kg (148 lb 6.4 oz)] 67.314 kg (148 lb 6.4 oz) (08/14 1206) HEMODYNAMICS:   VENTILATOR SETTINGS:   INTAKE / OUTPUT: Intake/Output     08/14 0701 - 08/15 0700 08/15 0701 - 08/16 0700   I.V. (mL/kg) 1785 (26.5) 363.5 (5.4)   IV Piggyback 100 50   Total Intake(mL/kg) 1885 (28) 413.5 (6.1)   Urine  (mL/kg/hr)  50 (0.2)   Total Output   50   Net +1885 +363.5          PHYSICAL EXAMINATION: Gen. Pleasant, well-nourished,elderly,  in no distress, normal affect ENT - no lesions, no post nasal drip Neck: No JVD, no thyromegaly, no carotid bruits Lungs: no use of accessory muscles, no dullness to percussion, clear without rales or rhonchi  Cardiovascular: Rhythm regular, heart sounds  normal, no murmurs, no peripheral edema Abdomen: soft and non-tender, no hepatosplenomegaly, BS normal. Musculoskeletal: No deformities, no cyanosis or clubbing Neuro:  alert, non focal Skin:  Warm, BLEs wrapped up   LABS:  CBC Recent Labs     12/13/12  0600  12/13/12  0610  12/13/12  2233  12/14/12  0540  WBC  11.0*   --   17.4*  18.6*  HGB  10.2*  11.2*  8.0*  8.2*  HCT  31.4*  33.0*  23.8*  24.2*  PLT  168   --   150  152   Coag's No results found for this basename: APTT, INR,  in the last 72 hours BMET Recent Labs     12/13/12  0610  12/13/12  2233  12/14/12  0540  NA  140  137  137  K  3.4*  3.7  4.0  CL  104  106  105  CO2   --   22  21  BUN  23  27*  28*  CREATININE  1.20*  1.33*  1.40*  GLUCOSE  101*  103*  123*   Electrolytes Recent Labs     12/13/12  2233  12/14/12  0540  CALCIUM  7.2*  7.6*   Sepsis Markers Recent Labs     12/14/12  0540  PROCALCITON  7.45   ABG Recent Labs     12/13/12  0628  PHART  7.429  PCO2ART  36.5  PO2ART  84.6   Liver Enzymes Recent Labs     12/13/12  0600  AST  20  ALT  18  ALKPHOS  59  BILITOT  0.5  ALBUMIN  3.2*   Cardiac Enzymes Recent Labs     12/13/12  0600  PROBNP  7685.0*   Glucose Recent Labs     12/13/12  0651  GLUCAP  70    Imaging Dg Chest 2 View  12/13/2012   *  RADIOLOGY REPORT*  Clinical Data: Fever  CHEST - 2 VIEW  Comparison:  Study obtained earlier in the day and March 19, 2012  Findings:  There is a degree of underlying emphysematous change. There is mild scarring bilaterally.  There is no  edema or consolidation. Heart is mildly enlarged with normal pulmonary vascularity.  Pacemaker leads are attached to the right atrium and right ventricle.  There is degenerative change in the thoracic spine.  No adenopathy.  IMPRESSION: Underlying emphysematous change.  No edema or consolidation.   Original Report Authenticated By: Bretta Bang, M.D.   Dg Chest Portable 1 View  12/13/2012   *RADIOLOGY REPORT*  Clinical Data: Bodyaches.  Shortness of breath.  Weakness.  PORTABLE CHEST - 1 VIEW  Comparison: Chest x-ray 03/19/2012.  Findings: Mild diffuse peribronchial cuffing.  No acute consolidative airspace disease.  No pleural effusions.  Lung volumes are normal.  No evidence of pulmonary edema.  Heart size cm borderline enlarged. The patient is rotated to the left on today's exam, resulting in distortion of the mediastinal contours and reduced diagnostic sensitivity and specificity for mediastinal pathology.  Atherosclerosis in the thoracic aorta.  Left-sided pacemaker device in place with lead tips projecting over the expected location of the right atrium and right ventricular apex.  IMPRESSION: 1.  Mild diffuse peribronchial cuffing may suggest bronchitis. 2.  Borderline cardiomegaly. 3.  Atherosclerosis.   Original Report Authenticated By: Trudie Reed, M.D.     CXR: no infx/ effusions  ASSESSMENT / PLAN:  PULMONARY A:No issues P:   [place]  CARDIOVASCULAR A: Severe sepsis Nml echo dec 2013 P:  Lactate resolution good prognostic sign SBP 90 acceptable, give 500 NS bolus, clinically dry  RENAL A:  AKI P:   Adjust meds, hydrate  GASTROINTESTINAL A:  No issues, recent EGD neg P:   Advance PO  HEMATOLOGIC A:  Thrombocytopenia P:  Monitor, transfuse only for Hb 7 or lwoer  INFECTIOUS A:  Recurrent cellulitis P:   Await cx, ct empiric zosyn/ vanc  ENDOCRINE A:  No issues   P:   SSI  NEUROLOGIC A:  No issues P:     TODAY'S SUMMARY: Titrate off neo gtt, more fluids  if needed, lactate clearance good sign, watch renal function  PCCM will be available as needed   Pulmonary and Critical Care Medicine [facility] Pager: [phone]  12/14/2012, 10:19 AM

## 2012-12-14 NOTE — Progress Notes (Addendum)
[facility] PROGRESS NOTE  Nicole Roberts WJX:[id number] DOB: [birth year] DOA: 12/13/2012 PCP: [name], MD  Brief narrative: Nicole Roberts is an 79 y.o. female with a PMH of atrial fibrillation, sinus syndrome status post permanent pacemaker in 2007, breast cancer status post lumpectomy, hypertension, hyperlipidemia, hypothyroidism and pulmonary hypertension who was recently treated for cellulitis. A review of her records reveals that she was hospitalized 09/16/2012-09/21/2012 for treatment of cellulitis and was ultimately discharged to a skilled nursing facility. She was also noted to have diarrhea during that hospitalization. Clostridium difficile studies were negative and she completed a course of 5 days of IV Zosyn and vancomycin. Doppler studies were negative for DVT. She was discharged home on an oral course of doxycycline and Cipro. She has also been under the care of gastroenterology for evaluation of weight loss and GI symptoms including intermittent nausea/vomiting/diarrhea. She underwent an EGD on 10/18/2012 which showed esophagitis for which she was treated with Carafate and PPI therapy. She presented to the ER on 11/24/2012 with recurrent cellulitis for which she was given an IV dose of Cipro and sent home on oral doxycycline. She returned to the emergency department on 12/13/2012 with fever and ongoing complaints of left lower extremity erythema and tenderness. Upon admission, the patient was noted to be persistently hypotensive despite IV fluids and was transferred to the ICU and placed on pressor support.  Assessment/Plan: Principal Problem:   Severe sepsis with septic shock secondary to left lower extremity cellulitis in the setting of chronic venous stasis/edema  -Given fever and no other obvious source for this (urinalysis clear, chest x-ray negative for pneumonia), she is currently being treated for recurrent cellulitis with empiric vancomycin and Zosyn.  -Moved to the ICU  on 12/13/2012 secondary to persistent hypotension and elevated serum lactate. Lactic acid now within normal limits. -Currently on pressor support. -Blood cultures x2 pending. Check pro-calcitonin. -Unna boots applied to lower extremities.  -Mentating well and appears stable and nontoxic. Active Problems:  HYPOTHYROIDISM  -Continue home dose of Synthroid.  HYPERLIPIDEMIA  -Continue home dose of statin therapy.  History of CHF  -Last two-dimensional echocardiogram done 04/03/2012. EF 55-65%. No regional wall motion abnormalities. No mention of diastolic dysfunction.  -Ppro BNP markedly elevated at 7685. Diuretics on hold secondary to hypotension. No complaints of dyspnea. Macrocytic anemia  -Continue Folvite. 2-3 g drop in hemoglobin since admission likely reflective of dilutional factors. Atrial fibrillation  -Rate controlled, on chronic anticoagulation with Pradaxa.  Stage III CKD (chronic kidney disease)  -Baseline creatinine variable but GFR ranges from 31-44. Current creatinine slightly above usual baseline values, likely reflective of hypotension. Continue to monitor. Depression  -Continue Celexa. Note: Dosage reduced to 20 mg daily per pharmacy recommendations to avoid prolonged QTC in patients greater than age 74. Hypokalemia  -Potassium corrected with increase KCL supplementation to BID. Will reduce back to daily dosing since she's not currently on Lasix. Rheumatoid arthritis  -Hold Arava in the setting of acute infection.  -On stress dose steroids given chronic treatment with prednisone 5 mg daily.  Code Status: Full.  Family Communication: Son updated by Maren Reamer, NP 12/13/12. Left message for him (Art at [phone]) this morning. Disposition Plan: Back to ALF when stable.    Medical Consultants:  None.  Other Consultants:  None.  Anti-infectives:  Vancomycin 12/13/2012--->  Zosyn 12/13/2012--->  HPI/Subjective: Nicole Roberts feels well, other than some ongoing  leg discomfort and pain. She spiked a fever of 103.9 overnight, but fever curve down since this morning.  Denies chest pain, chest pressure, dyspnea. No nausea or vomiting.  Objective: Filed Vitals:   12/14/12 0300 12/14/12 0400 12/14/12 0500 12/14/12 0600  BP:      Pulse: 60 59 60 60  Temp: 98.4 F (36.9 C) 98.2 F (36.8 C) 98.1 F (36.7 C) 98.2 F (36.8 C)  TempSrc: Core (Comment) Core (Comment) Core (Comment) Core (Comment)  Resp: 16 15 14 12   Height:      Weight:      SpO2: 100% 100% 100% 98%    Intake/Output Summary (Last 24 hours) at 12/14/12 0704 Last data filed at 12/14/12 8295  Gross per 24 hour  Intake   1885 ml  Output      0 ml  Net   1885 ml    Exam: Gen:  NAD Cardiovascular:  RRR, No M/R/G Respiratory:  Lungs CTAB Gastrointestinal:  Abdomen soft, NT/ND, + BS Extremities:  Unna boots in place, + edema  Data Reviewed: Basic Metabolic Panel:  Recent Labs Lab 12/13/12 0610 12/13/12 2233 12/14/12 0540  NA 140 137 137  K 3.4* 3.7 4.0  CL 104 106 105  CO2  --  22 21  GLUCOSE 101* 103* 123*  BUN 23 27* 28*  CREATININE 1.20* 1.33* 1.40*  CALCIUM  --  7.2* 7.6*   GFR Estimated Creatinine Clearance: 26.3 ml/min (by C-G formula based on Cr of 1.4). Liver Function Tests:  Recent Labs Lab 12/13/12 0600  AST 20  ALT 18  ALKPHOS 59  BILITOT 0.5  PROT 6.2  ALBUMIN 3.2*   CBC:  Recent Labs Lab 12/13/12 0600 12/13/12 0610 12/13/12 2233 12/14/12 0540  WBC 11.0*  --  17.4* 18.6*  NEUTROABS 9.7*  --  15.9*  --   HGB 10.2* 11.2* 8.0* 8.2*  HCT 31.4* 33.0* 23.8* 24.2*  MCV 107.2*  --  105.8* 106.1*  PLT 168  --  150 152   BNP (last 3 results)  Recent Labs  03/19/12 1009 12/13/12 0600  PROBNP 4164.0* 7685.0*   CBG:  Recent Labs Lab 12/13/12 0651  GLUCAP 70   Microbiology Recent Results (from the past 240 hour(s))  MRSA PCR SCREENING     Status: None   Collection Time    12/13/12 11:09 AM      Result Value Range Status   MRSA by  PCR NEGATIVE  NEGATIVE Final   Comment:            The GeneXpert MRSA Assay (FDA     approved for NASAL specimens     only), is one component of a     comprehensive MRSA colonization     surveillance program. It is not     intended to diagnose MRSA     infection nor to guide or     monitor treatment for     MRSA infections.  MRSA PCR SCREENING     Status: None   Collection Time    12/13/12  7:59 PM      Result Value Range Status   MRSA by PCR NEGATIVE  NEGATIVE Final   Comment:            The GeneXpert MRSA Assay (FDA     approved for NASAL specimens     only), is one component of a     comprehensive MRSA colonization     surveillance program. It is not     intended to diagnose MRSA     infection nor to guide or  monitor treatment for     MRSA infections.     Procedures and Diagnostic Studies: Dg Chest 2 View  12/13/2012   *RADIOLOGY REPORT*  Clinical Data: Fever  CHEST - 2 VIEW  Comparison:  Study obtained earlier in the day and March 19, 2012  Findings:  There is a degree of underlying emphysematous change. There is mild scarring bilaterally.  There is no edema or consolidation. Heart is mildly enlarged with normal pulmonary vascularity.  Pacemaker leads are attached to the right atrium and right ventricle.  There is degenerative change in the thoracic spine.  No adenopathy.  IMPRESSION: Underlying emphysematous change.  No edema or consolidation.   Original Report Authenticated By: Bretta Bang, M.D.   Dg Chest Portable 1 View  12/13/2012   *RADIOLOGY REPORT*  Clinical Data: Bodyaches.  Shortness of breath.  Weakness.  PORTABLE CHEST - 1 VIEW  Comparison: Chest x-ray 03/19/2012.  Findings: Mild diffuse peribronchial cuffing.  No acute consolidative airspace disease.  No pleural effusions.  Lung volumes are normal.  No evidence of pulmonary edema.  Heart size cm borderline enlarged. The patient is rotated to the left on today's exam, resulting in distortion of the  mediastinal contours and reduced diagnostic sensitivity and specificity for mediastinal pathology.  Atherosclerosis in the thoracic aorta.  Left-sided pacemaker device in place with lead tips projecting over the expected location of the right atrium and right ventricular apex.  IMPRESSION: 1.  Mild diffuse peribronchial cuffing may suggest bronchitis. 2.  Borderline cardiomegaly. 3.  Atherosclerosis.   Original Report Authenticated By: Trudie Reed, M.D.    Scheduled Meds: . atorvastatin  10 mg Oral QHS  . calcium-vitamin D  1 tablet Oral BID WC  . citalopram  40 mg Oral Daily  . cyanocobalamin  500 mcg Oral Daily  . dabigatran  75 mg Oral Q12H  . folic acid  0.5 mg Oral Daily  . gabapentin  100 mg Oral BID WC  . gabapentin  400 mg Oral QHS  . hydrocortisone sod succinate (SOLU-CORTEF) inj  50 mg Intravenous Q6H  . isosorbide mononitrate  15 mg Oral Daily  . levothyroxine  25 mcg Oral Q0600  . pantoprazole  40 mg Oral Daily  . piperacillin-tazobactam (ZOSYN)  IV  3.375 g Intravenous Q8H  . potassium chloride SA  20 mEq Oral BID  . sodium chloride  3 mL Intravenous Q12H  . sucralfate  1 g Oral TID WC & HS  . vancomycin  1,000 mg Intravenous Q24H   Continuous Infusions: . phenylephrine (NEO-SYNEPHRINE) Adult infusion 30 mcg/min (12/14/12 1610)    Time spent: 35 minutes. The patient is critically ill with acute renal failure, hypotension requiring pressor support and close monitoring.   LOS: 1 day   RAMA,CHRISTINA  [facility] Pager [phone].   *Please note that the hospitalists switch teams on Wednesdays. Please call the flow manager at [phone] if you are having difficulty reaching the hospitalist taking care of this patient as she can update you and provide the most up-to-date pager number of provider caring for the patient. If 8PM-8AM, please contact night-coverage at [email or web address], password [facility]  12/14/2012, 7:04 AM

## 2012-12-14 NOTE — Progress Notes (Signed)
Restarted NeoSynephrine .  Systolic BP consistently dropping below 90.  Patient is asymptomatic.

## 2012-12-14 NOTE — Clinical Social Work Note (Signed)
CSW reviewed chart and left vm for son. Pt appears to be from ALF or SNF, not clear as she was discharged to [facility] in May of this year. CSW awaits clarification on where Pt is admitted from and will assist with dispo accordingly. Pt will need PT work up to assist with dispo as well.   Doreen Salvage, LCSW ICU/Stepdown Clinical Social Worker [facility] Cell [phone] Hours 8am-1200pm M-F

## 2012-12-15 DIAGNOSIS — I369 Nonrheumatic tricuspid valve disorder, unspecified: Secondary | ICD-10-CM

## 2012-12-15 LAB — BASIC METABOLIC PANEL
BUN: 32 mg/dL — ABNORMAL HIGH (ref 6–23)
CO2: 21 mEq/L (ref 19–32)
Calcium: 7.5 mg/dL — ABNORMAL LOW (ref 8.4–10.5)
Creatinine, Ser: 1.43 mg/dL — ABNORMAL HIGH (ref 0.50–1.10)
GFR calc non Af Amer: 32 mL/min — ABNORMAL LOW (ref >90)
Glucose, Bld: 100 mg/dL — ABNORMAL HIGH (ref 70–99)
Sodium: 137 mEq/L (ref 135–145)

## 2012-12-15 LAB — CBC
HCT: 21.8 % — ABNORMAL LOW (ref 36.0–46.0)
Hemoglobin: 7.4 g/dL — ABNORMAL LOW (ref 12.0–15.0)
MCH: 35.7 pg — ABNORMAL HIGH (ref 26.0–34.0)
MCHC: 33.9 g/dL (ref 30.0–36.0)
MCV: 105.3 fL — ABNORMAL HIGH (ref 78.0–100.0)
RBC: 2.07 MIL/uL — ABNORMAL LOW (ref 3.87–5.11)

## 2012-12-15 MED ORDER — METOPROLOL TARTRATE 1 MG/ML IV SOLN
INTRAVENOUS | Status: AC
  Filled 2012-12-15: qty 40

## 2012-12-15 MED ORDER — METOPROLOL TARTRATE 1 MG/ML IV SOLN
5.0000 mg | Freq: Once | INTRAVENOUS | Status: AC
  Administered 2012-12-15: 5 mg via INTRAVENOUS

## 2012-12-15 MED ORDER — METOPROLOL SUCCINATE ER 50 MG PO TB24
50.0000 mg | ORAL_TABLET | Freq: Two times a day (BID) | ORAL | Status: DC
  Administered 2012-12-15 – 2012-12-24 (×18): 50 mg via ORAL
  Filled 2012-12-15 (×23): qty 1

## 2012-12-15 MED ORDER — ENSURE COMPLETE PO LIQD
237.0000 mL | Freq: Two times a day (BID) | ORAL | Status: DC
  Administered 2012-12-15 – 2012-12-24 (×14): 237 mL via ORAL

## 2012-12-15 MED ORDER — POTASSIUM CHLORIDE CRYS ER 20 MEQ PO TBCR
20.0000 meq | EXTENDED_RELEASE_TABLET | Freq: Two times a day (BID) | ORAL | Status: DC
  Administered 2012-12-15 – 2012-12-19 (×9): 20 meq via ORAL
  Filled 2012-12-15 (×10): qty 1

## 2012-12-15 MED ORDER — FENTANYL 100 MCG/HR TD PT72
100.0000 ug | MEDICATED_PATCH | TRANSDERMAL | Status: DC
  Administered 2012-12-15 – 2012-12-24 (×4): 100 ug via TRANSDERMAL
  Filled 2012-12-15 (×4): qty 1

## 2012-12-15 NOTE — Progress Notes (Signed)
LM for Pt's family on their home phone.  LM for Pt's daughter-in-law on her cell.  LM for Pt's son on his cell.  Providence Crosby, LCSWA Clinical Social Work [phone]

## 2012-12-15 NOTE — Progress Notes (Signed)
[facility] PROGRESS NOTE  Nicole Roberts ZOX:[id number] DOB: June 09, 1921 DOA: 12/13/2012 PCP: [name], MD  Brief narrative: Nicole Roberts is an 79 y.o. female with a PMH of atrial fibrillation, sinus syndrome status post permanent pacemaker in 2007, breast cancer status post lumpectomy, hypertension, hyperlipidemia, hypothyroidism and pulmonary hypertension who was recently treated for cellulitis. A review of her records reveals that she was hospitalized 09/16/2012-09/21/2012 for treatment of cellulitis and was ultimately discharged to a skilled nursing facility. She was also noted to have diarrhea during that hospitalization. Clostridium difficile studies were negative and she completed a course of 5 days of IV Zosyn and vancomycin. Doppler studies were negative for DVT. She was discharged home on an oral course of doxycycline and Cipro. She has also been under the care of gastroenterology for evaluation of weight loss and GI symptoms including intermittent nausea/vomiting/diarrhea. She underwent an EGD on 10/18/2012 which showed esophagitis for which she was treated with Carafate and PPI therapy. She presented to the ER on 11/24/2012 with recurrent cellulitis for which she was given an IV dose of Cipro and sent home on oral doxycycline. She returned to the emergency department on 12/13/2012 with fever and ongoing complaints of left lower extremity erythema and tenderness. Upon admission, the patient was noted to be persistently hypotensive despite IV fluids and was transferred to the ICU and placed on pressor support.  Assessment/Plan: Principal Problem:   Severe sepsis with septic shock secondary to left lower extremity cellulitis in the setting of chronic venous stasis/edema  -Given fever and no other obvious source for this (urinalysis clear, chest x-ray negative for pneumonia), she is currently being treated for recurrent cellulitis with empiric vancomycin and Zosyn.  -Moved to the ICU  on 12/13/2012 secondary to persistent hypotension and elevated serum lactate. Lactic acid now within normal limits. -Currently on pressor support. -Blood cultures x2 pending.Pro-calcitonin 7.45--->5.64. -Unna boots applied to lower extremities.  -Mentating well and appears stable and nontoxic despite persistent hypotension. Active Problems: Thrombocytopenia -May be secondary to sepsis. Not on Lovenox but on Pradaxa. Watch for signs of bleeding. HYPOTHYROIDISM  -Continue home dose of Synthroid.  HYPERLIPIDEMIA  -Continue home dose of statin therapy.  History of CHF  -Last two-dimensional echocardiogram done 04/03/2012. EF 55-65%. No regional wall motion abnormalities. No mention of diastolic dysfunction.  -Ppro BNP markedly elevated at 7685. Diuretics on hold secondary to hypotension. No complaints of dyspnea. -2-D echocardiogram ordered. Followup results. Macrocytic anemia  -Continue Folvite. 2-3 g drop in hemoglobin since admission likely reflective of dilutional factors, but watch for signs of bleeding given chronic anticoagulation. Atrial fibrillation  -Rate controlled, on chronic anticoagulation with Pradaxa.  Stage III CKD (chronic kidney disease)  -Baseline creatinine variable but GFR ranges from 31-44. Current creatinine slightly above usual baseline values, likely reflective of hypotension. Continue to monitor. Depression  -Continue Celexa. Note: Dosage reduced to 20 mg daily per pharmacy recommendations to avoid prolonged QTC in patients greater than age 74. Hypokalemia  -Potassium corrected with increase KCL supplementation to BID.  Rheumatoid arthritis  -Hold Arava in the setting of acute infection.  -On stress dose steroids given chronic treatment with prednisone 5 mg daily.  Code Status: Full.  Family Communication: Son updated by Maren Reamer, NP 12/13/12. Left message for him (Nicole Roberts at [phone]) 12/14/12. Disposition Plan: Back to ALF when stable.    Medical  Consultants:  None.  Other Consultants:  None.  Anti-infectives:  Vancomycin 12/13/2012--->  Zosyn 12/13/2012--->  HPI/Subjective: Nicole Roberts feels well, other than  some ongoing leg discomfort and pain which responded well to a when necessary dose of morphine last night. No fevers overnight. Denies chest pain, chest pressure, dyspnea. No nausea or vomiting. Appetite is fair.  Objective: Filed Vitals:   12/15/12 0300 12/15/12 0400 12/15/12 0500 12/15/12 0600  BP:      Pulse: 68 74 64 66  Temp: 97.9 F (36.6 C) 97.7 F (36.5 C) 97.9 F (36.6 C) 97.7 F (36.5 C)  TempSrc:      Resp: 15 14 13 15   Height:      Weight:      SpO2: 99% 100% 98% 97%    Intake/Output Summary (Last 24 hours) at 12/15/12 0706 Last data filed at 12/15/12 0600  Gross per 24 hour  Intake 3109.33 ml  Output    150 ml  Net 2959.33 ml    Exam: Gen:  NAD Cardiovascular:  RRR, No M/R/G Respiratory:  Lungs CTAB Gastrointestinal:  Abdomen soft, NT/ND, + BS Extremities:  Unna boots in place, + edema  Data Reviewed: Basic Metabolic Panel:  Recent Labs Lab 12/13/12 0610 12/13/12 2233 12/14/12 0540 12/15/12 0500  NA 140 137 137 137  K 3.4* 3.7 4.0 3.5  CL 104 106 105 105  CO2  --  22 21 21   GLUCOSE 101* 103* 123* 100*  BUN 23 27* 28* 32*  CREATININE 1.20* 1.33* 1.40* 1.43*  CALCIUM  --  7.2* 7.6* 7.5*   GFR Estimated Creatinine Clearance: 25.8 ml/min (by C-G formula based on Cr of 1.43). Liver Function Tests:  Recent Labs Lab 12/13/12 0600  AST 20  ALT 18  ALKPHOS 59  BILITOT 0.5  PROT 6.2  ALBUMIN 3.2*   CBC:  Recent Labs Lab 12/13/12 0600 12/13/12 0610 12/13/12 2233 12/14/12 0540 12/15/12 0500  WBC 11.0*  --  17.4* 18.6* 13.9*  NEUTROABS 9.7*  --  15.9*  --   --   HGB 10.2* 11.2* 8.0* 8.2* 7.4*  HCT 31.4* 33.0* 23.8* 24.2* 21.8*  MCV 107.2*  --  105.8* 106.1* 105.3*  PLT 168  --  150 152 101*   BNP (last 3 results)  Recent Labs  03/19/12 1009  12/13/12 0600  PROBNP 4164.0* 7685.0*   CBG:  Recent Labs Lab 12/13/12 0651  GLUCAP 70   Microbiology Recent Results (from the past 240 hour(s))  CULTURE, BLOOD (ROUTINE X 2)     Status: None   Collection Time    12/13/12  8:35 AM      Result Value Range Status   Specimen Description BLOOD RIGHT ANTECUBITAL   Final   Special Requests BOTTLES DRAWN AEROBIC ONLY 4CC   Final   Culture  Setup Time     Final   Value: 12/13/2012 10:22     Performed at [facility]   Culture     Final   Value:        BLOOD CULTURE RECEIVED NO GROWTH TO DATE CULTURE WILL BE HELD FOR 5 DAYS BEFORE ISSUING A FINAL NEGATIVE REPORT     Performed at [facility]   Report Status PENDING   Incomplete  CULTURE, BLOOD (ROUTINE X 2)     Status: None   Collection Time    12/13/12  9:00 AM      Result Value Range Status   Specimen Description BLOOD RIGHT ANTECUBITAL   Final   Special Requests BOTTLES DRAWN AEROBIC AND ANAEROBIC 5CC   Final   Culture  Setup Time  Final   Value: 12/13/2012 10:22     Performed at [facility]   Culture     Final   Value:        BLOOD CULTURE RECEIVED NO GROWTH TO DATE CULTURE WILL BE HELD FOR 5 DAYS BEFORE ISSUING A FINAL NEGATIVE REPORT     Performed at [facility]   Report Status PENDING   Incomplete  MRSA PCR SCREENING     Status: None   Collection Time    12/13/12 11:09 AM      Result Value Range Status   MRSA by PCR NEGATIVE  NEGATIVE Final   Comment:            The GeneXpert MRSA Assay (FDA     approved for NASAL specimens     only), is one component of a     comprehensive MRSA colonization     surveillance program. It is not     intended to diagnose MRSA     infection nor to guide or     monitor treatment for     MRSA infections.  MRSA PCR SCREENING     Status: None   Collection Time    12/13/12  7:59 PM      Result Value Range Status   MRSA by PCR NEGATIVE  NEGATIVE Final   Comment:            The GeneXpert MRSA Assay  (FDA     approved for NASAL specimens     only), is one component of a     comprehensive MRSA colonization     surveillance program. It is not     intended to diagnose MRSA     infection nor to guide or     monitor treatment for     MRSA infections.     Procedures and Diagnostic Studies: Dg Chest 2 View  12/13/2012   *RADIOLOGY REPORT*  Clinical Data: Fever  CHEST - 2 VIEW  Comparison:  Study obtained earlier in the day and March 19, 2012  Findings:  There is a degree of underlying emphysematous change. There is mild scarring bilaterally.  There is no edema or consolidation. Heart is mildly enlarged with normal pulmonary vascularity.  Pacemaker leads are attached to the right atrium and right ventricle.  There is degenerative change in the thoracic spine.  No adenopathy.  IMPRESSION: Underlying emphysematous change.  No edema or consolidation.   Original Report Authenticated By: Bretta Bang, M.D.   Dg Chest Portable 1 View  12/13/2012   *RADIOLOGY REPORT*  Clinical Data: Bodyaches.  Shortness of breath.  Weakness.  PORTABLE CHEST - 1 VIEW  Comparison: Chest x-ray 03/19/2012.  Findings: Mild diffuse peribronchial cuffing.  No acute consolidative airspace disease.  No pleural effusions.  Lung volumes are normal.  No evidence of pulmonary edema.  Heart size cm borderline enlarged. The patient is rotated to the left on today's exam, resulting in distortion of the mediastinal contours and reduced diagnostic sensitivity and specificity for mediastinal pathology.  Atherosclerosis in the thoracic aorta.  Left-sided pacemaker device in place with lead tips projecting over the expected location of the right atrium and right ventricular apex.  IMPRESSION: 1.  Mild diffuse peribronchial cuffing may suggest bronchitis. 2.  Borderline cardiomegaly. 3.  Atherosclerosis.   Original Report Authenticated By: Trudie Reed, M.D.    Scheduled Meds: . atorvastatin  10 mg Oral QHS  . calcium-vitamin D  1  tablet Oral BID WC  .  citalopram  20 mg Oral Daily  . cyanocobalamin  500 mcg Oral Daily  . dabigatran  75 mg Oral Q12H  . folic acid  0.5 mg Oral Daily  . gabapentin  100 mg Oral BID WC  . gabapentin  400 mg Oral QHS  . hydrocortisone sod succinate (SOLU-CORTEF) inj  50 mg Intravenous Q6H  . isosorbide mononitrate  15 mg Oral Daily  . levothyroxine  25 mcg Oral Q0600  . pantoprazole  40 mg Oral Daily  . piperacillin-tazobactam (ZOSYN)  IV  3.375 g Intravenous Q8H  . potassium chloride SA  20 mEq Oral Daily  . sodium chloride  3 mL Intravenous Q12H  . sucralfate  1 g Oral TID WC & HS  . vancomycin  750 mg Intravenous Q24H   Continuous Infusions: . phenylephrine (NEO-SYNEPHRINE) Adult infusion 10 mcg/min (12/14/12 2300)    Time spent: 35 minutes. The patient is critically ill with acute renal failure, hypotension requiring pressor support and close monitoring.   LOS: 2 days   Kienan Doublin  [facility] Pager [phone].   *Please note that the hospitalists switch teams on Wednesdays. Please call the flow manager at [phone] if you are having difficulty reaching the hospitalist taking care of this patient as she can update you and provide the most up-to-date pager number of provider caring for the patient. If 8PM-8AM, please contact night-coverage at [email or web address], password [facility]  12/15/2012, 7:06 AM

## 2012-12-15 NOTE — Progress Notes (Signed)
  Echocardiogram 2D Echocardiogram has been performed.  Nicole Roberts M 12/15/2012, 9:55 AM

## 2012-12-16 LAB — PROCALCITONIN: Procalcitonin: 3.14 ng/mL

## 2012-12-16 LAB — BASIC METABOLIC PANEL
BUN: 36 mg/dL — ABNORMAL HIGH (ref 6–23)
GFR calc Af Amer: 37 mL/min — ABNORMAL LOW (ref >90)
GFR calc non Af Amer: 32 mL/min — ABNORMAL LOW (ref >90)
Potassium: 4.2 mEq/L (ref 3.5–5.1)
Sodium: 137 mEq/L (ref 135–145)

## 2012-12-16 LAB — CBC
MCHC: 33.8 g/dL (ref 30.0–36.0)
Platelets: 116 10*3/uL — ABNORMAL LOW (ref 150–400)
RDW: 18.4 % — ABNORMAL HIGH (ref 11.5–15.5)
WBC: 14.2 10*3/uL — ABNORMAL HIGH (ref 4.0–10.5)

## 2012-12-16 MED ORDER — SODIUM CHLORIDE 0.9 % IV SOLN: 250.0000 mL | INTRAVENOUS | Status: DC | PRN

## 2012-12-16 MED ORDER — SODIUM CHLORIDE 0.9 % IV SOLN
INTRAVENOUS | Status: DC
  Administered 2012-12-17 – 2012-12-19 (×4): via INTRAVENOUS

## 2012-12-16 NOTE — Progress Notes (Signed)
[facility] PROGRESS NOTE  Nicole Roberts ZOX:[id number] DOB: [birth year] DOA: 12/13/2012 PCP: [name], MD  Brief narrative: Nicole Roberts is an 79 y.o. female with a PMH of atrial fibrillation, sinus syndrome status post permanent pacemaker in 2007, breast cancer status post lumpectomy, hypertension, hyperlipidemia, hypothyroidism and pulmonary hypertension who was recently treated for cellulitis. A review of her records reveals that she was hospitalized 09/16/2012-09/21/2012 for treatment of cellulitis and was ultimately discharged to a skilled nursing facility. She was also noted to have diarrhea during that hospitalization. Clostridium difficile studies were negative and she completed a course of 5 days of IV Zosyn and vancomycin. Doppler studies were negative for DVT. She was discharged home on an oral course of doxycycline and Cipro. She has also been under the care of gastroenterology for evaluation of weight loss and GI symptoms including intermittent nausea/vomiting/diarrhea. She underwent an EGD on 10/18/2012 which showed esophagitis for which she was treated with Carafate and PPI therapy. She presented to the ER on 11/24/2012 with recurrent cellulitis for which she was given an IV dose of Cipro and sent home on oral doxycycline. She returned to the emergency department on 12/13/2012 with fever and ongoing complaints of left lower extremity erythema and tenderness. Upon admission, the patient was noted to be persistently hypotensive despite IV fluids and was transferred to the ICU and placed on pressor support.  Assessment/Plan: Principal Problem:   Severe sepsis with septic shock secondary to left lower extremity cellulitis in the setting of chronic venous stasis/edema  -Given fever and no other obvious source for this (urinalysis clear, chest x-ray negative for pneumonia), she is currently being treated for recurrent cellulitis with empiric vancomycin and Zosyn.  -Moved to the ICU  on 12/13/2012 secondary to persistent hypotension and elevated serum lactate. Lactic acid now within normal limits. -Currently off pressor support. -Blood cultures x2 pending.Pro-calcitonin 7.45--->5.64--->3.14. -Unna boots applied to lower extremities.  -Transfer to floor today. Active Problems: Thrombocytopenia -May be secondary to sepsis. Not on Lovenox but on Pradaxa. Watch for signs of bleeding. HYPOTHYROIDISM  -Continue home dose of Synthroid.  HYPERLIPIDEMIA  -Continue home dose of statin therapy.  Acute on chronic systolic CHF  -Last two-dimensional echocardiogram done 04/03/2012. EF 55-65%. No regional wall motion abnormalities. No mention of diastolic dysfunction.  -Ppro BNP markedly elevated at 7685. Diuretics on hold secondary to hypotension. No complaints of dyspnea. No evidence of clinically decompensated heart failure. -2-D echocardiogram done: EF 45% with akinesis of the mid-distal anteroseptal and apical myocardium, the study was not technically sufficient to allow evaluation of LV diastolic function. Macrocytic anemia  -Continue Folvite. 2-3 g drop in hemoglobin since admission likely reflective of dilutional factors, but watch for signs of bleeding given chronic anticoagulation. -Fecal occult blood testing was negative Atrial fibrillation  -On chronic anticoagulation with Pradaxa.  -Developed rapid ventricular response on 12/15/2012, responded to one dose of IV metoprolol and resumption of usual home dose of Toprol-XL. Stage III CKD (chronic kidney disease)  -Baseline creatinine variable but GFR ranges from 31-44. Current creatinine slightly above usual baseline values, likely reflective of hypotension. Continue to monitor. Depression  -Continue Celexa. Note: Dosage reduced to 20 mg daily per pharmacy recommendations to avoid prolonged QTC in patients greater than age 50. Hypokalemia  -Potassium corrected with increase KCL supplementation to BID.  Rheumatoid arthritis   -Hold Arava in the setting of acute infection.  -On stress dose steroids given chronic treatment with prednisone 5 mg daily.  Code Status: Full.  Family Communication:  Son updated by Maren Reamer, NP 12/13/12. Left message for him (Art at [phone]) 12/14/12. Disposition Plan: Back to ALF when stable.    Medical Consultants:  Dr. Cyril Mourning, Critical care  Other Consultants:  None.  Anti-infectives:  Vancomycin 12/13/2012--->  Zosyn 12/13/2012--->  HPI/Subjective: Nicole Roberts continues to feel well, overall. Her fever curve is down. She is fatigued and her appetite is still fair. Denies dyspnea, chest pressure and chest pain.  Objective: Filed Vitals:   12/16/12 0400 12/16/12 0500 12/16/12 0600 12/16/12 0700  BP:      Pulse: 79 78 55 75  Temp: 99.3 F (37.4 C) 99.1 F (37.3 C) 99 F (37.2 C) 99 F (37.2 C)  TempSrc: Core (Comment)     Resp: 16 15 20 15   Height:      Weight:      SpO2: 100% 98% 80% 98%    Intake/Output Summary (Last 24 hours) at 12/16/12 0711 Last data filed at 12/16/12 0700  Gross per 24 hour  Intake   1508 ml  Output      0 ml  Net   1508 ml    Exam: Gen:  NAD Cardiovascular:  RRR, No M/R/G Respiratory:  Lungs CTAB Gastrointestinal:  Abdomen soft, NT/ND, + BS Extremities:  Unna boots in place, + edema  Data Reviewed: Basic Metabolic Panel:  Recent Labs Lab 12/13/12 0610 12/13/12 2233 12/14/12 0540 12/15/12 0500 12/16/12 0500  NA 140 137 137 137 137  K 3.4* 3.7 4.0 3.5 4.2  CL 104 106 105 105 105  CO2  --  22 21 21 19   GLUCOSE 101* 103* 123* 100* 107*  BUN 23 27* 28* 32* 36*  CREATININE 1.20* 1.33* 1.40* 1.43* 1.41*  CALCIUM  --  7.2* 7.6* 7.5* 8.1*   GFR Estimated Creatinine Clearance: 26.1 ml/min (by C-G formula based on Cr of 1.41). Liver Function Tests:  Recent Labs Lab 12/13/12 0600  AST 20  ALT 18  ALKPHOS 59  BILITOT 0.5  PROT 6.2  ALBUMIN 3.2*   CBC:  Recent Labs Lab 12/13/12 0600 12/13/12 0610  12/13/12 2233 12/14/12 0540 12/15/12 0500 12/16/12 0500  WBC 11.0*  --  17.4* 18.6* 13.9* 14.2*  NEUTROABS 9.7*  --  15.9*  --   --   --   HGB 10.2* 11.2* 8.0* 8.2* 7.4* 8.1*  HCT 31.4* 33.0* 23.8* 24.2* 21.8* 24.0*  MCV 107.2*  --  105.8* 106.1* 105.3* 105.3*  PLT 168  --  150 152 101* 116*   BNP (last 3 results)  Recent Labs  03/19/12 1009 12/13/12 0600  PROBNP 4164.0* 7685.0*   CBG:  Recent Labs Lab 12/13/12 0651  GLUCAP 70   Sepsis Markers:  Ref. Range 12/14/2012 06:15 12/14/2012 10:46 12/15/2012 05:00 12/15/2012 08:00 12/16/2012 05:00  Procalcitonin No range found   5.64  3.14    Ref. Range 12/13/2012 07:16 12/13/2012 22:33 12/14/2012 05:40  Lactic Acid, Venous Latest Range: 0.5-2.2 mmol/L 3.71 (H) 2.2     Microbiology Recent Results (from the past 240 hour(s))  CULTURE, BLOOD (ROUTINE X 2)     Status: None   Collection Time    12/13/12  8:35 AM      Result Value Range Status   Specimen Description BLOOD RIGHT ANTECUBITAL   Final   Special Requests BOTTLES DRAWN AEROBIC ONLY 4CC   Final   Culture  Setup Time     Final   Value: 12/13/2012 10:22     Performed at [facility]  Culture     Final   Value:        BLOOD CULTURE RECEIVED NO GROWTH TO DATE CULTURE WILL BE HELD FOR 5 DAYS BEFORE ISSUING A FINAL NEGATIVE REPORT     Performed at [facility]   Report Status PENDING   Incomplete  CULTURE, BLOOD (ROUTINE X 2)     Status: None   Collection Time    12/13/12  9:00 AM      Result Value Range Status   Specimen Description BLOOD RIGHT ANTECUBITAL   Final   Special Requests BOTTLES DRAWN AEROBIC AND ANAEROBIC 5CC   Final   Culture  Setup Time     Final   Value: 12/13/2012 10:22     Performed at [facility]   Culture     Final   Value:        BLOOD CULTURE RECEIVED NO GROWTH TO DATE CULTURE WILL BE HELD FOR 5 DAYS BEFORE ISSUING A FINAL NEGATIVE REPORT     Performed at [facility]   Report Status PENDING   Incomplete  MRSA PCR  SCREENING     Status: None   Collection Time    12/13/12 11:09 AM      Result Value Range Status   MRSA by PCR NEGATIVE  NEGATIVE Final   Comment:            The GeneXpert MRSA Assay (FDA     approved for NASAL specimens     only), is one component of a     comprehensive MRSA colonization     surveillance program. It is not     intended to diagnose MRSA     infection nor to guide or     monitor treatment for     MRSA infections.  MRSA PCR SCREENING     Status: None   Collection Time    12/13/12  7:59 PM      Result Value Range Status   MRSA by PCR NEGATIVE  NEGATIVE Final   Comment:            The GeneXpert MRSA Assay (FDA     approved for NASAL specimens     only), is one component of a     comprehensive MRSA colonization     surveillance program. It is not     intended to diagnose MRSA     infection nor to guide or     monitor treatment for     MRSA infections.     Procedures and Diagnostic Studies: Dg Chest 2 View  12/13/2012   *RADIOLOGY REPORT*  Clinical Data: Fever  CHEST - 2 VIEW  Comparison:  Study obtained earlier in the day and March 19, 2012  Findings:  There is a degree of underlying emphysematous change. There is mild scarring bilaterally.  There is no edema or consolidation. Heart is mildly enlarged with normal pulmonary vascularity.  Pacemaker leads are attached to the right atrium and right ventricle.  There is degenerative change in the thoracic spine.  No adenopathy.  IMPRESSION: Underlying emphysematous change.  No edema or consolidation.   Original Report Authenticated By: Bretta Bang, M.D.   Dg Chest Portable 1 View  12/13/2012   *RADIOLOGY REPORT*  Clinical Data: Bodyaches.  Shortness of breath.  Weakness.  PORTABLE CHEST - 1 VIEW  Comparison: Chest x-ray 03/19/2012.  Findings: Mild diffuse peribronchial cuffing.  No acute consolidative airspace disease.  No pleural effusions.  Lung volumes are  normal.  No evidence of pulmonary edema.  Heart size cm  borderline enlarged. The patient is rotated to the left on today's exam, resulting in distortion of the mediastinal contours and reduced diagnostic sensitivity and specificity for mediastinal pathology.  Atherosclerosis in the thoracic aorta.  Left-sided pacemaker device in place with lead tips projecting over the expected location of the right atrium and right ventricular apex.  IMPRESSION: 1.  Mild diffuse peribronchial cuffing may suggest bronchitis. 2.  Borderline cardiomegaly. 3.  Atherosclerosis.   Original Report Authenticated By: Trudie Reed, M.D.    Scheduled Meds: . atorvastatin  10 mg Oral QHS  . calcium-vitamin D  1 tablet Oral BID WC  . citalopram  20 mg Oral Daily  . cyanocobalamin  500 mcg Oral Daily  . dabigatran  75 mg Oral Q12H  . feeding supplement  237 mL Oral BID BM  . fentaNYL  100 mcg Transdermal Q72H  . folic acid  0.5 mg Oral Daily  . gabapentin  100 mg Oral BID WC  . gabapentin  400 mg Oral QHS  . hydrocortisone sod succinate (SOLU-CORTEF) inj  50 mg Intravenous Q6H  . isosorbide mononitrate  15 mg Oral Daily  . levothyroxine  25 mcg Oral Q0600  . metoprolol succinate  50 mg Oral BID  . pantoprazole  40 mg Oral Daily  . piperacillin-tazobactam (ZOSYN)  IV  3.375 g Intravenous Q8H  . potassium chloride SA  20 mEq Oral BID  . sodium chloride  3 mL Intravenous Q12H  . sucralfate  1 g Oral TID WC & HS  . vancomycin  750 mg Intravenous Q24H   Continuous Infusions: . phenylephrine (NEO-SYNEPHRINE) Adult infusion 10 mcg/min (12/14/12 2300)    Time spent: 35 minutes with greater than 50% of time spent discussing clinical impressions, diagnostic test results and plan of care.   LOS: 3 days   Nicole Roberts  [facility] Pager [phone].   *Please note that the hospitalists switch teams on Wednesdays. Please call the flow manager at [phone] if you are having difficulty reaching the hospitalist taking care of this patient as she can update you and provide  the most up-to-date pager number of provider caring for the patient. If 8PM-8AM, please contact night-coverage at [email or web address], password [email or web address]  12/16/2012, 7:11 AM

## 2012-12-17 LAB — URINALYSIS, ROUTINE W REFLEX MICROSCOPIC
Ketones, ur: 15 mg/dL — AB
Nitrite: POSITIVE — AB
Protein, ur: 100 mg/dL — AB
pH: 5 (ref 5.0–8.0)

## 2012-12-17 LAB — CBC
Hemoglobin: 7.9 g/dL — ABNORMAL LOW (ref 12.0–15.0)
MCHC: 32.8 g/dL (ref 30.0–36.0)
RBC: 2.26 MIL/uL — ABNORMAL LOW (ref 3.87–5.11)
WBC: 9.7 10*3/uL (ref 4.0–10.5)

## 2012-12-17 LAB — BASIC METABOLIC PANEL
Chloride: 108 mEq/L (ref 96–112)
GFR calc non Af Amer: 31 mL/min — ABNORMAL LOW (ref >90)
Glucose, Bld: 104 mg/dL — ABNORMAL HIGH (ref 70–99)
Potassium: 4.5 mEq/L (ref 3.5–5.1)
Sodium: 139 mEq/L (ref 135–145)

## 2012-12-17 LAB — OCCULT BLOOD X 1 CARD TO LAB, STOOL: Fecal Occult Bld: NEGATIVE

## 2012-12-17 MED ORDER — MAGIC MOUTHWASH W/LIDOCAINE
5.0000 mL | Freq: Four times a day (QID) | ORAL | Status: DC
  Administered 2012-12-17 – 2012-12-24 (×24): 5 mL via ORAL
  Filled 2012-12-17 (×31): qty 5

## 2012-12-17 MED ORDER — VANCOMYCIN HCL 500 MG IV SOLR
500.0000 mg | INTRAVENOUS | Status: DC
  Administered 2012-12-17 – 2012-12-21 (×5): 500 mg via INTRAVENOUS
  Filled 2012-12-17 (×8): qty 500

## 2012-12-17 NOTE — Progress Notes (Signed)
[facility] PROGRESS NOTE  Nicole Roberts OZH:[id number] DOB: 1921/08/29 DOA: 12/13/2012 PCP: [name], MD  Brief narrative: Nicole Roberts is an 79 y.o. female with a PMH of atrial fibrillation, sinus syndrome status post permanent pacemaker in 2007, breast cancer status post lumpectomy, hypertension, hyperlipidemia, hypothyroidism and pulmonary hypertension who was recently treated for cellulitis. A review of her records reveals that she was hospitalized 09/16/2012-09/21/2012 for treatment of cellulitis and was ultimately discharged to a skilled nursing facility. She was also noted to have diarrhea during that hospitalization. Clostridium difficile studies were negative and she completed a course of 5 days of IV Zosyn and vancomycin. Doppler studies were negative for DVT. She was discharged home on an oral course of doxycycline and Cipro. She has also been under the care of gastroenterology for evaluation of weight loss and GI symptoms including intermittent nausea/vomiting/diarrhea. She underwent an EGD on 10/18/2012 which showed esophagitis for which she was treated with Carafate and PPI therapy. She presented to the ER on 11/24/2012 with recurrent cellulitis for which she was given an IV dose of Cipro and sent home on oral doxycycline. She returned to the emergency department on 12/13/2012 with fever and ongoing complaints of left lower extremity erythema and tenderness. Upon admission, the patient was noted to be persistently hypotensive despite IV fluids and was transferred to the ICU and placed on pressor support.  Assessment/Plan: Principal Problem:   Severe sepsis with septic shock secondary to left lower extremity cellulitis in the setting of chronic venous stasis/edema  -Given fever and no other obvious source for this (urinalysis clear, chest x-ray negative for pneumonia), she is currently being treated for recurrent cellulitis with empiric vancomycin and Zosyn.  -Moved to the ICU  on 12/13/2012 secondary to persistent hypotension and elevated serum lactate necessitating pressor support. Lactic acid now within normal limits.  Moved out of the ICU 12/16/12. -Blood cultures x2 pending.Pro-calcitonin 7.45--->5.64--->3.14. -Unna boots applied to lower extremities.  -PT evaluation. Active Problems: Thrombocytopenia -May be secondary to sepsis. Not on Lovenox but on Pradaxa. Watch for signs of bleeding. HYPOTHYROIDISM  -Continue home dose of Synthroid.  HYPERLIPIDEMIA  -Continue home dose of statin therapy.  Acute on chronic systolic CHF  -Last two-dimensional echocardiogram done 04/03/2012. EF 55-65%. No regional wall motion abnormalities. No mention of diastolic dysfunction.  -Ppro BNP markedly elevated at 7685. Diuretics on hold secondary to hypotension. No complaints of dyspnea. No evidence of clinically decompensated heart failure. -2-D echocardiogram done: EF 45% with akinesis of the mid-distal anteroseptal and apical myocardium, the study was not technically sufficient to allow evaluation of LV diastolic function. Macrocytic anemia  -Continue Folvite. 2-3 g drop in hemoglobin since admission likely reflective of dilutional factors, but watch for signs of bleeding given chronic anticoagulation. -Fecal occult blood testing was negative Atrial fibrillation  -On chronic anticoagulation with Pradaxa.  -Developed rapid ventricular response on 12/15/2012, responded to one dose of IV metoprolol and resumption of usual home dose of Toprol-XL. Stage III CKD (chronic kidney disease)  -Baseline creatinine variable but GFR ranges from 31-44. Current creatinine slightly above usual baseline values, likely reflective of hypotension. Continue to monitor. Depression  -Continue Celexa. Note: Dosage reduced to 20 mg daily per pharmacy recommendations to avoid prolonged QTC in patients greater than age 51. Hypokalemia  -Potassium corrected with increase KCL supplementation to BID.   Rheumatoid arthritis  -Hold Arava in the setting of acute infection.  -On stress dose steroids given chronic treatment with prednisone 5 mg daily.  Code Status:  Full.  Family Communication: Son updated by Maren Reamer, NP 12/13/12. Left message for him (Art at [phone]) 12/14/12. Disposition Plan: Back to ALF when stable.    Medical Consultants:  Dr. Cyril Mourning, Critical care  Other Consultants:  None.  Anti-infectives:  Vancomycin 12/13/2012--->  Zosyn 12/13/2012--->  HPI/Subjective: [name] continues to feel well, overall. No fevers. She is fatigued and her appetite is still fair. Denies dyspnea, chest pressure and chest pain.   Complains of mouth soreness.  Objective: Filed Vitals:   12/16/12 1258 12/16/12 1914 12/16/12 2140 12/17/12 0547  BP: 113/68 130/84 132/76 123/80  Pulse: 61 69 76 75  Temp: 98.4 F (36.9 C) 97.5 F (36.4 C) 97.3 F (36.3 C) 97.4 F (36.3 C)  TempSrc: Oral Oral Oral Oral  Resp: 16 16 20 20   Height:      Weight:      SpO2: 100% 98% 100% 98%    Intake/Output Summary (Last 24 hours) at 12/17/12 0813 Last data filed at 12/17/12 0600  Gross per 24 hour  Intake   2490 ml  Output    425 ml  Net   2065 ml    Exam: Gen:  NAD Cardiovascular:  RRR, No M/R/G Respiratory:  Lungs CTAB Gastrointestinal:  Abdomen soft, NT/ND, + BS Extremities:  Unna boots in place, + edema  Data Reviewed: Basic Metabolic Panel:  Recent Labs Lab 12/13/12 2233 12/14/12 0540 12/15/12 0500 12/16/12 0500 12/17/12 0405  NA 137 137 137 137 139  K 3.7 4.0 3.5 4.2 4.5  CL 106 105 105 105 108  CO2 22 21 21 19 21   GLUCOSE 103* 123* 100* 107* 104*  BUN 27* 28* 32* 36* 39*  CREATININE 1.33* 1.40* 1.43* 1.41* 1.44*  CALCIUM 7.2* 7.6* 7.5* 8.1* 8.1*   GFR Estimated Creatinine Clearance: 25.6 ml/min (by C-G formula based on Cr of 1.44). Liver Function Tests:  Recent Labs Lab 12/13/12 0600  AST 20  ALT 18  ALKPHOS 59  BILITOT 0.5  PROT 6.2   ALBUMIN 3.2*   CBC:  Recent Labs Lab 12/13/12 0600  12/13/12 2233 12/14/12 0540 12/15/12 0500 12/16/12 0500 12/17/12 0405  WBC 11.0*  --  17.4* 18.6* 13.9* 14.2* 9.7  NEUTROABS 9.7*  --  15.9*  --   --   --   --   HGB 10.2*  < > 8.0* 8.2* 7.4* 8.1* 7.9*  HCT 31.4*  < > 23.8* 24.2* 21.8* 24.0* 24.1*  MCV 107.2*  --  105.8* 106.1* 105.3* 105.3* 106.6*  PLT 168  --  150 152 101* 116* 103*  < > = values in this interval not displayed. BNP (last 3 results)  Recent Labs  03/19/12 1009 12/13/12 0600  PROBNP 4164.0* 7685.0*   CBG:  Recent Labs Lab 12/13/12 0651  GLUCAP 70   Sepsis Markers:  Ref. Range 12/14/2012 06:15 12/14/2012 10:46 12/15/2012 05:00 12/15/2012 08:00 12/16/2012 05:00  Procalcitonin No range found   5.64  3.14    Ref. Range 12/13/2012 07:16 12/13/2012 22:33 12/14/2012 05:40  Lactic Acid, Venous Latest Range: 0.5-2.2 mmol/L 3.71 (H) 2.2     Microbiology Recent Results (from the past 240 hour(s))  CULTURE, BLOOD (ROUTINE X 2)     Status: None   Collection Time    12/13/12  8:35 AM      Result Value Range Status   Specimen Description BLOOD RIGHT ANTECUBITAL   Final   Special Requests BOTTLES DRAWN AEROBIC ONLY 4CC   Final   Culture  Setup Time     Final   Value: 12/13/2012 10:22     Performed at [facility]   Culture     Final   Value:        BLOOD CULTURE RECEIVED NO GROWTH TO DATE CULTURE WILL BE HELD FOR 5 DAYS BEFORE ISSUING A FINAL NEGATIVE REPORT     Performed at [facility]   Report Status PENDING   Incomplete  CULTURE, BLOOD (ROUTINE X 2)     Status: None   Collection Time    12/13/12  9:00 AM      Result Value Range Status   Specimen Description BLOOD RIGHT ANTECUBITAL   Final   Special Requests BOTTLES DRAWN AEROBIC AND ANAEROBIC 5CC   Final   Culture  Setup Time     Final   Value: 12/13/2012 10:22     Performed at [facility]   Culture     Final   Value:        BLOOD CULTURE RECEIVED NO GROWTH TO DATE CULTURE  WILL BE HELD FOR 5 DAYS BEFORE ISSUING A FINAL NEGATIVE REPORT     Performed at [facility]   Report Status PENDING   Incomplete  MRSA PCR SCREENING     Status: None   Collection Time    12/13/12 11:09 AM      Result Value Range Status   MRSA by PCR NEGATIVE  NEGATIVE Final   Comment:            The GeneXpert MRSA Assay (FDA     approved for NASAL specimens     only), is one component of a     comprehensive MRSA colonization     surveillance program. It is not     intended to diagnose MRSA     infection nor to guide or     monitor treatment for     MRSA infections.  MRSA PCR SCREENING     Status: None   Collection Time    12/13/12  7:59 PM      Result Value Range Status   MRSA by PCR NEGATIVE  NEGATIVE Final   Comment:            The GeneXpert MRSA Assay (FDA     approved for NASAL specimens     only), is one component of a     comprehensive MRSA colonization     surveillance program. It is not     intended to diagnose MRSA     infection nor to guide or     monitor treatment for     MRSA infections.     Procedures and Diagnostic Studies: Dg Chest 2 View  12/13/2012   *RADIOLOGY REPORT*  Clinical Data: Fever  CHEST - 2 VIEW  Comparison:  Study obtained earlier in the day and March 19, 2012  Findings:  There is a degree of underlying emphysematous change. There is mild scarring bilaterally.  There is no edema or consolidation. Heart is mildly enlarged with normal pulmonary vascularity.  Pacemaker leads are attached to the right atrium and right ventricle.  There is degenerative change in the thoracic spine.  No adenopathy.  IMPRESSION: Underlying emphysematous change.  No edema or consolidation.   Original Report Authenticated By: Bretta Bang, M.D.   Dg Chest Portable 1 View  12/13/2012   *RADIOLOGY REPORT*  Clinical Data: Bodyaches.  Shortness of breath.  Weakness.  PORTABLE CHEST - 1 VIEW  Comparison:  Chest x-ray 03/19/2012.  Findings: Mild diffuse  peribronchial cuffing.  No acute consolidative airspace disease.  No pleural effusions.  Lung volumes are normal.  No evidence of pulmonary edema.  Heart size cm borderline enlarged. The patient is rotated to the left on today's exam, resulting in distortion of the mediastinal contours and reduced diagnostic sensitivity and specificity for mediastinal pathology.  Atherosclerosis in the thoracic aorta.  Left-sided pacemaker device in place with lead tips projecting over the expected location of the right atrium and right ventricular apex.  IMPRESSION: 1.  Mild diffuse peribronchial cuffing may suggest bronchitis. 2.  Borderline cardiomegaly. 3.  Atherosclerosis.   Original Report Authenticated By: Trudie Reed, M.D.    Scheduled Meds: . atorvastatin  10 mg Oral QHS  . calcium-vitamin D  1 tablet Oral BID WC  . citalopram  20 mg Oral Daily  . cyanocobalamin  500 mcg Oral Daily  . dabigatran  75 mg Oral Q12H  . feeding supplement  237 mL Oral BID BM  . fentaNYL  100 mcg Transdermal Q72H  . folic acid  0.5 mg Oral Daily  . gabapentin  100 mg Oral BID WC  . gabapentin  400 mg Oral QHS  . hydrocortisone sod succinate (SOLU-CORTEF) inj  50 mg Intravenous Q6H  . isosorbide mononitrate  15 mg Oral Daily  . levothyroxine  25 mcg Oral Q0600  . metoprolol succinate  50 mg Oral BID  . pantoprazole  40 mg Oral Daily  . piperacillin-tazobactam (ZOSYN)  IV  3.375 g Intravenous Q8H  . potassium chloride SA  20 mEq Oral BID  . sodium chloride  3 mL Intravenous Q12H  . sucralfate  1 g Oral TID WC & HS  . vancomycin  750 mg Intravenous Q24H   Continuous Infusions: . sodium chloride 100 mL/hr at 12/17/12 0055    Time spent: 25 minutes.   LOS: 4 days   Cathaleen Korol  [facility] Pager [phone].   *Please note that the hospitalists switch teams on Wednesdays. Please call the flow manager at [phone] if you are having difficulty reaching the hospitalist taking care of this patient as she can  update you and provide the most up-to-date pager number of provider caring for the patient. If 8PM-8AM, please contact night-coverage at [email or web address], password [facility]  12/17/2012, 8:13 AM

## 2012-12-17 NOTE — Progress Notes (Signed)
ANTIBIOTIC CONSULT NOTE - follow-up  Pharmacy Consult for Vancomycin & Zosyn Indication: Sepsis secondary to LLE cellulitis in setting of chronic venous stasis  Allergies  Allergen Reactions  . Sulfa Antibiotics Itching  . Tramadol Hcl Itching and Rash   Patient Measurements: Height: 5' 4.17" (163 cm) (taken 10/15/12) Weight: 148 lb 6.4 oz (67.314 kg) (taken 11/28/12) IBW/kg (Calculated) : 55.1  Vital Signs: Temp: 97.4 F (36.3 C) (08/18 0547) Temp src: Oral (08/18 0547) BP: 123/80 mmHg (08/18 0547) Pulse Rate: 75 (08/18 0547) Intake/Output from previous day: 08/17 0701 - 08/18 0700 In: 2490 [P.O.:720; I.V.:1420; IV Piggyback:250] Out: 425 [Urine:425] Intake/Output from this shift: Total I/O In: 120 [P.O.:120] Out: 51 [Urine:50; Stool:1]  Labs:  Recent Labs  12/15/12 0500 12/16/12 0500 12/17/12 0405  WBC 13.9* 14.2* 9.7  HGB 7.4* 8.1* 7.9*  PLT 101* 116* 103*  CREATININE 1.43* 1.41* 1.44*   Estimated Creatinine Clearance: 25.6 ml/min (by C-G formula based on Cr of 1.44).  Recent Labs  12/17/12 0945  VANCOTROUGH 20.2*     Microbiology: Recent Results (from the past 720 hour(s))  CULTURE, BLOOD (ROUTINE X 2)     Status: None   Collection Time    12/13/12  8:35 AM      Result Value Range Status   Specimen Description BLOOD RIGHT ANTECUBITAL   Final   Special Requests BOTTLES DRAWN AEROBIC ONLY 4CC   Final   Culture  Setup Time     Final   Value: 12/13/2012 10:22     Performed at [facility]   Culture     Final   Value:        BLOOD CULTURE RECEIVED NO GROWTH TO DATE CULTURE WILL BE HELD FOR 5 DAYS BEFORE ISSUING A FINAL NEGATIVE REPORT     Performed at [facility]   Report Status PENDING   Incomplete  CULTURE, BLOOD (ROUTINE X 2)     Status: None   Collection Time    12/13/12  9:00 AM      Result Value Range Status   Specimen Description BLOOD RIGHT ANTECUBITAL   Final   Special Requests BOTTLES DRAWN AEROBIC AND ANAEROBIC 5CC   Final    Culture  Setup Time     Final   Value: 12/13/2012 10:22     Performed at [facility]   Culture     Final   Value:        BLOOD CULTURE RECEIVED NO GROWTH TO DATE CULTURE WILL BE HELD FOR 5 DAYS BEFORE ISSUING A FINAL NEGATIVE REPORT     Performed at [facility]   Report Status PENDING   Incomplete  MRSA PCR SCREENING     Status: None   Collection Time    12/13/12 11:09 AM      Result Value Range Status   MRSA by PCR NEGATIVE  NEGATIVE Final   Comment:            The GeneXpert MRSA Assay (FDA     approved for NASAL specimens     only), is one component of a     comprehensive MRSA colonization     surveillance program. It is not     intended to diagnose MRSA     infection nor to guide or     monitor treatment for     MRSA infections.  MRSA PCR SCREENING     Status: None   Collection Time    12/13/12  7:59  PM      Result Value Range Status   MRSA by PCR NEGATIVE  NEGATIVE Final   Comment:            The GeneXpert MRSA Assay (FDA     approved for NASAL specimens     only), is one component of a     comprehensive MRSA colonization     surveillance program. It is not     intended to diagnose MRSA     infection nor to guide or     monitor treatment for     MRSA infections.    Medical History: Past Medical History  Diagnosis Date  . HEARING LOSS   . MITRAL VALVE INSUFF&AORTIC VALVE INSUFF     moderate MR  . PULMONARY HYPERTENSION   . Atrial fibrillation     permanent  . SICK SINUS SYNDROME     a. Tachybrady syndrome - Guidant PPM 10/2005.  . Edema 03/19/2009    due to venous insufficiency, chronic lymphedema  . URINARY INCONTINENCE   . Rheumatoid arthritis(714.0) dx clarified 2011    On prednisone  . BREAST CANCER 12/2006    ductal ca s/p L lumpectomy  . Diastolic dysfunction   . HYPERTENSION   . HYPERLIPIDEMIA   . GERD   . HYPOTHYROIDISM   . Hyperparathyroidism     2 lobes removed  . Venous insufficiency     chronic BLE edema  . Spinal  stenosis   . Anemia     a. Macrocytic - normal B12, folate 08/2011. b. 3/6 heme positive stools 10/2011 - per PCP note, elected against colonscopy.  [name] TIA (transient ischemic attack) 05/2011  . Anxiety    Medications:  Scheduled:  . atorvastatin  10 mg Oral QHS  . calcium-vitamin D  1 tablet Oral BID WC  . citalopram  20 mg Oral Daily  . cyanocobalamin  500 mcg Oral Daily  . dabigatran  75 mg Oral Q12H  . feeding supplement  237 mL Oral BID BM  . fentaNYL  100 mcg Transdermal Q72H  . folic acid  0.5 mg Oral Daily  . gabapentin  100 mg Oral BID WC  . gabapentin  400 mg Oral QHS  . hydrocortisone sod succinate (SOLU-CORTEF) inj  50 mg Intravenous Q6H  . isosorbide mononitrate  15 mg Oral Daily  . levothyroxine  25 mcg Oral Q0600  . metoprolol succinate  50 mg Oral BID  . pantoprazole  40 mg Oral Daily  . piperacillin-tazobactam (ZOSYN)  IV  3.375 g Intravenous Q8H  . potassium chloride SA  20 mEq Oral BID  . sodium chloride  3 mL Intravenous Q12H  . sucralfate  1 g Oral TID WC & HS  . vancomycin  500 mg Intravenous Q24H   Anti-infectives   Start     Dose/Rate Route Frequency Ordered Stop   12/17/12 2000  vancomycin (VANCOCIN) 500 mg in sodium chloride 0.9 % 100 mL IVPB     500 mg 100 mL/hr over 60 Minutes Intravenous Every 24 hours 12/17/12 1052     12/15/12 1000  vancomycin (VANCOCIN) IVPB 750 mg/150 ml premix  Status:  Discontinued     750 mg 150 mL/hr over 60 Minutes Intravenous Every 24 hours 12/14/12 0951 12/17/12 1051   12/14/12 1000  vancomycin (VANCOCIN) IVPB 1000 mg/200 mL premix     1,000 mg 200 mL/hr over 60 Minutes Intravenous Every 24 hours 12/13/12 1232 12/14/12 1057   12/13/12 1700  piperacillin-tazobactam (ZOSYN)  IVPB 3.375 g     3.375 g 12.5 mL/hr over 240 Minutes Intravenous Every 8 hours 12/13/12 1232     12/13/12 0630  vancomycin (VANCOCIN) IVPB 1000 mg/200 mL premix     1,000 mg 200 mL/hr over 60 Minutes Intravenous  Once 12/13/12 0627 12/13/12 1104    12/13/12 0630  piperacillin-tazobactam (ZOSYN) IVPB 3.375 g     3.375 g 100 mL/hr over 30 Minutes Intravenous  Once 12/13/12 1610 12/13/12 1005     Assessment: 79 y/o F developed septic shock due to uncertain source and was placed on empiric vancomycin + Zosyn.   After diagnostic workup was completed the etiology is now thought to be cellulitis of LLE.    Currently on D#5 vancomycin 750 mg IV q24h / Zosyn 3.375 grams IV q8h (extended-infusion)  SCr stable, but vancomycin trough 20.2 - at upper end of goal range.  Anticipate vancomycin trough likely to rise further due to ongoing drug accumulation unless dosage is reduced.  Risk factors for vancomycin-associated nephrotoxicity in this patient include age and concomitant Zosyn use.  . Goal of Therapy:  Vancomycin trough level 15-20 mcg/ml  Plan:  1. Reduce vancomycin to 500 mg IV q24h - next dose tonight at 2000. 2. Continue Zosyn 3.375 grams IV q8h (extended-infusion). 3. Follow serum creatinine closely while on vancomycin.  Elie Goody, PharmD, BCPS Pager: [phone] 12/17/2012  11:01 AM

## 2012-12-17 NOTE — Evaluation (Signed)
Physical Therapy Evaluation Patient Details Name: Nicole Roberts MRN: [id number] DOB: 03-26-1922 Today's Date: 12/17/2012 Time: 7829-5621 PT Time Calculation (min): 30 min  PT Assessment / Plan / Recommendation History of Present Illness  Nicole Roberts is an 79 y.o. female with a PMH of atrial fibrillation, sinus syndrome status post permanent pacemaker in 2007, breast cancer status post lumpectomy, hypertension, hyperlipidemia, hypothyroidism and pulmonary hypertension who was recently treated for cellulitis. She is admitted with pain all over and fever and was given the dx of sepsis    Clinical Impression  Pt is very cooperative with PT and wants to get better, but she has decreased activity tolerance.  She is also limited by edema in LE's Discussed with RN about changing compression dressing to address feet edema. Anticipate pt will progress slowly and will benefit from continued PT at SNF    PT Assessment  Patient needs continued PT services    Follow Up Recommendations  SNF    Does the patient have the potential to tolerate intense rehabilitation      Barriers to Discharge        Equipment Recommendations  None recommended by PT    Recommendations for Other Services OT consult   Frequency Min 3X/week    Precautions / Restrictions Precautions Precautions: Fall   Pertinent Vitals/Pain Pt reports she feels bad and has pain "all over"      Mobility  Bed Mobility Bed Mobility: Rolling Right;Rolling Left;Supine to Sit;Sit to Supine Rolling Right: 1: +2 Total assist Rolling Right: Patient Percentage: 50% Rolling Left: 1: +2 Total assist Rolling Left: Patient Percentage: 50% Supine to Sit: 1: +2 Total assist;HOB elevated;With rails Sit to Supine: 1: +2 Total assist;HOB flat Sit to Supine: Patient Percentage: 50% Details for Bed Mobility Assistance: Flat spin techique used to bring pt to edge of bed.  O2 was maintained, but pt developed audible dyspnea sitting on edge of  bed.  She fatigued quickly and asked to return to supine and postitioned on her right side to rest Transfers Details for Transfer Assistance: Pt unable/unwilling to try to stand today due to dyspnea    Exercises General Exercises - Lower Extremity Ankle Circles/Pumps: AAROM;Both;10 reps;Supine Short Arc Quad: AROM;Both;10 reps;Supine Hip ABduction/ADduction: AAROM;Both;10 reps;Supine Hip Flexion/Marching: AAROM;Both;10 reps;Supine Shoulder Exercises Shoulder Flexion: AAROM;Both;5 reps;Supine (edema noted in both arms)   PT Diagnosis: Generalized weakness;Acute pain  PT Problem List: Decreased strength;Decreased activity tolerance;Decreased mobility;Decreased safety awareness;Pain PT Treatment Interventions: Functional mobility training;Therapeutic activities;Therapeutic exercise;Balance training;Gait training;Patient/family education     PT Goals(Current goals can be found in the care plan section) Acute Rehab PT Goals Patient Stated Goal: to feel better PT Goal Formulation: With patient Time For Goal Achievement: 12/31/12 Potential to Achieve Goals: Fair  Visit Information  Last PT Received On: 12/17/12 History of Present Illness: Nicole Roberts is an 79 y.o. female with a PMH of atrial fibrillation, sinus syndrome status post permanent pacemaker in 2007, breast cancer status post lumpectomy, hypertension, hyperlipidemia, hypothyroidism and pulmonary hypertension who was recently treated for cellulitis. She is admitted with pain all over and fever and was given the dx of sepsis         Prior Functioning  Home Living Family/patient expects to be discharged to:: Skilled nursing facility Prior Function Comments: nursing reports pt did not walk much at home?? Communication Communication: No difficulties    Cognition  Cognition Arousal/Alertness: Awake/alert Behavior During Therapy: WFL for tasks assessed/performed Overall Cognitive Status: No family/caregiver present to  determine baseline cognitive  functioning Memory: Decreased recall of precautions;Decreased short-term memory (pt not a good historian)    Extremity/Trunk Assessment Lower Extremity Assessment Lower Extremity Assessment: RLE deficits/detail;LLE deficits/detail;Generalized weakness RLE Deficits / Details: pt with compression dressing on lower leg, but puffy edema on top of foot and toes is visible.  Ankle lacks dorsiflexion to neutral Strength ~ 2/5 LLE Deficits / Details: pt with compression dressing on lower leg, but puffy edema on top of foot and toes is visible. Entire LLE is bigger thank RLE, but pt not able to state how long it has been like this. Ankle lacks dorsiflexion to neutral strenght ~2/5 Cervical / Trunk Assessment Cervical / Trunk Assessment: Other exceptions Cervical / Trunk Exceptions: generalized weakness and mucle atrophy   Balance Balance Balance Assessed: Yes Static Sitting Balance Static Sitting - Balance Support: Bilateral upper extremity supported Static Sitting - Level of Assistance: 5: Stand by assistance Static Sitting - Comment/# of Minutes: less than one minute  End of Session PT - End of Session Equipment Utilized During Treatment: Oxygen Activity Tolerance: Treatment limited secondary to medical complications (Comment) (dyspnea, fatigue) Patient left: in bed  GP    Rosey Bath K. [place], [place] [phone] 12/17/2012, 4:34 PM

## 2012-12-17 NOTE — Clinical Social Work Psychosocial (Unsigned)
     Clinical Social Work Department BRIEF PSYCHOSOCIAL ASSESSMENT 12/17/2012  Patient:  Nicole Roberts, Nicole Roberts     Account Number:  [id number]     Admit date:  12/13/2012  Clinical Social Worker:  Hattie Perch  Date/Time:  12/17/2012 12:00 M  Referred by:  Physician  Date Referred:  12/17/2012 Referred for  SNF Placement   Other Referral:   Interview type:  Family Other interview type:    PSYCHOSOCIAL DATA Living Status:  FACILITY Admitted from facility:  [facility] Level of care:  Independent Living Primary support name:  Nicole Roberts Primary support relationship to patient:  CHILD, ADULT Degree of support available:   fair    CURRENT CONCERNS Current Concerns  Post-Acute Placement   Other Concerns:    SOCIAL WORK ASSESSMENT / PLAN CSW spoke with patient's son, arthur. He states that patient is from independent living but will likely need snf upon discharge. states that patient has previously been to [facility] home and he would be happy for her to go back there if she could go on a different unit than she did then. if not, he would like csw to find another facility   Assessment/plan status:   Other assessment/ plan:   Information/referral to community resources:    PATIENTS/FAMILYS RESPONSE TO PLAN OF CARE: patient son is agreeable to snf. would be happy with a new wing at [facility] or a different choice of facility.

## 2012-12-18 ENCOUNTER — Encounter (HOSPITAL_COMMUNITY): Payer: Self-pay | Admitting: *Deleted

## 2012-12-18 LAB — BASIC METABOLIC PANEL
Chloride: 108 mEq/L (ref 96–112)
GFR calc Af Amer: 34 mL/min — ABNORMAL LOW (ref >90)
Potassium: 4.9 mEq/L (ref 3.5–5.1)
Sodium: 140 mEq/L (ref 135–145)

## 2012-12-18 LAB — CBC
Platelets: 94 10*3/uL — ABNORMAL LOW (ref 150–400)
RDW: 18.8 % — ABNORMAL HIGH (ref 11.5–15.5)
WBC: 7.3 10*3/uL (ref 4.0–10.5)

## 2012-12-18 LAB — URINE CULTURE: Culture: NO GROWTH

## 2012-12-18 NOTE — Progress Notes (Signed)
Notified MD Rama regarding patient's urine output for the shift is about 250 ml, awaiting callback Stanford Breed RN 12-18-2012 18:42pm

## 2012-12-18 NOTE — Progress Notes (Signed)
Physical Therapy Treatment Patient Details Name: Nicole Roberts MRN: [id number] DOB: 1922-01-10 Today's Date: 12/18/2012 Time: 1535-1550 PT Time Calculation (min): 15 min  PT Assessment / Plan / Recommendation  History of Present Illness Pt continues with edema in feet    PT Comments   Pt with slow progress, but better able to sit on edge of bed today.. She is limited in standing by pain and edema in feet  Follow Up Recommendations  SNF     Does the patient have the potential to tolerate intense rehabilitation     Barriers to Discharge        Equipment Recommendations       Recommendations for Other Services    Frequency     Progress towards PT Goals Progress towards PT goals: Progressing toward goals  Plan Current plan remains appropriate    Precautions / Restrictions Precautions Precautions: Fall   Pertinent Vitals/Pain Pain in feet with attempts to weight bear    Mobility  Bed Mobility Bed Mobility: Supine to Sit;Sit to Supine Rolling Left: Patient Percentage: 50% Supine to Sit: HOB elevated;With rails;3: Mod assist Details for Bed Mobility Assistance: Flat spin techique used to bring pt to edge of bed.  O2 was maintained.  pt was able to find her balance point and sit for several minutes.  Transfers Transfers: Editor, commissioning Transfers: 1: +2 Total assist Stand Pivot Transfers: Patient Percentage: 30% Details for Transfer Assistance: pt with c/o pain in feet when trying to weight bear through them to get to chair.  she was unable/unwilling to try to stand again once she was in the chair Maximove pad placed in chair for nursing to return pt to bed Ambulation/Gait Ambulation/Gait Assistance: Not tested (comment)    Exercises General Exercises - Lower Extremity Ankle Circles/Pumps: AAROM;PROM;Both;15 reps;Supine Long Arc Quad: AROM;Both;5 reps;Seated Hip ABduction/ADduction: AAROM;Both;10 reps;Supine Hip Flexion/Marching: AAROM;Both;10  reps;Supine   PT Diagnosis:    PT Problem List:   PT Treatment Interventions:     PT Goals (current goals can now be found in the care plan section)    Visit Information  Last PT Received On: 12/18/12 History of Present Illness: Pt continues with edema in feet     Subjective Data      Cognition  Cognition Arousal/Alertness: Awake/alert Behavior During Therapy: WFL for tasks assessed/performed Overall Cognitive Status: No family/caregiver present to determine baseline cognitive functioning Memory: Decreased recall of precautions;Decreased short-term memory (pt not a good historian)    Balance  Balance Balance Assessed: Yes Static Sitting Balance Static Sitting - Balance Support: Bilateral upper extremity supported Static Sitting - Level of Assistance: 5: Stand by assistance Static Sitting - Comment/# of Minutes: ~ 5 minutes  End of Session PT - End of Session Equipment Utilized During Treatment: Oxygen Activity Tolerance: Patient tolerated treatment well Nurse Communication: Need for lift equipment   GP    Ammi Hutt K. [place], [place] [phone] 12/18/2012, 4:20 PM

## 2012-12-18 NOTE — Progress Notes (Signed)
[facility] PROGRESS NOTE  Nicole Roberts ZOX:[id number] DOB: [birth year] DOA: 12/13/2012 PCP: [name], MD  Brief narrative: Nicole Roberts is an 79 y.o. female with a PMH of atrial fibrillation, sinus syndrome status post permanent pacemaker in 2007, breast cancer status post lumpectomy, hypertension, hyperlipidemia, hypothyroidism and pulmonary hypertension who was recently treated for cellulitis. A review of her records reveals that she was hospitalized 09/16/2012-09/21/2012 for treatment of cellulitis and was ultimately discharged to a skilled nursing facility. She was also noted to have diarrhea during that hospitalization. Clostridium difficile studies were negative and she completed a course of 5 days of IV Zosyn and vancomycin. Doppler studies were negative for DVT. She was discharged home on an oral course of doxycycline and Cipro. She has also been under the care of gastroenterology for evaluation of weight loss and GI symptoms including intermittent nausea/vomiting/diarrhea. She underwent an EGD on 10/18/2012 which showed esophagitis for which she was treated with Carafate and PPI therapy. She presented to the ER on 11/24/2012 with recurrent cellulitis for which she was given an IV dose of Cipro and sent home on oral doxycycline. She returned to the emergency department on 12/13/2012 with fever and ongoing complaints of left lower extremity erythema and tenderness. Upon admission, the patient was noted to be persistently hypotensive despite IV fluids and was transferred to the ICU and placed on pressor support.  Assessment/Plan: Principal Problem:   Severe sepsis with septic shock secondary to left lower extremity cellulitis in the setting of chronic venous stasis/edema  -Given fever and no other obvious source for this (urinalysis clear, chest x-ray negative for pneumonia), she is currently being treated for recurrent cellulitis with empiric vancomycin and Zosyn.  -Moved to the ICU  on 12/13/2012 secondary to persistent hypotension and elevated serum lactate necessitating pressor support. Lactic acid now within normal limits.  Moved out of the ICU 12/16/12. -Blood cultures x2 pending.Pro-calcitonin 7.45--->5.64--->3.14. -Unna boots applied to lower extremities.  -PT evaluation. Active Problems: Thrombocytopenia -May be secondary to sepsis. Not on Lovenox but on Pradaxa. Watch for signs of bleeding. HYPOTHYROIDISM  -Continue home dose of Synthroid.  HYPERLIPIDEMIA  -Continue home dose of statin therapy.  Acute on chronic systolic CHF  -Last two-dimensional echocardiogram done 04/03/2012. EF 55-65%. No regional wall motion abnormalities. No mention of diastolic dysfunction.  -Ppro BNP markedly elevated at 7685. Diuretics on hold secondary to hypotension. No complaints of dyspnea. No evidence of clinically decompensated heart failure. -2-D echocardiogram done: EF 45% with akinesis of the mid-distal anteroseptal and apical myocardium, the study was not technically sufficient to allow evaluation of LV diastolic function. Macrocytic anemia  -Continue Folvite. 2-3 g drop in hemoglobin since admission likely reflective of dilutional factors, but watch for signs of bleeding given chronic anticoagulation. -Fecal occult blood testing was negative Atrial fibrillation  -On chronic anticoagulation with Pradaxa.  -Developed rapid ventricular response on 12/15/2012, responded to one dose of IV metoprolol and resumption of usual home dose of Toprol-XL. Stage III CKD (chronic kidney disease)  -Baseline creatinine variable but GFR ranges from 31-44. Current creatinine slightly above usual baseline values, likely reflective of hypotension. Continue to monitor. Depression  -Continue Celexa. Note: Dosage reduced to 20 mg daily per pharmacy recommendations to avoid prolonged QTC in patients greater than age 77. Hypokalemia  -Potassium corrected with increase KCL supplementation to BID.   Rheumatoid arthritis  -Hold Arava in the setting of acute infection.  -On stress dose steroids given chronic treatment with prednisone 5 mg daily.  Code Status:  Full.  Family Communication: Son updated by Maren Reamer, NP 12/13/12. Left message for him (Art at [phone]) 12/14/12. Disposition Plan: Back to ALF when stable.    Medical Consultants:  Dr. Cyril Mourning, Critical care  Other Consultants:  None.  Anti-infectives:  Vancomycin 12/13/2012--->  Zosyn 12/13/2012--->  HPI/Subjective: Nicole Roberts continues to feel well, overall. No fevers. She is fatigued and her appetite is still fair. Denies dyspnea, chest pressure and chest pain.   Complains of mouth soreness, but this is slightly improved since starting magic mouthwash.  Objective: Filed Vitals:   12/17/12 0547 12/17/12 1314 12/17/12 2132 12/18/12 0657  BP: 123/80 119/80 122/82 131/81  Pulse: 75 61 60 61  Temp: 97.4 F (36.3 C) 97.8 F (36.6 C) 98.2 F (36.8 C) 97.4 F (36.3 C)  TempSrc: Oral Oral Axillary Oral  Resp: 20 18 16 16   Height:      Weight:      SpO2: 98% 100% 100% 99%    Intake/Output Summary (Last 24 hours) at 12/18/12 0815 Last data filed at 12/18/12 4540  Gross per 24 hour  Intake   2640 ml  Output    501 ml  Net   2139 ml    Exam: Gen:  NAD Cardiovascular:  RRR, No M/R/G Respiratory:  Lungs CTAB Gastrointestinal:  Abdomen soft, NT/ND, + BS Extremities:  Unna boots in place, + edema  Data Reviewed: Basic Metabolic Panel:  Recent Labs Lab 12/14/12 0540 12/15/12 0500 12/16/12 0500 12/17/12 0405 12/18/12 0405  NA 137 137 137 139 140  K 4.0 3.5 4.2 4.5 4.9  CL 105 105 105 108 108  CO2 21 21 19 21 20   GLUCOSE 123* 100* 107* 104* 93  BUN 28* 32* 36* 39* 45*  CREATININE 1.40* 1.43* 1.41* 1.44* 1.52*  CALCIUM 7.6* 7.5* 8.1* 8.1* 8.5   GFR Estimated Creatinine Clearance: 24.2 ml/min (by C-G formula based on Cr of 1.52). Liver Function Tests:  Recent Labs Lab 12/13/12 0600   AST 20  ALT 18  ALKPHOS 59  BILITOT 0.5  PROT 6.2  ALBUMIN 3.2*   CBC:  Recent Labs Lab 12/13/12 0600  12/13/12 2233 12/14/12 0540 12/15/12 0500 12/16/12 0500 12/17/12 0405 12/18/12 0405  WBC 11.0*  --  17.4* 18.6* 13.9* 14.2* 9.7 7.3  NEUTROABS 9.7*  --  15.9*  --   --   --   --   --   HGB 10.2*  < > 8.0* 8.2* 7.4* 8.1* 7.9* 7.9*  HCT 31.4*  < > 23.8* 24.2* 21.8* 24.0* 24.1* 24.2*  MCV 107.2*  --  105.8* 106.1* 105.3* 105.3* 106.6* 108.0*  PLT 168  --  150 152 101* 116* 103* 94*  < > = values in this interval not displayed. BNP (last 3 results)  Recent Labs  03/19/12 1009 12/13/12 0600  PROBNP 4164.0* 7685.0*   CBG:  Recent Labs Lab 12/13/12 0651  GLUCAP 70   Sepsis Markers:  Ref. Range 12/14/2012 06:15 12/14/2012 10:46 12/15/2012 05:00 12/15/2012 08:00 12/16/2012 05:00  Procalcitonin No range found   5.64  3.14    Ref. Range 12/13/2012 07:16 12/13/2012 22:33 12/14/2012 05:40  Lactic Acid, Venous Latest Range: 0.5-2.2 mmol/L 3.71 (H) 2.2     Microbiology Recent Results (from the past 240 hour(s))  CULTURE, BLOOD (ROUTINE X 2)     Status: None   Collection Time    12/13/12  8:35 AM      Result Value Range Status   Specimen Description BLOOD RIGHT ANTECUBITAL  Final   Special Requests BOTTLES DRAWN AEROBIC ONLY 4CC   Final   Culture  Setup Time     Final   Value: 12/13/2012 10:22     Performed at [facility]   Culture     Final   Value:        BLOOD CULTURE RECEIVED NO GROWTH TO DATE CULTURE WILL BE HELD FOR 5 DAYS BEFORE ISSUING A FINAL NEGATIVE REPORT     Performed at [facility]   Report Status PENDING   Incomplete  CULTURE, BLOOD (ROUTINE X 2)     Status: None   Collection Time    12/13/12  9:00 AM      Result Value Range Status   Specimen Description BLOOD RIGHT ANTECUBITAL   Final   Special Requests BOTTLES DRAWN AEROBIC AND ANAEROBIC 5CC   Final   Culture  Setup Time     Final   Value: 12/13/2012 10:22     Performed at [facility]   Culture     Final   Value:        BLOOD CULTURE RECEIVED NO GROWTH TO DATE CULTURE WILL BE HELD FOR 5 DAYS BEFORE ISSUING A FINAL NEGATIVE REPORT     Performed at [facility]   Report Status PENDING   Incomplete  MRSA PCR SCREENING     Status: None   Collection Time    12/13/12 11:09 AM      Result Value Range Status   MRSA by PCR NEGATIVE  NEGATIVE Final   Comment:            The GeneXpert MRSA Assay (FDA     approved for NASAL specimens     only), is one component of a     comprehensive MRSA colonization     surveillance program. It is not     intended to diagnose MRSA     infection nor to guide or     monitor treatment for     MRSA infections.  MRSA PCR SCREENING     Status: None   Collection Time    12/13/12  7:59 PM      Result Value Range Status   MRSA by PCR NEGATIVE  NEGATIVE Final   Comment:            The GeneXpert MRSA Assay (FDA     approved for NASAL specimens     only), is one component of a     comprehensive MRSA colonization     surveillance program. It is not     intended to diagnose MRSA     infection nor to guide or     monitor treatment for     MRSA infections.     Procedures and Diagnostic Studies: Dg Chest 2 View  12/13/2012   *RADIOLOGY REPORT*  Clinical Data: Fever  CHEST - 2 VIEW  Comparison:  Study obtained earlier in the day and March 19, 2012  Findings:  There is a degree of underlying emphysematous change. There is mild scarring bilaterally.  There is no edema or consolidation. Heart is mildly enlarged with normal pulmonary vascularity.  Pacemaker leads are attached to the right atrium and right ventricle.  There is degenerative change in the thoracic spine.  No adenopathy.  IMPRESSION: Underlying emphysematous change.  No edema or consolidation.   Original Report Authenticated By: Bretta Bang, M.D.   Dg Chest Portable 1 View  12/13/2012   *RADIOLOGY REPORT*  Clinical Data: Bodyaches.  Shortness of breath.   Weakness.  PORTABLE CHEST - 1 VIEW  Comparison: Chest x-ray 03/19/2012.  Findings: Mild diffuse peribronchial cuffing.  No acute consolidative airspace disease.  No pleural effusions.  Lung volumes are normal.  No evidence of pulmonary edema.  Heart size cm borderline enlarged. The patient is rotated to the left on today's exam, resulting in distortion of the mediastinal contours and reduced diagnostic sensitivity and specificity for mediastinal pathology.  Atherosclerosis in the thoracic aorta.  Left-sided pacemaker device in place with lead tips projecting over the expected location of the right atrium and right ventricular apex.  IMPRESSION: 1.  Mild diffuse peribronchial cuffing may suggest bronchitis. 2.  Borderline cardiomegaly. 3.  Atherosclerosis.   Original Report Authenticated By: Trudie Reed, M.D.    Scheduled Meds: . atorvastatin  10 mg Oral QHS  . calcium-vitamin D  1 tablet Oral BID WC  . citalopram  20 mg Oral Daily  . cyanocobalamin  500 mcg Oral Daily  . dabigatran  75 mg Oral Q12H  . feeding supplement  237 mL Oral BID BM  . fentaNYL  100 mcg Transdermal Q72H  . folic acid  0.5 mg Oral Daily  . gabapentin  100 mg Oral BID WC  . gabapentin  400 mg Oral QHS  . hydrocortisone sod succinate (SOLU-CORTEF) inj  50 mg Intravenous Q6H  . isosorbide mononitrate  15 mg Oral Daily  . levothyroxine  25 mcg Oral Q0600  . magic mouthwash w/lidocaine  5 mL Oral QID  . metoprolol succinate  50 mg Oral BID  . pantoprazole  40 mg Oral Daily  . piperacillin-tazobactam (ZOSYN)  IV  3.375 g Intravenous Q8H  . potassium chloride SA  20 mEq Oral BID  . sodium chloride  3 mL Intravenous Q12H  . sucralfate  1 g Oral TID WC & HS  . vancomycin  500 mg Intravenous Q24H   Continuous Infusions: . sodium chloride 100 mL/hr at 12/18/12 0600    Time spent: 25 minutes.   LOS: 5 days   RAMA,CHRISTINA  [facility] Pager [phone].   *Please note that the hospitalists switch teams on  Wednesdays. Please call the flow manager at [phone] if you are having difficulty reaching the hospitalist taking care of this patient as she can update you and provide the most up-to-date pager number of provider caring for the patient. If 8PM-8AM, please contact night-coverage at [email or web address], password [facility]  12/18/2012, 8:15 AM

## 2012-12-18 NOTE — Progress Notes (Signed)
Spoke to MD Rama regarding patient's urine output order received to bladder scan patient, patient still confused md stated that labs with be checked in the morning, will continue to monitor patient Stanford Breed RN 18:55pm 12-18-2012

## 2012-12-18 NOTE — Progress Notes (Signed)
Notified MD Rama that patient seems confused and anxious, patient is alert to person and place confused on the year says its 12-18-1922 and talking about people chasing her, patient complaining of generalized pain all over and after speaking with MD patient was given 1 tab of norco for pain, patient is up in chair, vital signs are stable, will continue to monitor patient and notify MD of any further changes Stanford Breed RN 12-18-2012 17:32pm

## 2012-12-18 NOTE — Progress Notes (Signed)
CSW met with pt's son this am to assist with d/c planning. SNF bed offers provided. Pt has been to [facility] in the past and son would like her to return for rehab following hospital d/c. Mr Deveney prefers to discuss rehab placement with his mother. CSW will provide support as needed and continue to assist with d/c planning.  Cori Razor LCSW [phone]

## 2012-12-19 ENCOUNTER — Inpatient Hospital Stay (HOSPITAL_COMMUNITY): Payer: Medicare Other

## 2012-12-19 LAB — CBC
HCT: 30.5 % — ABNORMAL LOW (ref 36.0–46.0)
MCH: 34.6 pg — ABNORMAL HIGH (ref 26.0–34.0)
MCHC: 32.1 g/dL (ref 30.0–36.0)
RDW: 18.6 % — ABNORMAL HIGH (ref 11.5–15.5)

## 2012-12-19 LAB — CULTURE, BLOOD (ROUTINE X 2)
Culture: NO GROWTH
Culture: NO GROWTH

## 2012-12-19 LAB — BASIC METABOLIC PANEL
BUN: 56 mg/dL — ABNORMAL HIGH (ref 6–23)
Calcium: 8.8 mg/dL (ref 8.4–10.5)
Creatinine, Ser: 1.75 mg/dL — ABNORMAL HIGH (ref 0.50–1.10)
GFR calc Af Amer: 29 mL/min — ABNORMAL LOW (ref >90)
GFR calc non Af Amer: 25 mL/min — ABNORMAL LOW (ref >90)
Glucose, Bld: 102 mg/dL — ABNORMAL HIGH (ref 70–99)
Potassium: 5.8 mEq/L — ABNORMAL HIGH (ref 3.5–5.1)

## 2012-12-19 LAB — CREATININE, SERUM
Creatinine, Ser: 1.74 mg/dL — ABNORMAL HIGH (ref 0.50–1.10)
GFR calc Af Amer: 29 mL/min — ABNORMAL LOW (ref >90)

## 2012-12-19 MED ORDER — FUROSEMIDE 10 MG/ML IJ SOLN
40.0000 mg | Freq: Once | INTRAMUSCULAR | Status: AC
  Administered 2012-12-19: 40 mg via INTRAVENOUS
  Filled 2012-12-19: qty 4

## 2012-12-19 MED ORDER — HYDROCORTISONE SOD SUCCINATE 100 MG IJ SOLR
50.0000 mg | Freq: Two times a day (BID) | INTRAMUSCULAR | Status: DC
  Administered 2012-12-19 – 2012-12-21 (×4): 50 mg via INTRAVENOUS
  Filled 2012-12-19 (×6): qty 1

## 2012-12-19 MED ORDER — SODIUM CHLORIDE 0.9 % IV SOLN: 250.0000 mL | INTRAVENOUS | Status: DC | PRN

## 2012-12-19 MED ORDER — LISINOPRIL 5 MG PO TABS
5.0000 mg | ORAL_TABLET | Freq: Every day | ORAL | Status: DC
  Administered 2012-12-19: 5 mg via ORAL
  Filled 2012-12-19: qty 1

## 2012-12-19 NOTE — Progress Notes (Signed)
[facility] PROGRESS NOTE  Nicole Roberts ZOX:[id number] DOB: 14-Nov-1921 DOA: 12/13/2012 PCP: [name], MD  Brief narrative: Nicole Roberts is an 79 y.o. female with a PMH of atrial fibrillation, sinus syndrome status post permanent pacemaker in 2007, breast cancer status post lumpectomy, hypertension, hyperlipidemia, hypothyroidism and pulmonary hypertension who was recently treated for cellulitis. A review of her records reveals that she was hospitalized 09/16/2012-09/21/2012 for treatment of cellulitis and was ultimately discharged to a skilled nursing facility. She was also noted to have diarrhea during that hospitalization. Clostridium difficile studies were negative and she completed a course of 5 days of IV Zosyn and vancomycin. Doppler studies were negative for DVT. She was discharged home on an oral course of doxycycline and Cipro. She has also been under the care of gastroenterology for evaluation of weight loss and GI symptoms including intermittent nausea/vomiting/diarrhea. She underwent an EGD on 10/18/2012 which showed esophagitis for which she was treated with Carafate and PPI therapy. She presented to the ER on 11/24/2012 with recurrent cellulitis for which she was given an IV dose of Cipro and sent home on oral doxycycline. She returned to the emergency department on 12/13/2012 with fever and ongoing complaints of left lower extremity erythema and tenderness. Upon admission, the patient was noted to be persistently hypotensive despite IV fluids and was transferred to the ICU and placed on pressor support. She subsequently improved and was transferred to the floor where she did well up until 12/19/2012 when she became increasingly confused and weak. This time, she likely has an element of decompensated heart failure given her massive volume overload from aggressive IV fluids needed while she was septic and hypotensive. Cautious diuresis initiated.  Assessment/Plan: Principal  Problem:   Severe sepsis with septic shock secondary to left lower extremity cellulitis in the setting of chronic venous stasis/edema  -Given fever and no other obvious source for this (urinalysis clear, chest x-ray negative for pneumonia), she is currently being treated for recurrent cellulitis with empiric vancomycin and Zosyn.  -Moved to the ICU on 12/13/2012 secondary to persistent hypotension and elevated serum lactate necessitating pressor support. Lactic acid now within normal limits.  Moved out of the ICU 12/16/12. -Blood cultures x2 pending.Pro-calcitonin 7.45--->5.64--->3.14. -Unna boots applied to lower extremities.  -PT evaluation: SNF recommended. Active Problems: Thrombocytopenia -May be secondary to sepsis. Not on Lovenox but on Pradaxa. Watch for signs of bleeding. HYPOTHYROIDISM  -Continue home dose of Synthroid.  HYPERLIPIDEMIA  -Continue home dose of statin therapy.  Acute on chronic systolic CHF  -Last two-dimensional echocardiogram done 04/03/2012. EF 55-65%. No regional wall motion abnormalities. No mention of diastolic dysfunction.  -Ppro BNP markedly elevated at 7685. Diuretics initially on hold secondary to hypotension. No complaints of dyspnea. Chest x-ray done 12/19/2012 show some pleural effusions but no frank pulmonary edema. -2-D echocardiogram done: EF 45% with akinesis of the mid-distal anteroseptal and apical myocardium, the study was not technically sufficient to allow evaluation of LV diastolic function. -Appears massively volume overloaded on exam with positive jugular reflux. Will initiate cautious diuresis Lasix 40 mg IV x1 and close followup of renal function. Macrocytic anemia  -Continue Folvite. 2-3 g drop in hemoglobin since admission likely reflective of dilutional factors, but watch for signs of bleeding given chronic anticoagulation. -Fecal occult blood testing was negative Atrial fibrillation  -On chronic anticoagulation with Pradaxa.  -Developed  rapid ventricular response on 12/15/2012, responded to one dose of IV metoprolol and resumption of usual home dose of Toprol-XL. Stage III CKD (chronic  kidney disease)  -Baseline creatinine variable but GFR ranges from 31-44. Current creatinine slightly above usual baseline values, likely reflective of hypotension. Continue to monitor. Depression  -Continue Celexa. Note: Dosage reduced to 20 mg daily per pharmacy recommendations to avoid prolonged QTC in patients greater than age 28. Hypokalemia  -Potassium corrected with increase KCL supplementation to BID.  Rheumatoid arthritis  -Hold Arava in the setting of acute infection.  -On stress dose steroids given chronic treatment with prednisone 5 mg daily.  Code Status: Full.  Family Communication: Son updated at bedside (Art at [phone])  Disposition Plan: SNF.    Medical Consultants:  Dr. Cyril Mourning, Critical care  Other Consultants:  None.  Anti-infectives:  Vancomycin 12/13/2012--->  Zosyn 12/13/2012--->  HPI/Subjective: Nicole Roberts has deteriorated over the past 24 hours. She became disoriented last evening, and she continues to have some evidence of delirium with an altered sensorium and she is sleeping more with increased lethargy. She denies chest pain or dyspnea on direct questioning, but I suspect that she is not able to fully appreciate her condition.  Objective: Filed Vitals:   12/18/12 1729 12/18/12 1952 12/18/12 2206 12/19/12 0544  BP: 126/80 142/71 115/77 128/84  Pulse: 60 80 81 61  Temp: 97.5 F (36.4 C) 98.1 F (36.7 C) 97.4 F (36.3 C) 97.5 F (36.4 C)  TempSrc: Axillary Rectal Oral Oral  Resp: 18 16 16 18   Height:      Weight:      SpO2: 95% 96% 95% 100%    Intake/Output Summary (Last 24 hours) at 12/19/12 1404 Last data filed at 12/19/12 1130  Gross per 24 hour  Intake   2780 ml  Output    626 ml  Net   2154 ml    Exam: Gen:  NAD Cardiovascular:  RRR, No M/R/G, positive hepatojugular  reflux and JVD Respiratory:  Lungs CTAB Gastrointestinal:  Abdomen soft, NT/ND, + BS Extremities:  Unna boots in place, + massive edema  Data Reviewed: Basic Metabolic Panel:  Recent Labs Lab 12/15/12 0500 12/16/12 0500 12/17/12 0405 12/18/12 0405 12/19/12 0408 12/19/12 0820  NA 137 137 139 140  --  137  K 3.5 4.2 4.5 4.9  --  5.8*  CL 105 105 108 108  --  106  CO2 21 19 21 20   --  15*  GLUCOSE 100* 107* 104* 93  --  102*  BUN 32* 36* 39* 45*  --  56*  CREATININE 1.43* 1.41* 1.44* 1.52* 1.74* 1.75*  CALCIUM 7.5* 8.1* 8.1* 8.5  --  8.8   GFR Estimated Creatinine Clearance: 21 ml/min (by C-G formula based on Cr of 1.75). Liver Function Tests:  Recent Labs Lab 12/13/12 0600  AST 20  ALT 18  ALKPHOS 59  BILITOT 0.5  PROT 6.2  ALBUMIN 3.2*   CBC:  Recent Labs Lab 12/13/12 0600  12/13/12 2233  12/15/12 0500 12/16/12 0500 12/17/12 0405 12/18/12 0405 12/19/12 0820  WBC 11.0*  --  17.4*  < > 13.9* 14.2* 9.7 7.3 7.8  NEUTROABS 9.7*  --  15.9*  --   --   --   --   --   --   HGB 10.2*  < > 8.0*  < > 7.4* 8.1* 7.9* 7.9* 9.8*  HCT 31.4*  < > 23.8*  < > 21.8* 24.0* 24.1* 24.2* 30.5*  MCV 107.2*  --  105.8*  < > 105.3* 105.3* 106.6* 108.0* 107.8*  PLT 168  --  150  < > 101*  116* 103* 94* 121*  < > = values in this interval not displayed. BNP (last 3 results)  Recent Labs  03/19/12 1009 12/13/12 0600  PROBNP 4164.0* 7685.0*   CBG:  Recent Labs Lab 12/13/12 0651  GLUCAP 70   Sepsis Markers:  Ref. Range 12/14/2012 06:15 12/14/2012 10:46 12/15/2012 05:00 12/15/2012 08:00 12/16/2012 05:00  Procalcitonin No range found   5.64  3.14    Ref. Range 12/13/2012 07:16 12/13/2012 22:33 12/14/2012 05:40  Lactic Acid, Venous Latest Range: 0.5-2.2 mmol/L 3.71 (H) 2.2     Microbiology Recent Results (from the past 240 hour(s))  CULTURE, BLOOD (ROUTINE X 2)     Status: None   Collection Time    12/13/12  8:35 AM      Result Value Range Status   Specimen Description BLOOD  RIGHT ANTECUBITAL   Final   Special Requests BOTTLES DRAWN AEROBIC ONLY 4CC   Final   Culture  Setup Time     Final   Value: 12/13/2012 10:22     Performed at [facility]   Culture     Final   Value: NO GROWTH 5 DAYS     Performed at [facility]   Report Status 12/19/2012 FINAL   Final  CULTURE, BLOOD (ROUTINE X 2)     Status: None   Collection Time    12/13/12  9:00 AM      Result Value Range Status   Specimen Description BLOOD RIGHT ANTECUBITAL   Final   Special Requests BOTTLES DRAWN AEROBIC AND ANAEROBIC 5CC   Final   Culture  Setup Time     Final   Value: 12/13/2012 10:22     Performed at [facility]   Culture     Final   Value: NO GROWTH 5 DAYS     Performed at [facility]   Report Status 12/19/2012 FINAL   Final  MRSA PCR SCREENING     Status: None   Collection Time    12/13/12 11:09 AM      Result Value Range Status   MRSA by PCR NEGATIVE  NEGATIVE Final   Comment:            The GeneXpert MRSA Assay (FDA     approved for NASAL specimens     only), is one component of a     comprehensive MRSA colonization     surveillance program. It is not     intended to diagnose MRSA     infection nor to guide or     monitor treatment for     MRSA infections.  MRSA PCR SCREENING     Status: None   Collection Time    12/13/12  7:59 PM      Result Value Range Status   MRSA by PCR NEGATIVE  NEGATIVE Final   Comment:            The GeneXpert MRSA Assay (FDA     approved for NASAL specimens     only), is one component of a     comprehensive MRSA colonization     surveillance program. It is not     intended to diagnose MRSA     infection nor to guide or     monitor treatment for     MRSA infections.  URINE CULTURE     Status: None   Collection Time    12/17/12 12:35 PM      Result Value Range Status  Specimen Description URINE, CLEAN CATCH   Final   Special Requests NONE   Final   Culture  Setup Time     Final   Value: 12/17/2012  21:56     Performed at [facility] Count     Final   Value: NO GROWTH     Performed at [facility]   Culture     Final   Value: NO GROWTH     Performed at [facility]   Report Status 12/18/2012 FINAL   Final     Procedures and Diagnostic Studies: Dg Chest 2 View  12/13/2012   *RADIOLOGY REPORT*  Clinical Data: Fever  CHEST - 2 VIEW  Comparison:  Study obtained earlier in the day and March 19, 2012  Findings:  There is a degree of underlying emphysematous change. There is mild scarring bilaterally.  There is no edema or consolidation. Heart is mildly enlarged with normal pulmonary vascularity.  Pacemaker leads are attached to the right atrium and right ventricle.  There is degenerative change in the thoracic spine.  No adenopathy.  IMPRESSION: Underlying emphysematous change.  No edema or consolidation.   Original Report Authenticated By: Bretta Bang, M.D.   Dg Chest Portable 1 View  12/13/2012   *RADIOLOGY REPORT*  Clinical Data: Bodyaches.  Shortness of breath.  Weakness.  PORTABLE CHEST - 1 VIEW  Comparison: Chest x-ray 03/19/2012.  Findings: Mild diffuse peribronchial cuffing.  No acute consolidative airspace disease.  No pleural effusions.  Lung volumes are normal.  No evidence of pulmonary edema.  Heart size cm borderline enlarged. The patient is rotated to the left on today's exam, resulting in distortion of the mediastinal contours and reduced diagnostic sensitivity and specificity for mediastinal pathology.  Atherosclerosis in the thoracic aorta.  Left-sided pacemaker device in place with lead tips projecting over the expected location of the right atrium and right ventricular apex.  IMPRESSION: 1.  Mild diffuse peribronchial cuffing may suggest bronchitis. 2.  Borderline cardiomegaly. 3.  Atherosclerosis.   Original Report Authenticated By: Trudie Reed, M.D.    Scheduled Meds: . atorvastatin  10 mg Oral QHS  . calcium-vitamin D  1 tablet  Oral BID WC  . citalopram  20 mg Oral Daily  . cyanocobalamin  500 mcg Oral Daily  . dabigatran  75 mg Oral Q12H  . feeding supplement  237 mL Oral BID BM  . fentaNYL  100 mcg Transdermal Q72H  . folic acid  0.5 mg Oral Daily  . gabapentin  100 mg Oral BID WC  . hydrocortisone sod succinate (SOLU-CORTEF) inj  50 mg Intravenous Q12H  . isosorbide mononitrate  15 mg Oral Daily  . levothyroxine  25 mcg Oral Q0600  . lisinopril  5 mg Oral Daily  . magic mouthwash w/lidocaine  5 mL Oral QID  . metoprolol succinate  50 mg Oral BID  . pantoprazole  40 mg Oral Daily  . piperacillin-tazobactam (ZOSYN)  IV  3.375 g Intravenous Q8H  . potassium chloride SA  20 mEq Oral BID  . sodium chloride  3 mL Intravenous Q12H  . sucralfate  1 g Oral TID WC & HS  . vancomycin  500 mg Intravenous Q24H   Continuous Infusions:    Time spent: 35 minutes with greater than 50% time spent discussing clinical deterioration with son including current diagnostic test results, clinical impression and plan of care.   LOS: 6 days   Eisley Barber  [facility] Pager [phone].   *  Please note that the hospitalists switch teams on Wednesdays. Please call the flow manager at [phone] if you are having difficulty reaching the hospitalist taking care of this patient as she can update you and provide the most up-to-date pager number of provider caring for the patient. If 8PM-8AM, please contact night-coverage at [email or web address], password [facility]  12/19/2012, 2:04 PM

## 2012-12-19 NOTE — Progress Notes (Signed)
Physical Therapy Treatment Patient Details Name: Nicole Roberts MRN: [id number] DOB: [birth year] Today's Date: 12/19/2012 Time: 8657-8469 PT Time Calculation (min): 27 min  PT Assessment / Plan / Recommendation  History of Present Illness     PT Comments   Pt in bed on 2.5 lts O2 and noticeably edematous throughout.  In/out alertness and slow to respond.  Pt repeated that she just didn't feel good.  Son arrived in room and was very concerned about his mother's confusion and poor appetite. Was able to sit pt EOB x 7 min however required total assist + 2 to transition with pt offering 0%.  Once upright, pt still required total assist as she was unable to support self.  B UE's very edematous and pt was unable to lift or use them.  Worked on trunk stability and sitting tolerance.  Unable to attempt any OOB activity, returned pt to supine position.    Follow Up Recommendations  SNF     Does the patient have the potential to tolerate intense rehabilitation     Barriers to Discharge        Equipment Recommendations       Recommendations for Other Services    Frequency Min 3X/week   Progress towards PT Goals Progress towards PT goals: Progressing toward goals  Plan      Precautions / Restrictions Precautions Precautions: Fall    Pertinent Vitals/Pain No c/o pain    Mobility  Bed Mobility Bed Mobility: Supine to Sit;Sit to Supine Supine to Sit: 1: +2 Total assist Supine to Sit: Patient Percentage: 0% Sit to Supine: 1: +2 Total assist Sit to Supine: Patient Percentage: 0% Details for Bed Mobility Assistance: HOB elevated 50 degrees with + 2 total assist and pad to swival hips to EOB with pt unable to assist due to increased edema throughout and increased weakness Transfers Details for Transfer Assistance: unable to attempt transfers due to low level with bed mobility     PT Goals (current goals can now be found in the care plan section)    Visit Information  Last PT Received  On: 12/19/12    Subjective Data      Cognition       Balance     End of Session PT - End of Session Equipment Utilized During Treatment: Oxygen Activity Tolerance: Patient limited by fatigue Patient left: in bed   Felecia Shelling  PTA [facility]  Acute  Rehab Pager      [phone]

## 2012-12-19 NOTE — Progress Notes (Signed)
Notified MD Rama of patient's concern that she doe's not feel well and feels like she is dying, patient will not explain why she feels that way, MD order labs this morning, vital are stable will continue to monitor patient Stanford Breed RN 12-19-2012 7:50am

## 2012-12-19 NOTE — Progress Notes (Signed)
CSW assisting with d/c planning. Spoke with Dr. Darnelle Catalan / PN reviewed. Unlikely pt will be ready for d/c Thurs. [facility] contacted and updated / PN sent. SNF bed may still be available over the next few days. Spoke to pt's son to update him on SNF bed status.  Cori Razor LCSW [phone]

## 2012-12-19 NOTE — Progress Notes (Signed)
ANTIBIOTIC CONSULT NOTE - follow-up  Pharmacy Consult for Vancomycin & Zosyn Indication: Sepsis secondary to LLE cellulitis in setting of chronic venous stasis  Allergies  Allergen Reactions  . Sulfa Antibiotics Itching  . Tramadol Hcl Itching and Rash   Patient Measurements: Height: 5' 4.17" (163 cm) (taken 10/15/12) Weight: 148 lb 6.4 oz (67.314 kg) (taken 11/28/12) IBW/kg (Calculated) : 55.1  Vital Signs:   Intake/Output from previous day: 08/19 0701 - 08/20 0700 In: 3230 [P.O.:480; I.V.:2200; IV Piggyback:550] Out: 676 [Urine:675; Stool:1] Intake/Output from this shift:    Labs:  Recent Labs  12/17/12 0405 12/18/12 0405 12/19/12 0408 12/19/12 0820  WBC 9.7 7.3  --  7.8  HGB 7.9* 7.9*  --  9.8*  PLT 103* 94*  --  121*  CREATININE 1.44* 1.52* 1.74* 1.75*   Estimated Creatinine Clearance: 21 ml/min (by C-G formula based on Cr of 1.75).  Recent Labs  12/17/12 0945 12/19/12 1938  VANCOTROUGH 20.2* 18.5     Microbiology: Recent Results (from the past 720 hour(s))  CULTURE, BLOOD (ROUTINE X 2)     Status: None   Collection Time    12/13/12  8:35 AM      Result Value Range Status   Specimen Description BLOOD RIGHT ANTECUBITAL   Final   Special Requests BOTTLES DRAWN AEROBIC ONLY 4CC   Final   Culture  Setup Time     Final   Value: 12/13/2012 10:22     Performed at [facility]   Culture     Final   Value: NO GROWTH 5 DAYS     Performed at [facility]   Report Status 12/19/2012 FINAL   Final  CULTURE, BLOOD (ROUTINE X 2)     Status: None   Collection Time    12/13/12  9:00 AM      Result Value Range Status   Specimen Description BLOOD RIGHT ANTECUBITAL   Final   Special Requests BOTTLES DRAWN AEROBIC AND ANAEROBIC 5CC   Final   Culture  Setup Time     Final   Value: 12/13/2012 10:22     Performed at [facility]   Culture     Final   Value: NO GROWTH 5 DAYS     Performed at [facility]   Report Status 12/19/2012 FINAL    Final  MRSA PCR SCREENING     Status: None   Collection Time    12/13/12 11:09 AM      Result Value Range Status   MRSA by PCR NEGATIVE  NEGATIVE Final   Comment:            The GeneXpert MRSA Assay (FDA     approved for NASAL specimens     only), is one component of a     comprehensive MRSA colonization     surveillance program. It is not     intended to diagnose MRSA     infection nor to guide or     monitor treatment for     MRSA infections.  MRSA PCR SCREENING     Status: None   Collection Time    12/13/12  7:59 PM      Result Value Range Status   MRSA by PCR NEGATIVE  NEGATIVE Final   Comment:            The GeneXpert MRSA Assay (FDA     approved for NASAL specimens     only), is one component of  a     comprehensive MRSA colonization     surveillance program. It is not     intended to diagnose MRSA     infection nor to guide or     monitor treatment for     MRSA infections.  URINE CULTURE     Status: None   Collection Time    12/17/12 12:35 PM      Result Value Range Status   Specimen Description URINE, CLEAN CATCH   Final   Special Requests NONE   Final   Culture  Setup Time     Final   Value: 12/17/2012 21:56     Performed at [facility] Count     Final   Value: NO GROWTH     Performed at [facility]   Culture     Final   Value: NO GROWTH     Performed at [facility]   Report Status 12/18/2012 FINAL   Final   Medications:  Scheduled:  . atorvastatin  10 mg Oral QHS  . calcium-vitamin D  1 tablet Oral BID WC  . citalopram  20 mg Oral Daily  . cyanocobalamin  500 mcg Oral Daily  . dabigatran  75 mg Oral Q12H  . feeding supplement  237 mL Oral BID BM  . fentaNYL  100 mcg Transdermal Q72H  . folic acid  0.5 mg Oral Daily  . gabapentin  100 mg Oral BID WC  . hydrocortisone sod succinate (SOLU-CORTEF) inj  50 mg Intravenous Q12H  . isosorbide mononitrate  15 mg Oral Daily  . levothyroxine  25 mcg Oral Q0600  . magic  mouthwash w/lidocaine  5 mL Oral QID  . metoprolol succinate  50 mg Oral BID  . pantoprazole  40 mg Oral Daily  . piperacillin-tazobactam (ZOSYN)  IV  3.375 g Intravenous Q8H  . sodium chloride  3 mL Intravenous Q12H  . sucralfate  1 g Oral TID WC & HS  . vancomycin  500 mg Intravenous Q24H   Anti-infectives   Start     Dose/Rate Route Frequency Ordered Stop   12/17/12 2000  vancomycin (VANCOCIN) 500 mg in sodium chloride 0.9 % 100 mL IVPB     500 mg 100 mL/hr over 60 Minutes Intravenous Every 24 hours 12/17/12 1052     12/15/12 1000  vancomycin (VANCOCIN) IVPB 750 mg/150 ml premix  Status:  Discontinued     750 mg 150 mL/hr over 60 Minutes Intravenous Every 24 hours 12/14/12 0951 12/17/12 1051   12/14/12 1000  vancomycin (VANCOCIN) IVPB 1000 mg/200 mL premix     1,000 mg 200 mL/hr over 60 Minutes Intravenous Every 24 hours 12/13/12 1232 12/14/12 1057   12/13/12 1700  piperacillin-tazobactam (ZOSYN) IVPB 3.375 g     3.375 g 12.5 mL/hr over 240 Minutes Intravenous Every 8 hours 12/13/12 1232     12/13/12 0630  vancomycin (VANCOCIN) IVPB 1000 mg/200 mL premix     1,000 mg 200 mL/hr over 60 Minutes Intravenous  Once 12/13/12 0627 12/13/12 1104   12/13/12 0630  piperacillin-tazobactam (ZOSYN) IVPB 3.375 g     3.375 g 100 mL/hr over 30 Minutes Intravenous  Once 12/13/12 4098 12/13/12 1005     Assessment: 79 y/o F developed septic shock due to uncertain source and was placed on empiric vancomycin + Zosyn.   After diagnostic workup was completed the etiology is now thought to be cellulitis of LLE.    Currently on  D#7 vancomycin 500 mg IV q24h / Zosyn 3.375 grams IV q8h (extended-infusion)  SCr increasing, and Vancomycin trough 20.2 on 8/18. Recheck today before 3rd dose of 500mg  q24 = 18.5 - within goal range  Risk factors for vancomycin-associated nephrotoxicity in this patient include age and concomitant Zosyn use.  May need to reduce Zosyn dose if renal function declines  further  Goal of Therapy:  Vancomycin trough level 15-20 mcg/ml  Plan:  1. Continue Vancomycin at 500 mg IV q24h  2. Continue Zosyn 3.375 grams IV q8h (extended-infusion) - reduce dose if renal function worsens 3. Follow serum creatinine closely while on vancomycin.  Otho Bellows PharmD Pager [phone] 12/19/2012, 9:35 PM

## 2012-12-20 DIAGNOSIS — I5023 Acute on chronic systolic (congestive) heart failure: Secondary | ICD-10-CM

## 2012-12-20 DIAGNOSIS — R609 Edema, unspecified: Secondary | ICD-10-CM

## 2012-12-20 DIAGNOSIS — D696 Thrombocytopenia, unspecified: Secondary | ICD-10-CM | POA: Diagnosis present

## 2012-12-20 DIAGNOSIS — E875 Hyperkalemia: Secondary | ICD-10-CM | POA: Diagnosis not present

## 2012-12-20 DIAGNOSIS — I509 Heart failure, unspecified: Secondary | ICD-10-CM

## 2012-12-20 LAB — BASIC METABOLIC PANEL
Calcium: 8.7 mg/dL (ref 8.4–10.5)
Creatinine, Ser: 1.86 mg/dL — ABNORMAL HIGH (ref 0.50–1.10)
GFR calc Af Amer: 27 mL/min — ABNORMAL LOW (ref >90)
GFR calc non Af Amer: 23 mL/min — ABNORMAL LOW (ref >90)
Sodium: 141 mEq/L (ref 135–145)

## 2012-12-20 LAB — CBC
MCH: 34.9 pg — ABNORMAL HIGH (ref 26.0–34.0)
MCHC: 33 g/dL (ref 30.0–36.0)
MCV: 105.7 fL — ABNORMAL HIGH (ref 78.0–100.0)
Platelets: 113 10*3/uL — ABNORMAL LOW (ref 150–400)
RBC: 2.61 MIL/uL — ABNORMAL LOW (ref 3.87–5.11)
RDW: 18.6 % — ABNORMAL HIGH (ref 11.5–15.5)

## 2012-12-20 MED ORDER — FLUCONAZOLE 100MG IVPB
100.0000 mg | INTRAVENOUS | Status: DC
  Administered 2012-12-20: 100 mg via INTRAVENOUS
  Filled 2012-12-20 (×2): qty 50

## 2012-12-20 MED ORDER — PIPERACILLIN-TAZOBACTAM IN DEX 2-0.25 GM/50ML IV SOLN
2.2500 g | Freq: Four times a day (QID) | INTRAVENOUS | Status: DC
  Administered 2012-12-20: 2.25 g via INTRAVENOUS
  Filled 2012-12-20 (×3): qty 50

## 2012-12-20 MED ORDER — FUROSEMIDE 10 MG/ML IJ SOLN
40.0000 mg | Freq: Once | INTRAMUSCULAR | Status: AC
  Administered 2012-12-20: 40 mg via INTRAVENOUS
  Filled 2012-12-20: qty 4

## 2012-12-20 NOTE — Progress Notes (Addendum)
[facility] PROGRESS NOTE  Nicole Roberts NWG:[id number] DOB: 1921/08/20 DOA: 12/13/2012 PCP: [name], MD  Brief narrative: 79 y.o. female with a PMH of atrial fibrillation, sinus syndrome status post permanent pacemaker in 2007, breast cancer status post lumpectomy, hypertension, hyperlipidemia, hypothyroidism and pulmonary hypertension who was recently treated for cellulitis. A review of her records reveals that she was hospitalized 09/16/2012-09/21/2012 for treatment of cellulitis and was ultimately discharged to a skilled nursing facility. She was also noted to have diarrhea during that hospitalization. Clostridium difficile studies were negative and she completed a course of 5 days of IV Zosyn and vancomycin. Doppler studies were negative for DVT. She was discharged home on an oral course of doxycycline and Cipro. She has also been under the care of gastroenterology for evaluation of weight loss and GI symptoms including intermittent nausea/vomiting/diarrhea. She underwent an EGD on 10/18/2012 which showed esophagitis for which she was treated with Carafate and PPI therapy. She presented to ED on 11/24/2012 with recurrent cellulitis for which she was given an IV dose of Cipro and sent home on oral doxycycline. She returned to the ED  on 12/13/2012 with fever and ongoing complaints of left lower extremity erythema and tenderness. Upon admission, the patient was noted to be persistently hypotensive despite IV fluids and was transferred to the ICU and placed on pressor support. She subsequently improved and was transferred to the floor where she did well up until 12/19/2012 when she became increasingly confused and weak. No significant changes in mental status.  Hospital course is complicated due to worsening heart failure and volume overload with minimal improvement with lasix IV. Cardiology consulted. I have confirmed with the patient's son that the patient's code status is  DNR.  Assessment/Plan:   Principal Problem:  Severe sepsis with septic shock secondary to left lower extremity cellulitis in the setting of chronic venous stasis/edema  - Likely secondary to left lower extremity cellulitis. Patient was initially on vancomycin and Zosyn. We will stop the Zosyn today and continue vancomycin. Initial Pro-calcitonin was  7.45 and trended down to 5.64--->3.14.  - Blood cultures to date are negative. Urine culture shows no growth. - Please note that the pt moved to the ICU on 12/13/2012 secondary to persistent hypotension and elevated serum lactate necessitating pressor support. Lactic acid now within normal limits. Moved out of the ICU 12/16/12.  - per PT evaluation: SNF recommended.   Active Problems:  Thrombocytopenia  - secondary to pradaxa - continue to monitor for signs of bleed HYPOTHYROIDISM  -Continue Synthroid 25 mcg daily HYPERLIPIDEMIA  -Continue lipitor 10 mg at bedtime Acute on chronic systolic CHF  - 2 D ECHO on this admission with EF 45% with akinesis of the mid-distal anteroseptal and apical myocardium, the study was not technically sufficient to allow evaluation of LV diastolic function.  - BNP markedly elevated at 7685. Diuretics were initially on hold secondary to hypotension. Chest x-ray done 12/19/2012 showed some pleural effusions but no frank pulmonary edema.  - volume overloaded on physical exam with JVD. Lasix given 40 mg IV 8/20 and 8/21. Still positive fluid balance + 2.5 L in past 24 hours.  - last weight 12/13/2012 67.31 kg; order placed for daily weight. - continue metoprolol and imdur Macrocytic anemia  - May continue Folvite. Hemoglobin stable at 9.1 and 9.8  - Fecal occult blood testing was negative  Atrial fibrillation  - On chronic anticoagulation with Pradaxa.  - Developed rapid ventricular response on 12/15/2012, responded to one dose of  IV metoprolol and resumption of usual home dose of Toprol-XL.  Stage III CKD (chronic  kidney disease)  - Baseline creatinine variable but GFR ranges from 31-44. Creatinine continues to trend up which could be due to lasix too. - continue to monitor renal function Yeast infection, oral thrush - order placed for fluconazole 100 mg daily  Depression  - Continue Celexa. Note: Dosage reduced to 20 mg daily per pharmacy recommendations to avoid prolonged QTC in patients greater than age 67.  Hypokalemia/ hyperkalemia  - Repleted and now patient has hyperkalemia. Patient will get one dose of Lasix which may drive potassium down. Rheumatoid arthritis  - Hold Arava in the setting of acute infection.  - On stress dose steroids given chronic treatment with prednisone 5 mg daily. Try to taper steroids from tomorrow.  Code Status: DNR Family Communication:  (son Art at [phone]); family not at the bedside Disposition Plan: SNF.    Medical Consultants:  Dr. Cyril Mourning, Critical care Other Consultants:  None. Anti-infectives:  Vancomycin 12/13/2012--->  Zosyn 12/13/2012---> 12/20/2012 Fluconazole 12/20/2012 -->   Manson Passey, MD  [facility] Pager [phone]  If 7PM-7AM, please contact night-coverage [email or web address] Password [facility] 12/20/2012, 12:50 PM   LOS: 7 days    HPI/Subjective: Patient had confusion over past 24 hours. No acute overnight events.  Objective: Filed Vitals:   12/19/12 0544 12/19/12 2148 12/20/12 0539 12/20/12 1120  BP: 128/84 126/80 118/70 119/83  Pulse: 61 60 70 60  Temp: 97.5 F (36.4 C) 97.5 F (36.4 C) 98 F (36.7 C)   TempSrc: Oral Oral Oral   Resp: 18 18 16    Height:      Weight:      SpO2: 100% 100% 92%     Intake/Output Summary (Last 24 hours) at 12/20/12 1250 Last data filed at 12/20/12 1115  Gross per 24 hour  Intake    473 ml  Output    950 ml  Net   -477 ml    Exam:   General:  Pt is alert, confused, not in acute distress  Cardiovascular: Irregular rhythm, rate controlled, S1/S2, SEM (+)  Respiratory: Clear to auscultation  bilaterally, no wheezing, no crackles, no rhonchi  Abdomen: Soft, non tender, non distended, bowel sounds present, no guarding  Extremities: Upper and lower extremity +2-3 edema, Unna boot  RLE, LLE wrapped  Neuro: Grossly nonfocal  Data Reviewed: Basic Metabolic Panel:  Recent Labs Lab 12/16/12 0500 12/17/12 0405 12/18/12 0405 12/19/12 0408 12/19/12 0820 12/20/12 0400  NA 137 139 140  --  137 141  K 4.2 4.5 4.9  --  5.8* 5.3*  CL 105 108 108  --  106 110  CO2 19 21 20   --  15* 20  GLUCOSE 107* 104* 93  --  102* 90  BUN 36* 39* 45*  --  56* 62*  CREATININE 1.41* 1.44* 1.52* 1.74* 1.75* 1.86*  CALCIUM 8.1* 8.1* 8.5  --  8.8 8.7   CBC:  Recent Labs Lab 12/13/12 2233  12/16/12 0500 12/17/12 0405 12/18/12 0405 12/19/12 0820 12/20/12 0400  WBC 17.4*  < > 14.2* 9.7 7.3 7.8 9.0  NEUTROABS 15.9*  --   --   --   --   --   --   HGB 8.0*  < > 8.1* 7.9* 7.9* 9.8* 9.1*  HCT 23.8*  < > 24.0* 24.1* 24.2* 30.5* 27.6*  MCV 105.8*  < > 105.3* 106.6* 108.0* 107.8* 105.7*  PLT 150  < > 116* 103* 94*  121* 113*    CULTURE, BLOOD (ROUTINE X 2)     Status: None   Collection Time    12/13/12  8:35 AM      Result Value Range Status   Value: NO GROWTH 5 DAYS     Performed at [facility]   Report Status 12/19/2012 FINAL   Final  CULTURE, BLOOD (ROUTINE X 2)     Status: None   Collection Time    12/13/12  9:00 AM      Result Value Range Status   Value: NO GROWTH 5 DAYS     Performed at [facility]   Report Status 12/19/2012 FINAL   Final  MRSA PCR SCREENING     Status: None   Collection Time    12/13/12 11:09 AM      Result Value Range Status   MRSA by PCR NEGATIVE  NEGATIVE Final  URINE CULTURE     Status: None   Collection Time    12/17/12 12:35 PM      Result Value Range Status   Specimen Description URINE, CLEAN CATCH   Final   Value: NO GROWTH     Performed at [facility]   Report Status 12/18/2012 FINAL   Final     Studies: Dg Chest Port  1 View  12/19/2012   *RADIOLOGY REPORT*  Clinical Data: Congestive heart failure, pulmonary edema  PORTABLE CHEST - 1 VIEW  Comparison: Chest radiograph 12/13/2012  Findings: Left-sided pacemaker overlies stable enlarged heart silhouette.  There are chronic bronchitic markings.  Small effusions present.  No focal infiltrate.  No pneumothorax.  IMPRESSION: 1.  Mild cardiomegaly and bilateral effusions. 2.  Chronic bronchitic markings and scarring. 3.  No pulmonary edema or infiltrate.   Original Report Authenticated By: Genevive Bi, M.D.    Scheduled Meds: . atorvastatin  10 mg Oral QHS  . calcium-vitamin D  1 tablet Oral BID WC  . citalopram  20 mg Oral Daily  . cyanocobalamin  500 mcg Oral Daily  . dabigatran  75 mg Oral Q12H  . feeding supplement  237 mL Oral BID BM  . fentaNYL  100 mcg Transdermal Q72H  . folic acid  0.5 mg Oral Daily  . gabapentin  100 mg Oral BID WC  . hydrocortisone sod succinate (SOLU-CORTEF) inj  50 mg Intravenous Q12H  . isosorbide mononitrate  15 mg Oral Daily  . levothyroxine  25 mcg Oral Q0600  . magic mouthwash w/lidocaine  5 mL Oral QID  . metoprolol succinate  50 mg Oral BID  . pantoprazole  40 mg Oral Daily  . sodium chloride  3 mL Intravenous Q12H  . sucralfate  1 g Oral TID WC & HS  . vancomycin  500 mg Intravenous Q24H

## 2012-12-20 NOTE — Progress Notes (Signed)
Dr. Elisabeth Pigeon called unit to inquire about pt. MD made aware of pt status and that pt had white, vaginal discharge as well as perineal area redness. See new orders entered by Dr. Elisabeth Pigeon. Son recently at bedside.

## 2012-12-20 NOTE — Consult Note (Addendum)
Reason for Consult: Congestive heart failure Referring Physician: [facility]  Primary cardiologist: Dr. Hillis Range  Nicole Roberts is an 79 y.o. female.  HPI: This 79 year old woman was admitted on 12/13/12 with recurrent cellulitis of left lower leg and fever.  She was hypotensive and did not respond to IV fluids and was transferred to the ICU pressor support.  She subsequently improved and was transferred to where she did well until 12/19/12 when she was noted to be increasingly confused and weak.  Today she is lethargic but denies any chest pain or dyspnea. She has a past history of paroxysmal atrial fibrillation and sick sinus syndrome) pacemaker implanted 2007.  She has a Producer, television/film/video.  She has been on long-term Pradaxa for anticoagulation.  She has a history of chronic edema secondary to venous insufficiency.  She has a past history pulmonary hypertension.  Most recent echocardiogram 12/16/2011 shows left ventricular systolic dysfunction with ejection fraction 45% and segmental wall motion abnormalities.  Chest x-ray on 12/19/12 shows mild cardiomegaly and mild pulmonary vascular congestion but no frank pulmonary edema, evaluated by me. Past Medical History  Diagnosis Date  . HEARING LOSS   . MITRAL VALVE INSUFF&AORTIC VALVE INSUFF     moderate MR  . PULMONARY HYPERTENSION   . Atrial fibrillation     permanent  . SICK SINUS SYNDROME     a. Tachybrady syndrome - Guidant PPM 10/2005.  . Edema 03/19/2009    due to venous insufficiency, chronic lymphedema  . URINARY INCONTINENCE   . Rheumatoid arthritis(714.0) dx clarified 2011    On prednisone  . BREAST CANCER 12/2006    ductal ca s/p L lumpectomy  . Diastolic dysfunction   . HYPERTENSION   . HYPERLIPIDEMIA   . GERD   . HYPOTHYROIDISM   . Hyperparathyroidism     2 lobes removed  . Venous insufficiency     chronic BLE edema  . Spinal stenosis   . Anemia     a. Macrocytic - normal B12, folate 08/2011. b. 3/6 heme  positive stools 10/2011 - per PCP note, elected against colonscopy.  [name] TIA (transient ischemic attack) 05/2011  . Anxiety     Past Surgical History  Procedure Laterality Date  . Pacemaker placement  2005    SSS and syncope  . Cholecystectomy  04/06/99  . Abdominal hysterectomy  1970    Partial  . Left hip arthroscopy  1995  . Right hip arthroscopy  01/2001  . Carpal tunnel release  1998    bilateral  . Breast surgery  12/2006    Left breast lupectomy- Ductal CA  . Left parathyroidectomy  07/2008  . Esophagogastroduodenoscopy N/A 10/18/2012    Procedure: ESOPHAGOGASTRODUODENOSCOPY (EGD);  Surgeon: Meryl Dare, MD;  Location: [facility];  Service: Endoscopy;  Laterality: N/A;    Family History  Problem Relation Age of Onset  . Ovarian cancer Mother   . Coronary artery disease Sister   . Coronary artery disease Brother   . Hypertension Mother     grandparent  . Lung cancer Father     Social History:  reports that she has never smoked. She has never used smokeless tobacco. She reports that she does not drink alcohol or use illicit drugs.  Allergies:  Allergies  Allergen Reactions  . Sulfa Antibiotics Itching  . Tramadol Hcl Itching and Rash    Medications:  Scheduled: . atorvastatin  10 mg Oral QHS  . calcium-vitamin D  1 tablet Oral BID WC  .  citalopram  20 mg Oral Daily  . cyanocobalamin  500 mcg Oral Daily  . dabigatran  75 mg Oral Q12H  . feeding supplement  237 mL Oral BID BM  . fentaNYL  100 mcg Transdermal Q72H  . fluconazole (DIFLUCAN) IV  100 mg Intravenous Q24H  . folic acid  0.5 mg Oral Daily  . gabapentin  100 mg Oral BID WC  . hydrocortisone sod succinate (SOLU-CORTEF) inj  50 mg Intravenous Q12H  . isosorbide mononitrate  15 mg Oral Daily  . levothyroxine  25 mcg Oral Q0600  . magic mouthwash w/lidocaine  5 mL Oral QID  . metoprolol succinate  50 mg Oral BID  . pantoprazole  40 mg Oral Daily  . sodium chloride  3 mL Intravenous Q12H  . sucralfate   1 g Oral TID WC & HS  . vancomycin  500 mg Intravenous Q24H    Results for orders placed during the hospital encounter of 12/13/12 (from the past 48 hour(s))  CREATININE, SERUM     Status: Abnormal   Collection Time    12/19/12  4:08 AM      Result Value Range   Creatinine, Ser 1.74 (*) 0.50 - 1.10 mg/dL   GFR calc non Af Amer 25 (*) >90 mL/min   GFR calc Af Amer 29 (*) >90 mL/min   Comment: (NOTE)     The eGFR has been calculated using the CKD EPI equation.     This calculation has not been validated in all clinical situations.     eGFR's persistently <90 mL/min signify possible Chronic Kidney     Disease.  CBC     Status: Abnormal   Collection Time    12/19/12  8:20 AM      Result Value Range   WBC 7.8  4.0 - 10.5 K/uL   RBC 2.83 (*) 3.87 - 5.11 MIL/uL   Hemoglobin 9.8 (*) 12.0 - 15.0 g/dL   Comment: REPEATED TO VERIFY     DELTA CHECK NOTED   HCT 30.5 (*) 36.0 - 46.0 %   MCV 107.8 (*) 78.0 - 100.0 fL   MCH 34.6 (*) 26.0 - 34.0 pg   MCHC 32.1  30.0 - 36.0 g/dL   RDW 04.5 (*) 40.9 - 81.1 %   Platelets 121 (*) 150 - 400 K/uL  BASIC METABOLIC PANEL     Status: Abnormal   Collection Time    12/19/12  8:20 AM      Result Value Range   Sodium 137  135 - 145 mEq/L   Potassium 5.8 (*) 3.5 - 5.1 mEq/L   Chloride 106  96 - 112 mEq/L   CO2 15 (*) 19 - 32 mEq/L   Glucose, Bld 102 (*) 70 - 99 mg/dL   BUN 56 (*) 6 - 23 mg/dL   Creatinine, Ser 9.14 (*) 0.50 - 1.10 mg/dL   Calcium 8.8  8.4 - 78.2 mg/dL   GFR calc non Af Amer 25 (*) >90 mL/min   GFR calc Af Amer 29 (*) >90 mL/min   Comment: (NOTE)     The eGFR has been calculated using the CKD EPI equation.     This calculation has not been validated in all clinical situations.     eGFR's persistently <90 mL/min signify possible Chronic Kidney     Disease.  VANCOMYCIN, TROUGH     Status: None   Collection Time    12/19/12  7:38 PM      Result Value  Range   Vancomycin Tr 18.5  10.0 - 20.0 ug/mL  CBC     Status: Abnormal    Collection Time    12/20/12  4:00 AM      Result Value Range   WBC 9.0  4.0 - 10.5 K/uL   Comment: WHITE COUNT CONFIRMED ON SMEAR     RARE NRBCs   RBC 2.61 (*) 3.87 - 5.11 MIL/uL   Hemoglobin 9.1 (*) 12.0 - 15.0 g/dL   HCT 16.1 (*) 09.6 - 04.5 %   MCV 105.7 (*) 78.0 - 100.0 fL   MCH 34.9 (*) 26.0 - 34.0 pg   MCHC 33.0  30.0 - 36.0 g/dL   RDW 40.9 (*) 81.1 - 91.4 %   Platelets 113 (*) 150 - 400 K/uL   Comment: SPECIMEN CHECKED FOR CLOTS     REPEATED TO VERIFY     PLATELET COUNT CONFIRMED BY SMEAR  BASIC METABOLIC PANEL     Status: Abnormal   Collection Time    12/20/12  4:00 AM      Result Value Range   Sodium 141  135 - 145 mEq/L   Potassium 5.3 (*) 3.5 - 5.1 mEq/L   Chloride 110  96 - 112 mEq/L   CO2 20  19 - 32 mEq/L   Glucose, Bld 90  70 - 99 mg/dL   BUN 62 (*) 6 - 23 mg/dL   Creatinine, Ser 7.82 (*) 0.50 - 1.10 mg/dL   Calcium 8.7  8.4 - 95.6 mg/dL   GFR calc non Af Amer 23 (*) >90 mL/min   GFR calc Af Amer 27 (*) >90 mL/min   Comment: (NOTE)     The eGFR has been calculated using the CKD EPI equation.     This calculation has not been validated in all clinical situations.     eGFR's persistently <90 mL/min signify possible Chronic Kidney     Disease.    Dg Chest Port 1 View  12/19/2012   *RADIOLOGY REPORT*  Clinical Data: Congestive heart failure, pulmonary edema  PORTABLE CHEST - 1 VIEW  Comparison: Chest radiograph 12/13/2012  Findings: Left-sided pacemaker overlies stable enlarged heart silhouette.  There are chronic bronchitic markings.  Small effusions present.  No focal infiltrate.  No pneumothorax.  IMPRESSION: 1.  Mild cardiomegaly and bilateral effusions. 2.  Chronic bronchitic markings and scarring. 3.  No pulmonary edema or infiltrate.   Original Report Authenticated By: Genevive Bi, M.D.    The patient is very weak at the present time.  Prior to this hospitalization she was living independently at [facility]. Blood pressure 111/73, pulse 60,  temperature 97 F (36.1 C), temperature source Oral, resp. rate 18, height 5' 4.17" (1.63 m), weight 148 lb 6.4 oz (67.314 kg), SpO2 94.00%. The patient appears to be in no distress.  She appears to be very pale.  Head and neck exam reveals that the pupils are equal and reactive.  The extraocular movements are full.  There is no scleral icterus.  Mouth and pharynx are benign.  No lymphadenopathy.  No carotid bruits.  The jugular venous pressure is elevated.  Thyroid is not enlarged or tender.  Chest reveals fine inspiratory rales.  She is not tachypneic or in any respiratory distress.  Heart reveals no abnormal lift or heave.  First and second heart sounds are normal.  There is no  gallop rub or click.  There is a grade 1/6 systolic ejection murmur at the base.  No diastolic murmur.  The abdomen is soft and nontender.  Bowel sounds are normoactive.  There is no hepatosplenomegaly or mass.  There are no abdominal bruits.  Extremities both lower extremities are wrapped in bandages.  The right leg is also supported with an external brace.  Neurologic exam is not tested.  Patient very lethargic. EKG on 12/14/12 shows normal sinus rhythm and atrial pacing.  Assessment/Plan: 1.  Ischemic heart disease with left ventricular systolic dysfunction and segmental wall motion abnormalities.  Because of her overall status and lack of active ischemic symptoms, no further ischemic workup is planned. 2.  Acute on chronic systolic congestive heart failure with previous elevated proBNP of 7685 on 12/13/12 3. long history of venous insufficiency and return lower extremity  cellulitis 4. history of sick sinus syndrome with past history of paroxysmal atrial fibrillation and with a functioning Guidant pacemaker in place. 5. chronic renal insufficiency 6. anemia of chronic disease  Recommend: For her ischemic heart disease continue statins and nitrates. For her congestive heart failure continue gentle diuresis with  Lasix.  At the present time she is not significantly symptomatic in terms of her breathing.  Her peripheral edema represents her chronic venous insufficiency.  Would not push too hard with diuretics in view of her past history of hypotension.  Would try to get consistent daily weights on her to help monitor progress. I will update her EKG. She is not on telemetry.  Overall prognosis appears poor.  May wish to readdress CODE STATUS.  Cassell Clement 12/20/2012, 2:48 PM

## 2012-12-21 LAB — BASIC METABOLIC PANEL
BUN: 67 mg/dL — ABNORMAL HIGH (ref 6–23)
CO2: 20 mEq/L (ref 19–32)
CO2: 22 mEq/L (ref 19–32)
Calcium: 8.8 mg/dL (ref 8.4–10.5)
Chloride: 111 mEq/L (ref 96–112)
Creatinine, Ser: 1.8 mg/dL — ABNORMAL HIGH (ref 0.50–1.10)
Creatinine, Ser: 1.82 mg/dL — ABNORMAL HIGH (ref 0.50–1.10)
GFR calc non Af Amer: 24 mL/min — ABNORMAL LOW (ref >90)
Sodium: 142 mEq/L (ref 135–145)

## 2012-12-21 LAB — CBC
HCT: 32.3 % — ABNORMAL LOW (ref 36.0–46.0)
MCV: 106.6 fL — ABNORMAL HIGH (ref 78.0–100.0)
RDW: 18.5 % — ABNORMAL HIGH (ref 11.5–15.5)
WBC: 12.2 10*3/uL — ABNORMAL HIGH (ref 4.0–10.5)

## 2012-12-21 LAB — VANCOMYCIN, TROUGH: Vancomycin Tr: 21.4 ug/mL — ABNORMAL HIGH (ref 10.0–20.0)

## 2012-12-21 MED ORDER — FLUCONAZOLE 100MG IVPB
50.0000 mg | INTRAVENOUS | Status: DC
  Administered 2012-12-21 – 2012-12-23 (×3): 50 mg via INTRAVENOUS
  Filled 2012-12-21 (×4): qty 25

## 2012-12-21 MED ORDER — FUROSEMIDE 10 MG/ML IJ SOLN
40.0000 mg | Freq: Once | INTRAMUSCULAR | Status: AC
  Administered 2012-12-21: 40 mg via INTRAVENOUS
  Filled 2012-12-21: qty 4

## 2012-12-21 MED ORDER — HYDROCORTISONE SOD SUCCINATE 100 MG IJ SOLR
50.0000 mg | Freq: Every day | INTRAMUSCULAR | Status: DC
  Filled 2012-12-21: qty 1

## 2012-12-21 MED ORDER — VANCOMYCIN HCL IN DEXTROSE 750-5 MG/150ML-% IV SOLN
750.0000 mg | INTRAVENOUS | Status: DC
  Administered 2012-12-23: 750 mg via INTRAVENOUS
  Filled 2012-12-21: qty 150

## 2012-12-21 MED ORDER — MORPHINE SULFATE 4 MG/ML IJ SOLN
4.0000 mg | INTRAMUSCULAR | Status: DC | PRN
  Administered 2012-12-21: 4 mg via INTRAVENOUS

## 2012-12-21 NOTE — Progress Notes (Signed)
   SUBJECTIVE:  She is more alert today.  No acute SOB   PHYSICAL EXAM Filed Vitals:   12/20/12 2225 12/21/12 0500 12/21/12 0539 12/21/12 1400  BP: 136/84  138/90 126/60  Pulse: 62  70 69  Temp: 97.4 F (36.3 C)  97.3 F (36.3 C) 97.4 F (36.3 C)  TempSrc: Axillary  Axillary Oral  Resp: 16  16 18   Height:      Weight:  158 lb 8.2 oz (71.9 kg)    SpO2: 99%  97% 93%   General:  No distress.  Frail Lungs:  No crackles Heart:  RRR Abdomen:  Positive bowel sounds, no rebound no guarding Extremities:  Severe diffuse edema  LABS:  Results for orders placed during the hospital encounter of 12/13/12 (from the past 24 hour(s))  BASIC METABOLIC PANEL     Status: Abnormal   Collection Time    12/21/12 12:35 PM      Result Value Range   Sodium 144  135 - 145 mEq/L   Potassium 4.4  3.5 - 5.1 mEq/L   Chloride 111  96 - 112 mEq/L   CO2 20  19 - 32 mEq/L   Glucose, Bld 88  70 - 99 mg/dL   BUN 67 (*) 6 - 23 mg/dL   Creatinine, Ser 1.61 (*) 0.50 - 1.10 mg/dL   Calcium 9.1  8.4 - 09.6 mg/dL   GFR calc non Af Amer 24 (*) >90 mL/min   GFR calc Af Amer 28 (*) >90 mL/min  CBC     Status: Abnormal   Collection Time    12/21/12 12:35 PM      Result Value Range   WBC 12.2 (*) 4.0 - 10.5 K/uL   RBC 3.03 (*) 3.87 - 5.11 MIL/uL   Hemoglobin 10.6 (*) 12.0 - 15.0 g/dL   HCT 04.5 (*) 40.9 - 81.1 %   MCV 106.6 (*) 78.0 - 100.0 fL   MCH 35.0 (*) 26.0 - 34.0 pg   MCHC 32.8  30.0 - 36.0 g/dL   RDW 91.4 (*) 78.2 - 95.6 %   Platelets 160  150 - 400 K/uL    Intake/Output Summary (Last 24 hours) at 12/21/12 1452 Last data filed at 12/21/12 1400  Gross per 24 hour  Intake    500 ml  Output    800 ml  Net   -300 ml    ASSESSMENT AND PLAN:  Acute on chronic systolic and diastolic HF:    Mobilization of her diffuse edema will be a slow process with her immobility and severe protein calorie malnutrition.  She does have a mildly reduce EF compared with previous but does not appear to be in acute  left sided failure.  I would aim for slightly more out than in daily.  They are unable to get accurate weights.  She had IV Lasix today.  Check a creat in the AM prior to deciding on further dosing.  It would be reasonable to continue low dose IV as gut edema will like reduce PO absorption.  Also could use Demadex PO perhaps 20 mg per day to begin with.   Atrial fib:  She seems to be tolerating dose adjusted Pradaxa.    Rollene Rotunda 12/21/2012 2:52 PM

## 2012-12-21 NOTE — Progress Notes (Addendum)
[facility] PROGRESS NOTE  Deionna Marcantonio GMW:[id number] DOB: 1921/12/07 DOA: 12/13/2012 PCP: [name], MD  Brief narrative: 79 y.o. female with a PMH of atrial fibrillation, sinus syndrome status post permanent pacemaker in 2007, breast cancer status post lumpectomy, hypertension, hyperlipidemia, hypothyroidism and pulmonary hypertension who was recently treated for cellulitis. A review of her records reveals that she was hospitalized 09/16/2012-09/21/2012 for treatment of cellulitis and was ultimately discharged to a skilled nursing facility. She was also noted to have diarrhea during that hospitalization. Clostridium difficile studies were negative and she completed a course of 5 days of IV Zosyn and vancomycin. Doppler studies were negative for DVT. She was discharged home on an oral course of doxycycline and Cipro. She has also been under the care of gastroenterology for evaluation of weight loss and GI symptoms including intermittent nausea/vomiting/diarrhea. She underwent an EGD on 10/18/2012 which showed esophagitis for which she was treated with Carafate and PPI therapy. She presented to ED on 11/24/2012 with recurrent cellulitis for which she was given an IV dose of Cipro and sent home on oral doxycycline. She returned to the ED on 12/13/2012 with fever and ongoing complaints of left lower extremity erythema and tenderness. Upon admission, the patient was noted to be persistently hypotensive despite IV fluids and was transferred to the ICU and placed on pressor support. She subsequently improved and was transferred to the floor where she did well up until 12/19/2012 when she became increasingly confused and weak. No significant changes in mental status.  Hospital course is complicated due to worsening heart failure and volume overload with minimal improvement with lasix IV. Cardiology consulted. I have confirmed with the patient's son that the patient's code status is DNR/DNI. He also  informed me that she did not wish to be kept alive artificially and did not want any aggressive intervention which includes but is not limited to feeding tube placement if she ever needed it. Patient's son has agreed to palliative care consultation for goals of care.  Assessment/Plan:   Principal Problem:  Severe sepsis with septic shock secondary to left lower extremity cellulitis in the setting of chronic venous stasis/edema  - Likely secondary to left lower extremity cellulitis. Patient was initially on vancomycin and Zosyn. Continue vancomycin. Initial Pro-calcitonin was 7.45 and trended down to 5.64--->3.14.  - Blood cultures to date are negative. Urine culture shows no growth.  - Please note that the pt moved to the ICU on 12/13/2012 secondary to persistent hypotension and elevated serum lactate necessitating pressor support. Lactic acid now within normal limits. Moved out of the ICU 12/16/12.  - per PT evaluation: SNF recommended.   Active Problems:  Thrombocytopenia  - secondary to pradaxa  - continue to monitor for signs of bleed  HYPOTHYROIDISM  -Continue Synthroid 25 mcg daily  HYPERLIPIDEMIA  -Continue lipitor 10 mg at bedtime  Acute on chronic systolic CHF  - 2 D ECHO on this admission with EF 45% with akinesis of the mid-distal anteroseptal and apical myocardium, the study was not technically sufficient to allow evaluation of LV diastolic function.  - BNP markedly elevated at 7685. Diuretics were initially on hold secondary to hypotension. Chest x-ray done 12/19/2012 showed some pleural effusions but no frank pulmonary edema.  - volume overloaded on physical exam with JVD. Lasix given 40 mg IV 8/20 and 8/21. Still edematous. Follow up BMP  this morning. - last weight 12/13/2012 67.31 kg; weight 12/20/2012 is a 75.9 kg and subsequently 71.9 kg. since there is some  improvement in weight we will give 1 dose of Lasix today 40 mg IV  - Appreciate cardiology consultation and  recommendations - continue metoprolol and imdur  Macrocytic anemia  - May continue Folvite. Hemoglobin stable at 9.1 and 9.8  - Fecal occult blood testing was negative  Atrial fibrillation  - On chronic anticoagulation with Pradaxa.  - Developed rapid ventricular response on 12/15/2012, responded to one dose of IV metoprolol and resumption of usual home dose of Toprol-XL.  Stage III CKD (chronic kidney disease)  - Baseline creatinine variable but GFR ranges from 31-44. Creatinine continues to trend up which could be due to lasix too.  - Followup BMP this morning Yeast infection, oral thrush  - order placed for fluconazole 100 mg daily  Depression  - Continue Celexa. Note: Dosage reduced to 20 mg daily per pharmacy recommendations to avoid prolonged QTC in patients greater than age 49.  Hypokalemia/ hyperkalemia  - Repleted and the patient developed hyperkalemia. Followup BMP this morning  Rheumatoid arthritis  - Hold Arava in the setting of acute infection.  - On stress dose steroids given chronic treatment with prednisone 5 mg daily. Tapered of Solu-Cortef to once daily regimen.   Code Status: DNR/DNI Family Communication: (son Art at [phone]) at the bedside  Disposition Plan: SNF.    Medical Consultants:  Dr. Cyril Mourning, Critical care Other Consultants:  None. Anti-infectives:  Vancomycin 12/13/2012--->  Zosyn 12/13/2012---> 12/20/2012  Fluconazole 12/20/2012 -->   Manson Passey, MD  [facility]  Pager [phone]   If 7PM-7AM, please contact night-coverage [email or web address] Password [facility] 12/21/2012, 6:47 AM   LOS: 8 days    HPI/Subjective: No acute overnight events.  Objective: Filed Vitals:   12/20/12 1400 12/20/12 2225 12/21/12 0500 12/21/12 0539  BP:  136/84  138/90  Pulse:  62  70  Temp:  97.4 F (36.3 C)  97.3 F (36.3 C)  TempSrc:  Axillary  Axillary  Resp:  16  16  Height:      Weight: 75.9 kg (167 lb 5.3 oz)  71.9 kg (158 lb 8.2 oz)   SpO2:  99%  97%     Intake/Output Summary (Last 24 hours) at 12/21/12 0647 Last data filed at 12/21/12 0538  Gross per 24 hour  Intake    383 ml  Output    600 ml  Net   -217 ml    Exam:   General:  Pt is confused but not in acute distress  Cardiovascular: Irregular rhythm, rate controlled, appreciate S1/S2, SEM appreciated  Respiratory: Coarse breath sounds bilaterally, no wheezing  Abdomen: Soft, non tender, non distended, bowel sounds present, no guarding  Extremities: Anasarca, pulses DP and PT palpable bilaterally; left unna boot  Neuro: Grossly nonfocal  Data Reviewed: Basic Metabolic Panel:  Recent Labs Lab 12/16/12 0500 12/17/12 0405 12/18/12 0405 12/19/12 0408 12/19/12 0820 12/20/12 0400  NA 137 139 140  --  137 141  K 4.2 4.5 4.9  --  5.8* 5.3*  CL 105 108 108  --  106 110  CO2 19 21 20   --  15* 20  GLUCOSE 107* 104* 93  --  102* 90  BUN 36* 39* 45*  --  56* 62*  CREATININE 1.41* 1.44* 1.52* 1.74* 1.75* 1.86*  CALCIUM 8.1* 8.1* 8.5  --  8.8 8.7   CBC:  Recent Labs Lab 12/16/12 0500 12/17/12 0405 12/18/12 0405 12/19/12 0820 12/20/12 0400  WBC 14.2* 9.7 7.3 7.8 9.0  HGB 8.1* 7.9* 7.9* 9.8* 9.1*  HCT 24.0* 24.1* 24.2* 30.5* 27.6*  MCV 105.3* 106.6* 108.0* 107.8* 105.7*  PLT 116* 103* 94* 121* 113*    CULTURE, BLOOD (ROUTINE X 2)     Status: None   Collection Time    12/13/12  8:35 AM      Result Value Range Status   Specimen Description BLOOD RIGHT ANTECUBITAL   Final   Special Requests BOTTLES DRAWN AEROBIC ONLY 4CC   Final   Culture  Setup Time     Final   Value: 12/13/2012 10:22     Performed at [facility]   Culture     Final   Value: NO GROWTH 5 DAYS     Performed at [facility]   Report Status 12/19/2012 FINAL   Final  CULTURE, BLOOD (ROUTINE X 2)     Status: None   Collection Time    12/13/12  9:00 AM      Result Value Range Status   Specimen Description BLOOD RIGHT ANTECUBITAL   Final   Special Requests BOTTLES DRAWN  AEROBIC AND ANAEROBIC 5CC   Final   Culture  Setup Time     Final   Value: 12/13/2012 10:22     Performed at [facility]   Culture     Final   Value: NO GROWTH 5 DAYS     Performed at [facility]   Report Status 12/19/2012 FINAL   Final  MRSA PCR SCREENING     Status: None   Collection Time    12/13/12 11:09 AM      Result Value Range Status   MRSA by PCR NEGATIVE  NEGATIVE Final  MRSA PCR SCREENING     Status: None      Result Value Range Status     MRSA infections.  URINE CULTURE     Status: None   Collection Time    12/17/12 12:35 PM      Result Value Range Status   Specimen Description URINE, CLEAN CATCH   Final   Special Requests NONE   Final   Culture  Setup Time     Final   Value: 12/17/2012 21:56     Performed at [facility] Count     Final   Value: NO GROWTH     Performed at [facility]   Culture     Final   Value: NO GROWTH     Performed at [facility]   Report Status 12/18/2012 FINAL   Final     Studies: Dg Chest Port 1 View 12/19/2012   *  IMPRESSION: 1.  Mild cardiomegaly and bilateral effusions. 2.  Chronic bronchitic markings and scarring. 3.  No pulmonary edema or infiltrate.   Original Report Authenticated By: Genevive Bi, M.D.    Scheduled Meds: . atorvastatin  10 mg Oral QHS  . calcium-vitamin D  1 tablet Oral BID WC  . citalopram  20 mg Oral Daily  . cyanocobalamin  500 mcg Oral Daily  . dabigatran  75 mg Oral Q12H  . fentaNYL  100 mcg Transdermal Q72H  . fluconazole (DIFLUCAN)   100 mg Intravenous Q24H  . folic acid  0.5 mg Oral Daily  . gabapentin  100 mg Oral BID WC  . SOLU-CORTEF  50 mg Intravenous Q12H  . isosorbide mononitrate  15 mg Oral Daily  . levothyroxine  25 mcg Oral Q0600  . magic mouthwash w/lidocaine  5 mL Oral QID  .  metoprolol succinate  50 mg Oral BID  . pantoprazole  40 mg Oral Daily  . sucralfate  1 g Oral TID WC & HS  . vancomycin  500 mg Intravenous Q24H

## 2012-12-21 NOTE — Progress Notes (Addendum)
Thank you for consulting the Palliative Medicine Team at [facility] to meet your patient's and family's needs.   The reason that you asked [place] to see your patient is  For Goals of Care meeting  We have scheduled your patient for a meeting: Sat. 12/22/12 at 2pm  The Surrogate decision make is: Son Felipe Cabell [phone]  Your patient is able/unable to participate:Per RN, pt normally alert & oriented; has had AMS during admission  Additional Narrative: RN reports sxs well managed at this time. Did not speak directly with pt as son requested to speak outside the room. Son agreeable to goals of care meeting, aware of plan for discussion of pt's wishes regarding plan of care post d/c and in coming time given chronic conditions. Son reports pt currently has home health aid at home [facility] [place]), note CSW notes for potential SNF stay at d/c. Son reports family coping as "well as can be", pt's other children are all out of town. Provided team phone for needs as they arise.   Kennieth Francois, LCSW PMT Phone [phone]

## 2012-12-21 NOTE — Progress Notes (Signed)
Pt's son concerned about pt wearing unna boots and her experiencing water retention. Paged NP on call. NP requested that I Tell the pt's son to express concerns to MD on rounds in the morning. Informed pt's son of NP's response to which he said he would not be able to come early when the doctors arrived but that he would be here around 11:30 am.

## 2012-12-21 NOTE — Progress Notes (Signed)
Physical Therapy Treatment Patient Details Name: Nicole Roberts MRN: [id number] DOB: March 17, 1922 Today's Date: 12/21/2012 Time: 4098-1191 PT Time Calculation (min): 30 min  PT Assessment / Plan / Recommendation  History of Present Illness     PT Comments   Per son, pt is more alert and talking more today, some of speech is not clear. Pt continues to require = 2 total assist for mobility.   Follow Up Recommendations  SNF     Does the patient have the potential to tolerate intense rehabilitation     Barriers to Discharge        Equipment Recommendations  None recommended by PT    Recommendations for Other Services    Frequency Min 3X/week   Progress towards PT Goals Progress towards PT goals: Not progressing toward goals - comment (pt has had a decline in mentation and function.)  Plan Current plan remains appropriate    Precautions / Restrictions Precautions Precautions: Fall        Mobility  Bed Mobility Rolling Left: Patient Percentage: 0% Supine to Sit: 1: +2 Total assist Supine to Sit: Patient Percentage: 0% Sit to Supine: 1: +2 Total assist Sit to Supine: Patient Percentage: 0% Details for Bed Mobility Assistance: HOB elevated 50 degrees with + 2 total assist and pad to swival hips to EOB with pt unable to assist due to increased edema throughout and increased weakness Transfers Transfers: Not assessed    Exercises     PT Diagnosis:    PT Problem List:   PT Treatment Interventions:     PT Goals (current goals can now be found in the care plan section)    Visit Information  Last PT Received On: 12/21/12 Assistance Needed: +2    Subjective Data      Cognition  Cognition Arousal/Alertness: Awake/alert Behavior During Therapy: WFL for tasks assessed/performed (per son, pt more alert and talking)    Balance  Static Sitting Balance Static Sitting - Balance Support: Bilateral upper extremity supported Static Sitting - Level of Assistance: 1: +1 Total  assist Static Sitting - Comment/# of Minutes: sat at edge of bed x 10 minutes, no balance responses .  End of Session PT - End of Session Equipment Utilized During Treatment: Oxygen Activity Tolerance: Patient tolerated treatment well Patient left: in bed;with call bell/phone within reach;with family/visitor present Nurse Communication: Need for lift equipment   GP     Rada Hay 12/21/2012, 2:37 PM Blanchard Kelch PT [phone]

## 2012-12-21 NOTE — Progress Notes (Signed)
Ulna boots removed per MD Bonna Gains, Myrtie Hawk RN 12-21-2012 12:00pm

## 2012-12-21 NOTE — Progress Notes (Signed)
ANTIBIOTIC CONSULT NOTE - follow-up  Pharmacy Consult for Vancomycin Indication: Sepsis secondary to LLE cellulitis  Allergies  Allergen Reactions  . Sulfa Antibiotics Itching  . Tramadol Hcl Itching and Rash   Patient Measurements: Height: 5' 4.17" (163 cm) (taken 10/15/12) Weight: 158 lb 8.2 oz (71.9 kg) IBW/kg (Calculated) : 55.1  Vital Signs: Temp: 97.4 F (36.3 C) (08/22 1400) Temp src: Oral (08/22 1400) BP: 126/60 mmHg (08/22 1400) Pulse Rate: 69 (08/22 1400) Intake/Output from previous day: 08/21 0701 - 08/22 0700 In: 383 [P.O.:180; I.V.:203] Out: 600 [Urine:600] Intake/Output from this shift:    Labs:  Recent Labs  12/19/12 0820 12/20/12 0400 12/21/12 1235 12/21/12 1943  WBC 7.8 9.0 12.2*  --   HGB 9.8* 9.1* 10.6*  --   PLT 121* 113* 160  --   CREATININE 1.75* 1.86* 1.80* 1.82*   Estimated Creatinine Clearance: 20.8 ml/min (by C-G formula based on Cr of 1.82).  Recent Labs  12/19/12 1938 12/21/12 1943  VANCOTROUGH 18.5 21.4*     Microbiology: Recent Results (from the past 720 hour(s))  CULTURE, BLOOD (ROUTINE X 2)     Status: None   Collection Time    12/13/12  8:35 AM      Result Value Range Status   Specimen Description BLOOD RIGHT ANTECUBITAL   Final   Special Requests BOTTLES DRAWN AEROBIC ONLY 4CC   Final   Culture  Setup Time     Final   Value: 12/13/2012 10:22     Performed at [facility]   Culture     Final   Value: NO GROWTH 5 DAYS     Performed at [facility]   Report Status 12/19/2012 FINAL   Final  CULTURE, BLOOD (ROUTINE X 2)     Status: None   Collection Time    12/13/12  9:00 AM      Result Value Range Status   Specimen Description BLOOD RIGHT ANTECUBITAL   Final   Special Requests BOTTLES DRAWN AEROBIC AND ANAEROBIC 5CC   Final   Culture  Setup Time     Final   Value: 12/13/2012 10:22     Performed at [facility]   Culture     Final   Value: NO GROWTH 5 DAYS     Performed at [facility]   Report Status 12/19/2012 FINAL   Final  MRSA PCR SCREENING     Status: None   Collection Time    12/13/12 11:09 AM      Result Value Range Status   MRSA by PCR NEGATIVE  NEGATIVE Final   Comment:            The GeneXpert MRSA Assay (FDA     approved for NASAL specimens     only), is one component of a     comprehensive MRSA colonization     surveillance program. It is not     intended to diagnose MRSA     infection nor to guide or     monitor treatment for     MRSA infections.  MRSA PCR SCREENING     Status: None   Collection Time    12/13/12  7:59 PM      Result Value Range Status   MRSA by PCR NEGATIVE  NEGATIVE Final   Comment:            The GeneXpert MRSA Assay (FDA     approved for NASAL specimens  only), is one component of a     comprehensive MRSA colonization     surveillance program. It is not     intended to diagnose MRSA     infection nor to guide or     monitor treatment for     MRSA infections.  URINE CULTURE     Status: None   Collection Time    12/17/12 12:35 PM      Result Value Range Status   Specimen Description URINE, CLEAN CATCH   Final   Special Requests NONE   Final   Culture  Setup Time     Final   Value: 12/17/2012 21:56     Performed at [facility] Count     Final   Value: NO GROWTH     Performed at [facility]   Culture     Final   Value: NO GROWTH     Performed at [facility]   Report Status 12/18/2012 FINAL   Final   Medications:  Scheduled:  . atorvastatin  10 mg Oral QHS  . calcium-vitamin D  1 tablet Oral BID WC  . citalopram  20 mg Oral Daily  . cyanocobalamin  500 mcg Oral Daily  . dabigatran  75 mg Oral Q12H  . feeding supplement  237 mL Oral BID BM  . fentaNYL  100 mcg Transdermal Q72H  . fluconazole (DIFLUCAN) IV  50 mg Intravenous Q24H  . folic acid  0.5 mg Oral Daily  . gabapentin  100 mg Oral BID WC  . [START ON 12/22/2012] hydrocortisone sod succinate (SOLU-CORTEF)  inj  50 mg Intravenous Daily  . isosorbide mononitrate  15 mg Oral Daily  . levothyroxine  25 mcg Oral Q0600  . magic mouthwash w/lidocaine  5 mL Oral QID  . metoprolol succinate  50 mg Oral BID  . pantoprazole  40 mg Oral Daily  . sodium chloride  3 mL Intravenous Q12H  . sucralfate  1 g Oral TID WC & HS  . vancomycin  500 mg Intravenous Q24H   Anti-infectives   Start     Dose/Rate Route Frequency Ordered Stop   12/21/12 1400  fluconazole (DIFLUCAN) IVPB 50 mg     50 mg 25 mL/hr over 60 Minutes Intravenous Every 24 hours 12/21/12 0855     12/20/12 1400  fluconazole (DIFLUCAN) IVPB 100 mg  Status:  Discontinued     100 mg 50 mL/hr over 60 Minutes Intravenous Every 24 hours 12/20/12 1303 12/21/12 0855   12/20/12 1000  piperacillin-tazobactam (ZOSYN) IVPB 2.25 g  Status:  Discontinued     2.25 g 100 mL/hr over 30 Minutes Intravenous Every 6 hours 12/20/12 0842 12/20/12 1250   12/17/12 2000  vancomycin (VANCOCIN) 500 mg in sodium chloride 0.9 % 100 mL IVPB     500 mg 100 mL/hr over 60 Minutes Intravenous Every 24 hours 12/17/12 1052     12/15/12 1000  vancomycin (VANCOCIN) IVPB 750 mg/150 ml premix  Status:  Discontinued     750 mg 150 mL/hr over 60 Minutes Intravenous Every 24 hours 12/14/12 0951 12/17/12 1051   12/14/12 1000  vancomycin (VANCOCIN) IVPB 1000 mg/200 mL premix     1,000 mg 200 mL/hr over 60 Minutes Intravenous Every 24 hours 12/13/12 1232 12/14/12 1057   12/13/12 1700  piperacillin-tazobactam (ZOSYN) IVPB 3.375 g  Status:  Discontinued     3.375 g 12.5 mL/hr over 240 Minutes Intravenous Every 8 hours 12/13/12  1232 12/20/12 0842   12/13/12 0630  vancomycin (VANCOCIN) IVPB 1000 mg/200 mL premix     1,000 mg 200 mL/hr over 60 Minutes Intravenous  Once 12/13/12 0627 12/13/12 1104   12/13/12 0630  piperacillin-tazobactam (ZOSYN) IVPB 3.375 g     3.375 g 100 mL/hr over 30 Minutes Intravenous  Once 12/13/12 1610 12/13/12 1005     Assessment: 79 y/o F developed septic  shock due to uncertain source and was placed on empiric vancomycin + Zosyn.   After diagnostic workup was completed the etiology is now thought to be cellulitis of LLE.    8/14 >> Vanc >> 8/14 >> Zosyn >> 8/21 8/21 >> Fluconazole (MD) for thrush >>   Tmax: improved to AF WBCs: improved to WNL Renal: Stage III CKD; SCr incr to 1.86, CrCl ~ 20 ml/min   Cultures: 8/14 blood x2: NGF 8/18 Urine: NG F MRSA PCR neg   Dose changes/drug level info:  8/15: change vancomycin from 1gm IV q24h to 750mg  IV q24h for CRI/age 28/18 VT = 20.2 on 750 q24h - reduced to 500 q24h. Intercepted today's 10am dose before given, so begin new dosage tonight at 8pm. 8/20 1900 VT = 18.5 on 500 q24h - no change 8/22 2000 VT = 21.4 mcg/ml on 500mg  IV q24h  Goal of Therapy:  Vancomycin trough level 15-20 mcg/ml  Plan:  Day #9 vancomycin   Vancomycin trough elevated this evening, SCr stable, adjust dose; change dose to vancomycin 750mg  IV q48 for est pk = 29 and est trough = 51mcg/ml  Juliette Alcide, PharmD, BCPS.   Pager: [phone] 8:59 PM 12/21/2012

## 2012-12-21 NOTE — Progress Notes (Addendum)
ANTIBIOTIC CONSULT NOTE - follow-up  Pharmacy Consult for Vancomycin Indication: Sepsis secondary to LLE cellulitis  Allergies  Allergen Reactions  . Sulfa Antibiotics Itching  . Tramadol Hcl Itching and Rash   Patient Measurements: Height: 5' 4.17" (163 cm) (taken 10/15/12) Weight: 158 lb 8.2 oz (71.9 kg) IBW/kg (Calculated) : 55.1  Vital Signs: Temp: 97.3 F (36.3 C) (08/22 0539) Temp src: Axillary (08/22 0539) BP: 138/90 mmHg (08/22 0539) Pulse Rate: 70 (08/22 0539) Intake/Output from previous day: 08/21 0701 - 08/22 0700 In: 383 [P.O.:180; I.V.:203] Out: 600 [Urine:600] Intake/Output from this shift:    Labs:  Recent Labs  12/19/12 0408 12/19/12 0820 12/20/12 0400  WBC  --  7.8 9.0  HGB  --  9.8* 9.1*  PLT  --  121* 113*  CREATININE 1.74* 1.75* 1.86*   Estimated Creatinine Clearance: 20.4 ml/min (by C-G formula based on Cr of 1.86).  Recent Labs  12/19/12 1938  VANCOTROUGH 18.5     Microbiology: Recent Results (from the past 720 hour(s))  CULTURE, BLOOD (ROUTINE X 2)     Status: None   Collection Time    12/13/12  8:35 AM      Result Value Range Status   Specimen Description BLOOD RIGHT ANTECUBITAL   Final   Special Requests BOTTLES DRAWN AEROBIC ONLY 4CC   Final   Culture  Setup Time     Final   Value: 12/13/2012 10:22     Performed at [facility]   Culture     Final   Value: NO GROWTH 5 DAYS     Performed at [facility]   Report Status 12/19/2012 FINAL   Final  CULTURE, BLOOD (ROUTINE X 2)     Status: None   Collection Time    12/13/12  9:00 AM      Result Value Range Status   Specimen Description BLOOD RIGHT ANTECUBITAL   Final   Special Requests BOTTLES DRAWN AEROBIC AND ANAEROBIC 5CC   Final   Culture  Setup Time     Final   Value: 12/13/2012 10:22     Performed at [facility]   Culture     Final   Value: NO GROWTH 5 DAYS     Performed at [facility]   Report Status 12/19/2012 FINAL   Final  MRSA  PCR SCREENING     Status: None   Collection Time    12/13/12 11:09 AM      Result Value Range Status   MRSA by PCR NEGATIVE  NEGATIVE Final   Comment:            The GeneXpert MRSA Assay (FDA     approved for NASAL specimens     only), is one component of a     comprehensive MRSA colonization     surveillance program. It is not     intended to diagnose MRSA     infection nor to guide or     monitor treatment for     MRSA infections.  MRSA PCR SCREENING     Status: None   Collection Time    12/13/12  7:59 PM      Result Value Range Status   MRSA by PCR NEGATIVE  NEGATIVE Final   Comment:            The GeneXpert MRSA Assay (FDA     approved for NASAL specimens     only), is one component of a  comprehensive MRSA colonization     surveillance program. It is not     intended to diagnose MRSA     infection nor to guide or     monitor treatment for     MRSA infections.  URINE CULTURE     Status: None   Collection Time    12/17/12 12:35 PM      Result Value Range Status   Specimen Description URINE, CLEAN CATCH   Final   Special Requests NONE   Final   Culture  Setup Time     Final   Value: 12/17/2012 21:56     Performed at [facility] Count     Final   Value: NO GROWTH     Performed at [facility]   Culture     Final   Value: NO GROWTH     Performed at [facility]   Report Status 12/18/2012 FINAL   Final   Medications:  Scheduled:  . atorvastatin  10 mg Oral QHS  . calcium-vitamin D  1 tablet Oral BID WC  . citalopram  20 mg Oral Daily  . cyanocobalamin  500 mcg Oral Daily  . dabigatran  75 mg Oral Q12H  . feeding supplement  237 mL Oral BID BM  . fentaNYL  100 mcg Transdermal Q72H  . fluconazole (DIFLUCAN) IV  100 mg Intravenous Q24H  . folic acid  0.5 mg Oral Daily  . gabapentin  100 mg Oral BID WC  . hydrocortisone sod succinate (SOLU-CORTEF) inj  50 mg Intravenous Q12H  . isosorbide mononitrate  15 mg Oral Daily  .  levothyroxine  25 mcg Oral Q0600  . magic mouthwash w/lidocaine  5 mL Oral QID  . metoprolol succinate  50 mg Oral BID  . pantoprazole  40 mg Oral Daily  . sodium chloride  3 mL Intravenous Q12H  . sucralfate  1 g Oral TID WC & HS  . vancomycin  500 mg Intravenous Q24H   Anti-infectives   Start     Dose/Rate Route Frequency Ordered Stop   12/20/12 1400  fluconazole (DIFLUCAN) IVPB 100 mg     100 mg 50 mL/hr over 60 Minutes Intravenous Every 24 hours 12/20/12 1303     12/20/12 1000  piperacillin-tazobactam (ZOSYN) IVPB 2.25 g  Status:  Discontinued     2.25 g 100 mL/hr over 30 Minutes Intravenous Every 6 hours 12/20/12 0842 12/20/12 1250   12/17/12 2000  vancomycin (VANCOCIN) 500 mg in sodium chloride 0.9 % 100 mL IVPB     500 mg 100 mL/hr over 60 Minutes Intravenous Every 24 hours 12/17/12 1052     12/15/12 1000  vancomycin (VANCOCIN) IVPB 750 mg/150 ml premix  Status:  Discontinued     750 mg 150 mL/hr over 60 Minutes Intravenous Every 24 hours 12/14/12 0951 12/17/12 1051   12/14/12 1000  vancomycin (VANCOCIN) IVPB 1000 mg/200 mL premix     1,000 mg 200 mL/hr over 60 Minutes Intravenous Every 24 hours 12/13/12 1232 12/14/12 1057   12/13/12 1700  piperacillin-tazobactam (ZOSYN) IVPB 3.375 g  Status:  Discontinued     3.375 g 12.5 mL/hr over 240 Minutes Intravenous Every 8 hours 12/13/12 1232 12/20/12 0842   12/13/12 0630  vancomycin (VANCOCIN) IVPB 1000 mg/200 mL premix     1,000 mg 200 mL/hr over 60 Minutes Intravenous  Once 12/13/12 0627 12/13/12 1104   12/13/12 0630  piperacillin-tazobactam (ZOSYN) IVPB 3.375 g  3.375 g 100 mL/hr over 30 Minutes Intravenous  Once 12/13/12 1610 12/13/12 1005     Assessment: 79 y/o F developed septic shock due to uncertain source and was placed on empiric vancomycin + Zosyn.   After diagnostic workup was completed the etiology is now thought to be cellulitis of LLE.    Currently on D#9 vancomycin 500 mg IV q24h, Zosyn discontinued yesterday    Vancomycin trough was within goal range on 8/20, since then Scr continues to rise, will repeat vancomycin trough and BMET tonight.   Fluconazole added 8/21 for oral thursh, Will decrease to 50 mg Q24h for CrCl 20 ml/min   Blood and urine cultures NG Final    Goal of Therapy:  Vancomycin trough level 15-20 mcg/ml  Plan:  1.) Vancomycin trough and BMET at 1930 tonight  2.) Recommend de-escalating abx asap given 9 days of vancomycin therapy and may be contributing to worsening renal function  3.) monitor renal function   Zared Knoth, Loma Messing PharmD Pager #: [phone] 8:47 AM 12/21/2012

## 2012-12-22 DIAGNOSIS — Z515 Encounter for palliative care: Secondary | ICD-10-CM

## 2012-12-22 DIAGNOSIS — F329 Major depressive disorder, single episode, unspecified: Secondary | ICD-10-CM

## 2012-12-22 MED ORDER — PREDNISONE 50 MG PO TABS
50.0000 mg | ORAL_TABLET | Freq: Every day | ORAL | Status: DC
  Administered 2012-12-22: 50 mg via ORAL
  Filled 2012-12-22 (×3): qty 1

## 2012-12-22 MED ORDER — FUROSEMIDE 10 MG/ML IJ SOLN
40.0000 mg | Freq: Once | INTRAMUSCULAR | Status: AC
  Administered 2012-12-22: 40 mg via INTRAVENOUS
  Filled 2012-12-22: qty 4

## 2012-12-22 NOTE — Consult Note (Addendum)
Patient Nicole Roberts      DOB: 26-Feb-1922      WUJ:[id number]     Consult Note from the Palliative Medicine Team at [facility]    Consult Requested by: Ina Kick, MD Reason for Consultation: Cherylann Ratel of [phone]  Assessment of patients Current state: 79 y.o. female with a PMH of atrial fibrillation, sinus syndrome status post permanent pacemaker in 2007, breast cancer status post lumpectomy, hypertension, hyperlipidemia, hypothyroidism and pulmonary hypertension who was recently treated for cellulitis, admitted again with cellulitis and developed septic shock, and now resolved. Patient has CHF with EF 45%, Patient was seen by physical therapy, and they recommended SNF. Discussed with son in detail, patient is DNR, no artificial nutrition with feeding tube, but he would like her to be treated and go to SNF for PT, if she does not do well or is readmitted in sepsis then he would consider hospice.  Goals of Care: 1.  Code Status: DNR   2. Scope of Treatment: 1. Vital Signs: per protocol  2. Respiratory/Oxygen:Continue as needed 3. Nutritional Support/Tube Feeds:No long term artificial nutrition with peg tube,continue with comfort feed with known aspiration risk 4. Antibiotics: Continue antibiotics as needed, but talk to family if patient again develops septic shock 5. Blood Products: Continue as needed 6. IVF: No long term IV fluids 7. Review of Medications to be discontinued: None 8. Labs: Continue as needed    4. Disposition: SNF       Patient Documents Completed or Given: Document Given Completed  Advanced Directives Pkt    MOST    DNR    Gone from My Sight    Hard Choices      Brief HPI: 79 y.o. female with a PMH of atrial fibrillation, sinus syndrome status post permanent pacemaker in 2007, breast cancer status post lumpectomy, hypertension, hyperlipidemia, hypothyroidism and pulmonary hypertension who was recently treated for cellulitis. A  review of her records reveals that she was hospitalized 09/16/2012-09/21/2012 for treatment of cellulitis and was ultimately discharged to a skilled nursing facility. She was also noted to have diarrhea during that hospitalization. Clostridium difficile studies were negative and she completed a course of 5 days of IV Zosyn and vancomycin. Doppler studies were negative for DVT. She was discharged home on an oral course of doxycycline and Cipro. She has also been under the care of gastroenterology for evaluation of weight loss and GI symptoms including intermittent nausea/vomiting/diarrhea. She underwent an EGD on 10/18/2012 which showed esophagitis for which she was treated with Carafate and PPI therapy. She presented to ED on 11/24/2012 with recurrent cellulitis for which she was given an IV dose of Cipro and sent home on oral doxycycline. She returned to the ED on 12/13/2012 with fever and ongoing complaints of left lower extremity erythema and tenderness. Upon admission, the patient was noted to be persistently hypotensive despite IV fluids and was transferred to the ICU and placed on pressor support. She subsequently improved and was transferred to the floor where she did well up until 12/19/2012 when she became increasingly confused and weak. No significant changes in mental status. Patient;s son told the attending that patient is DNR/DNI, and did not want feeding tube. Goals of care was done with patient's son as patient unable to participate due to delirium, she lacks decision making capacity.    ROS: Pain- denies Nausea- denies Appetite poor Insomnia- denies Anxiety- denies Depression- denies     PMH:  Past Medical History  Diagnosis Date  .  HEARING LOSS   . MITRAL VALVE INSUFF&AORTIC VALVE INSUFF     moderate MR  . PULMONARY HYPERTENSION   . Atrial fibrillation     permanent  . SICK SINUS SYNDROME     a. Tachybrady syndrome - Guidant PPM 10/2005.  . Edema 03/19/2009    due to venous  insufficiency, chronic lymphedema  . URINARY INCONTINENCE   . Rheumatoid arthritis(714.0) dx clarified 2011    On prednisone  . BREAST CANCER 12/2006    ductal ca s/p L lumpectomy  . Diastolic dysfunction   . HYPERTENSION   . HYPERLIPIDEMIA   . GERD   . HYPOTHYROIDISM   . Hyperparathyroidism     2 lobes removed  . Venous insufficiency     chronic BLE edema  . Spinal stenosis   . Anemia     a. Macrocytic - normal B12, folate 08/2011. b. 3/6 heme positive stools 10/2011 - per PCP note, elected against colonscopy.  [name] TIA (transient ischemic attack) 05/2011  . Anxiety      PSH: Past Surgical History  Procedure Laterality Date  . Pacemaker placement  2005    SSS and syncope  . Cholecystectomy  04/06/99  . Abdominal hysterectomy  1970    Partial  . Left hip arthroscopy  1995  . Right hip arthroscopy  01/2001  . Carpal tunnel release  1998    bilateral  . Breast surgery  12/2006    Left breast lupectomy- Ductal CA  . Left parathyroidectomy  07/2008  . Esophagogastroduodenoscopy N/A 10/18/2012    Procedure: ESOPHAGOGASTRODUODENOSCOPY (EGD);  Surgeon: Meryl Dare, MD;  Location: [facility];  Service: Endoscopy;  Laterality: N/A;   I have reviewed the FH and SH and  If appropriate update it with new information. Allergies  Allergen Reactions  . Sulfa Antibiotics Itching  . Tramadol Hcl Itching and Rash   Scheduled Meds: . atorvastatin  10 mg Oral QHS  . calcium-vitamin D  1 tablet Oral BID WC  . citalopram  20 mg Oral Daily  . cyanocobalamin  500 mcg Oral Daily  . dabigatran  75 mg Oral Q12H  . feeding supplement  237 mL Oral BID BM  . fentaNYL  100 mcg Transdermal Q72H  . fluconazole (DIFLUCAN) IV  50 mg Intravenous Q24H  . folic acid  0.5 mg Oral Daily  . gabapentin  100 mg Oral BID WC  . isosorbide mononitrate  15 mg Oral Daily  . levothyroxine  25 mcg Oral Q0600  . magic mouthwash w/lidocaine  5 mL Oral QID  . metoprolol succinate  50 mg Oral BID  . pantoprazole   40 mg Oral Daily  . predniSONE  50 mg Oral Q breakfast  . sodium chloride  3 mL Intravenous Q12H  . sucralfate  1 g Oral TID WC & HS  . [START ON 12/23/2012] vancomycin  750 mg Intravenous Q48H   Continuous Infusions:  PRN Meds:.sodium chloride, acetaminophen, alum & mag hydroxide-simeth, diphenoxylate-atropine, HYDROcodone-acetaminophen, loperamide, morphine injection, nitroGLYCERIN, ondansetron (ZOFRAN) IV, ondansetron, sodium chloride    BP 130/85  Pulse 67  Temp(Src) 97.3 F (36.3 C) (Axillary)  Resp 16  Ht 5' 4.17" (1.63 m)  Wt 73.4 kg (161 lb 13.1 oz)  BMI 27.63 kg/m2  SpO2 95%      Intake/Output Summary (Last 24 hours) at 12/22/12 1505 Last data filed at 12/22/12 0930  Gross per 24 hour  Intake    303 ml  Output    100 ml  Net  203 ml     Physical Exam:  General: appear in no acute distress HEENT:  NCAT Chest:   Clear bilaterally CVS: S1s2 RRR Abdomen: Soft, nontender Ext: Trace edema of the hands Neuro: AO x 1, lacks decision making capacity  Labs: CBC    Component Value Date/Time   WBC 12.2* 12/21/2012 1235   RBC 3.03* 12/21/2012 1235   HGB 10.6* 12/21/2012 1235   HCT 32.3* 12/21/2012 1235   PLT 160 12/21/2012 1235   MCV 106.6* 12/21/2012 1235   MCH 35.0* 12/21/2012 1235   MCHC 32.8 12/21/2012 1235   RDW 18.5* 12/21/2012 1235   LYMPHSABS 1.0 12/13/2012 2233   MONOABS 0.5 12/13/2012 2233   EOSABS 0.0 12/13/2012 2233   BASOSABS 0.0 12/13/2012 2233    BMET    Component Value Date/Time   NA 142 12/21/2012 1943   K 3.9 12/21/2012 1943   CL 109 12/21/2012 1943   CO2 22 12/21/2012 1943   GLUCOSE 114* 12/21/2012 1943   BUN 67* 12/21/2012 1943   CREATININE 1.82* 12/21/2012 1943   CALCIUM 8.8 12/21/2012 1943   GFRNONAA 24* 12/21/2012 1943   GFRAA 27* 12/21/2012 1943    CMP     Component Value Date/Time   NA 142 12/21/2012 1943   K 3.9 12/21/2012 1943   CL 109 12/21/2012 1943   CO2 22 12/21/2012 1943   GLUCOSE 114* 12/21/2012 1943   BUN 67* 12/21/2012 1943    CREATININE 1.82* 12/21/2012 1943   CALCIUM 8.8 12/21/2012 1943   PROT 6.2 12/13/2012 0600   ALBUMIN 3.2* 12/13/2012 0600   AST 20 12/13/2012 0600   ALT 18 12/13/2012 0600   ALKPHOS 59 12/13/2012 0600   BILITOT 0.5 12/13/2012 0600   GFRNONAA 24* 12/21/2012 1943   GFRAA 27* 12/21/2012 1943       Time In Time Out Total Time Spent with Patient Total Overall Time  2 pm 3 15 pm 60 min 90 min    Greater than 50%  of this time was spent counseling and coordinating care related to the above assessment and plan.

## 2012-12-22 NOTE — Progress Notes (Signed)
[facility] PROGRESS NOTE  Nicole Roberts WUJ:[id number] DOB: 1921/06/28 DOA: 12/13/2012 PCP: [name], MD  Brief narrative: 79 y.o. female with a PMH of atrial fibrillation, sinus syndrome status post permanent pacemaker in 2007, breast cancer status post lumpectomy, hypertension, hyperlipidemia, hypothyroidism and pulmonary hypertension who was recently treated for cellulitis. A review of her records reveals that she was hospitalized 09/16/2012-09/21/2012 for treatment of cellulitis and was ultimately discharged to a skilled nursing facility. She was also noted to have diarrhea during that hospitalization. Clostridium difficile studies were negative and she completed a course of 5 days of IV Zosyn and vancomycin. Doppler studies were negative for DVT. She was discharged home on an oral course of doxycycline and Cipro. She has also been under the care of gastroenterology for evaluation of weight loss and GI symptoms including intermittent nausea/vomiting/diarrhea. She underwent an EGD on 10/18/2012 which showed esophagitis for which she was treated with Carafate and PPI therapy. She presented to ED on 11/24/2012 with recurrent cellulitis for which she was given an IV dose of Cipro and sent home on oral doxycycline. She returned to the ED on 12/13/2012 with fever and ongoing complaints of left lower extremity erythema and tenderness. Upon admission, the patient was noted to be persistently hypotensive despite IV fluids and was transferred to the ICU and placed on pressor support. She subsequently improved and was transferred to the floor where she did well up until 12/19/2012 when she became increasingly confused and weak. No significant changes in mental status.  Hospital course is complicated due to worsening heart failure and volume overload with minimal improvement with lasix IV. Cardiology consulted. I have confirmed with the patient's son that the patient's code status is DNR/DNI. He also  informed me that she did not wish to be kept alive artificially and did not want any aggressive intervention which includes but is not limited to feeding tube placement if she ever needed it. Patient's son has agreed to palliative care consultation for goals of care.   Assessment/Plan:   Principal Problem:  Severe sepsis with septic shock secondary to left lower extremity cellulitis in the setting of chronic venous stasis/edema  - Likely secondary to left lower extremity cellulitis. Patient was initially on vancomycin and Zosyn. Zosyn stopped 12/20/2012 but we continued vancomycin. Initial Pro-calcitonin was 7.45 and trended down to 5.64--->3.14.  - Blood cultures to date are negative. Urine culture shows no growth.  - Please note that the pt moved to the ICU on 12/13/2012 secondary to persistent hypotension and elevated serum lactate necessitating pressor support. Lactic acid now within normal limits. Moved out of the ICU 12/16/12.  - per PT evaluation: SNF recommended.   Active Problems:  Thrombocytopenia  - secondary to pradaxa  - platelets now WNL HYPOTHYROIDISM  -Continue Synthroid 25 mcg daily  HYPERLIPIDEMIA  -Continue lipitor 10 mg at bedtime  Acute on chronic systolic CHF  - 2 D ECHO on this admission with EF 45% with akinesis of the mid-distal anteroseptal and apical myocardium, the study was not technically sufficient to allow evaluation of LV diastolic function.  - BNP markedly elevated at 7685. Diuretics were initially on hold secondary to hypotension. Chest x-ray done 12/19/2012 showed some pleural effusions but no frank pulmonary edema.  - volume overloaded on physical exam with JVD. Lasix given 40 mg IV 8/20through 8/22. Still edematous.  - last weight 12/13/2012 67.31 kg; weight 12/20/2012 is a 75.9 kg and subsequently 71.9 kg --> 73.4 kg. The weight measurement was inconsistent as  sometimes pt has unna boot and pillows. Now pt has no unnaboot and we will continue to monitor the  weight trend - Appreciate cardiology consultation and recommendations  - continue metoprolol and imdur  Macrocytic anemia  - May continue Folvite. Hemoglobin stable at 10.6  - Fecal occult blood testing was negative  Atrial fibrillation  - On chronic anticoagulation with Pradaxa.  - Developed rapid ventricular response on 12/15/2012, responded to one dose of IV metoprolol and resumption of usual home dose of Toprol-XL.  Stage III CKD (chronic kidney disease)  - Baseline creatinine variable but GFR ranges from 31-44. Creatinine continues to trend up which could be due to lasix too.  Yeast infection, oral thrush  - order placed for fluconazole 100 mg daily  Depression  - Continue Celexa. Note: Dosage reduced to 20 mg daily per pharmacy recommendations to avoid prolonged QTC in patients greater than age 41.  Hypokalemia/ hyperkalemia  - Repleted and the patient developed hyperkalemia. - Potassium now WNL Rheumatoid arthritis  - Hold Arava in the setting of acute infection.  - On stress dose steroids given chronic treatment with prednisone 5 mg daily. Start prednisone 50 mg daily with slow taper by 5 mg down to 0 mg  Code Status: DNR/DNI  Family Communication: (son Art at [phone]) at the bedside  Disposition Plan: SNF.   Medical Consultants:  Dr. Cyril Mourning, Critical care Palliative care team for goals of care Other Consultants:  None. Anti-infectives:  Vancomycin 12/13/2012--->  Zosyn 12/13/2012---> 12/20/2012  Fluconazole 12/20/2012 -->   Manson Passey, MD  [facility]  Pager [phone]   If 7PM-7AM, please contact night-coverage [email or web address] Password [facility] 12/22/2012, 6:28 AM   LOS: 9 days    HPI/Subjective: No acute overnight events. Problems with foley catheter leaking and unable to get accurate urine output.  Objective: Filed Vitals:   12/21/12 0539 12/21/12 1400 12/21/12 2200 12/22/12 0503  BP: 138/90 126/60 152/96 139/79  Pulse: 70 69 71 61  Temp: 97.3 F (36.3 C) 97.4  F (36.3 C) 97.8 F (36.6 C) 97.1 F (36.2 C)  TempSrc: Axillary Oral Oral Axillary  Resp: 16 18 18 16   Height:      Weight:    73.4 kg (161 lb 13.1 oz)  SpO2: 97% 93% 100% 95%    Intake/Output Summary (Last 24 hours) at 12/22/12 1914 Last data filed at 12/22/12 0600  Gross per 24 hour  Intake    500 ml  Output    400 ml  Net    100 ml    Exam:   General:  Pt is alert, not in acute distress, confused  Cardiovascular: paced rhythm, appreciate S1 and S2  Respiratory: coarse breath sounds, no wheezing  Abdomen: Soft, non tender, non distended, bowel sounds present, no guarding  Extremities: anasarca, LE edema improved and cellulitis has improved, pulses DP and PT palpable bilaterally  Neuro: Grossly nonfocal  Data Reviewed: Basic Metabolic Panel:  Recent Labs Lab 12/18/12 0405 12/19/12 0408 12/19/12 0820 12/20/12 0400 12/21/12 1235 12/21/12 1943  NA 140  --  137 141 144 142  K 4.9  --  5.8* 5.3* 4.4 3.9  CL 108  --  106 110 111 109  CO2 20  --  15* 20 20 22   GLUCOSE 93  --  102* 90 88 114*  BUN 45*  --  56* 62* 67* 67*  CREATININE 1.52* 1.74* 1.75* 1.86* 1.80* 1.82*  CALCIUM 8.5  --  8.8 8.7 9.1 8.8   Liver  Function Tests: No results found for this basename: AST, ALT, ALKPHOS, BILITOT, PROT, ALBUMIN,  in the last 168 hours No results found for this basename: LIPASE, AMYLASE,  in the last 168 hours No results found for this basename: AMMONIA,  in the last 168 hours CBC:  Recent Labs Lab 12/17/12 0405 12/18/12 0405 12/19/12 0820 12/20/12 0400 12/21/12 1235  WBC 9.7 7.3 7.8 9.0 12.2*  HGB 7.9* 7.9* 9.8* 9.1* 10.6*  HCT 24.1* 24.2* 30.5* 27.6* 32.3*  MCV 106.6* 108.0* 107.8* 105.7* 106.6*  PLT 103* 94* 121* 113* 160   Cardiac Enzymes: No results found for this basename: CKTOTAL, CKMB, CKMBINDEX, TROPONINI,  in the last 168 hours BNP: No components found with this basename: POCBNP,  CBG: No results found for this basename: GLUCAP,  in the last 168  hours  Recent Results (from the past 240 hour(s))  CULTURE, BLOOD (ROUTINE X 2)     Status: None   Collection Time    12/13/12  8:35 AM      Result Value Range Status   Specimen Description BLOOD RIGHT ANTECUBITAL   Final   Special Requests BOTTLES DRAWN AEROBIC ONLY 4CC   Final   Culture  Setup Time     Final   Value: 12/13/2012 10:22     Performed at [facility]   Culture     Final   Value: NO GROWTH 5 DAYS     Performed at [facility]   Report Status 12/19/2012 FINAL   Final  CULTURE, BLOOD (ROUTINE X 2)     Status: None   Collection Time    12/13/12  9:00 AM      Result Value Range Status   Specimen Description BLOOD RIGHT ANTECUBITAL   Final   Special Requests BOTTLES DRAWN AEROBIC AND ANAEROBIC 5CC   Final   Culture  Setup Time     Final   Value: 12/13/2012 10:22     Performed at [facility]   Culture     Final   Value: NO GROWTH 5 DAYS     Performed at [facility]   Report Status 12/19/2012 FINAL   Final  MRSA PCR SCREENING     Status: None   Collection Time    12/13/12 11:09 AM      Result Value Range Status   MRSA by PCR NEGATIVE  NEGATIVE Final   Comment:            The GeneXpert MRSA Assay (FDA     approved for NASAL specimens     only), is one component of a     comprehensive MRSA colonization     surveillance program. It is not     intended to diagnose MRSA     infection nor to guide or     monitor treatment for     MRSA infections.  MRSA PCR SCREENING     Status: None   Collection Time    12/13/12  7:59 PM      Result Value Range Status   MRSA by PCR NEGATIVE  NEGATIVE Final   Comment:            The GeneXpert MRSA Assay (FDA     approved for NASAL specimens     only), is one component of a     comprehensive MRSA colonization     surveillance program. It is not     intended to diagnose MRSA     infection  nor to guide or     monitor treatment for     MRSA infections.  URINE CULTURE     Status: None    Collection Time    12/17/12 12:35 PM      Result Value Range Status   Specimen Description URINE, CLEAN CATCH   Final   Special Requests NONE   Final   Culture  Setup Time     Final   Value: 12/17/2012 21:56     Performed at [facility] Count     Final   Value: NO GROWTH     Performed at [facility]   Culture     Final   Value: NO GROWTH     Performed at [facility]   Report Status 12/18/2012 FINAL   Final     Studies: No results found.  Scheduled Meds: . atorvastatin  10 mg Oral QHS  . calcium-vitamin D  1 tablet Oral BID WC  . citalopram  20 mg Oral Daily  . cyanocobalamin  500 mcg Oral Daily  . dabigatran  75 mg Oral Q12H  . feeding supplement  237 mL Oral BID BM  . fentaNYL  100 mcg Transdermal Q72H  . fluconazole (DIFLUCAN) IV  50 mg Intravenous Q24H  . folic acid  0.5 mg Oral Daily  . gabapentin  100 mg Oral BID WC  . hydrocortisone sod succinate (SOLU-CORTEF) inj  50 mg Intravenous Daily  . isosorbide mononitrate  15 mg Oral Daily  . levothyroxine  25 mcg Oral Q0600  . magic mouthwash w/lidocaine  5 mL Oral QID  . metoprolol succinate  50 mg Oral BID  . pantoprazole  40 mg Oral Daily  . sodium chloride  3 mL Intravenous Q12H  . sucralfate  1 g Oral TID WC & HS  . [START ON 12/23/2012] vancomycin  750 mg Intravenous Q48H

## 2012-12-22 NOTE — ED Provider Notes (Signed)
Medical screening examination/treatment/procedure(s) were performed by non-physician practitioner and as supervising physician I was immediately available for consultation/collaboration.  Raeford Razor, MD 12/22/12 [phone]

## 2012-12-22 NOTE — Progress Notes (Signed)
Dr. Elisabeth Pigeon aware via phone pt continues to void incontinently around catheter. Sediment noted in tube. See new orders received to dc foley catheter.

## 2012-12-22 NOTE — Progress Notes (Signed)
SUBJECTIVE:  No complaints of SOB  OBJECTIVE:   Vitals:   Filed Vitals:   12/21/12 0539 12/21/12 1400 12/21/12 2200 12/22/12 0503  BP: 138/90 126/60 152/96 139/79  Pulse: 70 69 71 61  Temp: 97.3 F (36.3 C) 97.4 F (36.3 C) 97.8 F (36.6 C) 97.1 F (36.2 C)  TempSrc: Axillary Oral Oral Axillary  Resp: 16 18 18 16   Height:      Weight:    73.4 kg (161 lb 13.1 oz)  SpO2: 97% 93% 100% 95%   I&O's:   Intake/Output Summary (Last 24 hours) at 12/22/12 0732 Last data filed at 12/22/12 0600  Gross per 24 hour  Intake    500 ml  Output    400 ml  Net    100 ml   TELEMETRY: Reviewed telemetry pt in atrial fibrillation     PHYSICAL EXAM General: Well developed, well nourished, in no acute distress Head: Eyes PERRLA, No xanthomas.   Normal cephalic and atramatic  Lungs:   Clear bilaterally anteriorly Heart:   Irregularly irregular S1 S2 Pulses are 2+ & equal. Abdomen: Bowel sounds are positive, abdomen soft and non-tender without masses Extremities:   3+ edema of feet Neuro: Alert and oriented X 3. Psych:  Good affect, responds appropriately   LABS: Basic Metabolic Panel:  Recent Labs  16/10/96 1235 12/21/12 1943  NA 144 142  K 4.4 3.9  CL 111 109  CO2 20 22  GLUCOSE 88 114*  BUN 67* 67*  CREATININE 1.80* 1.82*  CALCIUM 9.1 8.8   Liver Function Tests: No results found for this basename: AST, ALT, ALKPHOS, BILITOT, PROT, ALBUMIN,  in the last 72 hours No results found for this basename: LIPASE, AMYLASE,  in the last 72 hours CBC:  Recent Labs  12/20/12 0400 12/21/12 1235  WBC 9.0 12.2*  HGB 9.1* 10.6*  HCT 27.6* 32.3*  MCV 105.7* 106.6*  PLT 113* 160   Coag Panel:   Lab Results  Component Value Date   INR 1.27 03/19/2012   INR 2.1 05/16/2011   INR 1.7 04/27/2011    RADIOLOGY: Dg Chest 2 View  12/13/2012   *RADIOLOGY REPORT*  Clinical Data: Fever  CHEST - 2 VIEW  Comparison:  Study obtained earlier in the day and March 19, 2012  Findings:  There  is a degree of underlying emphysematous change. There is mild scarring bilaterally.  There is no edema or consolidation. Heart is mildly enlarged with normal pulmonary vascularity.  Pacemaker leads are attached to the right atrium and right ventricle.  There is degenerative change in the thoracic spine.  No adenopathy.  IMPRESSION: Underlying emphysematous change.  No edema or consolidation.   Original Report Authenticated By: Bretta Bang, M.D.   Dg Chest Port 1 View  12/19/2012   *RADIOLOGY REPORT*  Clinical Data: Congestive heart failure, pulmonary edema  PORTABLE CHEST - 1 VIEW  Comparison: Chest radiograph 12/13/2012  Findings: Left-sided pacemaker overlies stable enlarged heart silhouette.  There are chronic bronchitic markings.  Small effusions present.  No focal infiltrate.  No pneumothorax.  IMPRESSION: 1.  Mild cardiomegaly and bilateral effusions. 2.  Chronic bronchitic markings and scarring. 3.  No pulmonary edema or infiltrate.   Original Report Authenticated By: Genevive Bi, M.D.   Dg Chest Portable 1 View  12/13/2012   *RADIOLOGY REPORT*  Clinical Data: Bodyaches.  Shortness of breath.  Weakness.  PORTABLE CHEST - 1 VIEW  Comparison: Chest x-ray 03/19/2012.  Findings: Mild diffuse peribronchial cuffing.  No acute consolidative airspace disease.  No pleural effusions.  Lung volumes are normal.  No evidence of pulmonary edema.  Heart size cm borderline enlarged. The patient is rotated to the left on today's exam, resulting in distortion of the mediastinal contours and reduced diagnostic sensitivity and specificity for mediastinal pathology.  Atherosclerosis in the thoracic aorta.  Left-sided pacemaker device in place with lead tips projecting over the expected location of the right atrium and right ventricular apex.  IMPRESSION: 1.  Mild diffuse peribronchial cuffing may suggest bronchitis. 2.  Borderline cardiomegaly. 3.  Atherosclerosis.   Original Report Authenticated By: Trudie Reed, M.D.      ASSESSMENT:  1.  Acute on chronic systolic and diastolic CHF exacerbated by severe protein calorie malnutrition and immobility. 2.  Chronic renal insuff - stable 3.  Atrial fibrillation on Pradaxa and rate controlled  PLAN:   1.  Continue low dose IV Lasix as renal function tolerates 2.  Palliative Care consult in progress  Quintella Reichert, MD  12/22/2012  7:32 AM

## 2012-12-23 DIAGNOSIS — M069 Rheumatoid arthritis, unspecified: Secondary | ICD-10-CM

## 2012-12-23 LAB — CBC
MCH: 35.3 pg — ABNORMAL HIGH (ref 26.0–34.0)
MCHC: 33.5 g/dL (ref 30.0–36.0)
MCV: 105.4 fL — ABNORMAL HIGH (ref 78.0–100.0)
Platelets: 144 10*3/uL — ABNORMAL LOW (ref 150–400)
RBC: 2.58 MIL/uL — ABNORMAL LOW (ref 3.87–5.11)
RDW: 18.5 % — ABNORMAL HIGH (ref 11.5–15.5)

## 2012-12-23 MED ORDER — PREDNISONE 5 MG PO TABS
45.0000 mg | ORAL_TABLET | Freq: Every day | ORAL | Status: DC
  Administered 2012-12-23: 45 mg via ORAL
  Filled 2012-12-23 (×2): qty 1

## 2012-12-23 MED ORDER — PREDNISONE 20 MG PO TABS
40.0000 mg | ORAL_TABLET | Freq: Every day | ORAL | Status: AC
  Administered 2012-12-24: 40 mg via ORAL
  Filled 2012-12-23: qty 2

## 2012-12-23 MED ORDER — FUROSEMIDE 10 MG/ML IJ SOLN
20.0000 mg | Freq: Every day | INTRAMUSCULAR | Status: DC
  Administered 2012-12-23: 20 mg via INTRAVENOUS
  Filled 2012-12-23 (×2): qty 2

## 2012-12-23 NOTE — Progress Notes (Signed)
Patient ZO:[id number] Limes      DOB: [birth year]      WUJ:[id number]   Palliative Medicine Team at [facility] Progress Note    Subjective: Ms. Rhylee awakens easily to verbal stimuli. She describes no pain at this time. She states she is just weak. She is able to come me that the picture painted on the wall is from her "triplets" I affirmed with her son on the phone of these her great grandchildren. Patient's son expressed concern of her discharge in the a.m. and he feels she remains very weak and unable to rehabilitate well. I affirmed goals of care as desiring to pursue curative treatment with some rehabilitation. Patient was able to tell me that I needed to speak with her son. It has been reported that she has been confused but today appears to be reasonably oriented as affirmed with her son.  Filed Vitals:   12/23/12 1400  BP: 132/80  Pulse: 81  Temp: 97.5 F (36.4 C)  Resp: 18   Physical exam:   General: No acute distress very weak Pupils equal round reactive to light, extraocular muscles are intact mucous membranes are dry Chest is decreased but clear to auscultation and do not appreciate rhonchi rales or wheeze Cardiovascular regular rate rhythm positive S1 and S2 no S3 or S4 is noted Abdomen obese soft nontender nondistended with positive bowel sounds Extremities trace edema in her hands and 1+ in her feet Neurologically she is awake alert oriented to herself and to fax related to her family   Lab Results  Component Value Date   CREATININE 1.82* 12/21/2012   BUN 67* 12/21/2012   NA 142 12/21/2012   K 3.9 12/21/2012   CL 109 12/21/2012   CO2 22 12/21/2012    Assessment and plan: Patient is an 79 year old white female who up to this time has been ambulating with a walker at her facility and was able to balance her checkbook and help her son with her activities of daily living. Patient presented to the hospital with recurrent cellulitis after a course of oral Cipro and  doxycycline in May. Patient has been receiving evaluation for intermittent nausea vomiting and diarrhea and was diagnosed with esophagitis in June. Patient had developed fever in conjunction with increasing redness of the lower extremities. Goals of care at this time are expressed as curative in nature with a desire for rehabilitation.   1. DO NOT RESUSCITATE DO NOT INTUBATE 2. Cellulitis continue antibiotic therapy 3. Debility continue physical therapy and treatment of underlying disease processes 4. Pain secondary to cellulitis and a history of arthritis. Currently the patient's Ranae Plumber is on hold secondary to infection. She has had stress dose steroids, and a fentanyl patch was started on 12/15/2012. I do not see previous use of fentanyl and her history of low firmness with her son. Currently she appears to be tolerating the medication, however if her generalized weakness is related to this medication may need to consider discontinuation of returning back to oral medication. Will reevaluate in the a.m.   Total time 15 minutes with phone conversation to her son total time 25 minutes   Osei Anger L. Ladona Ridgel, MD MBA The Palliative Medicine Team at [facility] Phone: [phone] Pager: [phone]

## 2012-12-23 NOTE — Progress Notes (Signed)
Dr. Elisabeth Pigeon aware via phone lab unable to obtain all the ordered labs this am due to difficult stick and MD made aware IV site expired today. Also discussed some concerns of son with Dr. Elisabeth Pigeon regarding pt's care and discharge plans. See new orders received.

## 2012-12-23 NOTE — Progress Notes (Addendum)
SUBJECTIVE:  No complianst  OBJECTIVE:   Vitals:   Filed Vitals:   12/22/12 0920 12/22/12 1459 12/22/12 2112 12/23/12 0625  BP: 137/89 130/85 138/78 145/93  Pulse: 78 67 91 79  Temp:  97.3 F (36.3 C) 97.7 F (36.5 C) 97.8 F (36.6 C)  TempSrc:  Axillary Axillary Oral  Resp:  16  18  Height:      Weight:    72.8 kg (160 lb 7.9 oz)  SpO2:   97% 96%   I&O's:   Intake/Output Summary (Last 24 hours) at 12/23/12 0756 Last data filed at 12/22/12 0930  Gross per 24 hour  Intake    123 ml  Output      0 ml  Net    123 ml     PHYSICAL EXAM General: Well developed, well nourished, in no acute distress Lungs:   Clear bilaterally anteriorly Heart:   HRRR S1 S2 Pulses are 2+ & equal. Abdomen: Bowel sounds are positive, abdomen soft and non-tender without masses  Extremities:   2-3+ edema Neuro: Alert but sleepy Psych:  Good affect, responds appropriately   LABS: Basic Metabolic Panel:  Recent Labs  40/98/11 1235 12/21/12 1943  NA 144 142  K 4.4 3.9  CL 111 109  CO2 20 22  GLUCOSE 88 114*  BUN 67* 67*  CREATININE 1.80* 1.82*  CALCIUM 9.1 8.8   Liver Function Tests: No results found for this basename: AST, ALT, ALKPHOS, BILITOT, PROT, ALBUMIN,  in the last 72 hours No results found for this basename: LIPASE, AMYLASE,  in the last 72 hours CBC:  Recent Labs  12/21/12 1235  WBC 12.2*  HGB 10.6*  HCT 32.3*  MCV 106.6*  PLT 160   Coag Panel:   Lab Results  Component Value Date   INR 1.27 03/19/2012   INR 2.1 05/16/2011   INR 1.7 04/27/2011    RADIOLOGY: Dg Chest 2 View  12/13/2012   *RADIOLOGY REPORT*  Clinical Data: Fever  CHEST - 2 VIEW  Comparison:  Study obtained earlier in the day and March 19, 2012  Findings:  There is a degree of underlying emphysematous change. There is mild scarring bilaterally.  There is no edema or consolidation. Heart is mildly enlarged with normal pulmonary vascularity.  Pacemaker leads are attached to the right atrium and  right ventricle.  There is degenerative change in the thoracic spine.  No adenopathy.  IMPRESSION: Underlying emphysematous change.  No edema or consolidation.   Original Report Authenticated By: Bretta Bang, M.D.   Dg Chest Port 1 View  12/19/2012   *RADIOLOGY REPORT*  Clinical Data: Congestive heart failure, pulmonary edema  PORTABLE CHEST - 1 VIEW  Comparison: Chest radiograph 12/13/2012  Findings: Left-sided pacemaker overlies stable enlarged heart silhouette.  There are chronic bronchitic markings.  Small effusions present.  No focal infiltrate.  No pneumothorax.  IMPRESSION: 1.  Mild cardiomegaly and bilateral effusions. 2.  Chronic bronchitic markings and scarring. 3.  No pulmonary edema or infiltrate.   Original Report Authenticated By: Genevive Bi, M.D.   Dg Chest Portable 1 View  12/13/2012   *RADIOLOGY REPORT*  Clinical Data: Bodyaches.  Shortness of breath.  Weakness.  PORTABLE CHEST - 1 VIEW  Comparison: Chest x-ray 03/19/2012.  Findings: Mild diffuse peribronchial cuffing.  No acute consolidative airspace disease.  No pleural effusions.  Lung volumes are normal.  No evidence of pulmonary edema.  Heart size cm borderline enlarged. The patient is rotated to the left on today's  exam, resulting in distortion of the mediastinal contours and reduced diagnostic sensitivity and specificity for mediastinal pathology.  Atherosclerosis in the thoracic aorta.  Left-sided pacemaker device in place with lead tips projecting over the expected location of the right atrium and right ventricular apex.  IMPRESSION: 1.  Mild diffuse peribronchial cuffing may suggest bronchitis. 2.  Borderline cardiomegaly. 3.  Atherosclerosis.   Original Report Authenticated By: Trudie Reed, M.D.   ASSESSMENT:  1. Acute on chronic systolic and diastolic CHF exacerbated by severe protein calorie malnutrition and immobility. I&O's incomplete 2. Chronic renal insuff - stable  3. Atrial fibrillation on Pradaxa and rate  controlled  PLAN:  1. Continue low dose IV Lasix as renal function tolerates  2. Palliative Care consult in progress 3.  Check BMET today     Quintella Reichert, MD  12/23/2012  7:56 AM

## 2012-12-23 NOTE — Progress Notes (Addendum)
[facility] PROGRESS NOTE  Nicole Roberts WUJ:[id number] DOB: 16-Apr-1922 DOA: 12/13/2012 PCP: [name], MD  Brief narrative: 79 y.o. female with a PMH of atrial fibrillation, sinus syndrome status post permanent pacemaker in 2007, breast cancer status post lumpectomy, hypertension, hyperlipidemia, hypothyroidism and pulmonary hypertension who was recently treated for cellulitis. A review of her records reveals that she was hospitalized 09/16/2012-09/21/2012 for treatment of cellulitis and was ultimately discharged to a skilled nursing facility. She was also noted to have diarrhea during that hospitalization. Clostridium difficile studies were negative and she completed a course of 5 days of IV Zosyn and vancomycin. Doppler studies were negative for DVT. She was discharged home on an oral course of doxycycline and Cipro. She has also been under the care of gastroenterology for evaluation of weight loss and GI symptoms including intermittent nausea/vomiting/diarrhea. She underwent an EGD on 10/18/2012 which showed esophagitis for which she was treated with Carafate and PPI therapy. She presented to ED on 11/24/2012 with recurrent cellulitis for which she was given an IV dose of Cipro and sent home on oral doxycycline. She returned to the ED on 12/13/2012 with fever and ongoing complaints of left lower extremity erythema and tenderness. Upon admission, the patient was noted to be persistently hypotensive despite IV fluids and was transferred to the ICU and placed on pressor support. She subsequently improved and was transferred to the floor where she did well up until 12/19/2012 when she became increasingly confused and weak. No significant changes in mental status.  Hospital course is complicated due to worsening heart failure and volume overload with minimal improvement with lasix IV. Cardiology consulted. I have confirmed with the patient's son that the patient's code status is DNR/DNI. He also  informed me that she did not wish to be kept alive artificially and did not want any aggressive intervention which includes but is not limited to feeding tube placement if she ever needed it. Appreciate palliative care team and their recommendations.  Assessment/Plan:   Principal Problem:  Severe sepsis with septic shock secondary to left lower extremity cellulitis in the setting of chronic venous stasis/edema  - Likely secondary to left lower extremity cellulitis. Patient was initially on vancomycin and Zosyn. Zosyn stopped 12/20/2012 but we continued vancomycin. Initial Pro-calcitonin was 7.45 and trended down to 5.64--->3.14.  - Blood cultures to date are negative. Urine culture shows no growth.  - of note, pt moved to the ICU on 12/13/2012 secondary to persistent hypotension and elevated serum lactate necessitating pressor support. Lactic acid now within normal limits. Moved out of the ICU 12/16/12.  - per PT evaluation: SNF recommended.  Active Problems:  Thrombocytopenia  - secondary to pradaxa  - platelets now WNL  HYPOTHYROIDISM  -Continue Synthroid 25 mcg daily  HYPERLIPIDEMIA  -Continue lipitor 10 mg at bedtime  Acute on chronic systolic CHF  - 2 D ECHO on this admission with EF 45% with akinesis of the mid-distal anteroseptal and apical myocardium, the study was not technically sufficient to allow evaluation of LV diastolic function.  - BNP markedly elevated at 7685. Diuretics were initially on hold secondary to hypotension. Chest x-ray done 12/19/2012 showed some pleural effusions but no frank pulmonary edema.  - Lasix given 40 mg IV 8/20 through 8/22. Still edematous.  - Weight 12/13/2012 67.31 kg; weight 12/20/2012 is a 75.9 kg and subsequently 71.9 kg --> 73.4 kg --> 72.8 kg - will continue low dose lasix, 20 mg IV daily - Appreciate cardiology following - continue metoprolol and  imdur  Macrocytic anemia  - May continue Naylor. Hemoglobin stable at 10.6  - Fecal occult blood  testing was negative  Atrial fibrillation  - On chronic anticoagulation with Pradaxa.  - Developed rapid ventricular response on 12/15/2012, responded to one dose of IV metoprolol and resumption of usual home dose of Toprol-XL.  Stage III CKD (chronic kidney disease)  - Baseline creatinine variable but GFR ranges from 31-44. Creatinine continues to trend up which could be due to lasix too.  Yeast infection, oral thrush  - continue fluconazole 100 mg daily  Depression  - Continue Celexa. Note: Dosage reduced to 20 mg daily per pharmacy recommendations to avoid prolonged QTC in patients greater than age 59.  Hypokalemia/ hyperkalemia  - Repleted and the patient developed hyperkalemia.  - Potassium now WNL  Rheumatoid arthritis  - Hold Arava in the setting of acute infection.  - On stress dose steroids given chronic treatment with prednisone 5 mg daily. Slow prednisone taper, 45 mg today   Code Status: DNR/DNI  Family Communication: (son Art at [phone]) at the bedside  Disposition Plan: SNF.    Medical Consultants:  Dr. Cyril Mourning, Critical care  Palliative care team for goals of care Other Consultants:  None. Anti-infectives:  Vancomycin 12/13/2012--->  Zosyn 12/13/2012---> 12/20/2012  Fluconazole 12/20/2012 -->   Manson Passey, MD  [facility]  Pager [phone]   If 7PM-7AM, please contact night-coverage [email or web address] Password [facility] 12/23/2012, 7:54 AM   LOS: 10 days    HPI/Subjective: No acute overnight events.  Objective: Filed Vitals:   12/22/12 0920 12/22/12 1459 12/22/12 2112 12/23/12 0625  BP: 137/89 130/85 138/78 145/93  Pulse: 78 67 91 79  Temp:  97.3 F (36.3 C) 97.7 F (36.5 C) 97.8 F (36.6 C)  TempSrc:  Axillary Axillary Oral  Resp:  16  18  Height:      Weight:    72.8 kg (160 lb 7.9 oz)  SpO2:   97% 96%    Intake/Output Summary (Last 24 hours) at 12/23/12 0754 Last data filed at 12/22/12 0930  Gross per 24 hour  Intake    123 ml  Output      0 ml   Net    123 ml    Exam:   General:  Pt is sleeping, not in acute distress  Cardiovascular: S1/S2 appreciated, paced rhythm  Respiratory: basilar crackles, no wheezing  Abdomen: Soft, non tender, non distended, bowel sounds present, no guarding  Extremities: anasarca, pulses DP and PT palpable bilaterally  Neuro: Grossly nonfocal  Data Reviewed: Basic Metabolic Panel:  Recent Labs Lab 12/18/12 0405 12/19/12 0408 12/19/12 0820 12/20/12 0400 12/21/12 1235 12/21/12 1943  NA 140  --  137 141 144 142  K 4.9  --  5.8* 5.3* 4.4 3.9  CL 108  --  106 110 111 109  CO2 20  --  15* 20 20 22   GLUCOSE 93  --  102* 90 88 114*  BUN 45*  --  56* 62* 67* 67*  CREATININE 1.52* 1.74* 1.75* 1.86* 1.80* 1.82*  CALCIUM 8.5  --  8.8 8.7 9.1 8.8   CBC:  Recent Labs Lab 12/17/12 0405 12/18/12 0405 12/19/12 0820 12/20/12 0400 12/21/12 1235  WBC 9.7 7.3 7.8 9.0 12.2*  HGB 7.9* 7.9* 9.8* 9.1* 10.6*  HCT 24.1* 24.2* 30.5* 27.6* 32.3*  MCV 106.6* 108.0* 107.8* 105.7* 106.6*  PLT 103* 94* 121* 113* 160    CULTURE, BLOOD (ROUTINE X 2)     Status:  None   Collection Time    12/13/12  8:35 AM      Result Value Range Status   Specimen Description BLOOD RIGHT ANTECUBITAL   Final   Special Requests BOTTLES DRAWN AEROBIC ONLY 4CC   Final   Culture  Setup Time     Final   Value: 12/13/2012 10:22     Performed at [facility]   Culture     Final   Value: NO GROWTH 5 DAYS     Performed at [facility]   Report Status 12/19/2012 FINAL   Final  CULTURE, BLOOD (ROUTINE X 2)     Status: None   Collection Time    12/13/12  9:00 AM      Result Value Range Status   Specimen Description BLOOD RIGHT ANTECUBITAL   Final   Special Requests BOTTLES DRAWN AEROBIC AND ANAEROBIC 5CC   Final   Culture  Setup Time     Final   Value: 12/13/2012 10:22     Performed at [facility]   Culture     Final   Value: NO GROWTH 5 DAYS     Performed at [facility]   Report  Status 12/19/2012 FINAL   Final  MRSA PCR SCREENING     Status: None   Collection Time    12/13/12  7:59 PM      Result Value Range Status   MRSA by PCR NEGATIVE  NEGATIVE Final  URINE CULTURE     Status: None   Collection Time    12/17/12 12:35 PM      Result Value Range Status   Specimen Description URINE, CLEAN CATCH   Final   Special Requests NONE   Final   Culture  Setup Time     Final   Value: 12/17/2012 21:56     Performed at [facility] Count     Final   Value: NO GROWTH     Performed at [facility]   Culture     Final   Value: NO GROWTH     Performed at [facility]   Report Status 12/18/2012 FINAL   Final     Studies: No results found.  Scheduled Meds: . atorvastatin  10 mg Oral QHS  . calcium-vitamin D  1 tablet Oral BID WC  . citalopram  20 mg Oral Daily  . cyanocobalamin  500 mcg Oral Daily  . dabigatran  75 mg Oral Q12H  . feeding supplement  237 mL Oral BID BM  . fentaNYL  100 mcg Transdermal Q72H  . fluconazole (DIFLUCAN)   50 mg Intravenous Q24H  . folic acid  0.5 mg Oral Daily  . gabapentin  100 mg Oral BID WC  . isosorbide mononitrate  15 mg Oral Daily  . levothyroxine  25 mcg Oral Q0600  . magic mouthwash  5 mL Oral QID  . metoprolol succinate  50 mg Oral BID  . pantoprazole  40 mg Oral Daily  . predniSONE  50 mg Oral Q breakfast  . sucralfate  1 g Oral TID WC & HS  . vancomycin  750 mg Intravenous Q48H

## 2012-12-24 DIAGNOSIS — E875 Hyperkalemia: Secondary | ICD-10-CM

## 2012-12-24 DIAGNOSIS — I4891 Unspecified atrial fibrillation: Secondary | ICD-10-CM

## 2012-12-24 MED ORDER — CITALOPRAM HYDROBROMIDE 20 MG PO TABS: 20.0000 mg | ORAL_TABLET | Freq: Every day | ORAL | Status: DC

## 2012-12-24 MED ORDER — FUROSEMIDE 20 MG PO TABS
20.0000 mg | ORAL_TABLET | Freq: Every day | ORAL | Status: DC
  Administered 2012-12-24: 20 mg via ORAL
  Filled 2012-12-24: qty 1

## 2012-12-24 MED ORDER — FUROSEMIDE 20 MG PO TABS: 20.0000 mg | ORAL_TABLET | Freq: Every day | ORAL | Status: DC

## 2012-12-24 MED ORDER — ALUM & MAG HYDROXIDE-SIMETH 200-200-20 MG/5ML PO SUSP: 30.0000 mL | Freq: Four times a day (QID) | ORAL | Status: DC | PRN

## 2012-12-24 MED ORDER — ENSURE COMPLETE PO LIQD: 237.0000 mL | Freq: Two times a day (BID) | ORAL | Status: DC

## 2012-12-24 MED ORDER — MAGIC MOUTHWASH W/LIDOCAINE: 5.0000 mL | Freq: Four times a day (QID) | ORAL | Status: DC

## 2012-12-24 MED ORDER — HYDROCODONE-ACETAMINOPHEN 5-325 MG PO TABS: 1.0000 | ORAL_TABLET | Freq: Four times a day (QID) | ORAL | Status: DC | PRN

## 2012-12-24 MED ORDER — ISOSORBIDE MONONITRATE 15 MG HALF TABLET: 15.0000 mg | ORAL_TABLET | Freq: Every day | ORAL | Status: DC

## 2012-12-24 MED ORDER — PREDNISONE 5 MG PO TABS: 35.0000 mg | ORAL_TABLET | Freq: Every day | ORAL | Status: DC

## 2012-12-24 MED ORDER — FLUCONAZOLE 50 MG PO TABS: 50.0000 mg | ORAL_TABLET | Freq: Every day | ORAL | Status: DC

## 2012-12-24 MED ORDER — CITALOPRAM HYDROBROMIDE 40 MG PO TABS: 40.0000 mg | ORAL_TABLET | Freq: Every day | ORAL | Status: DC

## 2012-12-24 NOTE — Progress Notes (Signed)
Physical Therapy Treatment Patient Details Name: Nicole Roberts MRN: [id number] DOB: October 06, 1921 Today's Date: 12/24/2012 Time: 4098-1191 PT Time Calculation (min): 42 min  PT Assessment / Plan / Recommendation  History of Present Illness     PT Comments   Son asked if PT could assess pt's ability to use call bell. Pt is not able to use standard- practiced using a simulated soft touch. Recommended to son to request soft touch call bell at SNF. If pt remains in hospital, recommend getting a soft touch. Pt is much improved in sitting balance and cognition. Incontinent of loose BM  Follow Up Recommendations  SNF     Does the patient have the potential to tolerate intense rehabilitation     Barriers to Discharge        Equipment Recommendations  None recommended by PT    Recommendations for Other Services    Frequency Min 3X/week   Progress towards PT Goals Progress towards PT goals: Progressing toward goals  Plan Current plan remains appropriate    Precautions / Restrictions Precautions Precautions: Fall Precaution Comments: incontinence. Required Braces or Orthoses: Other Brace/Splint Other Brace/Splint: has bil. PRAFO   Pertinent Vitals/Pain Noted Wheezing  During activity.  Did not ck sats. Pt on 3 l [place]    Mobility  Bed Mobility Rolling Right: 1: +2 Total assist Rolling Right: Patient Percentage: 10% Rolling Left: 1: +2 Total assist Rolling Left: Patient Percentage: 10% Supine to Sit: 1: +2 Total assist Supine to Sit: Patient Percentage: 20% Sit to Supine: 1: +2 Total assist Sit to Supine: Patient Percentage: 0% Details for Bed Mobility Assistance: HOB elevated 50 degrees with + 2 total assist and pad to swival hips to EOB with pt unable to assist due to increased edema throughout and increased weakness Transfers Transfers: Not assessed    Exercises General Exercises - Upper Extremity Shoulder ABduction: AAROM;Both;5 reps Elbow Flexion: AAROM;Both;10 reps Other  Exercises Other Exercises: worked on weight bear thr=ough Both feet to increase dorsiflexion.    PT Diagnosis:    PT Problem List:   PT Treatment Interventions:     PT Goals (current goals can now be found in the care plan section)    Visit Information  Last PT Received On: 12/24/12 Assistance Needed: +2    Subjective Data      Cognition  Cognition Arousal/Alertness: Awake/alert Overall Cognitive Status: Impaired/Different from baseline Area of Impairment: Memory General Comments: much improved cognition. Able to give info re: wears SAS, walks with 4 wheeled RW    Balance  Static Sitting Balance Static Sitting - Balance Support: Bilateral upper extremity supported Static Sitting - Comment/# of Minutes: sat x 10 minutes, able to hold her static and dynamic today. Dynamic Sitting Balance Dynamic Sitting - Balance Support: Bilateral upper extremity supported Dynamic Sitting - Level of Assistance: 5: Stand by assistance Dynamic Sitting - Comments: Pt practiced leaning forward, backward, side to side. Maintained control during activity.  End of Session PT - End of Session Equipment Utilized During Treatment: Oxygen Activity Tolerance: Patient tolerated treatment well Patient left: in bed;with call bell/phone within reach;with family/visitor present Nurse Communication: Need for lift equipment   GP     Rada Hay 12/24/2012, 11:52 AM Blanchard Kelch PT [phone]

## 2012-12-24 NOTE — Progress Notes (Addendum)
Filed Vitals:   12/23/12 0625 12/23/12 1400 12/23/12 2107 12/24/12 0500  BP: 145/93 132/80 139/75 124/70  Pulse: 79 81 76 70  Temp: 97.8 F (36.6 C) 97.5 F (36.4 C) 97.8 F (36.6 C) 97.3 F (36.3 C)  TempSrc: Oral Oral Oral Oral  Resp: 18 18 16 18   Height:      Weight: 160 lb 7.9 oz (72.8 kg)   156 lb 8.4 oz (71 kg)  SpO2: 96% 98%  100%    Intake/Output Summary (Last 24 hours) at 12/24/12 8295 Last data filed at 12/24/12 6213  Gross per 24 hour  Intake    345 ml  Output   1950 ml  Net  -1605 ml    SUBJECTIVE Patient states she feels well. Wants to get out of here. No SOB.  LABS: Basic Metabolic Panel:  Recent Labs  08/65/78 1235 12/21/12 1943  NA 144 142  K 4.4 3.9  CL 111 109  CO2 20 22  GLUCOSE 88 114*  BUN 67* 67*  CREATININE 1.80* 1.82*  CALCIUM 9.1 8.8   CBC:  Recent Labs  12/21/12 1235 12/23/12 0830  WBC 12.2* 13.6*  HGB 10.6* 9.1*  HCT 32.3* 27.2*  MCV 106.6* 105.4*  PLT 160 144*    Radiology/Studies:    Dg Chest Port 1 View  12/19/2012   *RADIOLOGY REPORT*  Clinical Data: Congestive heart failure, pulmonary edema  PORTABLE CHEST - 1 VIEW  Comparison: Chest radiograph 12/13/2012  Findings: Left-sided pacemaker overlies stable enlarged heart silhouette.  There are chronic bronchitic markings.  Small effusions present.  No focal infiltrate.  No pneumothorax.  IMPRESSION: 1.  Mild cardiomegaly and bilateral effusions. 2.  Chronic bronchitic markings and scarring. 3.  No pulmonary edema or infiltrate.   Original Report Authenticated By: Genevive Bi, M.D.   Ecg 12/20/12 ventricular paced rhythm. Rate 62 bpm.  Echo:Study Conclusions  - Left ventricle: The cavity size was normal. There was mild concentric hypertrophy. Systolic function was mildly to moderately reduced. The estimated ejection fraction was approximately 45%. There is akinesis of the mid-distalanteroseptal and apical myocardium. The study is not technically sufficient to  allow evaluation of LV diastolic function. Rhythm appears to be atrial fibrillation. - Aortic valve: Trileaflet; mildly calcified leaflets. No significant regurgitation. - Mitral valve: Mildly calcified annulus. Mild regurgitation. - Left atrium: The atrium was moderately dilated. - Right ventricle: Pacer wire or catheter noted in right ventricle. - Right atrium: The atrium was moderately dilated. Central venous pressure: 15mm Hg (est). - Tricuspid valve: Moderate regurgitation. - Pulmonary arteries: Systolic pressure was moderately increased. PA peak pressure: 48mm Hg (S). - Pericardium, extracardiac: There was no pericardial effusion. Impressions:  - Comparison to prior study December 2013. There is mild LVH with LVEF approximately 45%, mid to distal anteroseptal akinesis noted - new compared to prior study. Indeterminate diastolic function. Moderate biatrial enlargement. Mild mitral and moderate tricuspid regurgitation. Pacer wire in right heart. PASP 48 mmHg and CVP elevated.   PHYSICAL EXAM General: elderly, frail, in no acute distress. Head: Normal Neck: Negative for carotid bruits. JVD not elevated. Lungs: Clear bilaterally anteriorly. Heart: RRR S1 S2 without murmurs, rubs, or gallops.  Abdomen: Soft, non-tender, non-distended with normoactive bowel sounds. No hepatomegaly. No rebound/guarding. No obvious abdominal masses. Msk:  Strength and tone appears normal for age. Extremities: 2+ bilateral edema especially in feet and toes.  Distal pedal pulses are 2+ and equal bilaterally. Neuro: Alert and oriented X 3. Moves all extremities spontaneously.  Psych:  Responds to questions appropriately with a normal affect.  ASSESSMENT AND PLAN: 1. Acute on chronic combined systolic and diastolic CHF. EF 45%. Diuresing well on low dose IV lasix. I/O negative 1600 cc. Weight is down. Will transition to po lasix today. 2. Chronic atrial fibrillation. Rate controlled on metoprolol.  Anticoagulated on Pradaxa. 3. Pulmonary HTN 4. S/p pacemaker. 5. LE cellulitis and venous insufficiency. 6. CKD stage 3.  7. Ischemic heart disease with regional wall motion abnormality. No anginal symptoms. Continue metoprolol and nitrates. Principal Problem:   Severe sepsis with septic shock Active Problems:   HYPOTHYROIDISM   HYPERLIPIDEMIA   HYPERTENSION   Acute on chronic systolic CHF (congestive heart failure)   GERD   Rheumatoid arthritis(714.0)   Edema   Macrocytic anemia   Atrial fibrillation   Stage III chronic kidney disease   Left lower extremity cellulitis in the setting of chronic venous stasis/edema   Depression   Hypokalemia   Thrombocytopenia, unspecified   Hyperkalemia    Signed, Aiya Keach [place] MD,FACC 12/24/2012 7:20 AM

## 2012-12-24 NOTE — Progress Notes (Signed)
Report called and given to Supervisor Charge Nurse Pattricia Boss, patient is stable with vital signs, last bowel movement today, foley removed also prior to transport Stanford Breed RN 12-24-2012 14:16pm

## 2012-12-24 NOTE — Discharge Summary (Signed)
Physician Discharge Summary  Nicole Roberts ZOX:[id number] DOB: August 18, 1921 DOA: 12/13/2012  PCP: [name], MD  Admit date: 12/13/2012 Discharge date: 12/24/2012  Recommendations for Outpatient Follow-up:  1. please continue taking Lasix with adjustment to 20 mg daily for generalized swelling. Please use caution as kidney function can worsen with this medication.   2. because you're taking lower dosage of Lasix we have recommended taking lower dosage of potassium supplementation, 10 mEq daily instead of 20 mEq daily.  3. monitor renal function at least every 2 weeks. Creatinine at the time of discharge is 1.82.  4. You are on Pradaxa FYI: per package insert, Patients > 80 years: Use with extreme caution or consider other treatment options; no dosage adjustment provided in manufacturer's labeling; however, numerous cases of hemorrhage, including hemorrhagic stroke, have been reported postmarketing, particularly in this age group of octogenarians. Due to a lack of available dosing options available in the [place] consider avoiding use of dabigatran in this population. Since patient has been on that as prescribed by primary care physician we will continue this medication please make note of the information above. Cardiology did not recomend to discontinue this medication  5. you may continue either Lomotil or Imodium for diarrhea. Please do not use both medications either one or the other which ever one controls symptoms better.  6. continue taking Sucralfate suspension as prescribed  7. please continue taking prednisone taper, the next dose is 35 mg 12/25/2012. Then, taper by 5 mg a day (so 30 mg 8/27, 25 mg 8/28 etc..) until you get to  the dosage of 5 mg a day which you can continue indefinitely as a low-dose steroids for rheumatoid arthritis.  8. we stopped lisinopril due to acute renal failure.  9. Continue fluconazole 50 gm daily for another 5 days on discharge for urinary tract  infection  10. You have completed vancomycin for treatment of lower extremity cellulitis and you do not need another antibiotic for cellulitis at this time.  11. Hold Arava in the setting of acute infection. May restart once infection (UTI) resolved.   Discharge Diagnoses:  Principal Problem:   Severe sepsis with septic shock Active Problems:   HYPOTHYROIDISM   HYPERLIPIDEMIA   HYPERTENSION   Acute on chronic systolic CHF (congestive heart failure)   GERD   Rheumatoid arthritis(714.0)   Edema   Macrocytic anemia   Atrial fibrillation   Stage III chronic kidney disease   Left lower extremity cellulitis in the setting of chronic venous stasis/edema   Depression   Hypokalemia   Thrombocytopenia, unspecified   Hyperkalemia    Discharge Condition: medically stable for discharge to SNF today; pt son agrees with the discharge plan  Diet recommendation: as tolerated  History of present illness:  79 y.o. female with a PMH of atrial fibrillation, sinus syndrome status post permanent pacemaker in 2007, breast cancer status post lumpectomy, hypertension, hyperlipidemia, hypothyroidism and pulmonary hypertension who was recently treated for cellulitis. A review of her records reveals that she was hospitalized 09/16/2012-09/21/2012 for treatment of cellulitis and was ultimately discharged to a skilled nursing facility. She was also noted to have diarrhea during that hospitalization. Clostridium difficile studies were negative and she completed a course of 5 days of IV Zosyn and vancomycin. Doppler studies were negative for DVT. She was discharged home on an oral course of doxycycline and Cipro. She has also been under the care of gastroenterology for evaluation of weight loss and GI symptoms including intermittent nausea/vomiting/diarrhea. She underwent an  EGD on 10/18/2012 which showed esophagitis for which she was treated with Carafate and PPI therapy. She presented to ED on 11/24/2012 with  recurrent cellulitis for which she was given an IV dose of Cipro and sent home on oral doxycycline. She returned to the ED on 12/13/2012 with fever and ongoing complaints of left lower extremity erythema and tenderness. Upon admission, the patient was noted to be persistently hypotensive despite IV fluids and was transferred to the ICU and placed on pressor support. She subsequently improved and was transferred to the floor where she did well up until 12/19/2012 when she became increasingly confused and weak. No significant changes in mental status.  Hospital course is complicated due to worsening heart failure and volume overload with minimal improvement with lasix IV. Cardiology consulted. I have confirmed with the patient's son that the patient's code status is DNR/DNI. He also informed me that she did not wish to be kept alive artificially and did not want any aggressive intervention which includes but is not limited to feeding tube placement if she ever needed it. Appreciate palliative care team and their recommendations during pt hospital stay.  Assessment/Plan:   Principal Problem:  Severe sepsis with septic shock secondary to left lower extremity cellulitis in the setting of chronic venous stasis/edema  - Likely secondary to left lower extremity cellulitis. Patient was initially on vancomycin and Zosyn. Zosyn stopped 12/20/2012 but we continued vancomycin. Initial Pro-calcitonin was 7.45 and trended down to 5.64--->3.14.  -  We will stop the vancomycin prior to discharge today. - Blood cultures to date are negative. Urine culture shows no growth.  - of note, pt moved to the ICU on 12/13/2012 secondary to persistent hypotension and elevated serum lactate necessitating pressor support. Lactic acid now within normal limits. Moved out of the ICU 12/16/12.  - per PT evaluation: SNF recommended. Patient's son agrees with the discharge plan to skilled nursing facility today. Active Problems:   Thrombocytopenia  - secondary to pradaxa  - platelets now WNL  HYPOTHYROIDISM  -Continue Synthroid 25 mcg daily  HYPERLIPIDEMIA  -Continue lipitor 10 mg at bedtime  Acute on chronic systolic CHF  - 2 D ECHO on this admission with EF 45% with akinesis of the mid-distal anteroseptal and apical myocardium, the study was not technically sufficient to allow evaluation of LV diastolic function.  - BNP markedly elevated at 7685. Diuretics were initially on hold secondary to hypotension. Chest x-ray done 12/19/2012 showed some pleural effusions but no frank pulmonary edema.  - Lasix given 40 mg IV 8/20 through 8/22. Still edematous but improved - Weight 12/13/2012 67.31 kg; weight 12/20/2012 is a 75.9 kg and subsequently 71.9 kg --> 73.4 kg --> 72.8 kg  - will continue low dose lasix, 20 mg by mouth daily. Continue potassium supplementation, 10 mEq by mouth daily - Appreciate cardiology following during this hospital stay - Continue metoprolol and imdur  Macrocytic anemia  - May continue Folvite. Hemoglobin stable at 10.6  - Fecal occult blood testing was negative  Atrial fibrillation  - On chronic anticoagulation with Pradaxa.  - Developed rapid ventricular response on 12/15/2012, responded to one dose of IV metoprolol and resumption of usual home dose of Toprol-XL 50 mg twice daily  Stage III CKD (chronic kidney disease)  - Baseline creatinine variable but GFR ranges from 31-44. Creatinine continues to trend up which could be due to lasix too.  Yeast infection, oral thrush  - continue fluconazole 50 mg daily for additional 5 days on  discharge Depression  - Continue Celexa. Note: Dosage reduced to 20 mg daily per pharmacy recommendations to avoid prolonged QTC in patients greater than age 3.  Hypokalemia/ hyperkalemia  - Repleted and the patient developed hyperkalemia.  - Potassium now WNL  Rheumatoid arthritis  - Hold Arava in the setting of acute infection.  - On stress dose steroids given  chronic treatment with prednisone 5 mg daily. Slow prednisone taper as noted above  Code Status: DNR/DNI  Family Communication: (son Art at [phone]) at the bedside    Medical Consultants:  Dr. Cyril Mourning, Critical care  Palliative care team for goals of care Other Consultants:  None. Anti-infectives:  Vancomycin 12/13/2012---> 12/24/2012 Zosyn 12/13/2012---> 12/20/2012  Fluconazole 12/20/2012 --> for 5 more days on discharge    Manson Passey, MD  [facility]  Pager [phone]   Discharge Exam: Filed Vitals:   12/24/12 0500  BP: 124/70  Pulse: 70  Temp: 97.3 F (36.3 C)  Resp: 18   Filed Vitals:   12/23/12 0625 12/23/12 1400 12/23/12 2107 12/24/12 0500  BP: 145/93 132/80 139/75 124/70  Pulse: 79 81 76 70  Temp: 97.8 F (36.6 C) 97.5 F (36.4 C) 97.8 F (36.6 C) 97.3 F (36.3 C)  TempSrc: Oral Oral Oral Oral  Resp: 18 18 16 18   Height:      Weight: 72.8 kg (160 lb 7.9 oz)   71 kg (156 lb 8.4 oz)  SpO2: 96% 98%  100%    General: Pt is alert, follows commands appropriately, not in acute distress Cardiovascular: paced rhythm, S1/S2 appreciated Respiratory: Clear to auscultation bilaterally, no wheezing, no crackles, no rhonchi Abdominal: Soft, non tender, non distended, bowel sounds +, no guarding Extremities: anasarca, pulses palpable bilaterally DP and PT Neuro: Grossly nonfocal  Discharge Instructions  Discharge Orders   Future Appointments Provider Department Dept Phone   01/30/2013 1:45 PM Newt Lukes, MD [facility] [phone]   Future Orders Complete By Expires   (HEART FAILURE PATIENTS) Call MD:  Anytime you have any of the following symptoms: 1) 3 pound weight gain in 24 hours or 5 pounds in 1 week 2) shortness of breath, with or without a dry hacking cough 3) swelling in the hands, feet or stomach 4) if you have to sleep on extra pillows at night in order to breathe.  As directed    Scheduling Instructions:     1. Pt will continue  Lasix 20 mg daily in addition to potassium supplementation 10 mEq daily 2. patient will continue metoprolol 50 mg twice daily and Imdur 15 mg daily   Call MD for:  difficulty breathing, headache or visual disturbances  As directed    Call MD for:  persistant dizziness or light-headedness  As directed    Call MD for:  persistant nausea and vomiting  As directed    Call MD for:  severe uncontrolled pain  As directed    Diet - low sodium heart healthy  As directed    Discharge instructions  As directed    Comments:     1. please continue taking Lasix with adjustment to 20 mg daily for generalized swelling. Please use caution as kidney function can worsen with this medication.  2. because you're taking lower dosage of Lasix we have recommended taking lower dosage of potassium supplementation, 10 mEq daily instead of 20 mEq daily. 3. monitor renal function at least every 2 weeks. Creatinine at the time of discharge is 1.82. 4. You  are on Pradaxa FYI: per package insert, Patients > 80 years: Use with extreme caution or consider other treatment options; no dosage adjustment provided in manufacturer's labeling; however, numerous cases of hemorrhage, including hemorrhagic stroke, have been reported postmarketing, particularly in this age group of octogenarians. Due to a lack of available dosing options available in the [place] consider avoiding use of dabigatran in this population. Since patient has been on that as prescribed by primary care physician we will continue this medication please make note of the information above. Cardiology did not recomend to discontinue this medication 5. you may continue either Lomotil or Imodium for diarrhea. Please do not use both medications either one or the other which ever one controls symptoms better. 6. continue taking Sucralfate suspension as prescribed 7. please continue taking prednisone taper, the next dose is 35 mg 12/25/2012. Then, taper by 5 mg a day (so 30 mg  8/27, 25 mg 8/28 etc..) until you get to the dosage of 5 mg a day which you can continue indefinitely as a low-dose steroids for rheumatoid arthritis. 8. we stopped lisinopril due to acute renal failure. 9. Continue fluconazole 50 gm daily for another 5 days on discharge for urinary tract infection 10. You have completed vancomycin for treatment of lower extremity cellulitis and you do not need another antibiotic for cellulitis at this time.       11. Hold Arava in the setting of acute infection. May restart once infection (UTI) resolved.   Increase activity slowly  As directed        Medication List    STOP taking these medications       leflunomide 20 MG tablet  Commonly known as:  ARAVA     lisinopril 5 MG tablet  Commonly known as:  PRINIVIL,ZESTRIL      TAKE these medications       alum & mag hydroxide-simeth 200-200-20 MG/5ML suspension  Commonly known as:  MAALOX/MYLANTA  Take 30 mLs by mouth every 6 (six) hours as needed.     atorvastatin 10 MG tablet  Commonly known as:  LIPITOR  Take 10 mg by mouth at bedtime.     calcium citrate-vitamin D 315-200 MG-UNIT per tablet  Commonly known as:  CITRACAL+D  Take 1 tablet by mouth 2 (two) times daily.     citalopram 20 MG tablet  Commonly known as:  CELEXA  Take 1 tablet (20 mg total) by mouth daily.     cyanocobalamin 500 MCG tablet  Take 500 mcg by mouth daily.     dabigatran 75 MG Caps capsule  Commonly known as:  PRADAXA  Take 1 capsule (75 mg total) by mouth every 12 (twelve) hours.     diphenoxylate-atropine 2.5-0.025 MG per tablet  Commonly known as:  LOMOTIL  Take 1 tablet by mouth 4 (four) times daily as needed for diarrhea or loose stools.     feeding supplement Liqd  Take 237 mLs by mouth 2 (two) times daily between meals.     fluconazole 50 MG tablet  Commonly known as:  DIFLUCAN  Take 1 tablet (50 mg total) by mouth daily.     folic acid 400 MCG tablet  Commonly known as:  FOLVITE  Take 400 mcg  by mouth daily.     furosemide 20 MG tablet  Commonly known as:  LASIX  Take 1 tablet (20 mg total) by mouth daily.     gabapentin 400 MG capsule  Commonly known as:  NEURONTIN  Take  1 capsule (400 mg total) by mouth at bedtime.     gabapentin 100 MG capsule  Commonly known as:  NEURONTIN  Take 1 capsule (100 mg total) by mouth 2 (two) times daily.     HYDROcodone-acetaminophen 5-325 MG per tablet  Commonly known as:  NORCO/VICODIN  Take 1 tablet by mouth every 6 (six) hours as needed.     isosorbide mononitrate 15 mg Tb24 24 hr tablet  Commonly known as:  IMDUR  Take 0.5 tablets (15 mg total) by mouth daily.     levothyroxine 25 MCG tablet  Commonly known as:  SYNTHROID, LEVOTHROID  TAKE 1 TABLET DAILY     loperamide 2 MG capsule  Commonly known as:  IMODIUM  Take 1 capsule (2 mg total) by mouth as needed for diarrhea or loose stools.     magic mouthwash w/lidocaine Soln  Take 5 mLs by mouth 4 (four) times daily.     metoprolol succinate 100 MG 24 hr tablet  Commonly known as:  TOPROL-XL  Take 50 mg by mouth 2 (two) times daily. Take with or immediately following a meal.     nitroGLYCERIN 0.4 MG SL tablet  Commonly known as:  NITROSTAT  Place 0.4 mg under the tongue every 5 (five) minutes x 3 doses as needed for chest pain.     ondansetron 4 MG tablet  Commonly known as:  ZOFRAN  Take 1 tablet (4 mg total) by mouth 3 (three) times daily before meals. For nausea     pantoprazole 40 MG tablet  Commonly known as:  PROTONIX  Take 40 mg by mouth daily.     potassium chloride 10 MEQ tablet  Commonly known as:  K-DUR  Take 2 tablets (20 mEq total) by mouth daily.     predniSONE 5 MG tablet  Commonly known as:  DELTASONE  Take 7 tablets (35 mg total) by mouth daily.  Start taking on:  12/25/2012     sucralfate 1 GM/10ML suspension  Commonly known as:  CARAFATE  Take 1 g by mouth 4 (four) times daily -  with meals and at bedtime.     TYLENOL 500 MG tablet   Generic drug:  acetaminophen  Take 500 mg by mouth every 6 (six) hours as needed for pain. For pain           Follow-up Information   Follow up with [name], MD In 2 weeks.   Specialty:  Internal Medicine   Contact information:   [address] [place] [address] [phone]        The results of significant diagnostics from this hospitalization (including imaging, microbiology, ancillary and laboratory) are listed below for reference.    Significant Diagnostic Studies: Dg Chest 2 View  12/13/2012   *RADIOLOGY REPORT*  Clinical Data: Fever  CHEST - 2 VIEW  Comparison:  Study obtained earlier in the day and March 19, 2012  Findings:  There is a degree of underlying emphysematous change. There is mild scarring bilaterally.  There is no edema or consolidation. Heart is mildly enlarged with normal pulmonary vascularity.  Pacemaker leads are attached to the right atrium and right ventricle.  There is degenerative change in the thoracic spine.  No adenopathy.  IMPRESSION: Underlying emphysematous change.  No edema or consolidation.   Original Report Authenticated By: Bretta Bang, M.D.   Dg Chest Port 1 View  12/19/2012   *RADIOLOGY REPORT*  Clinical Data: Congestive heart  failure, pulmonary edema  PORTABLE CHEST - 1 VIEW  Comparison: Chest radiograph 12/13/2012  Findings: Left-sided pacemaker overlies stable enlarged heart silhouette.  There are chronic bronchitic markings.  Small effusions present.  No focal infiltrate.  No pneumothorax.  IMPRESSION: 1.  Mild cardiomegaly and bilateral effusions. 2.  Chronic bronchitic markings and scarring. 3.  No pulmonary edema or infiltrate.   Original Report Authenticated By: Genevive Bi, M.D.   Dg Chest Portable 1 View  12/13/2012   *RADIOLOGY REPORT*  Clinical Data: Bodyaches.  Shortness of breath.  Weakness.  PORTABLE CHEST - 1 VIEW  Comparison: Chest x-ray 03/19/2012.  Findings: Mild diffuse  peribronchial cuffing.  No acute consolidative airspace disease.  No pleural effusions.  Lung volumes are normal.  No evidence of pulmonary edema.  Heart size cm borderline enlarged. The patient is rotated to the left on today's exam, resulting in distortion of the mediastinal contours and reduced diagnostic sensitivity and specificity for mediastinal pathology.  Atherosclerosis in the thoracic aorta.  Left-sided pacemaker device in place with lead tips projecting over the expected location of the right atrium and right ventricular apex.  IMPRESSION: 1.  Mild diffuse peribronchial cuffing may suggest bronchitis. 2.  Borderline cardiomegaly. 3.  Atherosclerosis.   Original Report Authenticated By: Trudie Reed, M.D.    Microbiology: Recent Results (from the past 240 hour(s))  URINE CULTURE     Status: None   Collection Time    12/17/12 12:35 PM      Result Value Range Status   Specimen Description URINE, CLEAN CATCH   Final   Special Requests NONE   Final   Culture  Setup Time     Final   Value: 12/17/2012 21:56     Performed at [facility] Count     Final   Value: NO GROWTH     Performed at [facility]   Culture     Final   Value: NO GROWTH     Performed at [facility]   Report Status 12/18/2012 FINAL   Final     Labs: Basic Metabolic Panel:  Recent Labs Lab 12/18/12 0405 12/19/12 0408 12/19/12 0820 12/20/12 0400 12/21/12 1235 12/21/12 1943  NA 140  --  137 141 144 142  K 4.9  --  5.8* 5.3* 4.4 3.9  CL 108  --  106 110 111 109  CO2 20  --  15* 20 20 22   GLUCOSE 93  --  102* 90 88 114*  BUN 45*  --  56* 62* 67* 67*  CREATININE 1.52* 1.74* 1.75* 1.86* 1.80* 1.82*  CALCIUM 8.5  --  8.8 8.7 9.1 8.8   Liver Function Tests: No results found for this basename: AST, ALT, ALKPHOS, BILITOT, PROT, ALBUMIN,  in the last 168 hours No results found for this basename: LIPASE, AMYLASE,  in the last 168 hours No results found for this basename:  AMMONIA,  in the last 168 hours CBC:  Recent Labs Lab 12/18/12 0405 12/19/12 0820 12/20/12 0400 12/21/12 1235 12/23/12 0830  WBC 7.3 7.8 9.0 12.2* 13.6*  HGB 7.9* 9.8* 9.1* 10.6* 9.1*  HCT 24.2* 30.5* 27.6* 32.3* 27.2*  MCV 108.0* 107.8* 105.7* 106.6* 105.4*  PLT 94* 121* 113* 160 144*   Cardiac Enzymes: No results found for this basename: CKTOTAL, CKMB, CKMBINDEX, TROPONINI,  in the last 168 hours BNP: BNP (last 3 results)  Recent Labs  03/19/12 1009 12/13/12 0600  PROBNP 4164.0* 7685.0*   CBG:  No results found for this basename: GLUCAP,  in the last 168 hours  Time coordinating discharge: Over 30 minutes  Signed:  Manson Passey, MD  [facility]  12/24/2012, 11:42 AM  Pager #: [phone]

## 2012-12-24 NOTE — Progress Notes (Signed)
Nutrition Brief Note  Pt screened for low braden. Pt followed by palliative care. Per goals of care, pt wants no artificial nutrition with feeding tube, and plan is to continue with comfort feeds with known aspiration risk. Noted plans for possible d/c tomorrow. Nutrition signing off.   Levon Hedger MS, RD, LDN [phone] Pager [phone] After Hours Pager

## 2012-12-24 NOTE — Care Management Note (Signed)
    Page 1 of 1   12/24/2012     2:51:17 PM   CARE MANAGEMENT NOTE 12/24/2012  Patient:  RHYLIE, STEHR   Account Number:  [id number]  Date Initiated:  12/17/2012  Documentation initiated by:  Lorenda Ishihara  Subjective/Objective Assessment:   79 yo female admitted with sepsis, failed OP. PTA lived at [facility].     Action/Plan:   Likely SNF at d/c   Anticipated DC Date:  12/24/2012   Anticipated DC Plan:  SKILLED NURSING FACILITY  In-house referral  Clinical Social Worker      DC Planning Services  CM consult      Choice offered to / List presented to:             Status of service:  Completed, signed off Medicare Important Message given?   (If response is "NO", the following Medicare IM given date fields will be blank) Date Medicare IM given:   Date Additional Medicare IM given:    Discharge Disposition:  SKILLED NURSING FACILITY  Per UR Regulation:  Reviewed for med. necessity/level of care/duration of stay  If discussed at Long Length of Stay Meetings, dates discussed:   12/20/2012    Comments:

## 2012-12-24 NOTE — Progress Notes (Signed)
Patient cleared for discharge. Discussed with patient's son. He is agreeable to discharge to [facility] home today. Packet copied and placed in [place]. [facility] called for transport.  Travian Kerner C. Keleigh Kazee MSW, LCSW [phone] (coverage for [employer])

## 2012-12-25 NOTE — Progress Notes (Signed)
Clinical Social Work Department CLINICAL SOCIAL WORK PLACEMENT NOTE 12/25/2012  Patient:  Nicole Roberts, Nicole Roberts  Account Number:  [id number] Admit date:  12/13/2012  Clinical Social Worker:  Cori Razor, LCSW  Date/time:  12/25/2012 11:58 AM  Clinical Social Work is seeking post-discharge placement for this patient at the following level of care:   SKILLED NURSING   (*CSW will update this form in Epic as items are completed)   12/19/2012  Patient/family provided with [facility] Department of Clinical Social Work's list of facilities offering this level of care within the geographic area requested by the patient (or if unable, by the patient's family).  12/19/2012  Patient/family informed of their freedom to choose among providers that offer the needed level of care, that participate in Medicare, Medicaid or managed care program needed by the patient, have an available bed and are willing to accept the patient.    Patient/family informed of [facility]' ownership interest in [facility], as well as of the fact that they are under no obligation to receive care at this facility.  PASARR submitted to EDS on  PASARR number received from EDS on   FL2 transmitted to all facilities in geographic area requested by pt/family on  12/18/2012 FL2 transmitted to all facilities within larger geographic area on   Patient informed that his/her managed care company has contracts with or will negotiate with  certain facilities, including the following:     Patient/family informed of bed offers received:  12/19/2012 Patient chooses bed at [facility] Physician recommends and patient chooses bed at    Patient to be [facility] on  12/24/2012 Patient to be transferred to facility by [facility]  The following physician request were entered in Epic:   Additional Comments: Pt son's is aware insurance may not cover cost of ambulance  transport.  Cori Razor LCSW [phone]

## 2012-12-27 ENCOUNTER — Telehealth: Payer: Self-pay | Admitting: *Deleted

## 2012-12-27 NOTE — Telephone Encounter (Signed)
Britta Mccreedy called requesting whether pt needs to D/C Pradaxa due to the strong Hemorrhagic Stroke warning.  Please advise

## 2012-12-28 NOTE — Telephone Encounter (Signed)
Spoke with Britta Mccreedy advised of MDs message.

## 2012-12-28 NOTE — Telephone Encounter (Signed)
This risk of bleeding with pradaxa is actually lower than risk of bleeding on warfarin - anticoagulation medications are prescribed to minimize risk of embolic stroke in setting of atrial fib (irregular heart beat) and are prescribed by her heart doctor to reduce her risk of embolic stroke. An office visit here or with cardiology would be best to review the risk benefit of continuing or stopping the anticoagulation medication - no changes recommended at this time

## 2013-01-01 ENCOUNTER — Telehealth: Payer: Self-pay | Admitting: Internal Medicine

## 2013-01-01 NOTE — Telephone Encounter (Signed)
Pt on Pradaxa and dtr saw a warning that octogenarian's should not take it, should it be changed ? Uses [facility] , pt at [facility]

## 2013-01-01 NOTE — Telephone Encounter (Addendum)
Dr Johney Frame is aware on the medication, he is the MD who prescribed the drug

## 2013-01-07 ENCOUNTER — Other Ambulatory Visit: Payer: Self-pay | Admitting: Internal Medicine

## 2013-01-20 ENCOUNTER — Other Ambulatory Visit: Payer: Self-pay | Admitting: Internal Medicine

## 2013-01-30 ENCOUNTER — Ambulatory Visit: Payer: Medicare Other | Admitting: Internal Medicine

## 2013-02-06 ENCOUNTER — Ambulatory Visit (INDEPENDENT_AMBULATORY_CARE_PROVIDER_SITE_OTHER): Payer: Medicare Other | Admitting: Podiatrist

## 2013-02-06 ENCOUNTER — Encounter: Payer: Self-pay | Admitting: Podiatrist

## 2013-02-06 DIAGNOSIS — B351 Tinea unguium: Secondary | ICD-10-CM

## 2013-02-06 DIAGNOSIS — M79609 Pain in unspecified limb: Secondary | ICD-10-CM

## 2013-02-06 NOTE — Progress Notes (Signed)
MRN: [id number] Name: Nicole Roberts  Sex: female Age: 79 y.o. DOB: Sep 06, 1921  Provider: Marlowe Aschoff P  Allergies: Sulfa antibiotics and Tramadol hcl   No chief complaint on file.    HPI: Patient is 79 y.o. female who presents today for follow-up of foot and nail care.  Patient relates toenails are painful ambulation and shoe gear and she's unable to self-care for them  Past Medical History  Diagnosis Date  . HEARING LOSS   . MITRAL VALVE INSUFF&AORTIC VALVE INSUFF     moderate MR  . PULMONARY HYPERTENSION   . Atrial fibrillation     permanent  . SICK SINUS SYNDROME     a. Tachybrady syndrome - Guidant PPM 10/2005.  . Edema 03/19/2009    due to venous insufficiency, chronic lymphedema  . URINARY INCONTINENCE   . Rheumatoid arthritis(714.0) dx clarified 2011    On prednisone  . BREAST CANCER 12/2006    ductal ca s/p L lumpectomy  . Diastolic dysfunction   . HYPERTENSION   . HYPERLIPIDEMIA   . GERD   . HYPOTHYROIDISM   . Hyperparathyroidism     2 lobes removed  . Venous insufficiency     chronic BLE edema  . Spinal stenosis   . Anemia     a. Macrocytic - normal B12, folate 08/2011. b. 3/6 heme positive stools 10/2011 - per PCP note, elected against colonscopy.  [name] TIA (transient ischemic attack) 05/2011  . Anxiety        Medication List       This list is accurate as of: 02/06/13  5:29 PM.  Always use your most recent med list.               alum & mag hydroxide-simeth 200-200-20 MG/5ML suspension  Commonly known as:  MAALOX/MYLANTA  Take 30 mLs by mouth every 6 (six) hours as needed.     atorvastatin 10 MG tablet  Commonly known as:  LIPITOR  Take 10 mg by mouth at bedtime.     calcium citrate-vitamin D 315-200 MG-UNIT per tablet  Commonly known as:  CITRACAL+D  Take 1 tablet by mouth 2 (two) times daily.     citalopram 20 MG tablet  Commonly known as:  CELEXA  Take 1 tablet (20 mg total) by mouth daily.     cyanocobalamin 500 MCG tablet  Take  500 mcg by mouth daily.     dabigatran 75 MG Caps capsule  Commonly known as:  PRADAXA  Take 1 capsule (75 mg total) by mouth every 12 (twelve) hours.     diphenoxylate-atropine 2.5-0.025 MG per tablet  Commonly known as:  LOMOTIL  Take 1 tablet by mouth 4 (four) times daily as needed for diarrhea or loose stools.     feeding supplement (ENSURE COMPLETE) Liqd  Take 237 mLs by mouth 2 (two) times daily between meals.     fluconazole 50 MG tablet  Commonly known as:  DIFLUCAN  Take 1 tablet (50 mg total) by mouth daily.     folic acid 400 MCG tablet  Commonly known as:  FOLVITE  Take 400 mcg by mouth daily.     furosemide 20 MG tablet  Commonly known as:  LASIX  TAKE ONE TO ONE AND ONE-HALF TABLETS (20 TO 30 MG) DAILY     gabapentin 400 MG capsule  Commonly known as:  NEURONTIN  Take 1 capsule (400 mg total) by mouth at bedtime.     gabapentin 100 MG capsule  Commonly  known as:  NEURONTIN  Take 1 capsule (100 mg total) by mouth 2 (two) times daily.     HYDROcodone-acetaminophen 5-325 MG per tablet  Commonly known as:  NORCO/VICODIN  Take 1 tablet by mouth every 6 (six) hours as needed.     isosorbide mononitrate 15 mg Tb24 24 hr tablet  Commonly known as:  IMDUR  Take 0.5 tablets (15 mg total) by mouth daily.     levothyroxine 25 MCG tablet  Commonly known as:  SYNTHROID, LEVOTHROID  TAKE 1 TABLET DAILY     loperamide 2 MG capsule  Commonly known as:  IMODIUM  Take 1 capsule (2 mg total) by mouth as needed for diarrhea or loose stools.     magic mouthwash w/lidocaine Soln  Take 5 mLs by mouth 4 (four) times daily.     metoprolol succinate 100 MG 24 hr tablet  Commonly known as:  TOPROL-XL  Take 50 mg by mouth 2 (two) times daily. Take with or immediately following a meal.     nitroGLYCERIN 0.4 MG SL tablet  Commonly known as:  NITROSTAT  Place 0.4 mg under the tongue every 5 (five) minutes x 3 doses as needed for chest pain.     ondansetron 4 MG tablet   Commonly known as:  ZOFRAN  Take 1 tablet (4 mg total) by mouth 3 (three) times daily before meals. For nausea     pantoprazole 40 MG tablet  Commonly known as:  PROTONIX  Take 40 mg by mouth daily.     potassium chloride 10 MEQ tablet  Commonly known as:  K-DUR  Take 2 tablets (20 mEq total) by mouth daily.     predniSONE 5 MG tablet  Commonly known as:  DELTASONE  Take 7 tablets (35 mg total) by mouth daily.     sucralfate 1 GM/10ML suspension  Commonly known as:  CARAFATE  Take 1 g by mouth 4 (four) times daily -  with meals and at bedtime.     TYLENOL 500 MG tablet  Generic drug:  acetaminophen  Take 500 mg by mouth every 6 (six) hours as needed for pain. For pain        No orders of the defined types were placed in this encounter.    Past Surgical History  Procedure Laterality Date  . Pacemaker placement  2005    SSS and syncope  . Cholecystectomy  04/06/99  . Abdominal hysterectomy  1970    Partial  . Left hip arthroscopy  1995  . Right hip arthroscopy  01/2001  . Carpal tunnel release  1998    bilateral  . Breast surgery  12/2006    Left breast lupectomy- Ductal CA  . Left parathyroidectomy  07/2008  . Esophagogastroduodenoscopy N/A 10/18/2012    Procedure: ESOPHAGOGASTRODUODENOSCOPY (EGD);  Surgeon: Meryl Dare, MD;  Location: [facility];  Service: Endoscopy;  Laterality: N/A;     History  Substance Use Topics  . Smoking status: Never Smoker   . Smokeless tobacco: Never Used     Comment: Lives at [facility] since 03/2009- married, lives with spouse. Moved here from [facility] to be near son  . Alcohol Use: No    Family History  Problem Relation Age of Onset  . Ovarian cancer Mother   . Coronary artery disease Sister   . Coronary artery disease Brother   . Hypertension Mother     grandparent  . Lung cancer Father       Physical  Exam  GENERAL APPEARANCE: Alert, conversant. Appropriately groomed. No acute distress.  VASCULAR:  Pedal pulses palpable and strong bilateral.  Capillary refill time is immediate to all digits,  Proximal to distal cooling it warm to warm.  Digital hair growth is present bilateral  NEUROLOGIC: sensation is intact epicritically and protectively to 5.07 monofilament at 5/5 sites bilateral.  Light touch is intact bilateral, vibratory sensation intact bilateral, achilles tendon reflex is intact bilateral.  MUSCULOSKELETAL: acceptable muscle strength, tone and stability bilateral.  Intrinsic muscluature intact bilateral.  Rectus appearance of foot and digits noted bilateral.   DERMATOLOGIC: skin color, texture, and turger are within normal limits.  No preulcerative lesions are seen, no interdigital maceration noted.  No open lesions present.  Digital nails are symptomatic and painful 1 through 5 bilateral. Nails are also elongated thick and discolored dystrophic brittle painful and clinic mycotic.   Patient Active Problem List   Diagnosis Date Noted  . Thrombocytopenia, unspecified 12/20/2012  . Hyperkalemia 12/20/2012  . Severe sepsis with septic shock 12/14/2012  . Hypokalemia 12/13/2012  . Depression   . Left lower extremity cellulitis in the setting of chronic venous stasis/edema 09/16/2012  . Stage III chronic kidney disease 04/06/2012  . Atrial fibrillation 02/05/2012  . Macrocytic anemia 05/16/2011  . Rheumatoid arthritis(714.0) 01/26/2010  . HYPOTHYROIDISM 05/21/2009  . HYPERLIPIDEMIA 05/21/2009  . HYPERTENSION 05/21/2009  . GERD 05/21/2009  . Edema 03/19/2009  . Acute on chronic systolic CHF (congestive heart failure) 03/18/2009    Assessment   Painful mycotic nail x10  Plan  No orders of the defined types were placed in this encounter.    Discussed etiology, pathology, conservative vs. Surgical therapies and at this time debridement of symptomatic toenails was recommended.  Onychoreduction of symptomatic toenails was performed without iatrogenic incident.  Patient was  instructed on signs and symptoms of infection and was told to call immediately should any of these arise.     Delories Heinz, DPM

## 2013-02-25 ENCOUNTER — Ambulatory Visit (INDEPENDENT_AMBULATORY_CARE_PROVIDER_SITE_OTHER): Payer: Medicare Other | Admitting: Internal Medicine

## 2013-02-25 ENCOUNTER — Encounter: Payer: Self-pay | Admitting: Internal Medicine

## 2013-02-25 ENCOUNTER — Other Ambulatory Visit (INDEPENDENT_AMBULATORY_CARE_PROVIDER_SITE_OTHER): Payer: Medicare Other

## 2013-02-25 ENCOUNTER — Telehealth: Payer: Self-pay | Admitting: Internal Medicine

## 2013-02-25 VITALS — BP 110/58 | HR 90 | Temp 98.3°F

## 2013-02-25 DIAGNOSIS — E039 Hypothyroidism, unspecified: Secondary | ICD-10-CM

## 2013-02-25 DIAGNOSIS — L0390 Cellulitis, unspecified: Secondary | ICD-10-CM

## 2013-02-25 DIAGNOSIS — I10 Essential (primary) hypertension: Secondary | ICD-10-CM

## 2013-02-25 DIAGNOSIS — Z23 Encounter for immunization: Secondary | ICD-10-CM

## 2013-02-25 DIAGNOSIS — I4891 Unspecified atrial fibrillation: Secondary | ICD-10-CM

## 2013-02-25 DIAGNOSIS — L0291 Cutaneous abscess, unspecified: Secondary | ICD-10-CM

## 2013-02-25 LAB — CBC WITH DIFFERENTIAL/PLATELET
Basophils Relative: 0.4 % (ref 0.0–3.0)
Eosinophils Relative: 0.2 % (ref 0.0–5.0)
HCT: 27.5 % — ABNORMAL LOW (ref 36.0–46.0)
Hemoglobin: 8.9 g/dL — ABNORMAL LOW (ref 12.0–15.0)
Lymphocytes Relative: 28.2 % (ref 12.0–46.0)
Lymphs Abs: 2.5 10*3/uL (ref 0.7–4.0)
MCV: 104.7 fl — ABNORMAL HIGH (ref 78.0–100.0)
Monocytes Absolute: 0.9 10*3/uL (ref 0.1–1.0)
Monocytes Relative: 10.6 % (ref 3.0–12.0)
Neutro Abs: 5.4 10*3/uL (ref 1.4–7.7)
Platelets: 172 10*3/uL (ref 150.0–400.0)
RDW: 21.2 % — ABNORMAL HIGH (ref 11.5–14.6)
WBC: 8.8 10*3/uL (ref 4.5–10.5)

## 2013-02-25 LAB — BASIC METABOLIC PANEL
BUN: 23 mg/dL (ref 6–23)
Chloride: 101 mEq/L (ref 96–112)
GFR: 32.02 mL/min — ABNORMAL LOW (ref >60.00)
Glucose, Bld: 117 mg/dL — ABNORMAL HIGH (ref 70–99)
Potassium: 5.1 mEq/L (ref 3.5–5.1)
Sodium: 143 mEq/L (ref 135–145)

## 2013-02-25 LAB — TSH: TSH: 2.1 u[IU]/mL (ref 0.35–5.50)

## 2013-02-25 MED ORDER — DABIGATRAN ETEXILATE MESYLATE 75 MG PO CAPS: ORAL_CAPSULE | ORAL | Status: DC

## 2013-02-25 MED ORDER — CALCIUM CITRATE-VITAMIN D 315-200 MG-UNIT PO TABS: 2.0000 | ORAL_TABLET | Freq: Three times a day (TID) | ORAL | Status: DC

## 2013-02-25 MED ORDER — THERA VITAL M PO TABS: 1.0000 | ORAL_TABLET | Freq: Every day | ORAL | Status: DC

## 2013-02-25 MED ORDER — VITAMIN D 1000 UNITS PO TABS: 1000.0000 [IU] | ORAL_TABLET | Freq: Every day | ORAL | Status: DC

## 2013-02-25 MED ORDER — ONDANSETRON HCL 4 MG PO TABS: 4.0000 mg | ORAL_TABLET | Freq: Two times a day (BID) | ORAL | Status: DC | PRN

## 2013-02-25 MED ORDER — SUCRALFATE 1 GM/10ML PO SUSP: 1.0000 g | Freq: Two times a day (BID) | ORAL | Status: DC

## 2013-02-25 MED ORDER — CIPROFLOXACIN HCL 250 MG PO TABS: 250.0000 mg | ORAL_TABLET | Freq: Two times a day (BID) | ORAL | Status: DC

## 2013-02-25 NOTE — Assessment & Plan Note (Signed)
On anticoag pradaxa until hospitalization summer 2014, now off  ASA stopped 01/2012 Rate controlled on current beta blocker, continue extended release at current dosing (dose reduced from 100 bid to 50 bid in 09/2012 at home to avoid hypotension and fatigue) Follows with cards for same - appointment to be scheduled for review To remain off anticoagulation until further instruction by cardiology Recheck labs today to ensure no anemia or other contraindication

## 2013-02-25 NOTE — Progress Notes (Signed)
Subjective:    Patient ID: Nicole Roberts, female    DOB: [birth year], 79 y.o.   MRN: [id number]  HPI  Patient is here today for follow-up with multiple questions about medications. Pt recently discharged from SNF and is now in assisted living.   Chronic medical issues reviewed - meds reviewed and reconciled  HTN - pt previously on Lisinopril. This has been discontinued due to renal insufficiency Currently on Metoprolol.  Blood pressure controlled without adverse symptoms.    Atrial fibrillation - permanent, s/p pacer.  Previously on Pradaxa for anticoagulation but this was discontinued post recent hospitalization.  Denies palpitations, dizziness. ?resume  Peripheral neuropathy - Gabapentin had been increased previously to 400mg  qhs and 100mg  qam and mid day.  Pt reports no neuropathy for several months on current dose.  Interested in decreasing dose today.   CKD - has been stable over past 2 months.   Past Medical History  Diagnosis Date  . HEARING LOSS   . MITRAL VALVE INSUFF&AORTIC VALVE INSUFF     moderate MR  . PULMONARY HYPERTENSION   . Atrial fibrillation     permanent  . SICK SINUS SYNDROME     a. Tachybrady syndrome - Guidant PPM 10/2005.  . Edema 03/19/2009    due to venous insufficiency, chronic lymphedema  . URINARY INCONTINENCE   . Rheumatoid arthritis(714.0) dx clarified 2011    On prednisone  . BREAST CANCER 12/2006    ductal ca s/p L lumpectomy  . Diastolic dysfunction   . HYPERTENSION   . HYPERLIPIDEMIA   . GERD   . HYPOTHYROIDISM   . Hyperparathyroidism     2 lobes removed  . Venous insufficiency     chronic BLE edema  . Spinal stenosis   . Anemia     a. Macrocytic - normal B12, folate 08/2011. b. 3/6 heme positive stools 10/2011 - per PCP note, elected against colonscopy.  [name] TIA (transient ischemic attack) 05/2011  . Anxiety      Review of Systems  Constitutional: Negative for fever, chills and activity change.  HENT: Negative for congestion, ear  pain, rhinorrhea and sinus pressure.   Respiratory: Negative for chest tightness and shortness of breath.   Cardiovascular: Positive for leg swelling (chronic). Negative for chest pain and palpitations.  Gastrointestinal: Positive for constipation (chronic). Negative for nausea, vomiting and diarrhea.  Endocrine: Negative for cold intolerance, heat intolerance, polydipsia and polyuria.  Neurological: Negative for dizziness, syncope and headaches.       Objective:   Physical Exam  Constitutional: She is oriented to person, place, and time. She appears well-developed and well-nourished. No distress.  Neck: Normal range of motion. Neck supple. No thyromegaly present.  Cardiovascular: Normal rate and normal heart sounds.  An irregularly irregular rhythm present.  Pulmonary/Chest: Effort normal and breath sounds normal. No respiratory distress. She has no wheezes.  Abdominal: Soft. Bowel sounds are normal. She exhibits no distension and no mass. There is no tenderness.  Musculoskeletal: She exhibits edema (bilateral lower extremity 3+).  Lymphadenopathy:    She has no cervical adenopathy.  Neurological: She is alert and oriented to person, place, and time.  Skin: Skin is warm and dry. No rash noted. She is not diaphoretic.    Wt Readings from Last 3 Encounters:  12/24/12 156 lb 8.4 oz (71 kg)  11/28/12 148 lb 6.4 oz (67.314 kg)  10/29/12 126 lb 12.8 oz (57.516 kg)   BP Readings from Last 3 Encounters:  02/25/13 110/58  12/24/12 124/70  11/28/12 110/68       Assessment & Plan:   See problem list -   Time spent with pt/family today 25 minutes, greater than 50% time spent counseling patient on interval event and medication review. Also review of prior records

## 2013-02-25 NOTE — Assessment & Plan Note (Signed)
On low dose synthroid, reviewed interval dose changes ( , now ) recheck TSH now, adjust as needed  Lab Results  Component Value Date   TSH 1.058 09/17/2012

## 2013-02-25 NOTE — Telephone Encounter (Signed)
Both pt and son is here discussing meds. Hosp f/u...lmb

## 2013-02-25 NOTE — Assessment & Plan Note (Signed)
Chronic venostasis with lymphedema bilaterally Hospitalization for cellulitis summer 2014 reviewed Continue wrapping care and diuretics as tolerated On Neurontin at bedtime for neuropathy associated pain -discontinue daytime dosing at this time

## 2013-02-25 NOTE — Patient Instructions (Signed)
It was good to see you today.  We have reviewed your prior records including labs and tests today  Your annual flu shot was given and/or updated today.  Medications reviewed and updated -See list below  Okay to discontinue oxygen. I will notify [facility] of same  Test(s) ordered today. Your results will be released to MyChart (or called to you) after review, usually within 72hours after test completion. If any changes need to be made, you will be notified at that same time.

## 2013-02-25 NOTE — Telephone Encounter (Signed)
02/25/2013   pts son Art left message regarding pt transferring from [facility] to [place] and needs copy of last appt notes for new facility.  Art's contact number is [phone].

## 2013-02-25 NOTE — Assessment & Plan Note (Signed)
ACE inhibitor discontinued because of progressive renal failure Remains on beta blocker as for A. Fib rate control Asymptomatic at this time Continue same pending further cardiology review  BP Readings from Last 3 Encounters:  02/25/13 110/58  12/24/12 124/70  11/28/12 110/68

## 2013-02-26 ENCOUNTER — Encounter (HOSPITAL_COMMUNITY): Payer: Self-pay | Admitting: Emergency Medicine

## 2013-02-26 ENCOUNTER — Encounter: Payer: Self-pay | Admitting: Internal Medicine

## 2013-02-26 ENCOUNTER — Emergency Department (HOSPITAL_COMMUNITY)
Admission: EM | Admit: 2013-02-26 | Discharge: 2013-02-26 | Disposition: A | Discharge status: Home / Self Care | Payer: Medicare Other | Attending: Emergency Medicine | Admitting: Emergency Medicine

## 2013-02-26 ENCOUNTER — Telehealth: Payer: Self-pay | Admitting: Internal Medicine

## 2013-02-26 ENCOUNTER — Emergency Department (HOSPITAL_COMMUNITY): Payer: Medicare Other

## 2013-02-26 DIAGNOSIS — Z8673 Personal history of transient ischemic attack (TIA), and cerebral infarction without residual deficits: Secondary | ICD-10-CM | POA: Insufficient documentation

## 2013-02-26 DIAGNOSIS — Z853 Personal history of malignant neoplasm of breast: Secondary | ICD-10-CM | POA: Insufficient documentation

## 2013-02-26 DIAGNOSIS — F411 Generalized anxiety disorder: Secondary | ICD-10-CM | POA: Insufficient documentation

## 2013-02-26 DIAGNOSIS — D649 Anemia, unspecified: Secondary | ICD-10-CM | POA: Insufficient documentation

## 2013-02-26 DIAGNOSIS — S0993XA Unspecified injury of face, initial encounter: Secondary | ICD-10-CM

## 2013-02-26 DIAGNOSIS — Z8619 Personal history of other infectious and parasitic diseases: Secondary | ICD-10-CM | POA: Insufficient documentation

## 2013-02-26 DIAGNOSIS — Z7902 Long term (current) use of antithrombotics/antiplatelets: Secondary | ICD-10-CM | POA: Insufficient documentation

## 2013-02-26 DIAGNOSIS — Z792 Long term (current) use of antibiotics: Secondary | ICD-10-CM | POA: Insufficient documentation

## 2013-02-26 DIAGNOSIS — I509 Heart failure, unspecified: Secondary | ICD-10-CM | POA: Insufficient documentation

## 2013-02-26 DIAGNOSIS — S025XXA Fracture of tooth (traumatic), initial encounter for closed fracture: Secondary | ICD-10-CM | POA: Insufficient documentation

## 2013-02-26 DIAGNOSIS — E785 Hyperlipidemia, unspecified: Secondary | ICD-10-CM | POA: Insufficient documentation

## 2013-02-26 DIAGNOSIS — Z79899 Other long term (current) drug therapy: Secondary | ICD-10-CM | POA: Insufficient documentation

## 2013-02-26 DIAGNOSIS — Z872 Personal history of diseases of the skin and subcutaneous tissue: Secondary | ICD-10-CM | POA: Insufficient documentation

## 2013-02-26 DIAGNOSIS — N189 Chronic kidney disease, unspecified: Secondary | ICD-10-CM | POA: Insufficient documentation

## 2013-02-26 DIAGNOSIS — M542 Cervicalgia: Secondary | ICD-10-CM

## 2013-02-26 DIAGNOSIS — W19XXXA Unspecified fall, initial encounter: Secondary | ICD-10-CM

## 2013-02-26 DIAGNOSIS — I129 Hypertensive chronic kidney disease with stage 1 through stage 4 chronic kidney disease, or unspecified chronic kidney disease: Secondary | ICD-10-CM | POA: Insufficient documentation

## 2013-02-26 DIAGNOSIS — M7989 Other specified soft tissue disorders: Secondary | ICD-10-CM | POA: Insufficient documentation

## 2013-02-26 DIAGNOSIS — Y9210 Unspecified residential institution as the place of occurrence of the external cause: Secondary | ICD-10-CM | POA: Insufficient documentation

## 2013-02-26 DIAGNOSIS — H9190 Unspecified hearing loss, unspecified ear: Secondary | ICD-10-CM | POA: Insufficient documentation

## 2013-02-26 DIAGNOSIS — K219 Gastro-esophageal reflux disease without esophagitis: Secondary | ICD-10-CM | POA: Insufficient documentation

## 2013-02-26 DIAGNOSIS — M069 Rheumatoid arthritis, unspecified: Secondary | ICD-10-CM | POA: Insufficient documentation

## 2013-02-26 DIAGNOSIS — W1809XA Striking against other object with subsequent fall, initial encounter: Secondary | ICD-10-CM | POA: Insufficient documentation

## 2013-02-26 DIAGNOSIS — Y9389 Activity, other specified: Secondary | ICD-10-CM | POA: Insufficient documentation

## 2013-02-26 DIAGNOSIS — Z993 Dependence on wheelchair: Secondary | ICD-10-CM | POA: Insufficient documentation

## 2013-02-26 DIAGNOSIS — R609 Edema, unspecified: Secondary | ICD-10-CM | POA: Insufficient documentation

## 2013-02-26 DIAGNOSIS — I4891 Unspecified atrial fibrillation: Secondary | ICD-10-CM | POA: Insufficient documentation

## 2013-02-26 DIAGNOSIS — Z950 Presence of cardiac pacemaker: Secondary | ICD-10-CM | POA: Insufficient documentation

## 2013-02-26 DIAGNOSIS — E039 Hypothyroidism, unspecified: Secondary | ICD-10-CM | POA: Insufficient documentation

## 2013-02-26 MED ORDER — CALCIUM-PHOSPHORUS-VITAMIN D 250-115-250 MG-MG-UNIT PO CHEW: 2.0000 [IU] | CHEWABLE_TABLET | Freq: Three times a day (TID) | ORAL | Status: DC

## 2013-02-26 MED ORDER — CALCIUM-PHOSPHORUS-VITAMIN D 250-100-500 MG-MG-UNIT PO CHEW: 1.0000 | CHEWABLE_TABLET | Freq: Two times a day (BID) | ORAL | Status: DC

## 2013-02-26 NOTE — ED Provider Notes (Signed)
CSN: [id number]     Arrival date & time 02/26/13  0907 History   First MD Initiated Contact with Patient 02/26/13 0911     Chief Complaint  Patient presents with  . Fall   (Consider location/radiation/quality/duration/timing/severity/associated sxs/prior Treatment) Patient is a 79 y.o. female presenting with fall.  Fall Associated symptoms include neck pain. Pertinent negatives include no abdominal pain, chest pain, headaches, numbness or weakness.   Patient is an 79 yo female with a history of CHF, afib, sick sinus syndrome s/p pace maker placement, CKD, and recent history of LLE cellulitis and sepsis who presents s/p fall this morning. Patient transitioned to assisted living facility yesterday following 2 month stay at rehab center. She was sitting in a shower chair this morning when she tried to get up unassisted. She notes that her feet came out from under her and she fell forward striking her front teeth. She noted fracture of tooth and neck pain following the fall. She denies LOC. States has not been on anticoagulant (pradaxa) since the middle of September. Son notes that she has not been able to get up without assistance in about 2 months and this was the reason she was at rehab. She gets around in a wheel chair. She denies pain at this time. She denies chest pain, difficulty breathing, abdominal pain, extremity injury, head injury.  Past Medical History  Diagnosis Date  . HEARING LOSS   . MITRAL VALVE INSUFF&AORTIC VALVE INSUFF     moderate MR  . PULMONARY HYPERTENSION   . Atrial fibrillation     permanent  . SICK SINUS SYNDROME     a. Tachybrady syndrome - Guidant PPM 10/2005.  . Edema 03/19/2009    due to venous insufficiency, chronic lymphedema  . URINARY INCONTINENCE   . Rheumatoid arthritis(714.0) dx clarified 2011    On prednisone  . BREAST CANCER 12/2006    ductal ca s/p L lumpectomy  . Diastolic dysfunction   . HYPERTENSION   . HYPERLIPIDEMIA   . GERD   .  HYPOTHYROIDISM   . Hyperparathyroidism     2 lobes removed  . Venous insufficiency     chronic BLE edema  . Spinal stenosis   . Anemia     a. Macrocytic - normal B12, folate 08/2011. b. 3/6 heme positive stools 10/2011 - per PCP note, elected against colonscopy.  [name] TIA (transient ischemic attack) 05/2011  . Anxiety    Past Surgical History  Procedure Laterality Date  . Pacemaker placement  2005    SSS and syncope  . Cholecystectomy  04/06/99  . Abdominal hysterectomy  1970    Partial  . Left hip arthroscopy  1995  . Right hip arthroscopy  01/2001  . Carpal tunnel release  1998    bilateral  . Breast surgery  12/2006    Left breast lupectomy- Ductal CA  . Left parathyroidectomy  07/2008  . Esophagogastroduodenoscopy N/A 10/18/2012    Procedure: ESOPHAGOGASTRODUODENOSCOPY (EGD);  Surgeon: Meryl Dare, MD;  Location: [facility];  Service: Endoscopy;  Laterality: N/A;   Family History  Problem Relation Age of Onset  . Ovarian cancer Mother   . Coronary artery disease Sister   . Coronary artery disease Brother   . Hypertension Mother     grandparent  . Lung cancer Father    History  Substance Use Topics  . Smoking status: Never Smoker   . Smokeless tobacco: Never Used     Comment: Lives at [facility] since 03/2009-  married, lives with spouse. Moved here from [facility] to be near son  . Alcohol Use: No   OB History   Grav Para Term Preterm Abortions TAB SAB Ect Mult Living                 Review of Systems  HENT: Positive for dental problem. Negative for facial swelling.        Denies dental pain  Respiratory: Negative for chest tightness and shortness of breath.   Cardiovascular: Positive for leg swelling (bilateral ankles and feet). Negative for chest pain.  Gastrointestinal: Negative for abdominal pain.  Musculoskeletal: Positive for neck pain. Negative for back pain.  Neurological: Negative for weakness, numbness and headaches.    Allergies  Sulfa  antibiotics and Tramadol hcl  Home Medications   Current Outpatient Rx  Name  Route  Sig  Dispense  Refill  . acetaminophen (TYLENOL) 500 MG tablet   Oral   Take 500 mg by mouth every 6 (six) hours as needed for pain. For pain         . alum & mag hydroxide-simeth (MAALOX/MYLANTA) 200-200-20 MG/5ML suspension   Oral   Take 30 mLs by mouth every 6 (six) hours as needed.   355 mL   0   . Alum & Mag Hydroxide-Simeth (MAGIC MOUTHWASH W/LIDOCAINE) SOLN   Oral   Take 5 mLs by mouth 4 (four) times daily.   5 mL   0   . atorvastatin (LIPITOR) 10 MG tablet   Oral   Take 10 mg by mouth at bedtime.         . calcium citrate-vitamin D (CITRACAL+D) 315-200 MG-UNIT per tablet   Oral   Take 2 tablets by mouth 3 (three) times daily.         . cholecalciferol (VITAMIN D) 1000 UNITS tablet   Oral   Take 1 tablet (1,000 Units total) by mouth daily.         . ciprofloxacin (CIPRO) 250 MG tablet   Oral   Take 1 tablet (250 mg total) by mouth 2 (two) times daily. Until course is gone   6 tablet   0   . citalopram (CELEXA) 20 MG tablet   Oral   Take 1 tablet (20 mg total) by mouth daily.   30 tablet   0   . cyanocobalamin 500 MCG tablet   Oral   Take 500 mcg by mouth daily.         . dabigatran (PRADAXA) 75 MG CAPS capsule      HOLD until further discussion with Dr Allred   60 capsule      . diphenoxylate-atropine (LOMOTIL) 2.5-0.025 MG per tablet   Oral   Take 1 tablet by mouth 4 (four) times daily as needed for diarrhea or loose stools.         . feeding supplement (ENSURE COMPLETE) LIQD   Oral   Take 237 mLs by mouth 2 (two) times daily between meals.   10 Bottle   1   . folic acid (FOLVITE) 400 MCG tablet   Oral   Take 400 mcg by mouth daily.         . furosemide (LASIX) 20 MG tablet      TAKE ONE TO ONE AND ONE-HALF TABLETS (20 TO 30 MG) DAILY   90 tablet   0   . gabapentin (NEURONTIN) 400 MG capsule   Oral   Take 1 capsule (400 mg total) by  mouth  at bedtime.         [name] HYDROcodone-acetaminophen (NORCO/VICODIN) 5-325 MG per tablet   Oral   Take 1 tablet by mouth every 6 (six) hours as needed.   30 tablet   0   . isosorbide mononitrate (IMDUR) 15 mg TB24 24 hr tablet   Oral   Take 0.5 tablets (15 mg total) by mouth daily.   30 tablet   0   . levothyroxine (SYNTHROID, LEVOTHROID) 25 MCG tablet      TAKE 1 TABLET DAILY   90 tablet   3   . loperamide (IMODIUM) 2 MG capsule   Oral   Take 1 capsule (2 mg total) by mouth as needed for diarrhea or loose stools.   30 capsule   0   . metoprolol succinate (TOPROL-XL) 50 MG 24 hr tablet   Oral   Take 50 mg by mouth 2 (two) times daily. Take with or immediately following a meal.         . Multiple Vitamins-Minerals (MULTIVITAMIN) tablet   Oral   Take 1 tablet by mouth daily.         . nitroGLYCERIN (NITROSTAT) 0.4 MG SL tablet   Sublingual   Place 0.4 mg under the tongue every 5 (five) minutes x 3 doses as needed for chest pain.         [name] ondansetron (ZOFRAN) 4 MG tablet   Oral   Take 1 tablet (4 mg total) by mouth every 12 (twelve) hours as needed for nausea. For nausea   30 tablet   1   . pantoprazole (PROTONIX) 40 MG tablet   Oral   Take 40 mg by mouth daily.         . potassium chloride (K-DUR) 10 MEQ tablet   Oral   Take 2 tablets (20 mEq total) by mouth daily.   60 tablet   11   . predniSONE (DELTASONE) 5 MG tablet   Oral   Take 7 tablets (35 mg total) by mouth daily.   30 tablet   0     Please take prednisone 35 mg tomorrow 12/25/2012 a ...   . sucralfate (CARAFATE) 1 GM/10ML suspension   Oral   Take 10 mLs (1 g total) by mouth 2 (two) times daily. 8AM and 6PM   420 mL   3    BP 130/63  Pulse 96  Temp(Src) 98.1 F (36.7 C) (Oral)  Resp 18  SpO2 97% Physical Exam  Constitutional: She appears well-developed and well-nourished. No distress.  HENT:  Head: Normocephalic.  Mouth/Throat: Oropharynx is clear and moist.  Left  upper incisor with broken portion, no battle sign bilaterally  Eyes: EOM are normal. Pupils are equal, round, and reactive to light.  Neck:    Patient in c-collar no apparent midline tenderness, she has tenderness in left upper trapezius distribution  Cardiovascular: Normal rate, regular rhythm and normal heart sounds.   Pulmonary/Chest: Effort normal and breath sounds normal.  Abdominal: Soft. She exhibits no distension. There is no tenderness.  Musculoskeletal: She exhibits edema (2+ pitting edema of bilateral ankles and feet).  No midline spine tenderness  Neurological: She is alert.  CN intact, 5/5 strength in bilateral biceps, triceps, grip, hip flexors, plantar and dorsiflexion, she is able to lift her arms anteriorly to her baseline level, sensation to light touch is intact to bilateral upper and lower extremities, 1+ patellar reflexes  Skin: Skin is warm and dry.  No asymmetrical erythema of the  skin in LE    ED Course  Procedures (including critical care time) Labs Review Labs Reviewed - No data to display Imaging Review Ct Cervical Spine Wo Contrast  02/26/2013   CLINICAL DATA:  History of fall complaining of left-sided neck pain.  EXAM: CT CERVICAL SPINE WITHOUT CONTRAST  TECHNIQUE: Multidetector CT imaging of the cervical spine was performed without intravenous contrast. Multiplanar CT image reconstructions were also generated.  COMPARISON:  None.  FINDINGS: No definite acute displaced fracture of the cervical spine. Well corticated bony fragment adjacent to the tip of the C6 spinous process is compatible with an old avulsion fracture. There is severe multilevel degenerative disc disease, most pronounced at C3-C4, C4-C5 and C5-C6. In the same regions, there is extensive facet arthropathy bilaterally. These degenerative changes result in some reversal of normal cervical lordosis, centered at the level of C3. Alignment is otherwise anatomic. Prevertebral soft tissues are normal.  Visualized portions of the upper thorax are remarkable for a markedly patulous esophagus.  IMPRESSION: 1. No definite evidence of significant acute traumatic injury to the cervical spine. 2. Advanced multilevel degenerative disc disease and cervical spondylosis, as above. 3. Old avulsion fracture of the tip of the C6 spinous process presumably related to remote trauma.   Electronically Signed   By: Trudie Reed M.D.   On: 02/26/2013 11:27    EKG Interpretation   None       MDM   1. Fall, initial encounter   2. Dental injury, initial encounter   3. Neck pain on left side    9:45 am: patient seen and examined. Patient with fall with injury to frontal incisor without LOC who presented with tooth injury and neck pain. Tooth with exposed dentin, though no exposed root or bleeding. Patient with no midline tenderness to cervical spine, though given age and the fact that the patient is on fentanyl patch will obtain CT c-spine to rule out fracture. Patient does not exhibit signs of central cord syndrome as she exhibits normal strength and sensation in upper extremities and baseline movements in upper extremities. Patient is not currently on pradaxa. Will await CT scan. Will need follow-up with dentist in the next week.  11:35 am: patients CT scan of cervical spin returned without acute fracture. Her neck has been cleared and the c-spine collar was taken off. Patient advised to use tylenol and her prn vicodin (already has prescription for this at assisted living) for pain control. Advised to follow-up with dentist in the next week. Advised to have assisted living facility and PCP monitor LLE for recurrence of cellulitis. Discussed return precautions with the patient and son.  This patient was discussed and seen with my attending Dr Hyacinth Meeker.  Marikay Alar, MD [facility] PGY-2 02/26/13 12:03 pm    Glori Luis, MD 02/26/13 [phone]

## 2013-02-26 NOTE — Telephone Encounter (Signed)
1) I have not yet come across notes/records from Dr Larina Bras -  Thus, i am unable to answer questions about why Pradaxa was stopped, but I am certain Dr Johney Frame can address at upcoming appt! Also, not able to share copy since I do not have this to share!  2) re: calcium gummies - there is none listed in EPIC that matches that dosing, so I do not know how to order... I have change to closest match (gummies with 250/250), but ok to generate paper rx to fax if son/facility need another order.Marland KitchenMarland Kitchen

## 2013-02-26 NOTE — ED Notes (Signed)
Patient transported to CT 

## 2013-02-26 NOTE — Addendum Note (Signed)
Addended by: Deatra James on: 02/26/2013 08:20 AM   Modules accepted: Orders

## 2013-02-26 NOTE — ED Notes (Signed)
Sonneberg MD at bedside.

## 2013-02-26 NOTE — ED Notes (Addendum)
Pt presents to ED via [facility] from [facility] [place]. Pt reports she was sitting in her shower chair, the nursing assistant walked off to get pt's clothes, pt states she pulled herself up my the shower bar and lost her balance fell forward hitting her front tooth pt also c/o posterior neck pain. Pt reports she is at [facility] for rehabilitation due to cellulitis to BLE. Pt reports she has not ambulated on her own without some type of walker and 1 person assist. Pt is alert and oriented x4. Pt still has BLE swelling with increase swelling to her Right lower extremity. Pt presents with a C-collar in place. Pt denies LOC, N/V, or dizziness

## 2013-02-26 NOTE — ED Provider Notes (Signed)
I saw and evaluated the patient, reviewed the resident's note and I agree with the findings and plan.  Please see my separate note regarding my evaluation of the patient.     Vida Roller, MD 02/26/13 [phone]

## 2013-02-26 NOTE — Telephone Encounter (Signed)
Notified pt son with md response. Generated rx for Gummy and fax to [phone] per his request...lmb

## 2013-02-26 NOTE — Telephone Encounter (Signed)
02/26/2013  Art ,pts son, called in regards to a fax coming from [employer] [employer] (medical hx at facility).  Art would like a copy of this information as well.  Art also stated that pt was admitted to [facility] from fall that happened this AM, 02/26/2013.  Art had a question about RX calcium citrate-vitamin D (CITRACAL+D) 315-200 MG-UNIT per tablet ...states this is different that what pt is taking.  Please contact Art [phone]

## 2013-02-26 NOTE — ED Notes (Addendum)
Pt arrived by [facility] from [facility] [place]. Pt was in shower chair and fell landing on face causing front tooth to chip. C/o neck pain and is currently in C collar. No anti coagulants and denies any LOC. bp-142/82 hr-80 resp-16 CBG-69.

## 2013-02-26 NOTE — ED Provider Notes (Signed)
79 year old female with recent admission to the hospital because of significant soft tissue infection, has been at a rehabilitation facility and recently moved back to her assisted care facility, presents after a mechanical fall that occurred while she was in the shower. She fell forward striking her upper incisor, has resultant neck pain. She is unable to ambulate at baseline according to the family member who is here with her. The patient complains of neck pain, no toothache, no weakness or numbness of her arms or legs. On exam she has a LS 2 fracture of an upper incisor, there is no bleeding, no exposed root. She has no central neck pain, no tenderness over the cervical spine, no weakness or numbness to the upper extremities, normal strength, follows commands without difficulty, normal coordination, normal speech, normal memory. Her lower extremities have edema at ankles bilaterally without asymmetry, no asymmetrical erythema or induration of the skin.  The patient will need imaging of her cervical spine to rule out fracture, there is no clinical evidence of a central cord syndrome, dental fracture can be followed by dentistry as an outpatient urgently but not emergently.  I saw and evaluated the patient, reviewed the resident's note and I agree with the findings and plan.    Vida Roller, MD 02/26/13 [phone]

## 2013-02-26 NOTE — ED Notes (Signed)
Family at bedside. 

## 2013-02-26 NOTE — Telephone Encounter (Signed)
Called son back to clarify msg. Wanted to know has md received records from Dr. Larina Bras ? About why he stop the pradaxa so Dr. Felicity Coyer can know. Also mother prefer to take the Gummies instead of the calcium caltrate. The one that she has are Gummies 500/500 IU vit D, but [facility] will not give it to her because its not on med sheet. Would like updated med sheet with gummies & copy of notes from Dr. Stones...Raechel Chute

## 2013-02-27 ENCOUNTER — Telehealth: Payer: Self-pay | Admitting: Internal Medicine

## 2013-02-27 NOTE — Telephone Encounter (Signed)
Verbal ok?

## 2013-02-27 NOTE — Telephone Encounter (Signed)
02/27/2013  Cornelius Moras, Physical Therapist, called from [facility].  Recommending 2x for 6 weeks in home therapy.  Please give Cornelius Moras @ [phone] to advise on orders. Occupational and Speech therapy will conducted by end of week as well.

## 2013-02-28 NOTE — Telephone Encounter (Signed)
Notified Owen with md response.../lmb 

## 2013-03-04 ENCOUNTER — Telehealth: Payer: Self-pay | Admitting: *Deleted

## 2013-03-04 MED ORDER — FENTANYL 100 MCG/HR TD PT72: 1.0000 | MEDICATED_PATCH | TRANSDERMAL | Status: DC

## 2013-03-04 MED ORDER — AMPICILLIN 250 MG PO CAPS: 250.0000 mg | ORAL_CAPSULE | Freq: Four times a day (QID) | ORAL | Status: DC

## 2013-03-04 NOTE — Telephone Encounter (Signed)
Art, pts son, called states UTI persists.  He is requesting a refill on the antibiotics to be faxed to [facility] at [phone].  Further states pts rx for Fentanyl Patch was not on last med list.  Please advise once Rx has been faxed.

## 2013-03-04 NOTE — Telephone Encounter (Signed)
Spoke with daughter in law advised Rx for Ampicillin faxed.  Med list updated adding Fentanyl Patch, however refill not needed.  Rx discarded, not picked up by pt or family member.

## 2013-03-04 NOTE — Telephone Encounter (Signed)
Will treat UTI symptoms with different antibiotic, ampicillin. Printed and signed, okay to fax  Prescription for prior fentanyl has been renewed. Unable to fax this prescription. Please advise son Re: pickup of rx  thanks

## 2013-03-05 ENCOUNTER — Telehealth: Payer: Self-pay | Admitting: *Deleted

## 2013-03-05 NOTE — Telephone Encounter (Signed)
[facility] Triage Call Report Triage Record Num: [id number] Operator: Judeen Hammans Patient Name: Nicole Roberts Call Date & Time: 03/02/2013 10:20:19PM Patient Phone: [phone] PCP: [name] Patient Gender: Female PCP Fax : [phone] Patient DOB: 1921/06/29 Practice Name: [facility] Reason for Call: Caller: Berneta Levins; PCP: [name] (Adults only); CB#: [phone]; Call regarding Urinary Pain/Bleeding; (resident of [facility] in [place]). Pt is complaining of pain during urination despite finishing regimen of Cipro on 10/27 for UTI. All emergent sxs ruled out per Uriniary Symptoms - Female Guideline except for "Has one or more urinary tract symptoms and has not been previously evaluated." Advised to have pt evaluated within 24 hours per disposition. Caller is not sure if the facility will send patient. Advised caller to inform facility that pt must be evaluated by MD for UTI symptoms and if there is a problem to have the facility contact the office directly regarding pt's symptoms. Caller agrees. Protocol(s) Used: Urinary Symptoms - Female Recommended Outcome per Protocol: See Provider within 24 hours Reason for Outcome: Has one or more urinary tract symptoms AND has not been previously evaluated Care Advice: ~ SYMPTOM / CONDITION MANAGEMENT Analgesic/Antipyretic Advice - NSAIDs: Consider aspirin, ibuprofen, naproxen or ketoprofen for pain or fever as directed on label or by pharmacist/provider. PRECAUTIONS: - If over 67 years of age, should not take longer than 1 week without consulting provider. EXCEPTIONS: - Should not be used if taking blood thinners or have bleeding problems. - Do not use if have history of sensitivity/allergy to any of these medications; or history of cardiovascular, ulcer, kidney, liver disease or diabetes unless approved by provider. - Do not exceed recommended dose or frequency. ~ 11

## 2013-03-06 ENCOUNTER — Ambulatory Visit: Payer: Medicare Other | Admitting: Nurse Practitioner

## 2013-03-07 DIAGNOSIS — Z5189 Encounter for other specified aftercare: Secondary | ICD-10-CM

## 2013-03-07 DIAGNOSIS — M48061 Spinal stenosis, lumbar region without neurogenic claudication: Secondary | ICD-10-CM

## 2013-03-07 DIAGNOSIS — L03119 Cellulitis of unspecified part of limb: Secondary | ICD-10-CM

## 2013-03-07 DIAGNOSIS — L02419 Cutaneous abscess of limb, unspecified: Secondary | ICD-10-CM

## 2013-03-07 DIAGNOSIS — I509 Heart failure, unspecified: Secondary | ICD-10-CM

## 2013-03-07 DIAGNOSIS — R269 Unspecified abnormalities of gait and mobility: Secondary | ICD-10-CM

## 2013-03-08 ENCOUNTER — Encounter: Payer: Self-pay | Admitting: Internal Medicine

## 2013-03-08 ENCOUNTER — Ambulatory Visit (INDEPENDENT_AMBULATORY_CARE_PROVIDER_SITE_OTHER): Payer: Medicare Other | Admitting: Internal Medicine

## 2013-03-08 VITALS — BP 129/81 | HR 85 | Ht 61.0 in | Wt 154.0 lb

## 2013-03-08 DIAGNOSIS — I10 Essential (primary) hypertension: Secondary | ICD-10-CM

## 2013-03-08 DIAGNOSIS — I498 Other specified cardiac arrhythmias: Secondary | ICD-10-CM

## 2013-03-08 DIAGNOSIS — I4891 Unspecified atrial fibrillation: Secondary | ICD-10-CM

## 2013-03-08 DIAGNOSIS — R001 Bradycardia, unspecified: Secondary | ICD-10-CM

## 2013-03-08 MED ORDER — APIXABAN 2.5 MG PO TABS: 2.5000 mg | ORAL_TABLET | Freq: Two times a day (BID) | ORAL | Status: DC

## 2013-03-08 NOTE — Patient Instructions (Addendum)
Your physician recommends that you schedule a follow-up appointment in: Levert Feinstein, Pharm D 6 weeks    You will need to follow up on 09-25-2013 @ 2:00pm for a pacemaker check.  Your physician recommends that you schedule a follow-up appointment in: 12 months with Dr.Allred  Your physician has recommended you make the following change in your medication:  1)Stop Pradaxa 2)Start Eliquis 2.5mg  twise daily

## 2013-03-08 NOTE — Progress Notes (Signed)
PCP: [name], MD  The patient presents today for routine electrophysiology followup.  Since last being seen in our clinic, the patient has had difficulty with cellulitis and venous insufficiency.  She was hospitalized and spent time in SNF.  She has returned home.  Her anticoagulation was stopped due to chronic anemia.  She has not had recent bleeding.   Today, she denies symptoms of palpitations, chest pain, shortness of breath, orthopnea, PND,  presyncope, syncope, or neurologic sequela.  The patient feels that she is tolerating medications without difficulties and is otherwise without complaint today.   Past Medical History  Diagnosis Date  . HEARING LOSS   . MITRAL VALVE INSUFF&AORTIC VALVE INSUFF     moderate MR  . PULMONARY HYPERTENSION   . Atrial fibrillation     permanent  . SICK SINUS SYNDROME     a. Tachybrady syndrome - [employer] PPM 10/2005.  . Edema 03/19/2009    due to venous insufficiency, chronic lymphedema  . URINARY INCONTINENCE   . Rheumatoid arthritis(714.0) dx clarified 2011    On prednisone  . BREAST CANCER 12/2006    ductal ca s/p L lumpectomy  . Diastolic dysfunction   . HYPERTENSION   . HYPERLIPIDEMIA   . GERD   . HYPOTHYROIDISM   . Hyperparathyroidism     2 lobes removed  . Venous insufficiency     chronic BLE edema  . Spinal stenosis   . Anemia     a. Macrocytic - normal B12, folate 08/2011. b. 3/6 heme positive stools 10/2011 - per PCP note, elected against colonscopy.  [name] TIA (transient ischemic attack) 05/2011  . Anxiety    Past Surgical History  Procedure Laterality Date  . Pacemaker placement  2005    SSS and syncope  . Cholecystectomy  04/06/99  . Abdominal hysterectomy  1970    Partial  . Left hip arthroscopy  1995  . Right hip arthroscopy  01/2001  . Carpal tunnel release  1998    bilateral  . Breast surgery  12/2006    Left breast lupectomy- Ductal CA  . Left parathyroidectomy  07/2008  . Esophagogastroduodenoscopy N/A 10/18/2012   Procedure: ESOPHAGOGASTRODUODENOSCOPY (EGD);  Surgeon: Meryl Dare, MD;  Location: [facility];  Service: Endoscopy;  Laterality: N/A;    Current Outpatient Prescriptions  Medication Sig Dispense Refill  . acetaminophen (TYLENOL) 500 MG tablet Take 500 mg by mouth every 6 (six) hours as needed for pain. For pain      . Alum & Mag Hydroxide-Simeth (MAGIC MOUTHWASH W/LIDOCAINE) SOLN Take 5 mLs by mouth 4 (four) times daily.  5 mL  0  . ampicillin (PRINCIPEN) 250 MG capsule Take 1 capsule (250 mg total) by mouth 4 (four) times daily.  28 capsule  0  . atorvastatin (LIPITOR) 10 MG tablet Take 10 mg by mouth at bedtime.      . Calcium-Phosphorus-Vitamin D (CALCIUM GUMMIES) 250-100-500 MG-MG-UNIT CHEW Chew 1 each by mouth 2 (two) times daily. Gummies Calcium 500 mg Vitamin D 500 units Chew by mouth. Gummies Calcium 500 mg Vitamin D 500 units  60 tablet  11  . cholecalciferol (VITAMIN D) 1000 UNITS tablet Take 1 tablet (1,000 Units total) by mouth daily.      . citalopram (CELEXA) 20 MG tablet Take 1 tablet (20 mg total) by mouth daily.  30 tablet  0  . cyanocobalamin 500 MCG tablet Take 500 mcg by mouth daily.      . diphenoxylate-atropine (LOMOTIL) 2.5-0.025 MG per  tablet Take 1 tablet by mouth 4 (four) times daily as needed for diarrhea or loose stools.      . feeding supplement (ENSURE COMPLETE) LIQD Take 237 mLs by mouth 2 (two) times daily between meals.  10 Bottle  1  . fentaNYL (DURAGESIC) 100 MCG/HR Place 1 patch (100 mcg total) onto the skin every 3 (three) days.  10 patch  0  . folic acid (FOLVITE) 400 MCG tablet Take 400 mcg by mouth daily.      . furosemide (LASIX) 20 MG tablet TAKE ONE TO ONE AND ONE-HALF TABLETS (20 TO 30 MG) DAILY  90 tablet  0  . gabapentin (NEURONTIN) 400 MG capsule Take 1 capsule (400 mg total) by mouth at bedtime.      [name] HYDROcodone-acetaminophen (NORCO/VICODIN) 5-325 MG per tablet Take 1 tablet by mouth every 6 (six) hours as needed.  30 tablet  0  . isosorbide  mononitrate (IMDUR) 15 mg TB24 24 hr tablet Take 0.5 tablets (15 mg total) by mouth daily.  30 tablet  0  . levothyroxine (SYNTHROID, LEVOTHROID) 25 MCG tablet TAKE 1 TABLET DAILY  90 tablet  3  . loperamide (IMODIUM) 2 MG capsule Take 1 capsule (2 mg total) by mouth as needed for diarrhea or loose stools.  30 capsule  0  . metoprolol succinate (TOPROL-XL) 50 MG 24 hr tablet Take 50 mg by mouth 2 (two) times daily. Take with or immediately following a meal.      . Multiple Vitamins-Minerals (MULTIVITAMIN) tablet Take 1 tablet by mouth daily.      . nitroGLYCERIN (NITROSTAT) 0.4 MG SL tablet Place 0.4 mg under the tongue every 5 (five) minutes x 3 doses as needed for chest pain.      [name] ondansetron (ZOFRAN) 4 MG tablet Take 1 tablet (4 mg total) by mouth every 12 (twelve) hours as needed for nausea. For nausea  30 tablet  1  . pantoprazole (PROTONIX) 40 MG tablet Take 40 mg by mouth daily.      . potassium chloride (K-DUR) 10 MEQ tablet Take 2 tablets (20 mEq total) by mouth daily.  60 tablet  11  . predniSONE (DELTASONE) 5 MG tablet Take 7 tablets (35 mg total) by mouth daily.  30 tablet  0  . sucralfate (CARAFATE) 1 GM/10ML suspension Take 10 mLs (1 g total) by mouth 2 (two) times daily. 8AM and 6PM  420 mL  3  . apixaban (ELIQUIS) 2.5 MG TABS tablet Take 1 tablet (2.5 mg total) by mouth 2 (two) times daily.  60 tablet  11   No current facility-administered medications for this visit.    Allergies  Allergen Reactions  . Sulfa Antibiotics Itching  . Tramadol Hcl Itching and Rash    History   Social History  . Marital Status: Married    Spouse Name: N/A    Number of Children: 6  . Years of Education: N/A   Occupational History  . Retired Futures trader    Social History Main Topics  . Smoking status: Never Smoker   . Smokeless tobacco: Never Used     Comment: Lives at [facility] since 03/2009- married, lives with spouse. Moved here from [facility] to be near son  . Alcohol  Use: No  . Drug Use: No  . Sexual Activity: Not Currently   Other Topics Concern  . Not on file   Social History Narrative  . No narrative on file    Family History  Problem  Relation Age of Onset  . Ovarian cancer Mother   . Coronary artery disease Sister   . Coronary artery disease Brother   . Hypertension Mother     grandparent  . Lung cancer Father    Physical Exam: Filed Vitals:   03/08/13 1405  BP: 129/81  Pulse: 85  Height: 5\' 1"  (1.549 m)  Weight: 154 lb (69.854 kg)    GEN- The patient is elderly appearing, alert and oriented x 3 today.   Head- normocephalic, atraumatic Eyes-  Sclera clear, conjunctiva pink Ears- hearing intact Oropharynx- clear Neck- supple, no JVP Lymph- no cervical lymphadenopathy Lungs- Clear to ausculation bilaterally, normal work of breathing Chest- pacemaker pocket is well healed Heart- irregular rate and rhythm GI- soft, NT, ND, + BS Extremities- no clubbing, cyanosis, 2+ pedal edema with marked venous insufficiency  Pacemaker interrogation- reviewed in detail today,  See PACEART report ekg today reveal afib with V pacing  Assessment and Plan:   1. afib Rate controlled She has a chads2vasc score of at least 6.  She also has an elevated HASBLED score of at least 3. Today, I discussed procs and cons of anticoagulation with the patient and her son.  Coumadin as well as  novel anticoagulants including pradaxa, xarelto, and eliquis were discussed today as indicated for risk reduction in stroke and systemic emboli with nonvalvular atrial fibrillation.  Risks, benefits, and alternatives to each of these drugs were discussed at length today.  I have advised eliquis 2.5mg  BID.  They agree with this approach.  The patient will need close follow-up and therefore I will refer to our anticoagulation clinic.  She is scheduled to follow-up with Dr Felicity Coyer in the near future.  2. Symptomatic bradycardia Normal pacemaker function See Pace Art  report No changes today  3. HTN Stable No change required today  4. Anemia Stable No change required today  Remote monitoring Return to see me in 1 year

## 2013-03-11 ENCOUNTER — Encounter: Payer: Medicare Other | Admitting: Internal Medicine

## 2013-03-13 ENCOUNTER — Other Ambulatory Visit: Payer: Self-pay

## 2013-03-15 LAB — MDC_IDC_ENUM_SESS_TYPE_INCLINIC
Battery Remaining Longevity: 1.5
Implantable Pulse Generator Model: 1290
Implantable Pulse Generator Serial Number: 785093
Lead Channel Pacing Threshold Amplitude: 0.8 V
Lead Channel Pacing Threshold Pulse Width: 0.8 ms
Lead Channel Sensing Intrinsic Amplitude: 11.5 mV
Lead Channel Setting Pacing Amplitude: 2 V
Lead Channel Setting Pacing Amplitude: 2.4 V
Lead Channel Setting Pacing Pulse Width: 0.5 ms
Lead Channel Setting Sensing Sensitivity: 2.5 mV

## 2013-03-18 ENCOUNTER — Telehealth: Payer: Self-pay | Admitting: *Deleted

## 2013-03-18 DIAGNOSIS — N390 Urinary tract infection, site not specified: Secondary | ICD-10-CM

## 2013-03-18 NOTE — Telephone Encounter (Signed)
Urine labs ordered 

## 2013-03-18 NOTE — Telephone Encounter (Signed)
Art,pts son, came in requesting a specimen cup for pt to take a urine sample.  States pt is still having painful urination which has been on going since Oct. Pt initially given 10day regiment of Cipro for UTI, then 7 day regiment of Ampicillin on 11.4.14 for same.  Please advise in Dr Diamantina Monks absence.

## 2013-03-18 NOTE — Telephone Encounter (Signed)
Called patient son back and left a voice mail instructing that labs for urine specimen had been put in and to bring specimen back for testing.

## 2013-03-19 ENCOUNTER — Telehealth: Payer: Self-pay | Admitting: *Deleted

## 2013-03-19 NOTE — Telephone Encounter (Signed)
Britta Mccreedy called requesting the status of the [place] form completion.  States the deadline to turn it in is rapidly approaching.  Please advise

## 2013-03-19 NOTE — Telephone Encounter (Signed)
Forms completed and given to VAL for signature

## 2013-03-20 ENCOUNTER — Other Ambulatory Visit (INDEPENDENT_AMBULATORY_CARE_PROVIDER_SITE_OTHER): Payer: Medicare Other

## 2013-03-20 DIAGNOSIS — N390 Urinary tract infection, site not specified: Secondary | ICD-10-CM

## 2013-03-20 LAB — URINALYSIS, ROUTINE W REFLEX MICROSCOPIC
Bilirubin Urine: NEGATIVE
Ketones, ur: NEGATIVE
Nitrite: NEGATIVE
Total Protein, Urine: NEGATIVE
Urine Glucose: NEGATIVE
Urobilinogen, UA: 0.2 (ref 0.0–1.0)

## 2013-03-21 DIAGNOSIS — Z0279 Encounter for issue of other medical certificate: Secondary | ICD-10-CM

## 2013-03-21 NOTE — Telephone Encounter (Signed)
Spoke with Nicole Roberts advised forms ready for pick up.

## 2013-03-22 ENCOUNTER — Telehealth: Payer: Self-pay | Admitting: *Deleted

## 2013-03-22 DIAGNOSIS — M069 Rheumatoid arthritis, unspecified: Secondary | ICD-10-CM

## 2013-03-22 NOTE — Telephone Encounter (Signed)
Called pt no answer LMOM order fax to [facility]...Raechel Chute

## 2013-03-22 NOTE — Telephone Encounter (Signed)
Order printed and signed.

## 2013-03-22 NOTE — Telephone Encounter (Signed)
Left msg on vm requesting a order for cushion for her wheelchair. Pls [facility] to [facility]...Raechel Chute

## 2013-03-24 ENCOUNTER — Other Ambulatory Visit: Payer: Self-pay | Admitting: Internal Medicine

## 2013-03-24 ENCOUNTER — Encounter: Payer: Self-pay | Admitting: Internal Medicine

## 2013-03-24 MED ORDER — AMPICILLIN 500 MG PO CAPS: 500.0000 mg | ORAL_CAPSULE | Freq: Four times a day (QID) | ORAL | Status: DC

## 2013-03-25 ENCOUNTER — Other Ambulatory Visit: Payer: Self-pay | Admitting: Internal Medicine

## 2013-03-26 ENCOUNTER — Telehealth: Payer: Self-pay

## 2013-03-26 ENCOUNTER — Other Ambulatory Visit: Payer: Self-pay | Admitting: *Deleted

## 2013-03-26 ENCOUNTER — Encounter: Payer: Self-pay | Admitting: Internal Medicine

## 2013-03-26 ENCOUNTER — Other Ambulatory Visit: Payer: Self-pay | Admitting: Internal Medicine

## 2013-03-26 DIAGNOSIS — A491 Streptococcal infection, unspecified site: Secondary | ICD-10-CM | POA: Insufficient documentation

## 2013-03-26 DIAGNOSIS — N390 Urinary tract infection, site not specified: Secondary | ICD-10-CM | POA: Insufficient documentation

## 2013-03-26 LAB — CULTURE, URINE COMPREHENSIVE: Colony Count: 85000

## 2013-03-26 MED ORDER — CITALOPRAM HYDROBROMIDE 20 MG PO TABS: 20.0000 mg | ORAL_TABLET | Freq: Every day | ORAL | Status: DC

## 2013-03-26 MED ORDER — ATORVASTATIN CALCIUM 10 MG PO TABS: 10.0000 mg | ORAL_TABLET | Freq: Every day | ORAL | Status: DC

## 2013-03-26 MED ORDER — FOLIC ACID 400 MCG PO TABS: 400.0000 ug | ORAL_TABLET | Freq: Every day | ORAL | Status: DC

## 2013-03-26 MED ORDER — PANTOPRAZOLE SODIUM 40 MG PO TBEC: 40.0000 mg | DELAYED_RELEASE_TABLET | Freq: Every day | ORAL | Status: DC

## 2013-03-26 MED ORDER — AMPICILLIN 500 MG PO CAPS: 500.0000 mg | ORAL_CAPSULE | Freq: Four times a day (QID) | ORAL | Status: DC

## 2013-03-26 MED ORDER — LEVOTHYROXINE SODIUM 25 MCG PO TABS: ORAL_TABLET | ORAL | Status: DC

## 2013-03-26 MED ORDER — PREDNISONE 5 MG PO TABS: 35.0000 mg | ORAL_TABLET | Freq: Every day | ORAL | Status: DC

## 2013-03-26 MED ORDER — METOPROLOL SUCCINATE ER 50 MG PO TB24: 50.0000 mg | ORAL_TABLET | Freq: Two times a day (BID) | ORAL | Status: DC

## 2013-03-26 NOTE — Telephone Encounter (Signed)
I have sent a referral to [facility]

## 2013-03-26 NOTE — Telephone Encounter (Signed)
Lab called stating that Patient tested positive for VRE.

## 2013-04-05 ENCOUNTER — Other Ambulatory Visit: Payer: Self-pay | Admitting: Internal Medicine

## 2013-04-08 ENCOUNTER — Telehealth: Payer: Self-pay

## 2013-04-08 NOTE — Telephone Encounter (Signed)
Molinda Bailiff, PT w/ [facility] called to report lower left leg swelling that causes pain and transfer difficulty. He is requesting verbal order for nurse to come and assist with compression wrapping for pt. He also mention that MD may want to temporarily increase Lasix. Please advise. Thanks

## 2013-04-08 NOTE — Telephone Encounter (Signed)
Ok for nursing order (verbal) as requested May increase furosemide to 40mg  daily x 5 days, then resume 20mg  daily

## 2013-04-09 NOTE — Telephone Encounter (Signed)
Faxed message with md response concerning lasix...Raechel Chute

## 2013-04-09 NOTE — Telephone Encounter (Signed)
Returned call back to [facility] and advised per MD. Per Cornelius Moras, medication orders will need to be faxed to the residential facility med room  ([facility]) at [phone].

## 2013-04-10 ENCOUNTER — Ambulatory Visit: Payer: Medicare Other | Admitting: Internal Medicine

## 2013-04-15 ENCOUNTER — Telehealth: Payer: Self-pay | Admitting: *Deleted

## 2013-04-15 DIAGNOSIS — R350 Frequency of micturition: Secondary | ICD-10-CM

## 2013-04-15 NOTE — Telephone Encounter (Signed)
Pt called states she feels like she has to urinate every 30 minutes.  Symptoms started today.  Please advise

## 2013-04-15 NOTE — Telephone Encounter (Signed)
We need to check urine sample to look for UTI. Order for same entered into EMR. If abnormal UA, will call in antibiotics to treat UTI as needed. Until then, use AZO (otc medication) per box instructions to help with burning symptoms

## 2013-04-16 ENCOUNTER — Ambulatory Visit (INDEPENDENT_AMBULATORY_CARE_PROVIDER_SITE_OTHER): Payer: Medicare Other | Admitting: Internal Medicine

## 2013-04-16 ENCOUNTER — Encounter: Payer: Self-pay | Admitting: Internal Medicine

## 2013-04-16 VITALS — BP 121/70 | HR 92 | Temp 97.2°F

## 2013-04-16 DIAGNOSIS — R350 Frequency of micturition: Secondary | ICD-10-CM

## 2013-04-16 NOTE — Telephone Encounter (Signed)
Patient's son called back and he was given the instructions from Dr. Felicity Coyer. He stated they would come by today to leave the urine sample. JG//CMA

## 2013-04-16 NOTE — Progress Notes (Signed)
Subjective:    Patient ID: Nicole Roberts, female    DOB: Nov 11, 1921, 79 y.o.   MRN: [id number]  HPI 79 yo F with recurrent cellulitis which has has had treated at least 3 times this year. including may, July, and most severe hospitalizaiton in august 14th, where she had sepsis requiring ICU.  5/13-28 hospitalization, 5 d of vanco, piptazo. Discharged on cipro and doxycycline right leg 7/26 ED visit for cellulitis IV cipro, and doxycycline oral treatment of right leg 8/14 admitted for sepsis due to left leg cellulitis. She was treated with prolonged IV antibiotics and changed to oral antibiotics. She has been symptom free since then. She is referred to our clinic for evaluation of how to prevent further episodes. She states that her left leg still gets occasionally sore but not erythematous  Allergies  Allergen Reactions  . Sulfa Antibiotics Itching  . Tramadol Hcl Itching and Rash   Current Outpatient Prescriptions on File Prior to Visit  Medication Sig Dispense Refill  . acetaminophen (TYLENOL) 500 MG tablet Take 500 mg by mouth every 6 (six) hours as needed for pain. For pain      . Alum & Mag Hydroxide-Simeth (MAGIC MOUTHWASH W/LIDOCAINE) SOLN Take 5 mLs by mouth 4 (four) times daily.  5 mL  0  . ampicillin (PRINCIPEN) 500 MG capsule Take 1 capsule (500 mg total) by mouth 4 (four) times daily.  40 capsule  1  . apixaban (ELIQUIS) 2.5 MG TABS tablet Take 1 tablet (2.5 mg total) by mouth 2 (two) times daily.  60 tablet  11  . atorvastatin (LIPITOR) 10 MG tablet Take 1 tablet (10 mg total) by mouth at bedtime.  30 tablet  6  . Calcium-Phosphorus-Vitamin D (CALCIUM GUMMIES) 250-100-500 MG-MG-UNIT CHEW Chew 1 each by mouth 2 (two) times daily. Gummies Calcium 500 mg Vitamin D 500 units Chew by mouth. Gummies Calcium 500 mg Vitamin D 500 units  60 tablet  11  . CARAFATE 1 GM/10ML suspension GIVE 10 ML BY MOUTH TWICE DAILY.  600 mL  0  . cholecalciferol (VITAMIN D) 1000 UNITS tablet Take 1  capsule by mouth once daily  30 tablet  11  . citalopram (CELEXA) 20 MG tablet Take 1 tablet (20 mg total) by mouth daily.  30 tablet  0  . cyanocobalamin 500 MCG tablet Take 500 mcg by mouth daily.      . diphenoxylate-atropine (LOMOTIL) 2.5-0.025 MG per tablet Take 1 tablet by mouth 4 (four) times daily as needed for diarrhea or loose stools.      . feeding supplement (ENSURE COMPLETE) LIQD Take 237 mLs by mouth 2 (two) times daily between meals.  10 Bottle  1  . fentaNYL (DURAGESIC) 100 MCG/HR Place 1 patch (100 mcg total) onto the skin every 3 (three) days.  10 patch  0  . folic acid (FOLVITE) 400 MCG tablet Take 1 tablet (400 mcg total) by mouth daily.  30 tablet  6  . furosemide (LASIX) 20 MG tablet TAKE ONE TO ONE AND ONE-HALF TABLETS (20 TO 30 MG) DAILY  90 tablet  0  . gabapentin (NEURONTIN) 400 MG capsule Take 1 capsule (400 mg total) by mouth at bedtime.      [name] HYDROcodone-acetaminophen (NORCO/VICODIN) 5-325 MG per tablet Take 1 tablet by mouth every 6 (six) hours as needed.  30 tablet  0  . isosorbide mononitrate (IMDUR) 15 mg TB24 24 hr tablet Take 0.5 tablets (15 mg total) by mouth daily.  30 tablet  0  . levothyroxine (SYNTHROID, LEVOTHROID) 25 MCG tablet TAKE 1 TABLET DAILY  30 tablet  6  . loperamide (IMODIUM) 2 MG capsule Take 1 capsule (2 mg total) by mouth as needed for diarrhea or loose stools.  30 capsule  0  . metoprolol succinate (TOPROL-XL) 50 MG 24 hr tablet Take 1 tablet (50 mg total) by mouth 2 (two) times daily. Take with or immediately following a meal.  60 tablet  6  . Multiple Vitamin (DAILY VITE) TABS TAKE ONE TABLET BY MOUTH ONCE DAILY.  30 tablet  11  . Multiple Vitamins-Minerals (MULTIVITAMIN) tablet Take 1 tablet by mouth daily.      . nitroGLYCERIN (NITROSTAT) 0.4 MG SL tablet Place 0.4 mg under the tongue every 5 (five) minutes x 3 doses as needed for chest pain.      [name] ondansetron (ZOFRAN) 4 MG tablet Take 1 tablet (4 mg total) by mouth every 12 (twelve) hours  as needed for nausea. For nausea  30 tablet  1  . pantoprazole (PROTONIX) 40 MG tablet Take 1 tablet (40 mg total) by mouth daily.  60 tablet  6  . potassium chloride (K-DUR) 10 MEQ tablet Take 2 tablets (20 mEq total) by mouth daily.  60 tablet  11  . predniSONE (DELTASONE) 5 MG tablet Take 7 tablets (35 mg total) by mouth daily.  30 tablet  6   No current facility-administered medications on file prior to visit.   Active Ambulatory Problems    Diagnosis Date Noted  . HYPOTHYROIDISM 05/21/2009  . HYPERLIPIDEMIA 05/21/2009  . HYPERTENSION 05/21/2009  . Acute on chronic systolic CHF (congestive heart failure) 03/18/2009  . GERD 05/21/2009  . Rheumatoid arthritis(714.0) 01/26/2010  . Edema 03/19/2009  . Macrocytic anemia 05/16/2011  . Atrial fibrillation 02/05/2012  . Stage III chronic kidney disease 04/06/2012  . Left lower extremity cellulitis in the setting of chronic venous stasis/edema 09/16/2012  . Depression   . Hypokalemia 12/13/2012  . Severe sepsis with septic shock 12/14/2012  . Thrombocytopenia, unspecified 12/20/2012  . Hyperkalemia 12/20/2012  . Symptomatic bradycardia 03/08/2013  . UTI 03/26/2013  . VRE (vancomycin-resistant Enterococci) infection 03/26/2013   Resolved Ambulatory Problems    Diagnosis Date Noted  . VIRAL GASTROENTERITIS 03/02/2010  . BREAST CANCER 03/18/2009  . HEARING LOSS 06/24/2009  . MITRAL VALVE INSUFF&AORTIC VALVE INSUFF 03/18/2009  . PULMONARY HYPERTENSION 03/18/2009  . ATRIAL FIBRILLATION 03/19/2009  . SICK SINUS SYNDROME 03/18/2009  . Cardiomegaly 03/18/2009  . UTI 01/26/2010  . ARTHRITIS 05/21/2009  . HOARSENESS 05/22/2009  . CHEST PAIN, ATYPICAL 11/27/2009  . Diarrhea 03/10/2010  . URINARY INCONTINENCE 05/21/2009  . UTI'S, HX OF 05/21/2009  . CHICKENPOX, HX OF 05/21/2009  . PACEMAKER-[employer] 03/18/2009  . FEVER UNSPECIFIED 05/12/2010  . Encounter for long-term (current) use of anticoagulants 07/20/2010  . TIA  (transient ischemic attack) 05/16/2011  . Tachy-brady syndrome 03/20/2012  . Anemia, macrocytic 03/20/2012  . Loss of weight 10/18/2012   Past Medical History  Diagnosis Date  . Diastolic dysfunction   . Hyperparathyroidism   . Venous insufficiency   . Spinal stenosis   . Anemia   . Anxiety    History  Substance Use Topics  . Smoking status: Never Smoker   . Smokeless tobacco: Never Used     Comment: Lives at [facility] since 03/2009- married, lives with spouse. Moved here from [facility] to be near son  . Alcohol Use: No  family history includes Coronary artery disease in her  brother and sister; Hypertension in her mother; Lung cancer in her father; Ovarian cancer in her mother.   Review of Systems  Constitutional: Negative for fever, chills, diaphoresis, activity change, appetite change, fatigue and unexpected weight change.  HENT: Negative for congestion, sore throat, rhinorrhea, sneezing, trouble swallowing and sinus pressure.  Eyes: Negative for photophobia and visual disturbance.  Respiratory: Negative for cough, chest tightness, shortness of breath, wheezing and stridor.  Cardiovascular: Negative for chest pain, palpitations and leg swelling.  Gastrointestinal: Negative for nausea, vomiting, abdominal pain, diarrhea, constipation, blood in stool, abdominal distention and anal bleeding.  Genitourinary: Negative for dysuria, hematuria, flank pain and difficulty urinating.  Musculoskeletal: left leg pain that she describes as burning sensation Skin: Negative for color change, pallor, rash and wound.  Neurological: Negative for dizziness, tremors, weakness and light-headedness.  Hematological: Negative for adenopathy. Does not bruise/bleed easily.  Psychiatric/Behavioral: Negative for behavioral problems, confusion, sleep disturbance, dysphoric mood, decreased concentration and agitation.       Objective:   Physical Exam BP 121/70  Pulse 92  Temp(Src) 97.2  F (36.2 C) (Oral)  Constitutional: oriented to person, place, and time. appears well-developed and well-nourished. No distress.  HENT:  Mouth/Throat: Oropharynx is clear and moist. No oropharyngeal exudate.  Cardiovascular: Normal rate, regular rhythm and normal heart sounds. Exam reveals no gallop and no friction rub.  No murmur heard.  Pulmonary/Chest: Effort normal and breath sounds normal. No respiratory distress.  no wheezes.  Abdominal: Soft. Bowel sounds are normal. exhibits no distension. There is no tenderness.  Lymphadenopathy: no cervical adenopathy.  Neurological:  alert and oriented to person, place, and time.  Skin: Skin is warm and dry. No rash noted. No erythema. Does has some changes consistent with venous stasis.  Ext: trace/+1 edema to left leg Psychiatric: a normal mood and affect. behavior is normal.      Assessment & Plan:  Resolved cellulitis = not requiring antibiotics at this time  Possible neuropathy = she appears to be on low dose neurontin and can titrate up to see if any improvement in symptos. Recommend that she follow up with her PCP next week for evaluation of pain.  rtc PRN if she has recurrence of cellulitis

## 2013-04-16 NOTE — Telephone Encounter (Signed)
Unable to contact pt, no VM. 

## 2013-04-17 LAB — URINE CULTURE: Organism ID, Bacteria: NO GROWTH

## 2013-04-17 LAB — URINALYSIS, ROUTINE W REFLEX MICROSCOPIC
Glucose, UA: NEGATIVE mg/dL
Hgb urine dipstick: NEGATIVE
Ketones, ur: NEGATIVE mg/dL
Leukocytes, UA: NEGATIVE
pH: 6.5 (ref 5.0–8.0)

## 2013-04-19 ENCOUNTER — Ambulatory Visit (INDEPENDENT_AMBULATORY_CARE_PROVIDER_SITE_OTHER): Payer: Medicare Other | Admitting: *Deleted

## 2013-04-19 ENCOUNTER — Encounter: Payer: Medicare Other | Admitting: Pharmacist

## 2013-04-19 DIAGNOSIS — I4891 Unspecified atrial fibrillation: Secondary | ICD-10-CM

## 2013-04-19 LAB — CBC
HCT: 32 % — ABNORMAL LOW (ref 36.0–46.0)
Hemoglobin: 10.3 g/dL — ABNORMAL LOW (ref 12.0–15.0)
Platelets: 146 10*3/uL — ABNORMAL LOW (ref 150.0–400.0)
RBC: 3.06 Mil/uL — ABNORMAL LOW (ref 3.87–5.11)
RDW: 20 % — ABNORMAL HIGH (ref 11.5–14.6)
WBC: 8.8 10*3/uL (ref 4.5–10.5)

## 2013-04-19 LAB — BASIC METABOLIC PANEL
Chloride: 101 mEq/L (ref 96–112)
Potassium: 3.4 mEq/L — ABNORMAL LOW (ref 3.5–5.1)
Sodium: 141 mEq/L (ref 135–145)

## 2013-04-19 NOTE — Progress Notes (Signed)
Pt was started on Eliquis for Afib on 03/09/13.    Reviewed patients medication list.  Pt is not currently on any combined P-gp and strong CYP3A4 inhibitors/inducers (ketoconazole, traconazole, ritonavir, carbamazepine, phenytoin, rifampin, St. John's wort).  Reviewed labs.  SCr 1.0, Weight 69Kg, Age 81yrs.  Dose appropriate based on Dr Jenel Lucks recomendation of 2.5mg s BID. Also pt's dry weight is <60Kg.   Hgb and HCT 10.3/32.0.   A full discussion of the nature of anticoagulants has been carried out.  A benefit/risk analysis has been presented to the patient, so that they understand the justification for choosing anticoagulation with Eliquis at this time.  The need for compliance is stressed.  Pt is aware to take the medication twice daily.  Side effects of potential bleeding are discussed, including unusual colored urine or stools, coughing up blood or coffee ground emesis, nose bleeds or serious fall or head trauma.  Discussed signs and symptoms of stroke. The patient should avoid any OTC items containing aspirin or ibuprofen.  Avoid alcohol consumption.   Call if any signs of abnormal bleeding.  Discussed financial obligations and resolved any difficulty in obtaining medication.  Next lab test test in 6 months.

## 2013-04-23 ENCOUNTER — Other Ambulatory Visit: Payer: Self-pay | Admitting: Internal Medicine

## 2013-05-06 ENCOUNTER — Other Ambulatory Visit: Payer: Self-pay | Admitting: Internal Medicine

## 2013-05-07 DIAGNOSIS — R269 Unspecified abnormalities of gait and mobility: Secondary | ICD-10-CM

## 2013-05-07 DIAGNOSIS — M48061 Spinal stenosis, lumbar region without neurogenic claudication: Secondary | ICD-10-CM

## 2013-05-07 DIAGNOSIS — I129 Hypertensive chronic kidney disease with stage 1 through stage 4 chronic kidney disease, or unspecified chronic kidney disease: Secondary | ICD-10-CM

## 2013-05-07 DIAGNOSIS — R609 Edema, unspecified: Secondary | ICD-10-CM

## 2013-05-07 DIAGNOSIS — I509 Heart failure, unspecified: Secondary | ICD-10-CM

## 2013-05-09 ENCOUNTER — Telehealth: Payer: Self-pay | Admitting: *Deleted

## 2013-05-09 MED ORDER — FENTANYL 100 MCG/HR TD PT72: 100.0000 ug | MEDICATED_PATCH | TRANSDERMAL | Status: DC

## 2013-05-09 NOTE — Telephone Encounter (Signed)
Called [facility] spoke with Amber/med tech inform her md did approve patch, but the script has to be pick up. She states she will have pt sone to come pick up. Place rx  in cabinet...Nicole Roberts

## 2013-05-09 NOTE — Telephone Encounter (Signed)
Sent fax requesting a hard script for fentanyl patches...Nicole Roberts

## 2013-05-09 NOTE — Telephone Encounter (Signed)
Printed and signed.  

## 2013-05-21 ENCOUNTER — Telehealth: Payer: Self-pay | Admitting: *Deleted

## 2013-05-21 NOTE — Telephone Encounter (Signed)
Nurse from [facility] phoned to inform PCP that several of the residents, as well as staff, had influenza A & B.  It was an [employer], but stated that if you have further orders, please advise.

## 2013-05-22 MED ORDER — OSELTAMIVIR PHOSPHATE 75 MG PO CAPS: 75.0000 mg | ORAL_CAPSULE | Freq: Every day | ORAL | Status: DC

## 2013-05-22 NOTE — Telephone Encounter (Signed)
Noted -  Okay for Tamiflu prophylaxis: 75 mg by mouth daily x10 days erx done

## 2013-05-29 ENCOUNTER — Encounter: Payer: Self-pay | Admitting: Internal Medicine

## 2013-05-29 ENCOUNTER — Other Ambulatory Visit (INDEPENDENT_AMBULATORY_CARE_PROVIDER_SITE_OTHER): Payer: Medicare Other

## 2013-05-29 ENCOUNTER — Ambulatory Visit (INDEPENDENT_AMBULATORY_CARE_PROVIDER_SITE_OTHER): Payer: Medicare Other | Admitting: Internal Medicine

## 2013-05-29 VITALS — BP 138/74 | HR 75 | Temp 98.3°F

## 2013-05-29 DIAGNOSIS — M24873 Other specific joint derangements of unspecified ankle, not elsewhere classified: Secondary | ICD-10-CM

## 2013-05-29 DIAGNOSIS — L909 Atrophic disorder of skin, unspecified: Secondary | ICD-10-CM

## 2013-05-29 DIAGNOSIS — M25371 Other instability, right ankle: Secondary | ICD-10-CM

## 2013-05-29 DIAGNOSIS — I10 Essential (primary) hypertension: Secondary | ICD-10-CM

## 2013-05-29 DIAGNOSIS — L918 Other hypertrophic disorders of the skin: Secondary | ICD-10-CM

## 2013-05-29 DIAGNOSIS — R350 Frequency of micturition: Secondary | ICD-10-CM

## 2013-05-29 DIAGNOSIS — M24876 Other specific joint derangements of unspecified foot, not elsewhere classified: Secondary | ICD-10-CM

## 2013-05-29 DIAGNOSIS — L919 Hypertrophic disorder of the skin, unspecified: Secondary | ICD-10-CM

## 2013-05-29 LAB — URINALYSIS, ROUTINE W REFLEX MICROSCOPIC
BILIRUBIN URINE: NEGATIVE
Hgb urine dipstick: NEGATIVE
KETONES UR: NEGATIVE
Nitrite: NEGATIVE
Specific Gravity, Urine: 1.015 (ref 1.000–1.030)
Total Protein, Urine: NEGATIVE
UROBILINOGEN UA: 0.2 (ref 0.0–1.0)
Urine Glucose: NEGATIVE
pH: 6 (ref 5.0–8.0)

## 2013-05-29 MED ORDER — CIPROFLOXACIN HCL 250 MG PO TABS: 250.0000 mg | ORAL_TABLET | Freq: Two times a day (BID) | ORAL | Status: DC

## 2013-05-29 NOTE — Assessment & Plan Note (Signed)
ACE inhibitor discontinued because of progressive renal failure Remains on beta blocker as for A. Fib rate control Asymptomatic at this time Continue same pending further cardiology review  BP Readings from Last 3 Encounters:  05/29/13 138/74  04/16/13 121/70  03/08/13 129/81

## 2013-05-29 NOTE — Patient Instructions (Signed)
It was good to see you today.  We have reviewed your prior records including labs and tests today  Medications reviewed and updated -See list below  Prescription for right ankle AFO given to you to take to [facility]  Test(s) ordered today. Your results will be released to [facility] (or called to you) after review, usually within 72hours after test completion. If any changes need to be made, you will be notified at that same time.  Please schedule followup in 3-4 months, call sooner if problems.

## 2013-05-29 NOTE — Addendum Note (Signed)
Addended by: Gwendolyn Grant A on: 05/29/2013 05:32 PM   Modules accepted: Orders

## 2013-05-29 NOTE — Progress Notes (Signed)
Subjective:    Patient ID: Nicole Roberts, female    DOB: 1921-07-28, 80 y.o.   MRN: [id number]  HPI   Patient is here today for follow-up - family with multiple questions about medications.  Pt now in assisted living, improving endurance and strength with physical therapy, but needs right ankle brace.   Chronic medical issues reviewed - meds reviewed and reconciled    Past Medical History  Diagnosis Date  . HEARING LOSS   . MITRAL VALVE INSUFF&AORTIC VALVE INSUFF     moderate MR  . PULMONARY HYPERTENSION   . Atrial fibrillation     permanent  . SICK SINUS SYNDROME     a. Tachybrady syndrome - Guidant PPM 10/2005.  . Edema 03/19/2009    due to venous insufficiency, chronic lymphedema  . URINARY INCONTINENCE   . Rheumatoid arthritis(714.0) dx clarified 2011    On prednisone  . BREAST CANCER 12/2006    ductal ca s/p L lumpectomy  . Diastolic dysfunction   . HYPERTENSION   . HYPERLIPIDEMIA   . GERD   . HYPOTHYROIDISM   . Hyperparathyroidism     2 lobes removed  . Venous insufficiency     chronic BLE edema  . Spinal stenosis   . Anemia     a. Macrocytic - normal B12, folate 08/2011. b. 3/6 heme positive stools 10/2011 - per PCP note, elected against colonscopy.  [name] TIA (transient ischemic attack) 05/2011  . Anxiety      Review of Systems  Constitutional: Negative for fever, chills and activity change.  HENT: Negative for congestion, ear pain, rhinorrhea and sinus pressure.   Respiratory: Negative for chest tightness and shortness of breath.   Cardiovascular: Positive for leg swelling (chronic). Negative for chest pain and palpitations.  Gastrointestinal: Positive for constipation (chronic). Negative for nausea, vomiting and diarrhea.  Endocrine: Negative for cold intolerance, heat intolerance, polydipsia and polyuria.  Neurological: Negative for dizziness, syncope and headaches.       Objective:   Physical Exam BP 138/74  Pulse 75  Temp(Src) 98.3 F (36.8 C)  (Oral)  SpO2 96% Wt Readings from Last 3 Encounters:  03/08/13 154 lb (69.854 kg)  12/24/12 156 lb 8.4 oz (71 kg)  11/28/12 148 lb 6.4 oz (67.314 kg)   Constitutional: She appears well-developed and well-nourished. No distress. son at side Cardiovascular: Normal rate, regular rhythm and normal heart sounds.  No murmur heard. chronic BLE edema, currently improved compared to baseline -wearing compression hose bilaterally Pulmonary/Chest: Effort normal and breath sounds normal. No respiratory distress. She has no wheeze. Skin - irritated skin tag L anterior neck - liquid nitrogen applied today MSkel - unstable R ankle (chronic) Psychiatric: She has a normal mood and affect. Her behavior is normal. Judgment and thought content normal.   Lab Results  Component Value Date   WBC 8.8 04/19/2013   HGB 10.3* 04/19/2013   HCT 32.0* 04/19/2013   PLT 146.0* 04/19/2013   GLUCOSE 94 04/19/2013   CHOL 143 03/20/2012   TRIG 77 03/20/2012   HDL 70 03/20/2012   LDLDIRECT 131.4 12/28/2009   LDLCALC 58 03/20/2012   ALT 18 12/13/2012   AST 20 12/13/2012   NA 141 04/19/2013   K 3.4* 04/19/2013   CL 101 04/19/2013   CREATININE 1.0 04/19/2013   BUN 21 04/19/2013   CO2 30 04/19/2013   TSH 2.10 02/25/2013   INR 1.27 03/19/2012      Assessment & Plan:   See problem list -  R ankle instability - recommended for AFO by her PT - rx given to family to take to [facility]  Skin tag -liquid nitrogen freeze therapy for destruction today. Aftercare provided. Patient will call if continued problems or recurrent issue  Urinary frequency, chronic issue. Discussed behavioral techniques of frequent trips to the bathroom every 1-2 hours despite lack of urge. We'll check urinalysis to exclude recurrent UTI. Patient/family declined need for other medications, specifically no treatment desired for potential overactive bladder  Time spent with pt/family today 25 minutes, greater than 50% time spent counseling patient  on interval event and medication review. Also review of prior records

## 2013-05-29 NOTE — Progress Notes (Signed)
Pre-visit discussion using our clinic review tool. No additional management support is needed unless otherwise documented below in the visit note.  

## 2013-05-30 ENCOUNTER — Telehealth: Payer: Self-pay | Admitting: *Deleted

## 2013-05-30 NOTE — Telephone Encounter (Signed)
Son called because he had concerns of a $300.00 bill for eliquis, I called [facility] for PA, they states no PA needed, the high $$$ was due to patient getting medication from local pharmacy vs mail order, she is also over refill limit, I called son and infomed him of this issue. He needs to call [phone] [facility]

## 2013-06-21 ENCOUNTER — Telehealth: Payer: Self-pay | Admitting: Internal Medicine

## 2013-06-21 NOTE — Telephone Encounter (Signed)
Requesting continuience to help her walk for the next 2-3 weeks in the AFO.

## 2013-06-24 NOTE — Telephone Encounter (Signed)
Called [facility] no answer LMOM with md response...Johny Chess

## 2013-06-24 NOTE — Telephone Encounter (Signed)
Verbal ok. thanks

## 2013-06-28 ENCOUNTER — Other Ambulatory Visit: Payer: Self-pay | Admitting: Internal Medicine

## 2013-07-01 ENCOUNTER — Other Ambulatory Visit: Payer: Medicare Other

## 2013-07-01 ENCOUNTER — Telehealth: Payer: Self-pay | Admitting: *Deleted

## 2013-07-01 DIAGNOSIS — R350 Frequency of micturition: Secondary | ICD-10-CM

## 2013-07-01 DIAGNOSIS — R300 Dysuria: Secondary | ICD-10-CM

## 2013-07-01 NOTE — Telephone Encounter (Signed)
Phoned and left voicemail message for son with CMA's response.

## 2013-07-01 NOTE — Telephone Encounter (Signed)
Per md protocol ok for UA. Order has been place...Nicole Roberts

## 2013-07-01 NOTE — Telephone Encounter (Signed)
Patient's son phoned to state that for the past ~ 6-8 weeks, patient has had UTI and continues to have sxs-burning with urination, frequency.  Requests follow up urinalysis.  Please advise.  Last OV with PCP 05/29/13 and last UA 05/29/13.  CB# [phone]

## 2013-07-02 ENCOUNTER — Other Ambulatory Visit (INDEPENDENT_AMBULATORY_CARE_PROVIDER_SITE_OTHER): Payer: Medicare Other

## 2013-07-02 ENCOUNTER — Encounter: Payer: Self-pay | Admitting: Internal Medicine

## 2013-07-02 ENCOUNTER — Ambulatory Visit (INDEPENDENT_AMBULATORY_CARE_PROVIDER_SITE_OTHER): Payer: Medicare Other | Admitting: Internal Medicine

## 2013-07-02 VITALS — BP 110/70 | HR 89 | Temp 98.2°F

## 2013-07-02 DIAGNOSIS — R300 Dysuria: Secondary | ICD-10-CM

## 2013-07-02 DIAGNOSIS — R350 Frequency of micturition: Secondary | ICD-10-CM

## 2013-07-02 DIAGNOSIS — Z87440 Personal history of urinary (tract) infections: Secondary | ICD-10-CM

## 2013-07-02 LAB — URINALYSIS
Bilirubin Urine: NEGATIVE
KETONES UR: NEGATIVE
NITRITE: NEGATIVE
PH: 7 (ref 5.0–8.0)
Specific Gravity, Urine: 1.01 (ref 1.000–1.030)
Total Protein, Urine: NEGATIVE
Urine Glucose: NEGATIVE
Urobilinogen, UA: 0.2 (ref 0.0–1.0)

## 2013-07-02 LAB — URINALYSIS, ROUTINE W REFLEX MICROSCOPIC
Bilirubin Urine: NEGATIVE
Ketones, ur: NEGATIVE
Nitrite: NEGATIVE
Specific Gravity, Urine: 1.01 (ref 1.000–1.030)
Total Protein, Urine: NEGATIVE
URINE GLUCOSE: NEGATIVE
UROBILINOGEN UA: 0.2 (ref 0.0–1.0)
pH: 6.5 (ref 5.0–8.0)

## 2013-07-02 MED ORDER — PHENAZOPYRIDINE HCL 100 MG PO TABS: 100.0000 mg | ORAL_TABLET | Freq: Three times a day (TID) | ORAL | Status: DC | PRN

## 2013-07-02 NOTE — Progress Notes (Signed)
   Subjective:    Patient ID: Nicole Roberts, female    DOB: Sep 26, 1921, 80 y.o.   MRN: [id number]  HPI Her symptoms began in mid November 2014 and have been recurrent in nature. The main symptoms include dysuria and frequency of urination in the past 2 weeks. She describes going to the bathroom every 10-15 minutes. She received ampicillin and Cipro during this period.  She had seen Dr. Graylon Good, [facility] specialist in December 2014 for cellulitis. Her urinalysis here on 05/29/13 was not cultured  A urine culture 03/20/13 revealed vancomycin-resistant enterococcus 85,000 colonies. That was sensitive only to [place] She denies fever , chills, & sweats. Some associated back pain without radiation anteriorly.  No associated change in color or temperature of the skin in area of symptoms  No associated rash or skin lesions present  Abnormal bruising or bleeding due Eliquus     Objective:   Physical Exam  General appearance is one of good health and nourishment w/o distress.  Eyes: No conjunctival inflammation or scleral icterus is present.  Oral exam: Dental hygiene is good; lips and gums are healthy appearing.There is no oropharyngeal erythema or exudate noted.   Heart:  Normal rate and regular rhythm. S1 and S2 normal without gallop, murmur, click, rub or other extra sounds     Lungs:Chest clear to auscultation; no wheezes, rhonchi,rales ,or rubs present.No increased work of breathing.   Abdomen: bowel sounds normal, soft and non-tender without masses, organomegaly or hernias noted.  No guarding or rebound . No tenderness over the flanks to percussion  Musculoskeletal: Able to lie flat and sit up without help. Negative straight leg raising bilaterally but pain R groin. In WC No CVA tenderness bilaterally.   Skin:Warm & dry.  Intact without suspicious lesions or rashes ; no jaundice or tenting  Lymphatic: No lymphadenopathy is noted about the head, neck,  axilla  3+ extremity edema despite compression hose             Assessment & Plan:  #1 recurrent UTI, resistant See orders

## 2013-07-02 NOTE — Progress Notes (Signed)
Pre visit review using our clinic review tool, if applicable. No additional management support is needed unless otherwise documented below in the visit note. 

## 2013-07-02 NOTE — Assessment & Plan Note (Signed)
Urinalysis and culture and sensitivity.  Urologic evaluation should be considered to rule out  a predisposition to the recurrent urinary tract infections.

## 2013-07-02 NOTE — Patient Instructions (Signed)
I recommend a Urology consultation to determine optimal therapy; please inform me if you have a physician preference.

## 2013-07-03 ENCOUNTER — Telehealth: Payer: Self-pay | Admitting: *Deleted

## 2013-07-03 ENCOUNTER — Other Ambulatory Visit: Payer: Self-pay | Admitting: Internal Medicine

## 2013-07-03 DIAGNOSIS — Z87440 Personal history of urinary (tract) infections: Secondary | ICD-10-CM

## 2013-07-03 LAB — CULTURE, URINE COMPREHENSIVE: Colony Count: 30000

## 2013-07-03 NOTE — Telephone Encounter (Signed)
Nicole Roberts, with [facility], phoned inquiring on status of faxed PT verbal order (already received VO, just needed ordering provider to sign faxed in orders to validate verbal).  Explained PCP was out of office this week, and she said since ordering MD had to sign form, she'd wait until next week to re-fax form.

## 2013-07-03 NOTE — Telephone Encounter (Signed)
There was 2 urine order reported and Dr. Linna Darner had sent a mychart msg stating he thought another patient urine was mix in with Mrs. Distel after he spoke with the lab ([facility]) instead of them putting all the results on one order they separated the results. Dr. Linna Darner wanted me to call pt son to let him know that the urine was Mrs. Ashline. Called pt son but i spoke with daughter-in-law Pamala Hurry) and explain to her what had happen. We will give them a call back again once the urine culture comes back...Johny Chess

## 2013-07-10 DIAGNOSIS — M2550 Pain in unspecified joint: Secondary | ICD-10-CM

## 2013-07-10 DIAGNOSIS — I129 Hypertensive chronic kidney disease with stage 1 through stage 4 chronic kidney disease, or unspecified chronic kidney disease: Secondary | ICD-10-CM

## 2013-07-10 DIAGNOSIS — I509 Heart failure, unspecified: Secondary | ICD-10-CM

## 2013-07-10 DIAGNOSIS — R269 Unspecified abnormalities of gait and mobility: Secondary | ICD-10-CM

## 2013-07-10 DIAGNOSIS — IMO0001 Reserved for inherently not codable concepts without codable children: Secondary | ICD-10-CM

## 2013-07-16 ENCOUNTER — Other Ambulatory Visit: Payer: Self-pay | Admitting: Internal Medicine

## 2013-07-16 NOTE — Telephone Encounter (Signed)
Requesting Pyridium 100mg  Take 1 tablet by mouth 3 times a day as needed for pain. Last refill:07-02-13;#15,0 Last OV:07-02-13 Please advise.//AB/CMA

## 2013-07-16 NOTE — Telephone Encounter (Signed)
OK X1 F/U with Dr Francella Solian

## 2013-08-05 ENCOUNTER — Telehealth: Payer: Self-pay | Admitting: *Deleted

## 2013-08-05 NOTE — Telephone Encounter (Signed)
Nicole Roberts called requesting a Rx for shoes to be used with the ankle appliance to be sent to [facility].  Please advise

## 2013-08-05 NOTE — Telephone Encounter (Signed)
Please ask son Arnell Sieving have [facility] fax the blank rx to me - I will then sign - thanks!

## 2013-08-05 NOTE — Telephone Encounter (Signed)
Spoke with Nicole Roberts advised of MDs message

## 2013-08-29 ENCOUNTER — Ambulatory Visit: Payer: Medicare Other | Admitting: Podiatrist

## 2013-09-05 ENCOUNTER — Telehealth: Payer: Self-pay | Admitting: *Deleted

## 2013-09-05 MED ORDER — FENTANYL 100 MCG/HR TD PT72: 100.0000 ug | MEDICATED_PATCH | TRANSDERMAL | Status: DC

## 2013-09-05 NOTE — Telephone Encounter (Signed)
Done - rx on Lucy's desk

## 2013-09-05 NOTE — Telephone Encounter (Signed)
Representative from [facility] called requesting hard copy of Fentanyl Patch Rx.  Please fax Rx to [phone]

## 2013-09-05 NOTE — Telephone Encounter (Signed)
Script has been faxed

## 2013-09-19 ENCOUNTER — Encounter: Payer: Self-pay | Admitting: Internal Medicine

## 2013-10-04 ENCOUNTER — Encounter: Payer: Self-pay | Admitting: Internal Medicine

## 2013-10-10 ENCOUNTER — Encounter: Payer: Self-pay | Admitting: Podiatrist

## 2013-10-10 ENCOUNTER — Ambulatory Visit (INDEPENDENT_AMBULATORY_CARE_PROVIDER_SITE_OTHER): Payer: Medicare Other | Admitting: Podiatrist

## 2013-10-10 VITALS — BP 142/78 | HR 86 | Resp 12

## 2013-10-10 DIAGNOSIS — B351 Tinea unguium: Secondary | ICD-10-CM

## 2013-10-10 DIAGNOSIS — M79609 Pain in unspecified limb: Secondary | ICD-10-CM

## 2013-10-16 ENCOUNTER — Other Ambulatory Visit: Payer: Self-pay | Admitting: Internal Medicine

## 2013-10-17 ENCOUNTER — Ambulatory Visit (INDEPENDENT_AMBULATORY_CARE_PROVIDER_SITE_OTHER): Payer: Medicare Other | Admitting: *Deleted

## 2013-10-17 DIAGNOSIS — I480 Paroxysmal atrial fibrillation: Secondary | ICD-10-CM

## 2013-10-17 DIAGNOSIS — I4891 Unspecified atrial fibrillation: Secondary | ICD-10-CM

## 2013-10-17 DIAGNOSIS — I482 Chronic atrial fibrillation, unspecified: Secondary | ICD-10-CM

## 2013-10-17 LAB — BASIC METABOLIC PANEL
BUN: 26 mg/dL — ABNORMAL HIGH (ref 6–23)
CO2: 31 mEq/L (ref 19–32)
Calcium: 8.4 mg/dL (ref 8.4–10.5)
Chloride: 104 mEq/L (ref 96–112)
Creatinine, Ser: 1.2 mg/dL (ref 0.4–1.2)
GFR: 44.89 mL/min — AB (ref >60.00)
Glucose, Bld: 119 mg/dL — ABNORMAL HIGH (ref 70–99)
POTASSIUM: 3.8 meq/L (ref 3.5–5.1)
Sodium: 141 mEq/L (ref 135–145)

## 2013-10-17 LAB — CBC
HCT: 31.6 % — ABNORMAL LOW (ref 36.0–46.0)
HEMOGLOBIN: 10.3 g/dL — AB (ref 12.0–15.0)
MCHC: 32.6 g/dL (ref 30.0–36.0)
MCV: 108.6 fl — ABNORMAL HIGH (ref 78.0–100.0)
PLATELETS: 156 10*3/uL (ref 150.0–400.0)
RBC: 2.91 Mil/uL — AB (ref 3.87–5.11)
RDW: 20.1 % — ABNORMAL HIGH (ref 11.5–15.5)
WBC: 9.4 10*3/uL (ref 4.0–10.5)

## 2013-10-18 NOTE — Progress Notes (Signed)
Pt was started on Eliquis for Afib on 03/09/2013.    Reviewed patients medication list.  Pt is not currently on any combined P-gp and strong CYP3A4 inhibitors/inducers (ketoconazole, traconazole, ritonavir, carbamazepine, phenytoin, rifampin, St. John's wort).  Reviewed labs.  SCr 1.2, Weight 59Kg. Age 78Yrs old.  Dose appropriate 2.5mg  BID based on age and weight .   Hgb and HCT 10.3/31.6.  A full discussion of the nature of anticoagulants has been carried out.  A benefit/risk analysis has been presented to the patient, so that they understand the justification for choosing anticoagulation with Eliquis at this time.  The need for compliance is stressed.  Pt is aware to take the medication twice daily.  Side effects of potential bleeding are discussed, including unusual colored urine or stools, coughing up blood or coffee ground emesis, nose bleeds or serious fall or head trauma.  Discussed signs and symptoms of stroke. The patient should avoid any OTC items containing aspirin or ibuprofen.  Avoid alcohol consumption.   Call if any signs of abnormal bleeding.  Discussed financial obligations and resolved any difficulty in obtaining medication.  Next lab test  in 6 months on 04/17/14.

## 2013-10-18 NOTE — Progress Notes (Signed)
HPI:  Patient presents today for follow up of foot and nail care. Denies any new complaints today.  Objective:  Patients chart is reviewed. Patients nails are thickened, discolored, distrophic, friable and brittle with yellow-brown discoloration. Patient subjectively relates they are painful with shoes and with ambulation of bilateral feet.  Assessment:  Symptomatic onychomycosis  Plan:  Discussed treatment options and alternatives.  The symptomatic toenails were debrided through manual an mechanical means without complication.  Return appointment recommended at routine intervals of 3 months    Trudie Buckler, DPM

## 2013-10-21 ENCOUNTER — Other Ambulatory Visit: Payer: Self-pay | Admitting: Internal Medicine

## 2013-10-22 ENCOUNTER — Other Ambulatory Visit: Payer: Self-pay | Admitting: Internal Medicine

## 2013-10-24 ENCOUNTER — Other Ambulatory Visit: Payer: Self-pay | Admitting: Internal Medicine

## 2013-10-25 LAB — MDC_IDC_ENUM_SESS_TYPE_INCLINIC
Battery Remaining Longevity: 1.5
Brady Statistic RV Percent Paced: 40 %
Implantable Pulse Generator Serial Number: 785093
Lead Channel Impedance Value: 540 Ohm
Lead Channel Pacing Threshold Amplitude: 0.8 V
Lead Channel Pacing Threshold Pulse Width: 0.8 ms
Lead Channel Sensing Intrinsic Amplitude: 10.4 mV
Lead Channel Setting Pacing Amplitude: 2.4 V
Lead Channel Setting Sensing Sensitivity: 2.5 mV
MDC IDC SET LEADCHNL RA PACING AMPLITUDE: 2 V
MDC IDC SET LEADCHNL RV PACING PULSEWIDTH: 0.5 ms

## 2013-10-25 NOTE — Progress Notes (Signed)
Pacemaker check in clinic. Normal device function. Threshold, sensing, impedances consistent with previous measurements. Device programmed to maximize longevity. Permanent AF + Eliquis. No high ventricular rates noted. Device programmed at appropriate safety margins. Histogram distribution appropriate for patient activity level. Device programmed to optimize intrinsic conduction. Estimated longevity 1.5 years. Patient will follow up with JA in 04-2014.

## 2013-10-30 ENCOUNTER — Other Ambulatory Visit: Payer: Self-pay | Admitting: Internal Medicine

## 2013-11-02 ENCOUNTER — Emergency Department (HOSPITAL_COMMUNITY): Payer: Medicare Other

## 2013-11-02 ENCOUNTER — Emergency Department (HOSPITAL_COMMUNITY)
Admission: EM | Admit: 2013-11-02 | Discharge: 2013-11-02 | Disposition: A | Discharge status: Home / Self Care | Payer: Medicare Other | Attending: Emergency Medicine | Admitting: Emergency Medicine

## 2013-11-02 ENCOUNTER — Telehealth: Payer: Self-pay | Admitting: Physician Assistant

## 2013-11-02 ENCOUNTER — Encounter (HOSPITAL_COMMUNITY): Payer: Self-pay | Admitting: Emergency Medicine

## 2013-11-02 DIAGNOSIS — K219 Gastro-esophageal reflux disease without esophagitis: Secondary | ICD-10-CM | POA: Insufficient documentation

## 2013-11-02 DIAGNOSIS — Z853 Personal history of malignant neoplasm of breast: Secondary | ICD-10-CM | POA: Insufficient documentation

## 2013-11-02 DIAGNOSIS — M069 Rheumatoid arthritis, unspecified: Secondary | ICD-10-CM | POA: Insufficient documentation

## 2013-11-02 DIAGNOSIS — I4891 Unspecified atrial fibrillation: Secondary | ICD-10-CM | POA: Insufficient documentation

## 2013-11-02 DIAGNOSIS — E039 Hypothyroidism, unspecified: Secondary | ICD-10-CM | POA: Insufficient documentation

## 2013-11-02 DIAGNOSIS — Z8673 Personal history of transient ischemic attack (TIA), and cerebral infarction without residual deficits: Secondary | ICD-10-CM | POA: Insufficient documentation

## 2013-11-02 DIAGNOSIS — E785 Hyperlipidemia, unspecified: Secondary | ICD-10-CM | POA: Insufficient documentation

## 2013-11-02 DIAGNOSIS — Z79899 Other long term (current) drug therapy: Secondary | ICD-10-CM | POA: Insufficient documentation

## 2013-11-02 DIAGNOSIS — F411 Generalized anxiety disorder: Secondary | ICD-10-CM | POA: Insufficient documentation

## 2013-11-02 DIAGNOSIS — Z8669 Personal history of other diseases of the nervous system and sense organs: Secondary | ICD-10-CM | POA: Insufficient documentation

## 2013-11-02 DIAGNOSIS — I10 Essential (primary) hypertension: Secondary | ICD-10-CM | POA: Insufficient documentation

## 2013-11-02 DIAGNOSIS — Z9889 Other specified postprocedural states: Secondary | ICD-10-CM | POA: Insufficient documentation

## 2013-11-02 DIAGNOSIS — R55 Syncope and collapse: Secondary | ICD-10-CM | POA: Insufficient documentation

## 2013-11-02 DIAGNOSIS — IMO0002 Reserved for concepts with insufficient information to code with codable children: Secondary | ICD-10-CM | POA: Insufficient documentation

## 2013-11-02 DIAGNOSIS — D649 Anemia, unspecified: Secondary | ICD-10-CM | POA: Insufficient documentation

## 2013-11-02 LAB — URINE MICROSCOPIC-ADD ON

## 2013-11-02 LAB — CBC WITH DIFFERENTIAL/PLATELET
BASOS PCT: 1 % (ref 0–1)
Basophils Absolute: 0 10*3/uL (ref 0.0–0.1)
Eosinophils Absolute: 0.2 10*3/uL (ref 0.0–0.7)
Eosinophils Relative: 2 % (ref 0–5)
HEMATOCRIT: 32.2 % — AB (ref 36.0–46.0)
Hemoglobin: 10.5 g/dL — ABNORMAL LOW (ref 12.0–15.0)
LYMPHS PCT: 21 % (ref 12–46)
Lymphs Abs: 1.8 10*3/uL (ref 0.7–4.0)
MCH: 35.1 pg — ABNORMAL HIGH (ref 26.0–34.0)
MCHC: 32.6 g/dL (ref 30.0–36.0)
MCV: 107.7 fL — ABNORMAL HIGH (ref 78.0–100.0)
MONO ABS: 1.1 10*3/uL — AB (ref 0.1–1.0)
Monocytes Relative: 13 % — ABNORMAL HIGH (ref 3–12)
NEUTROS ABS: 5.6 10*3/uL (ref 1.7–7.7)
NEUTROS PCT: 63 % (ref 43–77)
Platelets: 129 10*3/uL — ABNORMAL LOW (ref 150–400)
RBC: 2.99 MIL/uL — ABNORMAL LOW (ref 3.87–5.11)
RDW: 16.7 % — ABNORMAL HIGH (ref 11.5–15.5)
WBC: 8.7 10*3/uL (ref 4.0–10.5)

## 2013-11-02 LAB — COMPREHENSIVE METABOLIC PANEL
ALBUMIN: 3.3 g/dL — AB (ref 3.5–5.2)
ALK PHOS: 76 U/L (ref 39–117)
ALT: 28 U/L (ref 0–35)
AST: 31 U/L (ref 0–37)
Anion gap: 13 (ref 5–15)
BUN: 20 mg/dL (ref 6–23)
CHLORIDE: 102 meq/L (ref 96–112)
CO2: 27 meq/L (ref 19–32)
CREATININE: 0.96 mg/dL (ref 0.50–1.10)
Calcium: 8.5 mg/dL (ref 8.4–10.5)
GFR calc Af Amer: 59 mL/min — ABNORMAL LOW (ref >90)
GFR, EST NON AFRICAN AMERICAN: 51 mL/min — AB (ref >90)
Glucose, Bld: 91 mg/dL (ref 70–99)
POTASSIUM: 4.4 meq/L (ref 3.7–5.3)
SODIUM: 142 meq/L (ref 137–147)
Total Bilirubin: 0.4 mg/dL (ref 0.3–1.2)
Total Protein: 6.4 g/dL (ref 6.0–8.3)

## 2013-11-02 LAB — URINALYSIS, ROUTINE W REFLEX MICROSCOPIC
Bilirubin Urine: NEGATIVE
Glucose, UA: NEGATIVE mg/dL
Hgb urine dipstick: NEGATIVE
Ketones, ur: NEGATIVE mg/dL
NITRITE: NEGATIVE
PH: 7 (ref 5.0–8.0)
Protein, ur: NEGATIVE mg/dL
SPECIFIC GRAVITY, URINE: 1.016 (ref 1.005–1.030)
Urobilinogen, UA: 1 mg/dL (ref 0.0–1.0)

## 2013-11-02 LAB — TROPONIN I

## 2013-11-02 MED ORDER — METOPROLOL SUCCINATE ER 50 MG PO TB24
50.0000 mg | ORAL_TABLET | Freq: Every day | ORAL | Status: DC
  Administered 2013-11-02: 50 mg via ORAL
  Filled 2013-11-02: qty 1

## 2013-11-02 MED ORDER — SODIUM CHLORIDE 0.9 % IV BOLUS (SEPSIS)
500.0000 mL | Freq: Once | INTRAVENOUS | Status: AC
  Administered 2013-11-02: 500 mL via INTRAVENOUS

## 2013-11-02 NOTE — ED Notes (Addendum)
Family requesting at home med BP meds be given here metropolol 50mg . Dr. Doy Mince made aware sts will speak to pt first. Family notified.

## 2013-11-02 NOTE — ED Provider Notes (Signed)
CSN: [id number]     Arrival date & time 11/02/13  0946 History   First MD Initiated Contact with Patient 11/02/13 [phone]     Chief Complaint  Patient presents with  . Hypertension  . Emesis  . Diarrhea     (Consider location/radiation/quality/duration/timing/severity/associated sxs/prior Treatment) Patient is a 80 y.o. female presenting with vomiting, diarrhea, and near-syncope.  Emesis Associated symptoms: diarrhea   Associated symptoms: no abdominal pain   Diarrhea Associated symptoms: vomiting   Associated symptoms: no abdominal pain and no fever   Near Syncope This is a new problem. Episode onset: a few hours ago. The problem occurs constantly. The problem has been gradually improving. Pertinent negatives include no chest pain, no abdominal pain and no shortness of breath. Associated symptoms comments: Nausea, vomiting, diarrhea, blurry vision (described as tunnel vision, not diplopia). Exacerbated by: sitting upright. The symptoms are relieved by rest.    Past Medical History  Diagnosis Date  . HEARING LOSS   . MITRAL VALVE INSUFF&AORTIC VALVE INSUFF     moderate MR  . PULMONARY HYPERTENSION   . Atrial fibrillation     permanent  . SICK SINUS SYNDROME     a. Tachybrady syndrome - Guidant PPM 10/2005.  . Edema 03/19/2009    due to venous insufficiency, chronic lymphedema  . URINARY INCONTINENCE   . Rheumatoid arthritis(714.0) dx clarified 2011    On prednisone  . BREAST CANCER 12/2006    ductal ca s/p L lumpectomy  . Diastolic dysfunction   . HYPERTENSION   . HYPERLIPIDEMIA   . GERD   . HYPOTHYROIDISM   . Hyperparathyroidism     2 lobes removed  . Venous insufficiency     chronic BLE edema  . Spinal stenosis   . Anemia     a. Macrocytic - normal B12, folate 08/2011. b. 3/6 heme positive stools 10/2011 - per PCP note, elected against colonscopy.  [name] TIA (transient ischemic attack) 05/2011  . Anxiety    Past Surgical History  Procedure Laterality Date  . Pacemaker  placement  2005    SSS and syncope  . Cholecystectomy  04/06/99  . Abdominal hysterectomy  1970    Partial  . Left hip arthroscopy  1995  . Right hip arthroscopy  01/2001  . Carpal tunnel release  1998    bilateral  . Breast surgery  12/2006    Left breast lupectomy- Ductal CA  . Left parathyroidectomy  07/2008  . Esophagogastroduodenoscopy N/A 10/18/2012    Procedure: ESOPHAGOGASTRODUODENOSCOPY (EGD);  Surgeon: Ladene Artist, MD;  Location: [facility];  Service: Endoscopy;  Laterality: N/A;   Family History  Problem Relation Age of Onset  . Ovarian cancer Mother   . Coronary artery disease Sister   . Coronary artery disease Brother   . Hypertension Mother     grandparent  . Lung cancer Father    History  Substance Use Topics  . Smoking status: Never Smoker   . Smokeless tobacco: Never Used     Comment: Lives at [facility] since 03/2009- married, lives with spouse. Moved here from [facility] to be near son  . Alcohol Use: No   OB History   Grav Para Term Preterm Abortions TAB SAB Ect Mult Living                 Review of Systems  Constitutional: Negative for fever.  HENT: Negative for congestion.   Respiratory: Negative for cough and shortness of breath.   Cardiovascular:  Positive for near-syncope. Negative for chest pain.  Gastrointestinal: Positive for nausea, vomiting and diarrhea. Negative for abdominal pain.  All other systems reviewed and are negative.     Allergies  Sulfa antibiotics and Tramadol hcl  Home Medications   Prior to Admission medications   Medication Sig Start Date End Date Taking? Authorizing Provider  apixaban (ELIQUIS) 2.5 MG TABS tablet Take 1 tablet (2.5 mg total) by mouth 2 (two) times daily. 03/08/13  Yes Thompson Grayer, MD  atorvastatin (LIPITOR) 10 MG tablet Take 10 mg by mouth at bedtime.   Yes Historical Provider, MD  Calcium-Phosphorus-Vitamin D (CALCIUM GUMMIES) [phone] MG-MG-UNIT CHEW Chew 1 each by mouth 2 (two)  times daily. Gummies Calcium 500 mg Vitamin D 500 units Chew by mouth. Gummies Calcium 500 mg Vitamin D 500 units 02/26/13  Yes Rowe Clack, MD  cholecalciferol (VITAMIN D) 1000 UNITS tablet Take 1,000 Units by mouth daily.   Yes Historical Provider, MD  citalopram (CELEXA) 20 MG tablet Take 20 mg by mouth daily.   Yes Historical Provider, MD  conjugated estrogens (PREMARIN) vaginal cream Place 1 Applicatorful vaginally 3 (three) times a week. Apply a pea sized amount to vaginal opening 3 times a week   Yes Historical Provider, MD  cyanocobalamin 500 MCG tablet Take 500 mcg by mouth daily.   Yes Historical Provider, MD  diphenoxylate-atropine (LOMOTIL) 2.5-0.025 MG per tablet Take 1 tablet by mouth 4 (four) times daily as needed for diarrhea or loose stools. 07/04/12  Yes Rowe Clack, MD  fentaNYL (DURAGESIC) 100 MCG/HR Place 1 patch (100 mcg total) onto the skin every 3 (three) days. 09/05/13  Yes Rowe Clack, MD  folic acid (FOLVITE) 412 MCG tablet Take 400 mcg by mouth every morning.   Yes Historical Provider, MD  furosemide (LASIX) 20 MG tablet Take 20 mg by mouth every morning.   Yes Historical Provider, MD  gabapentin (NEURONTIN) 400 MG capsule Take 400 mg by mouth at bedtime.   Yes Historical Provider, MD  HYDROcodone-acetaminophen (NORCO/VICODIN) 5-325 MG per tablet Take 1 tablet by mouth every 6 (six) hours as needed. 12/24/12  Yes Robbie Lis, MD  isosorbide mononitrate (IMDUR) 30 MG 24 hr tablet Take 15 mg by mouth daily.   Yes Historical Provider, MD  levothyroxine (SYNTHROID, LEVOTHROID) 25 MCG tablet Take 25 mcg by mouth daily before breakfast.   Yes Historical Provider, MD  metoprolol succinate (TOPROL-XL) 50 MG 24 hr tablet Take 1 tablet (50 mg total) by mouth 2 (two) times daily. Take with or immediately following a meal. 03/26/13  Yes Rowe Clack, MD  Multiple Vitamin (MULTIVITAMIN) LIQD Take 5 mLs by mouth daily.   Yes Historical Provider, MD  nitroGLYCERIN  (NITROSTAT) 0.4 MG SL tablet Place 0.4 mg under the tongue every 5 (five) minutes x 3 doses as needed for chest pain. 03/20/12  Yes Roger A Arguello, PA-C  ondansetron (ZOFRAN) 4 MG tablet Take 1 tablet (4 mg total) by mouth every 12 (twelve) hours as needed for nausea. For nausea 02/25/13  Yes Rowe Clack, MD  pantoprazole (PROTONIX) 40 MG tablet Take 40 mg by mouth daily.   Yes Historical Provider, MD  potassium chloride (K-DUR) 10 MEQ tablet Take 2 tablets (20 mEq total) by mouth daily. 12/03/12  Yes Rowe Clack, MD  predniSONE (DELTASONE) 5 MG tablet Take 5 mg by mouth daily with breakfast.   Yes Historical Provider, MD  sucralfate (CARAFATE) 1 GM/10ML suspension Take 1 g by  mouth 2 (two) times daily.   Yes Historical Provider, MD   BP 158/70  Pulse 71  Temp(Src) 98.8 F (37.1 C) (Oral)  Resp 16  Ht 5\' 4"  (1.626 m)  Wt 137 lb (62.143 kg)  BMI 23.50 kg/m2  SpO2 99% Physical Exam  Nursing note and vitals reviewed. Constitutional: She is oriented to person, place, and time. She appears well-developed and well-nourished. No distress.  HENT:  Head: Normocephalic and atraumatic.  Mouth/Throat: Oropharynx is clear and moist.  Eyes: Conjunctivae are normal. Pupils are equal, round, and reactive to light. No scleral icterus.  Neck: Neck supple.  Cardiovascular: Normal rate, regular rhythm, normal heart sounds and intact distal pulses.   No murmur heard. Pulmonary/Chest: Effort normal and breath sounds normal. No stridor. No respiratory distress. She has no rales.  Abdominal: Soft. Bowel sounds are normal. She exhibits no distension. There is no tenderness.  Musculoskeletal: Normal range of motion.  Neurological: She is alert and oriented to person, place, and time. She has normal strength. GCS eye subscore is 4. GCS verbal subscore is 5. GCS motor subscore is 6.  Skin: Skin is warm and dry. No rash noted.  Psychiatric: She has a normal mood and affect. Her behavior is normal.     ED Course  Procedures (including critical care time) Labs Review Labs Reviewed  CBC WITH DIFFERENTIAL - Abnormal; Notable for the following:    RBC 2.99 (*)    Hemoglobin 10.5 (*)    HCT 32.2 (*)    MCV 107.7 (*)    MCH 35.1 (*)    RDW 16.7 (*)    Platelets 129 (*)    Monocytes Relative 13 (*)    Monocytes Absolute 1.1 (*)    All other components within normal limits  COMPREHENSIVE METABOLIC PANEL - Abnormal; Notable for the following:    Albumin 3.3 (*)    GFR calc non Af Amer 51 (*)    GFR calc Af Amer 59 (*)    All other components within normal limits  URINALYSIS, ROUTINE W REFLEX MICROSCOPIC - Abnormal; Notable for the following:    APPearance HAZY (*)    Leukocytes, UA SMALL (*)    All other components within normal limits  URINE MICROSCOPIC-ADD ON - Abnormal; Notable for the following:    Squamous Epithelial / LPF MANY (*)    All other components within normal limits  TROPONIN I    Imaging Review Ct Head Wo Contrast  11/02/2013   CLINICAL DATA:  Dizziness beginning this morning.  EXAM: CT HEAD WITHOUT CONTRAST  TECHNIQUE: Contiguous axial images were obtained from the base of the skull through the vertex without intravenous contrast.  COMPARISON:  None.  FINDINGS: Moderate generalized atrophy and white matter disease is present bilaterally. Lacunar infarcts in the basal ganglia bilaterally are likely remote. No significant cortical infarct is evident. No hemorrhage or mass lesion is present. The ventricles are proportionate to the degree of atrophy. No significant extra-axial fluid collection is present.  The paranasal sinuses and mastoid air cells are clear. The osseous skull is intact. Atherosclerotic calcifications are present within the cavernous carotid arteries bilaterally.  IMPRESSION: 1. Moderate atrophy and diffuse white matter disease likely reflects the sequela of chronic microvascular ischemia. 2. Lacunar infarcts of the basal ganglia bilaterally are likely  remote. 3. No significant posterior fossa infarct. No acute or focal lesion to explain the patient's symptoms.   Electronically Signed   By: Lawrence Santiago M.D.   On: 11/02/2013  11:35  All radiology studies independently viewed by me.      EKG Interpretation   Date/Time:  Saturday November 02 2013 10:01:13 EDT Ventricular Rate:  72 PR Interval:    QRS Duration: 83 QT Interval:  425 QTC Calculation: 465 R Axis:   130 Text Interpretation:  Atrial fibrillation with intermittent ventricular  pacing Right axis deviation Low voltage, precordial leads Probable  anteroseptal infarct, old comopared to prior, now pacing is intermittent  Confirmed by [facility]  MD, TREY ([id number]) on 11/02/2013 11:00:53 AM      MDM   Final diagnoses:  Near syncope    80 year old female who presenting with positional dizziness and near-syncope. The patient denies true syncope. She also denies vertigo. One episode of vomiting and one episode of diarrhea associated with this episode. Lab work unremarkable. CT head unremarkable. Her symptoms improved in the ED after IV fluids and food.  She tolerated by mouth intake well. We discussed admission for near-syncope versus outpatient management. Family preferred outpatient management and will followup closely with her PCP. Her symptoms were positional in nature and unlikely to represent cardiogenic near-syncope. She appears stable for outpatient treatment.    Houston Siren III, MD 11/02/13 [phone]

## 2013-11-02 NOTE — Telephone Encounter (Signed)
Mr. Reimann called because his mother's blood pressure was elevated and she was symptomatic from this. She has not yet had her morning medications. Her last blood pressure is 170/100.  I advised him that she had hypertensive urgency. Advised that it was okay to give her her morning medications but because her blood pressure was so high, she was having symptoms from this, and she is on Eliquis, I felt that she should be evaluated. He requested he called 911 and have her brought to the emergency room. Mr. Brame indicated understanding.

## 2013-11-02 NOTE — ED Notes (Signed)
Pt c/o high blood pressure, vomiting and diarrhea onset today. Family reports BP 180/100. Pt of [place], family spoke with pt Dr and was advised to call 63, but Family decided to bring pt in.

## 2013-11-04 ENCOUNTER — Telehealth: Payer: Self-pay | Admitting: Internal Medicine

## 2013-11-04 NOTE — Telephone Encounter (Signed)
New message    Daughter in law calling    Nicole Roberts to emergency on Saturday am  Blood pressure 170/100 - down to normal .   C/O still nausea / dizzy. Would like for patient to be seen today.

## 2013-11-04 NOTE — Telephone Encounter (Signed)
Spoke with pt's daughter in law, she is aware to call her PCP for BP issus and for pt's nausea and dizziness. She is aware that pt needs a F/U visit with Dr. Rayann Heman in November a 12 months F/U.daughter in Law verbalized understanding. She is calling now the PCP's office.

## 2013-11-04 NOTE — Telephone Encounter (Signed)
Pt's son Arnell Sieving is aware of Dr. Jackalyn Lombard recommendations. Son Verbalized understanding.

## 2013-11-04 NOTE — Telephone Encounter (Signed)
Pt has an appointment with Cecille Rubin gerhardt NP this coming Friday for fluctuated BP. Pt's son would like for pt to have an early appointment. Son  is aware that there are no openings on MD or PA/NP scheduled this week. Pt's son would like to see if Dr. Rayann Heman  allowed to [place] his scheduled on Wednesday 8 th and Thursday 9 th or if there are cancellations, to call him.

## 2013-11-04 NOTE — Telephone Encounter (Signed)
With any one of my partners is ok with me- or if i have an opening/cancellation, but not as "work in"/extra because of my meeting schedule I am sorry if i don't have space on my schedule, but I know my partners can manage this issue as needed thanks

## 2013-11-04 NOTE — Telephone Encounter (Signed)
Appt with Dr. [name] July 8.

## 2013-11-04 NOTE — Telephone Encounter (Signed)
It appears that the patient has also called Dr Katheren Puller office and is being worked in for an earlier visit with one of her partners. Per ED visit, she has been having GI symptoms as well and postural dizziness was not felt by ED staff to be cardiogenic. I would therefore recommend close outpatient PCP follow-up as recommended by ED and follow-up with Cecille Rubin in our office 11/08/13 as scheduled.

## 2013-11-04 NOTE — Telephone Encounter (Signed)
LMOM to return call.

## 2013-11-04 NOTE — Telephone Encounter (Signed)
Pt went to the ER for BP problems.  They observed her and sent her home.  No change in meds.  Her BP is still going up and down.  She is still dizzy and nauseous.  She was advised to see her PCP right away.  Can she be worked in?

## 2013-11-04 NOTE — Telephone Encounter (Signed)
New problem     Pt's son Arnell Sieving called does WANT a sooner appt than 7/10 with Dr Rayann Heman or PA.   Pt's son thinks she is at risk of falling and her Blood Pressure is unstable.  Pt went to the hosp this weekend.

## 2013-11-06 ENCOUNTER — Encounter: Payer: Self-pay | Admitting: Internal Medicine

## 2013-11-06 ENCOUNTER — Ambulatory Visit (INDEPENDENT_AMBULATORY_CARE_PROVIDER_SITE_OTHER): Payer: Medicare Other | Admitting: Internal Medicine

## 2013-11-06 VITALS — BP 150/80 | HR 79 | Temp 99.4°F | Wt 135.0 lb

## 2013-11-06 DIAGNOSIS — R509 Fever, unspecified: Secondary | ICD-10-CM

## 2013-11-06 DIAGNOSIS — R42 Dizziness and giddiness: Secondary | ICD-10-CM

## 2013-11-06 DIAGNOSIS — I10 Essential (primary) hypertension: Secondary | ICD-10-CM

## 2013-11-06 NOTE — Assessment & Plan Note (Signed)
Low grade, ? Related to recent short lived diarrheal illness , evovlving UTI or other, exam benign, declines repeat UA,  to f/u any worsening symptoms or concerns

## 2013-11-06 NOTE — Progress Notes (Signed)
Subjective:    Patient ID: Nicole Roberts, female    DOB: 1922-03-30, 80 y.o.   MRN: [id number]  HPI  Here to f/u, pt of Dr Asa Lente, with family; pt herself cont's wheelchair bound essentially due to inability to stand/gait disorder/balance issue/peripheral neuropathy previously eval per neurology;  Also incidentally with severe right shoulder DJD/chronic pain so has to scoot with her legs to move the wheelchair;  Here after an episode of dizziness on first sitting up from lying down yesterday am the family describes as "loss of balance and falling over", but on close questioning of pt herself described as vertigo like sensation, that led to falling over in bed on first getting up, and assoc with nausea.  Has had further short lived episodes lasting seconds since then while sitting but assoc with turning and position changes. Pt denies new neurological symptoms such as new headache, or facial or extremity weakness or numbness  Denies lightheadedness and syncope.  Sensation is different form her ongoing peripheral neuropathy and gait/balance issue.  No recent falls.  Seen in ER where symptoms thought related to acute GI illness and diarrheal illness, now resolved.  Pt has low grade temp today of which she unaware. Has hx of recurrent UTI but UA neg in ER 4 days ago, and Denies urinary symptoms such as dysuria, frequency, urgency, flank pain, hematuria or n/v, fever, chills.  Family very concerned regarding BP check several times since ER visit 7/4 with systolic ranging 967 - 591 on occasion maybe somehow related to the dizziness and nausea, as they recall in recent years BP more often systolic 638'G.   Note phone note per Dr Rayann Heman with thinking symptoms not likely cardiogenic given the GI symptoms.  Family not convinced, very concerned and note card f/u planned July 10. Pt denies chest pain, increased sob or doe, wheezing, orthopnea, PND, increased LE swelling, palpitations. Past Medical History  Diagnosis  Date  . HEARING LOSS   . MITRAL VALVE INSUFF&AORTIC VALVE INSUFF     moderate MR  . PULMONARY HYPERTENSION   . Atrial fibrillation     permanent  . SICK SINUS SYNDROME     a. Tachybrady syndrome - Guidant PPM 10/2005.  . Edema 03/19/2009    due to venous insufficiency, chronic lymphedema  . URINARY INCONTINENCE   . Rheumatoid arthritis(714.0) dx clarified 2011    On prednisone  . BREAST CANCER 12/2006    ductal ca s/p L lumpectomy  . Diastolic dysfunction   . HYPERTENSION   . HYPERLIPIDEMIA   . GERD   . HYPOTHYROIDISM   . Hyperparathyroidism     2 lobes removed  . Venous insufficiency     chronic BLE edema  . Spinal stenosis   . Anemia     a. Macrocytic - normal B12, folate 08/2011. b. 3/6 heme positive stools 10/2011 - per PCP note, elected against colonscopy.  [name] TIA (transient ischemic attack) 05/2011  . Anxiety    Past Surgical History  Procedure Laterality Date  . Pacemaker placement  2005    SSS and syncope  . Cholecystectomy  04/06/99  . Abdominal hysterectomy  1970    Partial  . Left hip arthroscopy  1995  . Right hip arthroscopy  01/2001  . Carpal tunnel release  1998    bilateral  . Breast surgery  12/2006    Left breast lupectomy- Ductal CA  . Left parathyroidectomy  07/2008  . Esophagogastroduodenoscopy N/A 10/18/2012    Procedure: ESOPHAGOGASTRODUODENOSCOPY (EGD);  Surgeon: Ladene Artist, MD;  Location: [facility];  Service: Endoscopy;  Laterality: N/A;    reports that she has never smoked. She has never used smokeless tobacco. She reports that she does not drink alcohol or use illicit drugs. family history includes Coronary artery disease in her brother and sister; Hypertension in her mother; Lung cancer in her father; Ovarian cancer in her mother. Allergies  Allergen Reactions  . Sulfa Antibiotics Itching  . Tramadol Hcl Itching and Rash   Current Outpatient Prescriptions on File Prior to Visit  Medication Sig Dispense Refill  . apixaban (ELIQUIS) 2.5  MG TABS tablet Take 1 tablet (2.5 mg total) by mouth 2 (two) times daily.  60 tablet  11  . atorvastatin (LIPITOR) 10 MG tablet Take 10 mg by mouth at bedtime.      . Calcium-Phosphorus-Vitamin D (CALCIUM GUMMIES) [phone] MG-MG-UNIT CHEW Chew 1 each by mouth 2 (two) times daily. Gummies Calcium 500 mg Vitamin D 500 units Chew by mouth. Gummies Calcium 500 mg Vitamin D 500 units  60 tablet  11  . cholecalciferol (VITAMIN D) 1000 UNITS tablet Take 1,000 Units by mouth daily.      . citalopram (CELEXA) 20 MG tablet Take 20 mg by mouth daily.      [name] conjugated estrogens (PREMARIN) vaginal cream Place 1 Applicatorful vaginally 3 (three) times a week. Apply a pea sized amount to vaginal opening 3 times a week      . cyanocobalamin 500 MCG tablet Take 500 mcg by mouth daily.      . diphenoxylate-atropine (LOMOTIL) 2.5-0.025 MG per tablet Take 1 tablet by mouth 4 (four) times daily as needed for diarrhea or loose stools.      . fentaNYL (DURAGESIC) 100 MCG/HR Place 1 patch (100 mcg total) onto the skin every 3 (three) days.  10 patch  0  . folic acid (FOLVITE) 379 MCG tablet Take 400 mcg by mouth every morning.      . furosemide (LASIX) 20 MG tablet Take 20 mg by mouth every morning.      . gabapentin (NEURONTIN) 400 MG capsule Take 400 mg by mouth at bedtime.      [name] HYDROcodone-acetaminophen (NORCO/VICODIN) 5-325 MG per tablet Take 1 tablet by mouth every 6 (six) hours as needed.  30 tablet  0  . isosorbide mononitrate (IMDUR) 30 MG 24 hr tablet Take 15 mg by mouth daily.      [name] levothyroxine (SYNTHROID, LEVOTHROID) 25 MCG tablet Take 25 mcg by mouth daily before breakfast.      . metoprolol succinate (TOPROL-XL) 50 MG 24 hr tablet Take 1 tablet (50 mg total) by mouth 2 (two) times daily. Take with or immediately following a meal.  60 tablet  6  . Multiple Vitamin (MULTIVITAMIN) LIQD Take 5 mLs by mouth daily.      . nitroGLYCERIN (NITROSTAT) 0.4 MG SL tablet Place 0.4 mg under the tongue every 5  (five) minutes x 3 doses as needed for chest pain.      [name] ondansetron (ZOFRAN) 4 MG tablet Take 1 tablet (4 mg total) by mouth every 12 (twelve) hours as needed for nausea. For nausea  30 tablet  1  . pantoprazole (PROTONIX) 40 MG tablet Take 40 mg by mouth daily.      . potassium chloride (K-DUR) 10 MEQ tablet Take 2 tablets (20 mEq total) by mouth daily.  60 tablet  11  . predniSONE (DELTASONE) 5 MG tablet Take 5 mg by mouth  daily with breakfast.      . sucralfate (CARAFATE) 1 GM/10ML suspension Take 1 g by mouth 2 (two) times daily.       No current facility-administered medications on file prior to visit.   Review of Systems  Constitutional: Negative for unusual diaphoresis or other sweats  HENT: Negative for ringing in ear Eyes: Negative for double vision or worsening visual disturbance.  Respiratory: Negative for choking and stridor.   Gastrointestinal: Negative for vomiting or other signifcant bowel change Genitourinary: Negative for hematuria or decreased urine volume.  Musculoskeletal: Negative for other MSK pain or swelling Skin: Negative for color change and worsening wound.  Neurological: Negative for tremors and numbness other than noted  Psychiatric/Behavioral: Negative for decreased concentration or agitation other than above       Objective:   Physical Exam BP 150/80  Pulse 79  Temp(Src) 99.4 F (37.4 C) (Oral)  Wt 135 lb (61.236 kg)  SpO2 96% VS noted, frail appearing Constitutional: Pt appears well-developed, well-nourished.  HENT: Head: NCAT.  Right Ear: External ear normal.  Left Ear: External ear normal.  Bilat canals clear, TM's clear bilat Eyes: . Pupils are equal, round, and reactive to light. Conjunctivae and EOM are normal Neck: Normal range of motion. Neck supple.  Cardiovascular: Normal rate and irregular rhythm.   Pulmonary/Chest: Effort normal and breath sounds without rales or wheezing.  Abd:  Soft, NT, ND, + BS, no flank tender Neurological: Pt  is alert. Not confused , motor grossly intact, FTN intact, cn 2-12 intact Skin: Skin is warm. No rash, chronic 1+ ankle edema noted Psychiatric: Pt behavior is normal. No agitation. mild nervous    Assessment & Plan:

## 2013-11-06 NOTE — Assessment & Plan Note (Addendum)
I suspect peripheral vertigo, possibly BPV, pt declines meclizine trial as individual episodes so short lived, exam ow benign, recent ct head neg for acute,  to f/u any worsening symptoms or concerns, consider neuro eval or even eval of post circulation/MRI/MRA  Note:  Total time for pt hx, exam, review of record with pt in the room, determination of diagnoses and plan for further eval and tx is > 40 min, with over 50% spent in coordination and counseling of patient

## 2013-11-06 NOTE — Progress Notes (Signed)
Pre visit review using our clinic review tool, if applicable. No additional management support is needed unless otherwise documented below in the visit note. 

## 2013-11-06 NOTE — Assessment & Plan Note (Signed)
Suspect likely reactive, I hesitate to incr her beta blocker with hx of bradycardia/PPM, has fu with card July 10, family not happy I am not increasing her med but will await further eval.

## 2013-11-06 NOTE — Patient Instructions (Signed)
Please continue all other medications as before, and refills have been done if requested.  Please have the pharmacy call with any other refills you may need.  Please keep your appointments with your specialists as you may have planned  Please call if you change your mind about the meclizine for the dizziness  Please call if you have further urinary symptoms or fever, or other unusual symptoms  Please return in 3 weeks to Dr Asa Lente, or sooner if needed

## 2013-11-08 ENCOUNTER — Ambulatory Visit (INDEPENDENT_AMBULATORY_CARE_PROVIDER_SITE_OTHER): Payer: Medicare Other | Admitting: Nurse Practitioner

## 2013-11-08 ENCOUNTER — Encounter: Payer: Self-pay | Admitting: Nurse Practitioner

## 2013-11-08 VITALS — BP 130/70 | HR 82 | Ht 64.0 in

## 2013-11-08 DIAGNOSIS — I4891 Unspecified atrial fibrillation: Secondary | ICD-10-CM

## 2013-11-08 DIAGNOSIS — I482 Chronic atrial fibrillation, unspecified: Secondary | ICD-10-CM

## 2013-11-08 DIAGNOSIS — R42 Dizziness and giddiness: Secondary | ICD-10-CM

## 2013-11-08 DIAGNOSIS — R55 Syncope and collapse: Secondary | ICD-10-CM

## 2013-11-08 NOTE — Progress Notes (Signed)
[facility] Date of Birth: 1921/10/29 Medical Record #[id number]  History of Present Illness: Ms. Feliz is seen back today for a post hospital visit. Seen for Dr. Rayann Heman. She is an 80 year old female with multiple issues. These include AF, pulmonary HTN, SSS with PPM in place, breast cancer, diastolic dysfunction, HTN, HLD, GERD, venous insufficiency, anemia and moderate MR.  Last seen here in November. Dr. Rayann Heman started low dose Eliquis in place of her Pradaxa. PPM check was ok.  Most recently admitted with diarrhea and vomiting that was associated with near syncope/positional dizziness. ER evaluation was negative. Improved with IV fluids and food. Symptoms were positional in nature and not felt to be from a cardiac origin.   Comes in today. Here with her son. She has been lightheaded and having positional dizziness. Apparently BP was up to 180 and this is what led to her ER visit. BP is usually in the 120's. She is more sedentary. Does wear support stockings. No chest pain. Not really short of breath. No palpitations. Using automatic BP cuff. She is in atrial fib. Dr. [name] wanted to give her Meclizine but her spells are so short lived - she really does not want any more medicines. Son is concerned with BP being over 150.    Current Outpatient Prescriptions  Medication Sig Dispense Refill  . apixaban (ELIQUIS) 2.5 MG TABS tablet Take 1 tablet (2.5 mg total) by mouth 2 (two) times daily.  60 tablet  11  . atorvastatin (LIPITOR) 10 MG tablet Take 10 mg by mouth at bedtime.      . Calcium-Phosphorus-Vitamin D (CALCIUM GUMMIES) [phone] MG-MG-UNIT CHEW Chew 1 each by mouth 2 (two) times daily. Gummies Calcium 500 mg Vitamin D 500 units Chew by mouth. Gummies Calcium 500 mg Vitamin D 500 units  60 tablet  11  . cholecalciferol (VITAMIN D) 1000 UNITS tablet Take 1,000 Units by mouth daily.      . citalopram (CELEXA) 20 MG tablet Take 20 mg by mouth daily.      [name] conjugated estrogens  (PREMARIN) vaginal cream Place 1 Applicatorful vaginally 3 (three) times a week. Apply a pea sized amount to vaginal opening 3 times a week      . cyanocobalamin 500 MCG tablet Take 500 mcg by mouth daily.      . diphenoxylate-atropine (LOMOTIL) 2.5-0.025 MG per tablet Take 1 tablet by mouth 4 (four) times daily as needed for diarrhea or loose stools.      . fentaNYL (DURAGESIC - DOSED MCG/HR) 100 MCG/HR Place 75 mcg onto the skin every 3 (three) days.      . folic acid (FOLVITE) 761 MCG tablet Take 400 mcg by mouth every morning.      . furosemide (LASIX) 20 MG tablet Take 20 mg by mouth every morning.      . gabapentin (NEURONTIN) 400 MG capsule Take 400 mg by mouth at bedtime.      [name] HYDROcodone-acetaminophen (NORCO/VICODIN) 5-325 MG per tablet Take 1 tablet by mouth every 6 (six) hours as needed.  30 tablet  0  . isosorbide mononitrate (IMDUR) 30 MG 24 hr tablet Take 15 mg by mouth daily.      [name] levothyroxine (SYNTHROID, LEVOTHROID) 25 MCG tablet Take 25 mcg by mouth daily before breakfast.      . metoprolol succinate (TOPROL-XL) 50 MG 24 hr tablet Take 1 tablet (50 mg total) by mouth 2 (two) times daily. Take with or immediately following a meal.  60  tablet  6  . Multiple Vitamin (MULTIVITAMIN) LIQD Take 5 mLs by mouth daily.      . nitroGLYCERIN (NITROSTAT) 0.4 MG SL tablet Place 0.4 mg under the tongue every 5 (five) minutes x 3 doses as needed for chest pain.      [name] ondansetron (ZOFRAN) 4 MG tablet Take 1 tablet (4 mg total) by mouth every 12 (twelve) hours as needed for nausea. For nausea  30 tablet  1  . pantoprazole (PROTONIX) 40 MG tablet Take 40 mg by mouth daily.      . potassium chloride (K-DUR) 10 MEQ tablet Take 2 tablets (20 mEq total) by mouth daily.  60 tablet  11  . predniSONE (DELTASONE) 5 MG tablet Take 5 mg by mouth daily with breakfast.      . sucralfate (CARAFATE) 1 GM/10ML suspension Take 1 g by mouth 2 (two) times daily.       No current facility-administered  medications for this visit.    Allergies  Allergen Reactions  . Sulfa Antibiotics Itching  . Tramadol Hcl Itching and Rash    Past Medical History  Diagnosis Date  . HEARING LOSS   . MITRAL VALVE INSUFF&AORTIC VALVE INSUFF     moderate MR  . PULMONARY HYPERTENSION   . Atrial fibrillation     permanent  . SICK SINUS SYNDROME     a. Tachybrady syndrome - Guidant PPM 10/2005.  . Edema 03/19/2009    due to venous insufficiency, chronic lymphedema  . URINARY INCONTINENCE   . Rheumatoid arthritis(714.0) dx clarified 2011    On prednisone  . BREAST CANCER 12/2006    ductal ca s/p L lumpectomy  . Diastolic dysfunction   . HYPERTENSION   . HYPERLIPIDEMIA   . GERD   . HYPOTHYROIDISM   . Hyperparathyroidism     2 lobes removed  . Venous insufficiency     chronic BLE edema  . Spinal stenosis   . Anemia     a. Macrocytic - normal B12, folate 08/2011. b. 3/6 heme positive stools 10/2011 - per PCP note, elected against colonscopy.  [name] TIA (transient ischemic attack) 05/2011  . Anxiety     Past Surgical History  Procedure Laterality Date  . Pacemaker placement  2005    SSS and syncope  . Cholecystectomy  04/06/99  . Abdominal hysterectomy  1970    Partial  . Left hip arthroscopy  1995  . Right hip arthroscopy  01/2001  . Carpal tunnel release  1998    bilateral  . Breast surgery  12/2006    Left breast lupectomy- Ductal CA  . Left parathyroidectomy  07/2008  . Esophagogastroduodenoscopy N/A 10/18/2012    Procedure: ESOPHAGOGASTRODUODENOSCOPY (EGD);  Surgeon: Ladene Artist, MD;  Location: [facility];  Service: Endoscopy;  Laterality: N/A;    History  Smoking status  . Never Smoker   Smokeless tobacco  . Never Used    Comment: Lives at [facility] since 03/2009- married, lives with spouse. Moved here from [facility] to be near son    History  Alcohol Use No    Family History  Problem Relation Age of Onset  . Ovarian cancer Mother   . Coronary artery  disease Sister   . Coronary artery disease Brother   . Hypertension Mother     grandparent  . Lung cancer Father     Review of Systems: The review of systems is per the HPI.  All other systems were reviewed and are negative.  Physical Exam: BP 130/70  Pulse 82  Ht 5\' 4"  (1.626 m)  SpO2 98% BP by me is 124/70.  Patient is very pleasant and in no acute distress. She is in a wheelchair. Skin is warm and dry. Color is normal.  HEENT is unremarkable. Normocephalic/atraumatic. PERRL. Sclera are nonicteric. Neck is supple. No masses. No JVD. Lungs are clear. Cardiac exam shows an irregular rhythm. Rate is controlled. Abdomen is soft. Extremities are without edema. Gait and ROM are intact. No gross neurologic deficits noted.  Wt Readings from Last 3 Encounters:  11/06/13 135 lb (61.236 kg)  11/02/13 137 lb (62.143 kg)  03/08/13 154 lb (69.854 kg)    LABORATORY DATA/PROCEDURES:  Lab Results  Component Value Date   WBC 8.7 11/02/2013   HGB 10.5* 11/02/2013   HCT 32.2* 11/02/2013   PLT 129* 11/02/2013   GLUCOSE 91 11/02/2013   CHOL 143 03/20/2012   TRIG 77 03/20/2012   HDL 70 03/20/2012   LDLDIRECT 131.4 12/28/2009   LDLCALC 58 03/20/2012   ALT 28 11/02/2013   AST 31 11/02/2013   NA 142 11/02/2013   K 4.4 11/02/2013   CL 102 11/02/2013   CREATININE 0.96 11/02/2013   BUN 20 11/02/2013   CO2 27 11/02/2013   TSH 2.10 02/25/2013   INR 1.27 03/19/2012    BNP (last 3 results)  Recent Labs  12/13/12 0600  PROBNP 7685.0*     Assessment / Plan: 1. Presyncope - sounds like positional - she is more sedentary. Needs to work her feet before getting up. Continue with support stockings  2. HTN - her BP is fine - I have explained that even a 641 systolic BP is ok with her age. Would only change her medicines if consistently over 583 sytolic.  3. AF - rate controlled  4. Chronic anticoagulation - on Eliquis.  See back as planned.   Patient is agreeable to this plan and will call if any problems  develop in the interim.   Burtis Junes, RN, [facility] [address] [place], [place]  [address] [phone]

## 2013-11-08 NOTE — Patient Instructions (Addendum)
Stay on your current medicines  "Work your feet" before you get up - this will help with your dizziness  See Dr. Rayann Heman back as planned  Call the [place] office at [phone] if you have any questions, problems or concerns.

## 2013-11-20 ENCOUNTER — Other Ambulatory Visit: Payer: Self-pay | Admitting: Internal Medicine

## 2013-11-20 DIAGNOSIS — I509 Heart failure, unspecified: Secondary | ICD-10-CM

## 2013-11-20 DIAGNOSIS — IMO0001 Reserved for inherently not codable concepts without codable children: Secondary | ICD-10-CM

## 2013-11-20 DIAGNOSIS — I10 Essential (primary) hypertension: Secondary | ICD-10-CM

## 2013-11-20 DIAGNOSIS — M75120 Complete rotator cuff tear or rupture of unspecified shoulder, not specified as traumatic: Secondary | ICD-10-CM

## 2013-11-20 DIAGNOSIS — R269 Unspecified abnormalities of gait and mobility: Secondary | ICD-10-CM

## 2013-11-21 ENCOUNTER — Other Ambulatory Visit: Payer: Self-pay | Admitting: Internal Medicine

## 2013-12-11 ENCOUNTER — Ambulatory Visit (INDEPENDENT_AMBULATORY_CARE_PROVIDER_SITE_OTHER): Payer: Medicare Other | Admitting: Internal Medicine

## 2013-12-11 ENCOUNTER — Encounter: Payer: Self-pay | Admitting: Internal Medicine

## 2013-12-11 VITALS — BP 131/77 | HR 81 | Temp 98.1°F | Ht 64.0 in | Wt 143.0 lb

## 2013-12-11 DIAGNOSIS — I4891 Unspecified atrial fibrillation: Secondary | ICD-10-CM

## 2013-12-11 DIAGNOSIS — M069 Rheumatoid arthritis, unspecified: Secondary | ICD-10-CM

## 2013-12-11 DIAGNOSIS — G909 Disorder of the autonomic nervous system, unspecified: Secondary | ICD-10-CM

## 2013-12-11 DIAGNOSIS — D492 Neoplasm of unspecified behavior of bone, soft tissue, and skin: Secondary | ICD-10-CM

## 2013-12-11 DIAGNOSIS — E039 Hypothyroidism, unspecified: Secondary | ICD-10-CM

## 2013-12-11 DIAGNOSIS — I480 Paroxysmal atrial fibrillation: Secondary | ICD-10-CM

## 2013-12-11 MED ORDER — GABAPENTIN 100 MG PO CAPS: ORAL_CAPSULE | ORAL | Status: DC

## 2013-12-11 NOTE — Assessment & Plan Note (Signed)
patient following with rheumatology regarding same Currently pain symptoms stable on prednisone, immunotherapy and fentanyl patch -no changes recommended by me today

## 2013-12-11 NOTE — Assessment & Plan Note (Signed)
Near syncope symptoms July 2015 prompting emergency room evaluation reviewed, no cardiogenic event Chronic positional dizziness with minimal movement/trigger Suspect autonomic neuropathy  Continue compression hose, attention to hydration status and education on same provided -ongoing physical therapy reviewed for same and associated gait disorder Patient wishes to discontinue gabapentin as uncertain if beneficial for neuropathy symptoms, will decrease dose as tolerated by symptoms

## 2013-12-11 NOTE — Progress Notes (Signed)
Pre visit review using our clinic review tool, if applicable. No additional management support is needed unless otherwise documented below in the visit note. 

## 2013-12-11 NOTE — Patient Instructions (Signed)
It was good to see you today.  We have reviewed your prior records including labs and tests today  Medications reviewed and updated Reduce Neurontin as discussed: Take 200 mg at bedtime for 30 days, then 100 mg at bedtime for 30 days then stop No other medication changes recommended at this time.  If you have recurrent leg pain, foot pain or other problems with reducing gabapentin, please call and notify us for additional medication changes we'll make referral to dermatology . Our office will contact you regarding appointment(s) once made.  Please schedule followup in 4-6 months, call sooner if problems.

## 2013-12-11 NOTE — Progress Notes (Signed)
Subjective:    Patient ID: Nicole Roberts, female    DOB: [birth year], 80 y.o.   MRN: [id number]  HPI  Patient is here for follow up  Reviewed chronic medical issues and interval medical events  Past Medical History  Diagnosis Date  . HEARING LOSS   . MITRAL VALVE INSUFF&AORTIC VALVE INSUFF     moderate MR  . PULMONARY HYPERTENSION   . Atrial fibrillation     permanent  . SICK SINUS SYNDROME     a. Tachybrady syndrome - Guidant PPM 10/2005.  . Edema 03/19/2009    due to venous insufficiency, chronic lymphedema  . URINARY INCONTINENCE   . Rheumatoid arthritis(714.0) dx clarified 2011    On prednisone  . BREAST CANCER 12/2006    ductal ca s/p L lumpectomy  . Diastolic dysfunction   . HYPERTENSION   . HYPERLIPIDEMIA   . GERD   . HYPOTHYROIDISM   . Hyperparathyroidism     2 lobes removed  . Venous insufficiency     chronic BLE edema  . Spinal stenosis   . Anemia     a. Macrocytic - normal B12, folate 08/2011. b. 3/6 heme positive stools 10/2011 - per PCP note, elected against colonscopy.  [name] TIA (transient ischemic attack) 05/2011  . Anxiety     Review of Systems  Constitutional: Negative for fever and fatigue.  Respiratory: Negative for cough and shortness of breath.   Cardiovascular: Positive for leg swelling (chronic). Negative for chest pain.  Musculoskeletal: Positive for back pain and gait problem.  Skin: Positive for wound (anterior).  Neurological: Positive for dizziness (intermittent, positional). Negative for tremors, syncope and speech difficulty.       Objective:   Physical Exam  BP 131/77  Pulse 81  Temp(Src) 98.1 F (36.7 C) (Oral)  Ht 5\' 4"  (1.626 m)  Wt 143 lb (64.864 kg)  BMI 24.53 kg/m2  SpO2 94% Wt Readings from Last 3 Encounters:  12/11/13 143 lb (64.864 kg)  11/06/13 135 lb (61.236 kg)  11/02/13 137 lb (62.143 kg)   Constitutional: She appears well-developed and well-nourished. No distress. Dtr in law at side Neck: Normal range of  motion. Neck supple. No JVD present. No thyromegaly present.  Cardiovascular: Normal rate, regular rhythm and normal heart sounds.  3/6 syst murmur heard. 1+ BLE edema, chronic. Wears compression sticking Pulmonary/Chest: Effort normal and breath sounds normal. No respiratory distress. She has no wheezes.  Skin: anterior chest with erythematous nodule 46mm diameter, mild inflammation at site of cryotherapy 09/2013 - no fluctuence Psychiatric: She has a normal mood and affect. Her behavior is normal. Judgment and thought content normal.   Lab Results  Component Value Date   WBC 8.7 11/02/2013   HGB 10.5* 11/02/2013   HCT 32.2* 11/02/2013   PLT 129* 11/02/2013   GLUCOSE 91 11/02/2013   CHOL 143 03/20/2012   TRIG 77 03/20/2012   HDL 70 03/20/2012   LDLDIRECT 131.4 12/28/2009   LDLCALC 58 03/20/2012   ALT 28 11/02/2013   AST 31 11/02/2013   NA 142 11/02/2013   K 4.4 11/02/2013   CL 102 11/02/2013   CREATININE 0.96 11/02/2013   BUN 20 11/02/2013   CO2 27 11/02/2013   TSH 2.10 02/25/2013   INR 1.27 03/19/2012    Ct Head Wo Contrast  11/02/2013   CLINICAL DATA:  Dizziness beginning this morning.  EXAM: CT HEAD WITHOUT CONTRAST  TECHNIQUE: Contiguous axial images were obtained from the base of the skull through the  vertex without intravenous contrast.  COMPARISON:  None.  FINDINGS: Moderate generalized atrophy and white matter disease is present bilaterally. Lacunar infarcts in the basal ganglia bilaterally are likely remote. No significant cortical infarct is evident. No hemorrhage or mass lesion is present. The ventricles are proportionate to the degree of atrophy. No significant extra-axial fluid collection is present.  The paranasal sinuses and mastoid air cells are clear. The osseous skull is intact. Atherosclerotic calcifications are present within the cavernous carotid arteries bilaterally.  IMPRESSION: 1. Moderate atrophy and diffuse white matter disease likely reflects the sequela of chronic microvascular  ischemia. 2. Lacunar infarcts of the basal ganglia bilaterally are likely remote. 3. No significant posterior fossa infarct. No acute or focal lesion to explain the patient's symptoms.   Electronically Signed   By: Lawrence Santiago M.D.   On: 11/02/2013 11:35       Assessment & Plan:   Persistent skin lesion anterior chest at site of prior cryotherapy. Refer to dermatology for shave biopsy or other definitive treatment as needed  Problem List Items Addressed This Visit   Atrial fibrillation     On anticoag pradaxa until hospitalization summer 2014, temporarily stopped because of complications, then Pradaxa changed to Eliquis by cards 03/2013 Rate controlled on current beta blocker, continue extended release at current dosing (dose reduced from 100 bid to 50 bid in 09/2012 at home to avoid hypotension and fatigue) Follows with cards for same - last office visit reviewed No changes recommended today    Autonomic neuropathy     Near syncope symptoms July 2015 prompting emergency room evaluation reviewed, no cardiogenic event Chronic positional dizziness with minimal movement/trigger Suspect autonomic neuropathy  Continue compression hose, attention to hydration status and education on same provided -ongoing physical therapy reviewed for same and associated gait disorder Patient wishes to discontinue gabapentin as uncertain if beneficial for neuropathy symptoms, will decrease dose as tolerated by symptoms    Relevant Medications      gabapentin (NEURONTIN) capsule   HYPOTHYROIDISM      On low dose synthroid recheck TSH annually, adjust as needed  Lab Results  Component Value Date   TSH 2.10 02/25/2013      Rheumatoid arthritis(714.0)     patient following with rheumatology regarding same Currently pain symptoms stable on prednisone, immunotherapy and fentanyl patch -no changes recommended by me today     Other Visit Diagnoses   Abnormal skin growth    -  Primary    Relevant Orders         Ambulatory referral to Dermatology

## 2013-12-11 NOTE — Assessment & Plan Note (Signed)
On anticoag pradaxa until hospitalization summer 2014, temporarily stopped because of complications, then Pradaxa changed to Eliquis by cards 03/2013 Rate controlled on current beta blocker, continue extended release at current dosing (dose reduced from 100 bid to 50 bid in 09/2012 at home to avoid hypotension and fatigue) Follows with cards for same - last office visit reviewed No changes recommended today

## 2013-12-11 NOTE — Assessment & Plan Note (Signed)
On low dose synthroid recheck TSH annually, adjust as needed  Lab Results  Component Value Date   TSH 2.10 02/25/2013   

## 2013-12-20 ENCOUNTER — Other Ambulatory Visit: Payer: Self-pay | Admitting: Internal Medicine

## 2014-01-01 ENCOUNTER — Encounter: Payer: Self-pay | Admitting: Internal Medicine

## 2014-01-13 ENCOUNTER — Telehealth: Payer: Self-pay | Admitting: Internal Medicine

## 2014-01-13 MED ORDER — GABAPENTIN 100 MG PO CAPS
200.0000 mg | ORAL_CAPSULE | Freq: Every day | ORAL | Status: DC
Start: 2014-01-13 — End: 2014-01-13

## 2014-01-13 MED ORDER — GABAPENTIN 100 MG PO CAPS: 200.0000 mg | ORAL_CAPSULE | Freq: Every day | ORAL | Status: DC

## 2014-01-13 NOTE — Telephone Encounter (Signed)
Dose change and new rx done - ok to fax

## 2014-01-13 NOTE — Telephone Encounter (Signed)
Patient is requesting a new script for neurontin to be increased from 100mg  to 200mg  b.c of pain in feet.  Patient is at [facility] and is requesting script to be faxed there at [phone].

## 2014-01-14 ENCOUNTER — Telehealth: Payer: Self-pay | Admitting: *Deleted

## 2014-01-14 DIAGNOSIS — G909 Disorder of the autonomic nervous system, unspecified: Secondary | ICD-10-CM

## 2014-01-14 DIAGNOSIS — M069 Rheumatoid arthritis, unspecified: Secondary | ICD-10-CM

## 2014-01-14 NOTE — Telephone Encounter (Signed)
Left msg on triage requesting order for PT.../lmb

## 2014-01-14 NOTE — Telephone Encounter (Signed)
AMB PT ordered

## 2014-01-15 NOTE — Telephone Encounter (Signed)
Notified Lora with md response...Johny Chess

## 2014-01-16 ENCOUNTER — Ambulatory Visit: Payer: Medicare Other | Admitting: Podiatrist

## 2014-01-21 ENCOUNTER — Telehealth: Payer: Self-pay | Admitting: *Deleted

## 2014-01-21 NOTE — Telephone Encounter (Signed)
Verbal ok as requested

## 2014-01-21 NOTE — Telephone Encounter (Signed)
Left msg on triage requesting verbal order for OT.[name]/LMB

## 2014-01-22 NOTE — Telephone Encounter (Signed)
Notified [name] with md response...Nicole Roberts

## 2014-01-30 ENCOUNTER — Ambulatory Visit: Payer: Medicare Other | Admitting: Podiatrist

## 2014-02-04 ENCOUNTER — Telehealth: Payer: Self-pay | Admitting: *Deleted

## 2014-02-04 ENCOUNTER — Ambulatory Visit (INDEPENDENT_AMBULATORY_CARE_PROVIDER_SITE_OTHER): Payer: Medicare Other

## 2014-02-04 DIAGNOSIS — Z23 Encounter for immunization: Secondary | ICD-10-CM

## 2014-02-04 NOTE — Telephone Encounter (Signed)
[place] [phone] contacted to give verbal orders to continue PT for 4 more weeks, per PT recommendations. Message routed to Dr. Asa Lente

## 2014-02-06 ENCOUNTER — Ambulatory Visit (INDEPENDENT_AMBULATORY_CARE_PROVIDER_SITE_OTHER): Payer: Medicare Other | Admitting: Podiatrist

## 2014-02-06 DIAGNOSIS — M79676 Pain in unspecified toe(s): Secondary | ICD-10-CM

## 2014-02-06 DIAGNOSIS — B351 Tinea unguium: Secondary | ICD-10-CM

## 2014-02-06 NOTE — Progress Notes (Signed)
HPI:  Patient presents today for follow up of foot and nail care. Denies any new complaints today.  Objective:  Patients chart is reviewed.  Vascular status reveals pedal pulses noted at 2 out of 4 dp and pt bilateral .  Neurological sensation is intact to Semmes Weinstein monofilament bilateral.  Patients nails are thickened, discolored, distrophic, friable and brittle with yellow-brown discoloration. Patient subjectively relates they are painful with shoes and with ambulation of bilateral feet.  Assessment:  Symptomatic onychomycosis  Plan:  Discussed treatment options and alternatives.  The symptomatic toenails were debrided through manual an mechanical means without complication.  Return appointment recommended at routine intervals of 3 months    Tayton Decaire, DPM  

## 2014-02-17 ENCOUNTER — Other Ambulatory Visit: Payer: Self-pay

## 2014-02-17 MED ORDER — HYDROCODONE-ACETAMINOPHEN 5-325 MG PO TABS: 1.0000 | ORAL_TABLET | Freq: Four times a day (QID) | ORAL | Status: DC | PRN

## 2014-03-03 DIAGNOSIS — Z0279 Encounter for issue of other medical certificate: Secondary | ICD-10-CM

## 2014-03-26 ENCOUNTER — Encounter: Payer: Self-pay | Admitting: *Deleted

## 2014-04-14 ENCOUNTER — Telehealth: Payer: Self-pay | Admitting: *Deleted

## 2014-04-14 NOTE — Telephone Encounter (Signed)
Patient Name: Nicole Roberts Gender: Female DOB: 10/27/1921 Age: 80 Y 2 M 9 D Return Phone Number: [phone] (Primary) Address: [address] City/State/Zip: [place] [address] Client [facility] - Client Client Site [place] - Night Physician Gwendolyn Grant Contact Type Call Call Type Triage / [place] Name Arnell Sieving Relationship To Patient Mother Return Phone Number [phone] (Primary) Chief Complaint Prescription Refill or Medication Request (non symptomatic) Initial Comment Caller states mother is going through a lot of stress due to husband being sick and now son passing away requesting to increase medication. celexa Nurse Assessment Nurse: Myrle Sheng RN, Larene Beach Date/Time Eilene Ghazi Time): 04/11/2014 8:28:09 PM Confirm and document reason for call. If symptomatic, describe symptoms. ---Caller states mother is going through a lot of stress due to husband being sick and now son passing away requesting to increase medication. celexa. Has been on 40 mg before and wants to put her on it again. Has the patient traveled out of the country within the last 30 days? ---No Does the patient require triage? ---No Please document clinical information provided and list any resource used. ---I advised caller that I would have to page the on call doctor. Nurse: Myrle Sheng RN, Larene Beach Date/Time Eilene Ghazi Time): 04/11/2014 8:31:54 PM Please select the assessment type ---Verbal order / New medication order Does the client directives allow for assistance with medications after hours? ---Yes Other current medications? ---Yes Medication allergies? ---Yes List medication allergies. ---[facility] Pharmacy name and phone number. ---RX care [phone] Does the client directive allow for RN to call in the medication order to the pharmacy? ---Yes Guidelines Guideline Title Affirmed Question Affirmed Notes Nurse Date/Time Eilene Ghazi Time) PLEASE NOTE:  All timestamps contained within this report are represented as [place] Standard Time. CONFIDENTIALTY NOTICE: This fax transmission is intended only for the addressee. It contains information that is legally privileged, confidential or otherwise protected from use or disclosure. If you are not the intended recipient, you are strictly prohibited from reviewing, disclosing, copying using or disseminating any of this information or taking any action in reliance on or regarding this information. If you have received this fax in error, please notify us immediately by telephone so that we can arrange for its return to [place]. Phone: [phone], Toll-Free: [phone], Fax: [phone] Page: 2 of 2 Call Id: [id number] [place]. Time Eilene Ghazi Time) Disposition Final User 04/11/2014 8:37:05 PM Called On-Call Provider Myrle Sheng, RN, Larene Beach 04/11/2014 8:39:18 PM Clinical Call Yes Myrle Sheng, RN, Larene Beach After Care Instructions Given Call Event Type User Date / Time Description Comments User: Shelia Media, RN Date/Time Eilene Ghazi Time): 04/11/2014 8:39:08 PM Called the caller back and advised him of Dr. Hulen Shouts recommendations, he states he will call the office tomorrow. Paging DoctorName DoctorPhone DateTime Result/Outcome Notes Arnette Norris [phone] 04/11/2014 8:37:05 PM Called On Call Provider - Reached Dr. Deborra Medina states she is not comfortable increasing the medication to 40mg  and that they need to contact their PCP Monday. Arnette Norris 04/11/2014 8:38:39 PM Spoke with On Call - General

## 2014-04-23 ENCOUNTER — Ambulatory Visit (INDEPENDENT_AMBULATORY_CARE_PROVIDER_SITE_OTHER): Payer: Medicare Other | Admitting: *Deleted

## 2014-04-23 ENCOUNTER — Ambulatory Visit (INDEPENDENT_AMBULATORY_CARE_PROVIDER_SITE_OTHER): Payer: Medicare Other | Admitting: Internal Medicine

## 2014-04-23 ENCOUNTER — Encounter: Payer: Self-pay | Admitting: Internal Medicine

## 2014-04-23 VITALS — BP 110/78 | HR 82 | Ht 64.0 in

## 2014-04-23 DIAGNOSIS — I4891 Unspecified atrial fibrillation: Secondary | ICD-10-CM

## 2014-04-23 DIAGNOSIS — I10 Essential (primary) hypertension: Secondary | ICD-10-CM

## 2014-04-23 LAB — MDC_IDC_ENUM_SESS_TYPE_INCLINIC
Brady Statistic RA Percent Paced: 0 %
Date Time Interrogation Session: 20151223050000
Implantable Pulse Generator Model: 1290
Lead Channel Pacing Threshold Amplitude: 0.8 V
Lead Channel Pacing Threshold Pulse Width: 0.8 ms
Lead Channel Sensing Intrinsic Amplitude: 11.6 mV
MDC IDC MSMT LEADCHNL RV IMPEDANCE VALUE: 580 Ohm
MDC IDC PG SERIAL: 785093
MDC IDC SET LEADCHNL RV PACING AMPLITUDE: 2.4 V
MDC IDC SET LEADCHNL RV PACING PULSEWIDTH: 0.8 ms
MDC IDC SET LEADCHNL RV SENSING SENSITIVITY: 2.5 mV
MDC IDC STAT BRADY RV PERCENT PACED: 42 %

## 2014-04-23 NOTE — Patient Instructions (Signed)
Your physician wants you to follow-up in: 12 months with Dr Allred You will receive a reminder letter in the mail two months in advance. If you don't receive a letter, please call our office to schedule the follow-up appointment.  

## 2014-04-23 NOTE — Progress Notes (Signed)
Pt was started on Eliquis for Afib  on 03/09/2013.    Reviewed patients medication list.  Pt is not  currently on any combined P-gp and strong CYP3A4 inhibitors/inducers (ketoconazole, traconazole, ritonavir, carbamazepine, phenytoin, rifampin, St. John's wort).  Reviewed labs.  SCr 1.1, Weight 64Kg, Age 74yrs old.  Dose will  remain at 2.5mg s BID per Dr Hedy Camara, Pharm D due to her weight fluctuation.  Will re-evaluate at 10mth follow up. Hgb and HCT 10.8/33.4.  A full discussion of the nature of anticoagulants has been carried out.  A benefit/risk analysis has been presented to the patient, so that they understand the justification for choosing anticoagulation with Eliquis at this time.  The need for compliance is stressed.  Pt is aware to take the medication twice daily.  Side effects of potential bleeding are discussed, including unusual colored urine or stools, coughing up blood or coffee ground emesis, nose bleeds or serious fall or head trauma.  Discussed signs and symptoms of stroke. The patient should avoid any OTC items containing aspirin or ibuprofen.  Avoid alcohol consumption.   Call if any signs of abnormal bleeding.  Discussed financial obligations and resolved any difficulty in obtaining medication.  Next lab test test in 6 months.

## 2014-04-24 LAB — BASIC METABOLIC PANEL
BUN: 25 mg/dL — AB (ref 6–23)
CHLORIDE: 102 meq/L (ref 96–112)
CO2: 29 meq/L (ref 19–32)
CREATININE: 1.1 mg/dL (ref 0.4–1.2)
Calcium: 8.6 mg/dL (ref 8.4–10.5)
GFR: 49.05 mL/min — ABNORMAL LOW (ref >60.00)
Glucose, Bld: 97 mg/dL (ref 70–99)
Potassium: 4.2 mEq/L (ref 3.5–5.1)
Sodium: 139 mEq/L (ref 135–145)

## 2014-04-24 LAB — CBC
HCT: 33.4 % — ABNORMAL LOW (ref 36.0–46.0)
HEMOGLOBIN: 10.8 g/dL — AB (ref 12.0–15.0)
MCHC: 32.3 g/dL (ref 30.0–36.0)
MCV: 110.2 fl — ABNORMAL HIGH (ref 78.0–100.0)
Platelets: 135 10*3/uL — ABNORMAL LOW (ref 150.0–400.0)
RBC: 3.03 Mil/uL — ABNORMAL LOW (ref 3.87–5.11)
RDW: 18.2 % — AB (ref 11.5–15.5)
WBC: 8.2 10*3/uL (ref 4.0–10.5)

## 2014-04-24 NOTE — Progress Notes (Signed)
PCP: Gwendolyn Grant, MD  The patient presents today for routine electrophysiology followup.  Since last being seen in our clinic, the patient has done reasonably well.  She is tolerating anticoagulation without obvious bleeding. Today, she denies symptoms of palpitations, chest pain, shortness of breath, orthopnea, PND,  presyncope, syncope, or neurologic sequela.  The patient feels that she is tolerating medications without difficulties and is otherwise without complaint today.   Past Medical History  Diagnosis Date  . HEARING LOSS   . MITRAL VALVE INSUFF&AORTIC VALVE INSUFF     moderate MR  . PULMONARY HYPERTENSION   . Atrial fibrillation     permanent  . SICK SINUS SYNDROME     a. Tachybrady syndrome - Guidant PPM 10/2005.  . Edema 03/19/2009    due to venous insufficiency, chronic lymphedema  . URINARY INCONTINENCE   . Rheumatoid arthritis(714.0) dx clarified 2011    On prednisone  . BREAST CANCER 12/2006    ductal ca s/p L lumpectomy  . Diastolic dysfunction   . HYPERTENSION   . HYPERLIPIDEMIA   . GERD   . HYPOTHYROIDISM   . Hyperparathyroidism     2 lobes removed  . Venous insufficiency     chronic BLE edema  . Spinal stenosis   . Anemia     a. Macrocytic - normal B12, folate 08/2011. b. 3/6 heme positive stools 10/2011 - per PCP note, elected against colonscopy.  [name] TIA (transient ischemic attack) 05/2011  . Anxiety    Past Surgical History  Procedure Laterality Date  . Pacemaker placement  2005    SSS and syncope  . Cholecystectomy  04/06/99  . Abdominal hysterectomy  1970    Partial  . Left hip arthroscopy  1995  . Right hip arthroscopy  01/2001  . Carpal tunnel release  1998    bilateral  . Breast surgery  12/2006    Left breast lupectomy- Ductal CA  . Left parathyroidectomy  07/2008  . Esophagogastroduodenoscopy N/A 10/18/2012    Procedure: ESOPHAGOGASTRODUODENOSCOPY (EGD);  Surgeon: Ladene Artist, MD;  Location: [facility];  Service: Endoscopy;  Laterality:  N/A;    Current Outpatient Prescriptions  Medication Sig Dispense Refill  . apixaban (ELIQUIS) 2.5 MG TABS tablet Take 1 tablet (2.5 mg total) by mouth 2 (two) times daily. 60 tablet 11  . atorvastatin (LIPITOR) 10 MG tablet Take 10 mg by mouth at bedtime.    . Calcium-Phosphorus-Vitamin D (CALCIUM GUMMIES) [phone] MG-MG-UNIT CHEW Chew 1 each by mouth 2 (two) times daily. Gummies Calcium 500 mg Vitamin D 500 units Chew by mouth. Gummies Calcium 500 mg Vitamin D 500 units 60 tablet 11  . cholecalciferol (VITAMIN D) 1000 UNITS tablet Take 1,000 Units by mouth daily.    . citalopram (CELEXA) 20 MG tablet Take 20 mg by mouth daily.    . cyanocobalamin 500 MCG tablet Take 500 mcg by mouth daily.    . fentaNYL (DURAGESIC - DOSED MCG/HR) 100 MCG/HR Place 75 mcg onto the skin every 3 (three) days.    . folic acid (FOLVITE) 035 MCG tablet Take 400 mcg by mouth every morning.    . furosemide (LASIX) 20 MG tablet Take 20 mg by mouth every morning.    . gabapentin (NEURONTIN) 100 MG capsule Take 2 capsules (200 mg total) by mouth at bedtime. 60 capsule 5  . HYDROcodone-acetaminophen (NORCO/VICODIN) 5-325 MG per tablet Take 1 tablet by mouth every 6 (six) hours as needed for moderate pain. 30 tablet 0  .  isosorbide mononitrate (IMDUR) 30 MG 24 hr tablet Take 15 mg by mouth daily.    [name] levothyroxine (SYNTHROID, LEVOTHROID) 25 MCG tablet Take 25 mcg by mouth daily before breakfast.    . metoprolol succinate (TOPROL-XL) 50 MG 24 hr tablet TAKE (1) TABLET BY MOUTH TWICE DAILY WITH MEAL. 60 tablet 11  . Multiple Vitamin (MULTIVITAMIN) LIQD Take 5 mLs by mouth daily.    . pantoprazole (PROTONIX) 40 MG tablet Take 40 mg by mouth daily.    . potassium chloride (K-DUR) 10 MEQ tablet TAKE ONE TABLET BY MOUTH ONCE DAILY. 30 tablet 3  . predniSONE (DELTASONE) 5 MG tablet Take 5 mg by mouth daily with breakfast.    . sucralfate (CARAFATE) 1 GM/10ML suspension Take 1 g by mouth 2 (two) times daily.    . vitamin  B-12 (CYANOCOBALAMIN) 500 MCG tablet TAKE ONE TABLET BY MOUTH ONCE DAILY. 30 tablet 11  . nitroGLYCERIN (NITROSTAT) 0.4 MG SL tablet Place 0.4 mg under the tongue every 5 (five) minutes x 3 doses as needed for chest pain.    [name] ondansetron (ZOFRAN) 4 MG tablet Take 1 tablet (4 mg total) by mouth every 12 (twelve) hours as needed for nausea. For nausea (Patient not taking: Reported on 04/23/2014) 30 tablet 1   No current facility-administered medications for this visit.    Allergies  Allergen Reactions  . Sulfa Antibiotics Itching  . Tramadol Hcl Itching and Rash    History   Social History  . Marital Status: Married    Spouse Name: N/A    Number of Children: 6  . Years of Education: N/A   Occupational History  . Retired Agricultural engineer    Social History Main Topics  . Smoking status: Never Smoker   . Smokeless tobacco: Never Used     Comment: Lives at [facility] since 03/2009- married, lives with spouse. Moved here from [facility] to be near son  . Alcohol Use: No  . Drug Use: No  . Sexual Activity: Not Currently   Other Topics Concern  . Not on file   Social History Narrative    Family History  Problem Relation Age of Onset  . Ovarian cancer Mother   . Coronary artery disease Sister   . Coronary artery disease Brother   . Hypertension Mother     grandparent  . Lung cancer Father    Physical Exam: Filed Vitals:   04/23/14 1553  BP: 110/78  Pulse: 82  Height: 5\' 4"  (1.626 m)    GEN- The patient is elderly appearing, alert and oriented x 3 today.   Head- normocephalic, atraumatic Eyes-  Sclera clear, conjunctiva pink Ears- hearing intact Oropharynx- clear Neck- supple, no JVP Lymph- no cervical lymphadenopathy Lungs- Clear to ausculation bilaterally, normal work of breathing Chest- pacemaker pocket is well healed Heart- irregular rate and rhythm GI- soft, NT, ND, + BS Extremities- no clubbing, cyanosis, 1+ pedal edema with marked venous  insufficiency  Pacemaker interrogation- reviewed in detail today,  See PACEART report ekg today reveal afib with demand V pacing  Assessment and Plan:   1. afib Rate controlled She has a chads2vasc score of 6. She appears to be tolerating eliquis.2.5mg  BID bmet and cbc today  2. Symptomatic bradycardia Normal pacemaker function See Pace Art report No changes today  3. HTN Stable No change required today  4. Anemia Stable CBC today  Remote monitoring Return to see me in 1 year

## 2014-04-30 ENCOUNTER — Encounter: Payer: Self-pay | Admitting: Internal Medicine

## 2014-05-15 ENCOUNTER — Ambulatory Visit: Payer: Medicare Other | Admitting: Podiatrist

## 2014-05-22 ENCOUNTER — Ambulatory Visit: Payer: Medicare Other | Admitting: Podiatrist

## 2014-06-02 ENCOUNTER — Encounter: Payer: Self-pay | Admitting: Internal Medicine

## 2014-06-02 ENCOUNTER — Ambulatory Visit (INDEPENDENT_AMBULATORY_CARE_PROVIDER_SITE_OTHER): Payer: Medicare Other | Admitting: Internal Medicine

## 2014-06-02 ENCOUNTER — Encounter (INDEPENDENT_AMBULATORY_CARE_PROVIDER_SITE_OTHER): Payer: Medicare Other

## 2014-06-02 VITALS — BP 114/72 | HR 78 | Temp 98.0°F | Wt 143.0 lb

## 2014-06-02 DIAGNOSIS — R269 Unspecified abnormalities of gait and mobility: Secondary | ICD-10-CM

## 2014-06-02 DIAGNOSIS — Z87440 Personal history of urinary (tract) infections: Secondary | ICD-10-CM

## 2014-06-02 DIAGNOSIS — E039 Hypothyroidism, unspecified: Secondary | ICD-10-CM

## 2014-06-02 DIAGNOSIS — E785 Hyperlipidemia, unspecified: Secondary | ICD-10-CM

## 2014-06-02 DIAGNOSIS — I10 Essential (primary) hypertension: Secondary | ICD-10-CM

## 2014-06-02 DIAGNOSIS — Z23 Encounter for immunization: Secondary | ICD-10-CM

## 2014-06-02 DIAGNOSIS — G909 Disorder of the autonomic nervous system, unspecified: Secondary | ICD-10-CM

## 2014-06-02 LAB — URINALYSIS, ROUTINE W REFLEX MICROSCOPIC
BILIRUBIN URINE: NEGATIVE
HGB URINE DIPSTICK: NEGATIVE
KETONES UR: NEGATIVE
Nitrite: NEGATIVE
RBC / HPF: NONE SEEN (ref 0–?)
Specific Gravity, Urine: 1.01 (ref 1.000–1.030)
Total Protein, Urine: NEGATIVE
URINE GLUCOSE: NEGATIVE
Urobilinogen, UA: 0.2 (ref 0.0–1.0)
pH: 6 (ref 5.0–8.0)

## 2014-06-02 LAB — LIPID PANEL
CHOLESTEROL: 139 mg/dL (ref 0–200)
HDL: 70.5 mg/dL (ref >39.00)
LDL CALC: 34 mg/dL (ref 0–99)
NonHDL: 68.5
Total CHOL/HDL Ratio: 2
Triglycerides: 171 mg/dL — ABNORMAL HIGH (ref 0.0–149.0)
VLDL: 34.2 mg/dL (ref 0.0–40.0)

## 2014-06-02 LAB — TSH: TSH: 4.77 u[IU]/mL — ABNORMAL HIGH (ref 0.35–4.50)

## 2014-06-02 MED ORDER — GABAPENTIN 300 MG PO CAPS: 300.0000 mg | ORAL_CAPSULE | Freq: Every day | ORAL | Status: DC

## 2014-06-02 NOTE — Assessment & Plan Note (Signed)
On low dose synthroid recheck TSH annually, adjust as needed  Lab Results  Component Value Date   TSH 2.10 02/25/2013

## 2014-06-02 NOTE — Assessment & Plan Note (Signed)
Urinalysis and culture and sensitivity today for freq symptoms

## 2014-06-02 NOTE — Assessment & Plan Note (Signed)
Titrate up gabapentin now for nocturnal symptoms

## 2014-06-02 NOTE — Patient Instructions (Signed)
It was good to see you today.  We have reviewed your prior records including labs and tests today  Prevnar 13, pneumonia vaccine updated today  Test(s) ordered today. Your results will be released to [place] (or called to you) after review, usually within 72hours after test completion. If any changes need to be made, you will be notified at that same time.  Medications reviewed and updated Increase gabapentin to 300 mg at bedtime  Use Miralax, or generic over-the-counter, daily in 8 ounces fluid to help treat constipation tendency  we'll make referral to home health therapy for balance and gait training . Our office will contact you regarding appointment(s) once made.  Please schedule followup in 6 months, call sooner if problems.

## 2014-06-02 NOTE — Progress Notes (Signed)
Subjective:    Patient ID: Nicole Roberts, female    DOB: Jun 15, 1921, 81 y.o.   MRN: [id number]  HPI  Patient here for followup  Past Medical History  Diagnosis Date  . HEARING LOSS   . MITRAL VALVE INSUFF&AORTIC VALVE INSUFF     moderate MR  . PULMONARY HYPERTENSION   . Atrial fibrillation     permanent  . SICK SINUS SYNDROME     a. Tachybrady syndrome - Guidant PPM 10/2005.  . Edema 03/19/2009    due to venous insufficiency, chronic lymphedema  . URINARY INCONTINENCE   . Rheumatoid arthritis(714.0) dx clarified 2011    On prednisone  . BREAST CANCER 12/2006    ductal ca s/p L lumpectomy  . Diastolic dysfunction   . HYPERTENSION   . HYPERLIPIDEMIA   . GERD   . HYPOTHYROIDISM   . Hyperparathyroidism     2 lobes removed  . Venous insufficiency     chronic BLE edema  . Spinal stenosis   . Anemia     a. Macrocytic - normal B12, folate 08/2011. b. 3/6 heme positive stools 10/2011 - per PCP note, elected against colonscopy.  [name] TIA (transient ischemic attack) 05/2011  . Anxiety     Review of Systems  Constitutional: Positive for fatigue (chronic). Negative for fever and unexpected weight change.  Respiratory: Negative for cough and shortness of breath.   Cardiovascular: Negative for chest pain and palpitations. Leg swelling: chronic, but improved from baseline.  Gastrointestinal: Positive for constipation.  Genitourinary: Positive for frequency. Negative for dysuria and difficulty urinating.  Musculoskeletal: Positive for gait problem ("weak").  Neurological: Positive for weakness (generalized, poor balance -?resume 3 wk HH PT).  Psychiatric/Behavioral: Positive for sleep disturbance (falls asleep freq after meals).       Objective:    Physical Exam  Constitutional: She is oriented to person, place, and time. She appears well-developed and well-nourished. No distress.  Son at side  Cardiovascular: Normal rate, regular rhythm and normal heart sounds.   No murmur  heard. Pulmonary/Chest: Effort normal and breath sounds normal. No respiratory distress.  Musculoskeletal: Normal range of motion. She exhibits edema (trace BLE, wearing compression hose).  Neurological: She is alert and oriented to person, place, and time. No cranial nerve deficit.  Psychiatric: She has a normal mood and affect. Her behavior is normal. Judgment and thought content normal.  Tearful (approp) speaking of unexpected death of youngest son May 07, 2014    BP 114/72 mmHg  Pulse 78  Temp(Src) 98 F (36.7 C) (Oral)  Wt 143 lb (64.864 kg)  SpO2 97% Wt Readings from Last 3 Encounters:  06/02/14 143 lb (64.864 kg)  12/11/13 143 lb (64.864 kg)  11/06/13 135 lb (61.236 kg)     Lab Results  Component Value Date   WBC 8.2 04/23/2014   HGB 10.8* 04/23/2014   HCT 33.4* 04/23/2014   PLT 135.0* 04/23/2014   GLUCOSE 97 04/23/2014   CHOL 143 03/20/2012   TRIG 77 03/20/2012   HDL 70 03/20/2012   LDLDIRECT 131.4 12/28/2009   LDLCALC 58 03/20/2012   ALT 28 11/02/2013   AST 31 11/02/2013   NA 139 04/23/2014   K 4.2 04/23/2014   CL 102 04/23/2014   CREATININE 1.1 04/23/2014   BUN 25* 04/23/2014   CO2 29 04/23/2014   TSH 2.10 02/25/2013   INR 1.27 03/19/2012    Ct Head Wo Contrast  11/02/2013   CLINICAL DATA:  Dizziness beginning this morning.  EXAM: CT  HEAD WITHOUT CONTRAST  TECHNIQUE: Contiguous axial images were obtained from the base of the skull through the vertex without intravenous contrast.  COMPARISON:  None.  FINDINGS: Moderate generalized atrophy and white matter disease is present bilaterally. Lacunar infarcts in the basal ganglia bilaterally are likely remote. No significant cortical infarct is evident. No hemorrhage or mass lesion is present. The ventricles are proportionate to the degree of atrophy. No significant extra-axial fluid collection is present.  The paranasal sinuses and mastoid air cells are clear. The osseous skull is intact. Atherosclerotic calcifications are  present within the cavernous carotid arteries bilaterally.  IMPRESSION: 1. Moderate atrophy and diffuse white matter disease likely reflects the sequela of chronic microvascular ischemia. 2. Lacunar infarcts of the basal ganglia bilaterally are likely remote. 3. No significant posterior fossa infarct. No acute or focal lesion to explain the patient's symptoms.   Electronically Signed   By: Lawrence Santiago M.D.   On: 11/02/2013 11:35       Assessment & Plan:   Problem List Items Addressed This Visit    Autonomic neuropathy    Titrate up gabapentin now for nocturnal symptoms       Relevant Medications   gabapentin (NEURONTIN) capsule   Other Relevant Orders   Ambulatory referral to [facility]   Dyslipidemia    On statin -  Last lipids reviewed - check annually The current medical regimen is effective;  continue present plan and medications.        Relevant Orders   Lipid panel   Essential hypertension    ACE inhibitor discontinued because of progressive renal failure Remains on beta blocker as for A. Fib rate control Asymptomatic at this time  BP Readings from Last 3 Encounters:  06/02/14 114/72  04/23/14 110/78  12/11/13 131/77         Relevant Medications   metoprolol succinate (TOPROL-XL) 24 hr tablet   Other Relevant Orders   Urinalysis, Routine w reflex microscopic   History of recurrent UTIs    Urinalysis and culture and sensitivity today for freq symptoms       Relevant Orders   Urinalysis, Routine w reflex microscopic   Hypothyroidism - Primary    On low dose synthroid recheck TSH annually, adjust as needed  Lab Results  Component Value Date   TSH 2.10 02/25/2013        Relevant Medications   metoprolol succinate (TOPROL-XL) 24 hr tablet   Other Relevant Orders   TSH    Other Visit Diagnoses    Gait disorder        Relevant Orders    Ambulatory referral to Home Health        Gwendolyn Grant, MD

## 2014-06-02 NOTE — Assessment & Plan Note (Signed)
ACE inhibitor discontinued because of progressive renal failure Remains on beta blocker as for A. Fib rate control Asymptomatic at this time  BP Readings from Last 3 Encounters:  06/02/14 114/72  04/23/14 110/78  12/11/13 131/77

## 2014-06-02 NOTE — Assessment & Plan Note (Signed)
On statin -  Last lipids reviewed - check annually The current medical regimen is effective;  continue present plan and medications.

## 2014-06-09 ENCOUNTER — Other Ambulatory Visit: Payer: Self-pay

## 2014-06-09 MED ORDER — LEVOTHYROXINE SODIUM 50 MCG PO TABS
50.0000 ug | ORAL_TABLET | Freq: Every day | ORAL | Status: DC
Start: 2014-06-09 — End: 2015-11-17

## 2014-06-11 ENCOUNTER — Ambulatory Visit: Payer: Medicare Other | Admitting: Podiatrist

## 2014-06-19 ENCOUNTER — Ambulatory Visit: Payer: Medicare Other | Admitting: Podiatrist

## 2014-06-24 DIAGNOSIS — L0390 Cellulitis, unspecified: Secondary | ICD-10-CM | POA: Diagnosis not present

## 2014-07-01 DIAGNOSIS — G909 Disorder of the autonomic nervous system, unspecified: Secondary | ICD-10-CM | POA: Diagnosis not present

## 2014-07-03 ENCOUNTER — Ambulatory Visit: Payer: Medicare Other | Admitting: Podiatrist

## 2014-07-04 ENCOUNTER — Other Ambulatory Visit: Payer: Self-pay | Admitting: Internal Medicine

## 2014-07-07 DIAGNOSIS — M545 Low back pain: Secondary | ICD-10-CM

## 2014-07-10 ENCOUNTER — Other Ambulatory Visit: Payer: Self-pay

## 2014-07-10 MED ORDER — FUROSEMIDE 20 MG PO TABS: 20.0000 mg | ORAL_TABLET | Freq: Every day | ORAL | Status: DC

## 2014-07-24 ENCOUNTER — Ambulatory Visit (INDEPENDENT_AMBULATORY_CARE_PROVIDER_SITE_OTHER): Payer: Medicare Other | Admitting: Podiatrist

## 2014-07-24 ENCOUNTER — Encounter: Payer: Self-pay | Admitting: Podiatrist

## 2014-07-24 DIAGNOSIS — M79676 Pain in unspecified toe(s): Secondary | ICD-10-CM | POA: Diagnosis not present

## 2014-07-24 DIAGNOSIS — B351 Tinea unguium: Secondary | ICD-10-CM

## 2014-08-02 NOTE — Progress Notes (Signed)
HPI:  Patient presents today for follow up of foot and nail care. Denies any new complaints today.  Objective:  Patients chart is reviewed.  Vascular status reveals pedal pulses noted at 2 out of 4 dp and pt bilateral .  Neurological sensation is intact to Semmes Weinstein monofilament bilateral.  Patients nails are thickened, discolored, distrophic, friable and brittle with yellow-brown discoloration. Patient subjectively relates they are painful with shoes and with ambulation of bilateral feet.  Assessment:  Symptomatic onychomycosis  Plan:  Discussed treatment options and alternatives.  The symptomatic toenails were debrided through manual an mechanical means without complication.  Return appointment recommended at routine intervals of 3 months    Joeziah Voit, DPM  

## 2014-08-08 ENCOUNTER — Other Ambulatory Visit: Payer: Self-pay | Admitting: Internal Medicine

## 2014-08-08 NOTE — Telephone Encounter (Signed)
Pt husband called in and has a few questions about how pt is taking her meds.  He said that her feet are tingling and not sure if she is taking them correctly?    Best nub [phone]

## 2014-08-20 NOTE — Addendum Note (Signed)
Addended by: Lowella Dandy on: 08/20/2014 03:45 PM   Modules accepted: Orders

## 2014-08-20 NOTE — Telephone Encounter (Signed)
To clarify, the question is whether patient needs to come in to be seen or if gabapentin dosage can be increased back to 400MG 

## 2014-08-20 NOTE — Telephone Encounter (Signed)
Arnell Sieving is requesting that pt dose of gabapentin 300 mg changed to 400 mg.  The sx of feet tingling has returned since the dose decrease.   Spoke to pt and pt stated below:   Sx: burning in feet. Occurs mostly in the middle of the night and early morning. Nothing makes the the tingling better or worse. Pt stated that the sx was much better on the other med (gab 400 mg).

## 2014-08-20 NOTE — Telephone Encounter (Signed)
Nicole Roberts is following up on this request. Patient is still experiencing burning in feet. Please advise.

## 2014-08-21 MED ORDER — GABAPENTIN 400 MG PO CAPS: 400.0000 mg | ORAL_CAPSULE | Freq: Every day | ORAL | Status: DC

## 2014-09-08 DIAGNOSIS — M25511 Pain in right shoulder: Secondary | ICD-10-CM | POA: Diagnosis not present

## 2014-09-18 ENCOUNTER — Telehealth: Payer: Self-pay | Admitting: Internal Medicine

## 2014-09-18 NOTE — Telephone Encounter (Signed)
Nicole Roberts -[phone] [facility]     Verbal orders for  Pt to focus on walking, and shower transfers

## 2014-09-18 NOTE — Telephone Encounter (Signed)
Verbal okay given for PT to focus on walking and shower transfers.   4xweek for 2 weeks. RN states that she is advancing well.

## 2014-10-03 ENCOUNTER — Telehealth: Payer: Self-pay | Admitting: Internal Medicine

## 2014-10-03 NOTE — Telephone Encounter (Signed)
Nicole Roberts, patients physical therapist just wanted to let you know patient wasn't feeling well today and so his visit was cancelled

## 2014-10-06 ENCOUNTER — Telehealth: Payer: Self-pay | Admitting: Internal Medicine

## 2014-10-06 NOTE — Telephone Encounter (Signed)
Pt c/o Shortness Of Breath: STAT if SOB developed within the last 24 hours or pt is noticeably SOB on the phone  1. Are you currently SOB (can you hear that pt is SOB on the phone)? Daughter calling states that the pt has had these symptoms for about 3-4 weeks  2. How long have you been experiencing SOB?  3-4 weeks  3. Are you SOB when sitting or when up moving around? Not sure. Pt daughter says that this normally happens when she is working with the physical therapist    Made appt with Dr. Rayann Heman for 06/08 At 3:45p. Sending note to nurse.

## 2014-10-08 ENCOUNTER — Ambulatory Visit: Payer: Medicare Other | Admitting: Internal Medicine

## 2014-10-09 NOTE — Telephone Encounter (Signed)
Patient canceled her appointment for 10/08/14 due to transportation problems and rescheduled for 6/15

## 2014-10-13 ENCOUNTER — Ambulatory Visit: Payer: Medicare Other | Admitting: Family

## 2014-10-15 ENCOUNTER — Encounter: Payer: Self-pay | Admitting: Internal Medicine

## 2014-10-15 ENCOUNTER — Ambulatory Visit (INDEPENDENT_AMBULATORY_CARE_PROVIDER_SITE_OTHER): Payer: Medicare Other | Admitting: Internal Medicine

## 2014-10-15 VITALS — BP 110/78 | HR 82 | Ht 64.0 in | Wt 140.0 lb

## 2014-10-15 DIAGNOSIS — R0602 Shortness of breath: Secondary | ICD-10-CM

## 2014-10-15 DIAGNOSIS — R0789 Other chest pain: Secondary | ICD-10-CM

## 2014-10-15 DIAGNOSIS — I10 Essential (primary) hypertension: Secondary | ICD-10-CM | POA: Diagnosis not present

## 2014-10-15 DIAGNOSIS — R0600 Dyspnea, unspecified: Secondary | ICD-10-CM | POA: Insufficient documentation

## 2014-10-15 DIAGNOSIS — R001 Bradycardia, unspecified: Secondary | ICD-10-CM | POA: Diagnosis not present

## 2014-10-15 DIAGNOSIS — I4891 Unspecified atrial fibrillation: Secondary | ICD-10-CM | POA: Diagnosis not present

## 2014-10-15 LAB — CBC WITH DIFFERENTIAL/PLATELET
Basophils Absolute: 0.1 10*3/uL (ref 0.0–0.1)
Basophils Relative: 1.2 % (ref 0.0–3.0)
Eosinophils Absolute: 0 10*3/uL (ref 0.0–0.7)
Eosinophils Relative: 0.3 % (ref 0.0–5.0)
HCT: 32.7 % — ABNORMAL LOW (ref 36.0–46.0)
Hemoglobin: 10.9 g/dL — ABNORMAL LOW (ref 12.0–15.0)
LYMPHS PCT: 19.6 % (ref 12.0–46.0)
Lymphs Abs: 1.6 10*3/uL (ref 0.7–4.0)
MCHC: 33.3 g/dL (ref 30.0–36.0)
MONOS PCT: 8.4 % (ref 3.0–12.0)
Monocytes Absolute: 0.7 10*3/uL (ref 0.1–1.0)
NEUTROS PCT: 70.5 % (ref 43.0–77.0)
Neutro Abs: 5.6 10*3/uL (ref 1.4–7.7)
Platelets: 141 10*3/uL — ABNORMAL LOW (ref 150.0–400.0)
RBC: 2.94 Mil/uL — ABNORMAL LOW (ref 3.87–5.11)
RDW: 17.7 % — ABNORMAL HIGH (ref 11.5–15.5)
WBC: 7.9 10*3/uL (ref 4.0–10.5)

## 2014-10-15 LAB — BASIC METABOLIC PANEL
BUN: 24 mg/dL — AB (ref 6–23)
CO2: 31 meq/L (ref 19–32)
Calcium: 8.8 mg/dL (ref 8.4–10.5)
Chloride: 104 mEq/L (ref 96–112)
Creatinine, Ser: 1.22 mg/dL — ABNORMAL HIGH (ref 0.40–1.20)
GFR: 43.94 mL/min — AB (ref >60.00)
Glucose, Bld: 116 mg/dL — ABNORMAL HIGH (ref 70–99)
POTASSIUM: 4.1 meq/L (ref 3.5–5.1)
Sodium: 140 mEq/L (ref 135–145)

## 2014-10-15 LAB — CUP PACEART INCLINIC DEVICE CHECK
Brady Statistic RV Percent Paced: 41 %
Lead Channel Impedance Value: 550 Ohm
Lead Channel Sensing Intrinsic Amplitude: 9.1 mV
Lead Channel Setting Pacing Amplitude: 2.4 V
Lead Channel Setting Pacing Pulse Width: 0.8 ms
Lead Channel Setting Sensing Sensitivity: 2.5 mV
MDC IDC MSMT LEADCHNL RV PACING THRESHOLD AMPLITUDE: 0.8 V
MDC IDC MSMT LEADCHNL RV PACING THRESHOLD PULSEWIDTH: 0.8 ms
MDC IDC SESS DTM: 20160615162133
Pulse Gen Serial Number: 785093

## 2014-10-15 NOTE — Progress Notes (Signed)
Electrophysiology Office Note   Date:  10/15/2014   ID:  Nicole Roberts, DOB [birth year], MRN [id number]  PCP:  Gwendolyn Grant, MD   Primary Electrophysiologist: Thompson Grayer, MD    Chief Complaint  Patient presents with  . Atrial Fibrillation    pt c/o dizinees and heart pounding      History of Present Illness: Nicole Roberts is a 81 y.o. female who presents today for electrophysiology evaluation.   She continues to grieve the death of her son.  He died suddenly while in his yard in December. She also recently reconnected with with her sister who has 11 stents.  She seems very concerned about her heart at this time. She reports progressive SOB and chest tightness for several months.  Her daughter today does not think that she has been complaining about this until recently.  The patient has rare postural dizziness and occasional "heart pounding".  Today, she denies symptoms of orthopnea, PND, lower extremity edema, claudication, presyncope, syncope, bleeding, or neurologic sequela. The patient is tolerating medications without difficulties and is otherwise without complaint today.    Past Medical History  Diagnosis Date  . HEARING LOSS   . MITRAL VALVE INSUFF&AORTIC VALVE INSUFF     moderate MR  . PULMONARY HYPERTENSION   . Atrial fibrillation     permanent  . SICK SINUS SYNDROME     a. Tachybrady syndrome - Guidant PPM 10/2005.  . Edema 03/19/2009    due to venous insufficiency, chronic lymphedema  . URINARY INCONTINENCE   . Rheumatoid arthritis(714.0) dx clarified 2011    On prednisone  . BREAST CANCER 12/2006    ductal ca s/p L lumpectomy  . Diastolic dysfunction   . HYPERTENSION   . HYPERLIPIDEMIA   . GERD   . HYPOTHYROIDISM   . Hyperparathyroidism     2 lobes removed  . Venous insufficiency     chronic BLE edema  . Spinal stenosis   . Anemia     a. Macrocytic - normal B12, folate 08/2011. b. 3/6 heme positive stools 10/2011 - per PCP note, elected against  colonscopy.  [name] TIA (transient ischemic attack) 05/2011  . Anxiety    Past Surgical History  Procedure Laterality Date  . Pacemaker placement  2005    SSS and syncope  . Cholecystectomy  04/06/99  . Abdominal hysterectomy  1970    Partial  . Left hip arthroscopy  1995  . Right hip arthroscopy  01/2001  . Carpal tunnel release  1998    bilateral  . Breast surgery  12/2006    Left breast lupectomy- Ductal CA  . Left parathyroidectomy  07/2008  . Esophagogastroduodenoscopy N/A 10/18/2012    Procedure: ESOPHAGOGASTRODUODENOSCOPY (EGD);  Surgeon: Ladene Artist, MD;  Location: [facility];  Service: Endoscopy;  Laterality: N/A;     Current Outpatient Prescriptions  Medication Sig Dispense Refill  . apixaban (ELIQUIS) 2.5 MG TABS tablet Take 1 tablet (2.5 mg total) by mouth 2 (two) times daily. 60 tablet 11  . atorvastatin (LIPITOR) 10 MG tablet Take 10 mg by mouth at bedtime.    . cholecalciferol (VITAMIN D) 1000 UNITS tablet Take 1,000 Units by mouth daily.    . citalopram (CELEXA) 20 MG tablet Take 20 mg by mouth daily.    . cyanocobalamin 500 MCG tablet Take 500 mcg by mouth daily.    . fentaNYL (DURAGESIC - DOSED MCG/HR) 75 MCG/HR Place 75 mcg onto the skin every 3 (three) days.    [name]  folic acid (FOLVITE) 397 MCG tablet Take 400 mcg by mouth every morning.    . furosemide (LASIX) 20 MG tablet Take 1 tablet (20 mg total) by mouth daily. 90 tablet 1  . gabapentin (NEURONTIN) 400 MG capsule Take 1 capsule (400 mg total) by mouth at bedtime. 90 capsule 1  . HYDROcodone-acetaminophen (NORCO/VICODIN) 5-325 MG per tablet Take 1 tablet by mouth every 6 (six) hours as needed for moderate pain. 30 tablet 0  . isosorbide mononitrate (IMDUR) 30 MG 24 hr tablet Take 15 mg by mouth daily.    [name] levothyroxine (SYNTHROID, LEVOTHROID) 50 MCG tablet Take 1 tablet (50 mcg total) by mouth daily. 90 tablet 3  . metoprolol succinate (TOPROL-XL) 50 MG 24 hr tablet Take 1 tablet (50 mg total) by mouth 2 (two)  times daily. Take with or immediately following a meal. 60 tablet 11  . Multiple Vitamin (MULTIVITAMIN) LIQD Take 5 mLs by mouth daily.    . nitroGLYCERIN (NITROSTAT) 0.4 MG SL tablet Place 0.4 mg under the tongue every 5 (five) minutes x 3 doses as needed for chest pain.    [name] ondansetron (ZOFRAN) 4 MG tablet Take 1 tablet (4 mg total) by mouth every 12 (twelve) hours as needed for nausea. For nausea 30 tablet 1  . pantoprazole (PROTONIX) 40 MG tablet Take 40 mg by mouth daily.    . potassium chloride (K-DUR) 10 MEQ tablet TAKE ONE TABLET BY MOUTH ONCE DAILY. 30 tablet 3  . predniSONE (DELTASONE) 5 MG tablet Take 5 mg by mouth daily with breakfast.    . sucralfate (CARAFATE) 1 GM/10ML suspension Take 1 g by mouth 2 (two) times daily.    . vitamin B-12 (CYANOCOBALAMIN) 500 MCG tablet TAKE ONE TABLET BY MOUTH ONCE DAILY. 30 tablet 11   No current facility-administered medications for this visit.    Allergies:   Sulfa antibiotics and Tramadol hcl   Social History:  The patient  reports that she has never smoked. She has never used smokeless tobacco. She reports that she does not drink alcohol or use illicit drugs.   Family History:  The patient's family history includes Coronary artery disease in her brother and sister; Hypertension in her mother; Lung cancer in her father; Ovarian cancer in her mother.    ROS:  Please see the history of present illness.   All other systems are reviewed and negative.    PHYSICAL EXAM: VS:  BP 110/78 mmHg  Pulse 82  Ht 5\' 4"  (1.626 m)  Wt 63.504 kg (140 lb)  BMI 24.02 kg/m2  SpO2 96% , BMI Body mass index is 24.02 kg/(m^2). GEN: Well nourished, well developed, in no acute distress HEENT: normal Neck: no JVD, carotid bruits, or masses Cardiac: iRRR; no murmurs, rubs, or gallops,no edema  Respiratory:  clear to auscultation bilaterally, normal work of breathing GI: soft, nontender, nondistended, + BS MS: no deformity or atrophy Skin: warm and dry,  device pocket is well healed Neuro:  Strength and sensation are intact Psych: euthymic mood, full affect  EKG:  EKG is ordered today. The ekg ordered today shows afib, no ischemic ST/T changes  Device interrogation is reviewed today in detail.  See PaceArt for details.   Recent Labs: 11/02/2013: ALT 28 06/02/2014: TSH 4.77* 10/15/2014: BUN 24*; Creatinine, Ser 1.22*; Hemoglobin 10.9*; Platelets 141.0 Repeated and verified X2.*; Potassium 4.1; Sodium 140    Lipid Panel     Component Value Date/Time   CHOL 139 06/02/2014 1153   TRIG  171.0* 06/02/2014 1153   HDL 70.50 06/02/2014 1153   CHOLHDL 2 06/02/2014 1153   VLDL 34.2 06/02/2014 1153   LDLCALC 34 06/02/2014 1153   LDLDIRECT 131.4 12/28/2009 1013     Wt Readings from Last 3 Encounters:  10/15/14 63.504 kg (140 lb)  06/02/14 64.864 kg (143 lb)  12/11/13 64.864 kg (143 lb)       ASSESSMENT AND PLAN:  1.  SOB/ chest tightness May be more prominent in the setting of death of son and her recent conversations with her sister.  Given concerns of progressive symptoms, I will order a lexiscan myoview to further evaluate.  Myoview from 2011 was reviewed with patient and daughter today.  If repeat myoview is low risk, medical management is advised.  If high risk, consider further evaluation though I do think that a conservative approach is prudent long term. Check bmet/ CBC to evaluate for otherw causes of SOB. Echo from 2014 is reviewed.  Could repeat echo, though she is not a surgical candidate for valvular heart disease.  2. Permanent afib Check bmet, cbc on anticoagulation  3. Symptomatic bradycardia Normal pacemaker function See Pace Art report No changes today  4. HTN Stable No change required today  Follow-up with Truitt Merle in 6 months I will see in a year  Current medicines are reviewed at length with the patient today.   The patient does not have concerns regarding her medicines.  The following changes were  made today:  none  Labs/ tests ordered today include:  Orders Placed This Encounter  Procedures  . Basic metabolic panel  . CBC with Differential  . Myocardial Perfusion Imaging  . Implantable device check  . EKG 12-Lead     Signed, Thompson Grayer, MD  10/15/2014 10:36 PM     [address] [place] [address] [phone] (office) [phone] (fax)

## 2014-10-15 NOTE — Patient Instructions (Signed)
Medication Instructions:  Your physician recommends that you continue on your current medications as directed. Please refer to the Current Medication list given to you today.   Labwork: Your physician recommends that you return for lab work today: BMP/CBC   Testing/Procedures: Your physician has requested that you have a lexiscan myoview. For further information please visit [email or web address]. Please follow instruction sheet, as given.    Follow-Up: Your physician wants you to follow-up in: 6 months with Truitt Merle, NP You will receive a reminder letter in the mail two months in advance. If you don't receive a letter, please call our office to schedule the follow-up appointment.   Any Other Special Instructions Will Be Listed Below (If Applicable).

## 2014-10-20 ENCOUNTER — Encounter: Payer: Self-pay | Admitting: Internal Medicine

## 2014-10-22 ENCOUNTER — Encounter: Payer: Self-pay | Admitting: Internal Medicine

## 2014-10-28 ENCOUNTER — Ambulatory Visit (INDEPENDENT_AMBULATORY_CARE_PROVIDER_SITE_OTHER): Payer: Medicare Other | Admitting: Pharmacist

## 2014-10-28 ENCOUNTER — Telehealth: Payer: Self-pay | Admitting: Internal Medicine

## 2014-10-28 DIAGNOSIS — I4891 Unspecified atrial fibrillation: Secondary | ICD-10-CM | POA: Diagnosis not present

## 2014-10-28 DIAGNOSIS — M5137 Other intervertebral disc degeneration, lumbosacral region: Secondary | ICD-10-CM

## 2014-10-28 DIAGNOSIS — M4728 Other spondylosis with radiculopathy, sacral and sacrococcygeal region: Secondary | ICD-10-CM

## 2014-10-28 NOTE — Telephone Encounter (Signed)
RX for dme cushion entered and printed.   Will fax to [facility] Attn: Roxy Manns per [facility] request.

## 2014-10-28 NOTE — Telephone Encounter (Signed)
Nicole Roberts states that she is returning your call. She states that the patient is @ [facility] on [address] in [place]. She states that this is about a wheelchair pad RX. Please call her back to advise on how she should proceed.

## 2014-10-28 NOTE — Progress Notes (Signed)
Pt is on Eliquis 2.5mg  BID for Atrial Fibrillation.  She is here for her 6 month follow up.   Reviewed patients medication list.  Pt is not currently on any combined P-gp and strong CYP3A4 inhibitors/inducers (ketoconazole, traconazole, ritonavir, carbamazepine, phenytoin, rifampin, St. John's wort).  Reviewed labs.  SCr 1.22, Weight 63kg, CrCl- 30 mL/min.  She only meets one of the 3 criteria for the lower dose of Eliquis, but given her advanced age and borderline weight and CrCl, ok with continuing 2.5mg  BID.  Hgb and HCT stable.   A full discussion of the nature of anticoagulants has been carried out.  A benefit/risk analysis has been presented to the patient, so that they understand the justification for choosing anticoagulation with Eliquis at this time.  The need for compliance is stressed.  Pt is aware to take the medication twice daily.  Side effects of potential bleeding are discussed, including unusual colored urine or stools, coughing up blood or coffee ground emesis, nose bleeds or serious fall or head trauma.  Discussed signs and symptoms of stroke. The patient should avoid any OTC items containing aspirin or ibuprofen.  Avoid alcohol consumption.   Call if any signs of abnormal bleeding.  Discussed financial obligations and resolved any difficulty in obtaining medication.  Next lab test test in 6 months.

## 2014-10-29 NOTE — Telephone Encounter (Signed)
Will sign this AM thanks

## 2014-10-30 ENCOUNTER — Encounter (HOSPITAL_COMMUNITY): Payer: Medicare Other

## 2014-10-30 NOTE — Telephone Encounter (Signed)
Faxed order

## 2014-11-06 ENCOUNTER — Ambulatory Visit: Payer: Medicare Other | Admitting: Podiatry

## 2014-11-24 ENCOUNTER — Telehealth: Payer: Self-pay | Admitting: Internal Medicine

## 2014-11-24 NOTE — Telephone Encounter (Signed)
error 

## 2014-11-24 NOTE — Telephone Encounter (Deleted)
[facility] medical supplyPatient need a pressure release cousins.

## 2014-11-25 ENCOUNTER — Ambulatory Visit: Payer: Medicare Other | Admitting: Podiatry

## 2014-12-02 ENCOUNTER — Encounter: Payer: Self-pay | Admitting: Internal Medicine

## 2014-12-02 ENCOUNTER — Ambulatory Visit (INDEPENDENT_AMBULATORY_CARE_PROVIDER_SITE_OTHER): Payer: Medicare Other | Admitting: Internal Medicine

## 2014-12-02 VITALS — BP 112/78 | HR 66 | Temp 98.2°F | Ht 64.0 in | Wt 148.5 lb

## 2014-12-02 DIAGNOSIS — E039 Hypothyroidism, unspecified: Secondary | ICD-10-CM | POA: Diagnosis not present

## 2014-12-02 DIAGNOSIS — Z0000 Encounter for general adult medical examination without abnormal findings: Secondary | ICD-10-CM

## 2014-12-02 DIAGNOSIS — G909 Disorder of the autonomic nervous system, unspecified: Secondary | ICD-10-CM | POA: Diagnosis not present

## 2014-12-02 DIAGNOSIS — M069 Rheumatoid arthritis, unspecified: Secondary | ICD-10-CM

## 2014-12-02 DIAGNOSIS — S00222A Blister (nonthermal) of left eyelid and periocular area, initial encounter: Secondary | ICD-10-CM

## 2014-12-02 DIAGNOSIS — I10 Essential (primary) hypertension: Secondary | ICD-10-CM

## 2014-12-02 DIAGNOSIS — Z23 Encounter for immunization: Secondary | ICD-10-CM

## 2014-12-02 MED ORDER — ZOSTER VACCINE LIVE 19400 UNT/0.65ML ~~LOC~~ SOLR: 0.6500 mL | Freq: Once | SUBCUTANEOUS | Status: DC

## 2014-12-02 MED ORDER — GABAPENTIN 400 MG PO CAPS: 800.0000 mg | ORAL_CAPSULE | Freq: Every day | ORAL | Status: DC

## 2014-12-02 NOTE — Assessment & Plan Note (Signed)
On low dose synthroid recheck TSH annually, adjust as needed  Lab Results  Component Value Date   TSH 4.77* 06/02/2014

## 2014-12-02 NOTE — Assessment & Plan Note (Signed)
Titrate up gabapentin again now for nocturnal symptoms : 800 mg qhs

## 2014-12-02 NOTE — Progress Notes (Signed)
Subjective:    Patient ID: Nicole Roberts, female    DOB: 05/29/1921, 81 y.o.   MRN: [id number]  HPI   Here for medicare wellness  Diet: heart healthy Physical activity: sedentary Depression/mood screen: negative Hearing: intact to whispered voice Visual acuity: grossly normal, performs annual eye exam  ADLs: capable Fall risk: non ambulatory, reviewed Home safety: good Cognitive evaluation: intact to orientation, naming, recall and repetition EOL planning: adv directives, DNR/ I agree  I have personally reviewed and have noted 1. The patient's medical and social history 2. Their use of alcohol, tobacco or illicit drugs 3. Their current medications and supplements 4. The patient's functional ability including ADL's, fall risks, home safety risks and hearing or visual impairment. 5. Diet and physical activities 6. Evidence for depression or mood disorders  Reviewed chronic medical issues, interval event and current concerns   Past Medical History  Diagnosis Date  . HEARING LOSS   . MITRAL VALVE INSUFF&AORTIC VALVE INSUFF     moderate MR  . PULMONARY HYPERTENSION   . Atrial fibrillation     permanent  . SICK SINUS SYNDROME     a. Tachybrady syndrome - Guidant PPM 10/2005.  . Edema 03/19/2009    due to venous insufficiency, chronic lymphedema  . URINARY INCONTINENCE   . Rheumatoid arthritis(714.0) dx clarified 2011    On prednisone  . BREAST CANCER 12/2006    ductal ca s/p L lumpectomy  . Diastolic dysfunction   . HYPERTENSION   . HYPERLIPIDEMIA   . GERD   . HYPOTHYROIDISM   . Hyperparathyroidism     2 lobes removed  . Venous insufficiency     chronic BLE edema  . Spinal stenosis   . Anemia     a. Macrocytic - normal B12, folate 08/2011. b. 3/6 heme positive stools 10/2011 - per PCP note, elected against colonscopy.  [name] TIA (transient ischemic attack) 05/2011  . Anxiety    Family History  Problem Relation Age of Onset  . Ovarian cancer Mother   . Coronary  artery disease Sister   . Coronary artery disease Brother   . Hypertension Mother     grandparent  . Lung cancer Father    History  Substance Use Topics  . Smoking status: Never Smoker   . Smokeless tobacco: Never Used  . Alcohol Use: No    Review of Systems  Constitutional: Positive for fatigue. Negative for fever and unexpected weight change.  Eyes: Negative for redness (lower left eyelid).  Respiratory: Negative for cough, shortness of breath and wheezing.   Cardiovascular: Negative for chest pain, palpitations and leg swelling (no change chronic BLE sx).  Gastrointestinal: Negative for nausea, abdominal pain and diarrhea.  Musculoskeletal: Positive for arthralgias. Negative for joint swelling.  Neurological: Negative for dizziness, syncope, weakness, light-headedness and headaches.  Psychiatric/Behavioral: Positive for sleep disturbance (due to leg pain/burning at night). Negative for dysphoric mood. The patient is not nervous/anxious.   All other systems reviewed and are negative.  Patient Care Team: Rowe Clack, MD as PCP - General Unice Bailey, MD (Rheumatology) Thompson Grayer, MD (Cardiology) Inocencio Homes, DPM (Podiatry) Ladene Artist, MD (Gastroenterology)     Objective:    Physical Exam  Constitutional: She appears well-developed and well-nourished. No distress.  Eyes:  Left lower lid red and outturned "like a blister" per pt  Cardiovascular: Normal rate, regular rhythm and normal heart sounds.   No murmur heard. Pulmonary/Chest: Effort normal and breath sounds normal. No respiratory distress.  Musculoskeletal: She exhibits no edema.    BP 112/78 mmHg  Pulse 66  Temp(Src) 98.2 F (36.8 C) (Oral)  Ht 5\' 4"  (1.626 m)  Wt 148 lb 8 oz (67.359 kg)  BMI 25.48 kg/m2  SpO2 95% Wt Readings from Last 3 Encounters:  12/02/14 148 lb 8 oz (67.359 kg)  10/15/14 140 lb (63.504 kg)  06/02/14 143 lb (64.864 kg)     Lab Results  Component Value Date    WBC 7.9 10/15/2014   HGB 10.9* 10/15/2014   HCT 32.7* 10/15/2014   PLT 141.0 Repeated and verified X2.* 10/15/2014   GLUCOSE 116* 10/15/2014   CHOL 139 06/02/2014   TRIG 171.0* 06/02/2014   HDL 70.50 06/02/2014   LDLDIRECT 131.4 12/28/2009   LDLCALC 34 06/02/2014   ALT 28 11/02/2013   AST 31 11/02/2013   NA 140 10/15/2014   K 4.1 10/15/2014   CL 104 10/15/2014   CREATININE 1.22* 10/15/2014   BUN 24* 10/15/2014   CO2 31 10/15/2014   TSH 4.77* 06/02/2014   INR 1.27 03/19/2012    Ct Head Wo Contrast  11/02/2013   CLINICAL DATA:  Dizziness beginning this morning.  EXAM: CT HEAD WITHOUT CONTRAST  TECHNIQUE: Contiguous axial images were obtained from the base of the skull through the vertex without intravenous contrast.  COMPARISON:  None.  FINDINGS: Moderate generalized atrophy and white matter disease is present bilaterally. Lacunar infarcts in the basal ganglia bilaterally are likely remote. No significant cortical infarct is evident. No hemorrhage or mass lesion is present. The ventricles are proportionate to the degree of atrophy. No significant extra-axial fluid collection is present.  The paranasal sinuses and mastoid air cells are clear. The osseous skull is intact. Atherosclerotic calcifications are present within the cavernous carotid arteries bilaterally.  IMPRESSION: 1. Moderate atrophy and diffuse white matter disease likely reflects the sequela of chronic microvascular ischemia. 2. Lacunar infarcts of the basal ganglia bilaterally are likely remote. 3. No significant posterior fossa infarct. No acute or focal lesion to explain the patient's symptoms.   Electronically Signed   By: Lawrence Santiago M.D.   On: 11/02/2013 11:35       Assessment & Plan:   AWV/z00.00 - Today patient counseled on age appropriate routine health concerns for screening and prevention, each reviewed and up to date or declined. Immunizations reviewed and up to date or declined. Labs reviewed. Risk factors for  depression reviewed and negative. Hearing function and visual acuity are intact. ADLs screened and addressed as needed. Functional ability and level of safety reviewed and appropriate. Education, counseling and referrals performed based on assessed risks today. Patient provided with a copy of personalized plan for preventive services.  Shingles prescription provided to family today to investigate best price, will let us know if done outside of this office  Tdap updated today  Problem List Items Addressed This Visit    Autonomic neuropathy    Titrate up gabapentin again now for nocturnal symptoms : 800 mg qhs      Relevant Medications   gabapentin (NEURONTIN) 400 MG capsule   Essential hypertension    ACE inhibitor discontinued because of progressive renal failure Remains on beta blocker as for A. Fib rate control Asymptomatic at this time  BP Readings from Last 3 Encounters:  12/02/14 112/78  10/15/14 110/78  06/02/14 114/72        Hypothyroidism    On low dose synthroid recheck TSH annually, adjust as needed  Lab Results  Component Value  Date   TSH 4.77* 06/02/2014        Rheumatoid arthritis - Primary    patient previously following with rheumatology regarding same, currently without a provider Currently pain symptoms stable on prednisone as immunotherapy and fentanyl patch -no changes recommended by me today - will refill same so long as no flares       Other Visit Diagnoses    Blister of left eyelid, initial encounter        Relevant Orders    Ambulatory referral to Ophthalmology        Gwendolyn Grant, MD

## 2014-12-02 NOTE — Progress Notes (Signed)
Pre visit review using our clinic review tool, if applicable. No additional management support is needed unless otherwise documented below in the visit note. 

## 2014-12-02 NOTE — Assessment & Plan Note (Signed)
ACE inhibitor discontinued because of progressive renal failure Remains on beta blocker as for A. Fib rate control Asymptomatic at this time  BP Readings from Last 3 Encounters:  12/02/14 112/78  10/15/14 110/78  06/02/14 114/72

## 2014-12-02 NOTE — Assessment & Plan Note (Signed)
patient previously following with rheumatology regarding same, currently without a provider Currently pain symptoms stable on prednisone as immunotherapy and fentanyl patch -no changes recommended by me today - will refill same so long as no flares

## 2014-12-02 NOTE — Patient Instructions (Addendum)
It was good to see you today.  We have reviewed your prior records including labs and tests today  Medications reviewed and updated Increase gabapentin 800mg  at bedtime - No other changes recommended at this time.  we'll make referral to eye doctor for your left eye. Our office will contact you regarding appointment(s) once made.  Tdap today - Shingles prescription given to you - let us know when this is done  Please schedule followup in 4-6 months, call sooner if problems.

## 2014-12-09 ENCOUNTER — Telehealth: Payer: Self-pay | Admitting: Internal Medicine

## 2014-12-09 DIAGNOSIS — M5137 Other intervertebral disc degeneration, lumbosacral region: Secondary | ICD-10-CM

## 2014-12-09 DIAGNOSIS — M4727 Other spondylosis with radiculopathy, lumbosacral region: Secondary | ICD-10-CM

## 2014-12-09 NOTE — Telephone Encounter (Signed)
Nicole Roberts patient daughter,stated no one has called her to scheduled her referral appt, please advise

## 2014-12-10 ENCOUNTER — Ambulatory Visit (INDEPENDENT_AMBULATORY_CARE_PROVIDER_SITE_OTHER): Payer: Medicare Other

## 2014-12-10 ENCOUNTER — Encounter: Payer: Self-pay | Admitting: Podiatry

## 2014-12-10 ENCOUNTER — Ambulatory Visit (INDEPENDENT_AMBULATORY_CARE_PROVIDER_SITE_OTHER): Payer: Medicare Other | Admitting: Podiatry

## 2014-12-10 VITALS — BP 104/60 | HR 69 | Temp 98.2°F | Resp 12

## 2014-12-10 DIAGNOSIS — R52 Pain, unspecified: Secondary | ICD-10-CM

## 2014-12-10 DIAGNOSIS — S92912A Unspecified fracture of left toe(s), initial encounter for closed fracture: Secondary | ICD-10-CM

## 2014-12-10 NOTE — Patient Instructions (Signed)
Displaced fracture proximal phalanx fifth toe left foot with abrasion Apply topical antibiotic ointment abrasion on the toes, insert small piece a gauze and wrapped toes with 1 inch Coflex fourth and fifth toes Continue antibiotic ointment until skin heals Continue wrapping the toes until pain stops

## 2014-12-10 NOTE — Telephone Encounter (Signed)
Informed Pamala Hurry that the pt was contacted yesterday regarding the appt. Pamala Hurry also requested a cushion for pt wheel chair to be a 20X20. Will have order printed and ready for PCP to sign when she returns.

## 2014-12-10 NOTE — Progress Notes (Signed)
   Subjective:    Patient ID: Nicole Roberts, female    DOB: 1921-11-09, 81 y.o.   MRN: [id number]  HPI  This patient presents today with a one-day history of injury to the fourth fifth toes, left foot resulting from slipping when getting out of bed and falling to the floor also rubbing the toes against the carpeting. She complained of pain in the fourth and fifth toe area as well as bleeding and applied has applied gauze dressing and taping to the toes and presents for evaluation. She is nonambulatory sitting in a wheelchair most of the time.  Review of Systems  Cardiovascular: Positive for leg swelling.       Objective:   Physical Exam  Pleasant orientated 3  Vascular: Peripheral edema bilaterally No calf pain bilaterally DP and PT pulses 1/4 bilaterally  Neurological: Ankle reflexes trace reactive bilaterally  Dermatological: There is abrasions on the medial lateral services of the fourth and fifth toes. There are no open wounds. Localized edema and ecchymosis noted in around the fourth and fifth left toes. There are no open wounds  Musculoskeletal: Hammertoe deformities 1-5 bilaterally The fourth and fifth toes left rectus in appearance  Xray examination left foot 12/10/2014  Oblique fracture of the proximal phalanx fifth toe with lateral displacement of the distal fragment with. Increased soft tissue density noted in the toe  Radiographic impression: Fracture proximal phalanx fifth toe left foot     Assessment & Plan:   Assessment: Fracture proximal phalanx fifth toe left with some displacement Friction rub abrasions fourth and fifth left toes  Plan: I reviewed review the results of the examination x-ray with patient and her daughter present treatment room. As patient primarily sedentary with minimal weightbearing I'm not recommending aggressive realignment of the proximal phalanx. Ultimately I do think that this fracture will heal. I advised that they apply  topical antibiotic ointment between the fourth and fifth left toes and insert thin layer gauze and wrapped fourth and fifth toes with 1 inch Coflex and not over tighten daily until pain and swelling and Patient to wear a soft shoe issues really nonambulatory and I'm concerned that a rigid surgical shoe my cause her to fall.  Reappoint 6 weeks for progress x-ray. Also patient is requesting nail debridement in the future and will evaluate fifth left toe fracture as well as debrided toenails at next scheduled visit

## 2014-12-16 NOTE — Telephone Encounter (Signed)
The order has been faxed to [facility] attn: Roxy Manns.

## 2014-12-29 ENCOUNTER — Telehealth: Payer: Self-pay | Admitting: *Deleted

## 2014-12-29 MED ORDER — FENTANYL 75 MCG/HR TD PT72: 75.0000 ug | MEDICATED_PATCH | TRANSDERMAL | Status: DC

## 2014-12-29 NOTE — Telephone Encounter (Signed)
Will fill but next needs to come from Dr. Asa Lente as she has never filled for the patient . Per her note she intends to do this.

## 2014-12-29 NOTE — Telephone Encounter (Signed)
Left msg on triage pt is needing refills on her Fentanyl Patches. MD out of office this week pls advise on refill...Johny Chess

## 2014-12-29 NOTE — Telephone Encounter (Addendum)
Called [facility] spoke with cindy inform rx ready for pick-up...Johny Chess

## 2015-01-14 ENCOUNTER — Ambulatory Visit: Payer: Medicare Other | Admitting: Podiatry

## 2015-01-20 ENCOUNTER — Telehealth: Payer: Self-pay | Admitting: *Deleted

## 2015-01-20 ENCOUNTER — Other Ambulatory Visit: Payer: Self-pay | Admitting: Internal Medicine

## 2015-01-20 NOTE — Telephone Encounter (Signed)
Notified Cindy with md response...Johny Chess

## 2015-01-20 NOTE — Telephone Encounter (Signed)
It looks like she just got a prescription for 10 patches 2 weeks ago from Dr. Doug Sou

## 2015-01-20 NOTE — Telephone Encounter (Signed)
Receive call from [place] @ [facility] stating pt needing refill on her fentanyl patches pls call whn ready for pick-up. MD is out of the office pls advise on refill..../LMB

## 2015-01-22 ENCOUNTER — Encounter (HOSPITAL_COMMUNITY): Payer: Self-pay | Admitting: Family Medicine

## 2015-01-22 ENCOUNTER — Inpatient Hospital Stay (HOSPITAL_COMMUNITY)
Admission: EM | Admit: 2015-01-22 | Discharge: 2015-02-01 | DRG: 871 | Disposition: A | Discharge status: Skilled Nursing Facility | Payer: Medicare Other | Attending: Internal Medicine | Admitting: Internal Medicine

## 2015-01-22 DIAGNOSIS — E039 Hypothyroidism, unspecified: Secondary | ICD-10-CM | POA: Diagnosis present

## 2015-01-22 DIAGNOSIS — M4800 Spinal stenosis, site unspecified: Secondary | ICD-10-CM | POA: Diagnosis present

## 2015-01-22 DIAGNOSIS — R945 Abnormal results of liver function studies: Secondary | ICD-10-CM

## 2015-01-22 DIAGNOSIS — F419 Anxiety disorder, unspecified: Secondary | ICD-10-CM | POA: Diagnosis present

## 2015-01-22 DIAGNOSIS — R52 Pain, unspecified: Secondary | ICD-10-CM

## 2015-01-22 DIAGNOSIS — Z8673 Personal history of transient ischemic attack (TIA), and cerebral infarction without residual deficits: Secondary | ICD-10-CM

## 2015-01-22 DIAGNOSIS — E43 Unspecified severe protein-calorie malnutrition: Secondary | ICD-10-CM | POA: Diagnosis present

## 2015-01-22 DIAGNOSIS — R7881 Bacteremia: Secondary | ICD-10-CM

## 2015-01-22 DIAGNOSIS — Z993 Dependence on wheelchair: Secondary | ICD-10-CM

## 2015-01-22 DIAGNOSIS — K2960 Other gastritis without bleeding: Secondary | ICD-10-CM | POA: Diagnosis present

## 2015-01-22 DIAGNOSIS — H9190 Unspecified hearing loss, unspecified ear: Secondary | ICD-10-CM | POA: Diagnosis present

## 2015-01-22 DIAGNOSIS — Z882 Allergy status to sulfonamides status: Secondary | ICD-10-CM

## 2015-01-22 DIAGNOSIS — K2980 Duodenitis without bleeding: Secondary | ICD-10-CM | POA: Diagnosis present

## 2015-01-22 DIAGNOSIS — I4891 Unspecified atrial fibrillation: Secondary | ICD-10-CM | POA: Diagnosis present

## 2015-01-22 DIAGNOSIS — Z801 Family history of malignant neoplasm of trachea, bronchus and lung: Secondary | ICD-10-CM

## 2015-01-22 DIAGNOSIS — Z66 Do not resuscitate: Secondary | ICD-10-CM | POA: Diagnosis present

## 2015-01-22 DIAGNOSIS — K2970 Gastritis, unspecified, without bleeding: Secondary | ICD-10-CM

## 2015-01-22 DIAGNOSIS — Z853 Personal history of malignant neoplasm of breast: Secondary | ICD-10-CM

## 2015-01-22 DIAGNOSIS — E876 Hypokalemia: Secondary | ICD-10-CM | POA: Diagnosis not present

## 2015-01-22 DIAGNOSIS — Z8711 Personal history of peptic ulcer disease: Secondary | ICD-10-CM

## 2015-01-22 DIAGNOSIS — I482 Chronic atrial fibrillation: Secondary | ICD-10-CM | POA: Diagnosis present

## 2015-01-22 DIAGNOSIS — Z79891 Long term (current) use of opiate analgesic: Secondary | ICD-10-CM

## 2015-01-22 DIAGNOSIS — Z7952 Long term (current) use of systemic steroids: Secondary | ICD-10-CM

## 2015-01-22 DIAGNOSIS — R7989 Other specified abnormal findings of blood chemistry: Secondary | ICD-10-CM | POA: Insufficient documentation

## 2015-01-22 DIAGNOSIS — F329 Major depressive disorder, single episode, unspecified: Secondary | ICD-10-CM | POA: Diagnosis present

## 2015-01-22 DIAGNOSIS — D649 Anemia, unspecified: Secondary | ICD-10-CM | POA: Diagnosis present

## 2015-01-22 DIAGNOSIS — I130 Hypertensive heart and chronic kidney disease with heart failure and stage 1 through stage 4 chronic kidney disease, or unspecified chronic kidney disease: Secondary | ICD-10-CM | POA: Diagnosis present

## 2015-01-22 DIAGNOSIS — Z7901 Long term (current) use of anticoagulants: Secondary | ICD-10-CM

## 2015-01-22 DIAGNOSIS — F32A Depression, unspecified: Secondary | ICD-10-CM | POA: Diagnosis present

## 2015-01-22 DIAGNOSIS — N183 Chronic kidney disease, stage 3 unspecified: Secondary | ICD-10-CM | POA: Diagnosis present

## 2015-01-22 DIAGNOSIS — R197 Diarrhea, unspecified: Secondary | ICD-10-CM | POA: Insufficient documentation

## 2015-01-22 DIAGNOSIS — B001 Herpesviral vesicular dermatitis: Secondary | ICD-10-CM | POA: Diagnosis present

## 2015-01-22 DIAGNOSIS — R51 Headache: Secondary | ICD-10-CM | POA: Diagnosis not present

## 2015-01-22 DIAGNOSIS — Z885 Allergy status to narcotic agent status: Secondary | ICD-10-CM

## 2015-01-22 DIAGNOSIS — E785 Hyperlipidemia, unspecified: Secondary | ICD-10-CM | POA: Diagnosis present

## 2015-01-22 DIAGNOSIS — B9620 Unspecified Escherichia coli [E. coli] as the cause of diseases classified elsewhere: Secondary | ICD-10-CM | POA: Diagnosis present

## 2015-01-22 DIAGNOSIS — K8309 Other cholangitis: Secondary | ICD-10-CM

## 2015-01-22 DIAGNOSIS — R1013 Epigastric pain: Secondary | ICD-10-CM | POA: Diagnosis present

## 2015-01-22 DIAGNOSIS — R7401 Elevation of levels of liver transaminase levels: Secondary | ICD-10-CM | POA: Diagnosis present

## 2015-01-22 DIAGNOSIS — I5042 Chronic combined systolic (congestive) and diastolic (congestive) heart failure: Secondary | ICD-10-CM | POA: Diagnosis present

## 2015-01-22 DIAGNOSIS — Z90710 Acquired absence of both cervix and uterus: Secondary | ICD-10-CM

## 2015-01-22 DIAGNOSIS — Z8041 Family history of malignant neoplasm of ovary: Secondary | ICD-10-CM

## 2015-01-22 DIAGNOSIS — E213 Hyperparathyroidism, unspecified: Secondary | ICD-10-CM | POA: Diagnosis present

## 2015-01-22 DIAGNOSIS — R609 Edema, unspecified: Secondary | ICD-10-CM

## 2015-01-22 DIAGNOSIS — K859 Acute pancreatitis without necrosis or infection, unspecified: Secondary | ICD-10-CM | POA: Diagnosis present

## 2015-01-22 DIAGNOSIS — Z79899 Other long term (current) drug therapy: Secondary | ICD-10-CM

## 2015-01-22 DIAGNOSIS — R740 Nonspecific elevation of levels of transaminase and lactic acid dehydrogenase [LDH]: Secondary | ICD-10-CM

## 2015-01-22 DIAGNOSIS — I272 Other secondary pulmonary hypertension: Secondary | ICD-10-CM | POA: Diagnosis present

## 2015-01-22 DIAGNOSIS — Z9049 Acquired absence of other specified parts of digestive tract: Secondary | ICD-10-CM

## 2015-01-22 DIAGNOSIS — E440 Moderate protein-calorie malnutrition: Secondary | ICD-10-CM | POA: Diagnosis present

## 2015-01-22 DIAGNOSIS — G9340 Encephalopathy, unspecified: Secondary | ICD-10-CM | POA: Diagnosis not present

## 2015-01-22 DIAGNOSIS — Z8249 Family history of ischemic heart disease and other diseases of the circulatory system: Secondary | ICD-10-CM

## 2015-01-22 DIAGNOSIS — A4151 Sepsis due to Escherichia coli [E. coli]: Secondary | ICD-10-CM | POA: Diagnosis not present

## 2015-01-22 DIAGNOSIS — D696 Thrombocytopenia, unspecified: Secondary | ICD-10-CM | POA: Diagnosis not present

## 2015-01-22 DIAGNOSIS — I872 Venous insufficiency (chronic) (peripheral): Secondary | ICD-10-CM | POA: Diagnosis present

## 2015-01-22 DIAGNOSIS — N390 Urinary tract infection, site not specified: Secondary | ICD-10-CM | POA: Diagnosis present

## 2015-01-22 DIAGNOSIS — M069 Rheumatoid arthritis, unspecified: Secondary | ICD-10-CM | POA: Diagnosis present

## 2015-01-22 DIAGNOSIS — A4150 Gram-negative sepsis, unspecified: Secondary | ICD-10-CM | POA: Diagnosis present

## 2015-01-22 DIAGNOSIS — K8050 Calculus of bile duct without cholangitis or cholecystitis without obstruction: Secondary | ICD-10-CM | POA: Diagnosis present

## 2015-01-22 DIAGNOSIS — I129 Hypertensive chronic kidney disease with stage 1 through stage 4 chronic kidney disease, or unspecified chronic kidney disease: Secondary | ICD-10-CM | POA: Diagnosis present

## 2015-01-22 DIAGNOSIS — Z950 Presence of cardiac pacemaker: Secondary | ICD-10-CM

## 2015-01-22 DIAGNOSIS — A419 Sepsis, unspecified organism: Secondary | ICD-10-CM

## 2015-01-22 DIAGNOSIS — K219 Gastro-esophageal reflux disease without esophagitis: Secondary | ICD-10-CM | POA: Diagnosis present

## 2015-01-22 DIAGNOSIS — K2990 Gastroduodenitis, unspecified, without bleeding: Secondary | ICD-10-CM

## 2015-01-22 DIAGNOSIS — R509 Fever, unspecified: Secondary | ICD-10-CM

## 2015-01-22 DIAGNOSIS — G909 Disorder of the autonomic nervous system, unspecified: Secondary | ICD-10-CM | POA: Diagnosis present

## 2015-01-22 DIAGNOSIS — G629 Polyneuropathy, unspecified: Secondary | ICD-10-CM

## 2015-01-22 DIAGNOSIS — I10 Essential (primary) hypertension: Secondary | ICD-10-CM | POA: Diagnosis present

## 2015-01-22 DIAGNOSIS — D72829 Elevated white blood cell count, unspecified: Secondary | ICD-10-CM | POA: Diagnosis present

## 2015-01-22 DIAGNOSIS — F0390 Unspecified dementia without behavioral disturbance: Secondary | ICD-10-CM | POA: Diagnosis present

## 2015-01-22 DIAGNOSIS — I080 Rheumatic disorders of both mitral and aortic valves: Secondary | ICD-10-CM | POA: Diagnosis present

## 2015-01-22 DIAGNOSIS — R600 Localized edema: Secondary | ICD-10-CM | POA: Diagnosis present

## 2015-01-22 NOTE — ED Notes (Signed)
Patient is from [place] and is experiencing upper abd pain. Pain started around 20:00 last night. Also, experiencing chills and loose bowel movements.

## 2015-01-23 ENCOUNTER — Inpatient Hospital Stay (HOSPITAL_COMMUNITY): Payer: Medicare Other

## 2015-01-23 ENCOUNTER — Emergency Department (HOSPITAL_COMMUNITY): Payer: Medicare Other

## 2015-01-23 DIAGNOSIS — Z90710 Acquired absence of both cervix and uterus: Secondary | ICD-10-CM | POA: Diagnosis not present

## 2015-01-23 DIAGNOSIS — I129 Hypertensive chronic kidney disease with stage 1 through stage 4 chronic kidney disease, or unspecified chronic kidney disease: Secondary | ICD-10-CM | POA: Diagnosis present

## 2015-01-23 DIAGNOSIS — G629 Polyneuropathy, unspecified: Secondary | ICD-10-CM

## 2015-01-23 DIAGNOSIS — E039 Hypothyroidism, unspecified: Secondary | ICD-10-CM | POA: Diagnosis present

## 2015-01-23 DIAGNOSIS — K830 Cholangitis: Secondary | ICD-10-CM | POA: Diagnosis not present

## 2015-01-23 DIAGNOSIS — K2960 Other gastritis without bleeding: Secondary | ICD-10-CM | POA: Diagnosis present

## 2015-01-23 DIAGNOSIS — I4891 Unspecified atrial fibrillation: Secondary | ICD-10-CM | POA: Diagnosis not present

## 2015-01-23 DIAGNOSIS — N390 Urinary tract infection, site not specified: Secondary | ICD-10-CM | POA: Diagnosis present

## 2015-01-23 DIAGNOSIS — E213 Hyperparathyroidism, unspecified: Secondary | ICD-10-CM | POA: Diagnosis present

## 2015-01-23 DIAGNOSIS — I10 Essential (primary) hypertension: Secondary | ICD-10-CM | POA: Diagnosis not present

## 2015-01-23 DIAGNOSIS — Z9049 Acquired absence of other specified parts of digestive tract: Secondary | ICD-10-CM | POA: Diagnosis not present

## 2015-01-23 DIAGNOSIS — I482 Chronic atrial fibrillation: Secondary | ICD-10-CM

## 2015-01-23 DIAGNOSIS — M069 Rheumatoid arthritis, unspecified: Secondary | ICD-10-CM

## 2015-01-23 DIAGNOSIS — K851 Biliary acute pancreatitis: Secondary | ICD-10-CM | POA: Diagnosis not present

## 2015-01-23 DIAGNOSIS — K858 Other acute pancreatitis: Secondary | ICD-10-CM | POA: Diagnosis not present

## 2015-01-23 DIAGNOSIS — Z993 Dependence on wheelchair: Secondary | ICD-10-CM | POA: Diagnosis not present

## 2015-01-23 DIAGNOSIS — R7989 Other specified abnormal findings of blood chemistry: Secondary | ICD-10-CM | POA: Diagnosis not present

## 2015-01-23 DIAGNOSIS — D649 Anemia, unspecified: Secondary | ICD-10-CM | POA: Diagnosis present

## 2015-01-23 DIAGNOSIS — Z66 Do not resuscitate: Secondary | ICD-10-CM | POA: Diagnosis present

## 2015-01-23 DIAGNOSIS — B9620 Unspecified Escherichia coli [E. coli] as the cause of diseases classified elsewhere: Secondary | ICD-10-CM | POA: Diagnosis present

## 2015-01-23 DIAGNOSIS — Z885 Allergy status to narcotic agent status: Secondary | ICD-10-CM | POA: Diagnosis not present

## 2015-01-23 DIAGNOSIS — G9340 Encephalopathy, unspecified: Secondary | ICD-10-CM | POA: Diagnosis not present

## 2015-01-23 DIAGNOSIS — I5042 Chronic combined systolic (congestive) and diastolic (congestive) heart failure: Secondary | ICD-10-CM | POA: Diagnosis present

## 2015-01-23 DIAGNOSIS — F329 Major depressive disorder, single episode, unspecified: Secondary | ICD-10-CM | POA: Diagnosis present

## 2015-01-23 DIAGNOSIS — Z7952 Long term (current) use of systemic steroids: Secondary | ICD-10-CM | POA: Diagnosis not present

## 2015-01-23 DIAGNOSIS — B001 Herpesviral vesicular dermatitis: Secondary | ICD-10-CM | POA: Diagnosis present

## 2015-01-23 DIAGNOSIS — Z8249 Family history of ischemic heart disease and other diseases of the circulatory system: Secondary | ICD-10-CM | POA: Diagnosis not present

## 2015-01-23 DIAGNOSIS — Z7901 Long term (current) use of anticoagulants: Secondary | ICD-10-CM | POA: Diagnosis not present

## 2015-01-23 DIAGNOSIS — D72829 Elevated white blood cell count, unspecified: Secondary | ICD-10-CM | POA: Diagnosis present

## 2015-01-23 DIAGNOSIS — K850 Idiopathic acute pancreatitis: Secondary | ICD-10-CM | POA: Diagnosis not present

## 2015-01-23 DIAGNOSIS — F419 Anxiety disorder, unspecified: Secondary | ICD-10-CM | POA: Diagnosis present

## 2015-01-23 DIAGNOSIS — R7881 Bacteremia: Secondary | ICD-10-CM | POA: Diagnosis not present

## 2015-01-23 DIAGNOSIS — F0390 Unspecified dementia without behavioral disturbance: Secondary | ICD-10-CM | POA: Diagnosis present

## 2015-01-23 DIAGNOSIS — R197 Diarrhea, unspecified: Secondary | ICD-10-CM | POA: Diagnosis not present

## 2015-01-23 DIAGNOSIS — I130 Hypertensive heart and chronic kidney disease with heart failure and stage 1 through stage 4 chronic kidney disease, or unspecified chronic kidney disease: Secondary | ICD-10-CM | POA: Diagnosis present

## 2015-01-23 DIAGNOSIS — A4151 Sepsis due to Escherichia coli [E. coli]: Secondary | ICD-10-CM | POA: Diagnosis present

## 2015-01-23 DIAGNOSIS — N183 Chronic kidney disease, stage 3 (moderate): Secondary | ICD-10-CM | POA: Diagnosis present

## 2015-01-23 DIAGNOSIS — E440 Moderate protein-calorie malnutrition: Secondary | ICD-10-CM | POA: Diagnosis present

## 2015-01-23 DIAGNOSIS — R945 Abnormal results of liver function studies: Secondary | ICD-10-CM | POA: Diagnosis present

## 2015-01-23 DIAGNOSIS — R609 Edema, unspecified: Secondary | ICD-10-CM | POA: Diagnosis not present

## 2015-01-23 DIAGNOSIS — R1013 Epigastric pain: Secondary | ICD-10-CM | POA: Diagnosis present

## 2015-01-23 DIAGNOSIS — Z8673 Personal history of transient ischemic attack (TIA), and cerebral infarction without residual deficits: Secondary | ICD-10-CM | POA: Diagnosis not present

## 2015-01-23 DIAGNOSIS — I272 Other secondary pulmonary hypertension: Secondary | ICD-10-CM | POA: Diagnosis present

## 2015-01-23 DIAGNOSIS — E785 Hyperlipidemia, unspecified: Secondary | ICD-10-CM | POA: Diagnosis present

## 2015-01-23 DIAGNOSIS — M4800 Spinal stenosis, site unspecified: Secondary | ICD-10-CM | POA: Diagnosis present

## 2015-01-23 DIAGNOSIS — K8050 Calculus of bile duct without cholangitis or cholecystitis without obstruction: Secondary | ICD-10-CM | POA: Diagnosis present

## 2015-01-23 DIAGNOSIS — E876 Hypokalemia: Secondary | ICD-10-CM | POA: Diagnosis not present

## 2015-01-23 DIAGNOSIS — K859 Acute pancreatitis without necrosis or infection, unspecified: Secondary | ICD-10-CM | POA: Diagnosis present

## 2015-01-23 DIAGNOSIS — R51 Headache: Secondary | ICD-10-CM | POA: Diagnosis not present

## 2015-01-23 DIAGNOSIS — D696 Thrombocytopenia, unspecified: Secondary | ICD-10-CM | POA: Diagnosis not present

## 2015-01-23 DIAGNOSIS — R7401 Elevation of levels of liver transaminase levels: Secondary | ICD-10-CM | POA: Diagnosis present

## 2015-01-23 DIAGNOSIS — Z8041 Family history of malignant neoplasm of ovary: Secondary | ICD-10-CM | POA: Diagnosis not present

## 2015-01-23 DIAGNOSIS — I480 Paroxysmal atrial fibrillation: Secondary | ICD-10-CM | POA: Diagnosis not present

## 2015-01-23 DIAGNOSIS — Z853 Personal history of malignant neoplasm of breast: Secondary | ICD-10-CM | POA: Diagnosis not present

## 2015-01-23 DIAGNOSIS — I481 Persistent atrial fibrillation: Secondary | ICD-10-CM | POA: Diagnosis not present

## 2015-01-23 DIAGNOSIS — E038 Other specified hypothyroidism: Secondary | ICD-10-CM

## 2015-01-23 DIAGNOSIS — R933 Abnormal findings on diagnostic imaging of other parts of digestive tract: Secondary | ICD-10-CM | POA: Diagnosis not present

## 2015-01-23 DIAGNOSIS — Z8711 Personal history of peptic ulcer disease: Secondary | ICD-10-CM | POA: Diagnosis not present

## 2015-01-23 DIAGNOSIS — K219 Gastro-esophageal reflux disease without esophagitis: Secondary | ICD-10-CM

## 2015-01-23 DIAGNOSIS — Z801 Family history of malignant neoplasm of trachea, bronchus and lung: Secondary | ICD-10-CM | POA: Diagnosis not present

## 2015-01-23 DIAGNOSIS — I872 Venous insufficiency (chronic) (peripheral): Secondary | ICD-10-CM | POA: Diagnosis present

## 2015-01-23 DIAGNOSIS — Z882 Allergy status to sulfonamides status: Secondary | ICD-10-CM | POA: Diagnosis not present

## 2015-01-23 DIAGNOSIS — H9190 Unspecified hearing loss, unspecified ear: Secondary | ICD-10-CM | POA: Diagnosis present

## 2015-01-23 DIAGNOSIS — K2980 Duodenitis without bleeding: Secondary | ICD-10-CM | POA: Diagnosis present

## 2015-01-23 DIAGNOSIS — Z950 Presence of cardiac pacemaker: Secondary | ICD-10-CM | POA: Diagnosis not present

## 2015-01-23 DIAGNOSIS — R740 Nonspecific elevation of levels of transaminase and lactic acid dehydrogenase [LDH]: Secondary | ICD-10-CM

## 2015-01-23 DIAGNOSIS — Z79899 Other long term (current) drug therapy: Secondary | ICD-10-CM | POA: Diagnosis not present

## 2015-01-23 DIAGNOSIS — I080 Rheumatic disorders of both mitral and aortic valves: Secondary | ICD-10-CM | POA: Diagnosis present

## 2015-01-23 DIAGNOSIS — E43 Unspecified severe protein-calorie malnutrition: Secondary | ICD-10-CM | POA: Diagnosis present

## 2015-01-23 DIAGNOSIS — Z79891 Long term (current) use of opiate analgesic: Secondary | ICD-10-CM | POA: Diagnosis not present

## 2015-01-23 LAB — CBC WITH DIFFERENTIAL/PLATELET
BASOS ABS: 0 10*3/uL (ref 0.0–0.1)
Basophils Relative: 0 %
EOS ABS: 0 10*3/uL (ref 0.0–0.7)
Eosinophils Relative: 0 %
HEMATOCRIT: 37.8 % (ref 36.0–46.0)
HEMOGLOBIN: 12.3 g/dL (ref 12.0–15.0)
LYMPHS PCT: 4 %
Lymphs Abs: 0.6 10*3/uL — ABNORMAL LOW (ref 0.7–4.0)
MCH: 36.6 pg — ABNORMAL HIGH (ref 26.0–34.0)
MCHC: 32.5 g/dL (ref 30.0–36.0)
MCV: 112.5 fL — ABNORMAL HIGH (ref 78.0–100.0)
MONOS PCT: 2 %
Monocytes Absolute: 0.3 10*3/uL (ref 0.1–1.0)
NEUTROS ABS: 13.5 10*3/uL — AB (ref 1.7–7.7)
NEUTROS PCT: 94 %
Platelets: 144 10*3/uL — ABNORMAL LOW (ref 150–400)
RBC: 3.36 MIL/uL — ABNORMAL LOW (ref 3.87–5.11)
RDW: 16.2 % — ABNORMAL HIGH (ref 11.5–15.5)
WBC: 14.4 10*3/uL — ABNORMAL HIGH (ref 4.0–10.5)

## 2015-01-23 LAB — COMPREHENSIVE METABOLIC PANEL
ALBUMIN: 3.6 g/dL (ref 3.5–5.0)
ALK PHOS: 214 U/L — AB (ref 38–126)
ALT: 461 U/L — AB (ref 14–54)
AST: 813 U/L — AB (ref 15–41)
Anion gap: 9 (ref 5–15)
BILIRUBIN TOTAL: 2.8 mg/dL — AB (ref 0.3–1.2)
BUN: 25 mg/dL — AB (ref 6–20)
CALCIUM: 8.9 mg/dL (ref 8.9–10.3)
CO2: 26 mmol/L (ref 22–32)
CREATININE: 1.15 mg/dL — AB (ref 0.44–1.00)
Chloride: 106 mmol/L (ref 101–111)
GFR calc Af Amer: 47 mL/min — ABNORMAL LOW (ref >60)
GFR calc non Af Amer: 41 mL/min — ABNORMAL LOW (ref >60)
GLUCOSE: 78 mg/dL (ref 65–99)
Potassium: 3.4 mmol/L — ABNORMAL LOW (ref 3.5–5.1)
SODIUM: 141 mmol/L (ref 135–145)
TOTAL PROTEIN: 6.7 g/dL (ref 6.5–8.1)

## 2015-01-23 LAB — URINE MICROSCOPIC-ADD ON

## 2015-01-23 LAB — LIPASE, BLOOD
Lipase: 1692 U/L — ABNORMAL HIGH (ref 22–51)
Lipase: 720 U/L — ABNORMAL HIGH (ref 22–51)

## 2015-01-23 LAB — URINALYSIS, ROUTINE W REFLEX MICROSCOPIC
Glucose, UA: NEGATIVE mg/dL
Ketones, ur: NEGATIVE mg/dL
Nitrite: NEGATIVE
PROTEIN: NEGATIVE mg/dL
Specific Gravity, Urine: 1.009 (ref 1.005–1.030)
UROBILINOGEN UA: 1 mg/dL (ref 0.0–1.0)
pH: 5.5 (ref 5.0–8.0)

## 2015-01-23 MED ORDER — METOPROLOL SUCCINATE ER 50 MG PO TB24
50.0000 mg | ORAL_TABLET | Freq: Two times a day (BID) | ORAL | Status: DC
  Administered 2015-01-24 – 2015-01-31 (×16): 50 mg via ORAL
  Filled 2015-01-23 (×20): qty 1

## 2015-01-23 MED ORDER — SODIUM CHLORIDE 0.9 % IV SOLN
INTRAVENOUS | Status: DC
  Administered 2015-01-23 – 2015-01-29 (×7): via INTRAVENOUS

## 2015-01-23 MED ORDER — ESTROGENS, CONJUGATED 0.625 MG/GM VA CREA
1.0000 | TOPICAL_CREAM | VAGINAL | Status: DC
  Administered 2015-01-23: 1 via VAGINAL
  Filled 2015-01-23: qty 30

## 2015-01-23 MED ORDER — SODIUM CHLORIDE 0.9 % IV SOLN
1000.0000 mL | Freq: Once | INTRAVENOUS | Status: AC
  Administered 2015-01-23: 1000 mL via INTRAVENOUS

## 2015-01-23 MED ORDER — POTASSIUM CHLORIDE 10 MEQ/100ML IV SOLN
10.0000 meq | INTRAVENOUS | Status: AC
  Administered 2015-01-23 (×3): 10 meq via INTRAVENOUS
  Filled 2015-01-23 (×3): qty 100

## 2015-01-23 MED ORDER — FENTANYL 75 MCG/HR TD PT72
75.0000 ug | MEDICATED_PATCH | TRANSDERMAL | Status: DC
  Administered 2015-01-23 – 2015-01-29 (×3): 75 ug via TRANSDERMAL
  Filled 2015-01-23 (×3): qty 1

## 2015-01-23 MED ORDER — LEVOTHYROXINE SODIUM 50 MCG PO TABS
50.0000 ug | ORAL_TABLET | Freq: Every day | ORAL | Status: DC
  Administered 2015-01-24 – 2015-02-01 (×9): 50 ug via ORAL
  Filled 2015-01-23 (×14): qty 1

## 2015-01-23 MED ORDER — FUROSEMIDE 10 MG/ML IJ SOLN
20.0000 mg | Freq: Every day | INTRAMUSCULAR | Status: DC
  Administered 2015-01-23: 20 mg via INTRAVENOUS
  Filled 2015-01-23 (×3): qty 2

## 2015-01-23 MED ORDER — POLYVINYL ALCOHOL 1.4 % OP SOLN
1.0000 [drp] | Freq: Three times a day (TID) | OPHTHALMIC | Status: DC
  Administered 2015-01-23 – 2015-01-31 (×11): 1 [drp] via OPHTHALMIC
  Filled 2015-01-23: qty 15

## 2015-01-23 MED ORDER — ONDANSETRON HCL 4 MG/2ML IJ SOLN
4.0000 mg | Freq: Three times a day (TID) | INTRAMUSCULAR | Status: AC | PRN
  Administered 2015-01-23: 4 mg via INTRAVENOUS
  Filled 2015-01-23: qty 2

## 2015-01-23 MED ORDER — CETYLPYRIDINIUM CHLORIDE 0.05 % MT LIQD
7.0000 mL | Freq: Two times a day (BID) | OROMUCOSAL | Status: DC
  Administered 2015-01-23 – 2015-01-31 (×15): 7 mL via OROMUCOSAL

## 2015-01-23 MED ORDER — CHLORHEXIDINE GLUCONATE 0.12 % MT SOLN
15.0000 mL | Freq: Two times a day (BID) | OROMUCOSAL | Status: DC
  Administered 2015-01-23 – 2015-01-26 (×7): 15 mL via OROMUCOSAL
  Filled 2015-01-23 (×7): qty 15

## 2015-01-23 MED ORDER — PANTOPRAZOLE SODIUM 40 MG PO TBEC
40.0000 mg | DELAYED_RELEASE_TABLET | Freq: Every day | ORAL | Status: DC
  Administered 2015-01-24 – 2015-01-28 (×5): 40 mg via ORAL
  Filled 2015-01-23 (×6): qty 1

## 2015-01-23 MED ORDER — IOHEXOL 300 MG/ML  SOLN
50.0000 mL | Freq: Once | INTRAMUSCULAR | Status: AC | PRN
  Administered 2015-01-23: 50 mL via ORAL

## 2015-01-23 MED ORDER — ISOSORBIDE MONONITRATE 15 MG HALF TABLET
15.0000 mg | ORAL_TABLET | Freq: Every day | ORAL | Status: DC
  Administered 2015-01-24 – 2015-01-31 (×8): 15 mg via ORAL
  Filled 2015-01-23 (×13): qty 1

## 2015-01-23 MED ORDER — APIXABAN 2.5 MG PO TABS
2.5000 mg | ORAL_TABLET | Freq: Two times a day (BID) | ORAL | Status: DC
  Administered 2015-01-24 – 2015-01-27 (×7): 2.5 mg via ORAL
  Filled 2015-01-23 (×10): qty 1

## 2015-01-23 MED ORDER — GABAPENTIN 400 MG PO CAPS
800.0000 mg | ORAL_CAPSULE | Freq: Every day | ORAL | Status: DC
  Administered 2015-01-24 – 2015-01-25 (×2): 800 mg via ORAL
  Administered 2015-01-26: 400 mg via ORAL
  Administered 2015-01-27 – 2015-01-30 (×4): 800 mg via ORAL
  Administered 2015-01-31: 400 mg via ORAL
  Filled 2015-01-23 (×10): qty 2

## 2015-01-23 MED ORDER — MORPHINE SULFATE (PF) 2 MG/ML IV SOLN
2.0000 mg | INTRAVENOUS | Status: DC | PRN
  Administered 2015-01-23 – 2015-01-28 (×4): 2 mg via INTRAVENOUS
  Filled 2015-01-23 (×4): qty 1

## 2015-01-23 MED ORDER — SODIUM CHLORIDE 0.9 % IV SOLN
INTRAVENOUS | Status: DC
  Administered 2015-01-23: 06:00:00 via INTRAVENOUS

## 2015-01-23 MED ORDER — ATORVASTATIN CALCIUM 10 MG PO TABS
10.0000 mg | ORAL_TABLET | Freq: Every day | ORAL | Status: DC
  Filled 2015-01-23: qty 1

## 2015-01-23 MED ORDER — MORPHINE SULFATE (PF) 2 MG/ML IV SOLN: 2.0000 mg | INTRAVENOUS | Status: DC | PRN

## 2015-01-23 MED ORDER — FOLIC ACID 0.5 MG HALF TAB
500.0000 ug | ORAL_TABLET | Freq: Every morning | ORAL | Status: DC
  Administered 2015-01-24 – 2015-01-31 (×7): 0.5 mg via ORAL
  Filled 2015-01-23 (×10): qty 1

## 2015-01-23 MED ORDER — ONDANSETRON HCL 4 MG/2ML IJ SOLN
4.0000 mg | Freq: Once | INTRAMUSCULAR | Status: AC
  Administered 2015-01-23: 4 mg via INTRAVENOUS
  Filled 2015-01-23: qty 2

## 2015-01-23 MED ORDER — CITALOPRAM HYDROBROMIDE 20 MG PO TABS
20.0000 mg | ORAL_TABLET | Freq: Every day | ORAL | Status: DC
  Administered 2015-01-24 – 2015-01-31 (×8): 20 mg via ORAL
  Filled 2015-01-23 (×10): qty 1

## 2015-01-23 MED ORDER — DEXTROSE 5 % IV SOLN
1.0000 g | INTRAVENOUS | Status: DC
  Administered 2015-01-23: 1 g via INTRAVENOUS
  Filled 2015-01-23: qty 10

## 2015-01-23 MED ORDER — FENTANYL 75 MCG/HR TD PT72: 75.0000 ug | MEDICATED_PATCH | TRANSDERMAL | Status: DC

## 2015-01-23 MED ORDER — PREDNISONE 5 MG PO TABS
5.0000 mg | ORAL_TABLET | Freq: Every day | ORAL | Status: DC
  Administered 2015-01-24 – 2015-02-01 (×9): 5 mg via ORAL
  Filled 2015-01-23 (×14): qty 1

## 2015-01-23 NOTE — Progress Notes (Signed)
Patient scheduled to have SLP evaluation for swallow screen prior to administration of medication per MD to ensure quality of swallowing. Patient is currently NPO. Patient has clear speech and follows commands. Will continue to monitor.

## 2015-01-23 NOTE — Evaluation (Signed)
Physical Therapy Evaluation Patient Details Name: Angelo Caroll MRN: [id number] DOB: [birth year] Today's Date: 01/23/2015   History of Present Illness  81 yo female admitted with acute pancreatitis. Hx of pulm HTN, RA, breast cancer, TIA, spinal stenosis  Clinical Impression  Bed level eval only. Mod assist to roll in bed. Pt was drowsy during session and could not keep eyes open mostly. No family present to provide PLOF. Pt states she does not walk. Unsure if pt is from ALF or SNF. At this time, recommend SNF unless pt is from an ALF and they are able to provide current level of care.     Follow Up Recommendations SNF    Equipment Recommendations  None recommended by PT    Recommendations for Other Services       Precautions / Restrictions Precautions Precautions: Fall Restrictions Weight Bearing Restrictions: No      Mobility  Bed Mobility Overal bed mobility: Needs Assistance Bed Mobility: Rolling Rolling: Mod assist         General bed mobility comments: Rolled to L side-Mod assist. Increased time  Transfers                 General transfer comment: NT-pt too drowsy.   Ambulation/Gait                Stairs            Wheelchair Mobility    Modified Rankin (Stroke Patients Only)       Balance                                             Pertinent Vitals/Pain Pain Assessment: No/denies pain    Home Living Family/patient expects to be discharged to:: Unsure                      Prior Function Level of Independence: Needs assistance   Gait / Transfers Assistance Needed: per pt, non ambulatory. No family present during session           Hand Dominance        Extremity/Trunk Assessment   Upper Extremity Assessment: Generalized weakness           Lower Extremity Assessment: Generalized weakness;RLE deficits/detail;LLE deficits/detail RLE Deficits / Details: edema in foot. hip/knee flexion  at least 2/5. LLE Deficits / Details: edema in foot. hip/knee flexion at least 2/5.      Communication   Communication: No difficulties  Cognition Arousal/Alertness: Lethargic Behavior During Therapy: WFL for tasks assessed/performed Overall Cognitive Status: Difficult to assess                      General Comments      Exercises        Assessment/Plan    PT Assessment Patient needs continued PT services  PT Diagnosis Generalized weakness   PT Problem List Decreased strength;Decreased range of motion;Decreased activity tolerance;Decreased mobility;Decreased balance  PT Treatment Interventions Functional mobility training;Therapeutic activities;Patient/family education;Therapeutic exercise   PT Goals (Current goals can be found in the Care Plan section) Acute Rehab PT Goals Patient Stated Goal: none statd PT Goal Formulation: Patient unable to participate in goal setting Time For Goal Achievement: 02/06/15 Potential to Achieve Goals: Poor    Frequency Min 2X/week   Barriers to discharge        Co-evaluation  End of Session   Activity Tolerance: Patient tolerated treatment well Patient left: in bed;with call bell/phone within reach;with bed alarm set           Time: 1638-4665 PT Time Calculation (min) (ACUTE ONLY): 8 min   Charges:   PT Evaluation $Initial PT Evaluation Tier I: 1 Procedure     PT G Codes:        Weston Anna, MPT Pager: [phone]

## 2015-01-23 NOTE — Progress Notes (Signed)
CSW received referral that pt admitted from [facility].   CSW contacted pt son, Arnell Sieving via telephone and left voice message.  CSW contacted [facility] and facility reports that pt is wheelchair bound at baseline and facility assist with pt transfers. CSW discussed PT note and [facility] states that they feel they could continue to meet pt needs.   CSW to complete full psychosocial assessment once able to speak with pt son.  CSW to continue to follow.  Alison Murray, MSW, [facility] [phone]

## 2015-01-23 NOTE — ED Notes (Signed)
Performed patient hygiene. Pt had green, semi-loose stool. Changed sheets and incontinence pad.

## 2015-01-23 NOTE — H&P (Signed)
[facility] History and Physical  Nicole Roberts FXJ:[id number] DOB: Jun 16, 1921 DOA: 01/22/2015  Referring physician: EDP PCP: Gwendolyn Grant, MD   Chief Complaint: Abd pain   HPI: Nicole Roberts is a 81 y.o. female who presents to the ED with abdominal pain.  Symptoms ongoing for the past couple of days, worsening.  Severe pain that is intermittent in frequency, located n epigastric area.  She has had nausea and decreased BMs, no vomiting.  Review of Systems: Systems reviewed.  As above, otherwise negative  Past Medical History  Diagnosis Date  . HEARING LOSS   . MITRAL VALVE INSUFF&AORTIC VALVE INSUFF     moderate MR  . PULMONARY HYPERTENSION   . Atrial fibrillation     permanent  . SICK SINUS SYNDROME     a. Tachybrady syndrome - Guidant PPM 10/2005.  . Edema 03/19/2009    due to venous insufficiency, chronic lymphedema  . URINARY INCONTINENCE   . Rheumatoid arthritis(714.0) dx clarified 2011    On prednisone  . BREAST CANCER 12/2006    ductal ca s/p L lumpectomy  . Diastolic dysfunction   . HYPERTENSION   . HYPERLIPIDEMIA   . GERD   . HYPOTHYROIDISM   . Hyperparathyroidism     2 lobes removed  . Venous insufficiency     chronic BLE edema  . Spinal stenosis   . Anemia     a. Macrocytic - normal B12, folate 08/2011. b. 3/6 heme positive stools 10/2011 - per PCP note, elected against colonscopy.  [name] TIA (transient ischemic attack) 05/2011  . Anxiety    Past Surgical History  Procedure Laterality Date  . Pacemaker placement  2005    SSS and syncope  . Cholecystectomy  04/06/99  . Abdominal hysterectomy  1970    Partial  . Left hip arthroscopy  1995  . Right hip arthroscopy  01/2001  . Carpal tunnel release  1998    bilateral  . Breast surgery  12/2006    Left breast lupectomy- Ductal CA  . Left parathyroidectomy  07/2008  . Esophagogastroduodenoscopy N/A 10/18/2012    Procedure: ESOPHAGOGASTRODUODENOSCOPY (EGD);  Surgeon: Ladene Artist, MD;  Location: [facility];  Service: Endoscopy;  Laterality: N/A;   Social History:  reports that she has never smoked. She has never used smokeless tobacco. She reports that she does not drink alcohol or use illicit drugs.  Allergies  Allergen Reactions  . Sulfa Antibiotics Itching  . Tramadol Hcl Itching and Rash    Family History  Problem Relation Age of Onset  . Ovarian cancer Mother   . Coronary artery disease Sister   . Coronary artery disease Brother   . Hypertension Mother     grandparent  . Lung cancer Father      Prior to Admission medications   Medication Sig Start Date End Date Taking? Authorizing Kalle Bernath  alum & mag hydroxide-simeth (MAALOX/MYLANTA) 200-200-20 MG/5ML suspension Take 30 mLs by mouth every 6 (six) hours as needed for indigestion or heartburn.   Yes Historical Annaleigh Steinmeyer, MD  apixaban (ELIQUIS) 2.5 MG TABS tablet Take 1 tablet (2.5 mg total) by mouth 2 (two) times daily. 03/08/13  Yes Thompson Grayer, MD  Artificial Tear Ointment (ARTIFICIAL TEARS) ointment Place 1 drop into both eyes 3 (three) times daily.   Yes Historical Kitiara Hintze, MD  atorvastatin (LIPITOR) 10 MG tablet Take 10 mg by mouth at bedtime.   Yes Historical Avril Busser, MD  calcium citrate-vitamin D (CITRACAL+D) 315-200 MG-UNIT per tablet Take 1 tablet  by mouth 2 (two) times daily.   Yes Historical Hines Kloss, MD  cholecalciferol (VITAMIN D) 1000 UNITS tablet Take 1,000 Units by mouth daily.   Yes Historical Kynsie Falkner, MD  citalopram (CELEXA) 20 MG tablet Take 20 mg by mouth daily.   Yes Historical Takoya Jonas, MD  conjugated estrogens (PREMARIN) vaginal cream Place 1 Applicatorful vaginally 3 (three) times a week.   Yes Historical Zakee Deerman, MD  fentaNYL (DURAGESIC - DOSED MCG/HR) 75 MCG/HR Place 1 patch (75 mcg total) onto the skin every 3 (three) days. 12/29/14  Yes Olga Millers, MD  folic acid (FOLVITE) 440 MCG tablet Take 400 mcg by mouth every morning.   Yes Historical Ananias Kolander, MD  furosemide (LASIX) 20 MG tablet  Take 1 tablet (20 mg total) by mouth daily. 07/10/14  Yes Rowe Clack, MD  gabapentin (NEURONTIN) 400 MG capsule Take 2 capsules (800 mg total) by mouth at bedtime. 12/02/14  Yes Rowe Clack, MD  HYDROcodone-acetaminophen (NORCO/VICODIN) 5-325 MG per tablet Take 1 tablet by mouth every 6 (six) hours as needed for moderate pain.   Yes Historical Tiegan Jambor, MD  isosorbide mononitrate (IMDUR) 30 MG 24 hr tablet Take 15 mg by mouth daily.   Yes Historical Zabdi Mis, MD  levothyroxine (SYNTHROID, LEVOTHROID) 50 MCG tablet Take 1 tablet (50 mcg total) by mouth daily. 06/09/14  Yes Rowe Clack, MD  metoprolol succinate (TOPROL-XL) 50 MG 24 hr tablet Take 1 tablet (50 mg total) by mouth 2 (two) times daily. Take with or immediately following a meal. 06/02/14  Yes Rowe Clack, MD  Multiple Vitamin (MULTIVITAMIN) LIQD Take 5 mLs by mouth daily.   Yes Historical Alexx Giambra, MD  nitroGLYCERIN (NITROSTAT) 0.4 MG SL tablet Place 0.4 mg under the tongue every 5 (five) minutes x 3 doses as needed for chest pain. 03/20/12  Yes Roger A Arguello, PA-C  ondansetron (ZOFRAN) 4 MG tablet Take 1 tablet (4 mg total) by mouth every 12 (twelve) hours as needed for nausea. For nausea 02/25/13  Yes Rowe Clack, MD  pantoprazole (PROTONIX) 40 MG tablet Take 40 mg by mouth daily.   Yes Historical Nishaan Stanke, MD  potassium chloride (K-DUR) 10 MEQ tablet TAKE ONE TABLET BY MOUTH ONCE DAILY. 12/20/13  Yes Rowe Clack, MD  predniSONE (DELTASONE) 5 MG tablet Take 5 mg by mouth daily with breakfast.   Yes Historical Jossue Rubenstein, MD  sucralfate (CARAFATE) 1 GM/10ML suspension Take 1 g by mouth 2 (two) times daily.   Yes Historical Maryjo Ragon, MD  vitamin B-12 (CYANOCOBALAMIN) 500 MCG tablet TAKE ONE TABLET BY MOUTH ONCE DAILY. 11/20/13  Yes Rowe Clack, MD  zoster vaccine live, PF, (ZOSTAVAX) 34742 UNT/0.65ML injection Inject 19,400 Units into the skin once. 12/02/14   Rowe Clack, MD   Physical  Exam: Filed Vitals:   01/23/15 0233  BP: 99/47  Pulse: 105  Temp:   Resp: 16    BP 99/47 mmHg  Pulse 105  Temp(Src) 99.8 F (37.7 C) (Oral)  Resp 16  SpO2 96%  General Appearance:    Alert, oriented, no distress, appears stated age  Head:    Normocephalic, atraumatic  Eyes:    PERRL, EOMI, sclera non-icteric        Nose:   Nares without drainage or epistaxis. Mucosa, turbinates normal  Throat:   Moist mucous membranes. Oropharynx without erythema or exudate.  Has blood on lips from what looks like tiny lip abrasion from teeth, she denies fall or tooth pain.  Neck:  Supple. No carotid bruits.  No thyromegaly.  No lymphadenopathy.   Back:     No CVA tenderness, no spinal tenderness  Lungs:     Clear to auscultation bilaterally, without wheezes, rhonchi or rales  Chest wall:    No tenderness to palpitation  Heart:    Regular rate and rhythm without murmurs, gallops, rubs  Abdomen:     Soft, tender with guarding in epigastric area, nondistended, normal bowel sounds, no organomegaly  Genitalia:    deferred  Rectal:    deferred  Extremities:   No clubbing, cyanosis or edema.  Pulses:   2+ and symmetric all extremities  Skin:   Skin color, texture, turgor normal, no rashes or lesions  Lymph nodes:   Cervical, supraclavicular, and axillary nodes normal  Neurologic:   CNII-XII intact. Normal strength, sensation and reflexes      throughout    Labs on Admission:  Basic Metabolic Panel:  Recent Labs Lab 01/23/15 0042  NA 141  K 3.4*  CL 106  CO2 26  GLUCOSE 78  BUN 25*  CREATININE 1.15*  CALCIUM 8.9   Liver Function Tests:  Recent Labs Lab 01/23/15 0042  AST 813*  ALT 461*  ALKPHOS 214*  BILITOT 2.8*  PROT 6.7  ALBUMIN 3.6    Recent Labs Lab 01/23/15 0042  LIPASE 1692*   No results for input(s): AMMONIA in the last 168 hours. CBC:  Recent Labs Lab 01/23/15 0042  WBC 14.4*  NEUTROABS 13.5*  HGB 12.3  HCT 37.8  MCV 112.5*  PLT 144*   Cardiac  Enzymes: No results for input(s): CKTOTAL, CKMB, CKMBINDEX, TROPONINI in the last 168 hours.  BNP (last 3 results) No results for input(s): PROBNP in the last 8760 hours. CBG: No results for input(s): GLUCAP in the last 168 hours.  Radiological Exams on Admission: Ct Abdomen Pelvis Wo Contrast  01/23/2015   CLINICAL DATA:  Severe mid abdominal pain intermittent for a few days. Epigastric pain. Nausea. Decreased bowel movements. Oral contrast only per ordering physician.  EXAM: CT ABDOMEN AND PELVIS WITHOUT CONTRAST  TECHNIQUE: Multidetector CT imaging of the abdomen and pelvis was performed following the standard protocol without IV contrast.  COMPARISON:  CT chest abdomen and pelvis 06/03/2011  FINDINGS: Examination of solid organs and vascular structures is limited without IV contrast material. Evaluation of bowel structures is limited without oral contrast material.  Dependent atelectasis in the lung bases. Residual contrast material in the lower esophagus may indicate reflux or dysmotility.  Surgical absence of the gallbladder. The unenhanced appearance of the liver, spleen, adrenal glands, inferior vena cava, and retroperitoneal lymph nodes is unremarkable. Large exophytic lesions in both kidneys consistent with cysts as seen on previous study. No hydronephrosis in either kidney. 2 mm stone in the midportion left kidney. There is infiltration in the fat around the head of the pancreas and duodenal bulb region. Probable thickening of the duodenal wall. This could represent inflammation due to pancreatitis or duodenitis. No pancreatic ductal dilatation. Mild dilatation of the duodenum and proximal small bowel without transition zone. This is likely ileus related to the inflammatory process. No definite obstruction. No evidence of abscess or perforation. No free air in the abdomen. Given the wall thickening in the duodenum, recommend follow-up after resolution of acute process to exclude an underlying  duodenum neoplasm.  Pelvis: Small umbilical hernia containing fat. The appendix is normal. Evaluation of the low pelvis is limited due to streak artifact from bilateral hip arthroplasties. No  gross pelvic mass or lymphadenopathy. Visualized bladder wall is not thickened. No evidence of diverticulitis. Degenerative changes throughout the lumbar spine. No destructive bone lesions.  IMPRESSION: Inflammatory changes in the upper abdomen centered around the head of the pancreas and duodenum bulb region. This could be due to pancreatitis or inflammation from duodenitis or peptic ulcer disease. Mild dilatation of the duodenum probably represents reactive ileus. Followup after resolution of acute process recommended to exclude underlying duodenum neoplasm.   Electronically Signed   By: Lucienne Capers M.D.   On: 01/23/2015 02:17    EKG: Independently reviewed.  Assessment/Plan Principal Problem:   Acute pancreatitis Active Problems:   Essential hypertension   Atrial fibrillation   Stage III chronic kidney disease   Transaminitis   1. Acute pancreatitis - 1. Idiopathic 2. IVF 3. NPO 4. Pain control 1. Continue home fentanyl patch 2. Hold home PO norco and replace with PRN morphine, please use cautiously in this elderly patient 5. Monitor for fever (start ABx if patient develops fever),  2. Transaminates - 1. trend transaminases 2. No evidence of bile duct obstruction on CT scan 3. RA - continue home prednisone 4. HTN - continue home meds except diuretics 5. CKD stage 3 - kidney function appears to be at baseline, follow 6. A.Fib - continue eliquis, rate control with toprol    Code Status: DNR Family Communication: No family in room Disposition Plan: Admit to inpatient   Time spent: 57 min  GARDNER, JARED M. [facility] Pager [phone]  If 7AM-7PM, please contact the day team taking care of the patient [email or web address] Password [facility] 01/23/2015, 3:19 AM

## 2015-01-23 NOTE — Progress Notes (Signed)
ANTIBIOTIC CONSULT NOTE - INITIAL  Pharmacy Consult for Ceftriaxone Indication: UTI  Allergies  Allergen Reactions  . Sulfa Antibiotics Itching  . Tramadol Hcl Itching and Rash    Patient Measurements: Height: 5\' 1"  (154.9 cm) Weight: 162 lb 7.7 oz (73.7 kg) IBW/kg (Calculated) : 47.8  Vital Signs: Temp: 98 F (36.7 C) (09/23 2137) Temp Source: Oral (09/23 2137) BP: 123/58 mmHg (09/23 2137) Pulse Rate: 98 (09/23 2137) Intake/Output from previous day: 09/22 0701 - 09/23 0700 In: 181.3 [I.V.:181.3] Out: -  Intake/Output from this shift:    Labs:  Recent Labs  01/23/15 0042  WBC 14.4*  HGB 12.3  PLT 144*  CREATININE 1.15*   Estimated Creatinine Clearance: 29.9 mL/min (by C-G formula based on Cr of 1.15). No results for input(s): VANCOTROUGH, VANCOPEAK, VANCORANDOM, GENTTROUGH, GENTPEAK, GENTRANDOM, TOBRATROUGH, TOBRAPEAK, TOBRARND, AMIKACINPEAK, AMIKACINTROU, AMIKACIN in the last 72 hours.   Microbiology: No results found for this or any previous visit (from the past 720 hour(s)).  Medical History: Past Medical History  Diagnosis Date  . HEARING LOSS   . MITRAL VALVE INSUFF&AORTIC VALVE INSUFF     moderate MR  . PULMONARY HYPERTENSION   . Atrial fibrillation     permanent  . SICK SINUS SYNDROME     a. Tachybrady syndrome - Guidant PPM 10/2005.  . Edema 03/19/2009    due to venous insufficiency, chronic lymphedema  . URINARY INCONTINENCE   . Rheumatoid arthritis(714.0) dx clarified 2011    On prednisone  . BREAST CANCER 12/2006    ductal ca s/p L lumpectomy  . Diastolic dysfunction   . HYPERTENSION   . HYPERLIPIDEMIA   . GERD   . HYPOTHYROIDISM   . Hyperparathyroidism     2 lobes removed  . Venous insufficiency     chronic BLE edema  . Spinal stenosis   . Anemia     a. Macrocytic - normal B12, folate 08/2011. b. 3/6 heme positive stools 10/2011 - per PCP note, elected against colonscopy.  [name] TIA (transient ischemic attack) 05/2011  . Anxiety      Medications:  Scheduled:  . antiseptic oral rinse  7 mL Mouth Rinse q12n4p  . apixaban  2.5 mg Oral BID  . cefTRIAXone (ROCEPHIN)  IV  1 g Intravenous Q24H  . chlorhexidine  15 mL Mouth Rinse BID  . citalopram  20 mg Oral Daily  . conjugated estrogens  1 Applicatorful Vaginal Once per day on Mon Wed Fri  . fentaNYL  75 mcg Transdermal Q72H  . folic acid  161 mcg Oral q morning - 10a  . furosemide  20 mg Intravenous Daily  . gabapentin  800 mg Oral QHS  . isosorbide mononitrate  15 mg Oral Daily  . levothyroxine  50 mcg Oral Daily  . metoprolol succinate  50 mg Oral BID  . pantoprazole  40 mg Oral Daily  . polyvinyl alcohol  1 drop Both Eyes TID  . predniSONE  5 mg Oral Q breakfast   Infusions:  . sodium chloride 75 mL/hr at 01/23/15 1814   Assessment:  81 yr female admitted 9/23 AM with acute pancreatitis  H/O Rheumatoid arthritis, HTN, CKD, AFib  UA shows amber urine  Tmax = 100.1, WBC 14.4, CrCl ~ 30 ml/min  Blood and urine cultures ordered  Pharmacy consulted to dose Ceftriaxone for UTI  Goal of Therapy:  Eradication of infection  Plan:   Ceftriaxone 1gm IV q24h  No adjustment of dosage needed for Ceftriaxone; therefore pharmacy will  sign off.  Thank you, Poindexter, Toribio Harbour, PharmD 01/23/2015,10:55 PM

## 2015-01-23 NOTE — ED Provider Notes (Signed)
CSN: [id number]   Arrival date & time 01/22/15 2338  History  This chart was scribed for Carmin Muskrat, MD by Altamease Oiler, ED Scribe. This patient was seen in room WA05/WA05 and the patient's care was started at 12:06 AM.  Chief Complaint  Patient presents with  . Abdominal Pain    HPI The history is provided by the patient. No language interpreter was used.   Brought in by EMS from [facility], Nicole Roberts is a 81 y.o. female with PMHx of GERD who presents to the Emergency Department complaining of intermittent and severe mid-abdominal pain with onset a few days ago. The pain comes every 2 hours. Associated symptoms include nausea and decreased bowel movements. Pt denies vomiting.   Past Medical History  Diagnosis Date  . HEARING LOSS   . MITRAL VALVE INSUFF&AORTIC VALVE INSUFF     moderate MR  . PULMONARY HYPERTENSION   . Atrial fibrillation     permanent  . SICK SINUS SYNDROME     a. Tachybrady syndrome - [facility] PPM 10/2005.  . Edema 03/19/2009    due to venous insufficiency, chronic lymphedema  . URINARY INCONTINENCE   . Rheumatoid arthritis(714.0) dx clarified 2011    On prednisone  . BREAST CANCER 12/2006    ductal ca s/p L lumpectomy  . Diastolic dysfunction   . HYPERTENSION   . HYPERLIPIDEMIA   . GERD   . HYPOTHYROIDISM   . Hyperparathyroidism     2 lobes removed  . Venous insufficiency     chronic BLE edema  . Spinal stenosis   . Anemia     a. Macrocytic - normal B12, folate 08/2011. b. 3/6 heme positive stools 10/2011 - per PCP note, elected against colonscopy.  [name] TIA (transient ischemic attack) 05/2011  . Anxiety     Past Surgical History  Procedure Laterality Date  . Pacemaker placement  2005    SSS and syncope  . Cholecystectomy  04/06/99  . Abdominal hysterectomy  1970    Partial  . Left hip arthroscopy  1995  . Right hip arthroscopy  01/2001  . Carpal tunnel release  1998    bilateral  . Breast surgery  12/2006    Left breast  lupectomy- Ductal CA  . Left parathyroidectomy  07/2008  . Esophagogastroduodenoscopy N/A 10/18/2012    Procedure: ESOPHAGOGASTRODUODENOSCOPY (EGD);  Surgeon: Ladene Artist, MD;  Location: [facility];  Service: Endoscopy;  Laterality: N/A;    Family History  Problem Relation Age of Onset  . Ovarian cancer Mother   . Coronary artery disease Sister   . Coronary artery disease Brother   . Hypertension Mother     grandparent  . Lung cancer Father     Social History  Substance Use Topics  . Smoking status: Never Smoker   . Smokeless tobacco: Never Used  . Alcohol Use: No     Review of Systems  Constitutional:       Per HPI, otherwise negative  HENT:       Per HPI, otherwise negative  Respiratory:       Per HPI, otherwise negative  Cardiovascular:       Per HPI, otherwise negative  Endocrine:       Negative aside from HPI  Genitourinary:       Neg aside from HPI   Musculoskeletal:       Per HPI, otherwise negative  Skin: Negative.   Neurological: Negative for syncope.   Home Medications  Prior to Admission medications   Medication Sig Start Date End Date Taking? Authorizing Provider  alum & mag hydroxide-simeth (MAALOX/MYLANTA) 200-200-20 MG/5ML suspension Take 30 mLs by mouth every 6 (six) hours as needed for indigestion or heartburn.   Yes Historical Provider, MD  apixaban (ELIQUIS) 2.5 MG TABS tablet Take 1 tablet (2.5 mg total) by mouth 2 (two) times daily. 03/08/13  Yes Thompson Grayer, MD  Artificial Tear Ointment (ARTIFICIAL TEARS) ointment Place 1 drop into both eyes 3 (three) times daily.   Yes Historical Provider, MD  atorvastatin (LIPITOR) 10 MG tablet Take 10 mg by mouth at bedtime.   Yes Historical Provider, MD  calcium citrate-vitamin D (CITRACAL+D) 315-200 MG-UNIT per tablet Take 1 tablet by mouth 2 (two) times daily.   Yes Historical Provider, MD  cholecalciferol (VITAMIN D) 1000 UNITS tablet Take 1,000 Units by mouth daily.   Yes Historical Provider, MD   citalopram (CELEXA) 20 MG tablet Take 20 mg by mouth daily.   Yes Historical Provider, MD  conjugated estrogens (PREMARIN) vaginal cream Place 1 Applicatorful vaginally 3 (three) times a week.   Yes Historical Provider, MD  fentaNYL (DURAGESIC - DOSED MCG/HR) 75 MCG/HR Place 1 patch (75 mcg total) onto the skin every 3 (three) days. 12/29/14  Yes Olga Millers, MD  folic acid (FOLVITE) 496 MCG tablet Take 400 mcg by mouth every morning.   Yes Historical Provider, MD  furosemide (LASIX) 20 MG tablet Take 1 tablet (20 mg total) by mouth daily. 07/10/14  Yes Rowe Clack, MD  gabapentin (NEURONTIN) 400 MG capsule Take 2 capsules (800 mg total) by mouth at bedtime. 12/02/14  Yes Rowe Clack, MD  HYDROcodone-acetaminophen (NORCO/VICODIN) 5-325 MG per tablet Take 1 tablet by mouth every 6 (six) hours as needed for moderate pain.   Yes Historical Provider, MD  isosorbide mononitrate (IMDUR) 30 MG 24 hr tablet Take 15 mg by mouth daily.   Yes Historical Provider, MD  levothyroxine (SYNTHROID, LEVOTHROID) 50 MCG tablet Take 1 tablet (50 mcg total) by mouth daily. 06/09/14  Yes Rowe Clack, MD  metoprolol succinate (TOPROL-XL) 50 MG 24 hr tablet Take 1 tablet (50 mg total) by mouth 2 (two) times daily. Take with or immediately following a meal. 06/02/14  Yes Rowe Clack, MD  Multiple Vitamin (MULTIVITAMIN) LIQD Take 5 mLs by mouth daily.   Yes Historical Provider, MD  nitroGLYCERIN (NITROSTAT) 0.4 MG SL tablet Place 0.4 mg under the tongue every 5 (five) minutes x 3 doses as needed for chest pain. 03/20/12  Yes Roger A Arguello, PA-C  ondansetron (ZOFRAN) 4 MG tablet Take 1 tablet (4 mg total) by mouth every 12 (twelve) hours as needed for nausea. For nausea 02/25/13  Yes Rowe Clack, MD  pantoprazole (PROTONIX) 40 MG tablet Take 40 mg by mouth daily.   Yes Historical Provider, MD  potassium chloride (K-DUR) 10 MEQ tablet TAKE ONE TABLET BY MOUTH ONCE DAILY. 12/20/13  Yes Rowe Clack, MD  predniSONE (DELTASONE) 5 MG tablet Take 5 mg by mouth daily with breakfast.   Yes Historical Provider, MD  sucralfate (CARAFATE) 1 GM/10ML suspension Take 1 g by mouth 2 (two) times daily.   Yes Historical Provider, MD  vitamin B-12 (CYANOCOBALAMIN) 500 MCG tablet TAKE ONE TABLET BY MOUTH ONCE DAILY. 11/20/13  Yes Rowe Clack, MD  zoster vaccine live, PF, (ZOSTAVAX) 75916 UNT/0.65ML injection Inject 19,400 Units into the skin once. 12/02/14   Rowe Clack, MD  Allergies  Sulfa antibiotics and Tramadol hcl  Triage Vitals: BP 139/90 mmHg  Pulse 95  Temp(Src) 99.8 F (37.7 C) (Oral)  Resp 18  SpO2 96%  Physical Exam  Constitutional: She is oriented to person, place, and time. She appears well-developed and well-nourished. No distress.  HENT:  Head: Normocephalic and atraumatic.  Eyes: Conjunctivae and EOM are normal.  Cardiovascular: Normal rate.   Pulmonary/Chest: No stridor. No respiratory distress.  Abdominal: She exhibits no distension. There is tenderness. There is guarding.  Periumbilical tenderness  Musculoskeletal: She exhibits no edema.  Neurological: She is alert and oriented to person, place, and time. No cranial nerve deficit.  Skin: Skin is warm and dry.  Psychiatric: She has a normal mood and affect.  Nursing note and vitals reviewed.   ED Course  Procedures  COORDINATION OF CARE: 12:11 AM Discussed treatment plan which includes CT A/P without contrast, lab work, Zofran, and IVF with pt at bedside and pt agreed to plan.  Labs Reviewed  CBC WITH DIFFERENTIAL/PLATELET - Abnormal; Notable for the following:    WBC 14.4 (*)    RBC 3.36 (*)    MCV 112.5 (*)    MCH 36.6 (*)    RDW 16.2 (*)    Platelets 144 (*)    Neutro Abs 13.5 (*)    Lymphs Abs 0.6 (*)    All other components within normal limits  COMPREHENSIVE METABOLIC PANEL - Abnormal; Notable for the following:    Potassium 3.4 (*)    BUN 25 (*)    Creatinine, Ser 1.15 (*)     AST 813 (*)    ALT 461 (*)    Alkaline Phosphatase 214 (*)    Total Bilirubin 2.8 (*)    GFR calc non Af Amer 41 (*)    GFR calc Af Amer 47 (*)    All other components within normal limits  LIPASE, BLOOD - Abnormal; Notable for the following:    Lipase 1692 (*)    All other components within normal limits  I-STAT CREATININE, ED    Imaging Review Ct Abdomen Pelvis Wo Contrast  01/23/2015   CLINICAL DATA:  Severe mid abdominal pain intermittent for a few days. Epigastric pain. Nausea. Decreased bowel movements. Oral contrast only per ordering physician.  EXAM: CT ABDOMEN AND PELVIS WITHOUT CONTRAST  TECHNIQUE: Multidetector CT imaging of the abdomen and pelvis was performed following the standard protocol without IV contrast.  COMPARISON:  CT chest abdomen and pelvis 06/03/2011  FINDINGS: Examination of solid organs and vascular structures is limited without IV contrast material. Evaluation of bowel structures is limited without oral contrast material.  Dependent atelectasis in the lung bases. Residual contrast material in the lower esophagus may indicate reflux or dysmotility.  Surgical absence of the gallbladder. The unenhanced appearance of the liver, spleen, adrenal glands, inferior vena cava, and retroperitoneal lymph nodes is unremarkable. Large exophytic lesions in both kidneys consistent with cysts as seen on previous study. No hydronephrosis in either kidney. 2 mm stone in the midportion left kidney. There is infiltration in the fat around the head of the pancreas and duodenal bulb region. Probable thickening of the duodenal wall. This could represent inflammation due to pancreatitis or duodenitis. No pancreatic ductal dilatation. Mild dilatation of the duodenum and proximal small bowel without transition zone. This is likely ileus related to the inflammatory process. No definite obstruction. No evidence of abscess or perforation. No free air in the abdomen. Given the wall thickening in the  duodenum, recommend follow-up after resolution of acute process to exclude an underlying duodenum neoplasm.  Pelvis: Small umbilical hernia containing fat. The appendix is normal. Evaluation of the low pelvis is limited due to streak artifact from bilateral hip arthroplasties. No gross pelvic mass or lymphadenopathy. Visualized bladder wall is not thickened. No evidence of diverticulitis. Degenerative changes throughout the lumbar spine. No destructive bone lesions.  IMPRESSION: Inflammatory changes in the upper abdomen centered around the head of the pancreas and duodenum bulb region. This could be due to pancreatitis or inflammation from duodenitis or peptic ulcer disease. Mild dilatation of the duodenum probably represents reactive ileus. Followup after resolution of acute process recommended to exclude underlying duodenum neoplasm.   Electronically Signed   By: Lucienne Capers M.D.   On: 01/23/2015 02:17     MDM    I personally performed the services described in this documentation, which was scribed in my presence. The recorded information has been reviewed and is accurate.   Elderly female presents with new abdominal pain, nausea. Patient is tender abdomen, primarily in the peri-umbilical and upper area. Patient is afebrile, but tachycardic. Findings evaluation and management.  Carmin Muskrat, MD 01/23/15 [phone]

## 2015-01-23 NOTE — Progress Notes (Signed)
Patient admitted after midnight. Please refer to admission note done 01/23/2015 for details on assessment and plan. In short, patient admitted with epigastric pain, nausea but no reports of vomiting. She is from assisted-living facility. On admission, she was found to have acute pancreatitis although there is a possibility of duodenitis or peptic ulcer disease versus reactive ileus. Her lipase level was elevated at 1692 and her liver function enzymes were as follows, AST 813, ALT 461, ALP 214, total bilirubin 2.8.  Assessment and plan:  Principal Problem:   Acute pancreatitis / Abnormal LFTs (liver function tests) /  Abdominal pain, epigastric - Patient found to have acute pancreatitis and CT abdomen. In addition her lipase level was 1692 on the admission. Her liver function enzymes were significant for AST of 813, ALT 461, ALP 214, total bilirubin 2.8 - We will obtain abdominal ultrasound for further evaluation to rule out possible obstruction. - Continue to trend liver function enzymes. - Continue nothing by mouth for now. - Continue supportive care with IV fluids, analgesia and antiemetics as needed.  Active Problems:   Hypothyroidism - Resume Synthroid    Dyslipidemia - Will hold statin therapy until liver function enzymes normalize.    Essential hypertension - Continue lisinopril.    Rheumatoid arthritis - Patient on low-dose prednisone.    Atrial fibrillation on chronic anticoagulation  - CHADS vasc score at least 4 (age, HTN, CHF) - On anticoagulation with Eliquis - Rate controlled with metoprolol     Stage III chronic kidney disease - Baseline creatinine 1.86 (12/20/2012) - Creatinine on this admission 1.15, stable     Chronic combined, systolic and diastolic congestive heart failure - Last 2-D echo in 2014 showed ejection fraction of 45% - Resume Lasix, IV until able to tolerate by mouth intake - Continue indoor, metoprolol - Continue Eliquis    Depression  -  Continue Celexa.     Hypokalemia - Likely secondary to GI losses, nausea. - Supplemented    Leukocytosis - Unclear etiology. She did spike fever since the admission, T max 100.8 F - Obtain blood cultures, chest x-ray and urinalysis with urine culture.  Leisa Lenz [facility] [phone]

## 2015-01-23 NOTE — ED Notes (Signed)
Patient transported to CT 

## 2015-01-24 ENCOUNTER — Inpatient Hospital Stay (HOSPITAL_COMMUNITY): Payer: Medicare Other

## 2015-01-24 DIAGNOSIS — N390 Urinary tract infection, site not specified: Secondary | ICD-10-CM | POA: Diagnosis present

## 2015-01-24 DIAGNOSIS — A4150 Gram-negative sepsis, unspecified: Secondary | ICD-10-CM | POA: Diagnosis present

## 2015-01-24 DIAGNOSIS — D72829 Elevated white blood cell count, unspecified: Secondary | ICD-10-CM

## 2015-01-24 DIAGNOSIS — R7881 Bacteremia: Secondary | ICD-10-CM | POA: Diagnosis present

## 2015-01-24 LAB — LACTIC ACID, PLASMA
Lactic Acid, Venous: 1.4 mmol/L (ref 0.5–2.0)
Lactic Acid, Venous: 1.7 mmol/L (ref 0.5–2.0)

## 2015-01-24 LAB — PROCALCITONIN: Procalcitonin: 21.54 ng/mL

## 2015-01-24 MED ORDER — PIPERACILLIN-TAZOBACTAM 3.375 G IVPB 30 MIN
3.3750 g | INTRAVENOUS | Status: AC
  Administered 2015-01-24: 3.375 g via INTRAVENOUS
  Filled 2015-01-24: qty 50

## 2015-01-24 MED ORDER — FUROSEMIDE 20 MG PO TABS
20.0000 mg | ORAL_TABLET | Freq: Every day | ORAL | Status: DC
  Administered 2015-01-24 – 2015-01-29 (×6): 20 mg via ORAL
  Filled 2015-01-24 (×7): qty 1

## 2015-01-24 MED ORDER — PIPERACILLIN-TAZOBACTAM 3.375 G IVPB
3.3750 g | Freq: Three times a day (TID) | INTRAVENOUS | Status: DC
  Administered 2015-01-24 – 2015-01-26 (×5): 3.375 g via INTRAVENOUS
  Filled 2015-01-24 (×6): qty 50

## 2015-01-24 MED ORDER — LIP MEDEX EX OINT
TOPICAL_OINTMENT | CUTANEOUS | Status: AC
  Administered 2015-01-24: 1
  Filled 2015-01-24: qty 7

## 2015-01-24 NOTE — Progress Notes (Signed)
Patient ID: Nicole Roberts, female   DOB: [birth year], 81 y.o.   MRN: [id number] [facility] PROGRESS NOTE  Nicole Roberts JAS:[id number] DOB: Apr 06, 1922 DOA: 01/22/2015 PCP: Gwendolyn Grant, MD  Brief narrative:    81 year old female with past medical history of hypertension, atrial fibrillation (on [facility] with apixaban), DLD, hypothyroidism, dementia who presented with epigastric pain and nausea but no reports of vomiting. She is from assisted-living facility.  On admission, she was found to have acute pancreatitis although there is a possibility of duodenitis or peptic ulcer disease versus reactive ileus. Her lipase level was elevated at 1692 and her liver function enzymes were as follows, AST 813, ALT 461, ALP 214, total bilirubin 2.8.  Hospital course complicated with finding of gram negative rod bacteremia.   Anticipated discharge: now has bacteremia which needs repeat blood culture and broadening abx coverage. To ALF or SNF depending on PT eval once acute issues resolve, likely by 01/26/2015.  Assessment/Plan:    Principal Problem:   Acute pancreatitis / Abnormal LFTs (liver function tests) /  Abdominal pain, epigastric - CT abdomen on admission suggestive of acute pancreatitis - Lipase level was 1692 on the admission, trended down to 720 today  - Abdominal US is pending - Repeat LFT's tomorrow - She is NPO now and we are awaiting SLP evaluation  - Continue supportive care with IV fluids, analgesia and antiemetics as needed. - May use K pad for abdominal pain   Active Problems:   Sepsis secondary to Gram negative bacteremia / Leukocytosis / UTI - Noted that pt has fever 9/23 (100.76F). She also had leukocytosis of 14.4 - Placed sepsis order set - Blood cultures showed gram negative rods, final report pending - UA showed moderate leukocytes and many bacteria; follow up urine culture results  - Lactic acid and procalcitonin are pending - Given empiric rocephin 9/23 but now due  to bacteremia we broadened coverage to zosyn    Hypothyroidism - Continue Synthroid    Dyslipidemia - Hold statin therapy until liver function enzymes normalize.    Essential hypertension - Continue lisinopril, imdur, metprolol     Rheumatoid arthritis - Continue low-dose prednisone.    Atrial fibrillation on chronic anticoagulation  - CHADS vasc score at least 4 (age, HTN, CHF) - Continue anticoagulation with Eliquis - Continue metoprolol for rate control     Stage III chronic kidney disease - Baseline creatinine 1.86 (12/20/2012) - Creatinine 1.15 on this admission, stable     Chronic combined, systolic and diastolic congestive heart failure - 2-D echo in 2014 showed ejection fraction of 45% - Continue lasix - We will continue imdur, metoprolol - We will continue Eliquis    Depression  - On Celexa.  - Stable     Hypokalemia - Likely secondary to GI losses, nausea. - Repleted    DVT Prophylaxis  - On full dose AC with apixaban   Code Status: DNR/DNI Family Communication:  plan of care discussed with the patient's son over the phone 9/23. Disposition Plan: needs Pt eval for safe discharge plan.    IV access:  Peripheral IV  Procedures and diagnostic studies:    Ct Abdomen Pelvis Wo Contrast 01/23/2015   Inflammatory changes in the upper abdomen centered around the head of the pancreas and duodenum bulb region. This could be due to pancreatitis or inflammation from duodenitis or peptic ulcer disease. Mild dilatation of the duodenum probably represents reactive ileus. Followup after resolution of acute process recommended to exclude underlying duodenum  neoplasm.   Electronically Signed   By: Lucienne Capers M.D.   On: 01/23/2015 02:17   Dg Chest Port 1 View 01/23/2015    Cardiomegaly with early interstitial edema pattern.   Electronically Signed   By: Jerilynn Mages.  Shick M.D.   On: 01/23/2015 10:55   Medical Consultants:  None   Other Consultants:   PT Nutrition  IAnti-Infectives:   Rocehin 01/24/2015 --> 01/24/2015 Zosyn 01/24/2015 -->    Leisa Lenz, MD  [facility] Pager [phone]  Time spent in minutes: 25 minutes  If 7PM-7AM, please contact night-coverage [email or web address] Password [facility] 01/24/2015, 9:55 AM   LOS: 1 day    HPI/Subjective: No acute overnight events. No respiratory distress. No vomiting.   Objective: Filed Vitals:   01/23/15 1350 01/23/15 1840 01/23/15 2137 01/24/15 0543  BP: 97/51  123/58 117/57  Pulse: 100  98 95  Temp: 98.7 F (37.1 C)  98 F (36.7 C) 98.5 F (36.9 C)  TempSrc: Oral  Oral Oral  Resp: 18  18 18   Height:  5\' 1"  (1.549 m)    Weight:  73.7 kg (162 lb 7.7 oz)    SpO2: 95%  97% 97%    Intake/Output Summary (Last 24 hours) at 01/24/15 0955 Last data filed at 01/24/15 0800  Gross per 24 hour  Intake     50 ml  Output    650 ml  Net   -600 ml    Exam:   General:  Pt is not in acute distress  Cardiovascular: Rate controlled, S1/S2  Respiratory: Clear to auscultation bilaterally, no wheezing, no crackles, no rhonchi  Abdomen: Soft, non tender, non distended, bowel sounds present  Extremities: No edema, pulses DP and PT palpable bilaterally  Neuro: Grossly nonfocal  Data Reviewed: Basic Metabolic Panel:  Recent Labs Lab 01/23/15 0042  NA 141  K 3.4*  CL 106  CO2 26  GLUCOSE 78  BUN 25*  CREATININE 1.15*  CALCIUM 8.9   Liver Function Tests:  Recent Labs Lab 01/23/15 0042  AST 813*  ALT 461*  ALKPHOS 214*  BILITOT 2.8*  PROT 6.7  ALBUMIN 3.6    Recent Labs Lab 01/23/15 0042 01/23/15 0900  LIPASE 1692* 720*   No results for input(s): AMMONIA in the last 168 hours. CBC:  Recent Labs Lab 01/23/15 0042  WBC 14.4*  NEUTROABS 13.5*  HGB 12.3  HCT 37.8  MCV 112.5*  PLT 144*   Cardiac Enzymes: No results for input(s): CKTOTAL, CKMB, CKMBINDEX, TROPONINI in the last 168 hours. BNP: Invalid input(s): POCBNP CBG: No results for  input(s): GLUCAP in the last 168 hours.  Recent Results (from the past 240 hour(s))  Culture, blood (routine x 2)     Status: None (Preliminary result)   Collection Time: 01/23/15 10:21 AM  Result Value Ref Range Status   Specimen Description BLOOD RIGHT HAND  Final   Special Requests BOTTLES DRAWN AEROBIC AND ANAEROBIC 10CC  Final   Culture  Setup Time   Final    GRAM NEGATIVE RODS IN BOTH AEROBIC AND ANAEROBIC BOTTLES CRITICAL RESULT CALLED TO, READ BACK BY AND VERIFIED WITH: Eston Esters RN [id number] 01/23/15 A BROWNING    Culture   Final    ESCHERICHIA COLI Performed at [facility]    Report Status PENDING  Incomplete  Culture, blood (routine x 2)     Status: None (Preliminary result)   Collection Time: 01/23/15 10:28 AM  Result Value Ref Range Status   Specimen  Description BLOOD LEFT HAND  Final   Special Requests BOTTLES DRAWN AEROBIC AND ANAEROBIC 10CC  Final   Culture  Setup Time   Final    GRAM NEGATIVE RODS IN BOTH AEROBIC AND ANAEROBIC BOTTLES CRITICAL RESULT CALLED TO, READ BACK BY AND VERIFIED WITH: Eston Esters RN [id number] 01/23/15 A BROWNING    Culture   Final    GRAM NEGATIVE RODS Performed at [facility]    Report Status PENDING  Incomplete     Scheduled Meds: . antiseptic oral rinse  7 mL Mouth Rinse q12n4p  . apixaban  2.5 mg Oral BID  . chlorhexidine  15 mL Mouth Rinse BID  . citalopram  20 mg Oral Daily  . conjugated estrogens  1 Applicatorful Vaginal Once per day on Mon Wed Fri  . fentaNYL  75 mcg Transdermal Q72H  . folic acid  469 mcg Oral q morning - 10a  . furosemide  20 mg Intravenous Daily  . gabapentin  800 mg Oral QHS  . isosorbide mononitrate  15 mg Oral Daily  . levothyroxine  50 mcg Oral Daily  . metoprolol succinate  50 mg Oral BID  . pantoprazole  40 mg Oral Daily  . polyvinyl alcohol  1 drop Both Eyes TID  . predniSONE  5 mg Oral Q breakfast   Continuous Infusions: . sodium chloride 75 mL/hr at 01/24/15 [phone]

## 2015-01-24 NOTE — Progress Notes (Addendum)
Patient admitted after midnight. Please refer to admission note done 01/23/2015 for details on assessment and plan. In short, patient admitted with epigastric pain, nausea but no reports of vomiting. She is from assisted-living facility. On admission, she was found to have acute pancreatitis although there is a possibility of duodenitis or peptic ulcer disease versus reactive ileus. Her lipase level was elevated at 1692 and her liver function enzymes were as follows, AST 813, ALT 461, ALP 214, total bilirubin 2.8.  Assessment and plan:  Principal Problem:   Acute pancreatitis / Abnormal LFTs (liver function tests) /  Abdominal pain, epigastric - Patient found to have acute pancreatitis on CT abdomen. In addition her lipase level was 1692 on the admission. Her liver function enzymes were significant for AST of 813, ALT 461, ALP 214, total bilirubin 2.8 - We will obtain abdominal ultrasound for further evaluation to rule out possible obstruction. - Continue to trend liver function enzymes. - Continue nothing by mouth for now. - Continue supportive care with IV fluids, analgesia and antiemetics as needed.  Active Problems:   Hypothyroidism - Resume Synthroid    Dyslipidemia - Will hold statin therapy until liver function enzymes normalize.    Essential hypertension - Continue lisinopril.    Rheumatoid arthritis - Patient on low-dose prednisone.    Atrial fibrillation on chronic anticoagulation  - CHADS vasc score at least 4 (age, HTN, CHF) - On anticoagulation with Eliquis - Rate controlled with metoprolol     Stage III chronic kidney disease - Baseline creatinine 1.86 (12/20/2012) - Creatinine on this admission 1.15, stable     Chronic combined, systolic and diastolic congestive heart failure - Last 2-D echo in 2014 showed ejection fraction of 45% - Resume Lasix, IV until able to tolerate by mouth intake - Continue imdur, metoprolol - Continue Eliquis    Depression  - Continue  Celexa.     Hypokalemia - Likely secondary to GI losses, nausea. - Supplemented    Leukocytosis - Unclear etiology. She did spike fever since the admission, T max 100.8 F - Obtain blood cultures, chest x-ray and urinalysis with urine culture.  Leisa Lenz [facility] [phone]

## 2015-01-24 NOTE — Evaluation (Signed)
Clinical/Bedside Swallow Evaluation Patient Details  Name: Nicole Roberts MRN: [id number] Date of Birth: 09/15/1921  Today's Date: 01/24/2015 Time: SLP Start Time (ACUTE ONLY): 1022 SLP Stop Time (ACUTE ONLY): 1040 SLP Time Calculation (min) (ACUTE ONLY): 18 min  Past Medical History:  Past Medical History  Diagnosis Date  . HEARING LOSS   . MITRAL VALVE INSUFF&AORTIC VALVE INSUFF     moderate MR  . PULMONARY HYPERTENSION   . Atrial fibrillation     permanent  . SICK SINUS SYNDROME     a. Tachybrady syndrome - [facility]ant PPM 10/2005.  . Edema 03/19/2009    due to venous insufficiency, chronic lymphedema  . URINARY INCONTINENCE   . Rheumatoid arthritis(714.0) dx clarified 2011    On prednisone  . BREAST CANCER 12/2006    ductal ca s/p L lumpectomy  . Diastolic dysfunction   . HYPERTENSION   . HYPERLIPIDEMIA   . GERD   . HYPOTHYROIDISM   . Hyperparathyroidism     2 lobes removed  . Venous insufficiency     chronic BLE edema  . Spinal stenosis   . Anemia     a. Macrocytic - normal B12, folate 08/2011. b. 3/6 heme positive stools 10/2011 - per PCP note, elected against colonscopy.  [name] TIA (transient ischemic attack) 05/2011  . Anxiety    Past Surgical History:  Past Surgical History  Procedure Laterality Date  . Pacemaker placement  2005    SSS and syncope  . Cholecystectomy  04/06/99  . Abdominal hysterectomy  1970    Partial  . Left hip arthroscopy  1995  . Right hip arthroscopy  01/2001  . Carpal tunnel release  1998    bilateral  . Breast surgery  12/2006    Left breast lupectomy- Ductal CA  . Left parathyroidectomy  07/2008  . Esophagogastroduodenoscopy N/A 10/18/2012    Procedure: ESOPHAGOGASTRODUODENOSCOPY (EGD);  Surgeon: Ladene Artist, MD;  Location: [facility];  Service: Endoscopy;  Laterality: N/A;   HPI:  81 y.o. female PMH: hearing loss, mitral valve insuffieciency, A-fib, breast cancer, HTN, spinal stenosis, TIA who presents to the ED with abdominal  pain.Found to have acute pancreatitis. CXR cardiomegaly with early interstitial edema pattern. Cervical spine CT 2014 advanced multilevel degenerative disc disease and cervical spondylosis. EGD 09/2012 revealed white exudates consistent with candidiasis, tortuous esophagus, mildly dilated proximal esophagus, mild prepyloric gastritis.If symptoms don't resolve with Diflucan consider evaluation for possible esophageal motility disorder and gastroparesis. No prior ST notes.   Assessment / Plan / Recommendation Clinical Impression  Pt denied prior dysphagia. Redness seen around eyes; no CXR performed this admission. Trials thin, puree and solid did not reveal indications of airway compromise at this time. History of tortuous esophagus and mildly dilated proximal esophagus on EGD 2014. From a swallow standpoint SLP recommends regular texture and thin liquids (defer diet to MD given pancreatitis), pills with thin liquid, stay sitting upright minimum 45 minutes after eating, alternate liquids and solids. Will follow up briefly.       Aspiration Risk  Mild    Diet Recommendation Age appropriate regular solids;Thin   Medication Administration: Whole meds with liquid Compensations: Slow rate;Small sips/bites    Other  Recommendations Oral Care Recommendations: Oral care BID   Follow Up Recommendations       Frequency and Duration min 1 x/week  1 week   Pertinent Vitals/Pain none    SLP Swallow Goals     Swallow Study Prior Functional Status  General Other Pertinent Information: 81 y.o. female PMH: hearing loss, mitral valve insuffieciency, A-fib, breast cancer, HTN, spinal stenosis, TIA who presents to the ED with abdominal pain.Found to have acute pancreatitis. CXR cardiomegaly with early interstitial edema pattern. Cervical spine CT 2014 advanced multilevel degenerative disc disease and cervical spondylosis. EGD 09/2012 revealed white exudates consistent with candidiasis, tortuous  esophagus, mildly dilated proximal esophagus, mild prepyloric gastritis.If symptoms don't resolve with Diflucan consider evaluation for possible esophageal motility disorder and gastroparesis. No prior ST notes. Type of Study: Bedside swallow evaluation Previous Swallow Assessment:  (none found) Diet Prior to this Study: NPO Temperature Spikes Noted: No Respiratory Status: Room air History of Recent Intubation: No Behavior/Cognition: Alert;Cooperative;Pleasant mood (HOH) Oral Cavity - Dentition:  (missing posterior, partial at ALF) Self-Feeding Abilities: Able to feed self Patient Positioning: Upright in bed Baseline Vocal Quality: Normal Volitional Cough: Strong Volitional Swallow: Able to elicit    Oral/Motor/Sensory Function Overall Oral Motor/Sensory Function: Appears within functional limits for tasks assessed   Ice Chips Ice chips: Not tested   Thin Liquid Thin Liquid: Within functional limits Presentation: Cup;Straw    Nectar Thick Nectar Thick Liquid: Not tested   Honey Thick Honey Thick Liquid: Not tested   Puree Puree: Within functional limits   Solid   GO    Solid: Within functional limits       Houston Siren 01/24/2015,11:05 AM  Orbie Pyo [place]Ed [employer] [phone]

## 2015-01-24 NOTE — Progress Notes (Signed)
ANTIBIOTIC CONSULT NOTE - INITIAL  Pharmacy Consult for Zosyn Indication: GNR bacteremia  Allergies  Allergen Reactions  . Sulfa Antibiotics Itching  . Tramadol Hcl Itching and Rash    Patient Measurements: Height: 5\' 1"  (154.9 cm) Weight: 162 lb 7.7 oz (73.7 kg) IBW/kg (Calculated) : 47.8   Vital Signs: Temp: 98.5 F (36.9 C) (09/24 0543) Temp Source: Oral (09/24 0543) BP: 117/57 mmHg (09/24 0543) Pulse Rate: 95 (09/24 0543) Intake/Output from previous day: 09/23 0701 - 09/24 0700 In: 0  Out: 650 [Urine:650] Intake/Output from this shift: Total I/O In: 50 [IV Piggyback:50] Out: -   Labs:  Recent Labs  01/23/15 0042  WBC 14.4*  HGB 12.3  PLT 144*  CREATININE 1.15*   Estimated Creatinine Clearance: 29.9 mL/min (by C-G formula based on Cr of 1.15). No results for input(s): VANCOTROUGH, VANCOPEAK, VANCORANDOM, GENTTROUGH, GENTPEAK, GENTRANDOM, TOBRATROUGH, TOBRAPEAK, TOBRARND, AMIKACINPEAK, AMIKACINTROU, AMIKACIN in the last 72 hours.   Microbiology: Recent Results (from the past 720 hour(s))  Culture, blood (routine x 2)     Status: None (Preliminary result)   Collection Time: 01/23/15 10:21 AM  Result Value Ref Range Status   Specimen Description BLOOD RIGHT HAND  Final   Special Requests BOTTLES DRAWN AEROBIC AND ANAEROBIC 10CC  Final   Culture  Setup Time   Final    GRAM NEGATIVE RODS IN BOTH AEROBIC AND ANAEROBIC BOTTLES CRITICAL RESULT CALLED TO, READ BACK BY AND VERIFIED WITH: Eston Esters RN [id number] 01/23/15 A BROWNING    Culture   Final    ESCHERICHIA COLI Performed at [facility]    Report Status PENDING  Incomplete  Culture, blood (routine x 2)     Status: None (Preliminary result)   Collection Time: 01/23/15 10:28 AM  Result Value Ref Range Status   Specimen Description BLOOD LEFT HAND  Final   Special Requests BOTTLES DRAWN AEROBIC AND ANAEROBIC 10CC  Final   Culture  Setup Time   Final    GRAM NEGATIVE RODS IN BOTH AEROBIC AND  ANAEROBIC BOTTLES CRITICAL RESULT CALLED TO, READ BACK BY AND VERIFIED WITH: Eston Esters RN [id number] 01/23/15 A BROWNING    Culture   Final    GRAM NEGATIVE RODS Performed at [facility]    Report Status PENDING  Incomplete    Medical History: Past Medical History  Diagnosis Date  . HEARING LOSS   . MITRAL VALVE INSUFF&AORTIC VALVE INSUFF     moderate MR  . PULMONARY HYPERTENSION   . Atrial fibrillation     permanent  . SICK SINUS SYNDROME     a. Tachybrady syndrome - [facility] PPM 10/2005.  . Edema 03/19/2009    due to venous insufficiency, chronic lymphedema  . URINARY INCONTINENCE   . Rheumatoid arthritis(714.0) dx clarified 2011    On prednisone  . BREAST CANCER 12/2006    ductal ca s/p L lumpectomy  . Diastolic dysfunction   . HYPERTENSION   . HYPERLIPIDEMIA   . GERD   . HYPOTHYROIDISM   . Hyperparathyroidism     2 lobes removed  . Venous insufficiency     chronic BLE edema  . Spinal stenosis   . Anemia     a. Macrocytic - normal B12, folate 08/2011. b. 3/6 heme positive stools 10/2011 - per PCP note, elected against colonscopy.  [name] TIA (transient ischemic attack) 05/2011  . Anxiety     Medications:  Scheduled:  . antiseptic oral rinse  7 mL Mouth Rinse  q12n4p  . apixaban  2.5 mg Oral BID  . chlorhexidine  15 mL Mouth Rinse BID  . citalopram  20 mg Oral Daily  . conjugated estrogens  1 Applicatorful Vaginal Once per day on Mon Wed Fri  . fentaNYL  75 mcg Transdermal Q72H  . folic acid  629 mcg Oral q morning - 10a  . furosemide  20 mg Intravenous Daily  . gabapentin  800 mg Oral QHS  . isosorbide mononitrate  15 mg Oral Daily  . levothyroxine  50 mcg Oral Daily  . metoprolol succinate  50 mg Oral BID  . pantoprazole  40 mg Oral Daily  . polyvinyl alcohol  1 drop Both Eyes TID  . predniSONE  5 mg Oral Q breakfast   Infusions:  . sodium chloride 75 mL/hr at 01/24/15 0758   PRN: morphine injection Assessment: 89yoF admitted from assisted-living facility  with epigastric pain, nausea and was found to have acute pancreatitis vs duodenitis or PUD. She was started on Rocephin for UTI. Blood CX x 2 growing GNR with 1/2 identified as E. Coli. Rocephin is d/ced and switching to Zosyn.  Goal of Therapy:  Eradication of infection  Plan:  Will start Zosyn 3.375Gm IV Q8h (4 hour infusion). Will f/u Scr and adjust dose as needed. Will f/u Blood and Urine Cx.  Garnet Sierras 01/24/2015,10:03 AM

## 2015-01-24 NOTE — Progress Notes (Signed)
Speech Language Pathology  Patient Details Name: Nicole Roberts MRN: [id number] DOB: 1921-10-10 Today's Date: 01/24/2015 Time: 5146-       Pt NPO for abdominal ultrasound to rule out obstruction. RN states test may be in the next hour and 1/2. RN to page this SLP.   Orbie Pyo [place] [phone]

## 2015-01-25 DIAGNOSIS — E440 Moderate protein-calorie malnutrition: Secondary | ICD-10-CM | POA: Diagnosis present

## 2015-01-25 DIAGNOSIS — Z993 Dependence on wheelchair: Secondary | ICD-10-CM

## 2015-01-25 DIAGNOSIS — B001 Herpesviral vesicular dermatitis: Secondary | ICD-10-CM | POA: Diagnosis present

## 2015-01-25 DIAGNOSIS — A4151 Sepsis due to Escherichia coli [E. coli]: Secondary | ICD-10-CM | POA: Diagnosis present

## 2015-01-25 DIAGNOSIS — G9340 Encephalopathy, unspecified: Secondary | ICD-10-CM | POA: Diagnosis present

## 2015-01-25 DIAGNOSIS — D696 Thrombocytopenia, unspecified: Secondary | ICD-10-CM | POA: Diagnosis present

## 2015-01-25 DIAGNOSIS — I5042 Chronic combined systolic (congestive) and diastolic (congestive) heart failure: Secondary | ICD-10-CM

## 2015-01-25 DIAGNOSIS — R7881 Bacteremia: Secondary | ICD-10-CM | POA: Diagnosis present

## 2015-01-25 LAB — CULTURE, BLOOD (ROUTINE X 2)

## 2015-01-25 MED ORDER — BOOST / RESOURCE BREEZE PO LIQD
1.0000 | Freq: Three times a day (TID) | ORAL | Status: DC
  Administered 2015-01-25 – 2015-01-26 (×3): 1 via ORAL

## 2015-01-25 MED ORDER — ACYCLOVIR 400 MG PO TABS
400.0000 mg | ORAL_TABLET | Freq: Every day | ORAL | Status: DC
Start: 2015-01-25 — End: 2015-01-30
  Administered 2015-01-25 – 2015-01-30 (×21): 400 mg via ORAL
  Filled 2015-01-25 (×29): qty 1

## 2015-01-25 NOTE — Progress Notes (Signed)
Initial Nutrition Assessment  DOCUMENTATION CODES:   Obesity unspecified  INTERVENTION:   -Provide Boost Breeze po TID, each supplement provides 250 kcal and 9 grams of protein -When diet advanced past clears, provide Ensure Enlive po BID, each supplement provides 350 kcal and 20 grams of protein -Encourage PO intake -RD to continue to monitor  NUTRITION DIAGNOSIS:   Inadequate oral intake related to other (see comment) (dislike for facility food) as evidenced by per patient/family report.  GOAL:   Patient will meet greater than or equal to 90% of their needs  MONITOR:   PO intake, Supplement acceptance, Diet advancement, Labs, Weight trends, Skin, I & O's  REASON FOR ASSESSMENT:   Consult Diet education  ASSESSMENT:   81 year old female with past medical history of hypertension, atrial fibrillation (on AC with apixaban), DLD, hypothyroidism, dementia who presented with epigastric pain and nausea but no reports of vomiting. She is from assisted-living facility.  Pt reports poor appetite since admission, pt states she was eating PTA but she really dislikes the food that is provided at her ALF. Pt states the food there has caused her to gain weight. Pt currently on clear liquid diet. Pt drink supplements at facility, would like them here. RD to order.  Pt with recent weight gain per weight history.  Nutrition-Focused physical exam completed. Findings are no fat depletion, mild muscle depletion, and mild edema.   Labs reviewed: Low K Elevated BUN & Creatinine  Diet Order:  Diet clear liquid Room service appropriate?: Yes; Fluid consistency:: Thin  Skin:  Reviewed, no issues  Last BM:  9/24  Height:   Ht Readings from Last 1 Encounters:  01/23/15 5\' 1"  (1.549 m)    Weight:   Wt Readings from Last 1 Encounters:  01/25/15 163 lb 9.3 oz (74.2 kg)    Ideal Body Weight:  47.7 kg  BMI:  Body mass index is 30.92 kg/(m^2).  Estimated Nutritional Needs:   Kcal:   1400-1600  Protein:  65-75g  Fluid:  1.6L/day  EDUCATION NEEDS:   No education needs identified at this time  Clayton Bibles, MS, RD, LDN Pager: [phone] After Hours Pager: [phone]

## 2015-01-25 NOTE — Progress Notes (Addendum)
Patient ID: Nicole Roberts, female   DOB: [birth year], 81 y.o.   MRN: [id number] [facility] PROGRESS NOTE  Ndidi Nesby RRN:[id number] DOB: Aug 08, 1921 DOA: 01/22/2015 PCP: Gwendolyn Grant, MD  Brief narrative:    81 year old female from assisted facility with past medical history of hypertension, atrial fibrillation (on [facility] with apixaban), DLD, hypothyroidism, dementia who presented with epigastric pain and nausea but no reports of vomiting. She is from assisted-living facility.  On admission, she was found to have acute pancreatitis although there was a possibility of duodenitis or peptic ulcer disease versus reactive ileus. Her lipase level was elevated at 1692 and her liver function enzymes were as follows, AST 813, ALT 461, ALP 214, total bilirubin 2.8. Abdominal US did not show acute findings.   Hospital course complicated with finding of E.Coli bacteremia for which she is receiving zosyn.  Anticipated discharge: anticipate discharge by 01/27/2015  if repeat blood cultures show no growth.  Assessment/Plan:    Principal Problem:   Acute pancreatitis / Abnormal LFTs (liver function tests) /  Abdominal pain, epigastric - CT abdomen on admission suggestive of acute pancreatitis although there was also mention of possible duodenitis or peptic ulcer disease versus reactive ileus - Lipase level on admission was 1692 and has subsequently trended down to 720. Repeat lipase level tomorrow am. - No acute intra-abdominal findings on abdominal ultrasound.  - LFT's on admission were as follows: AST 813, ALT 431, ALP 214 and bilirubin 2.8. For some reason blood work not done today so will have ti repeat tomorrow morning. OF note, in 2014 her LFT's were WNL. - Patient was seen by SLP who recommended thin liquids. Will advance from NPO to thin liquids today. May be able to have regular diet tomorrow.  - Continue supportive care with IV fluids, analgesia and antiemetics as needed. She is on  fentanyl patch 75 mcg (this is what she was on even before the admission). She is also on morphine 2 mg IV every 4 hours only if needed for intractable pain uncontrolled by fentanyl patch  - Use K pad as well for abdominal pain   Active Problems:   Sepsis secondary to Gram negative bacteremia / Leukocytosis / UTI - Patient had fever 9/23 (100.67F). She also had leukocytosis of 14.4. Sepsis criteria met 9/23 and sepsis work up initiated. Lactic acid was WNL and procalcitonin 21.54.Source of infection is E.Coli bacteremia and UTI - Blood cultures collected 9/23 are growing E.Coli. Urine culture is in process as of 9/25. - She was initially given Rocephin 9/23 for UTI but as we learned that blood cultures grew E.Coli we switched to Zosyn 9/24. - Patient is hemodynamically stable.    Acute encephalopathy - Likely from sepsis, UTI, bacteremia - Initially confused but today she completely oriented to time, place and person    Hypothyroidism - We will cntinue Synthroid    Dyslipidemia - Continue to hold statin therapy until liver function enzymes improve.    Essential hypertension - Continue imdur 15 mg daily, metoprolol 50 mg BID    Rheumatoid arthritis - Continue low-dose prednisone.    Atrial fibrillation on chronic anticoagulation  - CHADS vasc score at least 4 (age, HTN, CHF) - Continue anticoagulation with Eliquis - Continue metoprolol for rate control     Stage III chronic kidney disease - Baseline creatinine 1.86 (12/20/2012) - Creatinine 1.15 on this admission, stable  - BMP this morning is pending     Chronic combined, systolic and diastolic congestive heart failure -  2-D echo in 2014 showed ejection fraction of 45% - Continue lasix 20 mg daily  - Continue imdur, metoprolol - Continue Eliquis    Depression  - On Celexa.  - Stable.    Hypokalemia - Likely secondary to GI losses, nausea. - Supplemented.     Moderate protein calorie malnutrition - Likely in the  setting of chronic illness - Seen by dietician - Continue nutritional supplementation    Wheelchair bound - PT evaluation if patient able to participate     Cold sores - Will see with pharmacy if she can get acyclovir suspension for treatment of possible herpes virus cold sores   DVT Prophylaxis  - On full dose AC with apixaban   Code Status: DNR/DNI Family Communication:  plan of care discussed with the patient's son over the phone 9/25 [phone]) Disposition Plan: to ALF or SNF likely by 01/27/2015 if repeat blood cultures with no E.Coli   IV access:  Peripheral IV  Procedures and diagnostic studies:    US Abdomen Complete 01/24/2015   1. No acute abnormality identified. 2. Prior cholecystectomy with minimal intrahepatic biliary ductal prominence. 3. Bilateral renal cysts. Suboptimal visualization of the left kidney.     Ct Abdomen Pelvis Wo Contrast 01/23/2015   Inflammatory changes in the upper abdomen centered around the head of the pancreas and duodenum bulb region. This could be due to pancreatitis or inflammation from duodenitis or peptic ulcer disease. Mild dilatation of the duodenum probably represents reactive ileus. Followup after resolution of acute process recommended to exclude underlying duodenum neoplasm.   Electronically Signed   By: Lucienne Capers M.D.   On: 01/23/2015 02:17   Dg Chest Port 1 View 01/23/2015    Cardiomegaly with early interstitial edema pattern.   Electronically Signed   By: Jerilynn Mages.  Shick M.D.   On: 01/23/2015 10:55   Medical Consultants:  None   Other Consultants:  Physical therapy  Nutrition SLP  IAnti-Infectives:   Rocehin 01/24/2015 --> 01/24/2015 Zosyn 01/24/2015 -->    Leisa Lenz, MD  [facility] Pager [phone]  Time spent in minutes: 25 minutes  If 7PM-7AM, please contact night-coverage [email or web address] Password [facility] 01/25/2015, 12:33 PM   LOS: 2 days    HPI/Subjective: No acute overnight events. No reports of  vomiting.   Objective: Filed Vitals:   01/24/15 1400 01/24/15 2115 01/25/15 0659 01/25/15 0954  BP: 126/70 155/76 143/79 141/77  Pulse: 98 90 84 84  Temp: 99.6 F (37.6 C) 98.9 F (37.2 C) 98.6 F (37 C)   TempSrc: Oral Oral Oral   Resp: $Remo'18 18 18   'pBjmc$ Height:      Weight:   74.2 kg (163 lb 9.3 oz)   SpO2: 98% 94% 97%     Intake/Output Summary (Last 24 hours) at 01/25/15 1233 Last data filed at 01/25/15 1000  Gross per 24 hour  Intake   1945 ml  Output    800 ml  Net   1145 ml    Exam:   General:  Pt is not in acute distress  Cardiovascular: appreciate S1, S2, rate controlled   Respiratory: bilateral air entry, no wheezing   Abdomen: non tender, (+) BS  Extremities: no tenderness, palpable pulses   Neuro: Nonfocal  Data Reviewed: Basic Metabolic Panel:  Recent Labs Lab 01/23/15 0042  NA 141  K 3.4*  CL 106  CO2 26  GLUCOSE 78  BUN 25*  CREATININE 1.15*  CALCIUM 8.9   Liver Function Tests:  Recent Labs  Lab 01/23/15 0042  AST 813*  ALT 461*  ALKPHOS 214*  BILITOT 2.8*  PROT 6.7  ALBUMIN 3.6    Recent Labs Lab 01/23/15 0042 01/23/15 0900  LIPASE 1692* 720*   No results for input(s): AMMONIA in the last 168 hours. CBC:  Recent Labs Lab 01/23/15 0042  WBC 14.4*  NEUTROABS 13.5*  HGB 12.3  HCT 37.8  MCV 112.5*  PLT 144*   Cardiac Enzymes: No results for input(s): CKTOTAL, CKMB, CKMBINDEX, TROPONINI in the last 168 hours. BNP: Invalid input(s): POCBNP CBG: No results for input(s): GLUCAP in the last 168 hours.  Culture, blood (routine x 2)     Status: None   Collection Time: 01/23/15 10:21 AM  Result Value Ref Range Status   Specimen Description BLOOD RIGHT HAND  Final   Special Requests BOTTLES DRAWN AEROBIC AND ANAEROBIC 10CC  Final   Culture  Setup Time   Final   Culture   Final    ESCHERICHIA COLI    Report Status 01/25/2015 FINAL  Final   Organism ID, Bacteria ESCHERICHIA COLI  Final      Susceptibility   Escherichia  coli - MIC*    AMPICILLIN 8 SENSITIVE Sensitive     CEFAZOLIN <=4 SENSITIVE Sensitive     CEFEPIME <=1 SENSITIVE Sensitive     CEFTAZIDIME <=1 SENSITIVE Sensitive     CEFTRIAXONE <=1 SENSITIVE Sensitive     CIPROFLOXACIN <=0.25 SENSITIVE Sensitive     GENTAMICIN <=1 SENSITIVE Sensitive     IMIPENEM <=0.25 SENSITIVE Sensitive     TRIMETH/SULFA <=20 SENSITIVE Sensitive     AMPICILLIN/SULBACTAM 4 SENSITIVE Sensitive     PIP/TAZO <=4 SENSITIVE Sensitive     * ESCHERICHIA COLI  Culture, blood (routine x 2)     Status: None   Collection Time: 01/23/15 10:28 AM  Result Value Ref Range Status   Specimen Description BLOOD LEFT HAND  Final   Special Requests BOTTLES DRAWN AEROBIC AND ANAEROBIC 10CC  Final   Culture  Setup Time   Final   Culture   Final    ESCHERICHIA COLI    Report Status 01/25/2015 FINAL  Final     Scheduled Meds: . antiseptic oral rinse  7 mL Mouth Rinse q12n4p  . apixaban  2.5 mg Oral BID  . chlorhexidine  15 mL Mouth Rinse BID  . citalopram  20 mg Oral Daily  . conjugated estrogens  1 Applicatorful Vaginal Once per day on Mon Wed Fri  . feeding supplement  1 Container Oral TID BM  . fentaNYL  75 mcg Transdermal Q72H  . folic acid  334 mcg Oral q morning - 10a  . furosemide  20 mg Oral Daily  . gabapentin  800 mg Oral QHS  . isosorbide mononitrate  15 mg Oral Daily  . levothyroxine  50 mcg Oral Daily  . metoprolol succinate  50 mg Oral BID  . pantoprazole  40 mg Oral Daily  . piperacillin-tazobactam (ZOSYN)  IV  3.375 g Intravenous Q8H  . polyvinyl alcohol  1 drop Both Eyes TID  . predniSONE  5 mg Oral Q breakfast   Continuous Infusions: . sodium chloride 75 mL/hr at 01/24/15 [phone]

## 2015-01-26 DIAGNOSIS — G9340 Encephalopathy, unspecified: Secondary | ICD-10-CM

## 2015-01-26 DIAGNOSIS — E440 Moderate protein-calorie malnutrition: Secondary | ICD-10-CM

## 2015-01-26 DIAGNOSIS — K2980 Duodenitis without bleeding: Secondary | ICD-10-CM | POA: Diagnosis present

## 2015-01-26 DIAGNOSIS — E039 Hypothyroidism, unspecified: Secondary | ICD-10-CM

## 2015-01-26 DIAGNOSIS — I480 Paroxysmal atrial fibrillation: Secondary | ICD-10-CM

## 2015-01-26 DIAGNOSIS — N390 Urinary tract infection, site not specified: Secondary | ICD-10-CM

## 2015-01-26 LAB — LIPASE, BLOOD: Lipase: 44 U/L (ref 22–51)

## 2015-01-26 LAB — COMPREHENSIVE METABOLIC PANEL
ALBUMIN: 2.8 g/dL — AB (ref 3.5–5.0)
ALT: 200 U/L — ABNORMAL HIGH (ref 14–54)
ANION GAP: 8 (ref 5–15)
AST: 49 U/L — ABNORMAL HIGH (ref 15–41)
Alkaline Phosphatase: 149 U/L — ABNORMAL HIGH (ref 38–126)
BUN: 22 mg/dL — ABNORMAL HIGH (ref 6–20)
CALCIUM: 7.8 mg/dL — AB (ref 8.9–10.3)
CHLORIDE: 109 mmol/L (ref 101–111)
CO2: 24 mmol/L (ref 22–32)
Creatinine, Ser: 1.02 mg/dL — ABNORMAL HIGH (ref 0.44–1.00)
GFR calc non Af Amer: 47 mL/min — ABNORMAL LOW (ref >60)
GFR, EST AFRICAN AMERICAN: 54 mL/min — AB (ref >60)
Glucose, Bld: 90 mg/dL (ref 65–99)
POTASSIUM: 3.3 mmol/L — AB (ref 3.5–5.1)
SODIUM: 141 mmol/L (ref 135–145)
Total Bilirubin: 1.6 mg/dL — ABNORMAL HIGH (ref 0.3–1.2)
Total Protein: 5.5 g/dL — ABNORMAL LOW (ref 6.5–8.1)

## 2015-01-26 LAB — CBC
HCT: 28 % — ABNORMAL LOW (ref 36.0–46.0)
Hemoglobin: 9.6 g/dL — ABNORMAL LOW (ref 12.0–15.0)
MCH: 37.8 pg — AB (ref 26.0–34.0)
MCHC: 34.3 g/dL (ref 30.0–36.0)
MCV: 110.2 fL — ABNORMAL HIGH (ref 78.0–100.0)
PLATELETS: 88 10*3/uL — AB (ref 150–400)
RBC: 2.54 MIL/uL — ABNORMAL LOW (ref 3.87–5.11)
RDW: 16.3 % — AB (ref 11.5–15.5)
WBC: 8.8 10*3/uL (ref 4.0–10.5)

## 2015-01-26 LAB — URINE CULTURE

## 2015-01-26 MED ORDER — POTASSIUM CHLORIDE CRYS ER 20 MEQ PO TBCR
20.0000 meq | EXTENDED_RELEASE_TABLET | Freq: Two times a day (BID) | ORAL | Status: DC
  Administered 2015-01-26 (×2): 20 meq via ORAL
  Filled 2015-01-26 (×4): qty 1

## 2015-01-26 MED ORDER — MAGIC MOUTHWASH W/LIDOCAINE
10.0000 mL | Freq: Four times a day (QID) | ORAL | Status: DC
  Administered 2015-01-26 – 2015-01-31 (×17): 10 mL via ORAL
  Filled 2015-01-26 (×27): qty 10

## 2015-01-26 MED ORDER — DEXTROSE 5 % IV SOLN
2.0000 g | INTRAVENOUS | Status: DC
  Administered 2015-01-26: 2 g via INTRAVENOUS
  Filled 2015-01-26: qty 2

## 2015-01-26 MED ORDER — CIPROFLOXACIN HCL 500 MG PO TABS
500.0000 mg | ORAL_TABLET | Freq: Two times a day (BID) | ORAL | Status: DC
  Administered 2015-01-26 – 2015-01-27 (×3): 500 mg via ORAL
  Filled 2015-01-26 (×6): qty 1

## 2015-01-26 MED ORDER — ENSURE ENLIVE PO LIQD
237.0000 mL | Freq: Two times a day (BID) | ORAL | Status: DC
Start: 2015-01-26 — End: 2015-02-01
  Administered 2015-01-27 – 2015-01-31 (×8): 237 mL via ORAL

## 2015-01-26 MED ORDER — METRONIDAZOLE 500 MG PO TABS
500.0000 mg | ORAL_TABLET | Freq: Three times a day (TID) | ORAL | Status: DC
  Administered 2015-01-26 – 2015-01-28 (×6): 500 mg via ORAL
  Filled 2015-01-26 (×9): qty 1

## 2015-01-26 NOTE — Care Management Important Message (Signed)
Important Message  Patient Details  Name: Nicole Roberts MRN: [id number] Date of Birth: 07/10/1921   Medicare Important Message Given:  Yes-second notification given    Camillo Flaming 01/26/2015, 12:39 [place] Message  Patient Details  Name: Nicole Roberts MRN: [id number] Date of Birth: Nov 18, 1921   Medicare Important Message Given:  Yes-second notification given    Camillo Flaming 01/26/2015, 12:39 PM

## 2015-01-26 NOTE — Progress Notes (Signed)
Progress Note   Roshawnda Pecora DQQ:[id number] DOB: 05-Oct-1921 DOA: 01/22/2015 PCP: Gwendolyn Grant, MD   Brief Narrative:   Nicole Roberts is an 81 y.o. female from an ALF with a PMH of hypertension, atrial fibrillation (on [facility] with apixaban), DLD, hypothyroidism, dementia who was admitted 01/22/15 with a chief complaint of epigastric pain and nausea but no reports of vomiting.   On admission, she was found to have acute pancreatitis versus duodenitis or peptic ulcer disease versus reactive ileus. Her lipase level was elevated at 1692 and her liver function enzymes were as follows, AST 813, ALT 461, ALP 214, total bilirubin 2.8. Abdominal US did not show acute findings.   Hospital course complicated by E.Coli bacteremia for which she was placed on zosyn.  Assessment/Plan:   Principal Problem:   Acute pancreatitis and duodenitis with acute epigastric abdominal pain, elevated LFTs and leukocytosis - Suspect CMV duodenitis with resultant acute pancreatic inflammation given negative ultrasound. - Treated conservatively with empiric anti-viral medications, Zosyn, bowel rest and IVF. - Advance diet to soft and switch antibiotics to Flagyl/Cipro. Lipase now WNL. - Check CMV IgM antibodies.  Active Problems:   Hypothyroidism - Continue Synthroid.    Dyslipidemia - Statin on hold secondary to elevated LFTs.    Essential hypertension - Reasonably controlled on Imdur and metoprolol.    GERD    Rheumatoid arthritis - On chronic prednisone.    Atrial fibrillation - Continue Eliquis and metoprolol.    Stage III chronic kidney disease - Baseline creatinine 1.86 (12/20/2012). Current creatinine improved over baseline value.    Depression - Continue Celexa.    Hypokalemia - Continue to provide supplementation.    Neuropathy    Chronic combined systolic and diastolic CHF (congestive heart failure) - Currently compensated. Watch closely given weight trending up.    UTI  (urinary tract infection)/Bacteremia due to Escherichia coli/Sepsis due to Escherichia coli (E. Coli) - Initially treated with broad-spectrum antibiotics (Zosyn) to cover duodenitis. - Antibiotics switched to Cipro/Flagyl for narrower coverage.    Thrombocytopenia - No signs or symptoms of bleeding. Monitoring.    Protein-calorie malnutrition, moderate - Seen by dietitian 01/25/15. Continue nutritional supplements per recommendations.    Wheelchair bound - From an ALF.    Acute encephalopathy - Continue to treat underlying sepsis/infection. Has a sitter at the bedside.    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 Check CMV IgM titers given duodenitis. On acyclovir. - Add Magic mouthwash.    DVT Prophylaxis - On chronic anticoagulation.  Family Communication: Sitter at the bedside. Disposition Plan: From [facility] with a Actuary. Code Status:     Code Status Orders        Start     Ordered   01/23/15 0313  Do not attempt resuscitation (DNR)   Continuous    Question Answer Comment  In the event of cardiac or respiratory ARREST Do not call a "code blue"   In the event of cardiac or respiratory ARREST Do not perform Intubation, CPR, defibrillation or ACLS   In the event of cardiac or respiratory ARREST Use medication by any route, position, wound care, and other measures to relive pain and suffering. May use oxygen, suction and manual treatment of airway obstruction as needed for comfort.   Comments Noted most recently in 8/2 PMR note, also noted during 2014 admit.      01/23/15 0314    Advance Directive Documentation        Most Recent Value  Type of Advance Directive  Out of facility DNR (pink MOST or yellow form)   Pre-existing out of facility DNR order (yellow form or pink MOST form)  Yellow form placed in chart (order not valid for inpatient use)   "MOST" Form in Place?          IV Access:    Peripheral IV   Procedures and diagnostic studies:   Ct  Abdomen Pelvis Wo Contrast  01/23/2015   CLINICAL DATA:  Severe mid abdominal pain intermittent for a few days. Epigastric pain. Nausea. Decreased bowel movements. Oral contrast only per ordering physician.  EXAM: CT ABDOMEN AND PELVIS WITHOUT CONTRAST  TECHNIQUE: Multidetector CT imaging of the abdomen and pelvis was performed following the standard protocol without IV contrast.  COMPARISON:  CT chest abdomen and pelvis 06/03/2011  FINDINGS: Examination of solid organs and vascular structures is limited without IV contrast material. Evaluation of bowel structures is limited without oral contrast material.  Dependent atelectasis in the lung bases. Residual contrast material in the lower esophagus may indicate reflux or dysmotility.  Surgical absence of the gallbladder. The unenhanced appearance of the liver, spleen, adrenal glands, inferior vena cava, and retroperitoneal lymph nodes is unremarkable. Large exophytic lesions in both kidneys consistent with cysts as seen on previous study. No hydronephrosis in either kidney. 2 mm stone in the midportion left kidney. There is infiltration in the fat around the head of the pancreas and duodenal bulb region. Probable thickening of the duodenal wall. This could represent inflammation due to pancreatitis or duodenitis. No pancreatic ductal dilatation. Mild dilatation of the duodenum and proximal small bowel without transition zone. This is likely ileus related to the inflammatory process. No definite obstruction. No evidence of abscess or perforation. No free air in the abdomen. Given the wall thickening in the duodenum, recommend follow-up after resolution of acute process to exclude an underlying duodenum neoplasm.  Pelvis: Small umbilical hernia containing fat. The appendix is normal. Evaluation of the low pelvis is limited due to streak artifact from bilateral hip arthroplasties. No gross pelvic mass or lymphadenopathy. Visualized bladder wall is not thickened. No  evidence of diverticulitis. Degenerative changes throughout the lumbar spine. No destructive bone lesions.  IMPRESSION: Inflammatory changes in the upper abdomen centered around the head of the pancreas and duodenum bulb region. This could be due to pancreatitis or inflammation from duodenitis or peptic ulcer disease. Mild dilatation of the duodenum probably represents reactive ileus. Followup after resolution of acute process recommended to exclude underlying duodenum neoplasm.   Electronically Signed   By: Lucienne Capers M.D.   On: 01/23/2015 02:17   US Abdomen Complete  01/24/2015   CLINICAL DATA:  Abnormal liver function tests. Prior cholecystectomy.  EXAM: ULTRASOUND ABDOMEN COMPLETE  COMPARISON:  CT abdomen and pelvis 01/23/2015  FINDINGS: Gallbladder: Surgically absent.  Common bile duct: Diameter: 6 mm  Liver: No focal lesion identified. Within normal limits in parenchymal echogenicity. Slight intrahepatic biliary ductal prominence likely related to prior cholecystectomy.  IVC: No abnormality visualized.  Pancreas: Visualized portion unremarkable. Distal body and tail obscured.  Spleen: Size and appearance within normal limits.  Right Kidney: Length: 9.5 cm. Echogenicity within normal limits. No solid mass or hydronephrosis visualized. 4.2 cm interpolar cyst.  Left Kidney: Length: 9.8 cm. 5.4 cm interpolar cyst, with poor visualization of the remainder of the renal parenchyma.  Abdominal aorta: No aneurysm visualized.  Other findings: None.  IMPRESSION: 1. No acute abnormality identified. 2. Prior cholecystectomy with minimal intrahepatic  biliary ductal prominence. 3. Bilateral renal cysts. Suboptimal visualization of the left kidney.   Electronically Signed   By: Logan Bores M.D.   On: 01/24/2015 10:36   Dg Chest Port 1 View  01/23/2015   CLINICAL DATA:  Fever, mitral valve prolapse, atrial fibrillation and hypertension  EXAM: PORTABLE CHEST 1 VIEW  COMPARISON:  12/19/2012  FINDINGS: Left  subclavian 2 lead pacer noted. Cardiomegaly evident with increased vascular and interstitial opacities compatible with early edema related CHF. No large effusion or pneumothorax. No focal pneumonia, collapse or consolidation. Degenerative changes of the spine and shoulders. Trachea is midline. Air within the upper thoracic esophagus noted. This projects over the tracheal air column.  IMPRESSION: Cardiomegaly with early interstitial edema pattern.   Electronically Signed   By: Jerilynn Mages.  Shick M.D.   On: 01/23/2015 10:55     Medical Consultants:    None.  Anti-Infectives:   Anti-infectives    Start     Dose/Rate Route Frequency Ordered Stop   01/26/15 0815  cefTRIAXone (ROCEPHIN) 1 g in dextrose 5 % 50 mL IVPB     1 g 100 mL/hr over 30 Minutes Intravenous Every 24 hours 01/26/15 0801     01/25/15 1930  acyclovir (ZOVIRAX) tablet 400 mg     400 mg Oral 5 times daily 01/25/15 1921 02/01/15 1759   01/24/15 2100  piperacillin-tazobactam (ZOSYN) IVPB 3.375 g  Status:  Discontinued     3.375 g 12.5 mL/hr over 240 Minutes Intravenous Every 8 hours 01/24/15 1012 01/26/15 0801   01/24/15 1030  piperacillin-tazobactam (ZOSYN) IVPB 3.375 g     3.375 g 100 mL/hr over 30 Minutes Intravenous STAT 01/24/15 1010 01/24/15 1333   01/23/15 2300  cefTRIAXone (ROCEPHIN) 1 g in dextrose 5 % 50 mL IVPB  Status:  Discontinued     1 g 100 mL/hr over 30 Minutes Intravenous Every 24 hours 01/23/15 2255 01/24/15 6568      Subjective:   Asher Babilonia is pleasantly confused. She is mainly complaining of mouth pain today. No nausea or vomiting. No dyspnea. No cough. She has a Actuary at the bedside.  Objective:    Filed Vitals:   01/25/15 0659 01/25/15 0954 01/25/15 2200 01/26/15 0515  BP: 143/79 141/77 129/72 150/81  Pulse: 84 84 86 86  Temp: 98.6 F (37 C)  98.5 F (36.9 C) 98 F (36.7 C)  TempSrc: Oral  Oral Oral  Resp: 18  16 18   Height:      Weight: 74.2 kg (163 lb 9.3 oz)     SpO2: 97%  98% 98%     Intake/Output Summary (Last 24 hours) at 01/26/15 0802 Last data filed at 01/26/15 0700  Gross per 24 hour  Intake 1637.05 ml  Output   1000 ml  Net 637.05 ml   Filed Weights   01/23/15 0400 01/23/15 1840 01/25/15 0659  Weight: 67.495 kg (148 lb 12.8 oz) 73.7 kg (162 lb 7.7 oz) 74.2 kg (163 lb 9.3 oz)    Exam: Gen:  NAD, pleasantly confused Cardiovascular:  RRR, No M/R/G Respiratory:  Lungs CTAB Gastrointestinal:  Abdomen soft, NT/ND, + BS Extremities:  Right foot slightly edematous compared to left   Data Reviewed:    Labs: Basic Metabolic Panel:  Recent Labs Lab 01/23/15 0042 01/26/15 0522  NA 141 141  K 3.4* 3.3*  CL 106 109  CO2 26 24  GLUCOSE 78 90  BUN 25* 22*  CREATININE 1.15* 1.02*  CALCIUM 8.9 7.8*  GFR Estimated Creatinine Clearance: 33.8 mL/min (by C-G formula based on Cr of 1.02). Liver Function Tests:  Recent Labs Lab 01/23/15 0042 01/26/15 0522  AST 813* 49*  ALT 461* 200*  ALKPHOS 214* 149*  BILITOT 2.8* 1.6*  PROT 6.7 5.5*  ALBUMIN 3.6 2.8*    Recent Labs Lab 01/23/15 0042 01/23/15 0900 01/26/15 0522  LIPASE 1692* 720* 44   CBC:  Recent Labs Lab 01/23/15 0042 01/26/15 0522  WBC 14.4* 8.8  NEUTROABS 13.5*  --   HGB 12.3 9.6*  HCT 37.8 28.0*  MCV 112.5* 110.2*  PLT 144* 88*   Sepsis Labs:  Recent Labs Lab 01/23/15 0042 01/24/15 1041 01/24/15 1325 01/26/15 0522  PROCALCITON  --  21.54  --   --   WBC 14.4*  --   --  8.8  LATICACIDVEN  --  1.4 1.7  --    Microbiology Recent Results (from the past 240 hour(s))  Culture, blood (routine x 2)     Status: None   Collection Time: 01/23/15 10:21 AM  Result Value Ref Range Status   Specimen Description BLOOD RIGHT HAND  Final   Special Requests BOTTLES DRAWN AEROBIC AND ANAEROBIC 10CC  Final   Culture  Setup Time   Final    GRAM NEGATIVE RODS IN BOTH AEROBIC AND ANAEROBIC BOTTLES CRITICAL RESULT CALLED TO, READ BACK BY AND VERIFIED WITH: Eston Esters RN [id number]  01/23/15 A BROWNING    Culture   Final    ESCHERICHIA COLI Performed at [facility]    Report Status 01/25/2015 FINAL  Final   Organism ID, Bacteria ESCHERICHIA COLI  Final      Susceptibility   Escherichia coli - MIC*    AMPICILLIN 8 SENSITIVE Sensitive     CEFAZOLIN <=4 SENSITIVE Sensitive     CEFEPIME <=1 SENSITIVE Sensitive     CEFTAZIDIME <=1 SENSITIVE Sensitive     CEFTRIAXONE <=1 SENSITIVE Sensitive     CIPROFLOXACIN <=0.25 SENSITIVE Sensitive     GENTAMICIN <=1 SENSITIVE Sensitive     IMIPENEM <=0.25 SENSITIVE Sensitive     TRIMETH/SULFA <=20 SENSITIVE Sensitive     AMPICILLIN/SULBACTAM 4 SENSITIVE Sensitive     PIP/TAZO <=4 SENSITIVE Sensitive     * ESCHERICHIA COLI  Culture, blood (routine x 2)     Status: None   Collection Time: 01/23/15 10:28 AM  Result Value Ref Range Status   Specimen Description BLOOD LEFT HAND  Final   Special Requests BOTTLES DRAWN AEROBIC AND ANAEROBIC 10CC  Final   Culture  Setup Time   Final    GRAM NEGATIVE RODS IN BOTH AEROBIC AND ANAEROBIC BOTTLES CRITICAL RESULT CALLED TO, READ BACK BY AND VERIFIED WITH: Richard Miu DYKE RN [id number] 01/23/15 A BROWNING    Culture   Final    ESCHERICHIA COLI SUSCEPTIBILITIES PERFORMED ON PREVIOUS CULTURE WITHIN THE LAST 5 DAYS. Performed at [facility]    Report Status 01/25/2015 FINAL  Final  Culture, Urine     Status: None (Preliminary result)   Collection Time: 01/23/15  4:06 PM  Result Value Ref Range Status   Specimen Description URINE, CLEAN CATCH  Final   Special Requests NONE  Final   Culture   Final    >=100,000 COLONIES/mL ESCHERICHIA COLI Performed at [facility]    Report Status PENDING  Incomplete     Medications:   . acyclovir  400 mg Oral 5 X Daily  . antiseptic oral rinse  7 mL  Mouth Rinse q12n4p  . apixaban  2.5 mg Oral BID  . cefTRIAXone (ROCEPHIN)  IV  1 g Intravenous Q24H  . chlorhexidine  15 mL Mouth Rinse BID  . citalopram  20 mg Oral Daily  .  conjugated estrogens  1 Applicatorful Vaginal Once per day on Mon Wed Fri  . feeding supplement  1 Container Oral TID BM  . fentaNYL  75 mcg Transdermal Q72H  . folic acid  831 mcg Oral q morning - 10a  . furosemide  20 mg Oral Daily  . gabapentin  800 mg Oral QHS  . isosorbide mononitrate  15 mg Oral Daily  . levothyroxine  50 mcg Oral Daily  . metoprolol succinate  50 mg Oral BID  . pantoprazole  40 mg Oral Daily  . polyvinyl alcohol  1 drop Both Eyes TID  . predniSONE  5 mg Oral Q breakfast   Continuous Infusions: . sodium chloride 75 mL/hr at 01/25/15 2300    Time spent: 35 minutes.  The patient is medically complex and requires high complexity decision making.    LOS: 3 days   [facility] Pager [phone]. If unable to reach me by pager, please call my cell phone at [phone].  *Please refer to [email or web address], password [email or web address] to get updated schedule on who will round on this patient, as hospitalists switch teams weekly. If 7PM-7AM, please contact night-coverage at [email or web address], password [email or web address] for any overnight needs.  01/26/2015, 8:02 AM          \

## 2015-01-26 NOTE — Clinical Social Work Note (Signed)
Clinical Social Work Assessment  Patient Details  Name: Nicole Roberts MRN: [id number] Date of Birth: 08-04-1921  Date of referral:  01/23/15               Reason for consult:  Discharge Planning, Facility Placement                Permission sought to share information with:  Facility Sport and exercise psychologist, Family Supports Permission granted to share information::  Yes, Verbal Permission Granted  Name::     Nicole Roberts  Agency::  [facility]  Relationship::  Son  Contact Information:  [phone]  Housing/Transportation Living arrangements for the past 2 months:  [place] of Information:  Adult Children (Son: Nicole Roberts ) Patient Interpreter Needed:  None Criminal Activity/Legal Involvement Pertinent to Current Situation/Hospitalization:  No - Comment as needed Significant Relationships:  Adult Children Lives with:  Facility Resident Do you feel safe going back to the place where you live?  Yes Need for family participation in patient care:  Yes (Comment)  Care giving concerns:  Son Nicole Roberts expressed to [place] his concerns regarding his mother's diet. Son is agreeable to patient returning to facility.    Social Worker assessment / plan:  CSW spoke with patient's son Nicole Roberts regarding patient's return to facility once medically stable and ready for discharge. Son is agreeable to patient returning back to [facility]. CSW contacted [facility] to address any concerns regarding the patient returning. RN Carla Drape is agreeable to patient returning to the facility. Patient is wheelchair bound which is patient's baseline. CSW to update FL2 and fax to facility.   Employment status:  Disabled (Comment on whether or not currently receiving Disability) Insurance information:  Medicare PT Recommendations:  Not assessed at this time Information / Referral to community resources:  [place]  Patient/Family's Response to care:  Patient's son  Nicole Roberts expressed to [place] that he would like to see his mother on a solid diet. Son is also agreeable to patient returning to facility.   Patient/Family's Understanding of and Emotional Response to Diagnosis, Current Treatment, and Prognosis:  Son is understanding and aware of patient's medical condition.   Emotional Assessment Appearance:  Appears stated age Attitude/Demeanor/Rapport:  Unable to Assess Affect (typically observed):  Unable to Assess Orientation:  Oriented to Self Alcohol / Substance use:  Not Applicable Psych involvement (Current and /or in the community):  No (Comment)  Discharge Needs  Concerns to be addressed:  Discharge Planning Concerns Readmission within the last 30 days:  No Current discharge risk:  None Barriers to Discharge:  Barriers Resolved   Raymondo Band, LCSW 01/26/2015, 2:37 PM

## 2015-01-27 ENCOUNTER — Inpatient Hospital Stay (HOSPITAL_COMMUNITY): Payer: Medicare Other

## 2015-01-27 ENCOUNTER — Inpatient Hospital Stay (HOSPITAL_COMMUNITY)
Admit: 2015-01-27 | Discharge: 2015-01-27 | Disposition: A | Discharge status: Home / Self Care | Payer: Medicare Other | Attending: Internal Medicine | Admitting: Internal Medicine

## 2015-01-27 DIAGNOSIS — R933 Abnormal findings on diagnostic imaging of other parts of digestive tract: Secondary | ICD-10-CM

## 2015-01-27 DIAGNOSIS — R1013 Epigastric pain: Secondary | ICD-10-CM

## 2015-01-27 DIAGNOSIS — R197 Diarrhea, unspecified: Secondary | ICD-10-CM | POA: Insufficient documentation

## 2015-01-27 DIAGNOSIS — K859 Acute pancreatitis, unspecified: Secondary | ICD-10-CM

## 2015-01-27 DIAGNOSIS — B9620 Unspecified Escherichia coli [E. coli] as the cause of diseases classified elsewhere: Secondary | ICD-10-CM

## 2015-01-27 DIAGNOSIS — R609 Edema, unspecified: Secondary | ICD-10-CM

## 2015-01-27 DIAGNOSIS — K851 Biliary acute pancreatitis: Secondary | ICD-10-CM

## 2015-01-27 DIAGNOSIS — R7989 Other specified abnormal findings of blood chemistry: Secondary | ICD-10-CM

## 2015-01-27 DIAGNOSIS — I481 Persistent atrial fibrillation: Secondary | ICD-10-CM

## 2015-01-27 DIAGNOSIS — R600 Localized edema: Secondary | ICD-10-CM | POA: Diagnosis present

## 2015-01-27 DIAGNOSIS — K2980 Duodenitis without bleeding: Secondary | ICD-10-CM

## 2015-01-27 DIAGNOSIS — D696 Thrombocytopenia, unspecified: Secondary | ICD-10-CM

## 2015-01-27 LAB — COMPREHENSIVE METABOLIC PANEL
ALBUMIN: 2.7 g/dL — AB (ref 3.5–5.0)
ALK PHOS: 129 U/L — AB (ref 38–126)
ALT: 147 U/L — AB (ref 14–54)
ANION GAP: 4 — AB (ref 5–15)
AST: 30 U/L (ref 15–41)
BUN: 19 mg/dL (ref 6–20)
CALCIUM: 7.7 mg/dL — AB (ref 8.9–10.3)
CO2: 23 mmol/L (ref 22–32)
Chloride: 112 mmol/L — ABNORMAL HIGH (ref 101–111)
Creatinine, Ser: 0.89 mg/dL (ref 0.44–1.00)
GFR calc Af Amer: >60 mL/min (ref >60)
GFR calc non Af Amer: 55 mL/min — ABNORMAL LOW (ref >60)
GLUCOSE: 82 mg/dL (ref 65–99)
Potassium: 3.4 mmol/L — ABNORMAL LOW (ref 3.5–5.1)
SODIUM: 139 mmol/L (ref 135–145)
Total Bilirubin: 1 mg/dL (ref 0.3–1.2)
Total Protein: 5.4 g/dL — ABNORMAL LOW (ref 6.5–8.1)

## 2015-01-27 LAB — CBC
HCT: 31.2 % — ABNORMAL LOW (ref 36.0–46.0)
HEMOGLOBIN: 10.4 g/dL — AB (ref 12.0–15.0)
MCH: 37 pg — ABNORMAL HIGH (ref 26.0–34.0)
MCHC: 33.3 g/dL (ref 30.0–36.0)
MCV: 111 fL — ABNORMAL HIGH (ref 78.0–100.0)
Platelets: 102 10*3/uL — ABNORMAL LOW (ref 150–400)
RBC: 2.81 MIL/uL — AB (ref 3.87–5.11)
RDW: 16.1 % — ABNORMAL HIGH (ref 11.5–15.5)
WBC: 8 10*3/uL (ref 4.0–10.5)

## 2015-01-27 LAB — CMV IGM

## 2015-01-27 LAB — MAGNESIUM: MAGNESIUM: 1.3 mg/dL — AB (ref 1.7–2.4)

## 2015-01-27 MED ORDER — SUCRALFATE 1 GM/10ML PO SUSP
1.0000 g | Freq: Once | ORAL | Status: AC
  Administered 2015-01-28: 1 g via ORAL
  Filled 2015-01-27: qty 10

## 2015-01-27 MED ORDER — POTASSIUM CHLORIDE CRYS ER 20 MEQ PO TBCR
20.0000 meq | EXTENDED_RELEASE_TABLET | Freq: Three times a day (TID) | ORAL | Status: DC
  Administered 2015-01-27 – 2015-01-29 (×9): 20 meq via ORAL
  Filled 2015-01-27 (×11): qty 1

## 2015-01-27 NOTE — Progress Notes (Signed)
Progress Note   Avaya Mcjunkins JOI:[id number] DOB: Jul 13, 1921 DOA: 01/22/2015 PCP: Gwendolyn Grant, MD   Brief Narrative:   Nicole Roberts is an 81 y.o. female from an ALF with a PMH of hypertension, PUD, atrial fibrillation (on [facility] with apixaban), DLD, hypothyroidism, dementia who was admitted 01/22/15 with a chief complaint of epigastric pain and nausea but no reports of vomiting.  On admission, she was found to have acute pancreatitis versus duodenitis or peptic ulcer disease versus reactive ileus. Her lipase level was elevated at 1692 and her liver function enzymes were as follows, AST 813, ALT 461, ALP 214, total bilirubin 2.8. Abdominal US did not show acute findings. Hospital course complicated by E.Coli bacteremia for which she was placed on zosyn.  Assessment/Plan:   Principal Problem:   Acute pancreatitis and duodenitis with acute epigastric abdominal pain, elevated LFTs and leukocytosis - Suspect underlying PUD/duodenal ulcer with resultant acute pancreatic inflammation given negative ultrasound. - Initially treated conservatively with empiric anti-viral medications, Zosyn, bowel rest and IVF. - Diet advanced to soft 01/26/15, but appetite poor.  Antibiotics switched to Flagyl/Cipro 01/26/15. Lipase now WNL. - CMV antibodies negative. - Will need GI consult to R/O duodenal ulcer given h/o PUD.  Active Problems:   RLE edema - RLE doppler to R/O DVT.    Hypothyroidism - Continue Synthroid.    Dyslipidemia - Statin on hold secondary to elevated LFTs.    Essential hypertension - Reasonably controlled on Imdur and metoprolol.    GERD - Continue Protonix.    Rheumatoid arthritis - On chronic prednisone.    Atrial fibrillation - Continue Eliquis and metoprolol.    Stage III chronic kidney disease - Baseline creatinine 1.86 (12/20/2012). Current creatinine improved over baseline value.    Depression - Continue Celexa.    Hypokalemia - Continue to provide  supplementation. Check magnesium.    Neuropathy - Continue Neurontin.    Chronic combined systolic and diastolic CHF (congestive heart failure) - Currently compensated. Watch closely given weight trending up.    UTI (urinary tract infection)/Bacteremia due to Escherichia coli/Sepsis due to Escherichia coli (E. Coli) - Initially treated with broad-spectrum antibiotics (Zosyn) to cover duodenitis and UTI. - Antibiotics switched to Cipro/Flagyl for narrower coverage.   - Will ask ID for guidance regarding duration of therapy & optimal antibiotic.    Thrombocytopenia - No signs or symptoms of bleeding. Monitoring.    Protein-calorie malnutrition, moderate - Seen by dietitian 01/25/15. Continue nutritional supplements per recommendations.    Wheelchair bound - From an ALF.    Acute encephalopathy - Continue to treat underlying sepsis/infection.     Herpes labialis - Continue acyclovir and Magic mouthwash.    DVT Prophylaxis - On chronic anticoagulation.  Family Communication: Son, Art, updated by telephone. Disposition Plan: From [facility] with a Actuary.  Wheelchair bound.  Anticipate d/c back there after GI consultation obtained and course of antibiotic therapy defined by ID. Code Status:     Code Status Orders        Start     Ordered   01/23/15 0313  Do not attempt resuscitation (DNR)   Continuous    Question Answer Comment  In the event of cardiac or respiratory ARREST Do not call a "code blue"   In the event of cardiac or respiratory ARREST Do not perform Intubation, CPR, defibrillation or ACLS   In the event of cardiac or respiratory ARREST Use medication by any route, position, wound care, and other measures  to relive pain and suffering. May use oxygen, suction and manual treatment of airway obstruction as needed for comfort.   Comments Noted most recently in 8/2 PMR note, also noted during 2014 admit.      01/23/15 0314    Advance Directive Documentation         Most Recent Value   Type of Advance Directive  Out of facility DNR (pink MOST or yellow form)   Pre-existing out of facility DNR order (yellow form or pink MOST form)  Yellow form placed in chart (order not valid for inpatient use)   "MOST" Form in Place?          IV Access:    Peripheral IV   Procedures and diagnostic studies:   Ct Abdomen Pelvis Wo Contrast  01/23/2015   CLINICAL DATA:  Severe mid abdominal pain intermittent for a few days. Epigastric pain. Nausea. Decreased bowel movements. Oral contrast only per ordering physician.  EXAM: CT ABDOMEN AND PELVIS WITHOUT CONTRAST  TECHNIQUE: Multidetector CT imaging of the abdomen and pelvis was performed following the standard protocol without IV contrast.  COMPARISON:  CT chest abdomen and pelvis 06/03/2011  FINDINGS: Examination of solid organs and vascular structures is limited without IV contrast material. Evaluation of bowel structures is limited without oral contrast material.  Dependent atelectasis in the lung bases. Residual contrast material in the lower esophagus may indicate reflux or dysmotility.  Surgical absence of the gallbladder. The unenhanced appearance of the liver, spleen, adrenal glands, inferior vena cava, and retroperitoneal lymph nodes is unremarkable. Large exophytic lesions in both kidneys consistent with cysts as seen on previous study. No hydronephrosis in either kidney. 2 mm stone in the midportion left kidney. There is infiltration in the fat around the head of the pancreas and duodenal bulb region. Probable thickening of the duodenal wall. This could represent inflammation due to pancreatitis or duodenitis. No pancreatic ductal dilatation. Mild dilatation of the duodenum and proximal small bowel without transition zone. This is likely ileus related to the inflammatory process. No definite obstruction. No evidence of abscess or perforation. No free air in the abdomen. Given the wall thickening in the duodenum,  recommend follow-up after resolution of acute process to exclude an underlying duodenum neoplasm.  Pelvis: Small umbilical hernia containing fat. The appendix is normal. Evaluation of the low pelvis is limited due to streak artifact from bilateral hip arthroplasties. No gross pelvic mass or lymphadenopathy. Visualized bladder wall is not thickened. No evidence of diverticulitis. Degenerative changes throughout the lumbar spine. No destructive bone lesions.  IMPRESSION: Inflammatory changes in the upper abdomen centered around the head of the pancreas and duodenum bulb region. This could be due to pancreatitis or inflammation from duodenitis or peptic ulcer disease. Mild dilatation of the duodenum probably represents reactive ileus. Followup after resolution of acute process recommended to exclude underlying duodenum neoplasm.   Electronically Signed   By: Lucienne Capers M.D.   On: 01/23/2015 02:17   US Abdomen Complete  01/24/2015   CLINICAL DATA:  Abnormal liver function tests. Prior cholecystectomy.  EXAM: ULTRASOUND ABDOMEN COMPLETE  COMPARISON:  CT abdomen and pelvis 01/23/2015  FINDINGS: Gallbladder: Surgically absent.  Common bile duct: Diameter: 6 mm  Liver: No focal lesion identified. Within normal limits in parenchymal echogenicity. Slight intrahepatic biliary ductal prominence likely related to prior cholecystectomy.  IVC: No abnormality visualized.  Pancreas: Visualized portion unremarkable. Distal body and tail obscured.  Spleen: Size and appearance within normal limits.  Right Kidney: Length:  9.5 cm. Echogenicity within normal limits. No solid mass or hydronephrosis visualized. 4.2 cm interpolar cyst.  Left Kidney: Length: 9.8 cm. 5.4 cm interpolar cyst, with poor visualization of the remainder of the renal parenchyma.  Abdominal aorta: No aneurysm visualized.  Other findings: None.  IMPRESSION: 1. No acute abnormality identified. 2. Prior cholecystectomy with minimal intrahepatic biliary ductal  prominence. 3. Bilateral renal cysts. Suboptimal visualization of the left kidney.   Electronically Signed   By: Logan Bores M.D.   On: 01/24/2015 10:36   Dg Chest Port 1 View  01/23/2015   CLINICAL DATA:  Fever, mitral valve prolapse, atrial fibrillation and hypertension  EXAM: PORTABLE CHEST 1 VIEW  COMPARISON:  12/19/2012  FINDINGS: Left subclavian 2 lead pacer noted. Cardiomegaly evident with increased vascular and interstitial opacities compatible with early edema related CHF. No large effusion or pneumothorax. No focal pneumonia, collapse or consolidation. Degenerative changes of the spine and shoulders. Trachea is midline. Air within the upper thoracic esophagus noted. This projects over the tracheal air column.  IMPRESSION: Cardiomegaly with early interstitial edema pattern.   Electronically Signed   By: Jerilynn Mages.  Shick M.D.   On: 01/23/2015 10:55     Medical Consultants:    Dr. Lucio Edward, GI  Dr. Michel Bickers, ID  Anti-Infectives:   Anti-infectives    Start     Dose/Rate Route Frequency Ordered Stop   01/26/15 2000  ciprofloxacin (CIPRO) tablet 500 mg     500 mg Oral 2 times daily 01/26/15 1102     01/26/15 1400  metroNIDAZOLE (FLAGYL) tablet 500 mg     500 mg Oral 3 times per day 01/26/15 1102     01/26/15 1000  cefTRIAXone (ROCEPHIN) 2 g in dextrose 5 % 50 mL IVPB  Status:  Discontinued     2 g 100 mL/hr over 30 Minutes Intravenous Every 24 hours 01/26/15 0801 01/26/15 1102   01/25/15 1930  acyclovir (ZOVIRAX) tablet 400 mg     400 mg Oral 5 times daily 01/25/15 1921 02/01/15 1759   01/24/15 2100  piperacillin-tazobactam (ZOSYN) IVPB 3.375 g  Status:  Discontinued     3.375 g 12.5 mL/hr over 240 Minutes Intravenous Every 8 hours 01/24/15 1012 01/26/15 0801   01/24/15 1030  piperacillin-tazobactam (ZOSYN) IVPB 3.375 g     3.375 g 100 mL/hr over 30 Minutes Intravenous STAT 01/24/15 1010 01/24/15 1333   01/23/15 2300  cefTRIAXone (ROCEPHIN) 1 g in dextrose 5 % 50 mL IVPB   Status:  Discontinued     1 g 100 mL/hr over 30 Minutes Intravenous Every 24 hours 01/23/15 2255 01/24/15 4496      Subjective:   Corine Shelter had one episode of diarrhea yesterday, none today.  Some nausea but no vomiting.  Appetite is poor. No abdominal pain, but feels bloated.  Does not have any energy. Still has mouth/lip pain from herpetic outbreak, but denies odynophagia.  Objective:    Filed Vitals:   01/26/15 1351 01/26/15 2103 01/26/15 2145 01/27/15 0604  BP: 122/58 158/85 158/85 139/72  Pulse: 88 83 83 75  Temp: 98.7 F (37.1 C)  98.4 F (36.9 C) 98.2 F (36.8 C)  TempSrc: Oral  Oral Oral  Resp: 18  18 18   Height:      Weight:      SpO2: 98%  98% 99%    Intake/Output Summary (Last 24 hours) at 01/27/15 0804 Last data filed at 01/26/15 2217  Gross per 24 hour  Intake  765 ml  Output    800 ml  Net    -35 ml   Filed Weights   01/23/15 0400 01/23/15 1840 01/25/15 0659  Weight: 67.495 kg (148 lb 12.8 oz) 73.7 kg (162 lb 7.7 oz) 74.2 kg (163 lb 9.3 oz)    Exam: Gen:  NAD, pleasantly confused Cardiovascular:  HSIR, II/VI SEM Respiratory:  Lungs CTAB Gastrointestinal:  Abdomen soft, NT/ND, + BS Extremities:  Right foot slightly edematous compared to left   Data Reviewed:    Labs: Basic Metabolic Panel:  Recent Labs Lab 01/23/15 0042 01/26/15 0522 01/27/15 0438  NA 141 141 139  K 3.4* 3.3* 3.4*  CL 106 109 112*  CO2 26 24 23   GLUCOSE 78 90 82  BUN 25* 22* 19  CREATININE 1.15* 1.02* 0.89  CALCIUM 8.9 7.8* 7.7*   GFR Estimated Creatinine Clearance: 38.7 mL/min (by C-G formula based on Cr of 0.89). Liver Function Tests:  Recent Labs Lab 01/23/15 0042 01/26/15 0522 01/27/15 0438  AST 813* 49* 30  ALT 461* 200* 147*  ALKPHOS 214* 149* 129*  BILITOT 2.8* 1.6* 1.0  PROT 6.7 5.5* 5.4*  ALBUMIN 3.6 2.8* 2.7*    Recent Labs Lab 01/23/15 0042 01/23/15 0900 01/26/15 0522  LIPASE 1692* 720* 44   CBC:  Recent Labs Lab  01/23/15 0042 01/26/15 0522  WBC 14.4* 8.8  NEUTROABS 13.5*  --   HGB 12.3 9.6*  HCT 37.8 28.0*  MCV 112.5* 110.2*  PLT 144* 88*   Sepsis Labs:  Recent Labs Lab 01/23/15 0042 01/24/15 1041 01/24/15 1325 01/26/15 0522  PROCALCITON  --  21.54  --   --   WBC 14.4*  --   --  8.8  LATICACIDVEN  --  1.4 1.7  --    Microbiology Recent Results (from the past 240 hour(s))  Culture, blood (routine x 2)     Status: None   Collection Time: 01/23/15 10:21 AM  Result Value Ref Range Status   Specimen Description BLOOD RIGHT HAND  Final   Special Requests BOTTLES DRAWN AEROBIC AND ANAEROBIC 10CC  Final   Culture  Setup Time   Final    GRAM NEGATIVE RODS IN BOTH AEROBIC AND ANAEROBIC BOTTLES CRITICAL RESULT CALLED TO, READ BACK BY AND VERIFIED WITH: Eston Esters RN 0938 01/23/15 A BROWNING    Culture   Final    ESCHERICHIA COLI Performed at [facility]    Report Status 01/25/2015 FINAL  Final   Organism ID, Bacteria ESCHERICHIA COLI  Final      Susceptibility   Escherichia coli - MIC*    AMPICILLIN 8 SENSITIVE Sensitive     CEFAZOLIN <=4 SENSITIVE Sensitive     CEFEPIME <=1 SENSITIVE Sensitive     CEFTAZIDIME <=1 SENSITIVE Sensitive     CEFTRIAXONE <=1 SENSITIVE Sensitive     CIPROFLOXACIN <=0.25 SENSITIVE Sensitive     GENTAMICIN <=1 SENSITIVE Sensitive     IMIPENEM <=0.25 SENSITIVE Sensitive     TRIMETH/SULFA <=20 SENSITIVE Sensitive     AMPICILLIN/SULBACTAM 4 SENSITIVE Sensitive     PIP/TAZO <=4 SENSITIVE Sensitive     * ESCHERICHIA COLI  Culture, blood (routine x 2)     Status: None   Collection Time: 01/23/15 10:28 AM  Result Value Ref Range Status   Specimen Description BLOOD LEFT HAND  Final   Special Requests BOTTLES DRAWN AEROBIC AND ANAEROBIC 10CC  Final   Culture  Setup Time   Final    GRAM  NEGATIVE RODS IN BOTH AEROBIC AND ANAEROBIC BOTTLES CRITICAL RESULT CALLED TO, READ BACK BY AND VERIFIED WITH: Richard Miu DYKE RN 2254 01/23/15 A BROWNING    Culture    Final    ESCHERICHIA COLI SUSCEPTIBILITIES PERFORMED ON PREVIOUS CULTURE WITHIN THE LAST 5 DAYS. Performed at [facility]    Report Status 01/25/2015 FINAL  Final  Culture, Urine     Status: None   Collection Time: 01/23/15  4:06 PM  Result Value Ref Range Status   Specimen Description URINE, CLEAN CATCH  Final   Special Requests NONE  Final   Culture   Final    >=100,000 COLONIES/mL ESCHERICHIA COLI Performed at [facility]    Report Status 01/26/2015 FINAL  Final   Organism ID, Bacteria ESCHERICHIA COLI  Final      Susceptibility   Escherichia coli - MIC*    AMPICILLIN 8 SENSITIVE Sensitive     CEFAZOLIN <=4 SENSITIVE Sensitive     CEFTRIAXONE <=1 SENSITIVE Sensitive     CIPROFLOXACIN <=0.25 SENSITIVE Sensitive     GENTAMICIN <=1 SENSITIVE Sensitive     IMIPENEM <=0.25 SENSITIVE Sensitive     NITROFURANTOIN <=16 SENSITIVE Sensitive     TRIMETH/SULFA <=20 SENSITIVE Sensitive     AMPICILLIN/SULBACTAM 4 SENSITIVE Sensitive     PIP/TAZO <=4 SENSITIVE Sensitive     * >=100,000 COLONIES/mL ESCHERICHIA COLI  Culture, blood (x 2)     Status: None (Preliminary result)   Collection Time: 01/25/15 12:37 AM  Result Value Ref Range Status   Specimen Description BLOOD RIGHT ARM  Final   Special Requests BOTTLES DRAWN AEROBIC AND ANAEROBIC 5.5CC  Final   Culture   Final    NO GROWTH 1 DAY Performed at [facility]    Report Status PENDING  Incomplete  Culture, blood (x 2)     Status: None (Preliminary result)   Collection Time: 01/25/15 12:45 AM  Result Value Ref Range Status   Specimen Description BLOOD RIGHT ARM  Final   Special Requests BOTTLES DRAWN AEROBIC ONLY 10CC  Final   Culture   Final    NO GROWTH 1 DAY Performed at [facility]    Report Status PENDING  Incomplete     Medications:   . acyclovir  400 mg Oral 5 X Daily  . antiseptic oral rinse  7 mL Mouth Rinse q12n4p  . apixaban  2.5 mg Oral BID  . ciprofloxacin  500 mg Oral BID   . citalopram  20 mg Oral Daily  . conjugated estrogens  1 Applicatorful Vaginal Once per day on Mon Wed Fri  . feeding supplement (ENSURE ENLIVE)  237 mL Oral BID BM  . fentaNYL  75 mcg Transdermal Q72H  . folic acid  664 mcg Oral q morning - 10a  . furosemide  20 mg Oral Daily  . gabapentin  800 mg Oral QHS  . isosorbide mononitrate  15 mg Oral Daily  . levothyroxine  50 mcg Oral Daily  . magic mouthwash w/lidocaine  10 mL Oral QID  . metoprolol succinate  50 mg Oral BID  . metroNIDAZOLE  500 mg Oral 3 times per day  . pantoprazole  40 mg Oral Daily  . polyvinyl alcohol  1 drop Both Eyes TID  . potassium chloride  20 mEq Oral BID  . predniSONE  5 mg Oral Q breakfast   Continuous Infusions: . sodium chloride 75 mL/hr at 01/26/15 1941    Time spent: 35 minutes.  The patient is medically complex and requires high complexity decision making and coordination of care with multiple subspecialists.    LOS: 4 days   [facility] Pager [phone]. If unable to reach me by pager, please call my cell phone at [phone].  *Please refer to [email or web address], password [email or web address] to get updated schedule on who will round on this patient, as hospitalists switch teams weekly. If 7PM-7AM, please contact night-coverage at [email or web address], password [email or web address] for any overnight needs.  01/27/2015, 8:04 AM          \

## 2015-01-27 NOTE — Progress Notes (Signed)
Received call--pt not able to have MRCP due to pacemaker. Will follow LFTs. If not nearing normal in a few days, may need ERCP.  Carlean Jews Hvozdovic, PA-C [facility]

## 2015-01-27 NOTE — Progress Notes (Signed)
Physical Therapy Treatment Patient Details Name: Nicole Roberts MRN: [id number] DOB: 06-02-1921 Today's Date: 01/27/2015    History of Present Illness 81 yo female admitted with acute pancreatitis. Hx of pulm HTN, RA, breast cancer, TIA, spinal stenosis    PT Comments    Assisted pt from wheelchair to [facility] then from [facility] to recliner.  Used + 2 assist for safety and perform hygiene.  Pt present with loose stools.  Positioned in recliner to comfort.  Pt unable to tolerate B LE's in full extension due to B knee flex contractures.   Follow Up Recommendations  Other (comment) (ALF  [facility])     Equipment Recommendations       Recommendations for Other Services       Precautions / Restrictions Precautions Precautions: Fall Precaution Comments: wc bound Restrictions Weight Bearing Restrictions: No    Mobility  Bed Mobility Overal bed mobility: Needs Assistance Bed Mobility: Supine to Sit     Supine to sit: Mod assist;Max assist     General bed mobility comments: Pt OOB in wheelchair  Transfers Overall transfer level: Needs assistance Equipment used: None Transfers: Stand Pivot Transfers   Stand pivot transfers: Max assist;Total assist;+2 physical assistance;+2 safety/equipment       General transfer comment: assisted from wheelchair to [facility] then from [facility] to recliner. "Bear Hug" stand pivot.  Ambulation/Gait             General Gait Details: pt is non amb and wheelchair level   Stairs            Wheelchair Mobility    Modified Rankin (Stroke Patients Only)       Balance                                    Cognition Arousal/Alertness: Awake/alert (pleasant/funny) Behavior During Therapy: WFL for tasks assessed/performed Overall Cognitive Status: Within Functional Limits for tasks assessed                      Exercises      General Comments        Pertinent Vitals/Pain Pain Assessment: No/denies pain     Home Living                      Prior Function            PT Goals (current goals can now be found in the care plan section) Progress towards PT goals: Progressing toward goals    Frequency  Min 2X/week    PT Plan      Co-evaluation             End of Session   Activity Tolerance: Patient tolerated treatment well Patient left: Other (comment) (in wheelchair )     Time: 1545-1600 PT Time Calculation (min) (ACUTE ONLY): 15 min  Charges:  $Therapeutic Activity: 8-22 mins                     G Codes:      Nicole Roberts  PTA [facility]  Acute  Rehab Pager      [phone]

## 2015-01-27 NOTE — Progress Notes (Signed)
Physical Therapy Treatment Patient Details Name: Romonia Yanik MRN: [id number] DOB: 1921/07/14 Today's Date: 01/27/2015    History of Present Illness 81 yo female admitted with acute pancreatitis. Hx of pulm HTN, RA, breast cancer, TIA, spinal stenosis    PT Comments    Assisted OOB to wc stand pivot "bear Hug" tech x 3 attempts as pt was pushing posteriorly initially due to fear.  Pt states "the girls just pick me up and put me in the chair".  Pt lives at [place] with spouse who also is wheelchair level. Had pt perform manual chair pushing using B UE's approx 50 feet with min assist.  Pt states her chair is low enough that she can use her feet to propel short distances.  "But I have to have my shoes on".    Follow Up Recommendations  Other (comment) (ALF  [facility])     Equipment Recommendations       Recommendations for Other Services       Precautions / Restrictions Precautions Precautions: Fall Precaution Comments: wc bound Restrictions Weight Bearing Restrictions: No    Mobility  Bed Mobility Overal bed mobility: Needs Assistance Bed Mobility: Supine to Sit     Supine to sit: Mod assist;Max assist     General bed mobility comments: increased time plus use of rail and extra assist to scoot to EOB  Transfers Overall transfer level: Needs assistance Equipment used: None Transfers: Stand Pivot Transfers   Stand pivot transfers: Max assist;Total assist       General transfer comment: max/total assist 1/4 turn from elevated bed to wheelchair "bear Hug" tech  Ambulation/Gait             General Gait Details: pt is non amb and wheelchair level   Stairs            Wheelchair Mobility    Modified Rankin (Stroke Patients Only)       Balance                                    Cognition Arousal/Alertness: Awake/alert (pleasant/funny) Behavior During Therapy: WFL for tasks assessed/performed Overall Cognitive Status: Within  Functional Limits for tasks assessed                      Exercises      General Comments        Pertinent Vitals/Pain Pain Assessment: No/denies pain    Home Living                      Prior Function            PT Goals (current goals can now be found in the care plan section) Progress towards PT goals: Progressing toward goals    Frequency  Min 2X/week    PT Plan      Co-evaluation             End of Session   Activity Tolerance: Patient tolerated treatment well Patient left: Other (comment) (in wheelchair )     Time: 2536-6440 PT Time Calculation (min) (ACUTE ONLY): 25 min  Charges:  $Therapeutic Activity: 8-22 mins $Wheel Chair Management: 8-22 mins                    G Codes:      Rica Koyanagi  PTA [facility]  Acute  Rehab Pager  [phone]  

## 2015-01-27 NOTE — Consult Note (Addendum)
Referring Provider:   [facility] Primary Care Physician:  Gwendolyn Grant, MD Primary Gastroenterologist:  Dr. Fuller Plan  Reason for Consultation:  duodenitis    HPI: Nicole Roberts is a 81 y.o. female  who is known to Dr. Fuller Plan from prior endoscopic evaluation. She has a history of atrial fibrillation for which she is on Eliquis, history of TIAs in January 2013, breast cancer with lumpectomy in 2008, rheumatoid arthritis, pulmonary hypertension, sick sinus syndrome status post pacemaker placement, chronic kidney disease, mitral valve insufficiency and aortic valve insufficiency, hyperlipidemia, GERD, hypothyroidism, hyperparathyroidism, venous insufficiency with chronic bilateral lower extremity edema, and spinal stenosis. She was admitted to the hospital in September 2030 after presenting to the emergency room with abdominal pain of several days' duration. She describes the pain as located in the epigastric area. She says the pain came on acutely several hours after eating a meal and was white severe. She had nausea but no vomiting. Patient had been on Protonix for several years prior to this admission and was still taking it. She reports that over the past month she has had increased frequency of belching, burping, and  "sour stomach". She has been constipated. CT of the abdomen and pelvis in the emergency room revealed probable thickening of the duodenal wall. There is infiltration in the fat around the head of the pancreas and duodenal bulb reason this could represent inflammation due to pancreatitis or duodenitis. No pancreatic ductal dilatation. Mild dilatation of the duodenum and proximal small bowel without transition zone. This is likely ileus related to inflammatory process. Abdominal ultrasound revealed the visualized portion of the pancreas was unremarkable the distal body and tail were obscured. Spleen was normal in size and appearance. Prior cholecystectomy with minimal intrahepatic  biliary ductal prominence was noted. Admission labs showed total bili of 2.8 ALT 461 AST 813 lipase 1692, alkaline phosphatase 214. Later that day on September 23 her lipase was 720. Repeat laboratory studies on September 26 showed a total bili of 1.6. ALT 200, AST 49, lipase 44, alkaline phosphatase 149. Laboratory studies today showed a total bili 1.0, ALT 147, AST 30, alkaline phosphatase 129. White blood count on admission was 14.4, white blood count on September 26 was 8.8. Hemoglobin on admission was 12.3 and history of 2-9.6. Patient denies bright red blood per rectum. She states her stools have been loose today but she has not noticed if they're dark because she does not look. EGD in June 2014 revealed white exudates consistent with candidiasis, and a mild gastritis. This current hospital admission is also noted to include Escherichia coli bacteremia for which she was started on Zosyn, which was changed to Cipro and Flagyl on September 26.   Past Medical History  Diagnosis Date  . HEARING LOSS   . MITRAL VALVE INSUFF&AORTIC VALVE INSUFF     moderate MR  . PULMONARY HYPERTENSION   . Atrial fibrillation     permanent  . SICK SINUS SYNDROME     a. Tachybrady syndrome - [facility] PPM 10/2005.  . Edema 03/19/2009    due to venous insufficiency, chronic lymphedema  . URINARY INCONTINENCE   . Rheumatoid arthritis(714.0) dx clarified 2011    On prednisone  . BREAST CANCER 12/2006    ductal ca s/p L lumpectomy  . Diastolic dysfunction   . HYPERTENSION   . HYPERLIPIDEMIA   . GERD   . HYPOTHYROIDISM   . Hyperparathyroidism     2 lobes removed  . Venous insufficiency  chronic BLE edema  . Spinal stenosis   . Anemia     a. Macrocytic - normal B12, folate 08/2011. b. 3/6 heme positive stools 10/2011 - per PCP note, elected against colonscopy.  [name] TIA (transient ischemic attack) 05/2011  . Anxiety     Past Surgical History  Procedure Laterality Date  . Pacemaker placement  2005    SSS and  syncope  . Cholecystectomy  04/06/99  . Abdominal hysterectomy  1970    Partial  . Left hip arthroscopy  1995  . Right hip arthroscopy  01/2001  . Carpal tunnel release  1998    bilateral  . Breast surgery  12/2006    Left breast lupectomy- Ductal CA  . Left parathyroidectomy  07/2008  . Esophagogastroduodenoscopy N/A 10/18/2012    Procedure: ESOPHAGOGASTRODUODENOSCOPY (EGD);  Surgeon: Ladene Artist, MD;  Location: [facility];  Service: Endoscopy;  Laterality: N/A;    Prior to Admission medications   Medication Sig Start Date End Date Taking? Authorizing Provider  alum & mag hydroxide-simeth (MAALOX/MYLANTA) 200-200-20 MG/5ML suspension Take 30 mLs by mouth every 6 (six) hours as needed for indigestion or heartburn.   Yes Historical Provider, MD  apixaban (ELIQUIS) 2.5 MG TABS tablet Take 1 tablet (2.5 mg total) by mouth 2 (two) times daily. 03/08/13  Yes Thompson Grayer, MD  Artificial Tear Ointment (ARTIFICIAL TEARS) ointment Place 1 drop into both eyes 3 (three) times daily.   Yes Historical Provider, MD  atorvastatin (LIPITOR) 10 MG tablet Take 10 mg by mouth at bedtime.   Yes Historical Provider, MD  calcium citrate-vitamin D (CITRACAL+D) 315-200 MG-UNIT per tablet Take 1 tablet by mouth 2 (two) times daily.   Yes Historical Provider, MD  cholecalciferol (VITAMIN D) 1000 UNITS tablet Take 1,000 Units by mouth daily.   Yes Historical Provider, MD  citalopram (CELEXA) 20 MG tablet Take 20 mg by mouth daily.   Yes Historical Provider, MD  conjugated estrogens (PREMARIN) vaginal cream Place 1 Applicatorful vaginally 3 (three) times a week.   Yes Historical Provider, MD  fentaNYL (DURAGESIC - DOSED MCG/HR) 75 MCG/HR Place 1 patch (75 mcg total) onto the skin every 3 (three) days. 12/29/14  Yes Olga Millers, MD  folic acid (FOLVITE) 109 MCG tablet Take 400 mcg by mouth every morning.   Yes Historical Provider, MD  furosemide (LASIX) 20 MG tablet Take 1 tablet (20 mg total) by mouth daily.  07/10/14  Yes Rowe Clack, MD  gabapentin (NEURONTIN) 400 MG capsule Take 2 capsules (800 mg total) by mouth at bedtime. 12/02/14  Yes Rowe Clack, MD  HYDROcodone-acetaminophen (NORCO/VICODIN) 5-325 MG per tablet Take 1 tablet by mouth every 6 (six) hours as needed for moderate pain.   Yes Historical Provider, MD  isosorbide mononitrate (IMDUR) 30 MG 24 hr tablet Take 15 mg by mouth daily.   Yes Historical Provider, MD  levothyroxine (SYNTHROID, LEVOTHROID) 50 MCG tablet Take 1 tablet (50 mcg total) by mouth daily. 06/09/14  Yes Rowe Clack, MD  metoprolol succinate (TOPROL-XL) 50 MG 24 hr tablet Take 1 tablet (50 mg total) by mouth 2 (two) times daily. Take with or immediately following a meal. 06/02/14  Yes Rowe Clack, MD  Multiple Vitamin (MULTIVITAMIN) LIQD Take 5 mLs by mouth daily.   Yes Historical Provider, MD  nitroGLYCERIN (NITROSTAT) 0.4 MG SL tablet Place 0.4 mg under the tongue every 5 (five) minutes x 3 doses as needed for chest pain. 03/20/12  Yes Roger A  Arguello, PA-C  ondansetron (ZOFRAN) 4 MG tablet Take 1 tablet (4 mg total) by mouth every 12 (twelve) hours as needed for nausea. For nausea 02/25/13  Yes Rowe Clack, MD  pantoprazole (PROTONIX) 40 MG tablet Take 40 mg by mouth daily.   Yes Historical Provider, MD  potassium chloride (K-DUR) 10 MEQ tablet TAKE ONE TABLET BY MOUTH ONCE DAILY. 12/20/13  Yes Rowe Clack, MD  predniSONE (DELTASONE) 5 MG tablet Take 5 mg by mouth daily with breakfast.   Yes Historical Provider, MD  sucralfate (CARAFATE) 1 GM/10ML suspension Take 1 g by mouth 2 (two) times daily.   Yes Historical Provider, MD  vitamin B-12 (CYANOCOBALAMIN) 500 MCG tablet TAKE ONE TABLET BY MOUTH ONCE DAILY. 11/20/13  Yes Rowe Clack, MD  zoster vaccine live, PF, (ZOSTAVAX) 93235 UNT/0.65ML injection Inject 19,400 Units into the skin once. 12/02/14   Rowe Clack, MD    Current Facility-Administered Medications  Medication  Dose Route Frequency Provider Last Rate Last Dose  . 0.9 %  sodium chloride infusion   Intravenous Continuous Robbie Lis, MD 75 mL/hr at 01/26/15 1941    . acyclovir (ZOVIRAX) tablet 400 mg  400 mg Oral 5 X Daily Robbie Lis, MD   400 mg at 01/26/15 2103  . antiseptic oral rinse (CPC / CETYLPYRIDINIUM CHLORIDE 0.05%) solution 7 mL  7 mL Mouth Rinse q12n4p Robbie Lis, MD   7 mL at 01/26/15 1600  . apixaban (ELIQUIS) tablet 2.5 mg  2.5 mg Oral BID Etta Quill, DO   2.5 mg at 01/26/15 2103  . ciprofloxacin (CIPRO) tablet 500 mg  500 mg Oral BID Venetia Maxon Rama, MD   500 mg at 01/27/15 0657  . citalopram (CELEXA) tablet 20 mg  20 mg Oral Daily Etta Quill, DO   20 mg at 01/26/15 1003  . conjugated estrogens (PREMARIN) vaginal cream 1 Applicatorful  1 Applicatorful Vaginal Once per day on Mon Wed Fri Etta Quill, DO   1 Applicatorful at 57/32/20 1002  . feeding supplement (ENSURE ENLIVE) (ENSURE ENLIVE) liquid 237 mL  237 mL Oral BID BM Clayton Bibles, RD   237 mL at 01/26/15 1400  . fentaNYL (DURAGESIC - dosed mcg/hr) 75 mcg  75 mcg Transdermal Q72H Robbie Lis, MD   75 mcg at 01/26/15 1000  . folic acid (FOLVITE) tablet 0.5 mg  500 mcg Oral q morning - 10a Etta Quill, DO   0.5 mg at 01/26/15 1003  . furosemide (LASIX) tablet 20 mg  20 mg Oral Daily Robbie Lis, MD   20 mg at 01/26/15 1004  . gabapentin (NEURONTIN) capsule 800 mg  800 mg Oral QHS Etta Quill, DO   400 mg at 01/26/15 2103  . isosorbide mononitrate (IMDUR) 24 hr tablet 15 mg  15 mg Oral Daily Etta Quill, DO   15 mg at 01/26/15 1000  . levothyroxine (SYNTHROID, LEVOTHROID) tablet 50 mcg  50 mcg Oral Daily Etta Quill, DO   50 mcg at 01/26/15 0813  . magic mouthwash w/lidocaine  10 mL Oral QID Venetia Maxon Rama, MD   10 mL at 01/26/15 1937  . metoprolol succinate (TOPROL-XL) 24 hr tablet 50 mg  50 mg Oral BID Etta Quill, DO   50 mg at 01/26/15 2103  . metroNIDAZOLE (FLAGYL) tablet 500 mg  500 mg  Oral 3 times per day Venetia Maxon Rama, MD   500 mg at 01/27/15  7371  . morphine 2 MG/ML injection 2-4 mg  2-4 mg Intravenous Q4H PRN Etta Quill, DO   2 mg at 01/24/15 0626  . pantoprazole (PROTONIX) EC tablet 40 mg  40 mg Oral Daily Etta Quill, DO   40 mg at 01/26/15 1003  . polyvinyl alcohol (LIQUIFILM TEARS) 1.4 % ophthalmic solution 1 drop  1 drop Both Eyes TID Etta Quill, DO   1 drop at 01/24/15 2200  . potassium chloride SA (K-DUR,KLOR-CON) CR tablet 20 mEq  20 mEq Oral TID Venetia Maxon Rama, MD      . predniSONE (DELTASONE) tablet 5 mg  5 mg Oral Q breakfast Etta Quill, DO   5 mg at 01/26/15 0813    Allergies as of 01/22/2015 - Review Complete 01/22/2015  Allergen Reaction Noted  . Sulfa antibiotics Itching 09/06/2010  . Tramadol hcl Itching and Rash     Family History  Problem Relation Age of Onset  . Ovarian cancer Mother   . Coronary artery disease Sister   . Coronary artery disease Brother   . Hypertension Mother     grandparent  . Lung cancer Father     Social History   Social History  . Marital Status: Married    Spouse Name: N/A  . Number of Children: 6  . Years of Education: N/A   Occupational History  . Retired Agricultural engineer    Social History Main Topics  . Smoking status: Never Smoker   . Smokeless tobacco: Never Used  . Alcohol Use: No  . Drug Use: No  . Sexual Activity: Not Currently   Other Topics Concern  . Not on file   Social History Narrative   L[facility] at [facility] since 03/2009- married, lives with spouse. Moved here from [facility] to be near son    Review of Systems: Gen: Denies any fever, chills, sweats. Has difficulty sleeping. CV: Denies chest pain, angina, palpitations, syncope, orthopnea. Resp: Denies dyspnea at rest, dyspnea with exercise, cough, sputum, wheezing, coughing up blood, and pleurisy. GI: Denies vomiting blood, jaundice, and fecal incontinence.   Denies dysphagia or odynophagia. GU : Denies  urinary burning, blood in urine, urinary frequency, urinary hesitancy, nocturnal urination, and urinary incontinence. MS: Admits to joint stiffness, history of rheumatoid arthritis Derm: Denies rash, itching, dry skin, hives, moles, warts, or unhealing ulcers.  Psych: Denies depression, anxiety, memory loss, suicidal ideation, hallucinations, paranoia, and confusion. Heme: Denies bruising, bleeding, and enlarged lymph nodes. Neuro:  Denies any headaches, dizziness, paresthesias.   Physical Exam: Vital signs in last 24 hours: Temp:  [98.2 F (36.8 C)-98.7 F (37.1 C)] 98.2 F (36.8 C) (09/27 0604) Pulse Rate:  [75-88] 75 (09/27 0604) Resp:  [18] 18 (09/27 0604) BP: (122-158)/(58-85) 139/72 mmHg (09/27 0604) SpO2:  [98 %-99 %] 99 % (09/27 0604) Last BM Date: 01/26/15 General:   Alert, pleasantly confused, elderly Caucasian female in no apparent distress Head:  Normocephalic and atraumatic. Eyes:  Sclera clear, no icterus. Conjunctiva pink. Ears:  Normal auditory acuity. Nose:  No deformity, discharge,  or lesions. Mouth:  No deformity or lesions.   Neck:  Supple; no masses or thyromegaly. Lungs:  Clear throughout to auscultation.     Heart:  2/6 systolic ejection murmur noted Abdomen:  Soft, mild epigastric tenderness to palpation with no rebound or guarding BS active,nonpalp mass or hsm.   Rectal: Loose brown stool, Hemoccult-positive Msk:  Symmetrical without gross deformities. . Pulses:  Normal pulses noted. Extremities: 1-2+  edema lower extremities Neurologic: Alert, pleasantly confused Skin: Intact without significant lesions or rashes..   Intake/Output from previous day: 09/26 0701 - 09/27 0700 In: 885 [P.O.:360; I.V.:525] Out: 900 [Urine:900] Intake/Output this shift:    Lab Results:  Recent Labs  01/26/15 0522 01/27/15 0835  WBC 8.8 8.0  HGB 9.6* 10.4*  HCT 28.0* 31.2*  PLT 88* 102*   BMET  Recent Labs  01/26/15 0522 01/27/15 0438  NA 141 139  K  3.3* 3.4*  CL 109 112*  CO2 24 23  GLUCOSE 90 82  BUN 22* 19  CREATININE 1.02* 0.89  CALCIUM 7.8* 7.7*   LFT  Recent Labs  01/27/15 0438  PROT 5.4*  ALBUMIN 2.7*  AST 30  ALT 147*  ALKPHOS 129*  BILITOT 1.0  lipase 01/23/15  1692, 01/23/15 720, 01/26/15 44.   Studies/Results: ULTRASOUND ABDOMEN COMPLETE  COMPARISON: CT abdomen and pelvis 01/23/2015  FINDINGS: Gallbladder: Surgically absent.  Common bile duct: Diameter: 6 mm  Liver: No focal lesion identified. Within normal limits in parenchymal echogenicity. Slight intrahepatic biliary ductal prominence likely related to prior cholecystectomy.  IVC: No abnormality visualized.  Pancreas: Visualized portion unremarkable. Distal body and tail obscured.  Spleen: Size and appearance within normal limits.  Right Kidney: Length: 9.5 cm. Echogenicity within normal limits. No solid mass or hydronephrosis visualized. 4.2 cm interpolar cyst.  Left Kidney: Length: 9.8 cm. 5.4 cm interpolar cyst, with poor visualization of the remainder of the renal parenchyma.  Abdominal aorta: No aneurysm visualized.  Other findings: None.  IMPRESSION: 1. No acute abnormality identified. 2. Prior cholecystectomy with minimal intrahepatic biliary ductal prominence. 3. Bilateral renal cysts. Suboptimal visualization of the left kidney.   Electronically Signed  By: Logan Bores M.D.  On: 01/24/2015 10:36      Show images for CT Abdomen Pelvis Wo Contrast     Study Result     CLINICAL DATA: Severe mid abdominal pain intermittent for a few days. Epigastric pain. Nausea. Decreased bowel movements. Oral contrast only per ordering physician.  EXAM: CT ABDOMEN AND PELVIS WITHOUT CONTRAST  TECHNIQUE: Multidetector CT imaging of the abdomen and pelvis was performed following the standard protocol without IV contrast.  COMPARISON: CT chest abdomen and pelvis 06/03/2011  FINDINGS: Examination of  solid organs and vascular structures is limited without IV contrast material. Evaluation of bowel structures is limited without oral contrast material.  Dependent atelectasis in the lung bases. Residual contrast material in the lower esophagus may indicate reflux or dysmotility.  Surgical absence of the gallbladder. The unenhanced appearance of the liver, spleen, adrenal glands, inferior vena cava, and retroperitoneal lymph nodes is unremarkable. Large exophytic lesions in both kidneys consistent with cysts as seen on previous study. No hydronephrosis in either kidney. 2 mm stone in the midportion left kidney. There is infiltration in the fat around the head of the pancreas and duodenal bulb region. Probable thickening of the duodenal wall. This could represent inflammation due to pancreatitis or duodenitis. No pancreatic ductal dilatation. Mild dilatation of the duodenum and proximal small bowel without transition zone. This is likely ileus related to the inflammatory process. No definite obstruction. No evidence of abscess or perforation. No free air in the abdomen. Given the wall thickening in the duodenum, recommend follow-up after resolution of acute process to exclude an underlying duodenum neoplasm.  Pelvis: Small umbilical hernia containing fat. The appendix is normal. Evaluation of the low pelvis is limited due to streak artifact from bilateral hip arthroplasties. No gross pelvic  mass or lymphadenopathy. Visualized bladder wall is not thickened. No evidence of diverticulitis. Degenerative changes throughout the lumbar spine. No destructive bone lesions.  IMPRESSION: Inflammatory changes in the upper abdomen centered around the head of the pancreas and duodenum bulb region. This could be due to pancreatitis or inflammation from duodenitis or peptic ulcer disease. Mild dilatation of the duodenum probably represents reactive ileus. Followup after resolution of acute  process recommended to exclude underlying duodenum neoplasm.   Electronically Signed  By: Lucienne Capers M.D.  On: 01/23/2015 02:17     IMPRESSION/PLAN:  81 year old female with prior history of gastritis admitted with epigastric pain, found to have pancreatitis as well as duodenitis. Laboratory studies revealed an elevation of her lipase that has  trickled down, but also showed an elevation of her LFTs as well. This raises the question as to whether or not she may have passed a small stone or sludge. Would continue PPI. Will obtain MRCP. Would recommend holding eliquis effective today in anticipation of possible EGD or ERCP in the next few days. Will recheck CMET, CBC and lipase tomorrow a.m. (Reviewed pt with Dr Rama--eliquis d/c'd)  Roberts, Nicole Ping 01/27/2015,  Pager [phone]     Attending physician's note   I have taken a history, examined the patient and reviewed the chart. I agree with the Advanced Practitioner's note, impression and recommendations. Acute pancreatitis, suspected biliary etiology. R/O CBD stone(s). Schedule MRCP. Continue bowel rest and follow CMP, CBC, lipase. Duodenal bulb findings could be secondary to pancreatitis or from duodenitis/ulcer/etc. Continue PPI. Consider EGD to further evaluate. Hold Eliquis for now anticipating that ERCP or EGD might be necessary in the next 1-2 days.  Addendum: Radiology called and her type of pacemaker is not compatible with an MRI so will need to consider ERCP if LFTs do not rapidly trend toward normal.   Ladene Artist, MD [facility] [phone] Mon-Fri 8a-5p [phone] Mon-Fri 5p-8a, weekends, holidays or per [facility]

## 2015-01-27 NOTE — Consult Note (Signed)
[facility] for [facility] Disease    Date of Admission:  01/22/2015   Total days of antibiotics 5        Day 2 ciprofloxacin        Day 2 metronidazole              Reason for Consult: Escherichia coli bacteremia    Referring Physician: Dr. Gerald Stabs Rama  Principal Problem:   Bacteremia due to Escherichia coli Active Problems:   Sepsis due to Escherichia coli (E. coli)   Acute pancreatitis   Duodenitis   Hypothyroidism   Dyslipidemia   Essential hypertension   GERD   Rheumatoid arthritis   Atrial fibrillation   Stage III chronic kidney disease   Depression   Abdominal pain, epigastric   Hypokalemia   Leukocytosis   Abnormal LFTs (liver function tests)   Neuropathy   Chronic combined systolic and diastolic CHF (congestive heart failure)   UTI (urinary tract infection)   Thrombocytopenia   Protein-calorie malnutrition, moderate   Wheelchair bound   Acute encephalopathy   Recurrent cold sores   Edema leg   . acyclovir  400 mg Oral 5 X Daily  . antiseptic oral rinse  7 mL Mouth Rinse q12n4p  . ciprofloxacin  500 mg Oral BID  . citalopram  20 mg Oral Daily  . conjugated estrogens  1 Applicatorful Vaginal Once per day on Mon Wed Fri  . feeding supplement (ENSURE ENLIVE)  237 mL Oral BID BM  . fentaNYL  75 mcg Transdermal Q72H  . folic acid  536 mcg Oral q morning - 10a  . furosemide  20 mg Oral Daily  . gabapentin  800 mg Oral QHS  . isosorbide mononitrate  15 mg Oral Daily  . levothyroxine  50 mcg Oral Daily  . magic mouthwash w/lidocaine  10 mL Oral QID  . metoprolol succinate  50 mg Oral BID  . metroNIDAZOLE  500 mg Oral 3 times per day  . pantoprazole  40 mg Oral Daily  . polyvinyl alcohol  1 drop Both Eyes TID  . potassium chloride  20 mEq Oral TID  . predniSONE  5 mg Oral Q breakfast    Recommendations: 1. Narrow antibiotic therapy to oral amoxicillin and treat for 9 more days 2. Check stool for C. difficile screen   Assessment: Ms.  Mokry had Escherichia coli bacteremia. I suspect that it is related to her acute pancreatitis and duodenal inflammation. She probably had translocation of gut bacteria. She is much improved with her initial antibiotic therapy. I will complete antibiotic therapy with 9 more days of oral amoxicillin. Since she has developed diarrhea since being hospitalized I will screen her for C. difficile infection.    HPI: Nicole Roberts is a 81 y.o. female who developed sudden, severe epigastric pain associated with low-grade fever, nausea, dyspepsia and constipation leading to admission on 01/22/2015. She had a temperature of 100.8 with elevated liver enzymes and lipase. CT scan revealed evidence of pancreatitis with adjacent duodenitis. There was no abscess noted. Admission blood cultures have grown Escherichia coli. Urine cultures grew the same Escherichia coli. She has not had any urinary tract symptoms. She was started on broad empiric antibiotic therapy. She has defervesced. She is feeling much better. She no longer has any abdominal pain and her liver enzymes and lipase are returning to normal. Her nausea has resolved but she has had diarrhea since admission.   Review of Systems: Constitutional:  positive for fevers, negative for anorexia, chills, sweats and weight loss Eyes: negative Ears, nose, mouth, throat, and face: negative Respiratory: negative Cardiovascular: negative Gastrointestinal: positive for abdominal pain, change in bowel habits, constipation, diarrhea, dyspepsia and nausea, negative for vomiting Genitourinary:negative for dysuria and frequency Integument/breast: negative Hematologic/lymphatic: negative Musculoskeletal:negative  Past Medical History  Diagnosis Date  . HEARING LOSS   . MITRAL VALVE INSUFF&AORTIC VALVE INSUFF     moderate MR  . PULMONARY HYPERTENSION   . Atrial fibrillation     permanent  . SICK SINUS SYNDROME     a. Tachybrady syndrome - Guidant PPM 10/2005.  .  Edema 03/19/2009    due to venous insufficiency, chronic lymphedema  . URINARY INCONTINENCE   . Rheumatoid arthritis(714.0) dx clarified 2011    On prednisone  . BREAST CANCER 12/2006    ductal ca s/p L lumpectomy  . Diastolic dysfunction   . HYPERTENSION   . HYPERLIPIDEMIA   . GERD   . HYPOTHYROIDISM   . Hyperparathyroidism     2 lobes removed  . Venous insufficiency     chronic BLE edema  . Spinal stenosis   . Anemia     a. Macrocytic - normal B12, folate 08/2011. b. 3/6 heme positive stools 10/2011 - per PCP note, elected against colonscopy.  [name] TIA (transient ischemic attack) 05/2011  . Anxiety     Social History  Substance Use Topics  . Smoking status: Never Smoker   . Smokeless tobacco: Never Used  . Alcohol Use: No    Family History  Problem Relation Age of Onset  . Ovarian cancer Mother   . Coronary artery disease Sister   . Coronary artery disease Brother   . Hypertension Mother     grandparent  . Lung cancer Father    Allergies  Allergen Reactions  . Sulfa Antibiotics Itching  . Tramadol Hcl Itching and Rash    OBJECTIVE: Blood pressure 141/62, pulse 79, temperature 98.1 F (36.7 C), temperature source Oral, resp. rate 18, height 5\' 1"  (1.549 m), weight 163 lb 9.3 oz (74.2 kg), SpO2 100 %. General: She is alert and in no distress. She is very pleasant and talkative. She is sitting up in a chair Skin: No rash Hearing: She is moderately hard of hearing Oral: No oropharyngeal lesions. Some missing teeth Lungs: Clear Cor: Regular S1 and S2 with no murmurs Pacemaker: Left anterior chest pacemaker site appears normal Abdomen: Soft and nontender with no palpable masses. She has active bowel sounds Joints and extremities: No acute abnormalities Mood: Normal. She does not appear anxious or depressed  Lab Results Lab Results  Component Value Date   WBC 8.0 01/27/2015   HGB 10.4* 01/27/2015   HCT 31.2* 01/27/2015   MCV 111.0* 01/27/2015   PLT 102*  01/27/2015    Lab Results  Component Value Date   CREATININE 0.89 01/27/2015   BUN 19 01/27/2015   NA 139 01/27/2015   K 3.4* 01/27/2015   CL 112* 01/27/2015   CO2 23 01/27/2015    Lab Results  Component Value Date   ALT 147* 01/27/2015   AST 30 01/27/2015   ALKPHOS 129* 01/27/2015   BILITOT 1.0 01/27/2015     Microbiology: Recent Results (from the past 240 hour(s))  Culture, blood (routine x 2)     Status: None   Collection Time: 01/23/15 10:21 AM  Result Value Ref Range Status   Specimen Description BLOOD RIGHT HAND  Final   Special Requests BOTTLES DRAWN  AEROBIC AND ANAEROBIC 10CC  Final   Culture  Setup Time   Final    GRAM NEGATIVE RODS IN BOTH AEROBIC AND ANAEROBIC BOTTLES CRITICAL RESULT CALLED TO, READ BACK BY AND VERIFIED WITH: Eston Esters RN [id number] 01/23/15 A BROWNING    Culture   Final    ESCHERICHIA COLI Performed at [facility]    Report Status 01/25/2015 FINAL  Final   Organism ID, Bacteria ESCHERICHIA COLI  Final      Susceptibility   Escherichia coli - MIC*    AMPICILLIN 8 SENSITIVE Sensitive     CEFAZOLIN <=4 SENSITIVE Sensitive     CEFEPIME <=1 SENSITIVE Sensitive     CEFTAZIDIME <=1 SENSITIVE Sensitive     CEFTRIAXONE <=1 SENSITIVE Sensitive     CIPROFLOXACIN <=0.25 SENSITIVE Sensitive     GENTAMICIN <=1 SENSITIVE Sensitive     IMIPENEM <=0.25 SENSITIVE Sensitive     TRIMETH/SULFA <=20 SENSITIVE Sensitive     AMPICILLIN/SULBACTAM 4 SENSITIVE Sensitive     PIP/TAZO <=4 SENSITIVE Sensitive     * ESCHERICHIA COLI  Culture, blood (routine x 2)     Status: None   Collection Time: 01/23/15 10:28 AM  Result Value Ref Range Status   Specimen Description BLOOD LEFT HAND  Final   Special Requests BOTTLES DRAWN AEROBIC AND ANAEROBIC 10CC  Final   Culture  Setup Time   Final    GRAM NEGATIVE RODS IN BOTH AEROBIC AND ANAEROBIC BOTTLES CRITICAL RESULT CALLED TO, READ BACK BY AND VERIFIED WITH: Richard Miu DYKE RN [id number] 01/23/15 A BROWNING    Culture    Final    ESCHERICHIA COLI SUSCEPTIBILITIES PERFORMED ON PREVIOUS CULTURE WITHIN THE LAST 5 DAYS. Performed at [facility]    Report Status 01/25/2015 FINAL  Final  Culture, Urine     Status: None   Collection Time: 01/23/15  4:06 PM  Result Value Ref Range Status   Specimen Description URINE, CLEAN CATCH  Final   Special Requests NONE  Final   Culture   Final    >=100,000 COLONIES/mL ESCHERICHIA COLI Performed at [facility]    Report Status 01/26/2015 FINAL  Final   Organism ID, Bacteria ESCHERICHIA COLI  Final      Susceptibility   Escherichia coli - MIC*    AMPICILLIN 8 SENSITIVE Sensitive     CEFAZOLIN <=4 SENSITIVE Sensitive     CEFTRIAXONE <=1 SENSITIVE Sensitive     CIPROFLOXACIN <=0.25 SENSITIVE Sensitive     GENTAMICIN <=1 SENSITIVE Sensitive     IMIPENEM <=0.25 SENSITIVE Sensitive     NITROFURANTOIN <=16 SENSITIVE Sensitive     TRIMETH/SULFA <=20 SENSITIVE Sensitive     AMPICILLIN/SULBACTAM 4 SENSITIVE Sensitive     PIP/TAZO <=4 SENSITIVE Sensitive     * >=100,000 COLONIES/mL ESCHERICHIA COLI  Culture, blood (x 2)     Status: None (Preliminary result)   Collection Time: 01/25/15 12:37 AM  Result Value Ref Range Status   Specimen Description BLOOD RIGHT ARM  Final   Special Requests BOTTLES DRAWN AEROBIC AND ANAEROBIC 5.5CC  Final   Culture   Final    NO GROWTH 2 DAYS Performed at [facility]    Report Status PENDING  Incomplete  Culture, blood (x 2)     Status: None (Preliminary result)   Collection Time: 01/25/15 12:45 AM  Result Value Ref Range Status   Specimen Description BLOOD RIGHT ARM  Final   Special Requests BOTTLES DRAWN AEROBIC ONLY 10CC  Final   Culture   Final    NO GROWTH 2 DAYS Performed at [facility]    Report Status PENDING  Incomplete    Michel Bickers, MD [facility] for [place] [facility] [phone] pager   [phone] cell 01/27/2015, 4:27 PM

## 2015-01-27 NOTE — Progress Notes (Signed)
*  Preliminary Results* Right lower extremity venous duplex completed. Right lower extremity is negative for deep vein thrombosis. There is evidence of multiple varicosities of the right lower extremity, some of which are thrombosed. There is no evidence of right Baker's cyst.   Preliminary results discussed with Dr. Rockne Menghini.  01/27/2015 1:52 PM  Maudry Mayhew, RVT, RDCS, RDMS

## 2015-01-27 NOTE — Progress Notes (Signed)
SLP Cancellation Note  Patient Details Name: Nicole Roberts MRN: [id number] DOB: 11-Dec-1921   Cancelled treatment:       Reason Eval/Treat Not Completed: Patient at procedure or test/unavailable   Luanna Salk, [facility] SLP [phone]

## 2015-01-28 DIAGNOSIS — R7989 Other specified abnormal findings of blood chemistry: Secondary | ICD-10-CM | POA: Insufficient documentation

## 2015-01-28 DIAGNOSIS — R945 Abnormal results of liver function studies: Secondary | ICD-10-CM | POA: Insufficient documentation

## 2015-01-28 DIAGNOSIS — G909 Disorder of the autonomic nervous system, unspecified: Secondary | ICD-10-CM

## 2015-01-28 LAB — COMPREHENSIVE METABOLIC PANEL
ALBUMIN: 2.8 g/dL — AB (ref 3.5–5.0)
ALK PHOS: 131 U/L — AB (ref 38–126)
ALT: 129 U/L — AB (ref 14–54)
AST: 25 U/L (ref 15–41)
Anion gap: 7 (ref 5–15)
BUN: 20 mg/dL (ref 6–20)
CALCIUM: 8.5 mg/dL — AB (ref 8.9–10.3)
CHLORIDE: 111 mmol/L (ref 101–111)
CO2: 23 mmol/L (ref 22–32)
CREATININE: 0.87 mg/dL (ref 0.44–1.00)
GFR calc non Af Amer: 57 mL/min — ABNORMAL LOW (ref >60)
GLUCOSE: 85 mg/dL (ref 65–99)
Potassium: 4.1 mmol/L (ref 3.5–5.1)
Sodium: 141 mmol/L (ref 135–145)
Total Bilirubin: 1 mg/dL (ref 0.3–1.2)
Total Protein: 5.9 g/dL — ABNORMAL LOW (ref 6.5–8.1)

## 2015-01-28 LAB — CBC WITH DIFFERENTIAL/PLATELET
Basophils Absolute: 0 10*3/uL (ref 0.0–0.1)
Basophils Relative: 0 %
EOS ABS: 0.1 10*3/uL (ref 0.0–0.7)
EOS PCT: 2 %
HCT: 32.2 % — ABNORMAL LOW (ref 36.0–46.0)
Hemoglobin: 10.7 g/dL — ABNORMAL LOW (ref 12.0–15.0)
LYMPHS ABS: 1.1 10*3/uL (ref 0.7–4.0)
LYMPHS PCT: 14 %
MCH: 36.3 pg — AB (ref 26.0–34.0)
MCHC: 33.2 g/dL (ref 30.0–36.0)
MCV: 109.2 fL — AB (ref 78.0–100.0)
MONO ABS: 0.8 10*3/uL (ref 0.1–1.0)
MONOS PCT: 11 %
Neutro Abs: 5.7 10*3/uL (ref 1.7–7.7)
Neutrophils Relative %: 74 %
PLATELETS: 108 10*3/uL — AB (ref 150–400)
RBC: 2.95 MIL/uL — ABNORMAL LOW (ref 3.87–5.11)
RDW: 16 % — AB (ref 11.5–15.5)
WBC: 7.7 10*3/uL (ref 4.0–10.5)

## 2015-01-28 LAB — LIPASE, BLOOD: Lipase: 41 U/L (ref 22–51)

## 2015-01-28 LAB — C DIFFICILE QUICK SCREEN W PCR REFLEX
C DIFFICLE (CDIFF) ANTIGEN: NEGATIVE
C Diff interpretation: NEGATIVE
C Diff toxin: NEGATIVE

## 2015-01-28 MED ORDER — ACETAMINOPHEN 325 MG PO TABS
650.0000 mg | ORAL_TABLET | Freq: Four times a day (QID) | ORAL | Status: DC | PRN
  Administered 2015-01-28: 650 mg via ORAL
  Filled 2015-01-28: qty 2

## 2015-01-28 MED ORDER — AMOXICILLIN 500 MG PO CAPS
500.0000 mg | ORAL_CAPSULE | Freq: Three times a day (TID) | ORAL | Status: DC
  Administered 2015-01-28 – 2015-02-01 (×12): 500 mg via ORAL
  Filled 2015-01-28 (×16): qty 1

## 2015-01-28 MED ORDER — SUCRALFATE 1 GM/10ML PO SUSP
1.0000 g | Freq: Two times a day (BID) | ORAL | Status: DC
  Administered 2015-01-28: 1 g via ORAL
  Filled 2015-01-28 (×3): qty 10

## 2015-01-28 NOTE — Progress Notes (Signed)
Patient ID: Nicole Roberts, female   DOB: 10/26/1921, 81 y.o.   MRN: [id number]         [facility] for Infectious Disease    Date of Admission:  01/22/2015   Total days of antibiotics 6        Day 2 amoxicillin          Principal Problem:   Bacteremia due to Escherichia coli Active Problems:   Sepsis due to Escherichia coli (E. coli)   Acute pancreatitis   Duodenitis   Hypothyroidism   Dyslipidemia   Essential hypertension   GERD   Rheumatoid arthritis   Atrial fibrillation   Stage III chronic kidney disease   Depression   Abdominal pain, epigastric   Hypokalemia   Leukocytosis   Abnormal LFTs (liver function tests)   Neuropathy   Chronic combined systolic and diastolic CHF (congestive heart failure)   UTI (urinary tract infection)   Thrombocytopenia   Protein-calorie malnutrition, moderate   Wheelchair bound   Acute encephalopathy   Recurrent cold sores   Edema leg   Diarrhea   Abnormal LFTs   . acyclovir  400 mg Oral 5 X Daily  . amoxicillin  500 mg Oral 3 times per day  . antiseptic oral rinse  7 mL Mouth Rinse q12n4p  . citalopram  20 mg Oral Daily  . conjugated estrogens  1 Applicatorful Vaginal Once per day on Mon Wed Fri  . feeding supplement (ENSURE ENLIVE)  237 mL Oral BID BM  . fentaNYL  75 mcg Transdermal Q72H  . folic acid  378 mcg Oral q morning - 10a  . furosemide  20 mg Oral Daily  . gabapentin  800 mg Oral QHS  . isosorbide mononitrate  15 mg Oral Daily  . levothyroxine  50 mcg Oral Daily  . magic mouthwash w/lidocaine  10 mL Oral QID  . metoprolol succinate  50 mg Oral BID  . pantoprazole  40 mg Oral Daily  . polyvinyl alcohol  1 drop Both Eyes TID  . potassium chloride  20 mEq Oral TID  . predniSONE  5 mg Oral Q breakfast    SUBJECTIVE: Overall, she is feeling better. Her abdominal pain is resolved but she is having more diarrhea. She also states that she feels bloated and she is having problems with her heartburn. She is  requesting to have her Carafate restarted. She also has some mild headache which is unusual for her. She has not had any more fever. She's not having any chills or sweats or dysuria.  Review of Systems: Pertinent items are noted in HPI.  Past Medical History  Diagnosis Date  . HEARING LOSS   . MITRAL VALVE INSUFF&AORTIC VALVE INSUFF     moderate MR  . PULMONARY HYPERTENSION   . Atrial fibrillation     permanent  . SICK SINUS SYNDROME     a. Tachybrady syndrome - Guidant PPM 10/2005.  . Edema 03/19/2009    due to venous insufficiency, chronic lymphedema  . URINARY INCONTINENCE   . Rheumatoid arthritis(714.0) dx clarified 2011    On prednisone  . BREAST CANCER 12/2006    ductal ca s/p L lumpectomy  . Diastolic dysfunction   . HYPERTENSION   . HYPERLIPIDEMIA   . GERD   . HYPOTHYROIDISM   . Hyperparathyroidism     2 lobes removed  . Venous insufficiency     chronic BLE edema  . Spinal stenosis   . Anemia     a.  Macrocytic - normal B12, folate 08/2011. b. 3/6 heme positive stools 10/2011 - per PCP note, elected against colonscopy.  [name] TIA (transient ischemic attack) 05/2011  . Anxiety     Social History  Substance Use Topics  . Smoking status: Never Smoker   . Smokeless tobacco: Never Used  . Alcohol Use: No    Family History  Problem Relation Age of Onset  . Ovarian cancer Mother   . Coronary artery disease Sister   . Coronary artery disease Brother   . Hypertension Mother     grandparent  . Lung cancer Father    Allergies  Allergen Reactions  . Sulfa Antibiotics Itching  . Tramadol Hcl Itching and Rash    OBJECTIVE: Filed Vitals:   01/27/15 1317 01/27/15 2119 01/28/15 0605 01/28/15 1535  BP: 141/62 149/71 170/88 137/61  Pulse: 79 86 82 80  Temp: 98.1 F (36.7 C) 98.1 F (36.7 C) 98.5 F (36.9 C) 98.2 F (36.8 C)  TempSrc: Oral Oral Oral Oral  Resp: 18 18 16 18   Height:      Weight:      SpO2: 100% 100% 100% 100%   Body mass index is 30.92  kg/(m^2).  General: She is pleasant and talkative Skin: No rash Lungs: Clear Cor: Regular S1 and S2 with no murmurs Abdomen: Mild diffuse tenderness with active bowel sounds  Lab Results Lab Results  Component Value Date   WBC 7.7 01/28/2015   HGB 10.7* 01/28/2015   HCT 32.2* 01/28/2015   MCV 109.2* 01/28/2015   PLT 108* 01/28/2015    Lab Results  Component Value Date   CREATININE 0.87 01/28/2015   BUN 20 01/28/2015   NA 141 01/28/2015   K 4.1 01/28/2015   CL 111 01/28/2015   CO2 23 01/28/2015    Lab Results  Component Value Date   ALT 129* 01/28/2015   AST 25 01/28/2015   ALKPHOS 131* 01/28/2015   BILITOT 1.0 01/28/2015     Microbiology: Recent Results (from the past 240 hour(s))  Culture, blood (routine x 2)     Status: None   Collection Time: 01/23/15 10:21 AM  Result Value Ref Range Status   Specimen Description BLOOD RIGHT HAND  Final   Special Requests BOTTLES DRAWN AEROBIC AND ANAEROBIC 10CC  Final   Culture  Setup Time   Final    GRAM NEGATIVE RODS IN BOTH AEROBIC AND ANAEROBIC BOTTLES CRITICAL RESULT CALLED TO, READ BACK BY AND VERIFIED WITH: Eston Esters RN [id number] 01/23/15 A BROWNING    Culture   Final    ESCHERICHIA COLI Performed at [facility]    Report Status 01/25/2015 FINAL  Final   Organism ID, Bacteria ESCHERICHIA COLI  Final      Susceptibility   Escherichia coli - MIC*    AMPICILLIN 8 SENSITIVE Sensitive     CEFAZOLIN <=4 SENSITIVE Sensitive     CEFEPIME <=1 SENSITIVE Sensitive     CEFTAZIDIME <=1 SENSITIVE Sensitive     CEFTRIAXONE <=1 SENSITIVE Sensitive     CIPROFLOXACIN <=0.25 SENSITIVE Sensitive     GENTAMICIN <=1 SENSITIVE Sensitive     IMIPENEM <=0.25 SENSITIVE Sensitive     TRIMETH/SULFA <=20 SENSITIVE Sensitive     AMPICILLIN/SULBACTAM 4 SENSITIVE Sensitive     PIP/TAZO <=4 SENSITIVE Sensitive     * ESCHERICHIA COLI  Culture, blood (routine x 2)     Status: None   Collection Time: 01/23/15 10:28 AM  Result Value Ref  Range Status  Specimen Description BLOOD LEFT HAND  Final   Special Requests BOTTLES DRAWN AEROBIC AND ANAEROBIC 10CC  Final   Culture  Setup Time   Final    GRAM NEGATIVE RODS IN BOTH AEROBIC AND ANAEROBIC BOTTLES CRITICAL RESULT CALLED TO, READ BACK BY AND VERIFIED WITH: Richard Miu DYKE RN [phone] 01/23/15 A BROWNING    Culture   Final    ESCHERICHIA COLI SUSCEPTIBILITIES PERFORMED ON PREVIOUS CULTURE WITHIN THE LAST 5 DAYS. Performed at [facility]    Report Status 01/25/2015 FINAL  Final  Culture, Urine     Status: None   Collection Time: 01/23/15  4:06 PM  Result Value Ref Range Status   Specimen Description URINE, CLEAN CATCH  Final   Special Requests NONE  Final   Culture   Final    >=100,000 COLONIES/mL ESCHERICHIA COLI Performed at [facility]    Report Status 01/26/2015 FINAL  Final   Organism ID, Bacteria ESCHERICHIA COLI  Final      Susceptibility   Escherichia coli - MIC*    AMPICILLIN 8 SENSITIVE Sensitive     CEFAZOLIN <=4 SENSITIVE Sensitive     CEFTRIAXONE <=1 SENSITIVE Sensitive     CIPROFLOXACIN <=0.25 SENSITIVE Sensitive     GENTAMICIN <=1 SENSITIVE Sensitive     IMIPENEM <=0.25 SENSITIVE Sensitive     NITROFURANTOIN <=16 SENSITIVE Sensitive     TRIMETH/SULFA <=20 SENSITIVE Sensitive     AMPICILLIN/SULBACTAM 4 SENSITIVE Sensitive     PIP/TAZO <=4 SENSITIVE Sensitive     * >=100,000 COLONIES/mL ESCHERICHIA COLI  Culture, blood (x 2)     Status: None (Preliminary result)   Collection Time: 01/25/15 12:37 AM  Result Value Ref Range Status   Specimen Description BLOOD RIGHT ARM  Final   Special Requests BOTTLES DRAWN AEROBIC AND ANAEROBIC 5.5CC  Final   Culture   Final    NO GROWTH 3 DAYS Performed at [facility]    Report Status PENDING  Incomplete  Culture, blood (x 2)     Status: None (Preliminary result)   Collection Time: 01/25/15 12:45 AM  Result Value Ref Range Status   Specimen Description BLOOD RIGHT ARM  Final   Special  Requests BOTTLES DRAWN AEROBIC ONLY 10CC  Final   Culture   Final    NO GROWTH 3 DAYS Performed at [facility]    Report Status PENDING  Incomplete     ASSESSMENT: Her epigastric pain has resolved and her liver enzymes are returning toward normal. Her lipase has normalized. She is scheduled for upper endoscopy tomorrow morning. I will continue amoxicillin for her Escherichia coli bacteremia that is probably related to her duodenitis.  She is having more diarrhea and screening tests for C. difficile is pending.  PLAN: 1. Continue amoxicillin 2. C. difficile screen  Michel Bickers, MD [facility] for [place] [phone] pager   [phone] cell 01/28/2015, 5:12 PM

## 2015-01-28 NOTE — Progress Notes (Signed)
[facility] PROGRESS NOTE Interim History: 81 year old with past medical history of essential hypertension peptic ulcer disease atrial fibrillation on apixiban and dementia who comes in complaining of epigastric pain and nausea but no vomiting. On admission she was found to have acute pancreatitis with duodenitis on CT scan. Her lipase was 1692 with an elevation of her liver enzymes including a bilirubin of 2.8 abdominal ultrasound showed no acute findings, she was also found to be bacteremic with 2 x 2 blood culture showing Escherichia coli she had been started on empiric antibiotics. ID was consulted and recommended to consider which are to amoxicillin   Assessment/Plan: Acute pancreatitis and duodenitis: She initially was treated conservatively with minimal improvement, her LFTs are slowly trending down her bilirubinemia did resolve., GI was consulted who recommended an MRCP which cannot be done due to her pacemaker not being compatible. Her Eluquis was stopped, in anticipation of an ERCP or EGD. Patient is currently on a soft diet appreciate GIs assistance. She continues to be on acyclovir  Escherichia coli bacteremia: Probably translocation from her acute duodenitis, she was started empirically on Zosyn now transition to oral amoxicillin which she will continue for 8 additional days.  Right lower extremity edema: Doppler was negative for DVT.  Hypothyroidism: Continue Synthroid.  Dyslipidemia: Statins held due to elevated LFTs.  Essential hypertension: Continue current regimen.  GERD: Continue Protonix.  Rheumatoid arthritis: Continue prednisone.  Stage III chronic kidney disease:  Currently at baseline.  Depression: - Continue Celexa.  Chronic combined systolic and diastolic heart failure: Seems to be compensated.  Thrombocytopenia: Slowly trending up likely due infectious state.  Chronic atrial fibrillation: Continue Eluquis and metoprolol.  Severe  protein caloric malnutrition: Continue current supplements.  Acute encephalopathy: Likely due to infectious etiology.  Family communication: Son by telephone. Disposition: We'll probably go to assisted living facility.  Code status   Consultants:  GI  ID  Procedures:  CT abd and pelvis  Antibiotics:  amoxicillin 9.28.2015  HPI/Subjective: She relates she has tolerated her diet without any nausea or abdominal pain  Objective: Filed Vitals:   01/27/15 0900 01/27/15 1317 01/27/15 2119 01/28/15 0605  BP: 155/69 141/62 149/71 170/88  Pulse: 79 79 86 82  Temp: 98.2 F (36.8 C) 98.1 F (36.7 C) 98.1 F (36.7 C) 98.5 F (36.9 C)  TempSrc: Oral Oral Oral Oral  Resp:  18 18 16   Height:      Weight:      SpO2: 99% 100% 100% 100%    Intake/Output Summary (Last 24 hours) at 01/28/15 0758 Last data filed at 01/27/15 1800  Gross per 24 hour  Intake    975 ml  Output    300 ml  Net    675 ml   Filed Weights   01/23/15 0400 01/23/15 1840 01/25/15 0659  Weight: 67.495 kg (148 lb 12.8 oz) 73.7 kg (162 lb 7.7 oz) 74.2 kg (163 lb 9.3 oz)    Exam:  General: Alert, awake, oriented x3, in no acute distress.  HEENT: No bruits, no goiter.  Heart: Regular rate and rhythm. Lungs: Good air movement, clear Abdomen: Soft, no epigastric tenderness, nondistended, positive bowel sounds.  Neuro: Grossly intact, nonfocal.   Data Reviewed: Basic Metabolic Panel:  Recent Labs Lab 01/23/15 0042 01/26/15 0522 01/27/15 0438 01/27/15 0835 01/28/15 0515  NA 141 141 139  --  141  K 3.4* 3.3* 3.4*  --  4.1  CL 106 109 112*  --  111  CO2 26 24  23  --  23  GLUCOSE 78 90 82  --  85  BUN 25* 22* 19  --  20  CREATININE 1.15* 1.02* 0.89  --  0.87  CALCIUM 8.9 7.8* 7.7*  --  8.5*  MG  --   --   --  1.3*  --    Liver Function Tests:  Recent Labs Lab 01/23/15 0042 01/26/15 0522 01/27/15 0438 01/28/15 0515  AST 813* 49* 30 25  ALT 461* 200* 147* 129*  ALKPHOS 214* 149* 129*  131*  BILITOT 2.8* 1.6* 1.0 1.0  PROT 6.7 5.5* 5.4* 5.9*  ALBUMIN 3.6 2.8* 2.7* 2.8*    Recent Labs Lab 01/23/15 0042 01/23/15 0900 01/26/15 0522 01/28/15 0515  LIPASE 1692* 720* 44 41   No results for input(s): AMMONIA in the last 168 hours. CBC:  Recent Labs Lab 01/23/15 0042 01/26/15 0522 01/27/15 0835 01/28/15 0515  WBC 14.4* 8.8 8.0 7.7  NEUTROABS 13.5*  --   --  5.7  HGB 12.3 9.6* 10.4* 10.7*  HCT 37.8 28.0* 31.2* 32.2*  MCV 112.5* 110.2* 111.0* 109.2*  PLT 144* 88* 102* 108*   Cardiac Enzymes: No results for input(s): CKTOTAL, CKMB, CKMBINDEX, TROPONINI in the last 168 hours. BNP (last 3 results) No results for input(s): BNP in the last 8760 hours.  ProBNP (last 3 results) No results for input(s): PROBNP in the last 8760 hours.  CBG: No results for input(s): GLUCAP in the last 168 hours.  Recent Results (from the past 240 hour(s))  Culture, blood (routine x 2)     Status: None   Collection Time: 01/23/15 10:21 AM  Result Value Ref Range Status   Specimen Description BLOOD RIGHT HAND  Final   Special Requests BOTTLES DRAWN AEROBIC AND ANAEROBIC 10CC  Final   Culture  Setup Time   Final    GRAM NEGATIVE RODS IN BOTH AEROBIC AND ANAEROBIC BOTTLES CRITICAL RESULT CALLED TO, READ BACK BY AND VERIFIED WITH: Eston Esters RN [id number] 01/23/15 A BROWNING    Culture   Final    ESCHERICHIA COLI Performed at [facility]    Report Status 01/25/2015 FINAL  Final   Organism ID, Bacteria ESCHERICHIA COLI  Final      Susceptibility   Escherichia coli - MIC*    AMPICILLIN 8 SENSITIVE Sensitive     CEFAZOLIN <=4 SENSITIVE Sensitive     CEFEPIME <=1 SENSITIVE Sensitive     CEFTAZIDIME <=1 SENSITIVE Sensitive     CEFTRIAXONE <=1 SENSITIVE Sensitive     CIPROFLOXACIN <=0.25 SENSITIVE Sensitive     GENTAMICIN <=1 SENSITIVE Sensitive     IMIPENEM <=0.25 SENSITIVE Sensitive     TRIMETH/SULFA <=20 SENSITIVE Sensitive     AMPICILLIN/SULBACTAM 4 SENSITIVE Sensitive       PIP/TAZO <=4 SENSITIVE Sensitive     * ESCHERICHIA COLI  Culture, blood (routine x 2)     Status: None   Collection Time: 01/23/15 10:28 AM  Result Value Ref Range Status   Specimen Description BLOOD LEFT HAND  Final   Special Requests BOTTLES DRAWN AEROBIC AND ANAEROBIC 10CC  Final   Culture  Setup Time   Final    GRAM NEGATIVE RODS IN BOTH AEROBIC AND ANAEROBIC BOTTLES CRITICAL RESULT CALLED TO, READ BACK BY AND VERIFIED WITH: Richard Miu DYKE RN [id number] 01/23/15 A BROWNING    Culture   Final    ESCHERICHIA COLI SUSCEPTIBILITIES PERFORMED ON PREVIOUS CULTURE WITHIN THE LAST 5 DAYS. Performed at [facility]    Report Status 01/25/2015 FINAL  Final  Culture, Urine     Status: None   Collection Time: 01/23/15  4:06 PM  Result Value Ref Range Status   Specimen Description URINE, CLEAN CATCH  Final   Special Requests NONE  Final   Culture   Final    >=100,000 COLONIES/mL ESCHERICHIA COLI Performed at [facility]    Report Status 01/26/2015 FINAL  Final   Organism ID, Bacteria ESCHERICHIA COLI  Final      Susceptibility   Escherichia coli - MIC*    AMPICILLIN 8 SENSITIVE Sensitive     CEFAZOLIN <=4 SENSITIVE Sensitive     CEFTRIAXONE <=1 SENSITIVE Sensitive     CIPROFLOXACIN <=0.25 SENSITIVE Sensitive     GENTAMICIN <=1 SENSITIVE Sensitive     IMIPENEM <=0.25 SENSITIVE Sensitive     NITROFURANTOIN <=16 SENSITIVE Sensitive     TRIMETH/SULFA <=20 SENSITIVE Sensitive     AMPICILLIN/SULBACTAM 4 SENSITIVE Sensitive     PIP/TAZO <=4 SENSITIVE Sensitive     * >=100,000 COLONIES/mL ESCHERICHIA COLI  Culture, blood (x 2)     Status: None (Preliminary result)   Collection Time: 01/25/15 12:37 AM  Result Value Ref Range Status   Specimen Description BLOOD RIGHT ARM  Final   Special Requests BOTTLES DRAWN AEROBIC AND ANAEROBIC 5.5CC  Final   Culture   Final    NO GROWTH 2 DAYS Performed at [facility]    Report Status PENDING  Incomplete  Culture, blood (x 2)      Status: None (Preliminary result)   Collection Time: 01/25/15 12:45 AM  Result Value Ref Range Status   Specimen Description BLOOD RIGHT ARM  Final   Special Requests BOTTLES DRAWN AEROBIC ONLY 10CC  Final   Culture   Final    NO GROWTH 2 DAYS Performed at [facility]    Report Status PENDING  Incomplete     Studies: Mr Attempted Study-no Report  01/27/2015   This examination belongs to an outside facility and is stored  here for comparison purposes only.  Contact the originating outside  institution for any associated report or interpretation.   Scheduled Meds: . acyclovir  400 mg Oral 5 X Daily  . antiseptic oral rinse  7 mL Mouth Rinse q12n4p  . ciprofloxacin  500 mg Oral BID  . citalopram  20 mg Oral Daily  . conjugated estrogens  1 Applicatorful Vaginal Once per day on Mon Wed Fri  . feeding supplement (ENSURE ENLIVE)  237 mL Oral BID BM  . fentaNYL  75 mcg Transdermal Q72H  . folic acid  751 mcg Oral q morning - 10a  . furosemide  20 mg Oral Daily  . gabapentin  800 mg Oral QHS  . isosorbide mononitrate  15 mg Oral Daily  . levothyroxine  50 mcg Oral Daily  . magic mouthwash w/lidocaine  10 mL Oral QID  . metoprolol succinate  50 mg Oral BID  . metroNIDAZOLE  500 mg Oral 3 times per day  . pantoprazole  40 mg Oral Daily  . polyvinyl alcohol  1 drop Both Eyes TID  . potassium chloride  20 mEq Oral TID  . predniSONE  5 mg Oral Q breakfast   Continuous Infusions: . sodium chloride 75 mL/hr at 01/26/15 1941    Time Spent: 15 min   Charlynne Cousins  [facility] Pager [phone]. If 7PM-7AM, please contact night-coverage at [email or web address], password [facility] 01/28/2015, 7:58 AM  LOS: 5 days

## 2015-01-28 NOTE — Progress Notes (Signed)
PHYSICAL THERAPY NOTE  Patient Details Name: Nicole Roberts MRN: [id number] DOB: 04-05-1922   Paged by RN this morning that family would like pt to be seen more that 2x/wk as indicated on PT eval.    I personal treated pt yesterday.  She requires + 2 total Assist to transfer and has been non ambulatory for quite some time.  She resides at an ALF with her spouse and is wheelchair bound.  Very pleasant lady.  Advised RN pt is a [employer] fast streamer to get OOB and that the nursing staff can assist.  PT will continue to Tx as indicated on eval.    Rica Koyanagi  PTA [facility]  Acute  Rehab Pager      [phone]

## 2015-01-28 NOTE — Progress Notes (Signed)
CSW assisting with d/c planning. [facility] contacted and clinicals sent for review. Pt was w/c bound at facility. Staff assisted with ADL's and transfers. PT is working with pt on transfers in hospital.  ALF is requesting HHPT at facility to continue assisting with transfers. ALF is open to trying sliding board transfers with pt if recommended by PT.  CSW will continue to assist with d/c planning back to ALF at d/c.  Werner Lean LCSW [phone]

## 2015-01-28 NOTE — Progress Notes (Signed)
Speech Language Pathology Treatment: Dysphagia  Patient Details Name: Nicole Roberts MRN: [id number] DOB: 1921/07/26 Today's Date: 01/28/2015 Time: 8182-9937 SLP Time Calculation (min) (ACUTE ONLY): 11 min  Assessment / Plan / Recommendation Clinical Impression  No complaint of swallowing problems over the past several days. No indications of pharyngeal dysphagia during observation of regular texture/thin liquids. Aspiration risk low, does not appear to have respiratory issues at present. Pt could upgrade to regular if desired (even without partial, oral prep and mastication functional). No further ST required.   HPI Other Pertinent Information: 81 y.o. female PMH: hearing loss, mitral valve insuffieciency, A-fib, breast cancer, HTN, spinal stenosis, TIA who presents to the ED with abdominal pain.Found to have acute pancreatitis. CXR cardiomegaly with early interstitial edema pattern. Cervical spine CT 2014 advanced multilevel degenerative disc disease and cervical spondylosis. EGD 09/2012 revealed white exudates consistent with candidiasis, tortuous esophagus, mildly dilated proximal esophagus, mild prepyloric gastritis.If symptoms don't resolve with Diflucan consider evaluation for possible esophageal motility disorder and gastroparesis. No prior ST notes.   Pertinent Vitals Pain Assessment: No/denies pain  SLP Plan  Discharge SLP treatment due to (comment)    Recommendations Diet recommendations: Regular;Thin liquid Liquids provided via: Cup;Straw Medication Administration: Whole meds with liquid Supervision: Patient able to self feed Compensations: Slow rate;Small sips/bites Postural Changes and/or Swallow Maneuvers: Seated upright 90 degrees              Oral Care Recommendations: Oral care BID Follow up Recommendations: None Plan: Discharge SLP treatment due to (comment)    GO     Houston Siren 01/28/2015, 8:31 AM  Orbie Pyo Colvin Caroli.Ed [employer]  [phone]

## 2015-01-28 NOTE — Progress Notes (Signed)
     [facility] Progress Note  Subjective:  T bili today 1.o. Alk phos 131, ALT 129, AST 25. WBC 7.7.  Eliquis stopped yesterday. Pt was unable to have MRCP due to pacemaker. Denies abd pain, but is fearful of "having another episode of that terrible pain".Had e coli bacteremia and was on zosyn, but currently on amoxil.Heme positive stool on exam yesterday.   Objective:  Vital signs in last 24 hours: Temp:  [98.1 F (36.7 C)-98.5 F (36.9 C)] 98.5 F (36.9 C) (09/28 0605) Pulse Rate:  [79-86] 82 (09/28 0605) Resp:  [16-18] 16 (09/28 0605) BP: (141-170)/(62-88) 170/88 mmHg (09/28 0605) SpO2:  [99 %-100 %] 100 % (09/28 0605) Last BM Date: 01/26/15 General:   Alert,  Well-developed, in NAD Heart:  2/6 SEM Pulm;lungs clear Abdomen:  Soft, mild epigastric tenderness to palpation,  Normal bowel sounds, without guarding, and without rebound.   Extremities:  1-2+ edema LE. Neurologic: Alert and  oriented x4;  grossly normal neurologically. Psych: Alert and cooperative. Normal mood and affect.  Intake/Output from previous day: 09/27 0701 - 09/28 0700 In: 975 [I.V.:975] Out: 300 [Urine:300] Intake/Output this shift:    Lab Results:  Recent Labs  01/26/15 0522 01/27/15 0835 01/28/15 0515  WBC 8.8 8.0 7.7  HGB 9.6* 10.4* 10.7*  HCT 28.0* 31.2* 32.2*  PLT 88* 102* 108*   BMET  Recent Labs  01/26/15 0522 01/27/15 0438 01/28/15 0515  NA 141 139 141  K 3.3* 3.4* 4.1  CL 109 112* 111  CO2 $Re'24 23 23  'IYe$ GLUCOSE 90 82 85  BUN 22* 19 20  CREATININE 1.02* 0.89 0.87  CALCIUM 7.8* 7.7* 8.5*   LFT  Recent Labs  01/28/15 0515  PROT 5.9*  ALBUMIN 2.8*  AST 25  ALT 129*  ALKPHOS 131*  BILITOT 1.0    ASSESSMENT/PLAN:   Acute pancreatitis, suspected biliary etiology. R/O CBD stone(s). MRCP not able to be performed due to pacemaker. LFts improving. Eliquis  Stopped yesterday. Heme positive stools. Will discuss with Dr Deatra Ina as to plans for EGD or  ERCP.     LOS: 5 days   Orit Sanville, Vita Barley PA-C 01/28/2015, Pager [phone]

## 2015-01-29 ENCOUNTER — Inpatient Hospital Stay (HOSPITAL_COMMUNITY): Payer: Medicare Other | Admitting: Anesthesiology

## 2015-01-29 ENCOUNTER — Inpatient Hospital Stay (HOSPITAL_COMMUNITY): Payer: Medicare Other

## 2015-01-29 ENCOUNTER — Encounter (HOSPITAL_COMMUNITY): Payer: Self-pay

## 2015-01-29 ENCOUNTER — Encounter (HOSPITAL_COMMUNITY)
Admission: EM | Disposition: A | Discharge status: Skilled Nursing Facility | Payer: Self-pay | Source: Home / Self Care | Attending: Internal Medicine

## 2015-01-29 DIAGNOSIS — K8050 Calculus of bile duct without cholangitis or cholecystitis without obstruction: Secondary | ICD-10-CM

## 2015-01-29 DIAGNOSIS — A419 Sepsis, unspecified organism: Secondary | ICD-10-CM

## 2015-01-29 HISTORY — PX: ESOPHAGOGASTRODUODENOSCOPY (EGD) WITH PROPOFOL: SHX5813

## 2015-01-29 HISTORY — PX: ERCP: SHX5425

## 2015-01-29 LAB — COMPREHENSIVE METABOLIC PANEL
ALBUMIN: 2.9 g/dL — AB (ref 3.5–5.0)
ALT: 103 U/L — AB (ref 14–54)
ANION GAP: 8 (ref 5–15)
AST: 23 U/L (ref 15–41)
Alkaline Phosphatase: 120 U/L (ref 38–126)
BUN: 17 mg/dL (ref 6–20)
CHLORIDE: 109 mmol/L (ref 101–111)
CO2: 22 mmol/L (ref 22–32)
Calcium: 8.6 mg/dL — ABNORMAL LOW (ref 8.9–10.3)
Creatinine, Ser: 0.82 mg/dL (ref 0.44–1.00)
GFR calc non Af Amer: >60 mL/min (ref >60)
GLUCOSE: 86 mg/dL (ref 65–99)
Potassium: 4.5 mmol/L (ref 3.5–5.1)
SODIUM: 139 mmol/L (ref 135–145)
Total Bilirubin: 1.1 mg/dL (ref 0.3–1.2)
Total Protein: 5.8 g/dL — ABNORMAL LOW (ref 6.5–8.1)

## 2015-01-29 SURGERY — ESOPHAGOGASTRODUODENOSCOPY (EGD) WITH PROPOFOL
Anesthesia: Monitor Anesthesia Care

## 2015-01-29 MED ORDER — FENTANYL CITRATE (PF) 100 MCG/2ML IJ SOLN
INTRAMUSCULAR | Status: AC
  Filled 2015-01-29: qty 4

## 2015-01-29 MED ORDER — ONDANSETRON HCL 4 MG/2ML IJ SOLN
INTRAMUSCULAR | Status: DC | PRN
  Administered 2015-01-29: 4 mg via INTRAVENOUS

## 2015-01-29 MED ORDER — PROPOFOL 10 MG/ML IV BOLUS
INTRAVENOUS | Status: AC
  Filled 2015-01-29: qty 20

## 2015-01-29 MED ORDER — ETOMIDATE 2 MG/ML IV SOLN
INTRAVENOUS | Status: DC | PRN
  Administered 2015-01-29: 10 mg via INTRAVENOUS

## 2015-01-29 MED ORDER — ONDANSETRON HCL 4 MG/2ML IJ SOLN
4.0000 mg | Freq: Once | INTRAMUSCULAR | Status: AC | PRN
  Administered 2015-01-29: 4 mg via INTRAVENOUS

## 2015-01-29 MED ORDER — ONDANSETRON HCL 4 MG/2ML IJ SOLN
INTRAMUSCULAR | Status: AC
  Filled 2015-01-29: qty 2

## 2015-01-29 MED ORDER — LABETALOL HCL 5 MG/ML IV SOLN
INTRAVENOUS | Status: AC
  Filled 2015-01-29: qty 4

## 2015-01-29 MED ORDER — NEOSTIGMINE METHYLSULFATE 10 MG/10ML IV SOLN
INTRAVENOUS | Status: DC | PRN
Start: 2015-01-29 — End: 2015-01-29
  Administered 2015-01-29: 4 mg via INTRAVENOUS

## 2015-01-29 MED ORDER — SUCCINYLCHOLINE CHLORIDE 20 MG/ML IJ SOLN
INTRAMUSCULAR | Status: DC | PRN
  Administered 2015-01-29: 100 mg via INTRAVENOUS

## 2015-01-29 MED ORDER — GLYCOPYRROLATE 0.2 MG/ML IJ SOLN
INTRAMUSCULAR | Status: DC | PRN
  Administered 2015-01-29: .6 mg via INTRAVENOUS

## 2015-01-29 MED ORDER — SUCRALFATE 1 GM/10ML PO SUSP
1.0000 g | Freq: Three times a day (TID) | ORAL | Status: DC
  Administered 2015-01-29 – 2015-02-01 (×11): 1 g via ORAL
  Filled 2015-01-29 (×16): qty 10

## 2015-01-29 MED ORDER — FENTANYL CITRATE (PF) 100 MCG/2ML IJ SOLN
INTRAMUSCULAR | Status: DC | PRN
  Administered 2015-01-29 (×4): 25 ug via INTRAVENOUS

## 2015-01-29 MED ORDER — FENTANYL CITRATE (PF) 100 MCG/2ML IJ SOLN: 25.0000 ug | INTRAMUSCULAR | Status: DC | PRN

## 2015-01-29 MED ORDER — IOHEXOL 350 MG/ML SOLN
INTRAVENOUS | Status: DC | PRN
  Administered 2015-01-29: 28 mL

## 2015-01-29 MED ORDER — PANTOPRAZOLE SODIUM 40 MG PO TBEC
40.0000 mg | DELAYED_RELEASE_TABLET | Freq: Two times a day (BID) | ORAL | Status: DC
  Administered 2015-01-29 – 2015-01-31 (×6): 40 mg via ORAL
  Filled 2015-01-29 (×8): qty 1

## 2015-01-29 MED ORDER — PROPOFOL 500 MG/50ML IV EMUL
INTRAVENOUS | Status: DC | PRN
  Administered 2015-01-29: 50 ug/kg/min via INTRAVENOUS

## 2015-01-29 MED ORDER — SACCHAROMYCES BOULARDII 250 MG PO CAPS
250.0000 mg | ORAL_CAPSULE | Freq: Two times a day (BID) | ORAL | Status: DC
  Administered 2015-01-29 – 2015-01-31 (×6): 250 mg via ORAL
  Filled 2015-01-29 (×8): qty 1

## 2015-01-29 MED ORDER — SODIUM CHLORIDE 0.9 % IV SOLN: 3.0000 g | INTRAVENOUS | Status: DC

## 2015-01-29 MED ORDER — SODIUM CHLORIDE 0.9 % IV SOLN
INTRAVENOUS | Status: DC
  Administered 2015-01-29: 13:00:00 via INTRAVENOUS

## 2015-01-29 MED ORDER — LABETALOL HCL 5 MG/ML IV SOLN
INTRAVENOUS | Status: DC | PRN
  Administered 2015-01-29: 2.5 mg via INTRAVENOUS
  Administered 2015-01-29 (×3): 5 mg via INTRAVENOUS

## 2015-01-29 MED ORDER — ROCURONIUM BROMIDE 100 MG/10ML IV SOLN
INTRAVENOUS | Status: DC | PRN
  Administered 2015-01-29: 20 mg via INTRAVENOUS

## 2015-01-29 MED ORDER — HYDRALAZINE HCL 20 MG/ML IJ SOLN
5.0000 mg | Freq: Four times a day (QID) | INTRAMUSCULAR | Status: DC | PRN
  Administered 2015-01-29: 5 mg via INTRAVENOUS
  Filled 2015-01-29: qty 1

## 2015-01-29 MED ORDER — INDOMETHACIN 50 MG RE SUPP
100.0000 mg | Freq: Once | RECTAL | Status: AC
  Administered 2015-01-29: 100 mg via RECTAL
  Filled 2015-01-29 (×2): qty 2

## 2015-01-29 SURGICAL SUPPLY — 14 items

## 2015-01-29 NOTE — Progress Notes (Signed)
Dear Doctor: Venetia Constable ortizThis patient has been identified as a candidate for PICC for the following reason (s): IV therapy over 48 hours and poor veins/poor circulatory system (CHF, COPD, emphysema, diabetes, steroid use, IV drug abuse, etc.) If you agree, please write an order for the indicated device. For any questions contact the Vascular Access Team at [phone] if no answer, please leave a message.  Thank you for supporting the early vascular access assessment program.

## 2015-01-29 NOTE — Progress Notes (Signed)
Multiple bile duct stones were extracted from the bile duct following sphincterotomy.  Advance diet as tolerated.  Continue antibiotics.

## 2015-01-29 NOTE — Progress Notes (Signed)
PT Cancellation Note  Patient Details Name: Rocklyn Mayberry MRN: [id number] DOB: 03-05-1922   Cancelled Treatment:    Reason Eval/Treat Not Completed: Patient at procedure or test/unavailable (pt to go to endoscopy in a few minutes per RN, will follow. )   Philomena Doheny 01/29/2015, 11:11 AM [phone]

## 2015-01-29 NOTE — Op Note (Signed)
[facility] [place], [address]   ENDOSCOPY PROCEDURE REPORT  PATIENT: Nicole Roberts, Nicole Roberts  MR#: [id number] BIRTHDATE: March 18, 1922 , 89  yrs. old GENDER: female ENDOSCOPIST: Inda Castle, MD REFERRED BY:  Gwendolyn Grant, M.D. PROCEDURE DATE:  01/29/2015 PROCEDURE: ASA CLASS:     Class III INDICATIONS:  pancreatitis of unknown etiology; Hemoccults positive stool. MEDICATIONS: Monitored anesthesia care TOPICAL ANESTHETIC:  DESCRIPTION OF PROCEDURE: After the risks benefits and alternatives of the procedure were thoroughly explained, informed consent was obtained.  The Pentax Gastroscope [id number] endoscope was introduced through the mouth and advanced to the second portion of the duodenum , Without limitations.  The instrument was slowly withdrawn as the mucosa was fully examined.    STOMACH: Mild erosive gastritis (inflammation) was found in the gastric antrum.   Gastritis (inflammation) was found in the cardia. Except for the findings listed, the EGD was otherwise normal. Retroflexed views revealed no abnormalities.     The scope was then withdrawn from the patient and the procedure completed.  COMPLICATIONS: There were no immediate complications.  ENDOSCOPIC IMPRESSION: 1.   Erosive gastritis (inflammation) was found in the gastric antrum 2.   Gastritis (inflammation) was found in the cardia 3.   EGD was otherwise normal  findings could explain Hemoccult-positive stool  RECOMMENDATIONS: continue PPI Proceed with ERCP  REPEAT EXAM:  eSigned:  Inda Castle, MD 01/29/2015 1:12 PM    CC:

## 2015-01-29 NOTE — Care Management Important Message (Signed)
Important Message  Patient Details  Name: Nicole Roberts MRN: [id number] Date of Birth: 08-May-1921   Medicare Important Message Given:  Yes-third notification given    Shelda Altes 01/29/2015, 3:38 [place] Message  Patient Details  Name: Nicole Roberts MRN: [id number] Date of Birth: Apr 07, 1922   Medicare Important Message Given:  Yes-third notification given    Shelda Altes 01/29/2015, 3:38 PM

## 2015-01-29 NOTE — Anesthesia Postprocedure Evaluation (Signed)
  Anesthesia Post-op Note  Patient: Nicole Roberts  Procedure(s) Performed: Procedure(s) (LRB): ESOPHAGOGASTRODUODENOSCOPY (EGD) WITH PROPOFOL (N/A) ENDOSCOPIC RETROGRADE CHOLANGIOPANCREATOGRAPHY (ERCP) (N/A)  Patient Location: PACU  Anesthesia Type: General  Level of Consciousness: awake and alert   Airway and Oxygen Therapy: Patient Spontanous Breathing  Post-op Pain: mild  Post-op Assessment: Post-op Vital signs reviewed, Patient's Cardiovascular Status Stable--remains in A-fib, Respiratory Function Stable, Patent Airway and No signs of Nausea or vomiting  Last Vitals:  Filed Vitals:   01/29/15 1445  BP: 154/81  Pulse: 109  Temp:   Resp: 21    Post-op Vital Signs: stable   Complications: No apparent anesthesia complications

## 2015-01-29 NOTE — Op Note (Signed)
[facility] [place], [address]   ERCP PROCEDURE REPORT        EXAM DATE: February 21, 2015  PATIENT NAME:          Nicole Roberts, Nicole Roberts          MR #: [id number] BIRTHDATE:       [birth year]     VISIT #:     [phone] ATTENDING:     Inda Castle, MD     STATUS:     outpatient ASSISTANT:      Earline Mayotte, and Javier Docker   INDICATIONS:  The patient is a 81 yr old female here for an ERCP due to PROCEDURE PERFORMED:     ERCP with sphincterotomy/papillotomy ERCP with removal of calculus/calculi MEDICATIONS:     Per Anesthesia  CONSENT: The patient understands the risks and benefits of the procedure and understands that these risks include, but are not limited to: sedation, allergic reaction, infection, perforation and/or bleeding. Alternative means of evaluation and treatment include, among others: physical exam, x-rays, and/or surgical intervention. The patient elects to proceed with this endoscopic procedure.  DESCRIPTION OF PROCEDURE: During intra-op preparation period all mechanical & medical equipment was checked for proper function. Hand hygiene and appropriate measures for infection prevention was taken. After the risks, benefits and alternatives of the procedure were thoroughly explained, Informed was verified, confirmed and timeout was successfully executed by the treatment team. With the patient in left semi-prone position, medications were administered intravenously.The    was passed from the mouth into the esophagus and further advanced from the esophagus into the stomach. From stomach scope was directed to the second portion of the duodenum. Major papilla was aligned with the duodenoscope. The scope position was confirmed fluoroscopically. Rest of the findings/therapeutics are given below. The scope was then completely withdrawn from the patient and the procedure completed. The pulse, BP, and O2 saturation were  monitored and documented by the physician and the nursing staff throughout the entire procedure. The patient was cared for as planned according to standard protocol. The patient was then discharged to recovery in stable condition and with appropriate post procedure care. Estimated blood loss is zero unless otherwise noted in this procedure report.  The common bile duct was selectively cannulated with a 0.35 mm guidewire.  Injection demonstrated several filling defects consistent with bile duct stones.  A 15 mm sphincterotomy was made and several stones were removed with a 12-15 mm balloon stone extractor.  Final cholangiogram was normal.    ADVERSE EVENT: IMPRESSIONS:     The common bile duct was selectively cannulated with a 0.35 mm guidewire.  Injection demonstrated several filling defects consistent with bile duct stones.  A 15 mm sphincterotomy was made and several stones were removed with a 12 mm balloon stone extractor.  Final cholangiogram was normal  RECOMMENDATIONS:     Continue antibiotics REPEAT EXAM:   ___________________________________ Inda Castle, MD eSigned:  Inda Castle, MD 21-Feb-2015 1:47 PM   cc:  CPT CODES: ICD9 CODES:  The ICD and CPT codes recommended by this software are interpretations from the data that the clinical staff has captured with the software.  The verification of the translation of this report to the ICD and CPT codes and modifiers is the sole responsibility of the health care institution and practicing physician where this report was generated.  [employer] will not be held responsible for the validity of the ICD and CPT  codes included on this report.  AMA assumes no liability for data contained or not contained herein. CPT is a Designer, television/film set of the [employer].   PATIENT NAME:  Nicole Roberts, Nicole Roberts MR#: [id number]

## 2015-01-29 NOTE — Transfer of Care (Signed)
Immediate Anesthesia Transfer of Care Note  Patient: Nicole Roberts  Procedure(s) Performed: Procedure(s): ESOPHAGOGASTRODUODENOSCOPY (EGD) WITH PROPOFOL (N/A) ENDOSCOPIC RETROGRADE CHOLANGIOPANCREATOGRAPHY (ERCP) (N/A)  Patient Location: PACU  Anesthesia Type:General  Level of Consciousness:  sedated, patient cooperative and responds to stimulation  Airway & Oxygen Therapy:Patient Spontanous Breathing and Patient connected to face mask oxgen  Post-op Assessment:  Report given to PACU RN and Post -op Vital signs reviewed and stable  Post vital signs:  Reviewed and stable  Last Vitals:  Filed Vitals:   01/29/15 1425  BP: 175/114  Pulse: 129  Temp: 36.9 C  Resp: 24    Complications: No apparent anesthesia complications

## 2015-01-29 NOTE — Anesthesia Procedure Notes (Signed)

## 2015-01-29 NOTE — Progress Notes (Signed)
CSW received call from pt's son today. Son reports that his mother was assisting with transfers at ALF prior to hodpitaliztion. Son questioned if ST Rehab would be available to help progress pt back to prior level of functioning. CSW will discuss this with PT. CSW will continue assisting with d/c planning.  Werner Lean LCSW [phone]

## 2015-01-29 NOTE — Progress Notes (Signed)
Cleaned of large amount  yellow urine. Bed linens and gown changed.

## 2015-01-29 NOTE — Progress Notes (Signed)
Nutrition Follow-up  DOCUMENTATION CODES:   Obesity unspecified  INTERVENTION:  - Continue Ensure Enlive BID - RD will continue to monitor for needs  NUTRITION DIAGNOSIS:   Inadequate oral intake related to other (see comment) (dislike for facility food) as evidenced by per patient/family report.  GOAL:   Patient will meet greater than or equal to 90% of their needs  MONITOR:   PO intake, Supplement acceptance, Weight trends, Labs, I & O's  REASON FOR ASSESSMENT:   Consult Diet education  ASSESSMENT:   81 year old female with past medical history of hypertension, atrial fibrillation (on AC with apixaban), DLD, hypothyroidism, dementia who presented with epigastric pain and nausea but no reports of vomiting. She is from assisted-living facility.  9/29 Pt NPO since midnight for ERCP today. Per chart review, pt ate 0% breakfast and 25% lunch yesterday and 30% lunch 9/26. Son states that pt's appetite has been poor but that she likes dairy items and ate most of a Frosty last night. Encouraged son to order or bring in foods he knows that the pt likes. He states that at lunch yesterday she ate ~1/3 of macaroni and cheese.  Pt reports she has been experiencing heart burn. She states that this sensation is constant in nature but that it gets worse with eating acidic items such as when she has spaghetti with sauce. Pt and son report that pt likes Ensure and has been drinking this.   Not meeting needs. Will continue to monitor for needs after procedure today. Medications reviewed. Labs reviewed; Ca: 8.6 mg/dL.   9/25 - Pt reports poor appetite since admission, pt states she was eating PTA but she really dislikes the food that is provided at her ALF.  - Pt states the food there has caused her to gain weight.  - Pt currently on clear liquid diet. Pt drink supplements at facility, would like them here.  - Pt with recent weight gain per weight history. - Nutrition-Focused physical exam  completed. Findings are no fat depletion, mild muscle depletion, and mild edema.   Diet Order:  Diet NPO time specified  Skin:  Reviewed, no issues  Last BM:  9/28  Height:   Ht Readings from Last 1 Encounters:  01/23/15 5\' 1"  (1.549 m)    Weight:   Wt Readings from Last 1 Encounters:  01/25/15 163 lb 9.3 oz (74.2 kg)    Ideal Body Weight:  47.7 kg  BMI:  Body mass index is 30.92 kg/(m^2).  Estimated Nutritional Needs:   Kcal:  1400-1600  Protein:  65-75g  Fluid:  1.6L/day  EDUCATION NEEDS:   No education needs identified at this time      Jarome Matin, RD, LDN Inpatient Clinical Dietitian Pager # [phone] After hours/weekend pager # [phone]

## 2015-01-29 NOTE — Interval H&P Note (Signed)
History and Physical Interval Note:  01/29/2015 12:36 PM  Nicole Roberts  has presented today for surgery, with the diagnosis of duodenitis, pancreatitis, heme positive stools  The various methods of treatment have been discussed with the patient and family. After consideration of risks, benefits and other options for treatment, the patient has consented to  Procedure(s): ESOPHAGOGASTRODUODENOSCOPY (EGD) WITH PROPOFOL (N/A) ENDOSCOPIC RETROGRADE CHOLANGIOPANCREATOGRAPHY (ERCP) (N/A) as a surgical intervention .  The patient's history has been reviewed, patient examined, no change in status, stable for surgery.  I have reviewed the patient's chart and labs.  Questions were answered to the patient's satisfaction.    The recent H&P (dated **01/28/15*) was reviewed, the patient was examined and there is no change in the patients condition since that H&P was completed.   Erskine Emery  01/29/2015, 12:36 PM     Erskine Emery

## 2015-01-29 NOTE — Progress Notes (Signed)
Patient ID: Nicole Roberts, female   DOB: Sep 16, 1921, 81 y.o.   MRN: [id number]         [facility]    Date of Admission:  01/22/2015   Total days of antibiotics 7        Day 3 amoxicillin          Principal Problem:   Bacteremia due to Escherichia coli Active Problems:   Sepsis due to Escherichia coli (E. coli)   Acute pancreatitis   Duodenitis   Hypothyroidism   Dyslipidemia   Essential hypertension   GERD   Rheumatoid arthritis   Atrial fibrillation   Stage III chronic kidney disease   Depression   Abdominal pain, epigastric   Hypokalemia   Leukocytosis   Abnormal LFTs (liver function tests)   Neuropathy   Chronic combined systolic and diastolic CHF (congestive heart failure)   UTI (urinary tract infection)   Thrombocytopenia   Protein-calorie malnutrition, moderate   Wheelchair bound   Acute encephalopathy   Recurrent cold sores   Edema leg   Diarrhea   Abnormal LFTs   Sepsis   Choledocholithiasis   . acyclovir  400 mg Oral 5 X Daily  . amoxicillin  500 mg Oral 3 times per day  . antiseptic oral rinse  7 mL Mouth Rinse q12n4p  . citalopram  20 mg Oral Daily  . conjugated estrogens  1 Applicatorful Vaginal Once per day on Mon Wed Fri  . feeding supplement (ENSURE ENLIVE)  237 mL Oral BID BM  . fentaNYL  75 mcg Transdermal Q72H  . folic acid  132 mcg Oral q morning - 10a  . furosemide  20 mg Oral Daily  . gabapentin  800 mg Oral QHS  . indomethacin  100 mg Rectal Once  . isosorbide mononitrate  15 mg Oral Daily  . levothyroxine  50 mcg Oral Daily  . magic mouthwash w/lidocaine  10 mL Oral QID  . metoprolol succinate  50 mg Oral BID  . ondansetron      . pantoprazole  40 mg Oral BID  . polyvinyl alcohol  1 drop Both Eyes TID  . potassium chloride  20 mEq Oral TID  . predniSONE  5 mg Oral Q breakfast  . saccharomyces boulardii  250 mg Oral BID  . sucralfate  1 g Oral TID WC & HS     Past Medical History  Diagnosis Date    . HEARING LOSS   . MITRAL VALVE INSUFF&AORTIC VALVE INSUFF     moderate MR  . PULMONARY HYPERTENSION   . Atrial fibrillation     permanent  . SICK SINUS SYNDROME     a. Tachybrady syndrome - [facility] PPM 10/2005.  . Edema 03/19/2009    due to venous insufficiency, chronic lymphedema  . URINARY INCONTINENCE   . Rheumatoid arthritis(714.0) dx clarified 2011    On prednisone  . BREAST CANCER 12/2006    ductal ca s/p L lumpectomy  . Diastolic dysfunction   . HYPERTENSION   . HYPERLIPIDEMIA   . GERD   . HYPOTHYROIDISM   . Hyperparathyroidism     2 lobes removed  . Venous insufficiency     chronic BLE edema  . Spinal stenosis   . Anemia     a. Macrocytic - normal B12, folate 08/2011. b. 3/6 heme positive stools 10/2011 - per PCP note, elected against colonscopy.  [name] TIA (transient ischemic attack) 05/2011  . Anxiety     Social History  Substance Use Topics  . Smoking status: Never Smoker   . Smokeless tobacco: Never Used  . Alcohol Use: No    Family History  Problem Relation Age of Onset  . Ovarian cancer Mother   . Coronary artery disease Sister   . Coronary artery disease Brother   . Hypertension Mother     grandparent  . Lung cancer Father    Allergies  Allergen Reactions  . Sulfa Antibiotics Itching  . Tramadol Hcl Itching and Rash    OBJECTIVE: Filed Vitals:   01/29/15 1430 01/29/15 1445 01/29/15 1500 01/29/15 1512  BP: 173/82 154/81 147/78 161/80  Pulse: 128 109 97 88  Temp:   98.5 F (36.9 C) 98.3 F (36.8 C)  TempSrc:      Resp: 25 21 25 15   Height:      Weight:      SpO2: 99% 94% 99% 98%   Body mass index is 30.92 kg/(m^2).  General: She is sound asleep following her upper endoscopy today  Lab Results Lab Results  Component Value Date   WBC 7.7 01/28/2015   HGB 10.7* 01/28/2015   HCT 32.2* 01/28/2015   MCV 109.2* 01/28/2015   PLT 108* 01/28/2015    Lab Results  Component Value Date   CREATININE 0.82 01/29/2015   BUN 17 01/29/2015   NA  139 01/29/2015   K 4.5 01/29/2015   CL 109 01/29/2015   CO2 22 01/29/2015    Lab Results  Component Value Date   ALT 103* 01/29/2015   AST 23 01/29/2015   ALKPHOS 120 01/29/2015   BILITOT 1.1 01/29/2015     Microbiology: Recent Results (from the past 240 hour(s))  Culture, blood (routine x 2)     Status: None   Collection Time: 01/23/15 10:21 AM  Result Value Ref Range Status   Specimen Description BLOOD RIGHT HAND  Final   Special Requests BOTTLES DRAWN AEROBIC AND ANAEROBIC 10CC  Final   Culture  Setup Time   Final    GRAM NEGATIVE RODS IN BOTH AEROBIC AND ANAEROBIC BOTTLES CRITICAL RESULT CALLED TO, READ BACK BY AND VERIFIED WITH: Eston Esters RN [id number] 01/23/15 A BROWNING    Culture   Final    ESCHERICHIA COLI Performed at [facility]    Report Status 01/25/2015 FINAL  Final   Organism ID, Bacteria ESCHERICHIA COLI  Final      Susceptibility   Escherichia coli - MIC*    AMPICILLIN 8 SENSITIVE Sensitive     CEFAZOLIN <=4 SENSITIVE Sensitive     CEFEPIME <=1 SENSITIVE Sensitive     CEFTAZIDIME <=1 SENSITIVE Sensitive     CEFTRIAXONE <=1 SENSITIVE Sensitive     CIPROFLOXACIN <=0.25 SENSITIVE Sensitive     GENTAMICIN <=1 SENSITIVE Sensitive     IMIPENEM <=0.25 SENSITIVE Sensitive     TRIMETH/SULFA <=20 SENSITIVE Sensitive     AMPICILLIN/SULBACTAM 4 SENSITIVE Sensitive     PIP/TAZO <=4 SENSITIVE Sensitive     * ESCHERICHIA COLI  Culture, blood (routine x 2)     Status: None   Collection Time: 01/23/15 10:28 AM  Result Value Ref Range Status   Specimen Description BLOOD LEFT HAND  Final   Special Requests BOTTLES DRAWN AEROBIC AND ANAEROBIC 10CC  Final   Culture  Setup Time   Final    GRAM NEGATIVE RODS IN BOTH AEROBIC AND ANAEROBIC BOTTLES CRITICAL RESULT CALLED TO, READ BACK BY AND VERIFIED WITHEston Esters RN [id number] 01/23/15 A BROWNING  Culture   Final    ESCHERICHIA COLI SUSCEPTIBILITIES PERFORMED ON PREVIOUS CULTURE WITHIN THE LAST 5 DAYS. Performed at  [facility]    Report Status 01/25/2015 FINAL  Final  Culture, Urine     Status: None   Collection Time: 01/23/15  4:06 PM  Result Value Ref Range Status   Specimen Description URINE, CLEAN CATCH  Final   Special Requests NONE  Final   Culture   Final    >=100,000 COLONIES/mL ESCHERICHIA COLI Performed at [facility]    Report Status 01/26/2015 FINAL  Final   Organism ID, Bacteria ESCHERICHIA COLI  Final      Susceptibility   Escherichia coli - MIC*    AMPICILLIN 8 SENSITIVE Sensitive     CEFAZOLIN <=4 SENSITIVE Sensitive     CEFTRIAXONE <=1 SENSITIVE Sensitive     CIPROFLOXACIN <=0.25 SENSITIVE Sensitive     GENTAMICIN <=1 SENSITIVE Sensitive     IMIPENEM <=0.25 SENSITIVE Sensitive     NITROFURANTOIN <=16 SENSITIVE Sensitive     TRIMETH/SULFA <=20 SENSITIVE Sensitive     AMPICILLIN/SULBACTAM 4 SENSITIVE Sensitive     PIP/TAZO <=4 SENSITIVE Sensitive     * >=100,000 COLONIES/mL ESCHERICHIA COLI  Culture, blood (x 2)     Status: None (Preliminary result)   Collection Time: 01/25/15 12:37 AM  Result Value Ref Range Status   Specimen Description BLOOD RIGHT ARM  Final   Special Requests BOTTLES DRAWN AEROBIC AND ANAEROBIC 5.5CC  Final   Culture   Final    NO GROWTH 4 DAYS Performed at [facility]    Report Status PENDING  Incomplete  Culture, blood (x 2)     Status: None (Preliminary result)   Collection Time: 01/25/15 12:45 AM  Result Value Ref Range Status   Specimen Description BLOOD RIGHT ARM  Final   Special Requests BOTTLES DRAWN AEROBIC ONLY 10CC  Final   Culture   Final    NO GROWTH 4 DAYS Performed at [facility]    Report Status PENDING  Incomplete  C difficile quick scan w PCR reflex     Status: None   Collection Time: 01/28/15  5:18 PM  Result Value Ref Range Status   C Diff antigen NEGATIVE NEGATIVE Final   C Diff toxin NEGATIVE NEGATIVE Final   C Diff interpretation Negative for toxigenic C. difficile  Final      ASSESSMENT: She is improving on therapy for Escherichia coli bacteremia related to her acute pancreatitis and duodenitis precipitated by common bile duct stones. She underwent sphincterotomy and stone extraction this morning. She is now afebrile. Her C. difficile screen was negative. She needs 1 more week of amoxicillin.  PLAN: 1. Continue amoxicillin for one more week 2. I will sign off now  Michel Bickers, MD [facility] for [place] [phone] pager   [phone] cell 01/29/2015, 4:05 PM

## 2015-01-29 NOTE — Anesthesia Preprocedure Evaluation (Addendum)
Anesthesia Evaluation  Patient identified by MRN, date of birth, ID band Patient awake    Reviewed: Allergy & Precautions, NPO status , Patient's Chart, lab work & pertinent test results, reviewed documented beta blocker date and time   Airway Mallampati: II  TM Distance: >3 FB Neck ROM: Full    Dental  (+) Dental Advisory Given, Partial Lower, Partial Upper   Pulmonary  Pulmonary hypertension   Pulmonary exam normal breath sounds clear to auscultation       Cardiovascular Exercise Tolerance: Poor hypertension, Pt. on medications and Pt. on home beta blockers + Peripheral Vascular Disease and +CHF  Normal cardiovascular exam+ dysrhythmias Atrial Fibrillation + pacemaker + Valvular Problems/Murmurs AI and MR  Rhythm:Irregular  Irregularly irregular HR  TTE: Study Conclusions  - Left ventricle: The cavity size was normal. There was mild concentric hypertrophy. Systolic function was mildly to moderately reduced. The estimated ejection fraction was approximately 45%. There is akinesis of the mid-distalanteroseptal and apical myocardium. The study is not technically sufficient to allow evaluation of LV diastolic function. Rhythm appears to be atrial fibrillation. - Aortic valve: Trileaflet; mildly calcified leaflets. No significant regurgitation. - Mitral valve: Mildly calcified annulus. Mild regurgitation. - Left atrium: The atrium was moderately dilated. - Right ventricle: Pacer wire or catheter noted in right ventricle. - Right atrium: The atrium was moderately dilated. Central venous pressure: 54mm Hg (est). - Tricuspid valve: Moderate regurgitation. - Pulmonary arteries: Systolic pressure was moderately increased. PA peak pressure: 43mm Hg (S). - Pericardium, extracardiac: There was no pericardial effusion. Impressions:  - Comparison to prior study December 2013. There is mild LVH with  LVEF approximately 45%, mid to distal anteroseptal akinesis noted - new compared to prior study. Indeterminate diastolic function. Moderate biatrial enlargement. Mild mitral and moderate tricuspid regurgitation. Pacer wire in right heart. PASP 48 mmHg and CVP elevated.   Neuro/Psych PSYCHIATRIC DISORDERS Anxiety Depression TIA   GI/Hepatic Neg liver ROS, GERD  Medicated and Controlled,  Endo/Other  Hypothyroidism Obesity   Renal/GU Renal InsufficiencyRenal disease     Musculoskeletal  (+) Arthritis ,   Abdominal   Peds  Hematology negative hematology ROS (+) Blood dyscrasia, anemia ,   Anesthesia Other Findings Day of surgery medications reviewed with the patient.  Reproductive/Obstetrics                        Anesthesia Physical Anesthesia Plan  ASA: III  Anesthesia Plan: MAC   Post-op Pain Management:    Induction: Intravenous  Airway Management Planned: Simple Face Mask  Additional Equipment:   Intra-op Plan:   Post-operative Plan:   Informed Consent: I have reviewed the patients History and Physical, chart, labs and discussed the procedure including the risks, benefits and alternatives for the proposed anesthesia with the patient or authorized representative who has indicated his/her understanding and acceptance.   Dental advisory given  Plan Discussed with: CRNA  Anesthesia Plan Comments: (Risks/benefits of general anesthesia discussed with patient including risk of damage to teeth, lips, gum, and tongue, nausea/vomiting, allergic reactions to medications, and the possibility of heart attack, stroke and death.  All patient questions answered.  Patient wishes to proceed.)       Anesthesia Quick Evaluation

## 2015-01-29 NOTE — Progress Notes (Signed)
[facility] PROGRESS NOTE    Progress Note   Nicole Roberts TZG:[id number] DOB: 06-04-1921 DOA: 01/22/2015 PCP: Gwendolyn Grant, MD   Brief Narrative:   Nicole Roberts is an 81 y.o. female with past medical history of essential hypertension peptic ulcer disease atrial fibrillation on apixiban and dementia who comes in complaining of epigastric pain and nausea but no vomiting. On admission she was found to have acute pancreatitis with duodenitis on CT scan. Her lipase was 1692 with an elevation of her liver enzymes including a bilirubin of 2.8 abdominal ultrasound showed no acute findings, she was also found to be bacteremic with 2 x 2 blood culture showing Escherichia coli she had been started on empiric antibiotics. ID was consulted and recommended to consider which are to amoxicillin.  Assessment/Plan:   Acute pancreatitis and duodenitis: Initial CT scan performed on 01/22/2015 show inflammation and dilation of pancreas and duodenum. GI was consulted who recommended an MRCP which cannot be done due to her pacemaker not being compatible. Eluquis was stopped in anticipation for EGD on 01/29/2015. Appreciate GIs assistance. She continues to be on acyclovir. We'll add Carafate to her regimen, continue Protonix, will add florastor regimen.  Escherichia coli bacteremia/Sepsis: Probably translocation from her acute duodenitis, she was started empirically on admission and Zosyn now transitioned to oral amoxicillin which she will continue for 9 additional days. She is at high risk of developing C. difficile she is on antibiotics, is  at the hospital and its on Protonix. Will add Carafate to her regimen.  Elevated LFTs: Abdominal ultrasound showed no stone, LFTs are improving. This could likely be from Escherichia coli bacteremia Right lower extremity edema: Doppler was negative for DVT.  Hypothyroidism: Continue Synthroid.  Dyslipidemia: Statins were held due to elevation of  LFTs.  Essential hypertension; continue current regimen seems to be stable.  Rheumatoid arthritis: Continue Synthroid.  Stage III chronic kidney disease: Currently at baseline.  Depression: Continue Celexa.  Chronic combined systolic and diastolic heart failure: She seems to be compensated.  Thrombocytopenia: Slowly trending up likely due to infectious etiology.  Chronic atrial fibrillation: Continue metoprolol, Eluquis on hold.  Severe protein caloric no much edition: Continue supplementation.  Acute encephalopathy: Likely due to infectious etiology now resolved.       DVT Prophylaxis - Lovenox ordered.  Family Communication: son Disposition Plan: Home when stable. Code Status:     Code Status Orders        Start     Ordered   01/23/15 0313  Do not attempt resuscitation (DNR)   Continuous    Question Answer Comment  In the event of cardiac or respiratory ARREST Do not call a "code blue"   In the event of cardiac or respiratory ARREST Do not perform Intubation, CPR, defibrillation or ACLS   In the event of cardiac or respiratory ARREST Use medication by any route, position, wound care, and other measures to relive pain and suffering. May use oxygen, suction and manual treatment of airway obstruction as needed for comfort.   Comments Noted most recently in 8/2 PMR note, also noted during 2014 admit.      01/23/15 0314    Advance Directive Documentation        Most Recent Value   Type of Advance Directive  Out of facility DNR (pink MOST or yellow form)   Pre-existing out of facility DNR order (yellow form or pink MOST form)  Yellow form placed in chart (order not valid for inpatient use)   "  MOST" Form in Place?          IV Access:    Peripheral IV   Procedures and diagnostic studies:   Mr Attempted Study-no Report  01/27/2015   This examination belongs to an outside facility and is stored  here for comparison purposes only.  Contact the  originating outside  institution for any associated report or interpretation.    Medical Consultants:    None.  Anti-Infectives:   Anti-infectives    Start     Dose/Rate Route Frequency Ordered Stop   01/28/15 0900  amoxicillin (AMOXIL) capsule 500 mg     500 mg Oral 3 times per day 01/28/15 0811     01/26/15 2000  ciprofloxacin (CIPRO) tablet 500 mg  Status:  Discontinued     500 mg Oral 2 times daily 01/26/15 1102 01/28/15 0811   01/26/15 1400  metroNIDAZOLE (FLAGYL) tablet 500 mg  Status:  Discontinued     500 mg Oral 3 times per day 01/26/15 1102 01/28/15 0811   01/26/15 1000  cefTRIAXone (ROCEPHIN) 2 g in dextrose 5 % 50 mL IVPB  Status:  Discontinued     2 g 100 mL/hr over 30 Minutes Intravenous Every 24 hours 01/26/15 0801 01/26/15 1102   01/25/15 1930  acyclovir (ZOVIRAX) tablet 400 mg     400 mg Oral 5 times daily 01/25/15 1921 02/01/15 1759   01/24/15 2100  piperacillin-tazobactam (ZOSYN) IVPB 3.375 g  Status:  Discontinued     3.375 g 12.5 mL/hr over 240 Minutes Intravenous Every 8 hours 01/24/15 1012 01/26/15 0801   01/24/15 1030  piperacillin-tazobactam (ZOSYN) IVPB 3.375 g     3.375 g 100 mL/hr over 30 Minutes Intravenous STAT 01/24/15 1010 01/24/15 1333   01/23/15 2300  cefTRIAXone (ROCEPHIN) 1 g in dextrose 5 % 50 mL IVPB  Status:  Discontinued     1 g 100 mL/hr over 30 Minutes Intravenous Every 24 hours 01/23/15 2255 01/24/15 0962      Subjective:    Kanani Mowbray she relates her abdomen is bothering her. Especially with foods.  Objective:    Filed Vitals:   01/28/15 0605 01/28/15 1535 01/28/15 2115 01/29/15 0602  BP: 170/88 137/61 170/76 158/79  Pulse: 82 80 91 80  Temp: 98.5 F (36.9 C) 98.2 F (36.8 C) 98 F (36.7 C) 98.1 F (36.7 C)  TempSrc: Oral Oral Oral Oral  Resp: 16 18 17 16   Height:      Weight:      SpO2: 100% 100% 98% 99%    Intake/Output Summary (Last 24 hours) at 01/29/15 0813 Last data filed at 01/29/15 0600  Gross per  24 hour  Intake   2940 ml  Output      0 ml  Net   2940 ml   Filed Weights   01/23/15 0400 01/23/15 1840 01/25/15 0659  Weight: 67.495 kg (148 lb 12.8 oz) 73.7 kg (162 lb 7.7 oz) 74.2 kg (163 lb 9.3 oz)    Exam: Gen:  NAD Cardiovascular:  RRR,  Respiratory:  Good air movement and clear to auscultation Gastrointestinal:  Abdomen soft, with epigastric tenderness no rebound or guarding positive bowel sounds Extremities:  No edema   Data Reviewed:    Labs: Basic Metabolic Panel:  Recent Labs Lab 01/23/15 0042 01/26/15 0522 01/27/15 0438 01/27/15 0835 01/28/15 0515 01/29/15 0550  NA 141 141 139  --  141 139  K 3.4* 3.3* 3.4*  --  4.1 4.5  CL 106 109  112*  --  111 109  CO2 26 24 23   --  23 22  GLUCOSE 78 90 82  --  85 86  BUN 25* 22* 19  --  20 17  CREATININE 1.15* 1.02* 0.89  --  0.87 0.82  CALCIUM 8.9 7.8* 7.7*  --  8.5* 8.6*  MG  --   --   --  1.3*  --   --    GFR Estimated Creatinine Clearance: 42 mL/min (by C-G formula based on Cr of 0.82). Liver Function Tests:  Recent Labs Lab 01/23/15 0042 01/26/15 0522 01/27/15 0438 01/28/15 0515 01/29/15 0550  AST 813* 49* 30 25 23   ALT 461* 200* 147* 129* 103*  ALKPHOS 214* 149* 129* 131* 120  BILITOT 2.8* 1.6* 1.0 1.0 1.1  PROT 6.7 5.5* 5.4* 5.9* 5.8*  ALBUMIN 3.6 2.8* 2.7* 2.8* 2.9*    Recent Labs Lab 01/23/15 0042 01/23/15 0900 01/26/15 0522 01/28/15 0515  LIPASE 1692* 720* 44 41   No results for input(s): AMMONIA in the last 168 hours. Coagulation profile No results for input(s): INR, PROTIME in the last 168 hours.  CBC:  Recent Labs Lab 01/23/15 0042 01/26/15 0522 01/27/15 0835 01/28/15 0515  WBC 14.4* 8.8 8.0 7.7  NEUTROABS 13.5*  --   --  5.7  HGB 12.3 9.6* 10.4* 10.7*  HCT 37.8 28.0* 31.2* 32.2*  MCV 112.5* 110.2* 111.0* 109.2*  PLT 144* 88* 102* 108*   Cardiac Enzymes: No results for input(s): CKTOTAL, CKMB, CKMBINDEX, TROPONINI in the last 168 hours. BNP (last 3 results) No  results for input(s): PROBNP in the last 8760 hours. CBG: No results for input(s): GLUCAP in the last 168 hours. D-Dimer: No results for input(s): DDIMER in the last 72 hours. Hgb A1c: No results for input(s): HGBA1C in the last 72 hours. Lipid Profile: No results for input(s): CHOL, HDL, LDLCALC, TRIG, CHOLHDL, LDLDIRECT in the last 72 hours. Thyroid function studies: No results for input(s): TSH, T4TOTAL, T3FREE, THYROIDAB in the last 72 hours.  Invalid input(s): FREET3 Anemia work up: No results for input(s): VITAMINB12, FOLATE, FERRITIN, TIBC, IRON, RETICCTPCT in the last 72 hours. Sepsis Labs:  Recent Labs Lab 01/23/15 0042 01/24/15 1041 01/24/15 1325 01/26/15 0522 01/27/15 0835 01/28/15 0515  PROCALCITON  --  21.54  --   --   --   --   WBC 14.4*  --   --  8.8 8.0 7.7  LATICACIDVEN  --  1.4 1.7  --   --   --    Microbiology Recent Results (from the past 240 hour(s))  Culture, blood (routine x 2)     Status: None   Collection Time: 01/23/15 10:21 AM  Result Value Ref Range Status   Specimen Description BLOOD RIGHT HAND  Final   Special Requests BOTTLES DRAWN AEROBIC AND ANAEROBIC 10CC  Final   Culture  Setup Time   Final    GRAM NEGATIVE RODS IN BOTH AEROBIC AND ANAEROBIC BOTTLES CRITICAL RESULT CALLED TO, READ BACK BY AND VERIFIED WITH: Eston Esters RN [id number] 01/23/15 A BROWNING    Culture   Final    ESCHERICHIA COLI Performed at [facility]    Report Status 01/25/2015 FINAL  Final   Organism ID, Bacteria ESCHERICHIA COLI  Final      Susceptibility   Escherichia coli - MIC*    AMPICILLIN 8 SENSITIVE Sensitive     CEFAZOLIN <=4 SENSITIVE Sensitive     CEFEPIME <=1 SENSITIVE Sensitive  CEFTAZIDIME <=1 SENSITIVE Sensitive     CEFTRIAXONE <=1 SENSITIVE Sensitive     CIPROFLOXACIN <=0.25 SENSITIVE Sensitive     GENTAMICIN <=1 SENSITIVE Sensitive     IMIPENEM <=0.25 SENSITIVE Sensitive     TRIMETH/SULFA <=20 SENSITIVE Sensitive     AMPICILLIN/SULBACTAM 4  SENSITIVE Sensitive     PIP/TAZO <=4 SENSITIVE Sensitive     * ESCHERICHIA COLI  Culture, blood (routine x 2)     Status: None   Collection Time: 01/23/15 10:28 AM  Result Value Ref Range Status   Specimen Description BLOOD LEFT HAND  Final   Special Requests BOTTLES DRAWN AEROBIC AND ANAEROBIC 10CC  Final   Culture  Setup Time   Final    GRAM NEGATIVE RODS IN BOTH AEROBIC AND ANAEROBIC BOTTLES CRITICAL RESULT CALLED TO, READ BACK BY AND VERIFIED WITH: Richard Miu DYKE RN [id number] 01/23/15 A BROWNING    Culture   Final    ESCHERICHIA COLI SUSCEPTIBILITIES PERFORMED ON PREVIOUS CULTURE WITHIN THE LAST 5 DAYS. Performed at [facility]    Report Status 01/25/2015 FINAL  Final  Culture, Urine     Status: None   Collection Time: 01/23/15  4:06 PM  Result Value Ref Range Status   Specimen Description URINE, CLEAN CATCH  Final   Special Requests NONE  Final   Culture   Final    >=100,000 COLONIES/mL ESCHERICHIA COLI Performed at [facility]    Report Status 01/26/2015 FINAL  Final   Organism ID, Bacteria ESCHERICHIA COLI  Final      Susceptibility   Escherichia coli - MIC*    AMPICILLIN 8 SENSITIVE Sensitive     CEFAZOLIN <=4 SENSITIVE Sensitive     CEFTRIAXONE <=1 SENSITIVE Sensitive     CIPROFLOXACIN <=0.25 SENSITIVE Sensitive     GENTAMICIN <=1 SENSITIVE Sensitive     IMIPENEM <=0.25 SENSITIVE Sensitive     NITROFURANTOIN <=16 SENSITIVE Sensitive     TRIMETH/SULFA <=20 SENSITIVE Sensitive     AMPICILLIN/SULBACTAM 4 SENSITIVE Sensitive     PIP/TAZO <=4 SENSITIVE Sensitive     * >=100,000 COLONIES/mL ESCHERICHIA COLI  Culture, blood (x 2)     Status: None (Preliminary result)   Collection Time: 01/25/15 12:37 AM  Result Value Ref Range Status   Specimen Description BLOOD RIGHT ARM  Final   Special Requests BOTTLES DRAWN AEROBIC AND ANAEROBIC 5.5CC  Final   Culture   Final    NO GROWTH 3 DAYS Performed at [facility]    Report Status PENDING  Incomplete    Culture, blood (x 2)     Status: None (Preliminary result)   Collection Time: 01/25/15 12:45 AM  Result Value Ref Range Status   Specimen Description BLOOD RIGHT ARM  Final   Special Requests BOTTLES DRAWN AEROBIC ONLY 10CC  Final   Culture   Final    NO GROWTH 3 DAYS Performed at [facility]    Report Status PENDING  Incomplete  C difficile quick scan w PCR reflex     Status: None   Collection Time: 01/28/15  5:18 PM  Result Value Ref Range Status   C Diff antigen NEGATIVE NEGATIVE Final   C Diff toxin NEGATIVE NEGATIVE Final   C Diff interpretation Negative for toxigenic C. difficile  Final     Medications:   . acyclovir  400 mg Oral 5 X Daily  . amoxicillin  500 mg Oral 3 times per day  . antiseptic oral rinse  7 mL Mouth Rinse q12n4p  . citalopram  20 mg Oral Daily  . conjugated estrogens  1 Applicatorful Vaginal Once per day on Mon Wed Fri  . feeding supplement (ENSURE ENLIVE)  237 mL Oral BID BM  . fentaNYL  75 mcg Transdermal Q72H  . folic acid  102 mcg Oral q morning - 10a  . furosemide  20 mg Oral Daily  . gabapentin  800 mg Oral QHS  . isosorbide mononitrate  15 mg Oral Daily  . levothyroxine  50 mcg Oral Daily  . magic mouthwash w/lidocaine  10 mL Oral QID  . metoprolol succinate  50 mg Oral BID  . pantoprazole  40 mg Oral BID  . polyvinyl alcohol  1 drop Both Eyes TID  . potassium chloride  20 mEq Oral TID  . predniSONE  5 mg Oral Q breakfast  . saccharomyces boulardii  250 mg Oral BID  . sucralfate  1 g Oral TID WC & HS   Continuous Infusions: . sodium chloride 75 mL/hr at 01/28/15 1802    Time spent: 25 min   LOS: 6 days   Charlynne Cousins  [facility] Pager [phone]  *Please refer to [email or web address], password [email or web address] to get updated schedule on who will round on this patient, as hospitalists switch teams weekly. If 7PM-7AM, please contact night-coverage at [email or web address], password [email or web address] for any overnight needs.  01/29/2015, 8:13 AM

## 2015-01-29 NOTE — H&P (View-Only) (Signed)
     [facility] Progress Note  Subjective:  T bili today 1.o. Alk phos 131, ALT 129, AST 25. WBC 7.7.  Eliquis stopped yesterday. Pt was unable to have MRCP due to pacemaker. Denies abd pain, but is fearful of "having another episode of that terrible pain".Had e coli bacteremia and was on zosyn, but currently on amoxil.Heme positive stool on exam yesterday.   Objective:  Vital signs in last 24 hours: Temp:  [98.1 F (36.7 C)-98.5 F (36.9 C)] 98.5 F (36.9 C) (09/28 0605) Pulse Rate:  [79-86] 82 (09/28 0605) Resp:  [16-18] 16 (09/28 0605) BP: (141-170)/(62-88) 170/88 mmHg (09/28 0605) SpO2:  [99 %-100 %] 100 % (09/28 0605) Last BM Date: 01/26/15 General:   Alert,  Well-developed, in NAD Heart:  2/6 SEM Pulm;lungs clear Abdomen:  Soft, mild epigastric tenderness to palpation,  Normal bowel sounds, without guarding, and without rebound.   Extremities:  1-2+ edema LE. Neurologic: Alert and  oriented x4;  grossly normal neurologically. Psych: Alert and cooperative. Normal mood and affect.  Intake/Output from previous day: 09/27 0701 - 09/28 0700 In: 975 [I.V.:975] Out: 300 [Urine:300] Intake/Output this shift:    Lab Results:  Recent Labs  01/26/15 0522 01/27/15 0835 01/28/15 0515  WBC 8.8 8.0 7.7  HGB 9.6* 10.4* 10.7*  HCT 28.0* 31.2* 32.2*  PLT 88* 102* 108*   BMET  Recent Labs  01/26/15 0522 01/27/15 0438 01/28/15 0515  NA 141 139 141  K 3.3* 3.4* 4.1  CL 109 112* 111  CO2 $Re'24 23 23  'xjY$ GLUCOSE 90 82 85  BUN 22* 19 20  CREATININE 1.02* 0.89 0.87  CALCIUM 7.8* 7.7* 8.5*   LFT  Recent Labs  01/28/15 0515  PROT 5.9*  ALBUMIN 2.8*  AST 25  ALT 129*  ALKPHOS 131*  BILITOT 1.0    ASSESSMENT/PLAN:   Acute pancreatitis, suspected biliary etiology. R/O CBD stone(s). MRCP not able to be performed due to pacemaker. LFts improving. Eliquis  Stopped yesterday. Heme positive stools. Will discuss with Dr Deatra Ina as to plans for EGD or  ERCP.     LOS: 5 days   Hvozdovic, Vita Barley PA-C 01/28/2015, Pager [phone]

## 2015-01-30 ENCOUNTER — Encounter: Payer: Self-pay | Admitting: Physician Assistant

## 2015-01-30 DIAGNOSIS — K8050 Calculus of bile duct without cholangitis or cholecystitis without obstruction: Secondary | ICD-10-CM

## 2015-01-30 DIAGNOSIS — K2970 Gastritis, unspecified, without bleeding: Secondary | ICD-10-CM

## 2015-01-30 DIAGNOSIS — K2990 Gastroduodenitis, unspecified, without bleeding: Secondary | ICD-10-CM

## 2015-01-30 DIAGNOSIS — K8309 Other cholangitis: Secondary | ICD-10-CM

## 2015-01-30 DIAGNOSIS — K830 Cholangitis: Secondary | ICD-10-CM

## 2015-01-30 LAB — COMPREHENSIVE METABOLIC PANEL
ALT: 79 U/L — ABNORMAL HIGH (ref 14–54)
ANION GAP: 6 (ref 5–15)
AST: 21 U/L (ref 15–41)
Albumin: 2.7 g/dL — ABNORMAL LOW (ref 3.5–5.0)
Alkaline Phosphatase: 101 U/L (ref 38–126)
BILIRUBIN TOTAL: 0.9 mg/dL (ref 0.3–1.2)
BUN: 17 mg/dL (ref 6–20)
CO2: 22 mmol/L (ref 22–32)
Calcium: 8.3 mg/dL — ABNORMAL LOW (ref 8.9–10.3)
Chloride: 110 mmol/L (ref 101–111)
Creatinine, Ser: 0.94 mg/dL (ref 0.44–1.00)
GFR calc Af Amer: >60 mL/min (ref >60)
GFR, EST NON AFRICAN AMERICAN: 52 mL/min — AB (ref >60)
Glucose, Bld: 82 mg/dL (ref 65–99)
POTASSIUM: 5.3 mmol/L — AB (ref 3.5–5.1)
Sodium: 138 mmol/L (ref 135–145)
TOTAL PROTEIN: 5.3 g/dL — AB (ref 6.5–8.1)

## 2015-01-30 LAB — CULTURE, BLOOD (ROUTINE X 2)
CULTURE: NO GROWTH
CULTURE: NO GROWTH

## 2015-01-30 LAB — CBC
HEMATOCRIT: 29.7 % — AB (ref 36.0–46.0)
Hemoglobin: 9.6 g/dL — ABNORMAL LOW (ref 12.0–15.0)
MCH: 36.2 pg — ABNORMAL HIGH (ref 26.0–34.0)
MCHC: 32.3 g/dL (ref 30.0–36.0)
MCV: 112.1 fL — AB (ref 78.0–100.0)
Platelets: 137 10*3/uL — ABNORMAL LOW (ref 150–400)
RBC: 2.65 MIL/uL — ABNORMAL LOW (ref 3.87–5.11)
RDW: 16.7 % — AB (ref 11.5–15.5)
WBC: 7 10*3/uL (ref 4.0–10.5)

## 2015-01-30 LAB — LIPASE, BLOOD: LIPASE: 20 U/L — AB (ref 22–51)

## 2015-01-30 MED ORDER — ALUM & MAG HYDROXIDE-SIMETH 200-200-20 MG/5ML PO SUSP: 30.0000 mL | Freq: Four times a day (QID) | ORAL | Status: DC | PRN

## 2015-01-30 MED ORDER — CALCIUM CITRATE-VITAMIN D 315-200 MG-UNIT PO TABS: 1.0000 | ORAL_TABLET | Freq: Two times a day (BID) | ORAL | Status: DC

## 2015-01-30 MED ORDER — VITAMIN B-12 1000 MCG PO TABS
500.0000 ug | ORAL_TABLET | Freq: Every day | ORAL | Status: DC
  Administered 2015-01-30 – 2015-01-31 (×2): 500 ug via ORAL
  Filled 2015-01-30 (×3): qty 0.5

## 2015-01-30 MED ORDER — HYDROCODONE-ACETAMINOPHEN 5-325 MG PO TABS: 1.0000 | ORAL_TABLET | Freq: Four times a day (QID) | ORAL | Status: DC | PRN

## 2015-01-30 MED ORDER — SODIUM POLYSTYRENE SULFONATE 15 GM/60ML PO SUSP
15.0000 g | Freq: Once | ORAL | Status: AC
  Administered 2015-01-30: 15 g via ORAL
  Filled 2015-01-30: qty 60

## 2015-01-30 MED ORDER — SODIUM CHLORIDE 0.9 % IV SOLN: Freq: Once | INTRAVENOUS | Status: DC

## 2015-01-30 MED ORDER — FUROSEMIDE 20 MG PO TABS
20.0000 mg | ORAL_TABLET | Freq: Every day | ORAL | Status: DC
  Administered 2015-01-30 – 2015-01-31 (×2): 20 mg via ORAL
  Filled 2015-01-30 (×3): qty 1

## 2015-01-30 MED ORDER — NITROGLYCERIN 0.4 MG SL SUBL: 0.4000 mg | SUBLINGUAL_TABLET | SUBLINGUAL | Status: DC | PRN

## 2015-01-30 MED ORDER — APIXABAN 2.5 MG PO TABS: 2.5000 mg | ORAL_TABLET | Freq: Two times a day (BID) | ORAL | Status: DC

## 2015-01-30 MED ORDER — FENTANYL 75 MCG/HR TD PT72: 75.0000 ug | MEDICATED_PATCH | TRANSDERMAL | Status: DC

## 2015-01-30 MED ORDER — CALCIUM CARBONATE-VITAMIN D 500-200 MG-UNIT PO TABS
1.0000 | ORAL_TABLET | Freq: Two times a day (BID) | ORAL | Status: DC
  Administered 2015-01-30 – 2015-01-31 (×3): 1 via ORAL
  Filled 2015-01-30 (×5): qty 1

## 2015-01-30 MED ORDER — PANTOPRAZOLE SODIUM 40 MG PO TBEC: 40.0000 mg | DELAYED_RELEASE_TABLET | Freq: Two times a day (BID) | ORAL | Status: DC

## 2015-01-30 MED ORDER — VITAMIN D3 25 MCG (1000 UNIT) PO TABS
1000.0000 [IU] | ORAL_TABLET | Freq: Every day | ORAL | Status: DC
  Administered 2015-01-30 – 2015-01-31 (×2): 1000 [IU] via ORAL
  Filled 2015-01-30 (×3): qty 1

## 2015-01-30 MED ORDER — ADULT MULTIVITAMIN LIQUID CH
5.0000 mL | Freq: Every day | ORAL | Status: DC
  Administered 2015-01-30 – 2015-01-31 (×2): 5 mL via ORAL
  Filled 2015-01-30 (×3): qty 5

## 2015-01-30 MED ORDER — POTASSIUM CHLORIDE ER 10 MEQ PO TBCR: 10.0000 meq | EXTENDED_RELEASE_TABLET | Freq: Every day | ORAL | Status: DC

## 2015-01-30 MED ORDER — FUROSEMIDE 10 MG/ML IJ SOLN
20.0000 mg | Freq: Once | INTRAMUSCULAR | Status: AC
  Administered 2015-01-30: 20 mg via INTRAVENOUS
  Filled 2015-01-30: qty 2

## 2015-01-30 MED ORDER — ATORVASTATIN CALCIUM 10 MG PO TABS
10.0000 mg | ORAL_TABLET | Freq: Every day | ORAL | Status: DC
  Administered 2015-01-30 – 2015-01-31 (×2): 10 mg via ORAL
  Filled 2015-01-30 (×3): qty 1

## 2015-01-30 MED ORDER — AMOXICILLIN 500 MG PO CAPS: 500.0000 mg | ORAL_CAPSULE | Freq: Three times a day (TID) | ORAL | Status: DC

## 2015-01-30 NOTE — Discharge Summary (Signed)
Physician Discharge Summary  Nicole Roberts VZC:[id number] DOB: October 28, 1943 DOA: 01/22/2015  PCP: Gwendolyn Grant, MD  Admit date: 01/22/2015 Discharge date: 01/30/2015  Time spent: 35 minutes  Recommendations for Outpatient Follow-up:  1. Patient will need to follow-up with GI in 2-4 weeks 2. Will need a CBC in 1 week.  Discharge Diagnoses:  Principal Problem:   Bacteremia due to Escherichia coli Active Problems:   Hypothyroidism   Dyslipidemia   Essential hypertension   GERD   Rheumatoid arthritis   Atrial fibrillation   Stage III chronic kidney disease   Depression   Acute pancreatitis   Abdominal pain, epigastric   Hypokalemia   Leukocytosis   Abnormal LFTs (liver function tests)   Neuropathy   Chronic combined systolic and diastolic CHF (congestive heart failure)   UTI (urinary tract infection)   Sepsis due to Escherichia coli (E. coli)   Thrombocytopenia   Protein-calorie malnutrition, moderate   Wheelchair bound   Acute encephalopathy   Recurrent cold sores   Duodenitis   Edema leg   Diarrhea   Abnormal LFTs   Sepsis   Choledocholithiasis   Gastritis and gastroduodenitis   Possible Acute cholangitis   Discharge Condition: stable  Diet recommendation: heart healthy  Filed Weights   01/23/15 0400 01/23/15 1840 01/25/15 0659  Weight: 67.495 kg (148 lb 12.8 oz) 73.7 kg (162 lb 7.7 oz) 74.2 kg (163 lb 9.3 oz)    History of present illness:  81 year old that comes in complaining to the ED with abdominal pain for the past couple of days worsening in frequency with some nausea but no vomiting. She denies any black stools or red stools.  Hospital Course:  Acute pancreatitis/gastritis and to duodenitis/with possible acute cholangitis: Initial CT scan of the abdomen and pelvis performed on 01/22/2015 show inflammation and dilation of the pancreas and duodenum. GI was consulted who recommended an EGD and ERCP, as the patient cannot tolerate an MRCP due to her  pacemaker. Eluquis was stopped in anticipation of EGD. This was performed on 01/29/2015 that showed mild gastritis. She was started on Protonix by mouth twice a day. An ERCP was done that showed multiple filling defects in [place] was performed and several stones were removed. She remained afebrile during this time as she was started on antibiotics on admission this was D escalated to amoxicillin we she will continue for a week.  Escherichia coli bacteremia/sepsis: She was started empirically on antibiotics IV Zosyn IV was consulted once blood cultures came back positive for Escherichia coli, and she was transitioned to amoxicillin. Due to her high risk of developing diarrhea C. difficile PCR was checked which was negative. She was continue on Protonix and Florastor. ID recommended to continue amoxicillin for 1 additional week.  Elevated LFTs: Abdominal ultrasound showed no stone LFTs were improving. ERCP was performed as above. Right lower extremity Doppler was negative for DVT.  Hypothyroidism: Continue Synthroid.  Dyslipidemia: Continue statins.  Chronic atrial fibrillation: Heart rate controlled metoprolol, Eluquis was held for EGD. It will be started in a week.  Acute encephalopathy: Likely due to infectious etiology now resolved.   Procedures:  EGD on 01/29/2015 that showed gastritis.  ERCP: On 01/29/2015 that shows several filling defect sphincterotomy and several stones removed.  Lower extremity Dopplers that was negative for DVT.  Consultations:  GI  ID  Discharge Exam: Filed Vitals:   01/30/15 0622  BP: 119/60  Pulse: 95  Temp: 98.3 F (36.8 C)  Resp: 18    General:  She is awake alert and oriented 3 Cardiovascular: Irregular rate and rhythm Respiratory: Good air movement clear to auscultation  Discharge Instructions   Discharge Instructions    Diet - low sodium heart healthy    Complete by:  As directed      Increase activity slowly     Complete by:  As directed           Current Discharge Medication List    START taking these medications   Details  amoxicillin (AMOXIL) 500 MG capsule Take 1 capsule (500 mg total) by mouth every 8 (eight) hours. Qty: 7 capsule, Refills: 0      CONTINUE these medications which have CHANGED   Details  apixaban (ELIQUIS) 2.5 MG TABS tablet Take 1 tablet (2.5 mg total) by mouth 2 (two) times daily. Qty: 60 tablet, Refills: 11    fentaNYL (DURAGESIC - DOSED MCG/HR) 75 MCG/HR Place 1 patch (75 mcg total) onto the skin every 3 (three) days. Qty: 10 patch, Refills: 0    pantoprazole (PROTONIX) 40 MG tablet Take 1 tablet (40 mg total) by mouth 2 (two) times daily. Qty: 60 tablet, Refills: 3    potassium chloride (K-DUR) 10 MEQ tablet Take 1 tablet (10 mEq total) by mouth daily. Qty: 30 tablet, Refills: 3      CONTINUE these medications which have NOT CHANGED   Details  alum & mag hydroxide-simeth (MAALOX/MYLANTA) 200-200-20 MG/5ML suspension Take 30 mLs by mouth every 6 (six) hours as needed for indigestion or heartburn.    Artificial Tear Ointment (ARTIFICIAL TEARS) ointment Place 1 drop into both eyes 3 (three) times daily.    atorvastatin (LIPITOR) 10 MG tablet Take 10 mg by mouth at bedtime.    calcium citrate-vitamin D (CITRACAL+D) 315-200 MG-UNIT per tablet Take 1 tablet by mouth 2 (two) times daily.    cholecalciferol (VITAMIN D) 1000 UNITS tablet Take 1,000 Units by mouth daily.    citalopram (CELEXA) 20 MG tablet Take 20 mg by mouth daily.    conjugated estrogens (PREMARIN) vaginal cream Place 1 Applicatorful vaginally 3 (three) times a week.    folic acid (FOLVITE) 259 MCG tablet Take 400 mcg by mouth every morning.    furosemide (LASIX) 20 MG tablet Take 1 tablet (20 mg total) by mouth daily. Qty: 90 tablet, Refills: 1    gabapentin (NEURONTIN) 400 MG capsule Take 2 capsules (800 mg total) by mouth at bedtime. Qty: 180 capsule, Refills: 1     HYDROcodone-acetaminophen (NORCO/VICODIN) 5-325 MG per tablet Take 1 tablet by mouth every 6 (six) hours as needed for moderate pain.    isosorbide mononitrate (IMDUR) 30 MG 24 hr tablet Take 15 mg by mouth daily.    levothyroxine (SYNTHROID, LEVOTHROID) 50 MCG tablet Take 1 tablet (50 mcg total) by mouth daily. Qty: 90 tablet, Refills: 3    metoprolol succinate (TOPROL-XL) 50 MG 24 hr tablet Take 1 tablet (50 mg total) by mouth 2 (two) times daily. Take with or immediately following a meal. Qty: 60 tablet, Refills: 11    Multiple Vitamin (MULTIVITAMIN) LIQD Take 5 mLs by mouth daily.    nitroGLYCERIN (NITROSTAT) 0.4 MG SL tablet Place 0.4 mg under the tongue every 5 (five) minutes x 3 doses as needed for chest pain.    ondansetron (ZOFRAN) 4 MG tablet Take 1 tablet (4 mg total) by mouth every 12 (twelve) hours as needed for nausea. For nausea Qty: 30 tablet, Refills: 1    predniSONE (DELTASONE) 5 MG tablet Take  5 mg by mouth daily with breakfast.    sucralfate (CARAFATE) 1 GM/10ML suspension Take 1 g by mouth 2 (two) times daily.    vitamin B-12 (CYANOCOBALAMIN) 500 MCG tablet TAKE ONE TABLET BY MOUTH ONCE DAILY. Qty: 30 tablet, Refills: 11    zoster vaccine live, PF, (ZOSTAVAX) 40981 UNT/0.65ML injection Inject 19,400 Units into the skin once. Qty: 1 each, Refills: 0       Allergies  Allergen Reactions  . Sulfa Antibiotics Itching  . Tramadol Hcl Itching and Rash      The results of significant diagnostics from this hospitalization (including imaging, microbiology, ancillary and laboratory) are listed below for reference.    Significant Diagnostic Studies: Ct Abdomen Pelvis Wo Contrast  01/23/2015   CLINICAL DATA:  Severe mid abdominal pain intermittent for a few days. Epigastric pain. Nausea. Decreased bowel movements. Oral contrast only per ordering physician.  EXAM: CT ABDOMEN AND PELVIS WITHOUT CONTRAST  TECHNIQUE: Multidetector CT imaging of the abdomen and pelvis  was performed following the standard protocol without IV contrast.  COMPARISON:  CT chest abdomen and pelvis 06/03/2011  FINDINGS: Examination of solid organs and vascular structures is limited without IV contrast material. Evaluation of bowel structures is limited without oral contrast material.  Dependent atelectasis in the lung bases. Residual contrast material in the lower esophagus may indicate reflux or dysmotility.  Surgical absence of the gallbladder. The unenhanced appearance of the liver, spleen, adrenal glands, inferior vena cava, and retroperitoneal lymph nodes is unremarkable. Large exophytic lesions in both kidneys consistent with cysts as seen on previous study. No hydronephrosis in either kidney. 2 mm stone in the midportion left kidney. There is infiltration in the fat around the head of the pancreas and duodenal bulb region. Probable thickening of the duodenal wall. This could represent inflammation due to pancreatitis or duodenitis. No pancreatic ductal dilatation. Mild dilatation of the duodenum and proximal small bowel without transition zone. This is likely ileus related to the inflammatory process. No definite obstruction. No evidence of abscess or perforation. No free air in the abdomen. Given the wall thickening in the duodenum, recommend follow-up after resolution of acute process to exclude an underlying duodenum neoplasm.  Pelvis: Small umbilical hernia containing fat. The appendix is normal. Evaluation of the low pelvis is limited due to streak artifact from bilateral hip arthroplasties. No gross pelvic mass or lymphadenopathy. Visualized bladder wall is not thickened. No evidence of diverticulitis. Degenerative changes throughout the lumbar spine. No destructive bone lesions.  IMPRESSION: Inflammatory changes in the upper abdomen centered around the head of the pancreas and duodenum bulb region. This could be due to pancreatitis or inflammation from duodenitis or peptic ulcer disease.  Mild dilatation of the duodenum probably represents reactive ileus. Followup after resolution of acute process recommended to exclude underlying duodenum neoplasm.   Electronically Signed   By: Lucienne Capers M.D.   On: 01/23/2015 02:17   US Abdomen Complete  01/24/2015   CLINICAL DATA:  Abnormal liver function tests. Prior cholecystectomy.  EXAM: ULTRASOUND ABDOMEN COMPLETE  COMPARISON:  CT abdomen and pelvis 01/23/2015  FINDINGS: Gallbladder: Surgically absent.  Common bile duct: Diameter: 6 mm  Liver: No focal lesion identified. Within normal limits in parenchymal echogenicity. Slight intrahepatic biliary ductal prominence likely related to prior cholecystectomy.  IVC: No abnormality visualized.  Pancreas: Visualized portion unremarkable. Distal body and tail obscured.  Spleen: Size and appearance within normal limits.  Right Kidney: Length: 9.5 cm. Echogenicity within normal limits. No solid mass  or hydronephrosis visualized. 4.2 cm interpolar cyst.  Left Kidney: Length: 9.8 cm. 5.4 cm interpolar cyst, with poor visualization of the remainder of the renal parenchyma.  Abdominal aorta: No aneurysm visualized.  Other findings: None.  IMPRESSION: 1. No acute abnormality identified. 2. Prior cholecystectomy with minimal intrahepatic biliary ductal prominence. 3. Bilateral renal cysts. Suboptimal visualization of the left kidney.   Electronically Signed   By: Logan Bores M.D.   On: 01/24/2015 10:36   Dg Chest Port 1 View  01/23/2015   CLINICAL DATA:  Fever, mitral valve prolapse, atrial fibrillation and hypertension  EXAM: PORTABLE CHEST 1 VIEW  COMPARISON:  12/19/2012  FINDINGS: Left subclavian 2 lead pacer noted. Cardiomegaly evident with increased vascular and interstitial opacities compatible with early edema related CHF. No large effusion or pneumothorax. No focal pneumonia, collapse or consolidation. Degenerative changes of the spine and shoulders. Trachea is midline. Air within the upper thoracic  esophagus noted. This projects over the tracheal air column.  IMPRESSION: Cardiomegaly with early interstitial edema pattern.   Electronically Signed   By: Jerilynn Mages.  Shick M.D.   On: 01/23/2015 10:55   Dg Ercp  01/29/2015   CLINICAL DATA:  Duodenitis, pancreatitis  EXAM: ERCP  TECHNIQUE: Multiple spot images obtained with the fluoroscopic device and submitted for interpretation post-procedure.  FLUOROSCOPY TIME:  1 minutes 30 seconds  COMPARISON:  Ultrasound 01/24/2015 and earlier studies  FINDINGS: Series of 7 fluoroscopic spot spot images document endoscopic cannulation and opacified opacification of the CBD with multiple filling defects. The CBD is ectatic. Incomplete opacification of the intrahepatic biliary tree, appearing decompressed centrally. Subsequent images document passage of a balloon tipped catheter through the CBD.  IMPRESSION: 1. Filling defects within the common bile duct, with endoscopic intervention. These images were submitted for radiologic interpretation only. Please see the procedural report for the amount of contrast and the fluoroscopy time utilized.   Electronically Signed   By: Lucrezia Europe M.D.   On: 01/29/2015 14:57   Mr Attempted Daymon Larsen Report  01/27/2015   This examination belongs to an outside facility and is stored  here for comparison purposes only.  Contact the originating outside  institution for any associated report or interpretation.   Microbiology: Recent Results (from the past 240 hour(s))  Culture, blood (routine x 2)     Status: None   Collection Time: 01/23/15 10:21 AM  Result Value Ref Range Status   Specimen Description BLOOD RIGHT HAND  Final   Special Requests BOTTLES DRAWN AEROBIC AND ANAEROBIC 10CC  Final   Culture  Setup Time   Final    GRAM NEGATIVE RODS IN BOTH AEROBIC AND ANAEROBIC BOTTLES CRITICAL RESULT CALLED TO, READ BACK BY AND VERIFIED WITHEston Esters RN [id number] 01/23/15 A BROWNING    Culture   Final    ESCHERICHIA COLI Performed at [facility]    Report Status 01/25/2015 FINAL  Final   Organism ID, Bacteria ESCHERICHIA COLI  Final      Susceptibility   Escherichia coli - MIC*    AMPICILLIN 8 SENSITIVE Sensitive     CEFAZOLIN <=4 SENSITIVE Sensitive     CEFEPIME <=1 SENSITIVE Sensitive     CEFTAZIDIME <=1 SENSITIVE Sensitive     CEFTRIAXONE <=1 SENSITIVE Sensitive     CIPROFLOXACIN <=0.25 SENSITIVE Sensitive     GENTAMICIN <=1 SENSITIVE Sensitive     IMIPENEM <=0.25 SENSITIVE Sensitive     TRIMETH/SULFA <=20 SENSITIVE Sensitive     AMPICILLIN/SULBACTAM 4 SENSITIVE  Sensitive     PIP/TAZO <=4 SENSITIVE Sensitive     * ESCHERICHIA COLI  Culture, blood (routine x 2)     Status: None   Collection Time: 01/23/15 10:28 AM  Result Value Ref Range Status   Specimen Description BLOOD LEFT HAND  Final   Special Requests BOTTLES DRAWN AEROBIC AND ANAEROBIC 10CC  Final   Culture  Setup Time   Final    GRAM NEGATIVE RODS IN BOTH AEROBIC AND ANAEROBIC BOTTLES CRITICAL RESULT CALLED TO, READ BACK BY AND VERIFIED WITH: Richard Miu DYKE RN [id number] 01/23/15 A BROWNING    Culture   Final    ESCHERICHIA COLI SUSCEPTIBILITIES PERFORMED ON PREVIOUS CULTURE WITHIN THE LAST 5 DAYS. Performed at [facility]    Report Status 01/25/2015 FINAL  Final  Culture, Urine     Status: None   Collection Time: 01/23/15  4:06 PM  Result Value Ref Range Status   Specimen Description URINE, CLEAN CATCH  Final   Special Requests NONE  Final   Culture   Final    >=100,000 COLONIES/mL ESCHERICHIA COLI Performed at [facility]    Report Status 01/26/2015 FINAL  Final   Organism ID, Bacteria ESCHERICHIA COLI  Final      Susceptibility   Escherichia coli - MIC*    AMPICILLIN 8 SENSITIVE Sensitive     CEFAZOLIN <=4 SENSITIVE Sensitive     CEFTRIAXONE <=1 SENSITIVE Sensitive     CIPROFLOXACIN <=0.25 SENSITIVE Sensitive     GENTAMICIN <=1 SENSITIVE Sensitive     IMIPENEM <=0.25 SENSITIVE Sensitive     NITROFURANTOIN <=16 SENSITIVE  Sensitive     TRIMETH/SULFA <=20 SENSITIVE Sensitive     AMPICILLIN/SULBACTAM 4 SENSITIVE Sensitive     PIP/TAZO <=4 SENSITIVE Sensitive     * >=100,000 COLONIES/mL ESCHERICHIA COLI  Culture, blood (x 2)     Status: None (Preliminary result)   Collection Time: 01/25/15 12:37 AM  Result Value Ref Range Status   Specimen Description BLOOD RIGHT ARM  Final   Special Requests BOTTLES DRAWN AEROBIC AND ANAEROBIC 5.5CC  Final   Culture   Final    NO GROWTH 4 DAYS Performed at [facility]    Report Status PENDING  Incomplete  Culture, blood (x 2)     Status: None (Preliminary result)   Collection Time: 01/25/15 12:45 AM  Result Value Ref Range Status   Specimen Description BLOOD RIGHT ARM  Final   Special Requests BOTTLES DRAWN AEROBIC ONLY 10CC  Final   Culture   Final    NO GROWTH 4 DAYS Performed at [facility]    Report Status PENDING  Incomplete  C difficile quick scan w PCR reflex     Status: None   Collection Time: 01/28/15  5:18 PM  Result Value Ref Range Status   C Diff antigen NEGATIVE NEGATIVE Final   C Diff toxin NEGATIVE NEGATIVE Final   C Diff interpretation Negative for toxigenic C. difficile  Final     Labs: Basic Metabolic Panel:  Recent Labs Lab 01/26/15 0522 01/27/15 0438 01/27/15 0835 01/28/15 0515 01/29/15 0550 01/30/15 0600  NA 141 139  --  141 139 138  K 3.3* 3.4*  --  4.1 4.5 5.3*  CL 109 112*  --  111 109 110  CO2 24 23  --  23 22 22   GLUCOSE 90 82  --  85 86 82  BUN 22* 19  --  20 17 17   CREATININE  1.02* 0.89  --  0.87 0.82 0.94  CALCIUM 7.8* 7.7*  --  8.5* 8.6* 8.3*  MG  --   --  1.3*  --   --   --    Liver Function Tests:  Recent Labs Lab 01/26/15 0522 01/27/15 0438 01/28/15 0515 01/29/15 0550 01/30/15 0600  AST 49* 30 25 23 21   ALT 200* 147* 129* 103* 79*  ALKPHOS 149* 129* 131* 120 101  BILITOT 1.6* 1.0 1.0 1.1 0.9  PROT 5.5* 5.4* 5.9* 5.8* 5.3*  ALBUMIN 2.8* 2.7* 2.8* 2.9* 2.7*    Recent Labs Lab  01/23/15 0900 01/26/15 0522 01/28/15 0515 01/30/15 0600  LIPASE 720* 44 41 20*   No results for input(s): AMMONIA in the last 168 hours. CBC:  Recent Labs Lab 01/26/15 0522 01/27/15 0835 01/28/15 0515 01/30/15 0600  WBC 8.8 8.0 7.7 7.0  NEUTROABS  --   --  5.7  --   HGB 9.6* 10.4* 10.7* 9.6*  HCT 28.0* 31.2* 32.2* 29.7*  MCV 110.2* 111.0* 109.2* 112.1*  PLT 88* 102* 108* 137*   Cardiac Enzymes: No results for input(s): CKTOTAL, CKMB, CKMBINDEX, TROPONINI in the last 168 hours. BNP: BNP (last 3 results) No results for input(s): BNP in the last 8760 hours.  ProBNP (last 3 results) No results for input(s): PROBNP in the last 8760 hours.  CBG: No results for input(s): GLUCAP in the last 168 hours.     Signed:  Charlynne Cousins  [facility] 01/30/2015, 8:10 AM

## 2015-01-30 NOTE — Progress Notes (Signed)
     [facility] Progress Note  Subjective:   S/P ERCP with sphincterotomy and removal of stone yesterday. EGD showed gastritis. Feels much better. Tol diet.   Objective:  Vital signs in last 24 hours: Temp:  [98.1 F (36.7 C)-98.6 F (37 C)] 98.3 F (36.8 C) (09/30 0622) Pulse Rate:  [42-129] 95 (09/30 0622) Resp:  [14-25] 18 (09/30 0622) BP: (119-191)/(60-114) 119/60 mmHg (09/30 0622) SpO2:  [86 %-100 %] 100 % (09/30 0622) Last BM Date: 01/26/15 General:   Alert,  Well-developed, in NAD Heart:  irreg irreg Pulm;lungs clear Abdomen:  Soft, nontender and nondistended. Normal bowel sounds, without guarding, and without rebound.   Extremities:  Without edema. Neurologic: Alert and  oriented x4;  grossly normal neurologically. Psych: Alert and cooperative. Normal mood and affect.  Intake/Output from previous day: 09/29 0701 - 09/30 0700 In: 2125 [I.V.:2125] Out: 350 [Urine:350] Intake/Output this shift:    Lab Results:  Recent Labs  01/28/15 0515 01/30/15 0600  WBC 7.7 7.0  HGB 10.7* 9.6*  HCT 32.2* 29.7*  PLT 108* 137*   BMET  Recent Labs  01/28/15 0515 01/29/15 0550 01/30/15 0600  NA 141 139 138  K 4.1 4.5 5.3*  CL 111 109 110  CO2 23 22 22   GLUCOSE 85 86 82  BUN 20 17 17   CREATININE 0.87 0.82 0.94  CALCIUM 8.5* 8.6* 8.3*   LFT  Recent Labs  01/30/15 0600  PROT 5.3*  ALBUMIN 2.7*  AST 21  ALT 79*  ALKPHOS 101  BILITOT 0.9    Dg Ercp  01/29/2015   CLINICAL DATA:  Duodenitis, pancreatitis  EXAM: ERCP  TECHNIQUE: Multiple spot images obtained with the fluoroscopic device and submitted for interpretation post-procedure.  FLUOROSCOPY TIME:  1 minutes 30 seconds  COMPARISON:  Ultrasound 01/24/2015 and earlier studies  FINDINGS: Series of 7 fluoroscopic spot spot images document endoscopic cannulation and opacified opacification of the CBD with multiple filling defects. The CBD is ectatic. Incomplete opacification of the intrahepatic  biliary tree, appearing decompressed centrally. Subsequent images document passage of a balloon tipped catheter through the CBD.  IMPRESSION: 1. Filling defects within the common bile duct, with endoscopic intervention. These images were submitted for radiologic interpretation only. Please see the procedural report for the amount of contrast and the fluoroscopy time utilized.   Electronically Signed   By: Lucrezia Europe M.D.   On: 01/29/2015 14:57    ASSESSMENT/PLAN:   S/P EGD/ERCP--feeling well. Continue PPI. Has F/U in GI office in 1 month.     LOS: 7 days   Hvozdovic, Deloris Ping 01/30/2015, Pager [phone]  GI Attending Note  I have personally taken an interval history, reviewed the chart, and examined the patient.  I agree with the extender's note, impression and recommendations.  Sandy Salaam. Deatra Ina, MD, [facility] [phone]

## 2015-01-30 NOTE — Progress Notes (Signed)
Physical Therapy Treatment Patient Details Name: Nicole Roberts MRN: [id number] DOB: 1921-10-05 Today's Date: 01/30/2015    History of Present Illness 81 yo female admitted with acute pancreatitis. Hx of pulm HTN, RA, breast cancer, TIA, spinal stenosis    PT Comments    Pt continues to require Mod assist +2 for mobility-performed modified squat pivot from bed to recliner on today. Per pt and son, pt was transferring from bed to [facility] without assistance prior to admission. Feel pt will need ST rehab at SNF to improve strength and functional mobility prior to returning to ALF.   Follow Up Recommendations  SNF     Equipment Recommendations  None recommended by PT    Recommendations for Other Services       Precautions / Restrictions Precautions Precautions: Fall Precaution Comments: wc bound Restrictions Weight Bearing Restrictions: No    Mobility  Bed Mobility Overal bed mobility: Needs Assistance Bed Mobility: Supine to Sit     Supine to sit: Min assist;HOB elevated     General bed mobility comments: Increased time. Use of bedrail. Assist to scoot to EOB  Transfers Overall transfer level: Needs assistance Equipment used: Rolling walker (2 wheeled) Transfers: Lateral/Scoot Transfers          Lateral/Scoot Transfers: Mod assist;+2 physical assistance;+2 safety/equipment General transfer comment: Mod squat pivot/scoot transfer towards L side, bed to wheelchair. Increased time.  Ambulation/Gait             General Gait Details: pt is non amb and wheelchair level   Stairs            Wheelchair Mobility    Modified Rankin (Stroke Patients Only)       Balance Overall balance assessment: Needs assistance Sitting-balance support: Feet supported Sitting balance-Leahy Scale: Good                              Cognition Arousal/Alertness: Awake/alert Behavior During Therapy: WFL for tasks assessed/performed Overall Cognitive Status:  Within Functional Limits for tasks assessed                      Exercises General Exercises - Lower Extremity Long Arc Quad: AROM;Both;10 reps;Seated (limited ROM) Heel Slides: AROM;Both;10 reps;Supine (limited ROM)    General Comments        Pertinent Vitals/Pain Pain Assessment: No/denies pain    Home Living                      Prior Function            PT Goals (current goals can now be found in the care plan section) Progress towards PT goals: Progressing toward goals    Frequency  Min 2X/week    PT Plan Current plan remains appropriate    Co-evaluation             End of Session Equipment Utilized During Treatment: Gait belt Activity Tolerance: Patient tolerated treatment well Patient left: in chair;with call bell/phone within reach;with family/visitor present     Time: 8937-3428 PT Time Calculation (min) (ACUTE ONLY): 29 min  Charges:  $Therapeutic Activity: 23-37 mins                    G Codes:      Weston Anna, MPT Pager: [phone]

## 2015-01-30 NOTE — Clinical Social Work Placement (Signed)
   CLINICAL SOCIAL WORK PLACEMENT  NOTE  Date:  01/30/2015  Patient Details  Name: Nicole Roberts MRN: [id number] Date of Birth: 08-Dec-1921  Clinical Social Work is seeking post-discharge placement for this patient at the [place] level of care (*CSW will initial, date and re-position this form in  chart as items are completed):  Yes   Patient/family provided with [place] Work Department's list of facilities offering this level of care within the geographic area requested by the patient (or if unable, by the patient's family).  Yes   Patient/family informed of their freedom to choose among providers that offer the needed level of care, that participate in Medicare, Medicaid or managed care program needed by the patient, have an available bed and are willing to accept the patient.  Yes   Patient/family informed of [facility] [place]'s ownership interest in [facility] and [facility], as well as of the fact that they are under no obligation to receive care at these facilities.  PASRR submitted to EDS on 01/29/15     PASRR number received on 01/29/15     Existing PASRR number confirmed on       FL2 transmitted to all facilities in geographic area requested by pt/family on       FL2 transmitted to all facilities within larger geographic area on       Patient informed that his/her managed care company has contracts with or will negotiate with certain facilities, including the following:        Yes   Patient/family informed of bed offers received.  Patient chooses bed at [place] recommends and patient chooses bed at      Patient to be transferred to [facility] on  .  Patient to be transferred to facility by       Patient family notified on   of transfer.  Name of family member notified:        PHYSICIAN       Additional Comment:     _______________________________________________ Naida Sleight, LCSW 01/30/2015, 3:49 PM

## 2015-01-30 NOTE — Progress Notes (Signed)
Son has filed an appeal for review of discharge by [employer] ([employer]). Process for review has been initiated and we will be notified when a decision is made.

## 2015-01-30 NOTE — Clinical Social Work Note (Signed)
Per MD pt is medically stable for d/c today.  CSW informed pt and son of d/c plans.  Pt's son stated he does not agree with d/c as pt has not been eating.  Pt's son is currently appealing discharge.  CSW extended bed offers to pt's son.  Pt's son selected [facility] for [facility].  CSW informed [facility] and sent d/c documentation to facility.  CSW explained to facility staff that pt's son is appealing d/c.  Staff at [facility] stated pt would be able to admit to their facility any time over the weekend.  Room number is 111 and number to call report is [phone].  CSW will continue to follow and assist with d/c planning needs once the appeal process is complete.    [place], [place]

## 2015-01-31 NOTE — Progress Notes (Signed)
Heard back from Jan at [facility]. MD reviewer there agrees with DC. Liability begins an noon tomorrow, October 2. She has communicated the decision to the son.

## 2015-01-31 NOTE — Clinical Social Work Note (Signed)
CSW called [facility] for pt pick up at 6pm so that pt may have her dinner before going to SNF  .Dede Query, LCSW [facility] Clinical Social Worker - Weekend Coverage cell #: [phone]

## 2015-01-31 NOTE — Clinical Social Work Placement (Addendum)
   CLINICAL SOCIAL WORK PLACEMENT  NOTE  Date:  01/31/2015  Patient Details  Name: Nicole Roberts MRN: [id number] Date of Birth: 08-07-1921  Clinical Social Work is seeking post-discharge placement for this patient at the [place] level of care (*CSW will initial, date and re-position this form in  chart as items are completed):  Yes   Patient/family provided with [place] Work Department's list of facilities offering this level of care within the geographic area requested by the patient (or if unable, by the patient's family).  Yes   Patient/family informed of their freedom to choose among providers that offer the needed level of care, that participate in Medicare, Medicaid or managed care program needed by the patient, have an available bed and are willing to accept the patient.  Yes   Patient/family informed of [place]'s ownership interest in [facility] and [facility], as well as of the fact that they are under no obligation to receive care at these facilities.  PASRR submitted to EDS on 01/29/15     PASRR number received on 01/29/15     Existing PASRR number confirmed on       FL2 transmitted to all facilities in geographic area requested by pt/family on       FL2 transmitted to all facilities within larger geographic area on       Patient informed that his/her managed care company has contracts with or will negotiate with certain facilities, including the following:        Yes   Patient/family informed of bed offers received.  Patient chooses bed at [place] recommends and patient chooses bed at      Patient to be transferred to [facility] on  .  Patient to be transferred to facility by   ambulance    Patient family notified on  February 01, 2015 of transfer.  Name of family member notified:    Arthur/son    PHYSICIAN       Additional Comment:     _______________________________________________ Carlean Jews, LCSW 01/31/2015, 5:05 PM

## 2015-01-31 NOTE — Clinical Social Work Note (Addendum)
Per RN/case manager, her supervisor stated pt may stay this evening.  Cancelled [facility]. Pt will discharge to [facility] tomorrow at 8am  Let RN/secretary know that pt is not discharges until the morning   .Dede Query, LCSW [facility] Clinical Social Worker - Weekend Coverage cell #: [phone]

## 2015-01-31 NOTE — Progress Notes (Signed)
[facility] appeals Mammie Lorenzo, RN, [phone] called to verify that there was an accepting SNF for Ms Kasler. I advised her of the [facility] bed. She is forwarding the case to her medical advisor now. Should hear a determination from her later this afternoon.

## 2015-01-31 NOTE — Clinical Social Work Note (Signed)
CSW reviewed week day hand off reflecting pt is from [facility] and can return over the weekend   Pt has filed an appeal to her discharge.  CSW spoke with case manager provided SNF information and asked for update with appeal once received.  Dede Query, LCSW [place] Worker - Weekend Coverage cell #: [phone]

## 2015-01-31 NOTE — Progress Notes (Signed)
[facility] PROGRESS NOTE    Progress Note   Nicole Roberts ZOX:[id number] DOB: 05/28/1921 DOA: 01/22/2015 PCP: Gwendolyn Grant, MD   Brief Narrative:   Nicole Roberts is an 81 y.o. female with past medical history of essential hypertension peptic ulcer disease atrial fibrillation on apixiban and dementia who comes in complaining of epigastric pain and nausea but no vomiting. On admission she was found to have acute pancreatitis with duodenitis on CT scan. Her lipase was 1692 with an elevation of her liver enzymes including a bilirubin of 2.8 abdominal ultrasound showed no acute findings, she was also found to be bacteremic with 2 x 2 blood culture showing Escherichia coli she had been started on empiric antibiotics. ID was consulted and recommended to consider which are to amoxicillin.  Assessment/Plan:   Acute pancreatitis and duodenitis: She is on Carafate and proton and she is tolerating her diet. Her son refused and appeared to her discharge. Patient is awaiting placement.  Escherichia coli bacteremia/Sepsis: Continue amoxicillin for 8 additional days.  Elevated LFTs: Now down.  Hypothyroidism: Continue Synthroid.  Dyslipidemia: Statins were held due to elevation of LFTs.  Essential hypertension; continue current regimen seems to be stable.  Rheumatoid arthritis: Continue Synthroid.  Stage III chronic kidney disease: Currently at baseline.  Depression: Continue Celexa.  Chronic combined systolic and diastolic heart failure: She seems to be compensated.  Thrombocytopenia: Slowly trending up likely due to infectious etiology.  Chronic atrial fibrillation: Continue metoprolol, Eluquis on hold.  Severe protein caloric no much edition: Continue supplementation.  Acute encephalopathy: Likely due to infectious etiology now resolved.       DVT Prophylaxis - Lovenox ordered.  Family Communication: son Disposition Plan: Home when stable. Code  Status:     Code Status Orders        Start     Ordered   01/23/15 0313  Do not attempt resuscitation (DNR)   Continuous    Question Answer Comment  In the event of cardiac or respiratory ARREST Do not call a "code blue"   In the event of cardiac or respiratory ARREST Do not perform Intubation, CPR, defibrillation or ACLS   In the event of cardiac or respiratory ARREST Use medication by any route, position, wound care, and other measures to relive pain and suffering. May use oxygen, suction and manual treatment of airway obstruction as needed for comfort.   Comments Noted most recently in 8/2 PMR note, also noted during 2014 admit.      01/23/15 0314    Advance Directive Documentation        Most Recent Value   Type of Advance Directive  Out of facility DNR (pink MOST or yellow form)   Pre-existing out of facility DNR order (yellow form or pink MOST form)  Yellow form placed in chart (order not valid for inpatient use)   "MOST" Form in Place?          IV Access:    Peripheral IV   Procedures and diagnostic studies:   Dg Ercp  01/29/2015   CLINICAL DATA:  Duodenitis, pancreatitis  EXAM: ERCP  TECHNIQUE: Multiple spot images obtained with the fluoroscopic device and submitted for interpretation post-procedure.  FLUOROSCOPY TIME:  1 minutes 30 seconds  COMPARISON:  Ultrasound 01/24/2015 and earlier studies  FINDINGS: Series of 7 fluoroscopic spot spot images document endoscopic cannulation and opacified opacification of the CBD with multiple filling defects. The CBD is ectatic. Incomplete opacification of the intrahepatic biliary tree, appearing decompressed centrally. Subsequent  images document passage of a balloon tipped catheter through the CBD.  IMPRESSION: 1. Filling defects within the common bile duct, with endoscopic intervention. These images were submitted for radiologic interpretation only. Please see the procedural report for the amount of contrast and the fluoroscopy time  utilized.   Electronically Signed   By: Lucrezia Europe M.D.   On: 01/29/2015 14:57     Medical Consultants:    None.  Anti-Infectives:   Anti-infectives    Start     Dose/Rate Route Frequency Ordered Stop   01/30/15 0000  amoxicillin (AMOXIL) 500 MG capsule     500 mg Oral Every 8 hours 01/30/15 0806     01/29/15 1033  Ampicillin-Sulbactam (UNASYN) 3 g in sodium chloride 0.9 % 100 mL IVPB  Status:  Discontinued     3 g 100 mL/hr over 60 Minutes Intravenous 30 min pre-op 01/29/15 1020 01/29/15 1502   01/28/15 0900  amoxicillin (AMOXIL) capsule 500 mg     500 mg Oral 3 times per day 01/28/15 0811     01/26/15 2000  ciprofloxacin (CIPRO) tablet 500 mg  Status:  Discontinued     500 mg Oral 2 times daily 01/26/15 1102 01/28/15 0811   01/26/15 1400  metroNIDAZOLE (FLAGYL) tablet 500 mg  Status:  Discontinued     500 mg Oral 3 times per day 01/26/15 1102 01/28/15 0811   01/26/15 1000  cefTRIAXone (ROCEPHIN) 2 g in dextrose 5 % 50 mL IVPB  Status:  Discontinued     2 g 100 mL/hr over 30 Minutes Intravenous Every 24 hours 01/26/15 0801 01/26/15 1102   01/25/15 1930  acyclovir (ZOVIRAX) tablet 400 mg  Status:  Discontinued     400 mg Oral 5 times daily 01/25/15 1921 01/30/15 0807   01/24/15 2100  piperacillin-tazobactam (ZOSYN) IVPB 3.375 g  Status:  Discontinued     3.375 g 12.5 mL/hr over 240 Minutes Intravenous Every 8 hours 01/24/15 1012 01/26/15 0801   01/24/15 1030  piperacillin-tazobactam (ZOSYN) IVPB 3.375 g     3.375 g 100 mL/hr over 30 Minutes Intravenous STAT 01/24/15 1010 01/24/15 1333   01/23/15 2300  cefTRIAXone (ROCEPHIN) 1 g in dextrose 5 % 50 mL IVPB  Status:  Discontinued     1 g 100 mL/hr over 30 Minutes Intravenous Every 24 hours 01/23/15 2255 01/24/15 9476      Subjective:    Nicole Roberts she relates her abdomen is bothering her. Enjoying foods.  Objective:    Filed Vitals:   01/30/15 0622 01/30/15 1438 01/30/15 2200 01/31/15 0615  BP: 119/60 121/52  148/68 138/75  Pulse: 95 91 83 83  Temp: 98.3 F (36.8 C) 97.8 F (36.6 C) 98.1 F (36.7 C) 98.1 F (36.7 C)  TempSrc: Oral Oral Oral Oral  Resp: 18 20 16 16   Height:      Weight:      SpO2: 100% 96% 99% 98%    Intake/Output Summary (Last 24 hours) at 01/31/15 1048 Last data filed at 01/30/15 2200  Gross per 24 hour  Intake    240 ml  Output      0 ml  Net    240 ml   Filed Weights   01/23/15 0400 01/23/15 1840 01/25/15 0659  Weight: 67.495 kg (148 lb 12.8 oz) 73.7 kg (162 lb 7.7 oz) 74.2 kg (163 lb 9.3 oz)    Exam: Gen:  NAD Cardiovascular:  RRR,  Respiratory:  Good air movement and clear to auscultation Gastrointestinal:  Abdomen soft, with epigastric tenderness no rebound or guarding positive bowel sounds Extremities:  No edema   Data Reviewed:    Labs: Basic Metabolic Panel:  Recent Labs Lab 01/26/15 0522 01/27/15 0438 01/27/15 0835 01/28/15 0515 01/29/15 0550 01/30/15 0600  NA 141 139  --  141 139 138  K 3.3* 3.4*  --  4.1 4.5 5.3*  CL 109 112*  --  111 109 110  CO2 24 23  --  23 22 22   GLUCOSE 90 82  --  85 86 82  BUN 22* 19  --  20 17 17   CREATININE 1.02* 0.89  --  0.87 0.82 0.94  CALCIUM 7.8* 7.7*  --  8.5* 8.6* 8.3*  MG  --   --  1.3*  --   --   --    GFR Estimated Creatinine Clearance: 36.7 mL/min (by C-G formula based on Cr of 0.94). Liver Function Tests:  Recent Labs Lab 01/26/15 0522 01/27/15 0438 01/28/15 0515 01/29/15 0550 01/30/15 0600  AST 49* 30 25 23 21   ALT 200* 147* 129* 103* 79*  ALKPHOS 149* 129* 131* 120 101  BILITOT 1.6* 1.0 1.0 1.1 0.9  PROT 5.5* 5.4* 5.9* 5.8* 5.3*  ALBUMIN 2.8* 2.7* 2.8* 2.9* 2.7*    Recent Labs Lab 01/26/15 0522 01/28/15 0515 01/30/15 0600  LIPASE 44 41 20*   No results for input(s): AMMONIA in the last 168 hours. Coagulation profile No results for input(s): INR, PROTIME in the last 168 hours.  CBC:  Recent Labs Lab 01/26/15 0522 01/27/15 0835 01/28/15 0515 01/30/15 0600  WBC  8.8 8.0 7.7 7.0  NEUTROABS  --   --  5.7  --   HGB 9.6* 10.4* 10.7* 9.6*  HCT 28.0* 31.2* 32.2* 29.7*  MCV 110.2* 111.0* 109.2* 112.1*  PLT 88* 102* 108* 137*   Cardiac Enzymes: No results for input(s): CKTOTAL, CKMB, CKMBINDEX, TROPONINI in the last 168 hours. BNP (last 3 results) No results for input(s): PROBNP in the last 8760 hours. CBG: No results for input(s): GLUCAP in the last 168 hours. D-Dimer: No results for input(s): DDIMER in the last 72 hours. Hgb A1c: No results for input(s): HGBA1C in the last 72 hours. Lipid Profile: No results for input(s): CHOL, HDL, LDLCALC, TRIG, CHOLHDL, LDLDIRECT in the last 72 hours. Thyroid function studies: No results for input(s): TSH, T4TOTAL, T3FREE, THYROIDAB in the last 72 hours.  Invalid input(s): FREET3 Anemia work up: No results for input(s): VITAMINB12, FOLATE, FERRITIN, TIBC, IRON, RETICCTPCT in the last 72 hours. Sepsis Labs:  Recent Labs Lab 01/24/15 1325 01/26/15 0522 01/27/15 0835 01/28/15 0515 01/30/15 0600  WBC  --  8.8 8.0 7.7 7.0  LATICACIDVEN 1.7  --   --   --   --    Microbiology Recent Results (from the past 240 hour(s))  Culture, blood (routine x 2)     Status: None   Collection Time: 01/23/15 10:21 AM  Result Value Ref Range Status   Specimen Description BLOOD RIGHT HAND  Final   Special Requests BOTTLES DRAWN AEROBIC AND ANAEROBIC 10CC  Final   Culture  Setup Time   Final    GRAM NEGATIVE RODS IN BOTH AEROBIC AND ANAEROBIC BOTTLES CRITICAL RESULT CALLED TO, READ BACK BY AND VERIFIED WITHEston Esters RN [id number] 01/23/15 A BROWNING    Culture   Final    ESCHERICHIA COLI Performed at [facility]    Report Status 01/25/2015 FINAL  Final   Organism ID,  Bacteria ESCHERICHIA COLI  Final      Susceptibility   Escherichia coli - MIC*    AMPICILLIN 8 SENSITIVE Sensitive     CEFAZOLIN <=4 SENSITIVE Sensitive     CEFEPIME <=1 SENSITIVE Sensitive     CEFTAZIDIME <=1 SENSITIVE Sensitive      CEFTRIAXONE <=1 SENSITIVE Sensitive     CIPROFLOXACIN <=0.25 SENSITIVE Sensitive     GENTAMICIN <=1 SENSITIVE Sensitive     IMIPENEM <=0.25 SENSITIVE Sensitive     TRIMETH/SULFA <=20 SENSITIVE Sensitive     AMPICILLIN/SULBACTAM 4 SENSITIVE Sensitive     PIP/TAZO <=4 SENSITIVE Sensitive     * ESCHERICHIA COLI  Culture, blood (routine x 2)     Status: None   Collection Time: 01/23/15 10:28 AM  Result Value Ref Range Status   Specimen Description BLOOD LEFT HAND  Final   Special Requests BOTTLES DRAWN AEROBIC AND ANAEROBIC 10CC  Final   Culture  Setup Time   Final    GRAM NEGATIVE RODS IN BOTH AEROBIC AND ANAEROBIC BOTTLES CRITICAL RESULT CALLED TO, READ BACK BY AND VERIFIED WITH: Richard Miu DYKE RN [id number] 01/23/15 A BROWNING    Culture   Final    ESCHERICHIA COLI SUSCEPTIBILITIES PERFORMED ON PREVIOUS CULTURE WITHIN THE LAST 5 DAYS. Performed at [facility]    Report Status 01/25/2015 FINAL  Final  Culture, Urine     Status: None   Collection Time: 01/23/15  4:06 PM  Result Value Ref Range Status   Specimen Description URINE, CLEAN CATCH  Final   Special Requests NONE  Final   Culture   Final    >=100,000 COLONIES/mL ESCHERICHIA COLI Performed at [facility]    Report Status 01/26/2015 FINAL  Final   Organism ID, Bacteria ESCHERICHIA COLI  Final      Susceptibility   Escherichia coli - MIC*    AMPICILLIN 8 SENSITIVE Sensitive     CEFAZOLIN <=4 SENSITIVE Sensitive     CEFTRIAXONE <=1 SENSITIVE Sensitive     CIPROFLOXACIN <=0.25 SENSITIVE Sensitive     GENTAMICIN <=1 SENSITIVE Sensitive     IMIPENEM <=0.25 SENSITIVE Sensitive     NITROFURANTOIN <=16 SENSITIVE Sensitive     TRIMETH/SULFA <=20 SENSITIVE Sensitive     AMPICILLIN/SULBACTAM 4 SENSITIVE Sensitive     PIP/TAZO <=4 SENSITIVE Sensitive     * >=100,000 COLONIES/mL ESCHERICHIA COLI  Culture, blood (x 2)     Status: None   Collection Time: 01/25/15 12:37 AM  Result Value Ref Range Status   Specimen  Description BLOOD RIGHT ARM  Final   Special Requests BOTTLES DRAWN AEROBIC AND ANAEROBIC 5.5CC  Final   Culture   Final    NO GROWTH 5 DAYS Performed at [facility]    Report Status 01/30/2015 FINAL  Final  Culture, blood (x 2)     Status: None   Collection Time: 01/25/15 12:45 AM  Result Value Ref Range Status   Specimen Description BLOOD RIGHT ARM  Final   Special Requests BOTTLES DRAWN AEROBIC ONLY 10CC  Final   Culture   Final    NO GROWTH 5 DAYS Performed at [facility]    Report Status 01/30/2015 FINAL  Final  C difficile quick scan w PCR reflex     Status: None   Collection Time: 01/28/15  5:18 PM  Result Value Ref Range Status   C Diff antigen NEGATIVE NEGATIVE Final   C Diff toxin NEGATIVE NEGATIVE Final   C Diff  interpretation Negative for toxigenic C. difficile  Final     Medications:   . sodium chloride   Intravenous Once  . amoxicillin  500 mg Oral 3 times per day  . antiseptic oral rinse  7 mL Mouth Rinse q12n4p  . atorvastatin  10 mg Oral QHS  . calcium-vitamin D  1 tablet Oral BID  . cholecalciferol  1,000 Units Oral Daily  . citalopram  20 mg Oral Daily  . conjugated estrogens  1 Applicatorful Vaginal Once per day on Mon Wed Fri  . feeding supplement (ENSURE ENLIVE)  237 mL Oral BID BM  . fentaNYL  75 mcg Transdermal Q72H  . folic acid  449 mcg Oral q morning - 10a  . furosemide  20 mg Oral Daily  . gabapentin  800 mg Oral QHS  . isosorbide mononitrate  15 mg Oral Daily  . levothyroxine  50 mcg Oral Daily  . magic mouthwash w/lidocaine  10 mL Oral QID  . metoprolol succinate  50 mg Oral BID  . multivitamin  5 mL Oral Daily  . pantoprazole  40 mg Oral BID  . polyvinyl alcohol  1 drop Both Eyes TID  . predniSONE  5 mg Oral Q breakfast  . saccharomyces boulardii  250 mg Oral BID  . sucralfate  1 g Oral TID WC & HS  . vitamin B-12  500 mcg Oral Daily   Continuous Infusions:    Time spent: 25 min   LOS: 8 days   Charlynne Cousins  [facility] Pager [phone]  *Please refer to [email or web address], password [email or web address] to get updated schedule on who will round on this patient, as hospitalists switch teams weekly. If 7PM-7AM, please contact night-coverage at [email or web address], password [email or web address] for any overnight needs.  01/31/2015, 10:48 AM

## 2015-01-31 NOTE — Care Management Note (Signed)
Case Management Note  Patient Details  Name: Nicole Roberts MRN: [id number] Date of Birth: 07/17/1921  Subjective/Objective:     Acute pancreatitis and duodenitis               Action/Plan:  Planned dc to SNF.   Expected Discharge Date:  01/30/2015               Expected Discharge Plan:  [facility]  In-House Referral:  Clinical Social Work  Discharge planning Services  CM Consult      Status of Service:  Completed, signed off  Medicare Important Message Given:  Yes-third notification given Date Medicare IM Given:    Medicare IM give by:    Date Additional Medicare IM Given:    Additional Medicare Important Message give by:     If discussed at [place] of Stay Meetings, dates discussed:    Additional Comments: NCM contacted [facility] for follow up on Medicare appeal. Appeal is being reviewed by Medical Director. Case # [id number].  Erenest Rasher, RN 01/31/2015, 11:34 AM

## 2015-02-01 NOTE — Progress Notes (Signed)
Patient d/c hospital via EMS with belongings and son.  This nurse attempted to contact [facility] to give report multiple times, no answer. Virginia Rochester, RN

## 2015-02-01 NOTE — Clinical Social Work Placement (Signed)
   CLINICAL SOCIAL WORK PLACEMENT  NOTE  Date:  02/01/2015  Patient Details  Name: Nicole Roberts MRN: [id number] Date of Birth: 26-Jun-1921  Clinical Social Work is seeking post-discharge placement for this patient at the [place] level of care (*CSW will initial, date and re-position this form in  chart as items are completed):  Yes   Patient/family provided with [place] Work Department's list of facilities offering this level of care within the geographic area requested by the patient (or if unable, by the patient's family).  Yes   Patient/family informed of their freedom to choose among providers that offer the needed level of care, that participate in Medicare, Medicaid or managed care program needed by the patient, have an available bed and are willing to accept the patient.  Yes   Patient/family informed of [facility]'s ownership interest in [facility] and [facility], as well as of the fact that they are under no obligation to receive care at these facilities.  PASRR submitted to EDS on 01/29/15     PASRR number received on 01/29/15     Existing PASRR number confirmed on       FL2 transmitted to all facilities in geographic area requested by pt/family on       FL2 transmitted to all facilities within larger geographic area on       Patient informed that his/her managed care company has contracts with or will negotiate with certain facilities, including the following:        Yes   Patient/family informed of bed offers received.  Patient chooses bed at [place] recommends and patient chooses bed at      Patient to be transferred to [facility] on  .February 01, 2015  Patient to be transferred to facility by   ambulance    Patient family notified on  February 01, 2015  of transfer.  Name of family member notified:    son    PHYSICIAN       Additional Comment:     _______________________________________________ Carlean Jews, LCSW 02/01/2015, 8:51 AM

## 2015-02-02 ENCOUNTER — Non-Acute Institutional Stay (SKILLED_NURSING_FACILITY): Payer: Medicare Other | Admitting: Internal Medicine

## 2015-02-02 DIAGNOSIS — A4151 Sepsis due to Escherichia coli [E. coli]: Secondary | ICD-10-CM | POA: Diagnosis not present

## 2015-02-02 DIAGNOSIS — F329 Major depressive disorder, single episode, unspecified: Secondary | ICD-10-CM

## 2015-02-02 DIAGNOSIS — E785 Hyperlipidemia, unspecified: Secondary | ICD-10-CM | POA: Diagnosis not present

## 2015-02-02 DIAGNOSIS — F32A Depression, unspecified: Secondary | ICD-10-CM

## 2015-02-02 DIAGNOSIS — E034 Atrophy of thyroid (acquired): Secondary | ICD-10-CM

## 2015-02-02 DIAGNOSIS — M0579 Rheumatoid arthritis with rheumatoid factor of multiple sites without organ or systems involvement: Secondary | ICD-10-CM | POA: Diagnosis not present

## 2015-02-02 DIAGNOSIS — I5042 Chronic combined systolic (congestive) and diastolic (congestive) heart failure: Secondary | ICD-10-CM

## 2015-02-02 DIAGNOSIS — E038 Other specified hypothyroidism: Secondary | ICD-10-CM

## 2015-02-02 DIAGNOSIS — K851 Biliary acute pancreatitis without necrosis or infection: Secondary | ICD-10-CM | POA: Diagnosis not present

## 2015-02-02 DIAGNOSIS — K2970 Gastritis, unspecified, without bleeding: Secondary | ICD-10-CM

## 2015-02-02 DIAGNOSIS — I10 Essential (primary) hypertension: Secondary | ICD-10-CM | POA: Diagnosis not present

## 2015-02-02 DIAGNOSIS — K8050 Calculus of bile duct without cholangitis or cholecystitis without obstruction: Secondary | ICD-10-CM | POA: Diagnosis not present

## 2015-02-02 DIAGNOSIS — I482 Chronic atrial fibrillation, unspecified: Secondary | ICD-10-CM

## 2015-02-02 DIAGNOSIS — K2990 Gastroduodenitis, unspecified, without bleeding: Secondary | ICD-10-CM

## 2015-02-02 NOTE — Progress Notes (Signed)
MRN: [id number] Name: Nicole Roberts  Sex: female Age: 81 y.o. DOB: [birth year]  [place] #: Helene Kelp Facility/Room:111 Level Of Care: SNF Provider: Inocencio Homes D Emergency Contacts: Extended Emergency Contact Information Primary Emergency Contact: [place] of [place] Phone: [phone] Mobile Phone: [phone] Relation: Son Secondary Emergency Contact: Biagio Quint Address: [address]          [place], [place] [address] [place] Phone: [phone] Mobile Phone: [phone] Relation: Relative  Code Status:   Allergies: Sulfa antibiotics and Tramadol hcl  Chief Complaint  Patient presents with  . New Admit To SNF    HPI: Patient is 81 y.o. female pesented with abdominal pain for a couple of days worsening in frequency with some nausea but no vomiting. She denied any black stools or red stools. Pt was admitted to hospital from 9/22-30 where she was dx with E Coli bacteremia 2/2 to pancreatitis. Pt is admitted to SNF for generalized weakness for OT/PT.[name] While at SNF pt will be followed by a fib, tx with metoprolol and eliquis, hypothyroid, tx with synthroid, and HLD tx with lipitor.  Past Medical History  Diagnosis Date  . HEARING LOSS   . MITRAL VALVE INSUFF&AORTIC VALVE INSUFF     moderate MR  . PULMONARY HYPERTENSION   . Atrial fibrillation ([place])     permanent  . SICK SINUS SYNDROME     a. Tachybrady syndrome - [facility] PPM 10/2005.  . Edema 03/19/2009    due to venous insufficiency, chronic lymphedema  . URINARY INCONTINENCE   . Rheumatoid arthritis(714.0) dx clarified 2011    On prednisone  . BREAST CANCER 12/2006    ductal ca s/p L lumpectomy  . Diastolic dysfunction   . HYPERTENSION   . HYPERLIPIDEMIA   . GERD   . HYPOTHYROIDISM   . Hyperparathyroidism     2 lobes removed  . Venous insufficiency     chronic BLE edema  . Spinal stenosis   . Anemia     a. Macrocytic - normal B12, folate 08/2011. b. 3/6  heme positive stools 10/2011 - per PCP note, elected against colonscopy.  [name] TIA (transient ischemic attack) 05/2011  . Anxiety     Past Surgical History  Procedure Laterality Date  . Pacemaker placement  2005    SSS and syncope  . Cholecystectomy  04/06/99  . Abdominal hysterectomy  1970    Partial  . Left hip arthroscopy  1995  . Right hip arthroscopy  01/2001  . Carpal tunnel release  1998    bilateral  . Breast surgery  12/2006    Left breast lupectomy- Ductal CA  . Left parathyroidectomy  07/2008  . Esophagogastroduodenoscopy N/A 10/18/2012    Procedure: ESOPHAGOGASTRODUODENOSCOPY (EGD);  Surgeon: Ladene Artist, MD;  Location: [facility];  Service: Endoscopy;  Laterality: N/A;  . Esophagogastroduodenoscopy (egd) with propofol N/A 01/29/2015    Procedure: ESOPHAGOGASTRODUODENOSCOPY (EGD) WITH PROPOFOL;  Surgeon: Inda Castle, MD;  Location: [facility];  Service: Endoscopy;  Laterality: N/A;  . Ercp N/A 01/29/2015    Procedure: ENDOSCOPIC RETROGRADE CHOLANGIOPANCREATOGRAPHY (ERCP);  Surgeon: Inda Castle, MD;  Location: [facility];  Service: Endoscopy;  Laterality: N/A;      Medication List       This list is accurate as of: 02/02/15 11:59 PM.  Always use your most recent med list.               alum & mag hydroxide-simeth 200-200-20 MG/5ML suspension  Commonly  known as:  MAALOX/MYLANTA  Take 30 mLs by mouth every 6 (six) hours as needed for indigestion or heartburn.     amoxicillin 500 MG capsule  Commonly known as:  AMOXIL  Take 1 capsule (500 mg total) by mouth every 8 (eight) hours.     apixaban 2.5 MG Tabs tablet  Commonly known as:  ELIQUIS  Take 1 tablet (2.5 mg total) by mouth 2 (two) times daily.     artificial tears ointment  Place 1 drop into both eyes 3 (three) times daily.     atorvastatin 10 MG tablet  Commonly known as:  LIPITOR  Take 10 mg by mouth at bedtime.     calcium citrate-vitamin D 315-200 MG-UNIT tablet  Commonly known as:   CITRACAL+D  Take 1 tablet by mouth 2 (two) times daily.     cholecalciferol 1000 UNITS tablet  Commonly known as:  VITAMIN D  Take 1,000 Units by mouth daily.     citalopram 20 MG tablet  Commonly known as:  CELEXA  Take 20 mg by mouth daily.     conjugated estrogens vaginal cream  Commonly known as:  PREMARIN  Place 1 Applicatorful vaginally 3 (three) times a week.     fentaNYL 75 MCG/HR  Commonly known as:  DURAGESIC - dosed mcg/hr  Place 1 patch (75 mcg total) onto the skin every 3 (three) days.     folic acid 381 MCG tablet  Commonly known as:  FOLVITE  Take 400 mcg by mouth every morning.     furosemide 20 MG tablet  Commonly known as:  LASIX  Take 1 tablet (20 mg total) by mouth daily.     gabapentin 400 MG capsule  Commonly known as:  NEURONTIN  Take 2 capsules (800 mg total) by mouth at bedtime.     HYDROcodone-acetaminophen 5-325 MG tablet  Commonly known as:  NORCO/VICODIN  Take 1 tablet by mouth every 6 (six) hours as needed for moderate pain.     isosorbide mononitrate 30 MG 24 hr tablet  Commonly known as:  IMDUR  Take 15 mg by mouth daily.     levothyroxine 50 MCG tablet  Commonly known as:  SYNTHROID, LEVOTHROID  Take 1 tablet (50 mcg total) by mouth daily.     metoprolol succinate 50 MG 24 hr tablet  Commonly known as:  TOPROL-XL  Take 1 tablet (50 mg total) by mouth 2 (two) times daily. Take with or immediately following a meal.     multivitamin Liqd  Take 5 mLs by mouth daily.     nitroGLYCERIN 0.4 MG SL tablet  Commonly known as:  NITROSTAT  Place 0.4 mg under the tongue every 5 (five) minutes x 3 doses as needed for chest pain.     ondansetron 4 MG tablet  Commonly known as:  ZOFRAN  Take 1 tablet (4 mg total) by mouth every 12 (twelve) hours as needed for nausea. For nausea     pantoprazole 40 MG tablet  Commonly known as:  PROTONIX  Take 1 tablet (40 mg total) by mouth 2 (two) times daily.     potassium chloride 10 MEQ tablet   Commonly known as:  K-DUR  Take 1 tablet (10 mEq total) by mouth daily.     predniSONE 5 MG tablet  Commonly known as:  DELTASONE  Take 5 mg by mouth daily with breakfast.     sucralfate 1 GM/10ML suspension  Commonly known as:  CARAFATE  Take 1 g  by mouth 2 (two) times daily.     vitamin B-12 500 MCG tablet  Commonly known as:  CYANOCOBALAMIN  TAKE ONE TABLET BY MOUTH ONCE DAILY.     zoster vaccine live (PF) 19400 UNT/0.65ML injection  Commonly known as:  ZOSTAVAX  Inject 19,400 Units into the skin once.        No orders of the defined types were placed in this encounter.    Immunization History  Administered Date(s) Administered  . Influenza Whole 01/30/2009, 01/26/2010  . Influenza, High Dose Seasonal PF 02/25/2013, 02/04/2014  . Influenza-Unspecified 01/31/2012  . Pneumococcal Conjugate-13 06/02/2014  . Pneumococcal Polysaccharide-23 05/03/2007  . Td 05/02/2004  . Tdap 12/02/2014    Social History  Substance Use Topics  . Smoking status: Never Smoker   . Smokeless tobacco: Never Used  . Alcohol Use: No    Family history is + CAD, CA   Review of Systems  DATA OBTAINED: from patient, nurse GENERAL:  no fevers, fatigue, appetite changes SKIN: No itching, rash or wounds EYES: No eye pain, redness, discharge EARS: No earache, tinnitus, change in hearing NOSE: No congestion, drainage or bleeding  MOUTH/THROAT: No mouth or tooth pain, No sore throat RESPIRATORY: No cough, wheezing, SOB CARDIAC: No chest pain, palpitations, lower extremity edema  GI: No abdominal pain, No N/V/D or constipation, No heartburn or reflux  GU: No dysuria, frequency or urgency, or incontinence  MUSCULOSKELETAL: No unrelieved bone/joint pain NEUROLOGIC: No headache, dizziness or focal weakness PSYCHIATRIC: No c/o anxiety or sadness   Filed Vitals:   02/02/15 1353  BP: 121/66  Pulse: 72  Temp: 98.3 F (36.8 C)  Resp: 20    SpO2 Readings from Last 1 Encounters:  02/01/15  99%        Physical Exam  GENERAL APPEARANCE: Alert, conversant,  No acute distress.  SKIN: No diaphoresis rash HEAD: Normocephalic, atraumatic  EYES: Conjunctiva/lids clear. Pupils round, reactive. EOMs intact.  EARS: External exam WNL, canals clear. Hearing grossly normal.  NOSE: No deformity or discharge.  MOUTH/THROAT: Lips w/o lesions  RESPIRATORY: Breathing is even, unlabored. Lung sounds are clear   CARDIOVASCULAR: Heart irreg no murmurs, rubs or gallops. No peripheral edema.   GASTROINTESTINAL: Abdomen is soft, non-tender, not distended w/ normal bowel sounds. GENITOURINARY: Bladder non tender, not distended  MUSCULOSKELETAL: No abnormal joints or musculature NEUROLOGIC:  Cranial nerves 2-12 grossly intact. Moves all extremities  PSYCHIATRIC: Mood and affect appropriate to situation, no behavioral issues  Patient Active Problem List   Diagnosis Date Noted  . Pancreatitis, acute 02/05/2015  . Gastritis and gastroduodenitis 01/30/2015  . Possible Acute cholangitis 01/30/2015  . Sepsis ([place]) 01/29/2015  . Choledocholithiasis 01/29/2015  . Abnormal LFTs   . Edema leg 01/27/2015  . Diarrhea   . Duodenitis 01/26/2015  . Bacteremia due to Escherichia coli 01/25/2015  . Sepsis due to Escherichia coli (E. coli) ([place]) 01/25/2015  . Thrombocytopenia ([place]) 01/25/2015  . Protein-calorie malnutrition, moderate ([place]) 01/25/2015  . Wheelchair bound 01/25/2015  . Acute encephalopathy 01/25/2015  . Recurrent cold sores 01/25/2015  . UTI (urinary tract infection) 01/24/2015  . Acute pancreatitis 01/23/2015  . Abdominal pain, epigastric 01/23/2015  . Hypokalemia 01/23/2015  . Leukocytosis 01/23/2015  . Abnormal LFTs (liver function tests) 01/23/2015  . Neuropathy ([place]) 01/23/2015  . Chronic combined systolic and diastolic CHF (congestive heart failure) ([place]) 01/23/2015  . Depression   . Stage III chronic kidney disease 04/06/2012  . Atrial fibrillation ([place]) 02/05/2012  .  Rheumatoid arthritis ([place])  01/26/2010  . Hypothyroidism 05/21/2009  . Dyslipidemia 05/21/2009  . Essential hypertension 05/21/2009  . GERD 05/21/2009    CBC    Component Value Date/Time   WBC 7.0 01/30/2015 0600   RBC 2.65* 01/30/2015 0600   HGB 9.6* 01/30/2015 0600   HCT 29.7* 01/30/2015 0600   PLT 137* 01/30/2015 0600   MCV 112.1* 01/30/2015 0600   LYMPHSABS 1.1 01/28/2015 0515   MONOABS 0.8 01/28/2015 0515   EOSABS 0.1 01/28/2015 0515   BASOSABS 0.0 01/28/2015 0515    CMP     Component Value Date/Time   NA 138 01/30/2015 0600   K 5.3* 01/30/2015 0600   CL 110 01/30/2015 0600   CO2 22 01/30/2015 0600   GLUCOSE 82 01/30/2015 0600   BUN 17 01/30/2015 0600   CREATININE 0.94 01/30/2015 0600   CALCIUM 8.3* 01/30/2015 0600   PROT 5.3* 01/30/2015 0600   ALBUMIN 2.7* 01/30/2015 0600   AST 21 01/30/2015 0600   ALT 79* 01/30/2015 0600   ALKPHOS 101 01/30/2015 0600   BILITOT 0.9 01/30/2015 0600   GFRNONAA 52* 01/30/2015 0600   GFRAA >60 01/30/2015 0600    No results found for: HGBA1C   Mr Attempted Study-no Report  01/27/2015   This examination belongs to an outside facility and is stored  here for comparison purposes only.  Contact the originating outside  institution for any associated report or interpretation.   Not all labs, radiology exams or other studies done during hospitalization come through on my EPIC note; however they are reviewed by me.    Assessment and Plan  Pancreatitis, acute Initial CT scan of the abdomen and pelvis performed on 01/22/2015 show inflammation and dilation of the pancreas and duodenum. GI was consulted who recommended an EGD and ERCP, as the patient cannot tolerate an MRCP due to her pacemaker. Eluquis was stopped in anticipation of EGD;An ERCP was done that showed multiple filling defects in CBD - several stones were removed; She remained afebrile during this time as she was started on antibiotics on admission this was D escalated to  amoxicillin we she will continue for a week. SNF - Continue amoxil for a week   Choledocholithiasis An ERCP was done that showed multiple filling defects in CBD- several stones were removed. She remained afebrile during this time as she was started on antibiotics on admission this was D escalated to amoxicillin we she will continue for a week.   Gastritis and gastroduodenitis EGD was performed on 01/29/2015 that showed mild gastritis. She was started on Protonix by mouth twice a day SNF - Continue protonix BID  Sepsis due to Escherichia coli (E. coli) She was started empirically on antibiotics IV Zosyn IV was consulted once blood cultures came back positive for Escherichia coli, and she was transitioned to amoxicillin. Due to her high risk of developing diarrhea C. difficile PCR was checked which was negative. She was continue on Protonix and Florastor. ID recommended to continue amoxicillin for 1 additional week  SNF - cont amoxil for 7 more days  Hypothyroidism SNF - controlled; cont synthroid 50 mcg daily  Dyslipidemia LDL -34; HDL - 71; SNF - cont lipitor 10 mg  Atrial fibrillation SNF - stable ; cont metoprolol for rate and eliquis as prophylaxis  Essential hypertension SNF - controlled on metoprolol and lasix  Chronic combined systolic and diastolic CHF (congestive heart failure) SNF - stable on metoprolol and lasix  Rheumatoid arthritis SNF - controlled on prednisone 5 mg daily with fentanyl  and prn norco for pain control  Depression SNF - cont celexa 20 mg daily   Time spent 45 min;> 50% of time with patient was spent reviewing records, labs, tests and studies, counseling and developing plan of care  Hennie Duos, MD

## 2015-02-05 ENCOUNTER — Encounter: Payer: Self-pay | Admitting: Internal Medicine

## 2015-02-05 DIAGNOSIS — K851 Biliary acute pancreatitis without necrosis or infection: Secondary | ICD-10-CM | POA: Insufficient documentation

## 2015-02-05 NOTE — Assessment & Plan Note (Signed)
SNF - stable on metoprolol and lasix

## 2015-02-05 NOTE — Assessment & Plan Note (Signed)
She was started empirically on antibiotics IV Zosyn IV was consulted once blood cultures came back positive for Escherichia coli, and she was transitioned to amoxicillin. Due to her high risk of developing diarrhea C. difficile PCR was checked which was negative. She was continue on Protonix and Florastor. ID recommended to continue amoxicillin for 1 additional week  SNF - cont amoxil for 7 more days

## 2015-02-05 NOTE — Assessment & Plan Note (Signed)
SNF - stable ; cont metoprolol for rate and eliquis as prophylaxis

## 2015-02-05 NOTE — Assessment & Plan Note (Signed)
LDL -34; HDL - 71; SNF - cont lipitor 10 mg

## 2015-02-05 NOTE — Assessment & Plan Note (Signed)
SNF - controlled on prednisone 5 mg daily with fentanyl and prn norco for pain control

## 2015-02-05 NOTE — Assessment & Plan Note (Signed)
An ERCP was done that showed multiple filling defects in CBD- several stones were removed. She remained afebrile during this time as she was started on antibiotics on admission this was D escalated to amoxicillin we she will continue for a week.

## 2015-02-05 NOTE — Assessment & Plan Note (Signed)
SNF - controlled on metoprolol and lasix

## 2015-02-05 NOTE — Assessment & Plan Note (Signed)
Initial CT scan of the abdomen and pelvis performed on 01/22/2015 show inflammation and dilation of the pancreas and duodenum. GI was consulted who recommended an EGD and ERCP, as the patient cannot tolerate an MRCP due to her pacemaker. Eluquis was stopped in anticipation of EGD;An ERCP was done that showed multiple filling defects in CBD - several stones were removed; She remained afebrile during this time as she was started on antibiotics on admission this was D escalated to amoxicillin we she will continue for a week. SNF - Continue amoxil for a week

## 2015-02-05 NOTE — Assessment & Plan Note (Signed)
SNF - cont celexa 20 mg daily

## 2015-02-05 NOTE — Assessment & Plan Note (Signed)
EGD was performed on 01/29/2015 that showed mild gastritis. She was started on Protonix by mouth twice a day SNF - Continue protonix BID

## 2015-02-05 NOTE — Assessment & Plan Note (Signed)
SNF - controlled; cont synthroid 50 mcg daily

## 2015-02-06 ENCOUNTER — Non-Acute Institutional Stay (SKILLED_NURSING_FACILITY): Payer: Medicare Other | Admitting: Nurse Practitioner

## 2015-02-06 DIAGNOSIS — I482 Chronic atrial fibrillation, unspecified: Secondary | ICD-10-CM

## 2015-02-06 DIAGNOSIS — A4151 Sepsis due to Escherichia coli [E. coli]: Secondary | ICD-10-CM

## 2015-02-06 DIAGNOSIS — E785 Hyperlipidemia, unspecified: Secondary | ICD-10-CM

## 2015-02-06 DIAGNOSIS — I10 Essential (primary) hypertension: Secondary | ICD-10-CM | POA: Diagnosis not present

## 2015-02-06 DIAGNOSIS — E034 Atrophy of thyroid (acquired): Secondary | ICD-10-CM | POA: Diagnosis not present

## 2015-02-06 DIAGNOSIS — K851 Biliary acute pancreatitis without necrosis or infection: Secondary | ICD-10-CM

## 2015-02-06 DIAGNOSIS — I5042 Chronic combined systolic (congestive) and diastolic (congestive) heart failure: Secondary | ICD-10-CM

## 2015-02-06 DIAGNOSIS — E038 Other specified hypothyroidism: Secondary | ICD-10-CM

## 2015-02-06 NOTE — Progress Notes (Signed)
Patient ID: Nicole Roberts, female   DOB: [birth year], 81 y.o.   MRN: [id number]    Nursing Home Location:  [place] of Service: SNF (71)  PCP: Gwendolyn Grant, MD  Allergies  Allergen Reactions  . Sulfa Antibiotics Itching  . Tramadol Hcl Itching and Rash    Chief Complaint  Patient presents with  . Discharge Note    HPI:  Patient is a 81 y.o. female seen today at [facility] for discharge to Assisted living. Pt with a pmh of a fib, RA, CHF, hypothyroidism, anemia, venous insufficiency. Pt was admitted to hospital from 9/22-9/30 due to E Coli bacteremia 2/2 to pancreatitis. An ERCP was done that showed multiple filling defects in CBD- several stones were removed.  Pt was treated with IV Zosyn IV was consulted once blood cultures came back positive for Escherichia coli, and she was transitioned to amoxicillin and completed treatment in SNF.  Pt is admitted to SNF for generalized weakness for OT/PT. Patient currently doing well with therapy, now stable to discharge home with home health.  Review of Systems:  Review of Systems  Constitutional: Negative for activity change, appetite change, fatigue and unexpected weight change.  HENT: Negative for congestion and hearing loss.   Eyes: Negative.   Respiratory: Negative for cough and shortness of breath.   Cardiovascular: Negative for chest pain, palpitations and leg swelling.  Gastrointestinal: Negative for abdominal pain, diarrhea and constipation.  Genitourinary: Negative for dysuria and difficulty urinating.  Musculoskeletal: Negative for myalgias and arthralgias.  Skin: Negative for color change and wound.  Neurological: Negative for dizziness and weakness.  Psychiatric/Behavioral: Negative for behavioral problems, confusion and agitation.    Past Medical History  Diagnosis Date  . HEARING LOSS   . MITRAL VALVE INSUFF&AORTIC VALVE INSUFF     moderate MR  . PULMONARY HYPERTENSION   .  Atrial fibrillation ([place])     permanent  . SICK SINUS SYNDROME     a. Tachybrady syndrome - Guidant PPM 10/2005.  . Edema 03/19/2009    due to venous insufficiency, chronic lymphedema  . URINARY INCONTINENCE   . Rheumatoid arthritis(714.0) dx clarified 2011    On prednisone  . BREAST CANCER 12/2006    ductal ca s/p L lumpectomy  . Diastolic dysfunction   . HYPERTENSION   . HYPERLIPIDEMIA   . GERD   . HYPOTHYROIDISM   . Hyperparathyroidism     2 lobes removed  . Venous insufficiency     chronic BLE edema  . Spinal stenosis   . Anemia     a. Macrocytic - normal B12, folate 08/2011. b. 3/6 heme positive stools 10/2011 - per PCP note, elected against colonscopy.  [name] TIA (transient ischemic attack) 05/2011  . Anxiety    Past Surgical History  Procedure Laterality Date  . Pacemaker placement  2005    SSS and syncope  . Cholecystectomy  04/06/99  . Abdominal hysterectomy  1970    Partial  . Left hip arthroscopy  1995  . Right hip arthroscopy  01/2001  . Carpal tunnel release  1998    bilateral  . Breast surgery  12/2006    Left breast lupectomy- Ductal CA  . Left parathyroidectomy  07/2008  . Esophagogastroduodenoscopy N/A 10/18/2012    Procedure: ESOPHAGOGASTRODUODENOSCOPY (EGD);  Surgeon: Ladene Artist, MD;  Location: [facility];  Service: Endoscopy;  Laterality: N/A;  . Esophagogastroduodenoscopy (egd) with propofol N/A 01/29/2015    Procedure: ESOPHAGOGASTRODUODENOSCOPY (EGD)  WITH PROPOFOL;  Surgeon: Inda Castle, MD;  Location: [facility];  Service: Endoscopy;  Laterality: N/A;  . Ercp N/A 01/29/2015    Procedure: ENDOSCOPIC RETROGRADE CHOLANGIOPANCREATOGRAPHY (ERCP);  Surgeon: Inda Castle, MD;  Location: [facility];  Service: Endoscopy;  Laterality: N/A;   Social History:   reports that she has never smoked. She has never used smokeless tobacco. She reports that she does not drink alcohol or use illicit drugs.  Family History  Problem Relation Age of Onset  .  Ovarian cancer Mother   . Coronary artery disease Sister   . Coronary artery disease Brother   . Hypertension Mother     grandparent  . Lung cancer Father     Medications: Patient's Medications  New Prescriptions   No medications on file  Previous Medications   ALUM & MAG HYDROXIDE-SIMETH (MAALOX/MYLANTA) 200-200-20 MG/5ML SUSPENSION    Take 30 mLs by mouth every 6 (six) hours as needed for indigestion or heartburn.   APIXABAN (ELIQUIS) 2.5 MG TABS TABLET    Take 1 tablet (2.5 mg total) by mouth 2 (two) times daily.   ARTIFICIAL TEAR OINTMENT (ARTIFICIAL TEARS) OINTMENT    Place 1 drop into both eyes 3 (three) times daily.   ATORVASTATIN (LIPITOR) 10 MG TABLET    Take 10 mg by mouth at bedtime.   CALCIUM CITRATE-VITAMIN D (CITRACAL+D) 315-200 MG-UNIT PER TABLET    Take 1 tablet by mouth 2 (two) times daily.   CHOLECALCIFEROL (VITAMIN D) 1000 UNITS TABLET    Take 1,000 Units by mouth daily.   CITALOPRAM (CELEXA) 20 MG TABLET    Take 20 mg by mouth daily.   CONJUGATED ESTROGENS (PREMARIN) VAGINAL CREAM    Place 1 Applicatorful vaginally 3 (three) times a week.   FENTANYL (DURAGESIC - DOSED MCG/HR) 75 MCG/HR    Place 1 patch (75 mcg total) onto the skin every 3 (three) days.   FOLIC ACID (FOLVITE) 672 MCG TABLET    Take 400 mcg by mouth every morning.   FUROSEMIDE (LASIX) 20 MG TABLET    Take 1 tablet (20 mg total) by mouth daily.   GABAPENTIN (NEURONTIN) 400 MG CAPSULE    Take 2 capsules (800 mg total) by mouth at bedtime.   HYDROCODONE-ACETAMINOPHEN (NORCO/VICODIN) 5-325 MG TABLET    Take 1 tablet by mouth every 6 (six) hours as needed for moderate pain.   ISOSORBIDE MONONITRATE (IMDUR) 30 MG 24 HR TABLET    Take 15 mg by mouth daily.   LEVOTHYROXINE (SYNTHROID, LEVOTHROID) 50 MCG TABLET    Take 1 tablet (50 mcg total) by mouth daily.   METOPROLOL SUCCINATE (TOPROL-XL) 50 MG 24 HR TABLET    Take 1 tablet (50 mg total) by mouth 2 (two) times daily. Take with or immediately following a meal.    MULTIPLE VITAMIN (MULTIVITAMIN) LIQD    Take 5 mLs by mouth daily.   NITROGLYCERIN (NITROSTAT) 0.4 MG SL TABLET    Place 0.4 mg under the tongue every 5 (five) minutes x 3 doses as needed for chest pain.   ONDANSETRON (ZOFRAN) 4 MG TABLET    Take 1 tablet (4 mg total) by mouth every 12 (twelve) hours as needed for nausea. For nausea   PANTOPRAZOLE (PROTONIX) 40 MG TABLET    Take 1 tablet (40 mg total) by mouth 2 (two) times daily.   POTASSIUM CHLORIDE (K-DUR) 10 MEQ TABLET    Take 1 tablet (10 mEq total) by mouth daily.   PREDNISONE (DELTASONE) 5  MG TABLET    Take 5 mg by mouth daily with breakfast.   SUCRALFATE (CARAFATE) 1 GM/10ML SUSPENSION    Take 1 g by mouth 2 (two) times daily.   VITAMIN B-12 (CYANOCOBALAMIN) 500 MCG TABLET    TAKE ONE TABLET BY MOUTH ONCE DAILY.  Modified Medications   No medications on file  Discontinued Medications   AMOXICILLIN (AMOXIL) 500 MG CAPSULE    Take 1 capsule (500 mg total) by mouth every 8 (eight) hours.   ZOSTER VACCINE LIVE, PF, (ZOSTAVAX) 91638 UNT/0.65ML INJECTION    Inject 19,400 Units into the skin once.     Physical Exam: Filed Vitals:   02/06/15 1035  BP: 122/74  Pulse: 86  Temp: 97.6 F (36.4 C)  Resp: 20    Physical Exam  Constitutional: She is oriented to person, place, and time. She appears well-developed and well-nourished. No distress.  HENT:  Head: Normocephalic and atraumatic.  Mouth/Throat: Oropharynx is clear and moist. No oropharyngeal exudate.  Eyes: Conjunctivae are normal. Pupils are equal, round, and reactive to light.  Neck: Normal range of motion. Neck supple.  Cardiovascular: Normal rate, regular rhythm and normal heart sounds.   Pulmonary/Chest: Effort normal and breath sounds normal.  Abdominal: Soft. Bowel sounds are normal.  Musculoskeletal: She exhibits no edema or tenderness.  Neurological: She is alert and oriented to person, place, and time.  Skin: Skin is warm and dry. She is not diaphoretic.    Psychiatric: She has a normal mood and affect.    Labs reviewed: Basic Metabolic Panel:  Recent Labs  01/27/15 0835 01/28/15 0515 01/29/15 0550 01/30/15 0600  NA  --  141 139 138  K  --  4.1 4.5 5.3*  CL  --  111 109 110  CO2  --  23 22 22   GLUCOSE  --  85 86 82  BUN  --  20 17 17   CREATININE  --  0.87 0.82 0.94  CALCIUM  --  8.5* 8.6* 8.3*  MG 1.3*  --   --   --    Liver Function Tests:  Recent Labs  01/28/15 0515 01/29/15 0550 01/30/15 0600  AST 25 23 21   ALT 129* 103* 79*  ALKPHOS 131* 120 101  BILITOT 1.0 1.1 0.9  PROT 5.9* 5.8* 5.3*  ALBUMIN 2.8* 2.9* 2.7*    Recent Labs  01/26/15 0522 01/28/15 0515 01/30/15 0600  LIPASE 44 41 20*   No results for input(s): AMMONIA in the last 8760 hours. CBC:  Recent Labs  10/15/14 1606 01/23/15 0042  01/27/15 0835 01/28/15 0515 01/30/15 0600  WBC 7.9 14.4*  < > 8.0 7.7 7.0  NEUTROABS 5.6 13.5*  --   --  5.7  --   HGB 10.9* 12.3  < > 10.4* 10.7* 9.6*  HCT 32.7* 37.8  < > 31.2* 32.2* 29.7*  MCV 111.1 Repeated and verified X2.* 112.5*  < > 111.0* 109.2* 112.1*  PLT 141.0 Repeated and verified X2.* 144*  < > 102* 108* 137*  < > = values in this interval not displayed. TSH:  Recent Labs  06/02/14 1153  TSH 4.77*   A1C: No results found for: HGBA1C Lipid Panel:  Recent Labs  06/02/14 1153  CHOL 139  HDL 70.50  LDLCALC 34  TRIG 171.0*  CHOLHDL 2    Assessment/Plan 1. Acute gallstone pancreatitis An ERCP was done that showed multiple filling defects in CBD and  several stones were removed. Pt was treated with IV zoysn and  then changed to amoxicillin, remains stable without abdominal pain or fever  2. Sepsis due to Escherichia coli (E. coli) (HCC) Resolved. empirically started IV Zosyn and then transitioned to amoxicillin which she has completed.  She was continue on Protonix and Florastor.  3. Hypothyroidism due to acquired atrophy of thyroid cont synthroid 50 mcg daily  4.  Dyslipidemia Cont on lipitor 10 mg daily   5. Chronic atrial fibrillation (HCC) Rate controlled on metoprolol and cont on eliquis   6. Chronic combined systolic and diastolic CHF (congestive heart failure) (HCC) Stable, Euvolemic, conts on metoprolol and lasix   7. Essential hypertension -controlled on metoprolol and lasix  pt is stable for discharge to AL-will need PT/OT per home health. No DME needed. Rx written.  will need to follow up with PCP within 2 weeks.    Carlos American. Harle Battiest  [facility] [phone] 8 am - 5 pm) [phone] (after hours)

## 2015-02-09 ENCOUNTER — Ambulatory Visit: Payer: Medicare Other | Admitting: Podiatry

## 2015-02-13 ENCOUNTER — Telehealth: Payer: Self-pay | Admitting: *Deleted

## 2015-02-13 DIAGNOSIS — M069 Rheumatoid arthritis, unspecified: Secondary | ICD-10-CM

## 2015-02-13 DIAGNOSIS — Z993 Dependence on wheelchair: Secondary | ICD-10-CM

## 2015-02-13 NOTE — Telephone Encounter (Signed)
OK 

## 2015-02-13 NOTE — Telephone Encounter (Signed)
Left msg on triage stating mom has been in hospital and needing to get some physical therapy for her. Having trouble transferring to tub, toilet. Is this OK MD out of office pls advise...Johny Chess

## 2015-02-16 NOTE — Telephone Encounter (Signed)
Notified son dr. Linna Darner ok referral. Mother lives at [facility] he is wanting referral sent there...Nicole Roberts

## 2015-02-20 NOTE — Telephone Encounter (Signed)
Pt son called in and wants to know if referral has been sent over?  Lorre Nick do you know if referral has been sent.

## 2015-02-23 NOTE — Telephone Encounter (Signed)
Printed referral fax over to [facility] @ [phone]...Nicole Roberts

## 2015-02-26 ENCOUNTER — Ambulatory Visit: Payer: Medicare Other | Admitting: Physician Assistant

## 2015-02-27 ENCOUNTER — Ambulatory Visit: Payer: Medicare Other | Admitting: Physician Assistant

## 2015-03-06 ENCOUNTER — Telehealth: Payer: Self-pay | Admitting: Internal Medicine

## 2015-03-06 ENCOUNTER — Ambulatory Visit: Payer: Medicare Other | Admitting: Podiatry

## 2015-03-06 NOTE — Telephone Encounter (Signed)
Returned call to [employer] at [phone], Lm on cell vm

## 2015-03-06 NOTE — Telephone Encounter (Signed)
Nicole Roberts, called from [facility] care, stated that Nicole Roberts (as of today 03/06/15) did not get the referral that we fax on 02/23/15. Please help, they need this order.

## 2015-03-09 ENCOUNTER — Telehealth: Payer: Self-pay | Admitting: Internal Medicine

## 2015-03-09 NOTE — Telephone Encounter (Signed)
Patient's daughter is requesting an order for PT be placed. There is a specific place that is being requested, but she could not remember the name off the top of her head. We are going to update once she gets that information.

## 2015-03-09 NOTE — Telephone Encounter (Signed)
Called Lattie Haw back no answer LMOM will refax referral to her fax @ [phone]...Johny Chess

## 2015-03-10 ENCOUNTER — Ambulatory Visit: Payer: Medicare Other | Admitting: Podiatry

## 2015-03-13 ENCOUNTER — Telehealth: Payer: Self-pay | Admitting: *Deleted

## 2015-03-13 NOTE — Telephone Encounter (Signed)
Per Dr. Sharlet Salina request need to be refill by Dr. Asa Lente...Johny Chess

## 2015-03-13 NOTE — Telephone Encounter (Signed)
Left msg on triage stating pt is needing refill on her fentanyl patches...Nicole Roberts

## 2015-03-16 ENCOUNTER — Other Ambulatory Visit: Payer: Medicare Other

## 2015-03-16 ENCOUNTER — Encounter: Payer: Self-pay | Admitting: Podiatry

## 2015-03-16 ENCOUNTER — Ambulatory Visit (INDEPENDENT_AMBULATORY_CARE_PROVIDER_SITE_OTHER): Payer: Medicare Other | Admitting: Podiatry

## 2015-03-16 ENCOUNTER — Ambulatory Visit: Payer: Medicare Other

## 2015-03-16 ENCOUNTER — Telehealth: Payer: Self-pay | Admitting: Internal Medicine

## 2015-03-16 DIAGNOSIS — R300 Dysuria: Secondary | ICD-10-CM

## 2015-03-16 DIAGNOSIS — B351 Tinea unguium: Secondary | ICD-10-CM

## 2015-03-16 DIAGNOSIS — R52 Pain, unspecified: Secondary | ICD-10-CM

## 2015-03-16 DIAGNOSIS — M7989 Other specified soft tissue disorders: Secondary | ICD-10-CM | POA: Diagnosis not present

## 2015-03-16 DIAGNOSIS — S92912D Unspecified fracture of left toe(s), subsequent encounter for fracture with routine healing: Secondary | ICD-10-CM | POA: Diagnosis not present

## 2015-03-16 DIAGNOSIS — M79673 Pain in unspecified foot: Secondary | ICD-10-CM | POA: Diagnosis not present

## 2015-03-16 DIAGNOSIS — M79676 Pain in unspecified toe(s): Secondary | ICD-10-CM

## 2015-03-16 MED ORDER — FENTANYL 75 MCG/HR TD PT72: 75.0000 ug | MEDICATED_PATCH | TRANSDERMAL | Status: DC

## 2015-03-16 NOTE — Patient Instructions (Signed)
I have ordered an ultrasound of your legs.

## 2015-03-16 NOTE — Telephone Encounter (Signed)
Please advise. Specimen would need to be sterile for lab purposes.

## 2015-03-16 NOTE — Telephone Encounter (Signed)
Ok to give sample - let them know it needs to be sterile

## 2015-03-16 NOTE — Telephone Encounter (Signed)
States he believes patient has UTI.  Would like to know if he could bring urine sample to the lab today for testing.  Son states he has sample already and he is brining sample by at 12:30.

## 2015-03-16 NOTE — Telephone Encounter (Signed)
Printed for PCP to sign 

## 2015-03-17 LAB — URINE CULTURE

## 2015-03-17 NOTE — Progress Notes (Signed)
Patient ID: Nicole Roberts, female   DOB: Jun 23, 1921, 81 y.o.   MRN: [id number]  Subjective: 81 y.o. returns the office today for painful, elongated, thickened toenails which she is unable to trim herself. Denies any redness or drainage around the nails. Last appointment she also had a broken left fifth toe. She said no longer hurts. She also has been swelling to both of her legs and she has an appointment to see her primary care physician next few days for this as well. She denies any redness or legs or pain. She is currently on for a lasix. Denies any acute changes since last appointment and no new complaints today. Denies any systemic complaints such as fevers, chills, nausea, vomiting.   Objective: AAO 3, NAD DP/PT pulses palpable 1/4, CRT less than 3 seconds Protective sensation decrased with Simms Weinstein monofilament Nails hypertrophic, dystrophic, elongated, brittle, discolored 10. There is tenderness overlying the nails 1-5 bilaterally. There is no surrounding erythema or drainage along the nail sites. No open lesions or pre-ulcerative lesions are identified. There is no tenderness to palpation over the left fifth toe. There is mild edema overlying this area. No other areas of tenderness bilateral lower extremities. No overlying edema, erythema, increased warmth. There is apparently increased swelling to both her legs with mild pain. There is no erythema increase in warmth.  Assessment: Patient presents with symptomatic onychomycosis; left 5th toe fracture; leg swelling  Plan: -Treatment options including alternatives, risks, complications were discussed -Nails sharply debrided 10 without complication/bleeding. -X-rays were obtained and reviewed with the patient. There is increased consolidation across the fracture. Symptoms be resolving. Continue to monitor. -Will order venous duplex to rule out DVT. Follow-up with PCP -Discussed daily foot inspection. If there are any changes,  to call the office immediately.  -Follow-up in 3 months or sooner if any problems are to arise. In the meantime, encouraged to call the office with any questions, concerns, changes symptoms.  Celesta Gentile, DPM

## 2015-03-18 ENCOUNTER — Other Ambulatory Visit (INDEPENDENT_AMBULATORY_CARE_PROVIDER_SITE_OTHER): Payer: Medicare Other

## 2015-03-18 ENCOUNTER — Encounter: Payer: Self-pay | Admitting: Internal Medicine

## 2015-03-18 ENCOUNTER — Ambulatory Visit (INDEPENDENT_AMBULATORY_CARE_PROVIDER_SITE_OTHER): Payer: Medicare Other | Admitting: Internal Medicine

## 2015-03-18 VITALS — BP 160/82 | HR 79 | Temp 98.2°F | Resp 16 | Wt 154.0 lb

## 2015-03-18 DIAGNOSIS — E039 Hypothyroidism, unspecified: Secondary | ICD-10-CM

## 2015-03-18 DIAGNOSIS — R600 Localized edema: Secondary | ICD-10-CM

## 2015-03-18 DIAGNOSIS — I10 Essential (primary) hypertension: Secondary | ICD-10-CM | POA: Diagnosis not present

## 2015-03-18 DIAGNOSIS — R300 Dysuria: Secondary | ICD-10-CM

## 2015-03-18 LAB — CBC WITH DIFFERENTIAL/PLATELET
BASOS ABS: 0.6 10*3/uL — AB (ref 0.0–0.1)
BASOS PCT: 6.4 % — AB (ref 0.0–3.0)
EOS ABS: 0.1 10*3/uL (ref 0.0–0.7)
Eosinophils Relative: 0.8 % (ref 0.0–5.0)
HCT: 37.5 % (ref 36.0–46.0)
Hemoglobin: 11.9 g/dL — ABNORMAL LOW (ref 12.0–15.0)
LYMPHS PCT: 13.7 % (ref 12.0–46.0)
Lymphs Abs: 1.4 10*3/uL (ref 0.7–4.0)
MCHC: 31.7 g/dL (ref 30.0–36.0)
MCV: 112 fl — ABNORMAL HIGH (ref 78.0–100.0)
MONO ABS: 0.7 10*3/uL (ref 0.1–1.0)
Monocytes Relative: 6.9 % (ref 3.0–12.0)
NEUTROS ABS: 7.3 10*3/uL (ref 1.4–7.7)
NEUTROS PCT: 72.2 % (ref 43.0–77.0)
PLATELETS: 145 10*3/uL — AB (ref 150.0–400.0)
RBC: 3.33 Mil/uL — ABNORMAL LOW (ref 3.87–5.11)
RDW: 18.6 % — AB (ref 11.5–15.5)
WBC: 10.1 10*3/uL (ref 4.0–10.5)

## 2015-03-18 LAB — BRAIN NATRIURETIC PEPTIDE: PRO B NATRI PEPTIDE: 288 pg/mL — AB (ref 0.0–100.0)

## 2015-03-18 LAB — URINALYSIS
BILIRUBIN URINE: NEGATIVE
HGB URINE DIPSTICK: NEGATIVE
KETONES UR: NEGATIVE
LEUKOCYTES UA: NEGATIVE
NITRITE: NEGATIVE
Specific Gravity, Urine: 1.01 (ref 1.000–1.030)
Total Protein, Urine: NEGATIVE
URINE GLUCOSE: NEGATIVE
UROBILINOGEN UA: 0.2 (ref 0.0–1.0)
pH: 6 (ref 5.0–8.0)

## 2015-03-18 LAB — TSH: TSH: 1.39 u[IU]/mL (ref 0.35–4.50)

## 2015-03-18 MED ORDER — FUROSEMIDE 20 MG PO TABS: 40.0000 mg | ORAL_TABLET | Freq: Every day | ORAL | Status: DC

## 2015-03-18 NOTE — Progress Notes (Signed)
Pre visit review using our clinic review tool, if applicable. No additional management support is needed unless otherwise documented below in the visit note. 

## 2015-03-18 NOTE — Progress Notes (Signed)
Subjective:    Patient ID: Nicole Roberts, female    DOB: 1922/01/23, 81 y.o.   MRN: [id number]  HPI  She is here with her daughter-in-law. She has been experiencing increased leg swelling.   Leg edema:  She has chronic leg edema, but it got worse recnetly.  She takes lasix 20 mg daily.  It was better controlled up until 6 weeks ago when she had pancresatitis and went to reab for one week.  She lives in assisted living facility. She was helped for a short period of time and her husband became very ill and recently passed away.  She denies changes in her diet/salt consumption.  She receives her medication from the nurse at the assisted living facility and states she is taking the daily and as prescribed. She is confined to wheelchair, but that is not new. She denies any changes in activity.  She denies shortness of breath, chest pain, palpitations, fevers, chills and cold symptoms.  She is unsure why she has been experiencing increased leg edema. She has never had edema of the severe. She does state some intermittent lightheadedness and pain with urination.   Her blood pressure is typically well controlled, but is slightly elevated here today.  Medications and allergies reviewed with patient and updated if appropriate.  Patient Active Problem List   Diagnosis Date Noted  . Acute gallstone pancreatitis 02/05/2015  . Gastritis and gastroduodenitis 01/30/2015  . Possible Acute cholangitis 01/30/2015  . Sepsis ([place]) 01/29/2015  . Choledocholithiasis 01/29/2015  . Abnormal LFTs   . Edema leg 01/27/2015  . Diarrhea   . Duodenitis 01/26/2015  . Bacteremia due to Escherichia coli 01/25/2015  . Sepsis due to Escherichia coli (E. coli) ([place]) 01/25/2015  . Thrombocytopenia ([place]) 01/25/2015  . Protein-calorie malnutrition, moderate ([place]) 01/25/2015  . Wheelchair bound 01/25/2015  . Acute encephalopathy 01/25/2015  . Recurrent cold sores 01/25/2015  . UTI (urinary tract infection) 01/24/2015   . Acute pancreatitis 01/23/2015  . Abdominal pain, epigastric 01/23/2015  . Hypokalemia 01/23/2015  . Leukocytosis 01/23/2015  . Abnormal LFTs (liver function tests) 01/23/2015  . Neuropathy ([place]) 01/23/2015  . Chronic combined systolic and diastolic CHF (congestive heart failure) ([place]) 01/23/2015  . Depression   . Stage III chronic kidney disease 04/06/2012  . Atrial fibrillation ([place]) 02/05/2012  . Rheumatoid arthritis ([place]) 01/26/2010  . Hypothyroidism 05/21/2009  . Dyslipidemia 05/21/2009  . Essential hypertension 05/21/2009  . GERD 05/21/2009    Current Outpatient Prescriptions on File Prior to Visit  Medication Sig Dispense Refill  . alum & mag hydroxide-simeth (MAALOX/MYLANTA) 200-200-20 MG/5ML suspension Take 30 mLs by mouth every 6 (six) hours as needed for indigestion or heartburn.    [name] apixaban (ELIQUIS) 2.5 MG TABS tablet Take 1 tablet (2.5 mg total) by mouth 2 (two) times daily. 60 tablet 11  . Artificial Tear Ointment (ARTIFICIAL TEARS) ointment Place 1 drop into both eyes 3 (three) times daily.    [name] atorvastatin (LIPITOR) 10 MG tablet Take 10 mg by mouth at bedtime.    . calcium citrate-vitamin D (CITRACAL+D) 315-200 MG-UNIT per tablet Take 1 tablet by mouth 2 (two) times daily.    . cholecalciferol (VITAMIN D) 1000 UNITS tablet Take 1,000 Units by mouth daily.    . citalopram (CELEXA) 20 MG tablet Take 20 mg by mouth daily.    [name] conjugated estrogens (PREMARIN) vaginal cream Place 1 Applicatorful vaginally 3 (three) times a week.    . fentaNYL (DURAGESIC - DOSED MCG/HR) 75  MCG/HR Place 1 patch (75 mcg total) onto the skin every 3 (three) days. 10 patch 0  . folic acid (FOLVITE) A999333 MCG tablet Take 400 mcg by mouth every morning.    . furosemide (LASIX) 20 MG tablet Take 1 tablet (20 mg total) by mouth daily. 90 tablet 1  . gabapentin (NEURONTIN) 400 MG capsule Take 2 capsules (800 mg total) by mouth at bedtime. 180 capsule 1  . HYDROcodone-acetaminophen (NORCO/VICODIN)  5-325 MG tablet Take 1 tablet by mouth every 6 (six) hours as needed for moderate pain. 30 tablet 0  . isosorbide mononitrate (IMDUR) 30 MG 24 hr tablet Take 15 mg by mouth daily.    [name] levothyroxine (SYNTHROID, LEVOTHROID) 50 MCG tablet Take 1 tablet (50 mcg total) by mouth daily. 90 tablet 3  . metoprolol succinate (TOPROL-XL) 50 MG 24 hr tablet Take 1 tablet (50 mg total) by mouth 2 (two) times daily. Take with or immediately following a meal. 60 tablet 11  . Multiple Vitamin (MULTIVITAMIN) LIQD Take 5 mLs by mouth daily.    . nitroGLYCERIN (NITROSTAT) 0.4 MG SL tablet Place 0.4 mg under the tongue every 5 (five) minutes x 3 doses as needed for chest pain.    [name] ondansetron (ZOFRAN) 4 MG tablet Take 1 tablet (4 mg total) by mouth every 12 (twelve) hours as needed for nausea. For nausea 30 tablet 1  . pantoprazole (PROTONIX) 40 MG tablet Take 1 tablet (40 mg total) by mouth 2 (two) times daily. 60 tablet 3  . potassium chloride (K-DUR) 10 MEQ tablet Take 1 tablet (10 mEq total) by mouth daily. 30 tablet 3  . predniSONE (DELTASONE) 5 MG tablet Take 5 mg by mouth daily with breakfast.    . sucralfate (CARAFATE) 1 GM/10ML suspension Take 1 g by mouth 2 (two) times daily.    . vitamin B-12 (CYANOCOBALAMIN) 500 MCG tablet TAKE ONE TABLET BY MOUTH ONCE DAILY. 30 tablet 11   No current facility-administered medications on file prior to visit.    Past Medical History  Diagnosis Date  . HEARING LOSS   . MITRAL VALVE INSUFF&AORTIC VALVE INSUFF     moderate MR  . PULMONARY HYPERTENSION   . Atrial fibrillation ([place])     permanent  . SICK SINUS SYNDROME     a. Tachybrady syndrome - Guidant PPM 10/2005.  . Edema 03/19/2009    due to venous insufficiency, chronic lymphedema  . URINARY INCONTINENCE   . Rheumatoid arthritis(714.0) dx clarified 2011    On prednisone  . BREAST CANCER 12/2006    ductal ca s/p L lumpectomy  . Diastolic dysfunction   . HYPERTENSION   . HYPERLIPIDEMIA   . GERD   .  HYPOTHYROIDISM   . Hyperparathyroidism     2 lobes removed  . Venous insufficiency     chronic BLE edema  . Spinal stenosis   . Anemia     a. Macrocytic - normal B12, folate 08/2011. b. 3/6 heme positive stools 10/2011 - per PCP note, elected against colonscopy.  [name] TIA (transient ischemic attack) 05/2011  . Anxiety     Past Surgical History  Procedure Laterality Date  . Pacemaker placement  2005    SSS and syncope  . Cholecystectomy  04/06/99  . Abdominal hysterectomy  1970    Partial  . Left hip arthroscopy  1995  . Right hip arthroscopy  01/2001  . Carpal tunnel release  1998    bilateral  . Breast surgery  12/2006  Left breast lupectomy- Ductal CA  . Left parathyroidectomy  07/2008  . Esophagogastroduodenoscopy N/A 10/18/2012    Procedure: ESOPHAGOGASTRODUODENOSCOPY (EGD);  Surgeon: Ladene Artist, MD;  Location: [facility];  Service: Endoscopy;  Laterality: N/A;  . Esophagogastroduodenoscopy (egd) with propofol N/A 01/29/2015    Procedure: ESOPHAGOGASTRODUODENOSCOPY (EGD) WITH PROPOFOL;  Surgeon: Inda Castle, MD;  Location: [facility];  Service: Endoscopy;  Laterality: N/A;  . Ercp N/A 01/29/2015    Procedure: ENDOSCOPIC RETROGRADE CHOLANGIOPANCREATOGRAPHY (ERCP);  Surgeon: Inda Castle, MD;  Location: [facility];  Service: Endoscopy;  Laterality: N/A;    Social History   Social History  . Marital Status: Married    Spouse Name: N/A  . Number of Children: 6  . Years of Education: N/A   Occupational History  . Retired Agricultural engineer    Social History Main Topics  . Smoking status: Never Smoker   . Smokeless tobacco: Never Used  . Alcohol Use: No  . Drug Use: No  . Sexual Activity: Not Currently   Other Topics Concern  . Not on file   Social History Narrative   [facility] at [facility] 03[facility]- married, lives with spouse. Moved here from [facility] to be near son    Review of Systems  Constitutional: Negative for fever and chills.  HENT:  Negative for congestion and sore throat.   Respiratory: Negative for cough, shortness of breath and wheezing.   Cardiovascular: Positive for leg swelling. Negative for chest pain and palpitations.  Gastrointestinal: Negative for nausea and abdominal pain.       BM normal, few bouts of diarrhea   Genitourinary: Positive for dysuria (dysuria a couple of times). Frequency: she likes so.       Dark orange urine  Neurological: Positive for light-headedness (recently). Negative for dizziness, weakness, numbness and headaches.       Objective:   Filed Vitals:   03/18/15 1536  BP: 160/82  Pulse: 79  Temp: 98.2 F (36.8 C)  Resp: 16   Filed Weights   03/18/15 1536  Weight: 154 lb (69.854 kg)   Body mass index is 29.11 kg/(m^2).   Physical Exam  Constitutional: She appears well-developed and well-nourished. No distress.  HENT:  Head: Normocephalic and atraumatic.  Eyes: Conjunctivae are normal.  Neck: Neck supple. No JVD present. No tracheal deviation present. No thyromegaly present.  No JVD  Cardiovascular: Normal rate and regular rhythm.   No murmur heard. Pulmonary/Chest: Effort normal and breath sounds normal. No respiratory distress. She has no wheezes. She has no rales.  Musculoskeletal: She exhibits edema (2 + pitting edema to knees b/l associated with tenderness) and tenderness (Bilateral lower legs secondary to swelling).  Lymphadenopathy:    She has no cervical adenopathy.  Skin: Skin is warm and dry.  Patches of mild erythema lower legs bilaterally, but no warmth or generalized erythema  Psychiatric: She has a normal mood and affect. Her behavior is normal.          Assessment & Plan:   Leg edema, elevated blood pressure, lightheadedness This is my first time meeting her so I do not know her baseline leg edema, but according to her daughter and herself this is much more severe than usual. We will check CMP, BNP today Increase Lasix to 40 mg daily until directed  to stop We'll have her follow-up with her cardiologist Concern for possible diastolic dysfunction She saw podiatry in the already ordered bilateral leg ultrasound to rule out DVT-advised she should  have this done. She is on anticoagulation and blood thinners are slightly, but still should be ruled out We'll need to repeat blood work after a few days of the increase Lasix We'll need to monitor blood pressure-medications may need to be adjusted Follow-up on Monday She will see cardiology at same day  Dysuria Will check urinalysis, urine culture to rule out infection

## 2015-03-18 NOTE — Patient Instructions (Addendum)
Increase lasix to 40 mg daily until directed to stop  Have blood work and urine tests done today.  We will call you with the results.  Follow up with me next Monday, sooner if needed  We will cardiology and see if we can get you in soon.

## 2015-03-18 NOTE — Telephone Encounter (Signed)
Called [facility] inform rx ready for pick-up...Nicole Roberts

## 2015-03-19 ENCOUNTER — Telehealth: Payer: Self-pay | Admitting: *Deleted

## 2015-03-19 LAB — COMPREHENSIVE METABOLIC PANEL
ALT: 19 U/L (ref 0–35)
AST: 21 U/L (ref 0–37)
Albumin: 3.8 g/dL (ref 3.5–5.2)
Alkaline Phosphatase: 76 U/L (ref 39–117)
BUN: 17 mg/dL (ref 6–23)
CHLORIDE: 105 meq/L (ref 96–112)
CO2: 26 mEq/L (ref 19–32)
Calcium: 8.7 mg/dL (ref 8.4–10.5)
Creatinine, Ser: 1.31 mg/dL — ABNORMAL HIGH (ref 0.40–1.20)
GFR: 40.44 mL/min — AB (ref >60.00)
GLUCOSE: 119 mg/dL — AB (ref 70–99)
POTASSIUM: 4.7 meq/L (ref 3.5–5.1)
SODIUM: 143 meq/L (ref 135–145)
Total Bilirubin: 0.5 mg/dL (ref 0.2–1.2)
Total Protein: 6.6 g/dL (ref 6.0–8.3)

## 2015-03-19 NOTE — Telephone Encounter (Signed)
SPOKE WITH VALERY ,RN  WHO WANTED ME TO CONFIRM THAT THE ORDER THAT WAS SENT WAS CORRECT FOR PATIENTS UP COMING TEST I WILL SPEAK WITH [facility] IN THE MORNING TO [place] THAT THE ORDER ,AND TEST IS CORRECT [facility] BILLING

## 2015-03-19 NOTE — Telephone Encounter (Signed)
Pt's son, Arnell Sieving called asked if the Ultrasound of pt's legs had been done. Informed Arnell Sieving that pt had been referred to [facility] by Dr. Jacqualyn Posey.

## 2015-03-20 ENCOUNTER — Telehealth: Payer: Self-pay | Admitting: *Deleted

## 2015-03-20 DIAGNOSIS — R609 Edema, unspecified: Secondary | ICD-10-CM

## 2015-03-22 NOTE — Progress Notes (Signed)
Cardiology Office Note   Date:  03/23/2015   ID:  Nicole Roberts, DOB [birth year], MRN [id number]  PCP:  [name], MD  Cardiologist:  Dr. Rayann Heman    Chief Complaint  Patient presents with  . Leg Swelling    swelling in both legs, wears support hose for help.       History of Present Illness: Nicole Roberts is a 81 y.o. female who presents for swelling in both legs. Her PCP increased her lasix to 40 mg daily, she is scheduled for venous dopplers, BNP 288 lower for her -Cr elevated at 1.31 TSH normal CBC stable.   She has a hx of permanent a fib.  She has hx symptomatic brady with PPM. Also hx of HTN.   Last echo 11/2012 with EF 45% , mod, TR, PA pk pressure was 48 mmHg Today she notes she has been on increased of lasix for 2 days.  The edema has been going on for about 6 weeks.  No SOB.  No chest pain.  She has support stockings on but they roll and make indentions.  She has increased going to [place] to void.     We discussed new support stockings but once her edema has improved she can wear her current ones.  We discussed salt restriction.   Past Medical History  Diagnosis Date  . HEARING LOSS   . MITRAL VALVE INSUFF&AORTIC VALVE INSUFF     moderate MR  . PULMONARY HYPERTENSION   . Atrial fibrillation ([place])     permanent  . SICK SINUS SYNDROME     a. Tachybrady syndrome - Guidant PPM 10/2005.  . Edema 03/19/2009    due to venous insufficiency, chronic lymphedema  . URINARY INCONTINENCE   . Rheumatoid arthritis(714.0) dx clarified 2011    On prednisone  . BREAST CANCER 12/2006    ductal ca s/p L lumpectomy  . Diastolic dysfunction   . HYPERTENSION   . HYPERLIPIDEMIA   . GERD   . HYPOTHYROIDISM   . Hyperparathyroidism     2 lobes removed  . Venous insufficiency     chronic BLE edema  . Spinal stenosis   . Anemia     a. Macrocytic - normal B12, folate 08/2011. b. 3/6 heme positive stools 10/2011 - per PCP note, elected against colonscopy.  [name] TIA (transient ischemic  attack) 05/2011  . Anxiety     Past Surgical History  Procedure Laterality Date  . Pacemaker placement  2005    SSS and syncope  . Cholecystectomy  04/06/99  . Abdominal hysterectomy  1970    Partial  . Left hip arthroscopy  1995  . Right hip arthroscopy  01/2001  . Carpal tunnel release  1998    bilateral  . Breast surgery  12/2006    Left breast lupectomy- Ductal CA  . Left parathyroidectomy  07/2008  . Esophagogastroduodenoscopy N/A 10/18/2012    Procedure: ESOPHAGOGASTRODUODENOSCOPY (EGD);  Surgeon: Ladene Artist, MD;  Location: [facility];  Service: Endoscopy;  Laterality: N/A;  . Esophagogastroduodenoscopy (egd) with propofol N/A 01/29/2015    Procedure: ESOPHAGOGASTRODUODENOSCOPY (EGD) WITH PROPOFOL;  Surgeon: Inda Castle, MD;  Location: [facility];  Service: Endoscopy;  Laterality: N/A;  . Ercp N/A 01/29/2015    Procedure: ENDOSCOPIC RETROGRADE CHOLANGIOPANCREATOGRAPHY (ERCP);  Surgeon: Inda Castle, MD;  Location: [facility];  Service: Endoscopy;  Laterality: N/A;     Current Outpatient Prescriptions  Medication Sig Dispense Refill  . alum & mag hydroxide-simeth (MAALOX/MYLANTA)  200-200-20 MG/5ML suspension Take 30 mLs by mouth every 6 (six) hours as needed for indigestion or heartburn.    [name] apixaban (ELIQUIS) 2.5 MG TABS tablet Take 1 tablet (2.5 mg total) by mouth 2 (two) times daily. 60 tablet 11  . Artificial Tear Ointment (ARTIFICIAL TEARS) ointment Place 1 drop into both eyes 3 (three) times daily.    [name] atorvastatin (LIPITOR) 10 MG tablet Take 10 mg by mouth at bedtime.    . calcium citrate-vitamin D (CITRACAL+D) 315-200 MG-UNIT per tablet Take 1 tablet by mouth 2 (two) times daily.    . cholecalciferol (VITAMIN D) 1000 UNITS tablet Take 1,000 Units by mouth daily.    . citalopram (CELEXA) 20 MG tablet Take 20 mg by mouth daily.    [name] conjugated estrogens (PREMARIN) vaginal cream Place 1 Applicatorful vaginally 3 (three) times a week.    . fentaNYL  (DURAGESIC - DOSED MCG/HR) 75 MCG/HR Place 1 patch (75 mcg total) onto the skin every 3 (three) days. 10 patch 0  . folic acid (FOLVITE) A999333 MCG tablet Take 400 mcg by mouth every morning.    . furosemide (LASIX) 20 MG tablet Take 2 tablets (40 mg total) by mouth daily. Until directed to decrease back to 20 mg daily 180 tablet 1  . gabapentin (NEURONTIN) 400 MG capsule Take 2 capsules (800 mg total) by mouth at bedtime. 180 capsule 1  . HYDROcodone-acetaminophen (NORCO/VICODIN) 5-325 MG tablet Take 1 tablet by mouth every 6 (six) hours as needed for moderate pain. 30 tablet 0  . isosorbide mononitrate (IMDUR) 30 MG 24 hr tablet Take 15 mg by mouth daily.    [name] levothyroxine (SYNTHROID, LEVOTHROID) 50 MCG tablet Take 1 tablet (50 mcg total) by mouth daily. 90 tablet 3  . metoprolol succinate (TOPROL-XL) 50 MG 24 hr tablet Take 1 tablet (50 mg total) by mouth 2 (two) times daily. Take with or immediately following a meal. 60 tablet 11  . Multiple Vitamin (MULTIVITAMIN) LIQD Take 5 mLs by mouth daily.    . nitroGLYCERIN (NITROSTAT) 0.4 MG SL tablet Place 0.4 mg under the tongue every 5 (five) minutes x 3 doses as needed for chest pain.    [name] ondansetron (ZOFRAN) 4 MG tablet Take 1 tablet (4 mg total) by mouth every 12 (twelve) hours as needed for nausea. For nausea 30 tablet 1  . pantoprazole (PROTONIX) 40 MG tablet Take 1 tablet (40 mg total) by mouth 2 (two) times daily. 60 tablet 3  . potassium chloride (K-DUR) 10 MEQ tablet Take 1 tablet (10 mEq total) by mouth daily. 30 tablet 3  . predniSONE (DELTASONE) 5 MG tablet Take 5 mg by mouth daily with breakfast.    . sucralfate (CARAFATE) 1 GM/10ML suspension Take 1 g by mouth 2 (two) times daily.    . vitamin B-12 (CYANOCOBALAMIN) 500 MCG tablet TAKE ONE TABLET BY MOUTH ONCE DAILY. 30 tablet 11   No current facility-administered medications for this visit.    Allergies:   Sulfa antibiotics and Tramadol hcl    Social History:  The patient  reports  that she has never smoked. She has never used smokeless tobacco. She reports that she does not drink alcohol or use illicit drugs.   Family History:  The patient's family history includes Coronary artery disease in her brother and sister; Hypertension in her mother; Lung cancer in her father; Ovarian cancer in her mother.    ROS:  General:no colds or fevers,  weight is up 3 lbs.  Skin:no rashes +small ulcer Lt leg from fluid draining.  HEENT:no blurred vision, no congestion CV:see HPI PUL:see HPI GI:no diarrhea constipation or melena, no indigestion GU:no hematuria, no dysuria MS:no joint pain, no claudication Neuro:no syncope, no lightheadedness Endo:no diabetes, + thyroid disease  Wt Readings from Last 3 Encounters:  03/23/15 157 lb (71.215 kg)  03/18/15 154 lb (69.854 kg)  01/25/15 163 lb 9.3 oz (74.2 kg)     PHYSICAL EXAM: VS:  BP 132/72 mmHg  Pulse 88  Ht 5\' 1"  (1.549 m)  Wt 157 lb (71.215 kg)  BMI 29.68 kg/m2 , BMI Body mass index is 29.68 kg/(m^2). General:Pleasant affect, NAD Skin:Warm and dry, brisk capillary refill HEENT:normocephalic, sclera clear, mucus membranes moist Neck:supple, no JVD, no bruits  Heart:S1S2 RRR without murmur, gallup, rub or click Lungs:clear without rales, rhonchi, or wheezes [id number], non tender, + BS, do not palpate liver spleen or masses Ext:2-3+ lower ext edema bil, small area of fluid drainage Lt leg, 2+ radial pulses Neuro:alert and oriented X 3, MAE, follows commands, + facial symmetry    EKG:  EKG is NOT ordered today.     Recent Labs: 01/27/2015: Magnesium 1.3* 03/18/2015: ALT 19; BUN 17; Creatinine, Ser 1.31*; Hemoglobin 11.9*; Platelets 145.0*; Potassium 4.7; Pro B Natriuretic peptide (BNP) 288.0*; Sodium 143; TSH 1.39    Lipid Panel    Component Value Date/Time   CHOL 139 06/02/2014 1153   TRIG 171.0* 06/02/2014 1153   HDL 70.50 06/02/2014 1153   CHOLHDL 2 06/02/2014 1153   VLDL 34.2 06/02/2014 1153   LDLCALC 34  06/02/2014 1153   LDLDIRECT 131.4 12/28/2009 1013       Other studies Reviewed: Additional studies/ records that were reviewed today include: previous office notes, EKGs, .   ASSESSMENT AND PLAN:  1.  Lower ext edema bil.  Continue lasix at 40 mg BID but if no improvement by Wed. Then begin metolazone 2.5 mg on MWF.  But if edema improving do not begin.  Will check BMP on Monday next week.  Follow up in 3 weeks, will check Echo to eval LV function, valvular disease.   2. Permanent a fib  On anticoagulation.  Eliquis.  3. Hx PPM for symptomatic brady.  4.  HTN controlled  5. Moderate MR   Current medicines are reviewed with the patient today.  The patient Has no concerns regarding medicines.  The following changes have been made:  See above Labs/ tests ordered today include:see above  Disposition:   FU:  see above  Signed, Isaiah Serge, NP  03/23/2015 10:08 AM    [facility] [place], [place], [place], [place] Phone: [phone]; Fax: [phone]

## 2015-03-23 ENCOUNTER — Encounter: Payer: Self-pay | Admitting: Internal Medicine

## 2015-03-23 ENCOUNTER — Ambulatory Visit (INDEPENDENT_AMBULATORY_CARE_PROVIDER_SITE_OTHER): Payer: Medicare Other | Admitting: Cardiology

## 2015-03-23 ENCOUNTER — Ambulatory Visit: Payer: Medicare Other | Admitting: Internal Medicine

## 2015-03-23 ENCOUNTER — Ambulatory Visit (INDEPENDENT_AMBULATORY_CARE_PROVIDER_SITE_OTHER): Payer: Medicare Other | Admitting: Internal Medicine

## 2015-03-23 ENCOUNTER — Other Ambulatory Visit (INDEPENDENT_AMBULATORY_CARE_PROVIDER_SITE_OTHER): Payer: Medicare Other

## 2015-03-23 ENCOUNTER — Encounter: Payer: Self-pay | Admitting: Cardiology

## 2015-03-23 VITALS — BP 132/72 | HR 88 | Ht 61.0 in | Wt 157.0 lb

## 2015-03-23 VITALS — BP 114/76 | HR 76 | Temp 98.2°F | Resp 16 | Wt 157.0 lb

## 2015-03-23 DIAGNOSIS — I482 Chronic atrial fibrillation, unspecified: Secondary | ICD-10-CM

## 2015-03-23 DIAGNOSIS — I272 Other secondary pulmonary hypertension: Secondary | ICD-10-CM

## 2015-03-23 DIAGNOSIS — R600 Localized edema: Secondary | ICD-10-CM | POA: Diagnosis not present

## 2015-03-23 DIAGNOSIS — M7989 Other specified soft tissue disorders: Secondary | ICD-10-CM | POA: Diagnosis not present

## 2015-03-23 DIAGNOSIS — I429 Cardiomyopathy, unspecified: Secondary | ICD-10-CM

## 2015-03-23 LAB — BASIC METABOLIC PANEL
BUN: 28 mg/dL — AB (ref 6–23)
CHLORIDE: 104 meq/L (ref 96–112)
CO2: 30 mEq/L (ref 19–32)
Calcium: 8.9 mg/dL (ref 8.4–10.5)
Creatinine, Ser: 1.29 mg/dL — ABNORMAL HIGH (ref 0.40–1.20)
GFR: 41.16 mL/min — ABNORMAL LOW (ref >60.00)
Glucose, Bld: 123 mg/dL — ABNORMAL HIGH (ref 70–99)
POTASSIUM: 4.3 meq/L (ref 3.5–5.1)
Sodium: 141 mEq/L (ref 135–145)

## 2015-03-23 MED ORDER — METOLAZONE 2.5 MG PO TABS: ORAL_TABLET | ORAL | Status: DC

## 2015-03-23 NOTE — Assessment & Plan Note (Signed)
No improvement with increasing Lasix dose to 40 mg daily Zaroxolyn added by cardiology-she will take 3 times a week She is using pneumonic compression of her legs and tries to elevate them when able She is not compliant with a low-sodium diet-stressed the importance of compliance Will check BMP today Has follow-up with cardiology next week and will have another BMP Ultrasound of legs scheduled for tomorrow to rule out blood clots Echo scheduled next week  She is a follow-up with me in approximately 3 months, but will return sooner if needed since she is following with cardiology

## 2015-03-23 NOTE — Progress Notes (Signed)
Subjective:    Patient ID: Nicole Roberts, female    DOB: Jun 22, 1921, 80 y.o.   MRN: [id number]  HPI She is here for follow up today for her severe leg edema.   She also saw cardiology earlier today.  She is scheduled for an [place] of her legs to rule out blood clots tomorrow and an Echo next week.    We increased her lasix dose last week to 40 mg daily. She denies any change in her leg swelling. She denies chest pain, palpitations and abdominal pain. She does experience some shortness of breath with exertion, which is not new. She has occasional lightheadedness.    Cardiology added zaroxyolyn today.  She has a follow up with them next week.    Her son state that she had something similar happen to her legs a few years ago. They're unsure why that happened then, but it took approximately 9 months for it to completely resolve.  Medications reviewed and no updates were needed.  Patient Active Problem List   Diagnosis Date Noted  . Acute gallstone pancreatitis 02/05/2015  . Gastritis and gastroduodenitis 01/30/2015  . Possible Acute cholangitis 01/30/2015  . Sepsis ([place]) 01/29/2015  . Choledocholithiasis 01/29/2015  . Abnormal LFTs   . Edema leg 01/27/2015  . Diarrhea   . Duodenitis 01/26/2015  . Bacteremia due to Escherichia coli 01/25/2015  . Sepsis due to Escherichia coli (E. coli) ([place]) 01/25/2015  . Thrombocytopenia ([place]) 01/25/2015  . Protein-calorie malnutrition, moderate ([place]) 01/25/2015  . Wheelchair bound 01/25/2015  . Acute encephalopathy 01/25/2015  . Recurrent cold sores 01/25/2015  . UTI (urinary tract infection) 01/24/2015  . Acute pancreatitis 01/23/2015  . Abdominal pain, epigastric 01/23/2015  . Hypokalemia 01/23/2015  . Leukocytosis 01/23/2015  . Abnormal LFTs (liver function tests) 01/23/2015  . Neuropathy ([place]) 01/23/2015  . Chronic combined systolic and diastolic CHF (congestive heart failure) ([place]) 01/23/2015  . Depression   . Stage III chronic kidney  disease 04/06/2012  . Atrial fibrillation ([place]) 02/05/2012  . Rheumatoid arthritis ([place]) 01/26/2010  . Hypothyroidism 05/21/2009  . Dyslipidemia 05/21/2009  . Essential hypertension 05/21/2009  . GERD 05/21/2009    Current Outpatient Prescriptions on File Prior to Visit  Medication Sig Dispense Refill  . apixaban (ELIQUIS) 2.5 MG TABS tablet Take 1 tablet (2.5 mg total) by mouth 2 (two) times daily. 60 tablet 11  . Artificial Tear Ointment (ARTIFICIAL TEARS) ointment Place 1 drop into both eyes 3 (three) times daily.    [name] atorvastatin (LIPITOR) 10 MG tablet Take 10 mg by mouth at bedtime.    . calcium citrate-vitamin D (CITRACAL+D) 315-200 MG-UNIT per tablet Take 1 tablet by mouth 2 (two) times daily.    . cholecalciferol (VITAMIN D) 1000 UNITS tablet Take 1,000 Units by mouth daily.    . citalopram (CELEXA) 20 MG tablet Take 20 mg by mouth daily.    . fentaNYL (DURAGESIC - DOSED MCG/HR) 75 MCG/HR Place 1 patch (75 mcg total) onto the skin every 3 (three) days. 10 patch 0  . folic acid (FOLVITE) A999333 MCG tablet Take 400 mcg by mouth every morning.    . furosemide (LASIX) 20 MG tablet Take 2 tablets (40 mg total) by mouth daily. Until directed to decrease back to 20 mg daily 180 tablet 1  . gabapentin (NEURONTIN) 400 MG capsule Take 2 capsules (800 mg total) by mouth at bedtime. 180 capsule 1  . HYDROcodone-acetaminophen (NORCO/VICODIN) 5-325 MG tablet Take 1 tablet by mouth every 6 (  six) hours as needed for moderate pain. 30 tablet 0  . isosorbide mononitrate (IMDUR) 30 MG 24 hr tablet Take 15 mg by mouth daily.    [name] levothyroxine (SYNTHROID, LEVOTHROID) 50 MCG tablet Take 1 tablet (50 mcg total) by mouth daily. 90 tablet 3  . metolazone (ZAROXOLYN) 2.5 MG tablet If no improvement in edema by Wednesday (03/25/15), start taking one tablet every Monday, Wednesday and Friday for edema.  Take 30 minutes prior to Lasix. 15 tablet 6  . metoprolol succinate (TOPROL-XL) 50 MG 24 hr tablet Take 1  tablet (50 mg total) by mouth 2 (two) times daily. Take with or immediately following a meal. 60 tablet 11  . Multiple Vitamin (MULTIVITAMIN) LIQD Take 5 mLs by mouth daily.    . nitroGLYCERIN (NITROSTAT) 0.4 MG SL tablet Place 0.4 mg under the tongue every 5 (five) minutes x 3 doses as needed for chest pain.    [name] ondansetron (ZOFRAN) 4 MG tablet Take 1 tablet (4 mg total) by mouth every 12 (twelve) hours as needed for nausea. For nausea 30 tablet 1  . pantoprazole (PROTONIX) 40 MG tablet Take 1 tablet (40 mg total) by mouth 2 (two) times daily. 60 tablet 3  . potassium chloride (K-DUR) 10 MEQ tablet Take 1 tablet (10 mEq total) by mouth daily. 30 tablet 3  . predniSONE (DELTASONE) 5 MG tablet Take 5 mg by mouth daily with breakfast.    . sucralfate (CARAFATE) 1 GM/10ML suspension Take 1 g by mouth 2 (two) times daily.    . vitamin B-12 (CYANOCOBALAMIN) 500 MCG tablet TAKE ONE TABLET BY MOUTH ONCE DAILY. 30 tablet 11   No current facility-administered medications on file prior to visit.    Past Medical History  Diagnosis Date  . HEARING LOSS   . MITRAL VALVE INSUFF&AORTIC VALVE INSUFF     moderate MR  . PULMONARY HYPERTENSION   . Atrial fibrillation ([place])     permanent  . SICK SINUS SYNDROME     a. Tachybrady syndrome - Guidant PPM 10/2005.  . Edema 03/19/2009    due to venous insufficiency, chronic lymphedema  . URINARY INCONTINENCE   . Rheumatoid arthritis(714.0) dx clarified 2011    On prednisone  . BREAST CANCER 12/2006    ductal ca s/p L lumpectomy  . Diastolic dysfunction   . HYPERTENSION   . HYPERLIPIDEMIA   . GERD   . HYPOTHYROIDISM   . Hyperparathyroidism     2 lobes removed  . Venous insufficiency     chronic BLE edema  . Spinal stenosis   . Anemia     a. Macrocytic - normal B12, folate 08/2011. b. 3/6 heme positive stools 10/2011 - per PCP note, elected against colonscopy.  [name] TIA (transient ischemic attack) 05/2011  . Anxiety     Past Surgical History  Procedure  Laterality Date  . Pacemaker placement  2005    SSS and syncope  . Cholecystectomy  04/06/99  . Abdominal hysterectomy  1970    Partial  . Left hip arthroscopy  1995  . Right hip arthroscopy  01/2001  . Carpal tunnel release  1998    bilateral  . Breast surgery  12/2006    Left breast lupectomy- Ductal CA  . Left parathyroidectomy  07/2008  . Esophagogastroduodenoscopy N/A 10/18/2012    Procedure: ESOPHAGOGASTRODUODENOSCOPY (EGD);  Surgeon: Ladene Artist, MD;  Location: [facility];  Service: Endoscopy;  Laterality: N/A;  . Esophagogastroduodenoscopy (egd) with propofol N/A 01/29/2015  Procedure: ESOPHAGOGASTRODUODENOSCOPY (EGD) WITH PROPOFOL;  Surgeon: Inda Castle, MD;  Location: [facility];  Service: Endoscopy;  Laterality: N/A;  . Ercp N/A 01/29/2015    Procedure: ENDOSCOPIC RETROGRADE CHOLANGIOPANCREATOGRAPHY (ERCP);  Surgeon: Inda Castle, MD;  Location: [facility];  Service: Endoscopy;  Laterality: N/A;    Social History   Social History  . Marital Status: Married    Spouse Name: N/A  . Number of Children: 6  . Years of Education: N/A   Occupational History  . Retired Agricultural engineer    Social History Main Topics  . Smoking status: Never Smoker   . Smokeless tobacco: Never Used  . Alcohol Use: No  . Drug Use: No  . Sexual Activity: Not Currently   Other Topics Concern  . Not on file   Social History Narrative   Lives at [facility] since 03/[facility]- married, lives with spouse. Moved here from [facility] to be near son    Review of Systems  Constitutional: Negative for fever and chills.  Respiratory: Positive for shortness of breath.   Cardiovascular: Positive for leg swelling. Negative for chest pain and palpitations.  Gastrointestinal: Negative for abdominal pain.  Neurological: Positive for light-headedness. Negative for headaches.       Objective:   Filed Vitals:   03/23/15 1612  BP: 114/76  Pulse: 76  Temp: 98.2 F (36.8 C)  Resp:  16   Filed Weights   03/23/15 1612  Weight: 157 lb (71.215 kg)   Body mass index is 29.68 kg/(m^2).   Physical Exam  Constitutional: She appears well-developed and well-nourished. No distress.  Cardiovascular: Normal rate and regular rhythm.   Pulmonary/Chest: Effort normal and breath sounds normal. No respiratory distress. She has no wheezes. She has no rales.  Musculoskeletal: She exhibits edema (3 + pitting edema to knees b/l).  Skin: Skin is warm and dry. She is not diaphoretic.  No open wounds or skin breakage in legs  Psychiatric: She has a normal mood and affect. Her behavior is normal.          Assessment & Plan:   See Problem List.

## 2015-03-23 NOTE — Patient Instructions (Signed)
Medication Instructions:  1) If no improvement in swelling by Wednesday (03/25/15), please START Metolazone 2.5mg  once daily on Monday, Wednesday and Friday.  Take 30 minutes prior to taking Lasix. If you begin to feel weak and tired from increased diuretics please contact our office.  Labwork: BMP on Monday  Testing/Procedures: Your physician has requested that you have an echocardiogram. Echocardiography is a painless test that uses sound waves to create images of your heart. It provides your doctor with information about the size and shape of your heart and how well your heart's chambers and valves are working. This procedure takes approximately one hour. There are no restrictions for this procedure.   Follow-Up: Your physician recommends that you schedule a follow-up appointment in: 3 weeks with an extender.   Any Other Special Instructions Will Be Listed Below (If Applicable).     If you need a refill on your cardiac medications before your next appointment, please call your pharmacy.

## 2015-03-23 NOTE — Telephone Encounter (Signed)
Entered in error

## 2015-03-23 NOTE — Patient Instructions (Signed)
No change in medications at this time.  We will check your kidney function today.   Follow up with cardiology as planned.

## 2015-03-23 NOTE — Progress Notes (Signed)
Pre visit review using our clinic review tool, if applicable. No additional management support is needed unless otherwise documented below in the visit note. 

## 2015-03-24 ENCOUNTER — Ambulatory Visit (HOSPITAL_COMMUNITY)
Admission: RE | Admit: 2015-03-24 | Discharge: 2015-03-24 | Disposition: A | Discharge status: Home / Self Care | Payer: Medicare Other | Source: Ambulatory Visit | Attending: Cardiovascular Disease | Admitting: Cardiovascular Disease

## 2015-03-24 DIAGNOSIS — I10 Essential (primary) hypertension: Secondary | ICD-10-CM | POA: Insufficient documentation

## 2015-03-24 DIAGNOSIS — E785 Hyperlipidemia, unspecified: Secondary | ICD-10-CM | POA: Insufficient documentation

## 2015-03-24 DIAGNOSIS — R609 Edema, unspecified: Secondary | ICD-10-CM | POA: Diagnosis not present

## 2015-03-30 ENCOUNTER — Telehealth: Payer: Self-pay | Admitting: *Deleted

## 2015-03-30 ENCOUNTER — Other Ambulatory Visit: Payer: Medicare Other

## 2015-03-30 NOTE — Telephone Encounter (Addendum)
-----   Message from Trula Slade, DPM sent at 03/29/2015  5:42 PM EST ----- Can you please let her know that the venous study was negative for clot or incompetence of the veins. She does have volume overload and she needs to get in with her PCP this week. Thanks. Informed pt of results and Dr. Leigh Aurora recommendation and pt states she has an appt with her primary doctor this Thursday, and the doctor has given her 2 different pills, one she takes in the morning and then 77minutes later takes another different 2 pills and she stays in the bathroom to get rid of the fluid.

## 2015-04-02 ENCOUNTER — Other Ambulatory Visit (INDEPENDENT_AMBULATORY_CARE_PROVIDER_SITE_OTHER): Payer: Medicare Other | Admitting: *Deleted

## 2015-04-02 ENCOUNTER — Ambulatory Visit (HOSPITAL_COMMUNITY): Payer: Medicare Other | Attending: Internal Medicine

## 2015-04-02 ENCOUNTER — Other Ambulatory Visit: Payer: Self-pay

## 2015-04-02 DIAGNOSIS — I071 Rheumatic tricuspid insufficiency: Secondary | ICD-10-CM | POA: Diagnosis not present

## 2015-04-02 DIAGNOSIS — I429 Cardiomyopathy, unspecified: Secondary | ICD-10-CM | POA: Insufficient documentation

## 2015-04-02 DIAGNOSIS — I272 Other secondary pulmonary hypertension: Secondary | ICD-10-CM | POA: Diagnosis not present

## 2015-04-02 DIAGNOSIS — I358 Other nonrheumatic aortic valve disorders: Secondary | ICD-10-CM | POA: Diagnosis not present

## 2015-04-02 DIAGNOSIS — I517 Cardiomegaly: Secondary | ICD-10-CM | POA: Diagnosis not present

## 2015-04-02 DIAGNOSIS — I340 Nonrheumatic mitral (valve) insufficiency: Secondary | ICD-10-CM | POA: Insufficient documentation

## 2015-04-02 DIAGNOSIS — M7989 Other specified soft tissue disorders: Secondary | ICD-10-CM

## 2015-04-02 LAB — BASIC METABOLIC PANEL
BUN: 36 mg/dL — ABNORMAL HIGH (ref 7–25)
CHLORIDE: 98 mmol/L (ref 98–110)
CO2: 33 mmol/L — ABNORMAL HIGH (ref 20–31)
Calcium: 8.9 mg/dL (ref 8.6–10.4)
Creat: 1.48 mg/dL — ABNORMAL HIGH (ref 0.60–0.88)
Glucose, Bld: 89 mg/dL (ref 65–99)
POTASSIUM: 3.2 mmol/L — AB (ref 3.5–5.3)
Sodium: 143 mmol/L (ref 135–146)

## 2015-04-04 ENCOUNTER — Telehealth: Payer: Self-pay | Admitting: Physician Assistant

## 2015-04-04 NOTE — Telephone Encounter (Signed)
Cecilie Kicks NP was checking her labs this weekend and noted patient to be hypokalemic. She recommends the patient to take the following prescription:  Potassium chloride 79meq tablets - Take 20 meq now - Then 40meq tomorrow - Then 15meq daily thereafter.  She requested I write the prescription and fax to [facility]. I was able to do so. I also called [facility] to notify them fax was on the way.  Per Mickel Baas, will need recheck labs when she is seen in the office this week - will forward to the provider that will see her.  Dayna Dunn PA-C

## 2015-04-06 ENCOUNTER — Telehealth: Payer: Self-pay | Admitting: Internal Medicine

## 2015-04-06 ENCOUNTER — Other Ambulatory Visit: Payer: Self-pay | Admitting: *Deleted

## 2015-04-06 DIAGNOSIS — E876 Hypokalemia: Secondary | ICD-10-CM

## 2015-04-06 NOTE — Telephone Encounter (Signed)
Should contact cardio -- they started it and is going to f/u with them

## 2015-04-06 NOTE — Telephone Encounter (Signed)
Spoke with Carlyon Shadow and let her know I can not change the order as it was sent in to them on 04/04/15.  The PA called and confirmed the order was received.  The medications were never started and she wanted me to rewrite with a start date of today.  I let her know the patient is being seen tomorrow and will likely have to have repeat labs and a new order written.  When asked why the original order was never started, I could not get any explanation.

## 2015-04-06 NOTE — Telephone Encounter (Signed)
Please advise 

## 2015-04-06 NOTE — Telephone Encounter (Signed)
New message      Calling to ask the nurse to refax the potassium order with the date to start rx today.  The original order says start on last sat.  Please refax order to [phone] attn Darlene.  Also, please call when it has been faxed so that the nurse will get it .

## 2015-04-06 NOTE — Telephone Encounter (Signed)
New message      Calling to get clarification on presc for potassium.  Pt has an appt tomorrow.

## 2015-04-06 NOTE — Telephone Encounter (Signed)
Cecilie Kicks NP was checking her labs this weekend and noted patient to be hypokalemic. She recommends the patient to take the following prescription:  Potassium chloride 35meq tablets - Take 20 meq now - Then 34meq tomorrow - Then 41meq daily thereafter.  She requested I write the prescription and fax to [facility]. I was able to do so. I also called [facility] to notify them fax was on the way.  Per Mickel Baas, will need recheck labs when she is seen in the office this week - will forward to the provider that will see her.  Dayna Dunn PA-C  Called and spoke with Nicole Roberts and gave her these instructions.  She has an appointment tomorrow with PA at 1:15. She has not started the Potassium as of today and wanted to let us know.  She will give her dose today but patient should have labs repeated later in the week and not tomorrow

## 2015-04-06 NOTE — Telephone Encounter (Signed)
Pt's son called wondering how long pt should be taking metolazone (ZAROXOLYN) 2.5 MG tablet [id number] Please call him at [phone]

## 2015-04-07 ENCOUNTER — Encounter: Payer: Self-pay | Admitting: Physician Assistant

## 2015-04-07 ENCOUNTER — Ambulatory Visit (INDEPENDENT_AMBULATORY_CARE_PROVIDER_SITE_OTHER): Payer: Medicare Other | Admitting: Physician Assistant

## 2015-04-07 ENCOUNTER — Other Ambulatory Visit: Payer: Medicare Other | Admitting: *Deleted

## 2015-04-07 VITALS — BP 130/68 | HR 80 | Ht 61.0 in | Wt 134.0 lb

## 2015-04-07 DIAGNOSIS — I5042 Chronic combined systolic (congestive) and diastolic (congestive) heart failure: Secondary | ICD-10-CM

## 2015-04-07 DIAGNOSIS — Z7689 Persons encountering health services in other specified circumstances: Secondary | ICD-10-CM

## 2015-04-07 DIAGNOSIS — N183 Chronic kidney disease, stage 3 unspecified: Secondary | ICD-10-CM

## 2015-04-07 DIAGNOSIS — I10 Essential (primary) hypertension: Secondary | ICD-10-CM

## 2015-04-07 DIAGNOSIS — E876 Hypokalemia: Secondary | ICD-10-CM

## 2015-04-07 LAB — BASIC METABOLIC PANEL
BUN: 37 mg/dL — AB (ref 7–25)
CHLORIDE: 95 mmol/L — AB (ref 98–110)
CO2: 32 mmol/L — ABNORMAL HIGH (ref 20–31)
Calcium: 8.1 mg/dL — ABNORMAL LOW (ref 8.6–10.4)
Creat: 1.55 mg/dL — ABNORMAL HIGH (ref 0.60–0.88)
GLUCOSE: 104 mg/dL — AB (ref 65–99)
POTASSIUM: 3 mmol/L — AB (ref 3.5–5.3)
Sodium: 143 mmol/L (ref 135–146)

## 2015-04-07 MED ORDER — POTASSIUM CHLORIDE ER 20 MEQ PO TBCR: 20.0000 meq | EXTENDED_RELEASE_TABLET | Freq: Every day | ORAL | Status: DC

## 2015-04-07 NOTE — Patient Instructions (Signed)
Medication Instructions:  Your physician has recommended you make the following change in your medication:  1) STOP Metolazone  2) INCREASE Potassium to 46meq daily   Labwork: Bmet today  Testing/Procedures: None ordered  Follow-Up: Your physician recommends that you schedule a follow-up appointment in: 4 weeks with Dr.Allred  Any Other Special Instructions Will Be Listed Below (If Applicable).     If you need a refill on your cardiac medications before your next appointment, please call your pharmacy.

## 2015-04-07 NOTE — Progress Notes (Signed)
Cardiology Office Note   Date:  04/07/2015   ID:  Nicole Roberts, DOB 31-Jan-1922, MRN [id number]  PCP:  [name], MD  Cardiologist: Dr. Rayann Heman   Chief Complaint: Swelling    History of Present Illness: Nicole Roberts is a 81 y.o. female who presents for one-week follow-up of heart failure. She was seen by Cecilie Kicks NP on 03/23/15 for continued lower extremity edema. She increased her Lasix at 40 mg  a day and if no improvement again metolazone Monday Wednesday Friday. Her labs were checked and potassium was 3.2. Potassium was called in but she never received it. There was some mix up with [facility].. Lower extremity venous Dopplers on 03/24/15 were negative for DVT. 2-D echo showed normal LV function EF 55-60% with mild LVH, aortic valve sclerosis without stenosis, trivial mitral regurgitation, moderate tricuspid regurgitation, pulmonary artery pressures 50 mmHg. Improved EF 45% in 2014 improved.  Patient also has chronic atrial fibrillation on Eliquis, history of permanent pacemaker for symptomatic bradycardia, hypertension, moderate MR.  Patient comes in today accompanied by her son. She has lost 23 pounds in the past 2 weeks. Her swelling is much better. She says her baseline weight is 140-145 pounds. She weighs 134 pounds today. She denies any dyspnea, chest pain dizziness or presyncope.    Past Medical History  Diagnosis Date  . HEARING LOSS   . MITRAL VALVE INSUFF&AORTIC VALVE INSUFF     moderate MR  . PULMONARY HYPERTENSION   . Atrial fibrillation ([place])     permanent  . SICK SINUS SYNDROME     a. Tachybrady syndrome - Guidant PPM 10/2005.  . Edema 03/19/2009    due to venous insufficiency, chronic lymphedema  . URINARY INCONTINENCE   . Rheumatoid arthritis(714.0) dx clarified 2011    On prednisone  . BREAST CANCER 12/2006    ductal ca s/p L lumpectomy  . Diastolic dysfunction   . HYPERTENSION   . HYPERLIPIDEMIA   . GERD   . HYPOTHYROIDISM   .  Hyperparathyroidism     2 lobes removed  . Venous insufficiency     chronic BLE edema  . Spinal stenosis   . Anemia     a. Macrocytic - normal B12, folate 08/2011. b. 3/6 heme positive stools 10/2011 - per PCP note, elected against colonscopy.  [name] TIA (transient ischemic attack) 05/2011  . Anxiety     Past Surgical History  Procedure Laterality Date  . Pacemaker placement  2005    SSS and syncope  . Cholecystectomy  04/06/99  . Abdominal hysterectomy  1970    Partial  . Left hip arthroscopy  1995  . Right hip arthroscopy  01/2001  . Carpal tunnel release  1998    bilateral  . Breast surgery  12/2006    Left breast lupectomy- Ductal CA  . Left parathyroidectomy  07/2008  . Esophagogastroduodenoscopy N/A 10/18/2012    Procedure: ESOPHAGOGASTRODUODENOSCOPY (EGD);  Surgeon: Ladene Artist, MD;  Location: [facility];  Service: Endoscopy;  Laterality: N/A;  . Esophagogastroduodenoscopy (egd) with propofol N/A 01/29/2015    Procedure: ESOPHAGOGASTRODUODENOSCOPY (EGD) WITH PROPOFOL;  Surgeon: Inda Castle, MD;  Location: [facility];  Service: Endoscopy;  Laterality: N/A;  . Ercp N/A 01/29/2015    Procedure: ENDOSCOPIC RETROGRADE CHOLANGIOPANCREATOGRAPHY (ERCP);  Surgeon: Inda Castle, MD;  Location: [facility];  Service: Endoscopy;  Laterality: N/A;     Current Outpatient Prescriptions  Medication Sig Dispense Refill  . apixaban (ELIQUIS) 2.5 MG TABS tablet Take  1 tablet (2.5 mg total) by mouth 2 (two) times daily. 60 tablet 11  . Artificial Tear Ointment (ARTIFICIAL TEARS) ointment Place 1 drop into both eyes 3 (three) times daily.    [name] atorvastatin (LIPITOR) 10 MG tablet Take 10 mg by mouth at bedtime.    . calcium citrate-vitamin D (CITRACAL+D) 315-200 MG-UNIT per tablet Take 1 tablet by mouth 2 (two) times daily.    . cholecalciferol (VITAMIN D) 1000 UNITS tablet Take 1,000 Units by mouth daily.    . citalopram (CELEXA) 20 MG tablet Take 20 mg by mouth daily.    . fentaNYL  (DURAGESIC - DOSED MCG/HR) 75 MCG/HR Place 1 patch (75 mcg total) onto the skin every 3 (three) days. 10 patch 0  . folic acid (FOLVITE) A999333 MCG tablet Take 400 mcg by mouth every morning.    . furosemide (LASIX) 20 MG tablet Take 2 tablets (40 mg total) by mouth daily. Until directed to decrease back to 20 mg daily 180 tablet 1  . gabapentin (NEURONTIN) 400 MG capsule Take 2 capsules (800 mg total) by mouth at bedtime. 180 capsule 1  . HYDROcodone-acetaminophen (NORCO/VICODIN) 5-325 MG tablet Take 1 tablet by mouth every 6 (six) hours as needed for moderate pain. 30 tablet 0  . isosorbide mononitrate (IMDUR) 30 MG 24 hr tablet Take 15 mg by mouth daily.    [name] levothyroxine (SYNTHROID, LEVOTHROID) 50 MCG tablet Take 1 tablet (50 mcg total) by mouth daily. 90 tablet 3  . metolazone (ZAROXOLYN) 2.5 MG tablet If no improvement in edema by Wednesday (03/25/15), start taking one tablet every Monday, Wednesday and Friday for edema.  Take 30 minutes prior to Lasix. 15 tablet 6  . metoprolol succinate (TOPROL-XL) 50 MG 24 hr tablet Take 1 tablet (50 mg total) by mouth 2 (two) times daily. Take with or immediately following a meal. 60 tablet 11  . Multiple Vitamin (MULTIVITAMIN) LIQD Take 5 mLs by mouth daily.    . nitroGLYCERIN (NITROSTAT) 0.4 MG SL tablet Place 0.4 mg under the tongue every 5 (five) minutes x 3 doses as needed for chest pain.    [name] ondansetron (ZOFRAN) 4 MG tablet Take 1 tablet (4 mg total) by mouth every 12 (twelve) hours as needed for nausea. For nausea 30 tablet 1  . pantoprazole (PROTONIX) 40 MG tablet Take 1 tablet (40 mg total) by mouth 2 (two) times daily. 60 tablet 3  . potassium chloride (K-DUR) 10 MEQ tablet Take 1 tablet (10 mEq total) by mouth daily. 30 tablet 3  . predniSONE (DELTASONE) 5 MG tablet Take 5 mg by mouth daily with breakfast.    . sucralfate (CARAFATE) 1 GM/10ML suspension Take 1 g by mouth 2 (two) times daily.    . vitamin B-12 (CYANOCOBALAMIN) 500 MCG tablet TAKE  ONE TABLET BY MOUTH ONCE DAILY. 30 tablet 11   No current facility-administered medications for this visit.    Allergies:   Sulfa antibiotics and Tramadol hcl    Social History:  The patient  reports that she has never smoked. She has never used smokeless tobacco. She reports that she does not drink alcohol or use illicit drugs.   Family History:  The patient's    family history includes Coronary artery disease in her brother and sister; Hypertension in her mother; Lung cancer in her father; Ovarian cancer in her mother.    ROS:  Please see the history of present illness.   Otherwise, review of systems are positive for none.  All other systems are reviewed and negative.    PHYSICAL EXAM: VS:  BP 130/68 mmHg  Pulse 80  Ht 5\' 1"  (1.549 m)  Wt 134 lb (60.782 kg)  BMI 25.33 kg/m2 , BMI Body mass index is 25.33 kg/(m^2). GEN: Well nourished, well developed, in no acute distress Neck: no JVD, HJR, carotid bruits, or masses Cardiac:  RRR; positive S4, 1/6 systolic murmur at the left sternal border, no rubs, thrill or heave,  Respiratory:  clear to auscultation bilaterally, normal work of breathing GI: soft, nontender, nondistended, + BS MS: no deformity or atrophy Extremities: +2 edema bilaterally without cyanosis, clubbing, good distal pulses bilaterally.  Skin: warm and dry, no rash Neuro:  Strength and sensation are intact    EKG:  EKG is not ordered today.    Recent Labs: 01/27/2015: Magnesium 1.3* 03/18/2015: ALT 19; Hemoglobin 11.9*; Platelets 145.0*; Pro B Natriuretic peptide (BNP) 288.0*; TSH 1.39 04/02/2015: BUN 36*; Creat 1.48*; Potassium 3.2*; Sodium 143    Lipid Panel    Component Value Date/Time   CHOL 139 06/02/2014 1153   TRIG 171.0* 06/02/2014 1153   HDL 70.50 06/02/2014 1153   CHOLHDL 2 06/02/2014 1153   VLDL 34.2 06/02/2014 1153   LDLCALC 34 06/02/2014 1153   LDLDIRECT 131.4 12/28/2009 1013      Wt Readings from Last 3 Encounters:  04/07/15 134 lb  (60.782 kg)  03/23/15 157 lb (71.215 kg)  03/23/15 157 lb (71.215 kg)      Other studies Reviewed: Additional studies/ records that were reviewed today include and review of the records demonstrates:  2-D echo 04/02/15 Study Conclusions  - Left ventricle: The cavity size was normal. Wall thickness was   increased in a pattern of mild LVH. Systolic function was normal.   The estimated ejection fraction was in the range of 55% to 60%.   The study is not technically sufficient to allow evaluation of LV   diastolic function. - Aortic valve: Trileaflet. Sclerosis without stenosis. There was   no regurgitation. - Mitral valve: Calcified annulus. Mildly thickened leaflets .   There was trivial regurgitation. - Left atrium: Moderately dilated at 39 ml/m2. - Right ventricle: The cavity size was normal. Pacer wire or   catheter noted in right ventricle. Systolic function is reduced. - Right atrium: The atrium was mildly dilated. Pacer wire or   catheter noted in right atrium. - Tricuspid valve: There was moderate regurgitation. - Pulmonary arteries: PA peak pressure: 50 mm Hg (S). - Inferior vena cava: The vessel was normal in size. The   respirophasic diameter changes were in the normal range (= 50%),   consistent with normal central venous pressure.  Impressions:  - Compared to a prior echo in 2014, the EF has now normalized.     ASSESSMENT AND PLAN:  Chronic combined systolic and diastolic CHF (congestive heart failure) Patient's 2-D echo actually shows normalized LV function. She still has some edema but is lost 23 pounds. Her creatinine was up to 1.48 on 04/02/15 I suspect that is much higher today when we recheck labs. Stop metolazone. Continue Lasix 40 mg once daily, potassium 20 mEq daily. Compression hose. 2 g sodium diet.  Essential hypertension Blood pressure controlled  Stage III chronic kidney disease Checking renal function today.    Signed, Ermalinda Barrios, PA-C   04/07/2015 1:13 PM    [facility] [address], [place], [place]  [address] Phone: [phone]; Fax: [phone]

## 2015-04-07 NOTE — Assessment & Plan Note (Signed)
Blood pressure controlled. 

## 2015-04-07 NOTE — Assessment & Plan Note (Signed)
Patient's 2-D echo actually shows normalized LV function. She still has some edema but is lost 23 pounds. Her creatinine was up to 1.48 on 04/02/15 I suspect that is much higher today when we recheck labs. Stop metolazone. Continue Lasix 40 mg once daily, potassium 20 mEq daily. Compression hose. 2 g sodium diet.

## 2015-04-07 NOTE — Telephone Encounter (Signed)
Spoke with pts son to inform.  

## 2015-04-07 NOTE — Assessment & Plan Note (Signed)
Checking renal function today. 

## 2015-04-08 ENCOUNTER — Telehealth: Payer: Self-pay

## 2015-04-08 DIAGNOSIS — E876 Hypokalemia: Secondary | ICD-10-CM

## 2015-04-08 NOTE — Telephone Encounter (Signed)
Reviewed results with Nell Range PA (FLEX), and she wants patient to take a total of 60 meq of potassium today and continue with Potassium 20 meq daily. Patient needs to come in for lab work in a week. Patient is aware and will be faxing orders to [facility]. Patient will come in on 04/15/15 for BMET.

## 2015-04-10 ENCOUNTER — Telehealth: Payer: Self-pay | Admitting: Internal Medicine

## 2015-04-14 ENCOUNTER — Telehealth: Payer: Self-pay | Admitting: Internal Medicine

## 2015-04-14 MED ORDER — FENTANYL 75 MCG/HR TD PT72: 75.0000 ug | MEDICATED_PATCH | TRANSDERMAL | Status: DC

## 2015-04-14 NOTE — Telephone Encounter (Signed)
Please advise 

## 2015-04-14 NOTE — Telephone Encounter (Signed)
printed

## 2015-04-14 NOTE — Telephone Encounter (Signed)
[facility] is calling to advise that they need a hard copy of the fentaNYL (DURAGESIC - DOSED MCG/HR) 75 MCG/HR [id number] patches for their records. She states that they will come and pick it up once ready,.

## 2015-04-14 NOTE — Telephone Encounter (Signed)
Spoke with Ronny Bacon with [facility] to inform pts RX is ready for pick up at the front office.

## 2015-04-15 ENCOUNTER — Other Ambulatory Visit: Payer: Medicare Other

## 2015-04-16 ENCOUNTER — Other Ambulatory Visit: Payer: Medicare Other

## 2015-04-16 DIAGNOSIS — E876 Hypokalemia: Secondary | ICD-10-CM

## 2015-04-16 LAB — BASIC METABOLIC PANEL WITH GFR
BUN: 31 mg/dL — ABNORMAL HIGH (ref 7–25)
CO2: 29 mmol/L (ref 20–31)
Calcium: 8.1 mg/dL — ABNORMAL LOW (ref 8.6–10.4)
Chloride: 100 mmol/L (ref 98–110)
Creat: 1.46 mg/dL — ABNORMAL HIGH (ref 0.60–0.88)
Glucose, Bld: 100 mg/dL — ABNORMAL HIGH (ref 65–99)
Potassium: 3.6 mmol/L (ref 3.5–5.3)
Sodium: 141 mmol/L (ref 135–146)

## 2015-04-20 ENCOUNTER — Other Ambulatory Visit: Payer: Self-pay | Admitting: Internal Medicine

## 2015-04-21 ENCOUNTER — Encounter: Payer: Self-pay | Admitting: Cardiovascular Disease

## 2015-04-21 ENCOUNTER — Telehealth: Payer: Self-pay | Admitting: *Deleted

## 2015-04-21 ENCOUNTER — Other Ambulatory Visit: Payer: Self-pay | Admitting: Emergency Medicine

## 2015-04-21 DIAGNOSIS — I509 Heart failure, unspecified: Secondary | ICD-10-CM

## 2015-04-21 DIAGNOSIS — I10 Essential (primary) hypertension: Secondary | ICD-10-CM

## 2015-04-21 NOTE — Telephone Encounter (Signed)
-----   Message from Imogene Burn, PA-C sent at 04/19/2015 10:50 PM EST ----- Kidney function a little better. Repeat bmet in January when she sees Dr. Rayann Heman

## 2015-04-21 NOTE — Telephone Encounter (Signed)
Spoke with pt re: her labs and that her kidney function was a little better and that we need to recheck it when she comes in on 05/06/15 to see Dr. Rayann Heman.  Order has been put into EPIC.  Pt verbalized understanding.

## 2015-04-27 ENCOUNTER — Encounter: Payer: Self-pay | Admitting: Family

## 2015-04-27 ENCOUNTER — Other Ambulatory Visit: Payer: Self-pay | Admitting: Internal Medicine

## 2015-04-29 ENCOUNTER — Other Ambulatory Visit: Payer: Self-pay | Admitting: *Deleted

## 2015-04-29 DIAGNOSIS — I4819 Other persistent atrial fibrillation: Secondary | ICD-10-CM

## 2015-05-01 ENCOUNTER — Telehealth: Payer: Self-pay | Admitting: Physician Assistant

## 2015-05-01 NOTE — Telephone Encounter (Addendum)
Called to confirm pt appt -son states he was called about pt's blood work and when he called was told nurse not available and she would call back-he says he hasn't received a call and he sent an email that went unanswered and he is requesting a call back today. Looks like a rtn call was made and pt took the call and the email was sent to primary care rather than here. Pls call son Arnell Sieving [phone]

## 2015-05-01 NOTE — Telephone Encounter (Signed)
I returned Mr. Nicole Roberts call.  He was made aware that his mother was called back 04/21/15 and was given her lab results and also advised that we would recheck it on her apt with Dr. Rayann Heman 05/06/15 and that she verbalized understanding. He thought he had emailed our office and he was advised that the email was actually sent to [facility] instead of our office and that we never received it.  He apologized and said that it was his fault.  He is understanding that the labs will be repeated 05/06/15.  He had thought that a Sharyn Lull called, but he was advised that she was the PA-C that the pt saw and it was actually me that called.  [name] was fine and said he would see [place] a the f/u appt.

## 2015-05-06 ENCOUNTER — Encounter: Payer: Self-pay | Admitting: Internal Medicine

## 2015-05-06 ENCOUNTER — Ambulatory Visit (INDEPENDENT_AMBULATORY_CARE_PROVIDER_SITE_OTHER): Payer: Medicare Other | Admitting: Internal Medicine

## 2015-05-06 VITALS — BP 128/86 | HR 91 | Ht 61.0 in

## 2015-05-06 DIAGNOSIS — I5032 Chronic diastolic (congestive) heart failure: Secondary | ICD-10-CM

## 2015-05-06 DIAGNOSIS — R001 Bradycardia, unspecified: Secondary | ICD-10-CM

## 2015-05-06 DIAGNOSIS — I482 Chronic atrial fibrillation, unspecified: Secondary | ICD-10-CM

## 2015-05-06 NOTE — Patient Instructions (Signed)
Medication Instructions:  Your physician recommends that you continue on your current medications as directed. Please refer to the Current Medication list given to you today.   Labwork: None ordered   Testing/Procedures: None ordered   Follow-Up: Your physician recommends that you schedule a follow-up appointment in: 2 months in the device clinic and 6 months with Dr Rayann Heman   Any Other Special Instructions Will Be Listed Below (If Applicable).     If you need a refill on your cardiac medications before your next appointment, please call your pharmacy.

## 2015-05-06 NOTE — Progress Notes (Signed)
Electrophysiology Office Note   Date:  05/06/2015   ID:  Nicole Roberts, DOB 1922-05-02, MRN [id number]  PCP:  [name], MD   Primary Electrophysiologist: Thompson Grayer, MD    Chief Complaint  Patient presents with  . Bradycardia  . Unspecified AFIB     History of Present Illness: Nicole Roberts is a 82 y.o. female who presents today for electrophysiology evaluation.    She has recently had several visits for edema/ diastolic dysfunction.  She has diuresed pretty well.  Creatinine is 1.5. Today, she denies symptoms of orthopnea, PND, lower extremity edema, claudication, presyncope, syncope, bleeding, or neurologic sequela. The patient is tolerating medications without difficulties and is otherwise without complaint today.    Past Medical History  Diagnosis Date  . HEARING LOSS   . MITRAL VALVE INSUFF&AORTIC VALVE INSUFF     moderate MR  . PULMONARY HYPERTENSION   . Atrial fibrillation ([place])     permanent  . SICK SINUS SYNDROME     a. Tachybrady syndrome - Guidant PPM 10/2005.  . Edema 03/19/2009    due to venous insufficiency, chronic lymphedema  . URINARY INCONTINENCE   . Rheumatoid arthritis(714.0) dx clarified 2011    On prednisone  . BREAST CANCER 12/2006    ductal ca s/p L lumpectomy  . Diastolic dysfunction   . HYPERTENSION   . HYPERLIPIDEMIA   . GERD   . HYPOTHYROIDISM   . Hyperparathyroidism     2 lobes removed  . Venous insufficiency     chronic BLE edema  . Spinal stenosis   . Anemia     a. Macrocytic - normal B12, folate 08/2011. b. 3/6 heme positive stools 10/2011 - per PCP note, elected against colonscopy.  [name] TIA (transient ischemic attack) 05/2011  . Anxiety    Past Surgical History  Procedure Laterality Date  . Pacemaker placement  2005    SSS and syncope  . Cholecystectomy  04/06/99  . Abdominal hysterectomy  1970    Partial  . Left hip arthroscopy  1995  . Right hip arthroscopy  01/2001  . Carpal tunnel release  1998    bilateral  .  Breast surgery  12/2006    Left breast lupectomy- Ductal CA  . Left parathyroidectomy  07/2008  . Esophagogastroduodenoscopy N/A 10/18/2012    Procedure: ESOPHAGOGASTRODUODENOSCOPY (EGD);  Surgeon: Ladene Artist, MD;  Location: [facility];  Service: Endoscopy;  Laterality: N/A;  . Esophagogastroduodenoscopy (egd) with propofol N/A 01/29/2015    Procedure: ESOPHAGOGASTRODUODENOSCOPY (EGD) WITH PROPOFOL;  Surgeon: Inda Castle, MD;  Location: [facility];  Service: Endoscopy;  Laterality: N/A;  . Ercp N/A 01/29/2015    Procedure: ENDOSCOPIC RETROGRADE CHOLANGIOPANCREATOGRAPHY (ERCP);  Surgeon: Inda Castle, MD;  Location: [facility];  Service: Endoscopy;  Laterality: N/A;     Current Outpatient Prescriptions  Medication Sig Dispense Refill  . apixaban (ELIQUIS) 2.5 MG TABS tablet Take 1 tablet (2.5 mg total) by mouth 2 (two) times daily. 60 tablet 11  . atorvastatin (LIPITOR) 10 MG tablet Take 10 mg by mouth at bedtime.    . calcium citrate-vitamin D (CITRACAL+D) 315-200 MG-UNIT per tablet Take 1 tablet by mouth 2 (two) times daily.    . cholecalciferol (VITAMIN D) 1000 UNITS tablet Take 1,000 Units by mouth daily.    . citalopram (CELEXA) 20 MG tablet Take 20 mg by mouth daily.    . fentaNYL (DURAGESIC - DOSED MCG/HR) 75 MCG/HR Place 1 patch (75 mcg total) onto the skin  every 3 (three) days. 10 patch 0  . folic acid (FOLVITE) A999333 MCG tablet Take 400 mcg by mouth every morning.    . furosemide (LASIX) 20 MG tablet Take 2 tablets (40 mg total) by mouth daily. Until directed to decrease back to 20 mg daily 180 tablet 1  . gabapentin (NEURONTIN) 400 MG capsule Take 2 capsules (800 mg total) by mouth at bedtime. 180 capsule 1  . HYDROcodone-acetaminophen (NORCO/VICODIN) 5-325 MG tablet Take 1 tablet by mouth every 6 (six) hours as needed for moderate pain. 30 tablet 0  . isosorbide mononitrate (IMDUR) 30 MG 24 hr tablet Take 15 mg by mouth daily.    [name] levothyroxine (SYNTHROID, LEVOTHROID)  50 MCG tablet Take 1 tablet (50 mcg total) by mouth daily. 90 tablet 3  . metoprolol succinate (TOPROL-XL) 50 MG 24 hr tablet Take 1 tablet (50 mg total) by mouth 2 (two) times daily. Take with or immediately following a meal. 60 tablet 11  . Multiple Vitamin (DAILY-VITE) TABS TAKE ONE TABLET BY MOUTH ONCE DAILY. 30 tablet 2  . Multiple Vitamin (MULTIVITAMIN) LIQD Take 5 mLs by mouth daily.    . nitroGLYCERIN (NITROSTAT) 0.4 MG SL tablet Place 0.4 mg under the tongue every 5 (five) minutes x 3 doses as needed for chest pain.    [name] ondansetron (ZOFRAN) 4 MG tablet Take 1 tablet (4 mg total) by mouth every 12 (twelve) hours as needed for nausea. For nausea 30 tablet 1  . pantoprazole (PROTONIX) 40 MG tablet Take 1 tablet (40 mg total) by mouth 2 (two) times daily. 60 tablet 3  . potassium chloride 20 MEQ TBCR Take 20 mEq by mouth daily.    . potassium chloride SA (K-DUR,KLOR-CON) 20 MEQ tablet Take 1 tablet (20 mEq total) by mouth daily. Must establish with NEW PCP for additional refills. 90 tablet 0  . predniSONE (DELTASONE) 5 MG tablet Take 5 mg by mouth daily with breakfast.    . sucralfate (CARAFATE) 1 GM/10ML suspension Take 1 g by mouth 2 (two) times daily.    . vitamin B-12 (CYANOCOBALAMIN) 500 MCG tablet TAKE ONE TABLET BY MOUTH ONCE DAILY. 30 tablet 11   No current facility-administered medications for this visit.    Allergies:   Sulfa antibiotics and Tramadol hcl   Social History:  The patient  reports that she has never smoked. She has never used smokeless tobacco. She reports that she does not drink alcohol or use illicit drugs.   Family History:  The patient's family history includes Coronary artery disease in her brother and sister; Hypertension in her mother; Lung cancer in her father; Ovarian cancer in her mother.    ROS:  Please see the history of present illness.   All other systems are reviewed and negative.    PHYSICAL EXAM: VS:  BP 128/86 mmHg  Pulse 91  Ht 5\' 1"  (1.549  m) , BMI There is no weight on file to calculate BMI. GEN: Well nourished, well developed, in no acute distress HEENT: normal Neck: no JVD, carotid bruits, or masses Cardiac: iRRR; no murmurs, rubs, or gallops,no edema  Respiratory:  clear to auscultation bilaterally, normal work of breathing GI: soft, nontender, nondistended, + BS MS: no deformity or atrophy Skin: warm and dry, moderate ecchymosis over R arm Neuro:  Strength and sensation are intact Psych: euthymic mood, full affect   Device interrogation is reviewed today in detail.  See PaceArt for details.   Recent Labs: 01/27/2015: Magnesium 1.3* 03/18/2015: ALT  19; Hemoglobin 11.9*; Platelets 145.0*; Pro B Natriuretic peptide (BNP) 288.0*; TSH 1.39 04/16/2015: BUN 31*; Creat 1.46*; Potassium 3.6; Sodium 141    Lipid Panel     Component Value Date/Time   CHOL 139 06/02/2014 1153   TRIG 171.0* 06/02/2014 1153   HDL 70.50 06/02/2014 1153   CHOLHDL 2 06/02/2014 1153   VLDL 34.2 06/02/2014 1153   LDLCALC 34 06/02/2014 1153   LDLDIRECT 131.4 12/28/2009 1013     Wt Readings from Last 3 Encounters:  04/07/15 134 lb (60.782 kg)  03/23/15 157 lb (71.215 kg)  03/23/15 157 lb (71.215 kg)       ASSESSMENT AND PLAN:  1.  Chronic diastolic dysfunction Appears stable at this time Would continue current regimen 2 gram sodium restriction  2. Permanent afib On eliquis  3. Symptomatic bradycardia Normal pacemaker function See Pace Art report No changes today Battery longevity <6 months Return to device clinic in 2 months, then monthly thereafter  4. HTN Stable No change required today  Follow-up with me in 6 months.  IF she reaches ERI prior to this, she can just be scheduled for generator change without an office visit Would hold eliquis for 2 days prior to the procedure  Current medicines are reviewed at length with the patient today.   The patient does not have concerns regarding her medicines.  The following  changes were made today:  none  Labs/ tests ordered today include:  No orders of the defined types were placed in this encounter.     SignedThompson Grayer, MD  05/06/2015 2:25 PM     [address] [place] [phone] (office) [phone] (fax)

## 2015-05-08 LAB — CUP PACEART INCLINIC DEVICE CHECK
Implantable Lead Implant Date: 20070727
Implantable Lead Location: 753859
Implantable Lead Model: 4136
Implantable Lead Serial Number: 24435626
Implantable Lead Serial Number: 24438928
Lead Channel Impedance Value: 580 Ohm
Lead Channel Pacing Threshold Pulse Width: 0.8 ms
Lead Channel Sensing Intrinsic Amplitude: 8.7 mV
Lead Channel Setting Pacing Amplitude: 2.4 V
MDC IDC LEAD IMPLANT DT: 20070727
MDC IDC LEAD LOCATION: 753860
MDC IDC LEAD MODEL: 4135
MDC IDC MSMT LEADCHNL RV PACING THRESHOLD AMPLITUDE: 0.3 V
MDC IDC PG SERIAL: 785093
MDC IDC SESS DTM: 20170104050000
MDC IDC SET LEADCHNL RV PACING PULSEWIDTH: 0.8 ms
MDC IDC SET LEADCHNL RV SENSING SENSITIVITY: 2.5 mV
MDC IDC STAT BRADY RA PERCENT PACED: 0 %
MDC IDC STAT BRADY RV PERCENT PACED: 40 %
Pulse Gen Model: 1290

## 2015-05-11 ENCOUNTER — Telehealth: Payer: Self-pay | Admitting: Internal Medicine

## 2015-05-11 MED ORDER — FENTANYL 75 MCG/HR TD PT72: 75.0000 ug | MEDICATED_PATCH | TRANSDERMAL | Status: DC

## 2015-05-11 NOTE — Telephone Encounter (Signed)
Spoke with Mariann Laster from [facility] to inform RX is ready for pick up

## 2015-05-11 NOTE — Telephone Encounter (Signed)
Need hardscript for fentanyl patch.  Call back for office to pick up.

## 2015-05-11 NOTE — Telephone Encounter (Signed)
printed

## 2015-05-11 NOTE — Telephone Encounter (Signed)
Please advise. Last was ordered on 04/14/15

## 2015-05-15 ENCOUNTER — Encounter: Payer: Self-pay | Admitting: Internal Medicine

## 2015-06-08 ENCOUNTER — Ambulatory Visit (INDEPENDENT_AMBULATORY_CARE_PROVIDER_SITE_OTHER): Payer: Medicare Other | Admitting: Internal Medicine

## 2015-06-08 ENCOUNTER — Other Ambulatory Visit (INDEPENDENT_AMBULATORY_CARE_PROVIDER_SITE_OTHER): Payer: Medicare Other

## 2015-06-08 ENCOUNTER — Ambulatory Visit: Payer: Medicare Other | Admitting: Internal Medicine

## 2015-06-08 ENCOUNTER — Encounter: Payer: Self-pay | Admitting: Internal Medicine

## 2015-06-08 VITALS — BP 124/80 | HR 86 | Temp 98.1°F | Resp 16 | Wt 165.0 lb

## 2015-06-08 DIAGNOSIS — N183 Chronic kidney disease, stage 3 unspecified: Secondary | ICD-10-CM

## 2015-06-08 DIAGNOSIS — E038 Other specified hypothyroidism: Secondary | ICD-10-CM | POA: Diagnosis not present

## 2015-06-08 DIAGNOSIS — E034 Atrophy of thyroid (acquired): Secondary | ICD-10-CM

## 2015-06-08 DIAGNOSIS — I10 Essential (primary) hypertension: Secondary | ICD-10-CM

## 2015-06-08 DIAGNOSIS — E876 Hypokalemia: Secondary | ICD-10-CM

## 2015-06-08 DIAGNOSIS — K219 Gastro-esophageal reflux disease without esophagitis: Secondary | ICD-10-CM

## 2015-06-08 LAB — TSH: TSH: 1.44 u[IU]/mL (ref 0.35–4.50)

## 2015-06-08 LAB — COMPREHENSIVE METABOLIC PANEL
ALT: 24 U/L (ref 0–35)
AST: 18 U/L (ref 0–37)
Albumin: 3.6 g/dL (ref 3.5–5.2)
Alkaline Phosphatase: 54 U/L (ref 39–117)
BUN: 25 mg/dL — AB (ref 6–23)
CHLORIDE: 100 meq/L (ref 96–112)
CO2: 32 mEq/L (ref 19–32)
CREATININE: 1.35 mg/dL — AB (ref 0.40–1.20)
Calcium: 9.1 mg/dL (ref 8.4–10.5)
GFR: 39.04 mL/min — ABNORMAL LOW (ref >60.00)
GLUCOSE: 107 mg/dL — AB (ref 70–99)
POTASSIUM: 3.8 meq/L (ref 3.5–5.1)
SODIUM: 141 meq/L (ref 135–145)
Total Bilirubin: 0.9 mg/dL (ref 0.2–1.2)
Total Protein: 6.2 g/dL (ref 6.0–8.3)

## 2015-06-08 LAB — MAGNESIUM: Magnesium: 1.3 mg/dL — ABNORMAL LOW (ref 1.5–2.5)

## 2015-06-08 NOTE — Progress Notes (Signed)
Subjective:    Patient ID: Nicole Roberts, female    DOB: [birth year], 82 y.o.   MRN: [id number]  HPI She is here to establish and for follow up.   Afib, permanent:  She is following with cardiology.  She is on eliquis.  She denies palpitations, chest pain and sob.   Chronic diastolic dysfunction, LE edema, bradycardia with PPM:  She wears her compresion stockings daily.  She takes the lasix daily. She feels her leg and feet swelling is stable over the past 6 weeks.  She denies sob, but is very sedentary.  She denies chest pain, palpitations and lightheadedness.   Hypertension: She is taking her medication daily. She is compliant with a low sodium diet.  She denies chest pain, palpitations, shortness of breath and regular headaches. She is not exercising regularly and is very sedentary.  She does not monitor her blood pressure at home.    GERD:  She is taking her medication daily as prescribed.  She denies any GERD symptoms and feels her GERD is well controlled.   Depression: She is taking her medication daily as prescribed. She denies any side effects from the medication. She feels her depression is well controlled and she is happy with her current dose of medication.   Hypothyroidism:  She is taking her medication daily.  She feels like her hair has been coming out more than usual.     Medications and allergies reviewed with patient and updated if appropriate.  Patient Active Problem List   Diagnosis Date Noted  . Chronic congestive heart failure with left ventricular diastolic dysfunction ([place]) 05/06/2015  . Symptomatic bradycardia 05/06/2015  . Acute gallstone pancreatitis 02/05/2015  . Gastritis and gastroduodenitis 01/30/2015  . Possible Acute cholangitis 01/30/2015  . Choledocholithiasis 01/29/2015  . Edema leg 01/27/2015  . Diarrhea   . Duodenitis 01/26/2015  . Sepsis due to Escherichia coli (E. coli) ([place]) 01/25/2015  . Thrombocytopenia ([place]) 01/25/2015  .  Protein-calorie malnutrition, moderate ([place]) 01/25/2015  . Wheelchair bound 01/25/2015  . Acute encephalopathy 01/25/2015  . Recurrent cold sores 01/25/2015  . Acute pancreatitis 01/23/2015  . Abdominal pain, epigastric 01/23/2015  . Hypokalemia 01/23/2015  . Leukocytosis 01/23/2015  . Neuropathy ([place]) 01/23/2015  . Chronic combined systolic and diastolic CHF (congestive heart failure) ([place]) 01/23/2015  . Depression   . Stage III chronic kidney disease 04/06/2012  . Atrial fibrillation ([place]) 02/05/2012  . Rheumatoid arthritis ([place]) 01/26/2010  . Hypothyroidism 05/21/2009  . Dyslipidemia 05/21/2009  . Essential hypertension 05/21/2009  . GERD 05/21/2009    Current Outpatient Prescriptions on File Prior to Visit  Medication Sig Dispense Refill  . apixaban (ELIQUIS) 2.5 MG TABS tablet Take 1 tablet (2.5 mg total) by mouth 2 (two) times daily. 60 tablet 11  . atorvastatin (LIPITOR) 10 MG tablet Take 10 mg by mouth at bedtime.    . calcium citrate-vitamin D (CITRACAL+D) 315-200 MG-UNIT per tablet Take 1 tablet by mouth 2 (two) times daily.    . cholecalciferol (VITAMIN D) 1000 UNITS tablet Take 1,000 Units by mouth daily.    . citalopram (CELEXA) 20 MG tablet Take 20 mg by mouth daily.    . fentaNYL (DURAGESIC - DOSED MCG/HR) 75 MCG/HR Place 1 patch (75 mcg total) onto the skin every 3 (three) days. 10 patch 0  . folic acid (FOLVITE) A999333 MCG tablet Take 400 mcg by mouth every morning.    . furosemide (LASIX) 20 MG tablet Take 2 tablets (40 mg total) by  mouth daily. Until directed to decrease back to 20 mg daily 180 tablet 1  . gabapentin (NEURONTIN) 400 MG capsule Take 2 capsules (800 mg total) by mouth at bedtime. 180 capsule 1  . isosorbide mononitrate (IMDUR) 30 MG 24 hr tablet Take 15 mg by mouth daily.    [name] levothyroxine (SYNTHROID, LEVOTHROID) 50 MCG tablet Take 1 tablet (50 mcg total) by mouth daily. 90 tablet 3  . metoprolol succinate (TOPROL-XL) 50 MG 24 hr tablet Take 1 tablet  (50 mg total) by mouth 2 (two) times daily. Take with or immediately following a meal. 60 tablet 11  . Multiple Vitamin (DAILY-VITE) TABS TAKE ONE TABLET BY MOUTH ONCE DAILY. 30 tablet 2  . Multiple Vitamin (MULTIVITAMIN) LIQD Take 5 mLs by mouth daily.    . nitroGLYCERIN (NITROSTAT) 0.4 MG SL tablet Place 0.4 mg under the tongue every 5 (five) minutes x 3 doses as needed for chest pain.    [name] ondansetron (ZOFRAN) 4 MG tablet Take 1 tablet (4 mg total) by mouth every 12 (twelve) hours as needed for nausea. For nausea 30 tablet 1  . pantoprazole (PROTONIX) 40 MG tablet Take 1 tablet (40 mg total) by mouth 2 (two) times daily. 60 tablet 3  . potassium chloride 20 MEQ TBCR Take 20 mEq by mouth daily.    . potassium chloride SA (K-DUR,KLOR-CON) 20 MEQ tablet Take 1 tablet (20 mEq total) by mouth daily. Must establish with NEW PCP for additional refills. 90 tablet 0  . predniSONE (DELTASONE) 5 MG tablet Take 5 mg by mouth daily with breakfast.    . sucralfate (CARAFATE) 1 GM/10ML suspension Take 1 g by mouth 2 (two) times daily.    . vitamin B-12 (CYANOCOBALAMIN) 500 MCG tablet TAKE ONE TABLET BY MOUTH ONCE DAILY. 30 tablet 11   No current facility-administered medications on file prior to visit.    Past Medical History  Diagnosis Date  . HEARING LOSS   . MITRAL VALVE INSUFF&AORTIC VALVE INSUFF     moderate MR  . PULMONARY HYPERTENSION   . Atrial fibrillation ([place])     permanent  . SICK SINUS SYNDROME     a. Tachybrady syndrome - Guidant PPM 10/2005.  . Edema 03/19/2009    due to venous insufficiency, chronic lymphedema  . URINARY INCONTINENCE   . Rheumatoid arthritis(714.0) dx clarified 2011    On prednisone  . BREAST CANCER 12/2006    ductal ca s/p L lumpectomy  . Diastolic dysfunction   . HYPERTENSION   . HYPERLIPIDEMIA   . GERD   . HYPOTHYROIDISM   . Hyperparathyroidism     2 lobes removed  . Venous insufficiency     chronic BLE edema  . Spinal stenosis   . Anemia     a.  Macrocytic - normal B12, folate 08/2011. b. 3/6 heme positive stools 10/2011 - per PCP note, elected against colonscopy.  [name] TIA (transient ischemic attack) 05/2011  . Anxiety     Past Surgical History  Procedure Laterality Date  . Pacemaker placement  2005    SSS and syncope  . Cholecystectomy  04/06/99  . Abdominal hysterectomy  1970    Partial  . Left hip arthroscopy  1995  . Right hip arthroscopy  01/2001  . Carpal tunnel release  1998    bilateral  . Breast surgery  12/2006    Left breast lupectomy- Ductal CA  . Left parathyroidectomy  07/2008  . Esophagogastroduodenoscopy N/A 10/18/2012    Procedure: ESOPHAGOGASTRODUODENOSCOPY (  EGD);  Surgeon: Ladene Artist, MD;  Location: [facility];  Service: Endoscopy;  Laterality: N/A;  . Esophagogastroduodenoscopy (egd) with propofol N/A 01/29/2015    Procedure: ESOPHAGOGASTRODUODENOSCOPY (EGD) WITH PROPOFOL;  Surgeon: Inda Castle, MD;  Location: [facility];  Service: Endoscopy;  Laterality: N/A;  . Ercp N/A 01/29/2015    Procedure: ENDOSCOPIC RETROGRADE CHOLANGIOPANCREATOGRAPHY (ERCP);  Surgeon: Inda Castle, MD;  Location: [facility];  Service: Endoscopy;  Laterality: N/A;    Social History   Social History  . Marital Status: Married    Spouse Name: N/A  . Number of Children: 6  . Years of Education: N/A   Occupational History  . Retired Agricultural engineer    Social History Main Topics  . Smoking status: Never Smoker   . Smokeless tobacco: Never Used  . Alcohol Use: No  . Drug Use: No  . Sexual Activity: Not Currently   Other Topics Concern  . None   Social History Narrative   Lives at [facility] since 03/2009- married, lives with spouse. Moved here from [place] to be near son    Family History  Problem Relation Age of Onset  . Ovarian cancer Mother   . Coronary artery disease Sister   . Coronary artery disease Brother   . Hypertension Mother     grandparent  . Lung cancer Father     Review of  Systems  Constitutional: Negative for fever and appetite change.  Respiratory: Negative for cough, shortness of breath (very sedentary) and wheezing.   Cardiovascular: Positive for leg swelling. Negative for chest pain and palpitations.  Gastrointestinal: Negative for abdominal pain.  Neurological: Negative for light-headedness and headaches.       Objective:   Filed Vitals:   06/08/15 1324  BP: 124/80  Pulse: 86  Temp: 98.1 F (36.7 C)  Resp: 16   Filed Weights   06/08/15 1324  Weight: 165 lb (74.844 kg)   Body mass index is 31.19 kg/(m^2).   Physical Exam Constitutional: Appears well-developed and well-nourished. No distress.  Neck: Neck supple. No tracheal deviation present. No thyromegaly present.  No carotid bruit. No cervical adenopathy.   Cardiovascular: Normal rate, regular rhythm and normal heart sounds.   1/6 systolic murmur.  2 + edema - wearing compression stockings Pulmonary/Chest: Effort normal and breath sounds normal. No respiratory distress. No wheezes.           Assessment & Plan:   See Problem List for Assessment and Plan of chronic medical problems.  Follow up in 6 months sooner if needed

## 2015-06-08 NOTE — Assessment & Plan Note (Signed)
Check tsh  Titrate med dose if needed  

## 2015-06-08 NOTE — Assessment & Plan Note (Signed)
Check cmp Will increase dose if needed

## 2015-06-08 NOTE — Assessment & Plan Note (Signed)
BP Readings from Last 3 Encounters:  06/08/15 124/80  05/06/15 128/86  04/07/15 130/68   Controlled Continue current medications

## 2015-06-08 NOTE — Patient Instructions (Signed)
  We have reviewed your prior records including labs and tests today.  Test(s) ordered today. Your results will be released to [place] (or called to you) after review, usually within 72hours after test completion. If any changes need to be made, you will be notified at that same time.   Medications reviewed and updated.  No changes recommended at this time.   Please schedule followup in 6 months

## 2015-06-08 NOTE — Progress Notes (Signed)
Pre visit review using our clinic review tool, if applicable. No additional management support is needed unless otherwise documented below in the visit note. 

## 2015-06-08 NOTE — Assessment & Plan Note (Signed)
Controlled.   -Continue current medication

## 2015-06-08 NOTE — Assessment & Plan Note (Signed)
Check cmp, mag today Increase water intake a little - she drinks little water throughout the day

## 2015-06-10 ENCOUNTER — Telehealth: Payer: Self-pay | Admitting: Emergency Medicine

## 2015-06-10 ENCOUNTER — Other Ambulatory Visit: Payer: Self-pay | Admitting: Internal Medicine

## 2015-06-10 MED ORDER — MAGNESIUM 400 MG PO TABS: 1.0000 | ORAL_TABLET | Freq: Every day | ORAL | Status: DC

## 2015-06-10 NOTE — Telephone Encounter (Signed)
Paper work for [facility] surgery has been mailed to Pt and office.

## 2015-06-12 ENCOUNTER — Encounter: Payer: Self-pay | Admitting: Emergency Medicine

## 2015-06-17 ENCOUNTER — Telehealth: Payer: Self-pay | Admitting: Internal Medicine

## 2015-06-17 MED ORDER — FENTANYL 75 MCG/HR TD PT72: 75.0000 ug | MEDICATED_PATCH | TRANSDERMAL | Status: DC

## 2015-06-17 NOTE — Telephone Encounter (Signed)
Spoke with [facility], they were not able to take verbal orders for [facility] for skin tear, they are able to take a signed order from another provider. Informed that we may only be able to give enough Fentanyl Patches for this week until Dr Quay Burow returns.

## 2015-06-17 NOTE — Telephone Encounter (Signed)
Patches printed per [employer]. [facility] has been contacted and informed that RX is ready for pick up

## 2015-06-17 NOTE — Addendum Note (Signed)
Addended by: Terence Lux B on: 06/17/2015 04:44 PM   Modules accepted: Orders

## 2015-06-17 NOTE — Telephone Encounter (Signed)
[name] from [facility] called stating she faxed a request for a refill for pts fentaNYL ([place] - DOSED MCG/HR) 75 MCG/HR [id number]  She only has 1 patch left  [name] can be reached at [phone]

## 2015-06-22 ENCOUNTER — Encounter: Payer: Self-pay | Admitting: Podiatry

## 2015-06-22 ENCOUNTER — Ambulatory Visit: Payer: Medicare Other

## 2015-06-22 ENCOUNTER — Ambulatory Visit (INDEPENDENT_AMBULATORY_CARE_PROVIDER_SITE_OTHER): Payer: Medicare Other | Admitting: Podiatry

## 2015-06-22 DIAGNOSIS — S92912D Unspecified fracture of left toe(s), subsequent encounter for fracture with routine healing: Secondary | ICD-10-CM

## 2015-06-22 DIAGNOSIS — B351 Tinea unguium: Secondary | ICD-10-CM | POA: Diagnosis not present

## 2015-06-22 DIAGNOSIS — M79676 Pain in unspecified toe(s): Secondary | ICD-10-CM | POA: Diagnosis not present

## 2015-06-22 NOTE — Progress Notes (Signed)
Patient ID: Nicole Roberts, female   DOB: February 03, 1922, 82 y.o.   MRN: [id number]  Subjective: 82 y.o. returns the office today for painful, elongated, thickened toenails which she is unable to trim herself. Denies any redness or drainage around the nails. She also has a history of a left 5th toe fracture. Denies any pain to the area.  Denies any acute changes since last appointment and no new complaints today. Denies any systemic complaints such as fevers, chills, nausea, vomiting.   Objective: AAO 3, NAD DP/PT pulses palpable 1/4, CRT less than 3 seconds Protective sensation decrased with Simms Weinstein monofilament Nails hypertrophic, dystrophic, elongated, brittle, discolored 10. There is tenderness overlying the nails 1-5 bilaterally. There is no surrounding erythema or drainage along the nail sites. No open lesions or pre-ulcerative lesions are identified. There is no tenderness to palpation over the left fifth toe. There is no edema overlying this area today. No other areas of tenderness bilateral lower extremities. No overlying edema, erythema, increased warmth. There is apparently increased swelling to both her legs with mild pain. There is no erythema increase in warmth.  Assessment: Patient presents with symptomatic onychomycosis; left 5th toe fracture  Plan: -Treatment options including alternatives, risks, complications were discussed -Nails sharply debrided 10 without complication/bleeding. -X-rays were obtained and reviewed with the patient. There is increased consolidation across the fracture of the 4th and 5th toes.  -Discussed daily foot inspection. If there are any changes, to call the office immediately.  -Follow-up in 3 months or sooner if any problems are to arise. In the meantime, encouraged to call the office with any questions, concerns, changes symptoms.  Celesta Gentile, DPM

## 2015-06-23 ENCOUNTER — Telehealth: Payer: Self-pay | Admitting: Emergency Medicine

## 2015-06-23 MED ORDER — FENTANYL 75 MCG/HR TD PT72: 75.0000 ug | MEDICATED_PATCH | TRANSDERMAL | Status: DC

## 2015-06-23 NOTE — Telephone Encounter (Signed)
rx printed

## 2015-06-23 NOTE — Telephone Encounter (Signed)
[facility] called to request refill on Fentanyl patches. Pt was given 3 patched by Terri Piedra, NP to get her until you returned from vacation. Please advise, pt usually gets 10 patches at a time.

## 2015-06-24 NOTE — Telephone Encounter (Signed)
Called [facility] to inform RX was ready for pick up

## 2015-06-26 ENCOUNTER — Other Ambulatory Visit: Payer: Self-pay | Admitting: Internal Medicine

## 2015-07-10 ENCOUNTER — Telehealth: Payer: Self-pay

## 2015-07-10 DIAGNOSIS — I130 Hypertensive heart and chronic kidney disease with heart failure and stage 1 through stage 4 chronic kidney disease, or unspecified chronic kidney disease: Secondary | ICD-10-CM | POA: Diagnosis not present

## 2015-07-10 NOTE — Telephone Encounter (Signed)
Home Health Cert/Plan of Care received (06/19/2015 - 08/17/2015) and placed on MD's desk for signature

## 2015-07-13 NOTE — Telephone Encounter (Signed)
Paperwork signed, faxed, copy sent to scan 

## 2015-07-15 ENCOUNTER — Telehealth: Payer: Self-pay | Admitting: *Deleted

## 2015-07-15 MED ORDER — FENTANYL 75 MCG/HR TD PT72: 75.0000 ug | MEDICATED_PATCH | TRANSDERMAL | Status: DC

## 2015-07-15 NOTE — Telephone Encounter (Signed)
Received fax stating pt needing refill on her Fentanyl Patches 75 mg apply 1 patch every 3 days. Pls call when ready for pick-up...Johny Chess

## 2015-07-15 NOTE — Telephone Encounter (Signed)
Notified [facility] rx ready for pick-up...Johny Chess

## 2015-07-15 NOTE — Telephone Encounter (Signed)
printed

## 2015-07-17 ENCOUNTER — Other Ambulatory Visit: Payer: Self-pay | Admitting: Internal Medicine

## 2015-07-20 ENCOUNTER — Ambulatory Visit (INDEPENDENT_AMBULATORY_CARE_PROVIDER_SITE_OTHER): Payer: Medicare Other | Admitting: *Deleted

## 2015-07-20 ENCOUNTER — Encounter: Payer: Self-pay | Admitting: Internal Medicine

## 2015-07-20 DIAGNOSIS — I482 Chronic atrial fibrillation, unspecified: Secondary | ICD-10-CM

## 2015-07-20 LAB — CUP PACEART INCLINIC DEVICE CHECK
Battery Remaining Longevity: 6 mo
Date Time Interrogation Session: 20170320040000
Implantable Lead Implant Date: 20070727
Implantable Lead Location: 753859
Implantable Lead Model: 4135
Lead Channel Setting Pacing Amplitude: 2.4 V
Lead Channel Setting Pacing Pulse Width: 0.8 ms
MDC IDC LEAD IMPLANT DT: 20070727
MDC IDC LEAD LOCATION: 753860
MDC IDC LEAD MODEL: 4136
MDC IDC LEAD SERIAL: 24435626
MDC IDC LEAD SERIAL: 24438928
MDC IDC SET LEADCHNL RV SENSING SENSITIVITY: 2.5 mV
MDC IDC STAT BRADY RA PERCENT PACED: 0 %
MDC IDC STAT BRADY RV PERCENT PACED: 40 %
Pulse Gen Serial Number: 785093

## 2015-07-20 NOTE — Progress Notes (Signed)
Pacemaker check in clinic for battery only (N/C). Estimated longevity <0.5 years. 1 VHR episode recorded x 35 bts. Patient to f/u with the device clinic in 2 months (billable) per son request and with JA in 10-2015.

## 2015-08-10 ENCOUNTER — Ambulatory Visit (INDEPENDENT_AMBULATORY_CARE_PROVIDER_SITE_OTHER): Payer: Medicare Other | Admitting: Family

## 2015-08-10 ENCOUNTER — Encounter: Payer: Self-pay | Admitting: Family

## 2015-08-10 VITALS — BP 140/78 | HR 79 | Temp 98.1°F | Resp 14 | Ht 61.0 in | Wt 154.8 lb

## 2015-08-10 DIAGNOSIS — B001 Herpesviral vesicular dermatitis: Secondary | ICD-10-CM

## 2015-08-10 MED ORDER — MAGIC MOUTHWASH: ORAL | Status: DC

## 2015-08-10 NOTE — Assessment & Plan Note (Signed)
Symptoms and exam consistent with canker sores with no evidence of cold sore on lips. Start magic mouthwash with equal parts lidocaine, benedryl and Maalox. Follow up if symptoms worsen or do not improve.

## 2015-08-10 NOTE — Progress Notes (Signed)
Pre visit review using our clinic review tool, if applicable. No additional management support is needed unless otherwise documented below in the visit note. 

## 2015-08-10 NOTE — Progress Notes (Signed)
Subjective:    Patient ID: Nicole Roberts, female    DOB: May 10, 1921, 82 y.o.   MRN: [id number]  Chief Complaint  Patient presents with  . Mouth Lesions    x1 week has had mouth sores in lips and at the roof of her mouth    HPI:  Nicole Roberts is a 82 y.o. female who  has a past medical history of HEARING LOSS; MITRAL VALVE INSUFF&AORTIC VALVE INSUFF; PULMONARY HYPERTENSION; Atrial fibrillation ([place]); SICK SINUS SYNDROME; Edema (03/19/2009); URINARY INCONTINENCE; Rheumatoid arthritis(714.0) (dx clarified 2011); BREAST CANCER (12/2006); Diastolic dysfunction; HYPERTENSION; HYPERLIPIDEMIA; GERD; HYPOTHYROIDISM; Hyperparathyroidism; Venous insufficiency; Spinal stenosis; Anemia; TIA (transient ischemic attack) (05/2011); and Anxiety. and presents today for an acute office visit.   Associated symptom of sores located on her mouth and lips have been going on for about 2-3 week and have worsened over the past weekend. Modifying factors include orajel and yogurt. Does have history of recurrent cold sores. No fevers. No recent antibiotic use.   Allergies  Allergen Reactions  . Sulfa Antibiotics Itching  . Tramadol Hcl Itching and Rash     Current Outpatient Prescriptions on File Prior to Visit  Medication Sig Dispense Refill  . apixaban (ELIQUIS) 2.5 MG TABS tablet Take 1 tablet (2.5 mg total) by mouth 2 (two) times daily. 60 tablet 11  . atorvastatin (LIPITOR) 10 MG tablet Take 10 mg by mouth at bedtime.    . calcium citrate-vitamin D (CITRACAL+D) 315-200 MG-UNIT per tablet Take 1 tablet by mouth 2 (two) times daily.    . cholecalciferol (VITAMIN D) 1000 UNITS tablet Take 1,000 Units by mouth daily.    . citalopram (CELEXA) 20 MG tablet Take 20 mg by mouth daily.    . fentaNYL (DURAGESIC - DOSED MCG/HR) 75 MCG/HR Place 1 patch (75 mcg total) onto the skin every 3 (three) days. 10 patch 0  . folic acid (FOLVITE) A999333 MCG tablet Take 400 mcg by mouth every morning.    . furosemide (LASIX) 20  MG tablet Take 2 tablets (40 mg total) by mouth daily. Until directed to decrease back to 20 mg daily 180 tablet 1  . gabapentin (NEURONTIN) 400 MG capsule Take 2 capsules (800 mg total) by mouth at bedtime. 180 capsule 1  . isosorbide mononitrate (IMDUR) 30 MG 24 hr tablet Take 15 mg by mouth daily.    [name] levothyroxine (SYNTHROID, LEVOTHROID) 50 MCG tablet Take 1 tablet (50 mcg total) by mouth daily. 90 tablet 3  . Magnesium 400 MG TABS Take 1 tablet by mouth daily. 30 tablet 5  . metoprolol succinate (TOPROL-XL) 50 MG 24 hr tablet Take 1 tablet (50 mg total) by mouth 2 (two) times daily. Take with or immediately following a meal. 60 tablet 11  . Multiple Vitamin (DAILY-VITE) TABS TAKE ONE TABLET BY MOUTH ONCE DAILY. 30 tablet 2  . Multiple Vitamin (MULTIVITAMIN) LIQD Take 5 mLs by mouth daily.    . nitroGLYCERIN (NITROSTAT) 0.4 MG SL tablet Place 0.4 mg under the tongue every 5 (five) minutes x 3 doses as needed for chest pain.    [name] ondansetron (ZOFRAN) 4 MG tablet Take 1 tablet (4 mg total) by mouth every 12 (twelve) hours as needed for nausea. For nausea 30 tablet 1  . pantoprazole (PROTONIX) 40 MG tablet Take 1 tablet (40 mg total) by mouth 2 (two) times daily. 60 tablet 3  . potassium chloride SA (K-DUR,KLOR-CON) 20 MEQ tablet TAKE ONE TABLET BY MOUTH ONCE DAILY. 30 tablet 5  .  predniSONE (DELTASONE) 5 MG tablet Take 5 mg by mouth daily with breakfast.    . sucralfate (CARAFATE) 1 GM/10ML suspension Take 1 g by mouth 2 (two) times daily.    . vitamin B-12 (CYANOCOBALAMIN) 500 MCG tablet TAKE ONE TABLET BY MOUTH ONCE DAILY. 30 tablet 11   No current facility-administered medications on file prior to visit.    Review of Systems  Constitutional: Negative for fever and chills.  HENT:       Positive for mouth pain and sores.       Objective:    BP 140/78 mmHg  Pulse 79  Temp(Src) 98.1 F (36.7 C) (Oral)  Resp 14  Ht 5\' 1"  (1.549 m)  Wt 154 lb 12.8 oz (70.217 kg)  BMI 29.26 kg/m2   SpO2 94% Nursing note and vital signs reviewed.  Physical Exam  Constitutional: She is oriented to person, place, and time. She appears well-developed and well-nourished. No distress.  HENT:  Several small areas of canker sores noted in various stages. No evidence of inflammation.   Cardiovascular: Normal rate, regular rhythm, normal heart sounds and intact distal pulses.   Pulmonary/Chest: Effort normal and breath sounds normal.  Neurological: She is alert and oriented to person, place, and time.  Skin: Skin is warm and dry.  Psychiatric: She has a normal mood and affect. Her behavior is normal. Judgment and thought content normal.       Assessment & Plan:   Problem List Items Addressed This Visit      Digestive   Recurrent cold sores - Primary    Symptoms and exam consistent with canker sores with no evidence of cold sore on lips. Start magic mouthwash with equal parts lidocaine, benedryl and Maalox. Follow up if symptoms worsen or do not improve.       Relevant Medications   magic mouthwash SOLN       I have discontinued Ms. Budhram Potassium Chloride ER. I am also having her start on magic mouthwash. Additionally, I am having her maintain her nitroGLYCERIN, ondansetron, atorvastatin, sucralfate, citalopram, multivitamin, folic acid, isosorbide mononitrate, cholecalciferol, predniSONE, vitamin B-12, metoprolol succinate, levothyroxine, gabapentin, calcium citrate-vitamin D, apixaban, pantoprazole, furosemide, Magnesium, potassium chloride SA, fentaNYL, and DAILY-VITE.   Meds ordered this encounter  Medications  . magic mouthwash SOLN    Sig: Swish, gargle, and spit one to two teaspoonfuls every six hours as needed. May be swallowed if esophageal involvement.    Dispense:  120 mL    Refill:  1    1 Part viscous lidocaine 2% 1 Part Maalox (do not substitute Kaopectate) 1 Part diphenhydramine 12.5 mg per 5 ml elixir    Order Specific Question:  Supervising Provider     Answer:  Pricilla Holm A [id number]     Follow-up: Return if symptoms worsen or fail to improve.  Mauricio Po, FNP

## 2015-08-10 NOTE — Patient Instructions (Signed)
Thank you for choosing [facility].  Summary/Instructions:  Your prescription(s) have been submitted to your pharmacy or been printed and provided for you. Please take as directed and contact our office if you believe you are having problem(s) with the medication(s) or have any questions.  If your symptoms worsen or fail to improve, please contact our office for further instruction, or in case of emergency go directly to the emergency room at the closest medical facility.   Please continue to take your medications.

## 2015-08-13 ENCOUNTER — Telehealth: Payer: Self-pay | Admitting: Emergency Medicine

## 2015-08-13 MED ORDER — FENTANYL 75 MCG/HR TD PT72: 75.0000 ug | MEDICATED_PATCH | TRANSDERMAL | Status: DC

## 2015-08-17 NOTE — Telephone Encounter (Signed)
RX has been printed. Facility is aware that it is ready for pick-up as of 08/13/15

## 2015-08-18 ENCOUNTER — Telehealth: Payer: Self-pay | Admitting: Internal Medicine

## 2015-08-18 ENCOUNTER — Telehealth: Payer: Self-pay | Admitting: *Deleted

## 2015-08-18 DIAGNOSIS — B001 Herpesviral vesicular dermatitis: Secondary | ICD-10-CM

## 2015-08-18 MED ORDER — MAGIC MOUTHWASH: ORAL | Status: DC

## 2015-08-18 NOTE — Telephone Encounter (Signed)
Left msg on triage stating mother saw Nicole Roberts last week for mouth ulcers. He had rx mouth wash, but mom lives in a assistant living facility and they are not giving her the mouth wash dail;y to use. Wanting to see if Nicole Roberts can re-write rx to say Self medicate four times a day as needed so she can use when she need too...Nicole Roberts

## 2015-08-18 NOTE — Telephone Encounter (Signed)
Medication printed and ready to fax.  

## 2015-08-18 NOTE — Telephone Encounter (Signed)
Spoke with Nicole Roberts to let her know that someone was contacted last week that the RX was ready for pick-up at the front office.

## 2015-08-18 NOTE — Telephone Encounter (Signed)
Nicole Roberts [id number] called from [facility] [place] regarding needing a written Rx for fentaNYL (DURAGESIC - DOSED MCG/HR) 75 MCG/HR. Call when it' ready. Thank you!

## 2015-08-19 MED ORDER — MAGIC MOUTHWASH: ORAL | Status: DC

## 2015-08-19 NOTE — Addendum Note (Signed)
Addended by: Earnstine Regal on: 08/19/2015 12:05 PM   Modules accepted: Orders, Medications

## 2015-08-19 NOTE — Telephone Encounter (Signed)
Notified son greg printed out new rx with new instructions. Since pt has the mouth wash he sated to just fax order to [facility]. Inform son will fax to [phone]...Nicole Roberts

## 2015-08-19 NOTE — Telephone Encounter (Signed)
Received call from [facility] @ [facility] stating that they received script for magic math wash, but need the rx written for set schedule, not as needed. Would like it to say just to self-medicate three times a day.Inform will update script and fax bck....Johny Chess

## 2015-08-24 ENCOUNTER — Telehealth: Payer: Self-pay

## 2015-08-24 MED ORDER — CALCIUM CITRATE-VITAMIN D 315-200 MG-UNIT PO TABS: 1.0000 | ORAL_TABLET | Freq: Two times a day (BID) | ORAL | Status: DC

## 2015-08-24 MED ORDER — PANTOPRAZOLE SODIUM 40 MG PO TBEC: 40.0000 mg | DELAYED_RELEASE_TABLET | Freq: Two times a day (BID) | ORAL | Status: DC

## 2015-08-24 NOTE — Telephone Encounter (Signed)
[facility] in [place] called, needs refills of Protonix and Citracal for patient.  Pharmacy [phone]

## 2015-08-24 NOTE — Telephone Encounter (Signed)
Spoke with pharmacy, stated that the pack the medications for the pt and would use OTC Citracal but still need it sent electronically.

## 2015-08-24 NOTE — Telephone Encounter (Signed)
Okay to send in Citracal?? Or should pt be getting this OTC.

## 2015-08-24 NOTE — Telephone Encounter (Signed)
Error

## 2015-08-24 NOTE — Telephone Encounter (Signed)
Ok to send but I think she needs to get it otc

## 2015-09-08 ENCOUNTER — Other Ambulatory Visit: Payer: Self-pay | Admitting: Family

## 2015-09-14 ENCOUNTER — Ambulatory Visit: Payer: Medicare Other | Admitting: Podiatry

## 2015-09-14 ENCOUNTER — Other Ambulatory Visit: Payer: Self-pay | Admitting: Internal Medicine

## 2015-09-14 MED ORDER — FENTANYL 75 MCG/HR TD PT72: 75.0000 ug | MEDICATED_PATCH | TRANSDERMAL | Status: DC

## 2015-09-16 ENCOUNTER — Telehealth: Payer: Self-pay | Admitting: Emergency Medicine

## 2015-09-16 NOTE — Telephone Encounter (Signed)
Called [facility] to inform Pt has RX for Fentanyl patch ready for pick up

## 2015-09-21 ENCOUNTER — Ambulatory Visit (INDEPENDENT_AMBULATORY_CARE_PROVIDER_SITE_OTHER): Payer: Medicare Other | Admitting: Internal Medicine

## 2015-09-21 ENCOUNTER — Encounter: Payer: Self-pay | Admitting: Internal Medicine

## 2015-09-21 DIAGNOSIS — K121 Other forms of stomatitis: Secondary | ICD-10-CM

## 2015-09-21 DIAGNOSIS — B370 Candidal stomatitis: Secondary | ICD-10-CM | POA: Diagnosis not present

## 2015-09-21 MED ORDER — NYSTATIN 100000 UNIT/ML MT SUSP: 5.0000 mL | Freq: Four times a day (QID) | OROMUCOSAL | Status: DC

## 2015-09-21 NOTE — Progress Notes (Signed)
Subjective:    Patient ID: Nicole Roberts, female    DOB: [birth year], 82 y.o.   MRN: [id number]  HPI She is here for an acute visit. She is here with her son.  She has had symptoms for more than one month.  She has had a sore on the inside of her lower lip.  She was seen here and prescribed Magic mouthwash, which did help her pain, but has not helped with the healing. Her lips are very dry and she has been using Chapstick, which is helping. Her tongue is also very sore, dry and is burning. She has not had any recent antibiotics. She takes 5 mg of prednisone daily, which is not new. She is currently not on any inhalers. She does have some mild soreness in her throat, but denies any true difficulty swallowing. She has tried Orajel has not helped much.  She denies any history of cold sores. She does wear dentures and has been noted by her lips in the past.  Medications and allergies reviewed with patient and updated if appropriate.  Patient Active Problem List   Diagnosis Date Noted  . Chronic congestive heart failure with left ventricular diastolic dysfunction ([place]) 05/06/2015  . Symptomatic bradycardia 05/06/2015  . Acute gallstone pancreatitis 02/05/2015  . Gastritis and gastroduodenitis 01/30/2015  . Possible Acute cholangitis 01/30/2015  . Choledocholithiasis 01/29/2015  . Edema leg 01/27/2015  . Diarrhea   . Duodenitis 01/26/2015  . Sepsis due to Escherichia coli (E. coli) ([place]) 01/25/2015  . Thrombocytopenia ([place]) 01/25/2015  . Protein-calorie malnutrition, moderate ([place]) 01/25/2015  . Wheelchair bound 01/25/2015  . Acute encephalopathy 01/25/2015  . Recurrent cold sores 01/25/2015  . Acute pancreatitis 01/23/2015  . Abdominal pain, epigastric 01/23/2015  . Hypokalemia 01/23/2015  . Leukocytosis 01/23/2015  . Neuropathy ([place]) 01/23/2015  . Chronic combined systolic and diastolic CHF (congestive heart failure) ([place]) 01/23/2015  . Depression   . Stage III chronic kidney  disease 04/06/2012  . Atrial fibrillation ([place]) 02/05/2012  . Rheumatoid arthritis ([place]) 01/26/2010  . Hypothyroidism 05/21/2009  . Dyslipidemia 05/21/2009  . Essential hypertension 05/21/2009  . GERD 05/21/2009    Current Outpatient Prescriptions on File Prior to Visit  Medication Sig Dispense Refill  . apixaban (ELIQUIS) 2.5 MG TABS tablet Take 1 tablet (2.5 mg total) by mouth 2 (two) times daily. 60 tablet 11  . atorvastatin (LIPITOR) 10 MG tablet Take 10 mg by mouth at bedtime.    . calcium citrate-vitamin D (CITRACAL+D) 315-200 MG-UNIT tablet Take 1 tablet by mouth 2 (two) times daily. 180 tablet 3  . cholecalciferol (VITAMIN D) 1000 UNITS tablet Take 1,000 Units by mouth daily.    . citalopram (CELEXA) 20 MG tablet Take 20 mg by mouth daily.    . fentaNYL (DURAGESIC - DOSED MCG/HR) 75 MCG/HR Place 1 patch (75 mcg total) onto the skin every 3 (three) days. 10 patch 0  . folic acid (FOLVITE) A999333 MCG tablet Take 400 mcg by mouth every morning.    . furosemide (LASIX) 20 MG tablet Take 2 tablets (40 mg total) by mouth daily. Until directed to decrease back to 20 mg daily 180 tablet 1  . gabapentin (NEURONTIN) 400 MG capsule Take 2 capsules (800 mg total) by mouth at bedtime. 180 capsule 1  . isosorbide mononitrate (IMDUR) 30 MG 24 hr tablet Take 15 mg by mouth daily.    [name] levothyroxine (SYNTHROID, LEVOTHROID) 50 MCG tablet Take 1 tablet (50 mcg total) by mouth daily. [place]  tablet 3  . magic mouthwash SOLN Swish, gargle, and spit one to two teaspoonfuls three times a day. May be swallowed if esophageal involvement. Patient to self-medicate. 120 mL 1  . Magnesium 400 MG TABS Take 1 tablet by mouth daily. 30 tablet 5  . metoprolol succinate (TOPROL-XL) 50 MG 24 hr tablet Take 1 tablet (50 mg total) by mouth 2 (two) times daily. Take with or immediately following a meal. 60 tablet 11  . Mouthwashes (MOUTHWASH/GARGLE) LIQD SWISH, GARGLE AND SPIT 1 TO 2 TEASPOONSFUL BY MOUTH 3 TIMES DAILY. MAY BE  SWALLOWED IF ESOPHAGEAL INVOLVEMENT. PATIENT MAY SELF MEDICATE. ME 120 mL 0  . Multiple Vitamin (DAILY-VITE) TABS TAKE ONE TABLET BY MOUTH ONCE DAILY. 30 tablet 2  . Multiple Vitamin (MULTIVITAMIN) LIQD Take 5 mLs by mouth daily.    . nitroGLYCERIN (NITROSTAT) 0.4 MG SL tablet Place 0.4 mg under the tongue every 5 (five) minutes x 3 doses as needed for chest pain.    [name] ondansetron (ZOFRAN) 4 MG tablet Take 1 tablet (4 mg total) by mouth every 12 (twelve) hours as needed for nausea. For nausea 30 tablet 1  . pantoprazole (PROTONIX) 40 MG tablet Take 1 tablet (40 mg total) by mouth 2 (two) times daily. 60 tablet 3  . potassium chloride SA (K-DUR,KLOR-CON) 20 MEQ tablet TAKE ONE TABLET BY MOUTH ONCE DAILY. 30 tablet 5  . predniSONE (DELTASONE) 5 MG tablet Take 5 mg by mouth daily with breakfast.    . sucralfate (CARAFATE) 1 GM/10ML suspension Take 1 g by mouth 2 (two) times daily.    . vitamin B-12 (CYANOCOBALAMIN) 500 MCG tablet TAKE ONE TABLET BY MOUTH ONCE DAILY. 30 tablet 11   No current facility-administered medications on file prior to visit.    Past Medical History  Diagnosis Date  . HEARING LOSS   . MITRAL VALVE INSUFF&AORTIC VALVE INSUFF     moderate MR  . PULMONARY HYPERTENSION   . Atrial fibrillation ([place])     permanent  . SICK SINUS SYNDROME     a. Tachybrady syndrome - [facility] PPM 10/2005.  . Edema 03/19/2009    due to venous insufficiency, chronic lymphedema  . URINARY INCONTINENCE   . Rheumatoid arthritis(714.0) dx clarified 2011    On prednisone  . BREAST CANCER 12/2006    ductal ca s/p L lumpectomy  . Diastolic dysfunction   . HYPERTENSION   . HYPERLIPIDEMIA   . GERD   . HYPOTHYROIDISM   . Hyperparathyroidism     2 lobes removed  . Venous insufficiency     chronic BLE edema  . Spinal stenosis   . Anemia     a. Macrocytic - normal B12, folate 08/2011. b. 3/6 heme positive stools 10/2011 - per PCP note, elected against colonscopy.  [name] TIA (transient ischemic attack)  05/2011  . Anxiety     Past Surgical History  Procedure Laterality Date  . Pacemaker placement  2005    SSS and syncope  . Cholecystectomy  04/06/99  . Abdominal hysterectomy  1970    Partial  . Left hip arthroscopy  1995  . Right hip arthroscopy  01/2001  . Carpal tunnel release  1998    bilateral  . Breast surgery  12/2006    Left breast lupectomy- Ductal CA  . Left parathyroidectomy  07/2008  . Esophagogastroduodenoscopy N/A 10/18/2012    Procedure: ESOPHAGOGASTRODUODENOSCOPY (EGD);  Surgeon: Ladene Artist, MD;  Location: [facility];  Service: Endoscopy;  Laterality: N/A;  .  Esophagogastroduodenoscopy (egd) with propofol N/A 01/29/2015    Procedure: ESOPHAGOGASTRODUODENOSCOPY (EGD) WITH PROPOFOL;  Surgeon: Inda Castle, MD;  Location: [facility] END[facility];  Service: Endoscopy;  Laterality: N/A;  . Ercp N/A 01/29/2015    Procedure: ENDOSCOPIC RETROGRADE CHOLANGIOPANCREATOGRAPHY (ERCP);  Surgeon: Inda Castle, MD;  Location: [facility];  Service: Endoscopy;  Laterality: N/A;    Social History   Social History  . Marital Status: Married    Spouse Name: N/A  . Number of Children: 6  . Years of Education: N/A   Occupational History  . Retired Agricultural engineer    Social History Main Topics  . Smoking status: Never Smoker   . Smokeless tobacco: Never Used  . Alcohol Use: No  . Drug Use: No  . Sexual Activity: Not Currently   Other Topics Concern  . None   Social History Narrative   Lives at [facility] since 03/2009- married, lives with spouse. Moved here from [facility] to be near son    Family History  Problem Relation Age of Onset  . Ovarian cancer Mother   . Coronary artery disease Sister   . Coronary artery disease Brother   . Hypertension Mother     grandparent  . Lung cancer Father     Review of Systems  Constitutional: Negative for fever.  HENT: Positive for mouth sores (Inside lower lip) and sore throat (Mild). Negative for dental problem and  trouble swallowing.        Objective:   Filed Vitals:   09/21/15 1609  BP: 116/68  Pulse: 55  Temp: 98.1 F (36.7 C)   There were no vitals filed for this visit. There is no weight on file to calculate BMI.   Physical Exam  Constitutional: She appears well-developed and well-nourished. No distress.  HENT:  Head: Normocephalic and atraumatic.  Mouth/Throat: No oropharyngeal exudate.  Dry mucous membranes, red beefy tongue without white exudate or coating, inside lower lip there is a shallow ulcer with irregular border, no active bleeding or discharge, no other ulcers or lesions  Neck: Neck supple. No tracheal deviation present. No thyromegaly present.  Musculoskeletal: She exhibits edema.  Lymphadenopathy:    She has no cervical adenopathy.  Skin: She is not diaphoretic.  Left anterior lower leg bandaged cut from where she hit herself. Bandaged removed and no active bleeding. Rebandaged.  No evidence of infection           Assessment & Plan:   See Problem List for Assessment and Plan of chronic medical problems.

## 2015-09-21 NOTE — Patient Instructions (Addendum)
Probable thrush and oral ulcer on lower lip.  Dry mouth syndrome.    Stop the magic mouthwash and start nystatin, which was sent to the pharmacy.   If symptoms have not resolved in 10 days please call and we will consider having you see an ENT.

## 2015-09-21 NOTE — Assessment & Plan Note (Addendum)
Her tongue appearance is consistent with thrush. I will not do a culture since most likely we will see a response to treatment quicker than a culture will take Start nystatin swish and swallow If no improvement in 10 days and will call May need preventative treatment given that she is on 5 mg prednisone long-term She does have dentures and they may also need to be treated

## 2015-09-21 NOTE — Progress Notes (Signed)
Pre visit review using our clinic review tool, if applicable. No additional management support is needed unless otherwise documented below in the visit note. 

## 2015-09-21 NOTE — Assessment & Plan Note (Signed)
Does not seem to be a cold sore. May be related to trauma-biting the area with her dentures. I would've expected it to heal, but she does appear to have progression has a very dry mouth Increase fluids Will start nystatin swish and swallow Call if improvement

## 2015-09-24 ENCOUNTER — Ambulatory Visit (INDEPENDENT_AMBULATORY_CARE_PROVIDER_SITE_OTHER): Payer: Medicare Other | Admitting: *Deleted

## 2015-09-24 ENCOUNTER — Encounter: Payer: Self-pay | Admitting: Internal Medicine

## 2015-09-24 DIAGNOSIS — I482 Chronic atrial fibrillation, unspecified: Secondary | ICD-10-CM

## 2015-09-24 DIAGNOSIS — R001 Bradycardia, unspecified: Secondary | ICD-10-CM

## 2015-09-24 LAB — CUP PACEART INCLINIC DEVICE CHECK
Implantable Lead Implant Date: 20070727
Implantable Lead Location: 753859
Implantable Lead Model: 4136
Implantable Lead Serial Number: 24435626
Implantable Lead Serial Number: 24438928
Lead Channel Impedance Value: 580 Ohm
Lead Channel Pacing Threshold Pulse Width: 0.8 ms
Lead Channel Sensing Intrinsic Amplitude: 11.3 mV
MDC IDC LEAD IMPLANT DT: 20070727
MDC IDC LEAD LOCATION: 753860
MDC IDC LEAD MODEL: 4135
MDC IDC MSMT LEADCHNL RV PACING THRESHOLD AMPLITUDE: 0.8 V
MDC IDC SESS DTM: 20170525040000
MDC IDC SET LEADCHNL RV PACING AMPLITUDE: 2.4 V
MDC IDC SET LEADCHNL RV PACING PULSEWIDTH: 0.8 ms
MDC IDC SET LEADCHNL RV SENSING SENSITIVITY: 2.5 mV
MDC IDC STAT BRADY RA PERCENT PACED: 0 %
MDC IDC STAT BRADY RV PERCENT PACED: 39 %
Pulse Gen Model: 1290
Pulse Gen Serial Number: 785093

## 2015-09-24 NOTE — Progress Notes (Signed)
Pacemaker check in clinic. Normal device function. Threshold, sensing, and impedance consistent with previous measurements. Device programmed to maximize longevity. Permanent AF + Eliquis. No high ventricular rates noted. Device programmed at appropriate safety margins. Histogram distribution appropriate for patient activity level. Device programmed to optimize intrinsic conduction. Estimated longevity <0.5 years. Patient will follow up with JA in 10-2015.

## 2015-10-01 DIAGNOSIS — I82409 Acute embolism and thrombosis of unspecified deep veins of unspecified lower extremity: Secondary | ICD-10-CM

## 2015-10-01 HISTORY — DX: Acute embolism and thrombosis of unspecified deep veins of unspecified lower extremity: I82.409

## 2015-10-05 ENCOUNTER — Ambulatory Visit (INDEPENDENT_AMBULATORY_CARE_PROVIDER_SITE_OTHER): Payer: Medicare Other | Admitting: Family Medicine

## 2015-10-05 ENCOUNTER — Telehealth: Payer: Self-pay | Admitting: *Deleted

## 2015-10-05 ENCOUNTER — Telehealth: Payer: Self-pay | Admitting: Internal Medicine

## 2015-10-05 ENCOUNTER — Encounter: Payer: Self-pay | Admitting: Family Medicine

## 2015-10-05 VITALS — BP 118/70 | HR 80 | Temp 97.9°F | Ht 61.0 in

## 2015-10-05 DIAGNOSIS — L03116 Cellulitis of left lower limb: Secondary | ICD-10-CM | POA: Diagnosis not present

## 2015-10-05 DIAGNOSIS — K1379 Other lesions of oral mucosa: Secondary | ICD-10-CM | POA: Diagnosis not present

## 2015-10-05 DIAGNOSIS — S81802A Unspecified open wound, left lower leg, initial encounter: Secondary | ICD-10-CM

## 2015-10-05 MED ORDER — CEPHALEXIN 500 MG PO CAPS: 500.0000 mg | ORAL_CAPSULE | Freq: Two times a day (BID) | ORAL | Status: DC

## 2015-10-05 NOTE — Patient Instructions (Signed)
Please schedule a follow up with your doctor in 2 days. Call today to set up appointment.  Start the antibiotic. Keep the leg elevated as tolerated 30 minutes twice daily.  Change the dressing daily.  See the Ear, nose and throat doctor - call number provided.  Try to use the pain medications cautiously

## 2015-10-05 NOTE — Telephone Encounter (Signed)
Patient Name: Nicole Roberts  DOB: August 14, 1921    Initial Comment Caller states, mother saw the Dr 2 weeks ago, Dx with both Thrush and a wound on her leg neither of which have not healed. She fell and has an injured tail bone, and now she mave has a UTI. Sx has gotten disoriented and is not communicating as normal.    Nurse Assessment  Nurse: Orvan Seen, RN, Jacquilin Date/Time (Eastern Time): 10/05/2015 8:54:19 AM  Confirm and document reason for call. If symptomatic, describe symptoms. You must click the next button to save text entered. ---Caller states, mother saw the Dr 2 weeks ago, Dx with both Thrush and a wound on her leg neither of which have not healed. She fell and has an injured tail bone (was seen at ortho for injury), and now she have has a UTI. Sx has gotten disoriented and is not communicating as normal (started last week). Family states the patient can still tell you who she is, where she is- Family states she is "just acting funny since last week"  Has the patient traveled out of the country within the last 30 days? ---No  Does the patient have any new or worsening symptoms? ---Yes  Will a triage be completed? ---Yes  Related visit to physician within the last 2 weeks? ---Yes  Does the PT have any chronic conditions? (i.e. diabetes, asthma, etc.) ---Yes  Is this a behavioral health or substance abuse call? ---No     Guidelines    Guideline Title Affirmed Question Affirmed Notes  Weakness (Generalized) and Fatigue [1] MODERATE weakness (i.e., interferes with work, school, normal activities) AND [2] cause unknown (Exceptions: weakness with acute minor illness, or weakness from poor fluid intake)    Final Disposition User   See Physician within 4 Hours (or PCP triage) Orvan Seen, RN, Jacquilin    Comments  Attempted to schedule appointment with pcp but could not find one open- Scheduled with Dr. Maudie Mercury at 1300.   Referrals  REFERRED TO PCP OFFICE   Disagree/Comply: Comply

## 2015-10-05 NOTE — Progress Notes (Signed)
HPI:  Nicole Roberts is a pleasant 82 yo here for an acute visit for a possible UTI and several other issues.  ? UTI: -son is with her and want to check urine -she had episode 4 days ago when she called him by her brother's name on the phone -reports she had been fine since, but in the past this was a sign of UTI -no dysuria, frequency, urgency, fever, malaise -she is on increased opoid pain medications for ? Sacral fx - seeing ortho for this; per son on fent patch chronically, but now using prn norco as well several times per day and the episode was after taking norco  Sore in mouth: -reports treated by PA and PCP at her office and told if did not resolve to see ENT -tongue symptoms resolved but still has sore in mouth -they want to know whom is good in town to see in ENT -denies other sores, dysphagia, fevers, malaise  Leg infection: -wound on leg for 1 month -son reports chronic LE and poor healing with hx cellulitis and he is concerned this is becoming infected -reports PCP aware of wound on LE and they are treating with dressing changes but he feels is not healing and is worsening -she lives at [facility]  ROS: See pertinent positives and negatives per HPI.  Past Medical History  Diagnosis Date  . HEARING LOSS   . MITRAL VALVE INSUFF&AORTIC VALVE INSUFF     moderate MR  . PULMONARY HYPERTENSION   . Atrial fibrillation ([place])     permanent  . SICK SINUS SYNDROME     a. Tachybrady syndrome - Guidant PPM 10/2005.  . Edema 03/19/2009    due to venous insufficiency, chronic lymphedema  . URINARY INCONTINENCE   . Rheumatoid arthritis(714.0) dx clarified 2011    On prednisone  . BREAST CANCER 12/2006    ductal ca s/p L lumpectomy  . Diastolic dysfunction   . HYPERTENSION   . HYPERLIPIDEMIA   . GERD   . HYPOTHYROIDISM   . Hyperparathyroidism     2 lobes removed  . Venous insufficiency     chronic BLE edema  . Spinal stenosis   . Anemia     a. Macrocytic - normal  B12, folate 08/2011. b. 3/6 heme positive stools 10/2011 - per PCP note, elected against colonscopy.  [name] TIA (transient ischemic attack) 05/2011  . Anxiety     Past Surgical History  Procedure Laterality Date  . Pacemaker placement  2005    SSS and syncope  . Cholecystectomy  04/06/99  . Abdominal hysterectomy  1970    Partial  . Left hip arthroscopy  1995  . Right hip arthroscopy  01/2001  . Carpal tunnel release  1998    bilateral  . Breast surgery  12/2006    Left breast lupectomy- Ductal CA  . Left parathyroidectomy  07/2008  . Esophagogastroduodenoscopy N/A 10/18/2012    Procedure: ESOPHAGOGASTRODUODENOSCOPY (EGD);  Surgeon: Ladene Artist, MD;  Location: [facility];  Service: Endoscopy;  Laterality: N/A;  . Esophagogastroduodenoscopy (egd) with propofol N/A 01/29/2015    Procedure: ESOPHAGOGASTRODUODENOSCOPY (EGD) WITH PROPOFOL;  Surgeon: Inda Castle, MD;  Location: [facility];  Service: Endoscopy;  Laterality: N/A;  . Ercp N/A 01/29/2015    Procedure: ENDOSCOPIC RETROGRADE CHOLANGIOPANCREATOGRAPHY (ERCP);  Surgeon: Inda Castle, MD;  Location: [facility];  Service: Endoscopy;  Laterality: N/A;    Family History  Problem Relation Age of Onset  . Ovarian cancer Mother   .  Coronary artery disease Sister   . Coronary artery disease Brother   . Hypertension Mother     grandparent  . Lung cancer Father     Social History   Social History  . Marital Status: Married    Spouse Name: N/A  . Number of Children: 6  . Years of Education: N/A   Occupational History  . Retired Agricultural engineer    Social History Main Topics  . Smoking status: Never Smoker   . Smokeless tobacco: Never Used  . Alcohol Use: No  . Drug Use: No  . Sexual Activity: Not Currently   Other Topics Concern  . None   Social History Narrative   Lives at [facility] since 03/2009- married, lives with spouse. Moved here from [facility] to be near son     Current outpatient  prescriptions:  .  apixaban (ELIQUIS) 2.5 MG TABS tablet, Take 1 tablet (2.5 mg total) by mouth 2 (two) times daily., Disp: 60 tablet, Rfl: 11 .  atorvastatin (LIPITOR) 10 MG tablet, Take 10 mg by mouth at bedtime., Disp: , Rfl:  .  calcium citrate-vitamin D (CITRACAL+D) 315-200 MG-UNIT tablet, Take 1 tablet by mouth 2 (two) times daily., Disp: 180 tablet, Rfl: 3 .  cholecalciferol (VITAMIN D) 1000 UNITS tablet, Take 1,000 Units by mouth daily., Disp: , Rfl:  .  citalopram (CELEXA) 20 MG tablet, Take 20 mg by mouth daily., Disp: , Rfl:  .  fentaNYL (DURAGESIC - DOSED MCG/HR) 75 MCG/HR, Place 1 patch (75 mcg total) onto the skin every 3 (three) days., Disp: 10 patch, Rfl: 0 .  folic acid (FOLVITE) A999333 MCG tablet, Take 400 mcg by mouth every morning., Disp: , Rfl:  .  furosemide (LASIX) 20 MG tablet, Take 2 tablets (40 mg total) by mouth daily. Until directed to decrease back to 20 mg daily, Disp: 180 tablet, Rfl: 1 .  gabapentin (NEURONTIN) 400 MG capsule, Take 2 capsules (800 mg total) by mouth at bedtime., Disp: 180 capsule, Rfl: 1 .  HYDROcodone-acetaminophen (NORCO) 7.5-325 MG tablet, Take 1 tablet by mouth every 6 (six) hours as needed for moderate pain., Disp: , Rfl:  .  isosorbide mononitrate (IMDUR) 30 MG 24 hr tablet, Take 15 mg by mouth daily., Disp: , Rfl:  .  levothyroxine (SYNTHROID, LEVOTHROID) 50 MCG tablet, Take 1 tablet (50 mcg total) by mouth daily., Disp: 90 tablet, Rfl: 3 .  Magnesium 400 MG TABS, Take 1 tablet by mouth daily., Disp: 30 tablet, Rfl: 5 .  metoprolol succinate (TOPROL-XL) 50 MG 24 hr tablet, Take 1 tablet (50 mg total) by mouth 2 (two) times daily. Take with or immediately following a meal., Disp: 60 tablet, Rfl: 11 .  Mouthwashes (MOUTHWASH/GARGLE) LIQD, SWISH, GARGLE AND SPIT 1 TO 2 TEASPOONSFUL BY MOUTH 3 TIMES DAILY. MAY BE SWALLOWED IF ESOPHAGEAL INVOLVEMENT. PATIENT MAY SELF MEDICATE. ME, Disp: 120 mL, Rfl: 0 .  Multiple Vitamin (DAILY-VITE) TABS, TAKE ONE  TABLET BY MOUTH ONCE DAILY., Disp: 30 tablet, Rfl: 2 .  Multiple Vitamin (MULTIVITAMIN) LIQD, Take 5 mLs by mouth daily., Disp: , Rfl:  .  nitroGLYCERIN (NITROSTAT) 0.4 MG SL tablet, Place 0.4 mg under the tongue every 5 (five) minutes x 3 doses as needed for chest pain., Disp: , Rfl:  .  nystatin (MYCOSTATIN) 100000 UNIT/ML suspension, Take 5 mLs (500,000 Units total) by mouth 4 (four) times daily. Use for two days after symptoms have resolved and then stop, Disp: 473 mL, Rfl: 0 .  ondansetron (  ZOFRAN) 4 MG tablet, Take 1 tablet (4 mg total) by mouth every 12 (twelve) hours as needed for nausea. For nausea, Disp: 30 tablet, Rfl: 1 .  pantoprazole (PROTONIX) 40 MG tablet, Take 1 tablet (40 mg total) by mouth 2 (two) times daily., Disp: 60 tablet, Rfl: 3 .  potassium chloride SA (K-DUR,KLOR-CON) 20 MEQ tablet, TAKE ONE TABLET BY MOUTH ONCE DAILY., Disp: 30 tablet, Rfl: 5 .  predniSONE (DELTASONE) 5 MG tablet, Take 5 mg by mouth daily with breakfast., Disp: , Rfl:  .  sucralfate (CARAFATE) 1 GM/10ML suspension, Take 1 g by mouth 2 (two) times daily., Disp: , Rfl:  .  vitamin B-12 (CYANOCOBALAMIN) 500 MCG tablet, TAKE ONE TABLET BY MOUTH ONCE DAILY., Disp: 30 tablet, Rfl: 11 .  cephALEXin (KEFLEX) 500 MG capsule, Take 1 capsule (500 mg total) by mouth 2 (two) times daily., Disp: 10 capsule, Rfl: 0  EXAM:  Filed Vitals:   10/05/15 1310  BP: 118/70  Pulse: 80  Temp: 97.9 F (36.6 C)    There is no weight on file to calculate BMI.  GENERAL: vitals reviewed and listed above, alert, oriented, appears well hydrated and in no acute distress, in wheelchair  HEENT: atraumatic, conjunttiva clear, no obvious abnormalities on inspection of external nose and ears  NECK: no obvious masses on inspection  LUNGS: clear to auscultation bilaterally, no wheezes, rales or rhonchi, good air movement  CV: irr irr, bilateral LE ededma  MS: moves all extremities without noticeable abnormality  SKIN: healing  skin tear L LE with clear drainage, some surrounding erythema and warmth of the skin  PSYCH: Alert and oriented, pleasant and cooperative, no obvious depression or anxiety , speech and thought processing grossly intact  ASSESSMENT AND PLAN:  Discussed the following assessment and plan:  Leg wound, left, initial encounter  Cellulitis of left lower extremity  Sore in mouth  -keflex for mild cellulitis - f/u with PCP in 2 days; elevation of leg as tolerated - advised prompt re-eval sooner if worsening, redness spreading, fevers or malaise -caution advised with the pain medications - no signs of confusion or AMS today -udip was not obtained as she was not able to give a sample as she had just urinated before coming - the keflex should provide good coverage if UTI and advised urine collection if symptoms persist or dysuria develops  -ENT office numbers provided for son to contact if mouth issues persist -Patient advised to return or notify a doctor immediately if symptoms worsen or persist or new concerns arise.  Patient Instructions  Please schedule a follow up with your doctor in 2 days. Call today to set up appointment.  Start the antibiotic. Keep the leg elevated as tolerated 30 minutes twice daily.  Change the dressing daily.  See the Ear, nose and throat doctor - call number provided.  Try to use the pain medications cautiously     Samaira Holzworth R.

## 2015-10-05 NOTE — Telephone Encounter (Signed)
Please advise 

## 2015-10-05 NOTE — Telephone Encounter (Signed)
Patient's son asked if she could be referred to the wound care for the left lower extremity wound?  Please call him at [phone].

## 2015-10-05 NOTE — Telephone Encounter (Signed)
Agree - being seen today

## 2015-10-05 NOTE — Progress Notes (Signed)
Pre visit review using our clinic review tool, if applicable. No additional management support is needed unless otherwise documented below in the visit note. 

## 2015-10-06 ENCOUNTER — Encounter (HOSPITAL_COMMUNITY): Payer: Self-pay | Admitting: Emergency Medicine

## 2015-10-06 ENCOUNTER — Emergency Department (HOSPITAL_COMMUNITY): Payer: Medicare Other

## 2015-10-06 ENCOUNTER — Inpatient Hospital Stay (HOSPITAL_COMMUNITY)
Admission: EM | Admit: 2015-10-06 | Discharge: 2015-10-15 | DRG: 982 | Disposition: A | Discharge status: Skilled Nursing Facility | Payer: Medicare Other | Attending: Internal Medicine | Admitting: Internal Medicine

## 2015-10-06 DIAGNOSIS — M069 Rheumatoid arthritis, unspecified: Secondary | ICD-10-CM | POA: Diagnosis present

## 2015-10-06 DIAGNOSIS — N183 Chronic kidney disease, stage 3 unspecified: Secondary | ICD-10-CM | POA: Diagnosis present

## 2015-10-06 DIAGNOSIS — W050XXA Fall from non-moving wheelchair, initial encounter: Secondary | ICD-10-CM | POA: Diagnosis present

## 2015-10-06 DIAGNOSIS — Z66 Do not resuscitate: Secondary | ICD-10-CM | POA: Diagnosis present

## 2015-10-06 DIAGNOSIS — Z950 Presence of cardiac pacemaker: Secondary | ICD-10-CM | POA: Diagnosis not present

## 2015-10-06 DIAGNOSIS — K219 Gastro-esophageal reflux disease without esophagitis: Secondary | ICD-10-CM | POA: Diagnosis present

## 2015-10-06 DIAGNOSIS — S3210XA Unspecified fracture of sacrum, initial encounter for closed fracture: Secondary | ICD-10-CM

## 2015-10-06 DIAGNOSIS — I82409 Acute embolism and thrombosis of unspecified deep veins of unspecified lower extremity: Secondary | ICD-10-CM | POA: Diagnosis not present

## 2015-10-06 DIAGNOSIS — M549 Dorsalgia, unspecified: Secondary | ICD-10-CM | POA: Diagnosis present

## 2015-10-06 DIAGNOSIS — I482 Chronic atrial fibrillation: Secondary | ICD-10-CM | POA: Diagnosis present

## 2015-10-06 DIAGNOSIS — E039 Hypothyroidism, unspecified: Secondary | ICD-10-CM | POA: Diagnosis present

## 2015-10-06 DIAGNOSIS — I272 Other secondary pulmonary hypertension: Secondary | ICD-10-CM | POA: Diagnosis present

## 2015-10-06 DIAGNOSIS — Z853 Personal history of malignant neoplasm of breast: Secondary | ICD-10-CM

## 2015-10-06 DIAGNOSIS — Z7901 Long term (current) use of anticoagulants: Secondary | ICD-10-CM | POA: Diagnosis not present

## 2015-10-06 DIAGNOSIS — I5042 Chronic combined systolic (congestive) and diastolic (congestive) heart failure: Secondary | ICD-10-CM | POA: Diagnosis present

## 2015-10-06 DIAGNOSIS — I10 Essential (primary) hypertension: Secondary | ICD-10-CM | POA: Diagnosis present

## 2015-10-06 DIAGNOSIS — I080 Rheumatic disorders of both mitral and aortic valves: Secondary | ICD-10-CM | POA: Diagnosis present

## 2015-10-06 DIAGNOSIS — I82441 Acute embolism and thrombosis of right tibial vein: Secondary | ICD-10-CM | POA: Diagnosis present

## 2015-10-06 DIAGNOSIS — I481 Persistent atrial fibrillation: Secondary | ICD-10-CM | POA: Diagnosis present

## 2015-10-06 DIAGNOSIS — I4891 Unspecified atrial fibrillation: Secondary | ICD-10-CM | POA: Diagnosis not present

## 2015-10-06 DIAGNOSIS — Z7952 Long term (current) use of systemic steroids: Secondary | ICD-10-CM

## 2015-10-06 DIAGNOSIS — L03116 Cellulitis of left lower limb: Principal | ICD-10-CM

## 2015-10-06 DIAGNOSIS — H9190 Unspecified hearing loss, unspecified ear: Secondary | ICD-10-CM | POA: Diagnosis present

## 2015-10-06 DIAGNOSIS — Z8673 Personal history of transient ischemic attack (TIA), and cerebral infarction without residual deficits: Secondary | ICD-10-CM | POA: Diagnosis not present

## 2015-10-06 DIAGNOSIS — M545 Low back pain: Secondary | ICD-10-CM | POA: Diagnosis present

## 2015-10-06 DIAGNOSIS — I82401 Acute embolism and thrombosis of unspecified deep veins of right lower extremity: Secondary | ICD-10-CM

## 2015-10-06 DIAGNOSIS — R109 Unspecified abdominal pain: Secondary | ICD-10-CM

## 2015-10-06 DIAGNOSIS — E785 Hyperlipidemia, unspecified: Secondary | ICD-10-CM | POA: Diagnosis present

## 2015-10-06 DIAGNOSIS — D539 Nutritional anemia, unspecified: Secondary | ICD-10-CM | POA: Diagnosis present

## 2015-10-06 DIAGNOSIS — I130 Hypertensive heart and chronic kidney disease with heart failure and stage 1 through stage 4 chronic kidney disease, or unspecified chronic kidney disease: Secondary | ICD-10-CM | POA: Diagnosis present

## 2015-10-06 DIAGNOSIS — K661 Hemoperitoneum: Secondary | ICD-10-CM

## 2015-10-06 DIAGNOSIS — L0390 Cellulitis, unspecified: Secondary | ICD-10-CM | POA: Diagnosis present

## 2015-10-06 DIAGNOSIS — K683 Retroperitoneal hematoma: Secondary | ICD-10-CM

## 2015-10-06 DIAGNOSIS — S301XXA Contusion of abdominal wall, initial encounter: Secondary | ICD-10-CM | POA: Diagnosis present

## 2015-10-06 DIAGNOSIS — G8929 Other chronic pain: Secondary | ICD-10-CM | POA: Diagnosis present

## 2015-10-06 LAB — CBC WITH DIFFERENTIAL/PLATELET
BASOS PCT: 0 %
Basophils Absolute: 0 10*3/uL (ref 0.0–0.1)
Eosinophils Absolute: 0 10*3/uL (ref 0.0–0.7)
Eosinophils Relative: 0 %
HEMATOCRIT: 35.5 % — AB (ref 36.0–46.0)
HEMOGLOBIN: 11.3 g/dL — AB (ref 12.0–15.0)
LYMPHS PCT: 9 %
Lymphs Abs: 0.9 10*3/uL (ref 0.7–4.0)
MCH: 35.2 pg — AB (ref 26.0–34.0)
MCHC: 31.8 g/dL (ref 30.0–36.0)
MCV: 110.6 fL — AB (ref 78.0–100.0)
MONOS PCT: 12 %
Monocytes Absolute: 1.3 10*3/uL — ABNORMAL HIGH (ref 0.1–1.0)
NEUTROS ABS: 8.3 10*3/uL — AB (ref 1.7–7.7)
Neutrophils Relative %: 79 %
Platelets: 190 10*3/uL (ref 150–400)
RBC: 3.21 MIL/uL — ABNORMAL LOW (ref 3.87–5.11)
RDW: 15.6 % — ABNORMAL HIGH (ref 11.5–15.5)
WBC: 10.5 10*3/uL (ref 4.0–10.5)

## 2015-10-06 LAB — COMPREHENSIVE METABOLIC PANEL
ALBUMIN: 2.8 g/dL — AB (ref 3.5–5.0)
ALK PHOS: 181 U/L — AB (ref 38–126)
ALT: 28 U/L (ref 14–54)
ANION GAP: 9 (ref 5–15)
AST: 27 U/L (ref 15–41)
BUN: 31 mg/dL — ABNORMAL HIGH (ref 6–20)
CALCIUM: 9 mg/dL (ref 8.9–10.3)
CO2: 29 mmol/L (ref 22–32)
Chloride: 102 mmol/L (ref 101–111)
Creatinine, Ser: 1.5 mg/dL — ABNORMAL HIGH (ref 0.44–1.00)
GFR calc Af Amer: 34 mL/min — ABNORMAL LOW (ref >60)
GFR calc non Af Amer: 29 mL/min — ABNORMAL LOW (ref >60)
GLUCOSE: 85 mg/dL (ref 65–99)
POTASSIUM: 4.5 mmol/L (ref 3.5–5.1)
SODIUM: 140 mmol/L (ref 135–145)
Total Bilirubin: 0.9 mg/dL (ref 0.3–1.2)
Total Protein: 6.2 g/dL — ABNORMAL LOW (ref 6.5–8.1)

## 2015-10-06 LAB — I-STAT CG4 LACTIC ACID, ED
Lactic Acid, Venous: 1.35 mmol/L (ref 0.5–2.0)
Lactic Acid, Venous: 1.5 mmol/L (ref 0.5–2.0)

## 2015-10-06 MED ORDER — SODIUM CHLORIDE 0.9 % IV BOLUS (SEPSIS)
1000.0000 mL | Freq: Once | INTRAVENOUS | Status: AC
  Administered 2015-10-06: 1000 mL via INTRAVENOUS

## 2015-10-06 MED ORDER — VANCOMYCIN HCL IN DEXTROSE 1-5 GM/200ML-% IV SOLN
1000.0000 mg | Freq: Once | INTRAVENOUS | Status: AC
  Administered 2015-10-06: 1000 mg via INTRAVENOUS
  Filled 2015-10-06: qty 200

## 2015-10-06 MED ORDER — SODIUM CHLORIDE 0.9 % IV BOLUS (SEPSIS): 250.0000 mL | Freq: Once | INTRAVENOUS | Status: DC

## 2015-10-06 MED ORDER — PIPERACILLIN-TAZOBACTAM 3.375 G IVPB 30 MIN
3.3750 g | Freq: Once | INTRAVENOUS | Status: AC
  Administered 2015-10-06: 3.375 g via INTRAVENOUS
  Filled 2015-10-06: qty 50

## 2015-10-06 MED ORDER — SODIUM CHLORIDE 0.9 % IV BOLUS (SEPSIS): 1000.0000 mL | Freq: Once | INTRAVENOUS | Status: DC

## 2015-10-06 NOTE — Telephone Encounter (Signed)
I called the pts son and left a detailed message at his cell number the referral was entered and someone will call with appt info.

## 2015-10-06 NOTE — Telephone Encounter (Signed)
Ok

## 2015-10-06 NOTE — ED Notes (Signed)
Code Sepsis called @ 2041

## 2015-10-06 NOTE — ED Notes (Signed)
Code Sepsis cancelled @ 2108

## 2015-10-06 NOTE — Addendum Note (Signed)
Addended by: Agnes Lawrence on: 10/06/2015 08:16 AM   Modules accepted: Orders

## 2015-10-06 NOTE — ED Notes (Signed)
Per [facility] Pt is coming from [facility]. Pt had a skin tear that happened approx 10 days ago and went to the doctor yesterday. Majority of the shin and calf is red and warm top touch. This has presented for approx 3 days. Pt states it is painful from Ankle to hip 10/10 pain. Peripheral edema is normal to her. 132/78 90 16 R CBG 119 97.5 temporal

## 2015-10-06 NOTE — ED Provider Notes (Signed)
CSN: [id number]     Arrival date & time 10/06/15  2011 History   First MD Initiated Contact with Patient 10/06/15 2016     Chief Complaint  Patient presents with  . Leg Pain  . Cellulitis    HPI   Nicole Roberts is an 82 y.o. female with history of afib (on Eliquis), pulmonary HTN, HTN, HLD, venous insufficiency who presents to the ED for evaluation of left leg cellulitis. She is a resident of [facility]. Her son accompanies her and provides some of the history. She states that about one month ago she hit a drawer on her left shin while she was in her wheelchair and it formed a skin tear. Since then it has been slow to heal. She went to PCP yesterday and was started on a course of PO keflex of which she has taken two doses. Her son states that today he went to see pt and her entire left shin was red and looked like cellulitis. The redness has started spreading up her thigh even over the past couple hours. Pt states the leg is not very painful at rest but whenever she moves it she reports 10/10 sharp shooting pain up her entire leg into her thigh and hip. She is unsure if she has had a fever at home. Her son states that he is concerned because pt has had intermittent periods of confusion over the past few days. However, two weeks ago pt fell while transferring to her wheelchair and has a possible sacral fracture (seeing [facility]). Since then she has been on hydrocodone prn and her family is unsure if this is adding to her mental status change. Pt has reportedly also had several episodes of urinary incontinence at home which is not typical of her. In the ED she denies chest pain, SOB, abdominal pain, n/v/d. Her leg pain is minimal at rest. She denies back pain.   Past Medical History  Diagnosis Date  . HEARING LOSS   . MITRAL VALVE INSUFF&AORTIC VALVE INSUFF     moderate MR  . PULMONARY HYPERTENSION   . Atrial fibrillation ([place])     permanent  . SICK SINUS SYNDROME     a. Tachybrady  syndrome - Guidant PPM 10/2005.  . Edema 03/19/2009    due to venous insufficiency, chronic lymphedema  . URINARY INCONTINENCE   . Rheumatoid arthritis(714.0) dx clarified 2011    On prednisone  . BREAST CANCER 12/2006    ductal ca s/p L lumpectomy  . Diastolic dysfunction   . HYPERTENSION   . HYPERLIPIDEMIA   . GERD   . HYPOTHYROIDISM   . Hyperparathyroidism     2 lobes removed  . Venous insufficiency     chronic BLE edema  . Spinal stenosis   . Anemia     a. Macrocytic - normal B12, folate 08/2011. b. 3/6 heme positive stools 10/2011 - per PCP note, elected against colonscopy.  [name] TIA (transient ischemic attack) 05/2011  . Anxiety    Past Surgical History  Procedure Laterality Date  . Pacemaker placement  2005    SSS and syncope  . Cholecystectomy  04/06/99  . Abdominal hysterectomy  1970    Partial  . Left hip arthroscopy  1995  . Right hip arthroscopy  01/2001  . Carpal tunnel release  1998    bilateral  . Breast surgery  12/2006    Left breast lupectomy- Ductal CA  . Left parathyroidectomy  07/2008  . Esophagogastroduodenoscopy N/A 10/18/2012  Procedure: ESOPHAGOGASTRODUODENOSCOPY (EGD);  Surgeon: Ladene Artist, MD;  Location: [facility];  Service: Endoscopy;  Laterality: N/A;  . Esophagogastroduodenoscopy (egd) with propofol N/A 01/29/2015    Procedure: ESOPHAGOGASTRODUODENOSCOPY (EGD) WITH PROPOFOL;  Surgeon: Inda Castle, MD;  Location: [facility];  Service: Endoscopy;  Laterality: N/A;  . Ercp N/A 01/29/2015    Procedure: ENDOSCOPIC RETROGRADE CHOLANGIOPANCREATOGRAPHY (ERCP);  Surgeon: Inda Castle, MD;  Location: [facility];  Service: Endoscopy;  Laterality: N/A;   Family History  Problem Relation Age of Onset  . Ovarian cancer Mother   . Coronary artery disease Sister   . Coronary artery disease Brother   . Hypertension Mother     grandparent  . Lung cancer Father    Social History  Substance Use Topics  . Smoking status: Never Smoker   .  Smokeless tobacco: Never Used  . Alcohol Use: No   OB History    No data available     Review of Systems  All other systems reviewed and are negative.     Allergies  Sulfa antibiotics and Tramadol hcl  Home Medications   Prior to Admission medications   Medication Sig Start Date End Date Taking? Authorizing Provider  apixaban (ELIQUIS) 2.5 MG TABS tablet Take 1 tablet (2.5 mg total) by mouth 2 (two) times daily. 02/04/15   Charlynne Cousins, MD  atorvastatin (LIPITOR) 10 MG tablet Take 10 mg by mouth at bedtime.    Historical Provider, MD  calcium citrate-vitamin D (CITRACAL+D) 315-200 MG-UNIT tablet Take 1 tablet by mouth 2 (two) times daily. 08/24/15   [name], MD  cephALEXin (KEFLEX) 500 MG capsule Take 1 capsule (500 mg total) by mouth 2 (two) times daily. 10/05/15   Lucretia Kern, DO  cholecalciferol (VITAMIN D) 1000 UNITS tablet Take 1,000 Units by mouth daily.    Historical Provider, MD  citalopram (CELEXA) 20 MG tablet Take 20 mg by mouth daily.    Historical Provider, MD  fentaNYL (DURAGESIC - DOSED MCG/HR) 75 MCG/HR Place 1 patch (75 mcg total) onto the skin every 3 (three) days. 09/14/15   [name], MD  folic acid (FOLVITE) A999333 MCG tablet Take 400 mcg by mouth every morning.    Historical Provider, MD  furosemide (LASIX) 20 MG tablet Take 2 tablets (40 mg total) by mouth daily. Until directed to decrease back to 20 mg daily 03/18/15   [name], MD  gabapentin (NEURONTIN) 400 MG capsule Take 2 capsules (800 mg total) by mouth at bedtime. 12/02/14   Rowe Clack, MD  HYDROcodone-acetaminophen ([place]) 7.5-325 MG tablet Take 1 tablet by mouth every 6 (six) hours as needed for moderate pain.    Historical Provider, MD  isosorbide mononitrate (IMDUR) 30 MG 24 hr tablet Take 15 mg by mouth daily.    Historical Provider, MD  levothyroxine (SYNTHROID, LEVOTHROID) 50 MCG tablet Take 1 tablet (50 mcg total) by mouth daily. 06/09/14   Rowe Clack, MD  Magnesium  400 MG TABS Take 1 tablet by mouth daily. 06/10/15   [name], MD  metoprolol succinate (TOPROL-XL) 50 MG 24 hr tablet Take 1 tablet (50 mg total) by mouth 2 (two) times daily. Take with or immediately following a meal. 06/02/14   Rowe Clack, MD  Mouthwashes (MOUTHWASH/GARGLE) LIQD SWISH, GARGLE AND SPIT 1 TO 2 TEASPOONSFUL BY MOUTH 3 TIMES DAILY. MAY BE SWALLOWED IF ESOPHAGEAL INVOLVEMENT. PATIENT MAY SELF MEDICATE. ME 09/08/15   Golden Circle, FNP  Multiple Vitamin (DAILY-VITE) TABS TAKE ONE TABLET BY MOUTH ONCE DAILY. 07/17/15   [name], MD  Multiple Vitamin (MULTIVITAMIN) LIQD Take 5 mLs by mouth daily.    Historical Provider, MD  nitroGLYCERIN (NITROSTAT) 0.4 MG SL tablet Place 0.4 mg under the tongue every 5 (five) minutes x 3 doses as needed for chest pain. 03/20/12   Roger A Arguello, PA-C  nystatin (MYCOSTATIN) 100000 UNIT/ML suspension Take 5 mLs (500,000 Units total) by mouth 4 (four) times daily. Use for two days after symptoms have resolved and then stop 09/21/15   [name], MD  ondansetron (ZOFRAN) 4 MG tablet Take 1 tablet (4 mg total) by mouth every 12 (twelve) hours as needed for nausea. For nausea 02/25/13   Rowe Clack, MD  pantoprazole (PROTONIX) 40 MG tablet Take 1 tablet (40 mg total) by mouth 2 (two) times daily. 08/24/15   [name], MD  potassium chloride SA (K-DUR,KLOR-CON) 20 MEQ tablet TAKE ONE TABLET BY MOUTH ONCE DAILY. 06/26/15   [name], MD  predniSONE (DELTASONE) 5 MG tablet Take 5 mg by mouth daily with breakfast.    Historical Provider, MD  sucralfate (CARAFATE) 1 GM/10ML suspension Take 1 g by mouth 2 (two) times daily.    Historical Provider, MD  vitamin B-12 (CYANOCOBALAMIN) 500 MCG tablet TAKE ONE TABLET BY MOUTH ONCE DAILY. 11/20/13   Rowe Clack, MD   BP 118/72 mmHg  Pulse 94  Temp(Src) 99 F (37.2 C) (Rectal)  Resp 15  SpO2 97% Physical Exam  Constitutional: She is oriented to person, place, and time.  Elderly,  nontoxic appearing  HENT:  Right Ear: External ear normal.  Left Ear: External ear normal.  Nose: Nose normal.  Mouth/Throat: Oropharynx is clear and moist. No oropharyngeal exudate.  Eyes: Conjunctivae and EOM are normal. Pupils are equal, round, and reactive to light.  Neck: Normal range of motion. Neck supple.  Cardiovascular: Normal rate, regular rhythm, normal heart sounds and intact distal pulses.   Pulmonary/Chest: Effort normal and breath sounds normal. No respiratory distress. She has no wheezes.  Abdominal: Soft. Bowel sounds are normal. She exhibits no distension. There is no tenderness. There is no rebound and no guarding.  Musculoskeletal: She exhibits no edema.  1-2+ pitting edema bilateral shins through feet which pt states is baseline. 2+ pulses.  L shin with 2cm skin tear mid-anterior shin. Entire shin is erythematous with erythema spreading through foot and up lateral thigh. Nontender. Pt reports pain with movement.   No midline back tenderness  Neurological: She is alert and oriented to person, place, and time. No cranial nerve deficit.  Normal finger to nose No pronator drift Moves all extremities freely  Skin: Skin is warm and dry.  Psychiatric: She has a normal mood and affect.  Nursing note and vitals reviewed.   ED Course  Procedures (including critical care time) Labs Review Labs Reviewed  COMPREHENSIVE METABOLIC PANEL - Abnormal; Notable for the following:    BUN 31 (*)    Creatinine, Ser 1.50 (*)    Total Protein 6.2 (*)    Albumin 2.8 (*)    Alkaline Phosphatase 181 (*)    GFR calc non Af Amer 29 (*)    GFR calc Af Amer 34 (*)    All other components within normal limits  CBC WITH DIFFERENTIAL/PLATELET - Abnormal; Notable for the following:    RBC 3.21 (*)    Hemoglobin 11.3 (*)    HCT 35.5 (*)  MCV 110.6 (*)    MCH 35.2 (*)    RDW 15.6 (*)    Neutro Abs 8.3 (*)    Monocytes Absolute 1.3 (*)    All other components within normal limits   CULTURE, BLOOD (ROUTINE X 2)  CULTURE, BLOOD (ROUTINE X 2)  URINE CULTURE  URINALYSIS, ROUTINE W REFLEX MICROSCOPIC (NOT AT [facility])  I-STAT CG4 LACTIC ACID, ED  I-STAT CG4 LACTIC ACID, ED    Imaging Review Dg Tibia/fibula Left  10/06/2015  CLINICAL DATA:  Fall 10 days ago. Skin tear with possible infection. Initial encounter. EXAM: LEFT TIBIA AND FIBULA - 2 VIEW COMPARISON:  None. FINDINGS: Soft tissue swelling greatest about the lateral lower leg. There is no soft tissue gas or opaque foreign body. Negative for fracture or signs of osteomyelitis. Osteopenia and knee osteoarthritis. Arterial and soft tissue calcifications. IMPRESSION: Soft tissue swelling without opaque foreign body, soft tissue emphysema, or evidence of osteomyelitis. Electronically Signed   By: Monte Fantasia M.D.   On: 10/06/2015 22:00   I have personally reviewed and evaluated these images and lab results as part of my medical decision-making.   EKG Interpretation   Date/Time:  Tuesday October 06 2015 20:48:40 EDT Ventricular Rate:  91 PR Interval:    QRS Duration: 105 QT Interval:  398 QTC Calculation: 490 R Axis:   38 Text Interpretation:  Atrial fibrillation Low voltage, extremity and  precordial leads Probable anteroseptal infarct, old Prolonged QT interval  When compared with ECG of 11/02/2013, Right axis deviation is no longer  Present Confirmed by [facility]  MD, DAVID ([id number]) on 10/06/2015 9:00:32 PM      MDM   Final diagnoses:  Cellulitis of left lower extremity    Will initiate sepsis workup. However, Code Sepsis cancelled at this point as pt is technically afebrile without tachycardia or hypotension. Will start empirically on vanc/zosyn and give 1L NS bolus.  X-ray negative for gas or e/o osteomyelitis. Lactic negative. No white coutn. Pt remains hemodynamically stable. UA/UC pending. Blood cultures drawn. Given degree of cellulitis with AMS at home I called and spoke with the hospitalist team who will admit  pt to medicine. Appreciate assistance.    Anne Ng, PA-C 10/06/15 2330  Sherwood Gambler, MD 10/12/15 [phone]

## 2015-10-07 ENCOUNTER — Inpatient Hospital Stay (HOSPITAL_COMMUNITY): Payer: Medicare Other

## 2015-10-07 DIAGNOSIS — L03116 Cellulitis of left lower limb: Secondary | ICD-10-CM | POA: Insufficient documentation

## 2015-10-07 DIAGNOSIS — M0579 Rheumatoid arthritis with rheumatoid factor of multiple sites without organ or systems involvement: Secondary | ICD-10-CM

## 2015-10-07 DIAGNOSIS — I4891 Unspecified atrial fibrillation: Secondary | ICD-10-CM

## 2015-10-07 LAB — COMPREHENSIVE METABOLIC PANEL
ALBUMIN: 2.5 g/dL — AB (ref 3.5–5.0)
ALK PHOS: 169 U/L — AB (ref 38–126)
ALT: 24 U/L (ref 14–54)
ANION GAP: 8 (ref 5–15)
AST: 18 U/L (ref 15–41)
BUN: 28 mg/dL — ABNORMAL HIGH (ref 6–20)
CALCIUM: 8.7 mg/dL — AB (ref 8.9–10.3)
CHLORIDE: 105 mmol/L (ref 101–111)
CO2: 28 mmol/L (ref 22–32)
Creatinine, Ser: 1.44 mg/dL — ABNORMAL HIGH (ref 0.44–1.00)
GFR calc non Af Amer: 31 mL/min — ABNORMAL LOW (ref >60)
GFR, EST AFRICAN AMERICAN: 36 mL/min — AB (ref >60)
GLUCOSE: 150 mg/dL — AB (ref 65–99)
POTASSIUM: 4.3 mmol/L (ref 3.5–5.1)
SODIUM: 141 mmol/L (ref 135–145)
Total Bilirubin: 1 mg/dL (ref 0.3–1.2)
Total Protein: 5.6 g/dL — ABNORMAL LOW (ref 6.5–8.1)

## 2015-10-07 LAB — MAGNESIUM: Magnesium: 1.9 mg/dL (ref 1.7–2.4)

## 2015-10-07 LAB — MRSA PCR SCREENING: MRSA by PCR: NEGATIVE

## 2015-10-07 MED ORDER — PREDNISONE 5 MG PO TABS
5.0000 mg | ORAL_TABLET | Freq: Every day | ORAL | Status: DC
  Administered 2015-10-07 – 2015-10-15 (×9): 5 mg via ORAL
  Filled 2015-10-07 (×9): qty 1

## 2015-10-07 MED ORDER — METOPROLOL SUCCINATE ER 50 MG PO TB24
50.0000 mg | ORAL_TABLET | Freq: Two times a day (BID) | ORAL | Status: DC
  Administered 2015-10-07 – 2015-10-15 (×17): 50 mg via ORAL
  Filled 2015-10-07 (×17): qty 1

## 2015-10-07 MED ORDER — HYDROCODONE-ACETAMINOPHEN 5-325 MG PO TABS
1.0000 | ORAL_TABLET | Freq: Four times a day (QID) | ORAL | Status: DC | PRN
  Administered 2015-10-07 – 2015-10-13 (×14): 1 via ORAL
  Filled 2015-10-07 (×14): qty 1

## 2015-10-07 MED ORDER — NITROGLYCERIN 0.4 MG SL SUBL: 0.4000 mg | SUBLINGUAL_TABLET | SUBLINGUAL | Status: DC | PRN

## 2015-10-07 MED ORDER — GABAPENTIN 400 MG PO CAPS
800.0000 mg | ORAL_CAPSULE | Freq: Every day | ORAL | Status: DC
  Administered 2015-10-07 – 2015-10-14 (×9): 800 mg via ORAL
  Filled 2015-10-07 (×10): qty 2

## 2015-10-07 MED ORDER — VITAMIN D 1000 UNITS PO TABS
1000.0000 [IU] | ORAL_TABLET | Freq: Every day | ORAL | Status: DC
  Administered 2015-10-07 – 2015-10-14 (×8): 1000 [IU] via ORAL
  Filled 2015-10-07 (×9): qty 1

## 2015-10-07 MED ORDER — VITAMIN B-12 1000 MCG PO TABS
500.0000 ug | ORAL_TABLET | Freq: Every day | ORAL | Status: DC
  Administered 2015-10-07 – 2015-10-15 (×9): 500 ug via ORAL
  Filled 2015-10-07 (×10): qty 1

## 2015-10-07 MED ORDER — VANCOMYCIN HCL 500 MG IV SOLR
500.0000 mg | INTRAVENOUS | Status: DC
  Administered 2015-10-07 – 2015-10-10 (×4): 500 mg via INTRAVENOUS
  Filled 2015-10-07 (×5): qty 500

## 2015-10-07 MED ORDER — SODIUM CHLORIDE 0.9% FLUSH
3.0000 mL | Freq: Two times a day (BID) | INTRAVENOUS | Status: DC
  Administered 2015-10-07 – 2015-10-15 (×11): 3 mL via INTRAVENOUS

## 2015-10-07 MED ORDER — ESTROGENS, CONJUGATED 0.625 MG/GM VA CREA
1.0000 | TOPICAL_CREAM | VAGINAL | Status: DC
  Administered 2015-10-07 – 2015-10-09 (×2): 1 via VAGINAL
  Filled 2015-10-07: qty 30

## 2015-10-07 MED ORDER — ISOSORBIDE MONONITRATE ER 30 MG PO TB24
15.0000 mg | ORAL_TABLET | Freq: Every day | ORAL | Status: DC
  Administered 2015-10-07 – 2015-10-15 (×9): 15 mg via ORAL
  Filled 2015-10-07 (×9): qty 1

## 2015-10-07 MED ORDER — CITALOPRAM HYDROBROMIDE 20 MG PO TABS
20.0000 mg | ORAL_TABLET | Freq: Every day | ORAL | Status: DC
  Administered 2015-10-07 – 2015-10-15 (×9): 20 mg via ORAL
  Filled 2015-10-07 (×9): qty 1

## 2015-10-07 MED ORDER — CALCIUM CARBONATE-VITAMIN D 500-200 MG-UNIT PO TABS
1.0000 | ORAL_TABLET | Freq: Two times a day (BID) | ORAL | Status: DC
  Administered 2015-10-07 – 2015-10-15 (×16): 1 via ORAL
  Filled 2015-10-07 (×16): qty 1

## 2015-10-07 MED ORDER — CALCIUM CITRATE-VITAMIN D 315-200 MG-UNIT PO TABS: 1.0000 | ORAL_TABLET | Freq: Two times a day (BID) | ORAL | Status: DC

## 2015-10-07 MED ORDER — NYSTATIN 100000 UNIT/ML MT SUSP
5.0000 mL | Freq: Four times a day (QID) | OROMUCOSAL | Status: DC
  Administered 2015-10-07 – 2015-10-15 (×26): 500000 [IU] via ORAL
  Filled 2015-10-07 (×28): qty 5

## 2015-10-07 MED ORDER — FOLIC ACID 0.5 MG HALF TAB
400.0000 ug | ORAL_TABLET | Freq: Every morning | ORAL | Status: DC
  Administered 2015-10-07 – 2015-10-08 (×2): 0.5 mg via ORAL
  Filled 2015-10-07 (×3): qty 1

## 2015-10-07 MED ORDER — SODIUM CHLORIDE 0.9% FLUSH: 3.0000 mL | INTRAVENOUS | Status: DC | PRN

## 2015-10-07 MED ORDER — MAGNESIUM SULFATE 50 % IJ SOLN: 2.0000 g | Freq: Once | INTRAVENOUS | Status: DC

## 2015-10-07 MED ORDER — POTASSIUM CHLORIDE CRYS ER 20 MEQ PO TBCR
20.0000 meq | EXTENDED_RELEASE_TABLET | Freq: Every day | ORAL | Status: DC
  Administered 2015-10-07 – 2015-10-15 (×9): 20 meq via ORAL
  Filled 2015-10-07 (×9): qty 1

## 2015-10-07 MED ORDER — PANTOPRAZOLE SODIUM 40 MG PO TBEC
40.0000 mg | DELAYED_RELEASE_TABLET | Freq: Two times a day (BID) | ORAL | Status: DC
  Administered 2015-10-07 – 2015-10-15 (×17): 40 mg via ORAL
  Filled 2015-10-07 (×17): qty 1

## 2015-10-07 MED ORDER — RENA-VITE PO TABS
1.0000 | ORAL_TABLET | Freq: Every day | ORAL | Status: DC
  Administered 2015-10-07 – 2015-10-15 (×9): 1 via ORAL
  Filled 2015-10-07 (×9): qty 1

## 2015-10-07 MED ORDER — MAGNESIUM OXIDE 400 (241.3 MG) MG PO TABS
400.0000 mg | ORAL_TABLET | Freq: Every day | ORAL | Status: DC
  Administered 2015-10-07 – 2015-10-15 (×9): 400 mg via ORAL
  Filled 2015-10-07 (×9): qty 1

## 2015-10-07 MED ORDER — PIPERACILLIN-TAZOBACTAM 3.375 G IVPB
3.3750 g | Freq: Three times a day (TID) | INTRAVENOUS | Status: DC
  Administered 2015-10-07 – 2015-10-11 (×14): 3.375 g via INTRAVENOUS
  Filled 2015-10-07 (×16): qty 50

## 2015-10-07 MED ORDER — APIXABAN 2.5 MG PO TABS
2.5000 mg | ORAL_TABLET | Freq: Two times a day (BID) | ORAL | Status: DC
  Administered 2015-10-07 – 2015-10-09 (×6): 2.5 mg via ORAL
  Filled 2015-10-07 (×6): qty 1

## 2015-10-07 MED ORDER — ENOXAPARIN SODIUM 40 MG/0.4ML ~~LOC~~ SOLN: 40.0000 mg | SUBCUTANEOUS | Status: DC

## 2015-10-07 MED ORDER — FUROSEMIDE 40 MG PO TABS
40.0000 mg | ORAL_TABLET | Freq: Every day | ORAL | Status: DC
  Administered 2015-10-07 – 2015-10-09 (×3): 40 mg via ORAL
  Filled 2015-10-07 (×3): qty 1

## 2015-10-07 MED ORDER — SUCRALFATE 1 GM/10ML PO SUSP
1.0000 g | Freq: Two times a day (BID) | ORAL | Status: DC
  Administered 2015-10-07 – 2015-10-15 (×18): 1 g via ORAL
  Filled 2015-10-07 (×18): qty 10

## 2015-10-07 MED ORDER — SODIUM CHLORIDE 0.9 % IV SOLN
250.0000 mL | INTRAVENOUS | Status: DC | PRN
  Administered 2015-10-07: 250 mL via INTRAVENOUS

## 2015-10-07 MED ORDER — FENTANYL 50 MCG/HR TD PT72
75.0000 ug | MEDICATED_PATCH | TRANSDERMAL | Status: DC
  Administered 2015-10-08 – 2015-10-11 (×2): 75 ug via TRANSDERMAL
  Filled 2015-10-07 (×2): qty 1

## 2015-10-07 MED ORDER — MAGNESIUM SULFATE 2 GM/50ML IV SOLN
2.0000 g | Freq: Once | INTRAVENOUS | Status: AC
  Administered 2015-10-07: 2 g via INTRAVENOUS
  Filled 2015-10-07: qty 50

## 2015-10-07 NOTE — Consult Note (Signed)
WOC wound consult note Reason for Consult: LLE wound, patient reports she is WC bound and she pulled a drawer out and bumped this area. It has not healed.  Palpable pulses, but patient is non ambulatory. Wound type: trauma  Measurement: 3.0cm x 1.5cm x 0.1cm  Wound [id number] with skin flap intact Drainage (amount, consistency, odor) serosanguinous  Periwound: macerated, 2+ pitting edema  Dressing procedure/placement/frequency: Silver hydrofiber to the wound bed for exudate, ACE wrap for light compression to lessen edema. Change daily.   Discussed POC with patient and bedside nurse.  Re consult if needed, will not follow at this time. Thanks  Sanah Kraska Kellogg, [place] [phone])

## 2015-10-07 NOTE — Progress Notes (Signed)
Pharmacy Antibiotic Note  Nicole Roberts is a 82 y.o. female admitted on 10/06/2015 with cellulitis.  Pharmacy has been consulted for vancomycin and zosyn dosing. Vanc 1 gm and zosyn 3.375 gm given in ED 6/6 at 2100.  Wt 68.6 kg, WBC WNL, ANC 8.3, creat 1.5. Lactate WNL. Tmax 99. Creat cl ~ 28 ml/min. She was on keflex PTA.   Plan: Vancomycin 500 IV every 24 hours.  Goal trough 10-15 mcg/mL. Zosyn 3.375g IV q8h (4 hour infusion).  Height: 5\' 4"  (162.6 cm) Weight: 151 lb 3.8 oz (68.6 kg) IBW/kg (Calculated) : 54.7  Temp (24hrs), Avg:98.5 F (36.9 C), Min:97.9 F (36.6 C), Max:99 F (37.2 C)   Recent Labs Lab 10/06/15 2116 10/06/15 2140 10/06/15 2336  WBC 10.5  --   --   CREATININE 1.50*  --   --   LATICACIDVEN  --  1.35 1.50    Estimated Creatinine Clearance: 23.3 mL/min (by C-G formula based on Cr of 1.5).    Allergies  Allergen Reactions  . Sulfa Antibiotics Itching  . Tramadol Hcl Itching and Rash    Antimicrobials this admission: vanc 6/6>> Zosyn 6/6>>  Thank you for allowing pharmacy to be a part of this patient's care.  Eudelia Bunch, Pharm.D. [id number] 10/07/2015 1:17 AM

## 2015-10-07 NOTE — Progress Notes (Addendum)
[facility]                                                                              Patient Demographics  Nicole Roberts, is a 82 y.o. female, DOB - [birth year], [id number]  Admit date - 10/06/2015   Admitting Physician Phillips Grout, MD  Outpatient Primary MD for the patient is [name], MD  Outpatient specialists:   LOS - 1  days    Chief Complaint  Patient presents with  . Leg Pain  . Cellulitis       Brief summary   Patient is a 82 year old from [facility] with history of atrial fibrillation, hypertension and hyperlipidemia, GERD, hypothyroidism, anemia, TIA presented to ED with cellulitis, leg pain. Patient apparently fell and hit her leg over 3 weeks ago and developed a skin tear. About a week prior to admission he started to get more red and erythematous. She was started on Keflex 2 days prior to admission. The redness worsened despite antibiotics over to the anterior leg up to the knee. No fevers. Patient was admitted for cellulitis.   Assessment & Plan    Principal Problem:   Cellulitis - Outpatient failure to Keflex - Patient was placed on IV vancomycin and Zosyn - Wound care consult  Active Problems:   Essential hypertension - Currently controlled - Continue furosemide, Imdur, metoprolol    Rheumatoid arthritis (HCC) - On chronic steroids    Atrial fibrillation (HCC) - Currently rate controlled -Continue apixaban    Mild acute on chronic Stage III chronic kidney disease - Baseline creatinine 1.3-1.4    Chronic combined systolic and diastolic CHF (congestive heart failure) (HCC) -Currently compensated   Acute on chronic back pain, recent fall, radiating to legs - cannot have MRI due to pacer, ordered CT lumbar spine  - CMET, mag level  Code Status: dnr  DVT Prophylaxis: apixaban   Family Communication: Discussed in detail with the patient, all imaging results, lab results explained to  the patient and son in detail   Disposition Plan:   Time Spent in minutes   25 minutes  Procedures:  None   Consultants:   None   Antimicrobials:   IV vancomycin 6/6  IV Zosyn 6/7    Medications  Scheduled Meds: . apixaban  2.5 mg Oral BID  . cholecalciferol  1,000 Units Oral Daily  . citalopram  20 mg Oral Daily  . conjugated estrogens  1 Applicatorful Vaginal Once per day on Mon Wed Fri  . cyanocobalamin  500 mcg Oral Daily  . [START ON 10/08/2015] fentaNYL  75 mcg Transdermal Q72H  . folic acid  A999333 mcg Oral q morning - 10a  . furosemide  40 mg Oral Daily  . gabapentin  800 mg Oral QHS  . isosorbide mononitrate  15 mg Oral Daily  . magnesium oxide  400 mg Oral Daily  . metoprolol succinate  50 mg Oral BID  . multivitamin  1 tablet Oral Daily  . nystatin  5 mL Oral QID  . pantoprazole  40 mg Oral BID  . piperacillin-tazobactam (ZOSYN)  IV  3.375 g  Intravenous Q8H  . potassium chloride SA  20 mEq Oral Daily  . predniSONE  5 mg Oral Q breakfast  . sodium chloride flush  3 mL Intravenous Q12H  . sucralfate  1 g Oral BID  . vancomycin  500 mg Intravenous Q24H   Continuous Infusions:  PRN Meds:.sodium chloride, HYDROcodone-acetaminophen, nitroGLYCERIN, sodium chloride flush   Antibiotics   Anti-infectives    Start     Dose/Rate Route Frequency Ordered Stop   10/07/15 2000  vancomycin (VANCOCIN) 500 mg in sodium chloride 0.9 % 100 mL IVPB     500 mg 100 mL/hr over 60 Minutes Intravenous Every 24 hours 10/07/15 0119     10/07/15 0400  piperacillin-tazobactam (ZOSYN) IVPB 3.375 g     3.375 g 12.5 mL/hr over 240 Minutes Intravenous Every 8 hours 10/07/15 0119     10/06/15 2045  piperacillin-tazobactam (ZOSYN) IVPB 3.375 g     3.375 g 100 mL/hr over 30 Minutes Intravenous  Once 10/06/15 2039 10/07/15 0018   10/06/15 2045  vancomycin (VANCOCIN) IVPB 1000 mg/200 mL premix     1,000 mg 200 mL/hr over 60 Minutes Intravenous  Once 10/06/15 2039 10/07/15 0018         Subjective:   Nicole Roberts was seen and examined today.Still has significant erythema on the left leg. Multiple wounds on the right leg as well the dressings on.  Patient denies dizziness, chest pain, shortness of breath, abdominal pain, N/V/D/C, new weakness, numbess, tingling. No acute events overnight.    Objective:   Filed Vitals:   10/07/15 0053 10/07/15 0055 10/07/15 0447 10/07/15 0500  BP:  120/59 122/55   Pulse:  87 85   Temp:  97.9 F (36.6 C) 98 F (36.7 C)   TempSrc:  Oral    Resp:  18 18   Height: 5\' 4"  (1.626 m)     Weight: 68.6 kg (151 lb 3.8 oz)   68.9 kg (151 lb 14.4 oz)  SpO2:  100% 96%     Intake/Output Summary (Last 24 hours) at 10/07/15 1202 Last data filed at 10/07/15 0500  Gross per 24 hour  Intake   53.5 ml  Output      0 ml  Net   53.5 ml     Wt Readings from Last 3 Encounters:  10/07/15 68.9 kg (151 lb 14.4 oz)  08/10/15 70.217 kg (154 lb 12.8 oz)  06/08/15 74.844 kg (165 lb)     Exam  General: Alert and oriented x 3, NAD  HEENT:  PERRLA, EOMI, Anicteric Sclera, mucous membranes moist.   Neck: Supple, no JVD, no masses  Cardiovascular: S1 S2 auscultated, no rubs, murmurs or gallops. Regular rate and rhythm.  Respiratory: Clear to auscultation bilaterally, no wheezing, rales or rhonchi  Gastrointestinal: Soft, nontender, nondistended, + bowel sounds  Ext: no cyanosis clubbing or edema  Neuro: AAOx3, Cr N's II- XII. Strength 5/5 upper and lower extremities bilaterally  Skin: cellulitis on the left lower extremity with small wound, dressing intact on the right lower extremity  Psych: Normal affect and demeanor, alert and oriented x3    Data Reviewed:  I have personally reviewed following labs and imaging studies  Micro Results Recent Results (from the past 240 hour(s))  MRSA PCR Screening     Status: None   Collection Time: 10/07/15  1:08 AM  Result Value Ref Range Status   MRSA by PCR NEGATIVE NEGATIVE Final     Comment:  The GeneXpert MRSA Assay (FDA approved for NASAL specimens only), is one component of a comprehensive MRSA colonization surveillance program. It is not intended to diagnose MRSA infection nor to guide or monitor treatment for MRSA infections.     Radiology Reports Dg Tibia/fibula Left  10/06/2015  CLINICAL DATA:  Fall 10 days ago. Skin tear with possible infection. Initial encounter. EXAM: LEFT TIBIA AND FIBULA - 2 VIEW COMPARISON:  None. FINDINGS: Soft tissue swelling greatest about the lateral lower leg. There is no soft tissue gas or opaque foreign body. Negative for fracture or signs of osteomyelitis. Osteopenia and knee osteoarthritis. Arterial and soft tissue calcifications. IMPRESSION: Soft tissue swelling without opaque foreign body, soft tissue emphysema, or evidence of osteomyelitis. Electronically Signed   By: Monte Fantasia M.D.   On: 10/06/2015 22:00    Lab Data:  CBC:  Recent Labs Lab 10/06/15 2116  WBC 10.5  NEUTROABS 8.3*  HGB 11.3*  HCT 35.5*  MCV 110.6*  PLT 99991111   Basic Metabolic Panel:  Recent Labs Lab 10/06/15 2116  NA 140  K 4.5  CL 102  CO2 29  GLUCOSE 85  BUN 31*  CREATININE 1.50*  CALCIUM 9.0   GFR: Estimated Creatinine Clearance: 23.3 mL/min (by C-G formula based on Cr of 1.5). Liver Function Tests:  Recent Labs Lab 10/06/15 2116  AST 27  ALT 28  ALKPHOS 181*  BILITOT 0.9  PROT 6.2*  ALBUMIN 2.8*   No results for input(s): LIPASE, AMYLASE in the last 168 hours. No results for input(s): AMMONIA in the last 168 hours. Coagulation Profile: No results for input(s): INR, PROTIME in the last 168 hours. Cardiac Enzymes: No results for input(s): CKTOTAL, CKMB, CKMBINDEX, TROPONINI in the last 168 hours. BNP (last 3 results)  Recent Labs  03/18/15 1643  PROBNP 288.0*   HbA1C: No results for input(s): HGBA1C in the last 72 hours. CBG: No results for input(s): GLUCAP in the last 168 hours. Lipid Profile: No  results for input(s): CHOL, HDL, LDLCALC, TRIG, CHOLHDL, LDLDIRECT in the last 72 hours. Thyroid Function Tests: No results for input(s): TSH, T4TOTAL, FREET4, T3FREE, THYROIDAB in the last 72 hours. Anemia Panel: No results for input(s): VITAMINB12, FOLATE, FERRITIN, TIBC, IRON, RETICCTPCT in the last 72 hours. Urine analysis:    Component Value Date/Time   COLORURINE YELLOW 03/18/2015 1643   APPEARANCEUR CLEAR 03/18/2015 1643   LABSPEC 1.010 03/18/2015 1643   PHURINE 6.0 03/18/2015 1643   GLUCOSEU NEGATIVE 03/18/2015 1643   GLUCOSEU NEGATIVE 01/23/2015 1606   HGBUR NEGATIVE 03/18/2015 1643   HGBUR large 01/26/2010 1104   BILIRUBINUR NEGATIVE 03/18/2015 [place] 03/18/2015 1643   PROTEINUR NEGATIVE 01/23/2015 1606   UROBILINOGEN 0.2 03/18/2015 1643   NITRITE NEGATIVE 03/18/2015 [place] 03/18/2015 [place] M.D. [facility] 10/07/2015, 12:02 PM  Pager: [phone] Between 7am to 7pm - call Pager - [phone]  After 7pm go to [email or web address] - password [email or web address]  Call night coverage person covering after 7pm

## 2015-10-07 NOTE — H&P (Signed)
PCP:   [name], MD   Chief Complaint:  Leg pain  HPI: 82 yo female fell and hit her leg over 3 weeks ago and developed a skin tear.  About a week ago it started to get a little red.  She was started on keflex 2 days ago.  It has gotten worse and the redness is now over the anterior leg leg up past the knee.  No fevers.  It has been painful.  Pt referred for admission for cellulitis.  Review of Systems:  Positive and negative as per HPI otherwise all other systems are negative  Past Medical History: Past Medical History  Diagnosis Date  . HEARING LOSS   . MITRAL VALVE INSUFF&AORTIC VALVE INSUFF     moderate MR  . PULMONARY HYPERTENSION   . Atrial fibrillation ([place])     permanent  . SICK SINUS SYNDROME     a. Tachybrady syndrome - [facility] PPM 10/2005.  . Edema 03/19/2009    due to venous insufficiency, chronic lymphedema  . URINARY INCONTINENCE   . Rheumatoid arthritis(714.0) dx clarified 2011    On prednisone  . BREAST CANCER 12/2006    ductal ca s/p L lumpectomy  . Diastolic dysfunction   . HYPERTENSION   . HYPERLIPIDEMIA   . GERD   . HYPOTHYROIDISM   . Hyperparathyroidism     2 lobes removed  . Venous insufficiency     chronic BLE edema  . Spinal stenosis   . Anemia     a. Macrocytic - normal B12, folate 08/2011. b. 3/6 heme positive stools 10/2011 - per PCP note, elected against colonscopy.  [name] TIA (transient ischemic attack) 05/2011  . Anxiety    Past Surgical History  Procedure Laterality Date  . Pacemaker placement  2005    SSS and syncope  . Cholecystectomy  04/06/99  . Abdominal hysterectomy  1970    Partial  . Left hip arthroscopy  1995  . Right hip arthroscopy  01/2001  . Carpal tunnel release  1998    bilateral  . Breast surgery  12/2006    Left breast lupectomy- Ductal CA  . Left parathyroidectomy  07/2008  . Esophagogastroduodenoscopy N/A 10/18/2012    Procedure: ESOPHAGOGASTRODUODENOSCOPY (EGD);  Surgeon: Ladene Artist, MD;  Location: [facility]  ENDOSCOPY;  Service: Endoscopy;  Laterality: N/A;  . Esophagogastroduodenoscopy (egd) with propofol N/A 01/29/2015    Procedure: ESOPHAGOGASTRODUODENOSCOPY (EGD) WITH PROPOFOL;  Surgeon: Inda Castle, MD;  Location: [facility] ENDOSCOPY;  Service: Endoscopy;  Laterality: N/A;  . Ercp N/A 01/29/2015    Procedure: ENDOSCOPIC RETROGRADE CHOLANGIOPANCREATOGRAPHY (ERCP);  Surgeon: Inda Castle, MD;  Location: [facility] ENDOSCOPY;  Service: Endoscopy;  Laterality: N/A;    Medications: Prior to Admission medications   Medication Sig Start Date End Date Taking? Authorizing Provider  apixaban (ELIQUIS) 2.5 MG TABS tablet Take 1 tablet (2.5 mg total) by mouth 2 (two) times daily. 02/04/15  Yes Charlynne Cousins, MD  calcium citrate-vitamin D (CITRACAL+D) 315-200 MG-UNIT tablet Take 1 tablet by mouth 2 (two) times daily. Patient taking differently: Take 1 tablet by mouth 2 (two) times daily. Gummies 08/24/15  Yes [name], MD  cephALEXin (KEFLEX) 500 MG capsule Take 1 capsule (500 mg total) by mouth 2 (two) times daily. 10/05/15  Yes Lucretia Kern, DO  cholecalciferol (VITAMIN D) 1000 UNITS tablet Take 1,000 Units by mouth daily.   Yes Historical Provider, MD  citalopram (CELEXA) 20 MG tablet Take 20 mg by mouth daily.  Yes Historical Provider, MD  conjugated estrogens (PREMARIN) vaginal cream Place 1 Applicatorful vaginally 3 (three) times a week.   Yes Historical Provider, MD  fentaNYL (DURAGESIC - DOSED MCG/HR) 75 MCG/HR Place 1 patch (75 mcg total) onto the skin every 3 (three) days. 09/14/15  Yes [name], MD  folic acid (FOLVITE) A999333 MCG tablet Take 400 mcg by mouth every morning.   Yes Historical Provider, MD  furosemide (LASIX) 20 MG tablet Take 2 tablets (40 mg total) by mouth daily. Until directed to decrease back to 20 mg daily 03/18/15  Yes [name], MD  gabapentin (NEURONTIN) 400 MG capsule Take 2 capsules (800 mg total) by mouth at bedtime. 12/02/14  Yes Rowe Clack, MD   HYDROcodone-acetaminophen (NORCO/VICODIN) 5-325 MG tablet Take 1 tablet by mouth every 6 (six) hours as needed for moderate pain.   Yes Historical Provider, MD  isosorbide mononitrate (IMDUR) 30 MG 24 hr tablet Take 15 mg by mouth daily.   Yes Historical Provider, MD  levothyroxine (SYNTHROID, LEVOTHROID) 50 MCG tablet Take 1 tablet (50 mcg total) by mouth daily. Patient taking differently: Take 50 mcg by mouth daily before breakfast.  06/09/14  Yes Rowe Clack, MD  magnesium oxide (MAG-OX) 400 MG tablet Take 400 mg by mouth daily.   Yes Historical Provider, MD  metoprolol succinate (TOPROL-XL) 50 MG 24 hr tablet Take 1 tablet (50 mg total) by mouth 2 (two) times daily. Take with or immediately following a meal. 06/02/14  Yes Rowe Clack, MD  Multiple Vitamin (DAILY-VITE) TABS TAKE ONE TABLET BY MOUTH ONCE DAILY. 07/17/15  Yes [name], MD  nitroGLYCERIN (NITROSTAT) 0.4 MG SL tablet Place 0.4 mg under the tongue every 5 (five) minutes x 3 doses as needed for chest pain. 03/20/12  Yes Roger A Arguello, PA-C  nystatin (MYCOSTATIN) 100000 UNIT/ML suspension Take 5 mLs (500,000 Units total) by mouth 4 (four) times daily. Use for two days after symptoms have resolved and then stop 09/21/15  Yes [name], MD  pantoprazole (PROTONIX) 40 MG tablet Take 1 tablet (40 mg total) by mouth 2 (two) times daily. 08/24/15  Yes [name], MD  potassium chloride SA (K-DUR,KLOR-CON) 20 MEQ tablet TAKE ONE TABLET BY MOUTH ONCE DAILY. 06/26/15  Yes [name], MD  predniSONE (DELTASONE) 5 MG tablet Take 5 mg by mouth daily with breakfast.   Yes Historical Provider, MD  sucralfate (CARAFATE) 1 GM/10ML suspension Take 1 g by mouth 2 (two) times daily.   Yes Historical Provider, MD  vitamin B-12 (CYANOCOBALAMIN) 500 MCG tablet TAKE ONE TABLET BY MOUTH ONCE DAILY. 11/20/13  Yes Rowe Clack, MD    Allergies:   Allergies  Allergen Reactions  . Sulfa Antibiotics Itching  . Tramadol Hcl Itching  and Rash    Social History:  reports that she has never smoked. She has never used smokeless tobacco. She reports that she does not drink alcohol or use illicit drugs.  Family History: Family History  Problem Relation Age of Onset  . Ovarian cancer Mother   . Coronary artery disease Sister   . Coronary artery disease Brother   . Hypertension Mother     grandparent  . Lung cancer Father     Physical Exam: Filed Vitals:   10/06/15 2245 10/06/15 2300 10/06/15 2315 10/06/15 2330  BP: 135/86 123/96 136/124 127/75  Pulse: 92 86 87 89  Temp:      TempSrc:  Resp:  17 15 13   SpO2: 98% 99% 96% 99%   General appearance: alert, cooperative and no distress Head: Normocephalic, without obvious abnormality, atraumatic Eyes: negative Nose: Nares normal. Septum midline. Mucosa normal. No drainage or sinus tenderness. Neck: no JVD and supple, symmetrical, trachea midline Lungs: clear to auscultation bilaterally Heart: regular rate and rhythm, S1, S2 normal, no murmur, click, rub or gallop Abdomen: soft, non-tender; bowel sounds normal; no masses,  no organomegaly Extremities: extremities normal, atraumatic, no cyanosis or edema Pulses: 2+ and symmetric Skin: Skin color, texture, turgor normal. No rashes or lesions x lle cellulitis up past knee Neurologic: Grossly normal    Labs on Admission:   Recent Labs  10/06/15 2116  NA 140  K 4.5  CL 102  CO2 29  GLUCOSE 85  BUN 31*  CREATININE 1.50*  CALCIUM 9.0    Recent Labs  10/06/15 2116  AST 27  ALT 28  ALKPHOS 181*  BILITOT 0.9  PROT 6.2*  ALBUMIN 2.8*     Recent Labs  10/06/15 2116  WBC 10.5  NEUTROABS 8.3*  HGB 11.3*  HCT 35.5*  MCV 110.6*  PLT 190    Radiological Exams on Admission: Dg Tibia/fibula Left  10/06/2015  CLINICAL DATA:  Fall 10 days ago. Skin tear with possible infection. Initial encounter. EXAM: LEFT TIBIA AND FIBULA - 2 VIEW COMPARISON:  None. FINDINGS: Soft tissue swelling greatest about  the lateral lower leg. There is no soft tissue gas or opaque foreign body. Negative for fracture or signs of osteomyelitis. Osteopenia and knee osteoarthritis. Arterial and soft tissue calcifications. IMPRESSION: Soft tissue swelling without opaque foreign body, soft tissue emphysema, or evidence of osteomyelitis. Electronically Signed   By: Monte Fantasia M.D.   On: 10/06/2015 22:00    Assessment/Plan  82 yo female with lle cellulitis failing outpatient treatment with keflex  Principal Problem:   Cellulitis-  Iv vanc/zosyn.  Should improve in the next 24 to 48 hours.  Active Problems:   Essential hypertension- noted   Rheumatoid arthritis ([place])- on chronic steroids, continue   Atrial fibrillation (HCC)- rate controlled   Stage III chronic kidney disease-stable   Chronic combined systolic and diastolic CHF (congestive heart failure) ([place])- compensated, stable  Admit to floor.  DNR.  Jaymason Ledesma A 10/07/2015, 12:17 AM

## 2015-10-07 NOTE — Care Management Note (Signed)
Case Management Note  Patient Details  Name: Nicole Roberts MRN: [id number] Date of Birth: Jul 22, 1921  Subjective/Objective:               Admitted with L LEcellulitis. WC bound.From [facility]. PCP: Celso Amy.   Action/Plan: Return to home when medically stable. CM to f/u with disposition needs.  Expected Discharge Date:                   Expected Discharge Plan:  [facility]  In-House Referral:  Clinical Social Work  Discharge planning Services  CM Consult  Post Acute Care Choice:    Choice offered to:     DME Arranged:    DME Agency:     HH Arranged:    [place] Agency:     Status of Service:  In process, will continue to follow  Medicare Important Message Given:    Date Medicare IM Given:    Medicare IM give by:    Date Additional Medicare IM Given:    Additional Medicare Important Message give by:     If discussed at [place] of Stay Meetings, dates discussed:    Additional Comments:  Sharin Mons, [place] [id number] 10/07/2015, 9:59 PM

## 2015-10-07 NOTE — Progress Notes (Addendum)
Nicole Roberts is a 82 y.o. female patient admitted from ED awake, alert - oriented  X 4 - no acute distress noted.  VSS - Blood pressure 120/59, pulse 87, temperature 97.9 F (36.6 C), temperature source Oral, resp. rate 18, height 5\' 4"  (1.626 m), weight 68.6 kg (151 lb 3.8 oz), SpO2 100 %.    IV in place, occlusive dsg intact without redness.  Orientation to room, and floor completed with information packet given to patient.  Patient declined safety video at this time.  Admission INP armband ID verified with patient/family, and in place.  SR up x 2, fall assessment complete, with patient able to verbalize understanding of risk associated with falls, and verbalized understanding to call nsg before up out of bed.  Call light within reach, patient able to voice, and demonstrate understanding.  Cellulitis and skin tear noted on left lower extremity with an non-blanchable area of redness on right heel, foam applied. No other skin break down evident upon assessment.     Will continue to evaluate and treat per MD orders.  Elon Jester, RN 10/07/2015 12:45 AM

## 2015-10-08 LAB — BASIC METABOLIC PANEL
ANION GAP: 7 (ref 5–15)
BUN: 22 mg/dL — ABNORMAL HIGH (ref 6–20)
CO2: 30 mmol/L (ref 22–32)
Calcium: 8.6 mg/dL — ABNORMAL LOW (ref 8.9–10.3)
Chloride: 105 mmol/L (ref 101–111)
Creatinine, Ser: 1.3 mg/dL — ABNORMAL HIGH (ref 0.44–1.00)
GFR, EST AFRICAN AMERICAN: 40 mL/min — AB (ref >60)
GFR, EST NON AFRICAN AMERICAN: 35 mL/min — AB (ref >60)
GLUCOSE: 95 mg/dL (ref 65–99)
POTASSIUM: 3.6 mmol/L (ref 3.5–5.1)
SODIUM: 142 mmol/L (ref 135–145)

## 2015-10-08 LAB — CBC
HEMATOCRIT: 32.9 % — AB (ref 36.0–46.0)
HEMOGLOBIN: 10.4 g/dL — AB (ref 12.0–15.0)
MCH: 35.4 pg — AB (ref 26.0–34.0)
MCHC: 31.6 g/dL (ref 30.0–36.0)
MCV: 111.9 fL — ABNORMAL HIGH (ref 78.0–100.0)
PLATELETS: 146 10*3/uL — AB (ref 150–400)
RBC: 2.94 MIL/uL — ABNORMAL LOW (ref 3.87–5.11)
RDW: 15.9 % — ABNORMAL HIGH (ref 11.5–15.5)
WBC: 11 10*3/uL — AB (ref 4.0–10.5)

## 2015-10-08 LAB — PROTIME-INR
INR: 1.27 (ref 0.00–1.49)
Prothrombin Time: 16.1 seconds — ABNORMAL HIGH (ref 11.6–15.2)

## 2015-10-08 LAB — APTT: aPTT: 29 seconds (ref 24–37)

## 2015-10-08 NOTE — Progress Notes (Signed)
Patient ID: Nicole Roberts, female   DOB: [birth year], 82 y.o.   MRN: [id number]   Request received for sacroplasty  Dr Estanislado Pandy has reviewed imaging and approves procedure  Actively on Eliquis  Will need off 48 hrs----must schedule for Mon 6/12  IR PA will see pt 6/9 for note/consent and orders

## 2015-10-08 NOTE — Progress Notes (Signed)
  PHARMACY - PHYSICIAN COMMUNICATION CRITICAL VALUE ALERT - BLOOD CULTURE IDENTIFICATION (BCID)  Results for orders placed or performed during the hospital encounter of 10/06/15  Blood Culture ID Panel (Reflexed) (Collected: 10/06/2015  9:38 PM)  Result Value Ref Range   Enterococcus species NOT DETECTED NOT DETECTED   Vancomycin resistance NOT DETECTED NOT DETECTED   Listeria monocytogenes NOT DETECTED NOT DETECTED   Staphylococcus species DETECTED (A) NOT DETECTED   Staphylococcus aureus NOT DETECTED NOT DETECTED   Methicillin resistance NOT DETECTED NOT DETECTED   Streptococcus species NOT DETECTED NOT DETECTED   Streptococcus agalactiae NOT DETECTED NOT DETECTED   Streptococcus pneumoniae NOT DETECTED NOT DETECTED   Streptococcus pyogenes NOT DETECTED NOT DETECTED   Acinetobacter baumannii NOT DETECTED NOT DETECTED   Enterobacteriaceae species NOT DETECTED NOT DETECTED   Enterobacter cloacae complex NOT DETECTED NOT DETECTED   Escherichia coli NOT DETECTED NOT DETECTED   Klebsiella oxytoca NOT DETECTED NOT DETECTED   Klebsiella pneumoniae NOT DETECTED NOT DETECTED   Proteus species NOT DETECTED NOT DETECTED   Serratia marcescens NOT DETECTED NOT DETECTED   Carbapenem resistance NOT DETECTED NOT DETECTED   Haemophilus influenzae NOT DETECTED NOT DETECTED   Neisseria meningitidis NOT DETECTED NOT DETECTED   Pseudomonas aeruginosa NOT DETECTED NOT DETECTED   Candida albicans NOT DETECTED NOT DETECTED   Candida glabrata NOT DETECTED NOT DETECTED   Candida krusei NOT DETECTED NOT DETECTED   Candida parapsilosis NOT DETECTED NOT DETECTED   Candida tropicalis NOT DETECTED NOT DETECTED    Name of physician (or Provider) Contacted: Dr. Tana Coast  Changes to prescribed antibiotics required: 1 out of 2 CoNS - consistent with contaminant. Pt already on Vanc + Zosyn for cellulitis. Please consider narrowing to Amoxicillin + Doxycycline or Augmentin monotherapy.   Cassie L. Nicole Kindred, PharmD PGY2  Infectious Diseases Pharmacy Resident Pager: [phone] 10/08/2015 11:40 AM

## 2015-10-08 NOTE — Clinical Social Work Note (Signed)
Clinical Social Work Assessment  Patient Details  Name: Nicole Roberts MRN: [id number] Date of Birth: November 09, 1921  Date of referral:  10/08/15               Reason for consult:  Facility Placement                Permission sought to share information with:  Facility Sport and exercise psychologist, Family Supports Permission granted to share information::  Yes, Verbal Permission Granted  Name::     Arnell Sieving  Agency::  [facility]  Relationship::  son  Contact Information:  [phone]  Housing/Transportation Living arrangements for the past 2 months:  [place] of Information:  Patient, Facility, Adult Children Patient Interpreter Needed:  None Criminal Activity/Legal Involvement Pertinent to Current Situation/Hospitalization:  No - Comment as needed Significant Relationships:  Adult Children Lives with:  Self Do you feel safe going back to the place where you live?  Yes Need for family participation in patient care:  Yes (Comment)  Care giving concerns:  CSW received referral for discharge planning. Patient stated she is from [facility] and plans to return there at discharge. CSW to continue to follow and assist with discharge planning needs.   Social Worker assessment / plan:  Patient will return to [facility] at discharge and gave CSW permission to contact her son.  Employment status:  Retired Forensic scientist:  Medicare PT Recommendations:  Not assessed at this time Information / Referral to community resources:     Patient/Family's Response to care: Patient expresses agreement with discharge plan and is eager to return to ALF and get some rest.    Patient/Family's Understanding of and Emotional Response to Diagnosis, Current Treatment, and Prognosis:  No questions or concerns reported.   Emotional Assessment Appearance:  Appears stated age Attitude/Demeanor/Rapport:  Other (Appropriate) Affect (typically observed):  Accepting,  Appropriate Orientation:  Oriented to  Time, Oriented to Place, Oriented to Self, Oriented to Situation Alcohol / Substance use:  Not Applicable Psych involvement (Current and /or in the community):  No (Comment)  Discharge Needs  Concerns to be addressed:  Care Coordination Readmission within the last 30 days:  No Current discharge risk:  None Barriers to Discharge:  Continued Medical Work up   [employer], [place] 10/08/2015, 4:29 PM

## 2015-10-08 NOTE — Progress Notes (Signed)
[facility]                                                                              Patient Demographics  Nicole Roberts, is a 82 y.o. female, DOB - 25-Mar-1922, [id number]  Admit date - 10/06/2015   Admitting Physician Phillips Grout, MD  Outpatient Primary MD for the patient is [name], MD  Outpatient specialists:   LOS - 2  days    Chief Complaint  Patient presents with  . Leg Pain  . Cellulitis       Brief summary   Patient is a 82 year old from [facility] with history of atrial fibrillation, hypertension and hyperlipidemia, GERD, hypothyroidism, anemia, TIA presented to ED with cellulitis, leg pain. Patient apparently fell and hit her leg over 3 weeks ago and developed a skin tear. About a week prior to admission he started to get more red and erythematous. She was started on Keflex 2 days prior to admission. The redness worsened despite antibiotics over to the anterior leg up to the knee. No fevers. Patient was admitted for cellulitis.   Assessment & Plan    Principal Problem:   Cellulitis- - Outpatient failure to Keflex - Patient was placed on IV vancomycin and Zosyn - Wound care consulted  Active Problems: Acute on chronic back pain, recent fall, radiating to legs Likely due to sacral fracture - CT of the lumbar spine showed left sacral fracture - Discussed in detail with the patient's son who states that Mr. Gollihar has been very debilitated since the fall 2 weeks ago, the pain has been radiating down to the left leg and in the right leg. IR consulted for kyphoplasty if possible - Also check Doppler ultrasound of the lower extremities to rule out DVT in both legs, swelling and multiple wounds    Essential hypertension - Currently controlled - Continue furosemide, Imdur, metoprolol    Rheumatoid arthritis ([place]) - On chronic steroids    Atrial fibrillation (HCC) - Currently rate controlled -Continue  apixaban    Mild acute on chronic Stage III chronic kidney disease - Baseline creatinine 1.3-1.4, currently at baseline    Chronic combined systolic and diastolic CHF (congestive heart failure) (HCC) -Currently compensated    Code Status: dnr  DVT Prophylaxis: apixaban   Family Communication: Discussed in detail with the patient, all imaging results, lab results explained to the patient and son in detail on phone   Disposition Plan:   Time Spent in minutes   25 minutes  Procedures:  CT lumbar spine  Consultants:   Interventional radiology  Antimicrobials:   IV vancomycin 6/6  IV Zosyn 6/7    Medications  Scheduled Meds: . apixaban  2.5 mg Oral BID  . calcium-vitamin D  1 tablet Oral BID  . cholecalciferol  1,000 Units Oral Daily  . citalopram  20 mg Oral Daily  . conjugated estrogens  1 Applicatorful Vaginal Once per day on Mon Wed Fri  . cyanocobalamin  500 mcg Oral Daily  . fentaNYL  75 mcg Transdermal Q72H  . folic acid  [id number] mcg Oral q morning - 10a  .  furosemide  40 mg Oral Daily  . gabapentin  800 mg Oral QHS  . isosorbide mononitrate  15 mg Oral Daily  . magnesium oxide  400 mg Oral Daily  . metoprolol succinate  50 mg Oral BID  . multivitamin  1 tablet Oral Daily  . nystatin  5 mL Oral QID  . pantoprazole  40 mg Oral BID  . piperacillin-tazobactam (ZOSYN)  IV  3.375 g Intravenous Q8H  . potassium chloride SA  20 mEq Oral Daily  . predniSONE  5 mg Oral Q breakfast  . sodium chloride flush  3 mL Intravenous Q12H  . sucralfate  1 g Oral BID  . vancomycin  500 mg Intravenous Q24H   Continuous Infusions:  PRN Meds:.sodium chloride, HYDROcodone-acetaminophen, nitroGLYCERIN, sodium chloride flush   Antibiotics   Anti-infectives    Start     Dose/Rate Route Frequency Ordered Stop   10/07/15 2000  vancomycin (VANCOCIN) 500 mg in sodium chloride 0.9 % 100 mL IVPB     500 mg 100 mL/hr over 60 Minutes Intravenous Every 24 hours 10/07/15 0119      10/07/15 0400  piperacillin-tazobactam (ZOSYN) IVPB 3.375 g     3.375 g 12.5 mL/hr over 240 Minutes Intravenous Every 8 hours 10/07/15 0119     10/06/15 2045  piperacillin-tazobactam (ZOSYN) IVPB 3.375 g     3.375 g 100 mL/hr over 30 Minutes Intravenous  Once 10/06/15 2039 10/07/15 0018   10/06/15 2045  vancomycin (VANCOCIN) IVPB 1000 mg/200 mL premix     1,000 mg 200 mL/hr over 60 Minutes Intravenous  Once 10/06/15 2039 10/07/15 0018        Subjective:   Elfredia Machen was seen and examined today. Dressing intact on the lower extremities. Pain controlled when laying however per son has unbearable pain in the left leg on movement.   Patient denies dizziness, chest pain, shortness of breath, abdominal pain, N/V/D/C, new weakness, numbess, tingling. No acute events overnight.    Objective:   Filed Vitals:   10/07/15 0500 10/07/15 1341 10/08/15 0605 10/08/15 0700  BP:  129/60 123/76   Pulse:  88 81   Temp:  98.1 F (36.7 C) 98 F (36.7 C)   TempSrc:      Resp:  18 18   Height:      Weight: 68.9 kg (151 lb 14.4 oz)   68.5 kg (151 lb 0.2 oz)  SpO2:  97% 97%     Intake/Output Summary (Last 24 hours) at 10/08/15 1105 Last data filed at 10/07/15 1343  Gross per 24 hour  Intake    120 ml  Output      0 ml  Net    120 ml     Wt Readings from Last 3 Encounters:  10/08/15 68.5 kg (151 lb 0.2 oz)  08/10/15 70.217 kg (154 lb 12.8 oz)  06/08/15 74.844 kg (165 lb)     Exam  General: Sleepy but easily arousable  HEENT:   Neck:   Cardiovascular: S1 S2 auscultated, no rubs, murmurs or gallops. Regular rate and rhythm.  Respiratory: Clear to auscultation bilaterally, no wheezing, rales or rhonchi  Gastrointestinal: Soft, nontender, nondistended, + bowel sounds  Ext: no cyanosis clubbing or edema  Neuro: did not follow commands for the neuro exam  Skin: cellulitis on the left lower extremity with small wound, dressing intact on the right lower extremity  Psych:  sleepy but arousable   Data Reviewed:  I have personally reviewed following labs and imaging  studies  Micro Results Recent Results (from the past 240 hour(s))  Blood Culture (routine x 2)     Status: None (Preliminary result)   Collection Time: 10/06/15  9:16 PM  Result Value Ref Range Status   Specimen Description BLOOD RIGHT ARM  Final   Special Requests BOTTLES DRAWN AEROBIC ONLY 10CC  Final   Culture NO GROWTH < 24 HOURS  Final   Report Status PENDING  Incomplete  Blood Culture (routine x 2)     Status: None (Preliminary result)   Collection Time: 10/06/15  9:38 PM  Result Value Ref Range Status   Specimen Description BLOOD LEFT ANTECUBITAL  Final   Special Requests BOTTLES DRAWN AEROBIC ONLY 5CC  Final   Culture  Setup Time   Final    GRAM POSITIVE COCCI IN CLUSTERS AEROBIC BOTTLE ONLY Organism ID to follow    Culture NO GROWTH < 24 HOURS  Final   Report Status PENDING  Incomplete  MRSA PCR Screening     Status: None   Collection Time: 10/07/15  1:08 AM  Result Value Ref Range Status   MRSA by PCR NEGATIVE NEGATIVE Final    Comment:        The GeneXpert MRSA Assay (FDA approved for NASAL specimens only), is one component of a comprehensive MRSA colonization surveillance program. It is not intended to diagnose MRSA infection nor to guide or monitor treatment for MRSA infections.     Radiology Reports Dg Tibia/fibula Left  10/06/2015  CLINICAL DATA:  Fall 10 days ago. Skin tear with possible infection. Initial encounter. EXAM: LEFT TIBIA AND FIBULA - 2 VIEW COMPARISON:  None. FINDINGS: Soft tissue swelling greatest about the lateral lower leg. There is no soft tissue gas or opaque foreign body. Negative for fracture or signs of osteomyelitis. Osteopenia and knee osteoarthritis. Arterial and soft tissue calcifications. IMPRESSION: Soft tissue swelling without opaque foreign body, soft tissue emphysema, or evidence of osteomyelitis. Electronically Signed   By: Monte Fantasia M.D.   On: 10/06/2015 22:00   Ct Lumbar Spine Wo Contrast  10/07/2015  CLINICAL DATA:  Chronic low back pain. History of fall 3 weeks ago. No known injury. Initial encounter. EXAM: CT LUMBAR SPINE WITHOUT CONTRAST TECHNIQUE: Multidetector CT imaging of the lumbar spine was performed without intravenous contrast administration. Multiplanar CT image reconstructions were also generated. COMPARISON:  CT chest, abdomen and pelvis 05/14/2011. FINDINGS: Vertebral body height and alignment are maintained. No lytic or sclerotic bony lesion is identified. No pars interarticularis defect is seen. Advanced degenerative change is present about the sacroiliac joints. A subtle cortical lucency in the anterior aspect of the left sacrum on images 117-121 of series 8 and 96-99 of series 5 is worrisome for insufficiency fracture. No other evidence fracture is identified. Imaged intra-abdominal contents demonstrate aortoiliac atherosclerosis. Bilateral renal cysts are partially visualized. The patient is status post cholecystectomy. T10-11: Small calcified disc bulge without central canal or foraminal stenosis. T11-12: Loss of disc space height and facet degenerative change. The central canal appears open. Bilateral foraminal narrowing is seen. T12-L1: Broad-based disc bulge with endplate spur. The central canal and foramina appear open. L1-2: Shallow broad-based central protrusion with calcification of the annulus. The central canal and foramina appear open. L2-3: Mild disc bulge and endplate spur without central canal or foraminal stenosis. L3-4: There is some ligamentum flavum thickening and a broad-based disc bulge with endplate spur. Mild central canal narrowing is seen. There is also some bilateral foraminal narrowing. L4-5: Disc  bulge, endplate spur and facet arthropathy are seen. There is mild to moderate central canal narrowing. Marked bilateral foraminal narrowing is also seen. L5-S1: There is facet degenerative  change and a shallow broad-based disc bulge. The central canal and right foramen are open. Mild to moderate left foraminal narrowing is identified. IMPRESSION: Findings highly suspicious for a nondisplaced left sacral fracture. Negative for lumbar spine fracture. Multilevel degenerative disc disease as detailed above. Electronically Signed   By: Inge Rise M.D.   On: 10/07/2015 16:24    Lab Data:  CBC:  Recent Labs Lab 10/06/15 2116 10/08/15 0512  WBC 10.5 11.0*  NEUTROABS 8.3*  --   HGB 11.3* 10.4*  HCT 35.5* 32.9*  MCV 110.6* 111.9*  PLT 190 123456*   Basic Metabolic Panel:  Recent Labs Lab 10/06/15 2116 10/07/15 1435 10/08/15 0512  NA 140 141 142  K 4.5 4.3 3.6  CL 102 105 105  CO2 29 28 30   GLUCOSE 85 150* 95  BUN 31* 28* 22*  CREATININE 1.50* 1.44* 1.30*  CALCIUM 9.0 8.7* 8.6*  MG  --  1.9  --    GFR: Estimated Creatinine Clearance: 26.8 mL/min (by C-G formula based on Cr of 1.3). Liver Function Tests:  Recent Labs Lab 10/06/15 2116 10/07/15 1435  AST 27 18  ALT 28 24  ALKPHOS 181* 169*  BILITOT 0.9 1.0  PROT 6.2* 5.6*  ALBUMIN 2.8* 2.5*   No results for input(s): LIPASE, AMYLASE in the last 168 hours. No results for input(s): AMMONIA in the last 168 hours. Coagulation Profile: No results for input(s): INR, PROTIME in the last 168 hours. Cardiac Enzymes: No results for input(s): CKTOTAL, CKMB, CKMBINDEX, TROPONINI in the last 168 hours. BNP (last 3 results)  Recent Labs  03/18/15 1643  PROBNP 288.0*   HbA1C: No results for input(s): HGBA1C in the last 72 hours. CBG: No results for input(s): GLUCAP in the last 168 hours. Lipid Profile: No results for input(s): CHOL, HDL, LDLCALC, TRIG, CHOLHDL, LDLDIRECT in the last 72 hours. Thyroid Function Tests: No results for input(s): TSH, T4TOTAL, FREET4, T3FREE, THYROIDAB in the last 72 hours. Anemia Panel: No results for input(s): VITAMINB12, FOLATE, FERRITIN, TIBC, IRON, RETICCTPCT in the last  72 hours. Urine analysis:    Component Value Date/Time   COLORURINE YELLOW 03/18/2015 1643   APPEARANCEUR CLEAR 03/18/2015 1643   LABSPEC 1.010 03/18/2015 1643   PHURINE 6.0 03/18/2015 1643   GLUCOSEU NEGATIVE 03/18/2015 1643   GLUCOSEU NEGATIVE 01/23/2015 1606   HGBUR NEGATIVE 03/18/2015 1643   HGBUR large 01/26/2010 1104   BILIRUBINUR NEGATIVE 03/18/2015 [place] 03/18/2015 1643   PROTEINUR NEGATIVE 01/23/2015 1606   UROBILINOGEN 0.2 03/18/2015 1643   NITRITE NEGATIVE 03/18/2015 [place] 03/18/2015 [place] M.D. [facility] 10/08/2015, 11:05 AM  Pager: [phone] Between 7am to 7pm - call Pager - [phone]  After 7pm go to [email or web address] - password [email or web address]  Call night coverage person covering after 7pm

## 2015-10-09 ENCOUNTER — Inpatient Hospital Stay (HOSPITAL_COMMUNITY): Payer: Medicare Other

## 2015-10-09 ENCOUNTER — Ambulatory Visit: Payer: Medicare Other | Admitting: Internal Medicine

## 2015-10-09 DIAGNOSIS — I82409 Acute embolism and thrombosis of unspecified deep veins of unspecified lower extremity: Secondary | ICD-10-CM

## 2015-10-09 LAB — BLOOD CULTURE ID PANEL (REFLEXED)
ACINETOBACTER BAUMANNII: NOT DETECTED
CARBAPENEM RESISTANCE: NOT DETECTED
Candida albicans: NOT DETECTED
Candida glabrata: NOT DETECTED
Candida krusei: NOT DETECTED
Candida parapsilosis: NOT DETECTED
Candida tropicalis: NOT DETECTED
ENTEROBACTERIACEAE SPECIES: NOT DETECTED
ENTEROCOCCUS SPECIES: NOT DETECTED
Enterobacter cloacae complex: NOT DETECTED
Escherichia coli: NOT DETECTED
HAEMOPHILUS INFLUENZAE: NOT DETECTED
Klebsiella oxytoca: NOT DETECTED
Klebsiella pneumoniae: NOT DETECTED
LISTERIA MONOCYTOGENES: NOT DETECTED
Methicillin resistance: NOT DETECTED
NEISSERIA MENINGITIDIS: NOT DETECTED
PSEUDOMONAS AERUGINOSA: NOT DETECTED
Proteus species: NOT DETECTED
SERRATIA MARCESCENS: NOT DETECTED
STAPHYLOCOCCUS AUREUS BCID: NOT DETECTED
STAPHYLOCOCCUS SPECIES: DETECTED — AB
STREPTOCOCCUS AGALACTIAE: NOT DETECTED
STREPTOCOCCUS PYOGENES: NOT DETECTED
STREPTOCOCCUS SPECIES: NOT DETECTED
Streptococcus pneumoniae: NOT DETECTED
VANCOMYCIN RESISTANCE: NOT DETECTED

## 2015-10-09 LAB — BASIC METABOLIC PANEL
Anion gap: 10 (ref 5–15)
BUN: 21 mg/dL — AB (ref 6–20)
CHLORIDE: 105 mmol/L (ref 101–111)
CO2: 27 mmol/L (ref 22–32)
Calcium: 8.4 mg/dL — ABNORMAL LOW (ref 8.9–10.3)
Creatinine, Ser: 1.36 mg/dL — ABNORMAL HIGH (ref 0.44–1.00)
GFR calc Af Amer: 38 mL/min — ABNORMAL LOW (ref >60)
GFR calc non Af Amer: 33 mL/min — ABNORMAL LOW (ref >60)
GLUCOSE: 88 mg/dL (ref 65–99)
POTASSIUM: 3.3 mmol/L — AB (ref 3.5–5.1)
Sodium: 142 mmol/L (ref 135–145)

## 2015-10-09 LAB — CBC
HCT: 30.5 % — ABNORMAL LOW (ref 36.0–46.0)
HEMOGLOBIN: 9.7 g/dL — AB (ref 12.0–15.0)
MCH: 35.8 pg — AB (ref 26.0–34.0)
MCHC: 31.8 g/dL (ref 30.0–36.0)
MCV: 112.5 fL — AB (ref 78.0–100.0)
Platelets: 148 10*3/uL — ABNORMAL LOW (ref 150–400)
RBC: 2.71 MIL/uL — AB (ref 3.87–5.11)
RDW: 16 % — ABNORMAL HIGH (ref 11.5–15.5)
WBC: 17.2 10*3/uL — ABNORMAL HIGH (ref 4.0–10.5)

## 2015-10-09 LAB — C DIFFICILE QUICK SCREEN W PCR REFLEX
C DIFFICILE (CDIFF) INTERP: NEGATIVE
C DIFFICLE (CDIFF) ANTIGEN: NEGATIVE
C Diff toxin: NEGATIVE

## 2015-10-09 MED ORDER — LOPERAMIDE HCL 2 MG PO CAPS
2.0000 mg | ORAL_CAPSULE | Freq: Four times a day (QID) | ORAL | Status: DC | PRN
  Administered 2015-10-09: 2 mg via ORAL
  Filled 2015-10-09: qty 1

## 2015-10-09 MED ORDER — FOLIC ACID 1 MG PO TABS
1.0000 mg | ORAL_TABLET | Freq: Every morning | ORAL | Status: DC
  Administered 2015-10-09 – 2015-10-15 (×7): 1 mg via ORAL
  Filled 2015-10-09 (×7): qty 1

## 2015-10-09 MED ORDER — HEPARIN (PORCINE) IN NACL 100-0.45 UNIT/ML-% IJ SOLN
650.0000 [IU]/h | INTRAMUSCULAR | Status: DC
  Administered 2015-10-09: 800 [IU]/h via INTRAVENOUS
  Filled 2015-10-09: qty 250

## 2015-10-09 MED ORDER — ONDANSETRON HCL 4 MG/2ML IJ SOLN
4.0000 mg | Freq: Four times a day (QID) | INTRAMUSCULAR | Status: DC | PRN
  Administered 2015-10-09: 4 mg via INTRAVENOUS
  Filled 2015-10-09 (×2): qty 2

## 2015-10-09 NOTE — Progress Notes (Signed)
[facility]                                                                              Patient Demographics  Nicole Roberts, is a 82 y.o. female, DOB - [birth year], [id number]  Admit date - 10/06/2015   Admitting Physician Phillips Grout, MD  Outpatient Primary MD for the patient is [name], MD  Outpatient specialists:   LOS - 3  days    Chief Complaint  Patient presents with  . Leg Pain  . Cellulitis       Brief summary   Patient is a 82 year old from [facility] with history of atrial fibrillation, hypertension and hyperlipidemia, GERD, hypothyroidism, anemia, TIA presented to ED with cellulitis, leg pain. Patient apparently fell and hit her leg over 3 weeks ago and developed a skin tear. About a week prior to admission he started to get more red and erythematous. She was started on Keflex 2 days prior to admission. The redness worsened despite antibiotics over to the anterior leg up to the knee. No fevers. Patient was admitted for cellulitis.   Assessment & Plan    Principal Problem:   Cellulitis- - Outpatient failure to Keflex - Patient was placed on IV vancomycin and Zosyn - Wound care consulted  Active Problems: Acute on chronic back pain, recent fall, radiating to legs Likely due to sacral fracture - CT of the lumbar spine showed left sacral fracture - IR consulted for kyphoplasty, tentatively planning on Monday   Right leg pain   - Doppler ultrasound of LE positive for DVT in the right posterior tibial and peroneal vein.  - Patient was on eliquis with renal dosing prior to admission, will need to be either on Coumadin or xarelto, considering CKD. For now, eliquis on hold and placed on heparin drip per pharmacy. Will hold heparin drip prior to the procedure    Essential hypertension - Currently controlled - Continue furosemide, Imdur, metoprolol    Rheumatoid arthritis (HCC) - On chronic steroids    Atrial  fibrillation (HCC) - Currently rate controlled - eliquis on hold, place on heparin drip per pharmacy    Mild acute on chronic Stage III chronic kidney disease - Baseline creatinine 1.3-1.4, currently at baseline    Chronic combined systolic and diastolic CHF (congestive heart failure) ([place]) -Currently compensated    Code Status: dnr  DVT Prophylaxis: apixaban on hold, placing on heparin drip   Family Communication: Discussed in detail with the patient, all imaging results, lab results explained to the patient and son in detail on phone   Disposition Plan:   Time Spent in minutes   25 minutes  Procedures:  CT lumbar spine  Consultants:   Interventional radiology  Antimicrobials:   IV vancomycin 6/6  IV Zosyn 6/7    Medications  Scheduled Meds: . calcium-vitamin D  1 tablet Oral BID  . cholecalciferol  1,000 Units Oral Daily  . citalopram  20 mg Oral Daily  . conjugated estrogens  1 Applicatorful Vaginal Once per day on Mon Wed Fri  . cyanocobalamin  500 mcg Oral Daily  . fentaNYL  75 mcg Transdermal Q72H  . folic acid  1 mg Oral q morning - 10a  . furosemide  40 mg Oral Daily  . gabapentin  800 mg Oral QHS  . isosorbide mononitrate  15 mg Oral Daily  . magnesium oxide  400 mg Oral Daily  . metoprolol succinate  50 mg Oral BID  . multivitamin  1 tablet Oral Daily  . nystatin  5 mL Oral QID  . pantoprazole  40 mg Oral BID  . piperacillin-tazobactam (ZOSYN)  IV  3.375 g Intravenous Q8H  . potassium chloride SA  20 mEq Oral Daily  . predniSONE  5 mg Oral Q breakfast  . sodium chloride flush  3 mL Intravenous Q12H  . sucralfate  1 g Oral BID  . vancomycin  500 mg Intravenous Q24H   Continuous Infusions:  PRN Meds:.sodium chloride, HYDROcodone-acetaminophen, nitroGLYCERIN, ondansetron (ZOFRAN) IV, sodium chloride flush   Antibiotics   Anti-infectives    Start     Dose/Rate Route Frequency Ordered Stop   10/07/15 2000  vancomycin (VANCOCIN) 500 mg in  sodium chloride 0.9 % 100 mL IVPB     500 mg 100 mL/hr over 60 Minutes Intravenous Every 24 hours 10/07/15 0119     10/07/15 0400  piperacillin-tazobactam (ZOSYN) IVPB 3.375 g     3.375 g 12.5 mL/hr over 240 Minutes Intravenous Every 8 hours 10/07/15 0119     10/06/15 2045  piperacillin-tazobactam (ZOSYN) IVPB 3.375 g     3.375 g 100 mL/hr over 30 Minutes Intravenous  Once 10/06/15 2039 10/07/15 0018   10/06/15 2045  vancomycin (VANCOCIN) IVPB 1000 mg/200 mL premix     1,000 mg 200 mL/hr over 60 Minutes Intravenous  Once 10/06/15 2039 10/07/15 0018        Subjective:   Nicole Roberts was seen and examined today. Continues to complain of left leg pain, back pain and right leg pain. Nausea today, diarrhea +.    Patient denies dizziness, chest pain, shortness of breath. No acute events overnight.    Objective:   Filed Vitals:   10/08/15 0700 10/08/15 2205 10/09/15 0510 10/09/15 0654  BP:  130/66 104/54   Pulse:  79 90   Temp:  98.2 F (36.8 C) 98.1 F (36.7 C)   TempSrc:  Oral Oral   Resp:  20 20   Height:      Weight: 68.5 kg (151 lb 0.2 oz)   68.9 kg (151 lb 14.4 oz)  SpO2:  97% 98%     Intake/Output Summary (Last 24 hours) at 10/09/15 1216 Last data filed at 10/09/15 0515  Gross per 24 hour  Intake    500 ml  Output      0 ml  Net    500 ml     Wt Readings from Last 3 Encounters:  10/09/15 68.9 kg (151 lb 14.4 oz)  08/10/15 70.217 kg (154 lb 12.8 oz)  06/08/15 74.844 kg (165 lb)     Exam  General: Alert and oriented, NAD  HEENT:   Neck:   Cardiovascular: S1 S2 auscultated, no rubs, murmurs or gallops. Regular rate and rhythm.  Respiratory: Clear to auscultation bilaterally, no wheezing, rales or rhonchi  Gastrointestinal: Soft, nontender, nondistended, + bowel sounds  Ext: no cyanosis clubbing or edema  Neuro: no new deficits  Skin: Dressing intact on the lower extremities  Psych: alert and oriented   Data Reviewed:  I have personally  reviewed following labs and imaging studies  Micro Results Recent  Results (from the past 240 hour(s))  Blood Culture (routine x 2)     Status: None (Preliminary result)   Collection Time: 10/06/15  9:16 PM  Result Value Ref Range Status   Specimen Description BLOOD RIGHT ARM  Final   Special Requests BOTTLES DRAWN AEROBIC ONLY 10CC  Final   Culture NO GROWTH 2 DAYS  Final   Report Status PENDING  Incomplete  Blood Culture (routine x 2)     Status: Abnormal (Preliminary result)   Collection Time: 10/06/15  9:38 PM  Result Value Ref Range Status   Specimen Description BLOOD LEFT ANTECUBITAL  Final   Special Requests BOTTLES DRAWN AEROBIC ONLY 5CC  Final   Culture  Setup Time   Final    GRAM POSITIVE COCCI IN CLUSTERS AEROBIC BOTTLE ONLY Organism ID to follow CRITICAL RESULT CALLED TO, READ BACK BY AND VERIFIED WITH: C STEWART [facility] D [id number] M WILSON    Culture (A)  Final    STAPHYLOCOCCUS SPECIES (COAGULASE NEGATIVE) THE SIGNIFICANCE OF ISOLATING THIS ORGANISM FROM A SINGLE SET OF BLOOD CULTURES WHEN MULTIPLE SETS ARE DRAWN IS UNCERTAIN. PLEASE NOTIFY THE MICROBIOLOGY DEPARTMENT WITHIN ONE WEEK IF SPECIATION AND SENSITIVITIES ARE REQUIRED.    Report Status PENDING  Incomplete  Blood Culture ID Panel (Reflexed)     Status: Abnormal   Collection Time: 10/06/15  9:38 PM  Result Value Ref Range Status   Enterococcus species NOT DETECTED NOT DETECTED Final   Vancomycin resistance NOT DETECTED NOT DETECTED Final   Listeria monocytogenes NOT DETECTED NOT DETECTED Final   Staphylococcus species DETECTED (A) NOT DETECTED Corrected    Comment: CRITICAL RESULT CALLED TO, READ BACK BY AND VERIFIED WITH: CLamont Snowball. D. 11:35 10/08/15 (wilsonm) CORRECTED ON 06/09 AT 1034: PREVIOUSLY REPORTED AS DETECTED C. Lamont Snowball. D. 11:35 10/08/15 ([email or web address])    Staphylococcus aureus NOT DETECTED NOT DETECTED Final   Methicillin resistance NOT DETECTED NOT DETECTED Final   Streptococcus species  NOT DETECTED NOT DETECTED Final   Streptococcus agalactiae NOT DETECTED NOT DETECTED Final   Streptococcus pneumoniae NOT DETECTED NOT DETECTED Final   Streptococcus pyogenes NOT DETECTED NOT DETECTED Final   Acinetobacter baumannii NOT DETECTED NOT DETECTED Final   Enterobacteriaceae species NOT DETECTED NOT DETECTED Final   Enterobacter cloacae complex NOT DETECTED NOT DETECTED Final   Escherichia coli NOT DETECTED NOT DETECTED Final   Klebsiella oxytoca NOT DETECTED NOT DETECTED Final   Klebsiella pneumoniae NOT DETECTED NOT DETECTED Final   Proteus species NOT DETECTED NOT DETECTED Final   Serratia marcescens NOT DETECTED NOT DETECTED Final   Carbapenem resistance NOT DETECTED NOT DETECTED Final   Haemophilus influenzae NOT DETECTED NOT DETECTED Final   Neisseria meningitidis NOT DETECTED NOT DETECTED Final   Pseudomonas aeruginosa NOT DETECTED NOT DETECTED Final   Candida albicans NOT DETECTED NOT DETECTED Final   Candida glabrata NOT DETECTED NOT DETECTED Final   Candida krusei NOT DETECTED NOT DETECTED Final   Candida parapsilosis NOT DETECTED NOT DETECTED Final   Candida tropicalis NOT DETECTED NOT DETECTED Final  MRSA PCR Screening     Status: None   Collection Time: 10/07/15  1:08 AM  Result Value Ref Range Status   MRSA by PCR NEGATIVE NEGATIVE Final    Comment:        The GeneXpert MRSA Assay (FDA approved for NASAL specimens only), is one component of a comprehensive MRSA colonization surveillance program. It is not intended to diagnose MRSA infection nor to guide or  monitor treatment for MRSA infections.     Radiology Reports Dg Tibia/fibula Left  10/06/2015  CLINICAL DATA:  Fall 10 days ago. Skin tear with possible infection. Initial encounter. EXAM: LEFT TIBIA AND FIBULA - 2 VIEW COMPARISON:  None. FINDINGS: Soft tissue swelling greatest about the lateral lower leg. There is no soft tissue gas or opaque foreign body. Negative for fracture or signs of  osteomyelitis. Osteopenia and knee osteoarthritis. Arterial and soft tissue calcifications. IMPRESSION: Soft tissue swelling without opaque foreign body, soft tissue emphysema, or evidence of osteomyelitis. Electronically Signed   By: Monte Fantasia M.D.   On: 10/06/2015 22:00   Ct Lumbar Spine Wo Contrast  10/07/2015  CLINICAL DATA:  Chronic low back pain. History of fall 3 weeks ago. No known injury. Initial encounter. EXAM: CT LUMBAR SPINE WITHOUT CONTRAST TECHNIQUE: Multidetector CT imaging of the lumbar spine was performed without intravenous contrast administration. Multiplanar CT image reconstructions were also generated. COMPARISON:  CT chest, abdomen and pelvis 05/14/2011. FINDINGS: Vertebral body height and alignment are maintained. No lytic or sclerotic bony lesion is identified. No pars interarticularis defect is seen. Advanced degenerative change is present about the sacroiliac joints. A subtle cortical lucency in the anterior aspect of the left sacrum on images 117-121 of series 8 and 96-99 of series 5 is worrisome for insufficiency fracture. No other evidence fracture is identified. Imaged intra-abdominal contents demonstrate aortoiliac atherosclerosis. Bilateral renal cysts are partially visualized. The patient is status post cholecystectomy. T10-11: Small calcified disc bulge without central canal or foraminal stenosis. T11-12: Loss of disc space height and facet degenerative change. The central canal appears open. Bilateral foraminal narrowing is seen. T12-L1: Broad-based disc bulge with endplate spur. The central canal and foramina appear open. L1-2: Shallow broad-based central protrusion with calcification of the annulus. The central canal and foramina appear open. L2-3: Mild disc bulge and endplate spur without central canal or foraminal stenosis. L3-4: There is some ligamentum flavum thickening and a broad-based disc bulge with endplate spur. Mild central canal narrowing is seen. There is  also some bilateral foraminal narrowing. L4-5: Disc bulge, endplate spur and facet arthropathy are seen. There is mild to moderate central canal narrowing. Marked bilateral foraminal narrowing is also seen. L5-S1: There is facet degenerative change and a shallow broad-based disc bulge. The central canal and right foramen are open. Mild to moderate left foraminal narrowing is identified. IMPRESSION: Findings highly suspicious for a nondisplaced left sacral fracture. Negative for lumbar spine fracture. Multilevel degenerative disc disease as detailed above. Electronically Signed   By: Inge Rise M.D.   On: 10/07/2015 16:24    Lab Data:  CBC:  Recent Labs Lab 10/06/15 2116 10/08/15 0512 10/09/15 0546  WBC 10.5 11.0* 17.2*  NEUTROABS 8.3*  --   --   HGB 11.3* 10.4* 9.7*  HCT 35.5* 32.9* 30.5*  MCV 110.6* 111.9* 112.5*  PLT 190 146* 123456*   Basic Metabolic Panel:  Recent Labs Lab 10/06/15 2116 10/07/15 1435 10/08/15 0512 10/09/15 0546  NA 140 141 142 142  K 4.5 4.3 3.6 3.3*  CL 102 105 105 105  CO2 29 28 30 27   GLUCOSE 85 150* 95 88  BUN 31* 28* 22* 21*  CREATININE 1.50* 1.44* 1.30* 1.36*  CALCIUM 9.0 8.7* 8.6* 8.4*  MG  --  1.9  --   --    GFR: Estimated Creatinine Clearance: 25.7 mL/min (by C-G formula based on Cr of 1.36). Liver Function Tests:  Recent Labs Lab 10/06/15 2116  10/07/15 1435  AST 27 18  ALT 28 24  ALKPHOS 181* 169*  BILITOT 0.9 1.0  PROT 6.2* 5.6*  ALBUMIN 2.8* 2.5*   No results for input(s): LIPASE, AMYLASE in the last 168 hours. No results for input(s): AMMONIA in the last 168 hours. Coagulation Profile:  Recent Labs Lab 10/08/15 1154  INR 1.27   Cardiac Enzymes: No results for input(s): CKTOTAL, CKMB, CKMBINDEX, TROPONINI in the last 168 hours. BNP (last 3 results)  Recent Labs  03/18/15 1643  PROBNP 288.0*   HbA1C: No results for input(s): HGBA1C in the last 72 hours. CBG: No results for input(s): GLUCAP in the last 168  hours. Lipid Profile: No results for input(s): CHOL, HDL, LDLCALC, TRIG, CHOLHDL, LDLDIRECT in the last 72 hours. Thyroid Function Tests: No results for input(s): TSH, T4TOTAL, FREET4, T3FREE, THYROIDAB in the last 72 hours. Anemia Panel: No results for input(s): VITAMINB12, FOLATE, FERRITIN, TIBC, IRON, RETICCTPCT in the last 72 hours. Urine analysis:    Component Value Date/Time   COLORURINE YELLOW 03/18/2015 1643   APPEARANCEUR CLEAR 03/18/2015 1643   LABSPEC 1.010 03/18/2015 1643   PHURINE 6.0 03/18/2015 1643   GLUCOSEU NEGATIVE 03/18/2015 1643   GLUCOSEU NEGATIVE 01/23/2015 1606   HGBUR NEGATIVE 03/18/2015 1643   HGBUR large 01/26/2010 1104   BILIRUBINUR NEGATIVE 03/18/2015 [place] 03/18/2015 1643   PROTEINUR NEGATIVE 01/23/2015 1606   UROBILINOGEN 0.2 03/18/2015 1643   NITRITE NEGATIVE 03/18/2015 [place] 03/18/2015 [place] M.D. [facility] 10/09/2015, 12:16 PM  Pager: [phone] Between 7am to 7pm - call Pager - [phone]  After 7pm go to [email or web address] - password [email or web address]  Call night coverage person covering after 7pm

## 2015-10-09 NOTE — Progress Notes (Signed)
Dr. Tana Coast notified of Vascular Results.

## 2015-10-09 NOTE — Consult Note (Signed)
Chief Complaint: Patient was seen in consultation today for sacroplasty Chief Complaint  Patient presents with  . Leg Pain  . Cellulitis   at the request of Dr Estill Cotta  Referring Physician(s): Dr Estill Cotta  Supervising Physician: Luanne Bras  Patient Status: In-pt   History of Present Illness: Nicole Roberts is a 82 y.o. female   Lives at SNF and fell 3 weeks ago Presented with cellulitis of leg Work up + DVT Rt low extremity Last dose Eliquis 6/9 930 am Now on Heparin for DVT  CT Lumbar: IMPRESSION: Findings highly suspicious for a nondisplaced left sacral fracture. Negative for lumbar spine fracture.  Pt suffering severe back pain Almost any  Movement is painful Lying in bed still---- even painful now Request for Sacroplasty per [facility] Dr Estanislado Pandy has reviewed imaging and approves procedure Insurance approval ongoing  Hx Afib; HTN; HLD   Past Medical History  Diagnosis Date  . HEARING LOSS   . MITRAL VALVE INSUFF&AORTIC VALVE INSUFF     moderate MR  . PULMONARY HYPERTENSION   . Atrial fibrillation ([place])     permanent  . SICK SINUS SYNDROME     a. Tachybrady syndrome - Guidant PPM 10/2005.  . Edema 03/19/2009    due to venous insufficiency, chronic lymphedema  . URINARY INCONTINENCE   . Rheumatoid arthritis(714.0) dx clarified 2011    On prednisone  . BREAST CANCER 12/2006    ductal ca s/p L lumpectomy  . Diastolic dysfunction   . HYPERTENSION   . HYPERLIPIDEMIA   . GERD   . HYPOTHYROIDISM   . Hyperparathyroidism     2 lobes removed  . Venous insufficiency     chronic BLE edema  . Spinal stenosis   . Anemia     a. Macrocytic - normal B12, folate 08/2011. b. 3/6 heme positive stools 10/2011 - per PCP note, elected against colonscopy.  [name] TIA (transient ischemic attack) 05/2011  . Anxiety     Past Surgical History  Procedure Laterality Date  . Pacemaker placement  2005    SSS and syncope  . Cholecystectomy  04/06/99  .  Abdominal hysterectomy  1970    Partial  . Left hip arthroscopy  1995  . Right hip arthroscopy  01/2001  . Carpal tunnel release  1998    bilateral  . Breast surgery  12/2006    Left breast lupectomy- Ductal CA  . Left parathyroidectomy  07/2008  . Esophagogastroduodenoscopy N/A 10/18/2012    Procedure: ESOPHAGOGASTRODUODENOSCOPY (EGD);  Surgeon: Ladene Artist, MD;  Location: [facility];  Service: Endoscopy;  Laterality: N/A;  . Esophagogastroduodenoscopy (egd) with propofol N/A 01/29/2015    Procedure: ESOPHAGOGASTRODUODENOSCOPY (EGD) WITH PROPOFOL;  Surgeon: Inda Castle, MD;  Location: [facility];  Service: Endoscopy;  Laterality: N/A;  . Ercp N/A 01/29/2015    Procedure: ENDOSCOPIC RETROGRADE CHOLANGIOPANCREATOGRAPHY (ERCP);  Surgeon: Inda Castle, MD;  Location: [facility];  Service: Endoscopy;  Laterality: N/A;    Allergies: Sulfa antibiotics and Tramadol hcl  Medications: Prior to Admission medications   Medication Sig Start Date End Date Taking? Authorizing Provider  apixaban (ELIQUIS) 2.5 MG TABS tablet Take 1 tablet (2.5 mg total) by mouth 2 (two) times daily. 02/04/15  Yes Charlynne Cousins, MD  calcium citrate-vitamin D (CITRACAL+D) 315-200 MG-UNIT tablet Take 1 tablet by mouth 2 (two) times daily. Patient taking differently: Take 1 tablet by mouth 2 (two) times daily. Gummies 08/24/15  Yes [name], MD  cephALEXin (  KEFLEX) 500 MG capsule Take 1 capsule (500 mg total) by mouth 2 (two) times daily. 10/05/15  Yes Lucretia Kern, DO  cholecalciferol (VITAMIN D) 1000 UNITS tablet Take 1,000 Units by mouth daily.   Yes Historical Provider, MD  citalopram (CELEXA) 20 MG tablet Take 20 mg by mouth daily.   Yes Historical Provider, MD  conjugated estrogens (PREMARIN) vaginal cream Place 1 Applicatorful vaginally 3 (three) times a week.   Yes Historical Provider, MD  fentaNYL (DURAGESIC - DOSED MCG/HR) 75 MCG/HR Place 1 patch (75 mcg total) onto the skin every 3 (three)  days. 09/14/15  Yes [name], MD  folic acid (FOLVITE) A999333 MCG tablet Take 400 mcg by mouth every morning.   Yes Historical Provider, MD  furosemide (LASIX) 20 MG tablet Take 2 tablets (40 mg total) by mouth daily. Until directed to decrease back to 20 mg daily 03/18/15  Yes [name], MD  gabapentin (NEURONTIN) 400 MG capsule Take 2 capsules (800 mg total) by mouth at bedtime. 12/02/14  Yes Rowe Clack, MD  HYDROcodone-acetaminophen (NORCO/VICODIN) 5-325 MG tablet Take 1 tablet by mouth every 6 (six) hours as needed for moderate pain.   Yes Historical Provider, MD  isosorbide mononitrate (IMDUR) 30 MG 24 hr tablet Take 15 mg by mouth daily.   Yes Historical Provider, MD  levothyroxine (SYNTHROID, LEVOTHROID) 50 MCG tablet Take 1 tablet (50 mcg total) by mouth daily. Patient taking differently: Take 50 mcg by mouth daily before breakfast.  06/09/14  Yes Rowe Clack, MD  magnesium oxide (MAG-OX) 400 MG tablet Take 400 mg by mouth daily.   Yes Historical Provider, MD  metoprolol succinate (TOPROL-XL) 50 MG 24 hr tablet Take 1 tablet (50 mg total) by mouth 2 (two) times daily. Take with or immediately following a meal. 06/02/14  Yes Rowe Clack, MD  Multiple Vitamin (DAILY-VITE) TABS TAKE ONE TABLET BY MOUTH ONCE DAILY. 07/17/15  Yes [name], MD  nitroGLYCERIN (NITROSTAT) 0.4 MG SL tablet Place 0.4 mg under the tongue every 5 (five) minutes x 3 doses as needed for chest pain. 03/20/12  Yes Roger A Arguello, PA-C  nystatin (MYCOSTATIN) 100000 UNIT/ML suspension Take 5 mLs (500,000 Units total) by mouth 4 (four) times daily. Use for two days after symptoms have resolved and then stop 09/21/15  Yes [name], MD  pantoprazole (PROTONIX) 40 MG tablet Take 1 tablet (40 mg total) by mouth 2 (two) times daily. 08/24/15  Yes [name], MD  potassium chloride SA (K-DUR,KLOR-CON) 20 MEQ tablet TAKE ONE TABLET BY MOUTH ONCE DAILY. 06/26/15  Yes [name], MD  predniSONE  (DELTASONE) 5 MG tablet Take 5 mg by mouth daily with breakfast.   Yes Historical Provider, MD  sucralfate (CARAFATE) 1 GM/10ML suspension Take 1 g by mouth 2 (two) times daily.   Yes Historical Provider, MD  vitamin B-12 (CYANOCOBALAMIN) 500 MCG tablet TAKE ONE TABLET BY MOUTH ONCE DAILY. 11/20/13  Yes Rowe Clack, MD     Family History  Problem Relation Age of Onset  . Ovarian cancer Mother   . Coronary artery disease Sister   . Coronary artery disease Brother   . Hypertension Mother     grandparent  . Lung cancer Father     Social History   Social History  . Marital Status: Married    Spouse Name: N/A  . Number of Children: 6  . Years of Education: N/A   Occupational History  .  Retired Agricultural engineer    Social History Main Topics  . Smoking status: Never Smoker   . Smokeless tobacco: Never Used  . Alcohol Use: No  . Drug Use: No  . Sexual Activity: Not Currently   Other Topics Concern  . None   Social History Narrative   Lives at [facility] since 03/2009- married, lives with spouse. Moved here from [place] to be near son    Review of Systems: A 12 point ROS discussed and pertinent positives are indicated in the HPI above.  All other systems are negative.  Review of Systems  Constitutional: Positive for activity change and appetite change. Negative for fever.  Respiratory: Negative for shortness of breath.   Musculoskeletal: Positive for back pain, joint swelling and gait problem.  Neurological: Positive for weakness.  Psychiatric/Behavioral: Positive for confusion.    Vital Signs: BP 101/61 mmHg  Pulse 110  Temp(Src) 98.3 F (36.8 C) (Oral)  Resp 18  Ht 5\' 4"  (1.626 m)  Wt 151 lb 14.4 oz (68.9 kg)  BMI 26.06 kg/m2  SpO2 98%  Physical Exam  Cardiovascular: Normal rate and regular rhythm.   Pulmonary/Chest: Effort normal. She has wheezes.  Abdominal: Soft.  Musculoskeletal: Normal range of motion. She exhibits edema and tenderness.    Severe pain to touch pts back----low back pain especially  Lower extremities are edematous  Skin: Skin is warm.  Psychiatric:  Consented son at bedside  Nursing note and vitals reviewed.   Mallampati Score:  MD Evaluation Airway: WNL Heart: WNL Abdomen: WNL Chest/ Lungs: WNL ASA  Classification: 3 Mallampati/Airway Score: Two  Imaging: Dg Tibia/fibula Left  10/06/2015  CLINICAL DATA:  Fall 10 days ago. Skin tear with possible infection. Initial encounter. EXAM: LEFT TIBIA AND FIBULA - 2 VIEW COMPARISON:  None. FINDINGS: Soft tissue swelling greatest about the lateral lower leg. There is no soft tissue gas or opaque foreign body. Negative for fracture or signs of osteomyelitis. Osteopenia and knee osteoarthritis. Arterial and soft tissue calcifications. IMPRESSION: Soft tissue swelling without opaque foreign body, soft tissue emphysema, or evidence of osteomyelitis. Electronically Signed   By: Monte Fantasia M.D.   On: 10/06/2015 22:00   Ct Lumbar Spine Wo Contrast  10/07/2015  CLINICAL DATA:  Chronic low back pain. History of fall 3 weeks ago. No known injury. Initial encounter. EXAM: CT LUMBAR SPINE WITHOUT CONTRAST TECHNIQUE: Multidetector CT imaging of the lumbar spine was performed without intravenous contrast administration. Multiplanar CT image reconstructions were also generated. COMPARISON:  CT chest, abdomen and pelvis 05/14/2011. FINDINGS: Vertebral body height and alignment are maintained. No lytic or sclerotic bony lesion is identified. No pars interarticularis defect is seen. Advanced degenerative change is present about the sacroiliac joints. A subtle cortical lucency in the anterior aspect of the left sacrum on images 117-121 of series 8 and 96-99 of series 5 is worrisome for insufficiency fracture. No other evidence fracture is identified. Imaged intra-abdominal contents demonstrate aortoiliac atherosclerosis. Bilateral renal cysts are partially visualized. The patient is  status post cholecystectomy. T10-11: Small calcified disc bulge without central canal or foraminal stenosis. T11-12: Loss of disc space height and facet degenerative change. The central canal appears open. Bilateral foraminal narrowing is seen. T12-L1: Broad-based disc bulge with endplate spur. The central canal and foramina appear open. L1-2: Shallow broad-based central protrusion with calcification of the annulus. The central canal and foramina appear open. L2-3: Mild disc bulge and endplate spur without central canal or foraminal stenosis. L3-4: There is some ligamentum  flavum thickening and a broad-based disc bulge with endplate spur. Mild central canal narrowing is seen. There is also some bilateral foraminal narrowing. L4-5: Disc bulge, endplate spur and facet arthropathy are seen. There is mild to moderate central canal narrowing. Marked bilateral foraminal narrowing is also seen. L5-S1: There is facet degenerative change and a shallow broad-based disc bulge. The central canal and right foramen are open. Mild to moderate left foraminal narrowing is identified. IMPRESSION: Findings highly suspicious for a nondisplaced left sacral fracture. Negative for lumbar spine fracture. Multilevel degenerative disc disease as detailed above. Electronically Signed   By: Inge Rise M.D.   On: 10/07/2015 16:24    Labs:  CBC:  Recent Labs  03/18/15 1643 10/06/15 2116 10/08/15 0512 10/09/15 0546  WBC 10.1 10.5 11.0* 17.2*  HGB 11.9* 11.3* 10.4* 9.7*  HCT 37.5 35.5* 32.9* 30.5*  PLT 145.0* 190 146* 148*    COAGS:  Recent Labs  10/08/15 1154  INR 1.27  APTT 29    BMP:  Recent Labs  10/06/15 2116 10/07/15 1435 10/08/15 0512 10/09/15 0546  NA 140 141 142 142  K 4.5 4.3 3.6 3.3*  CL 102 105 105 105  CO2 29 28 30 27   GLUCOSE 85 150* 95 88  BUN 31* 28* 22* 21*  CALCIUM 9.0 8.7* 8.6* 8.4*  CREATININE 1.50* 1.44* 1.30* 1.36*  GFRNONAA 29* 31* 35* 33*  GFRAA 34* 36* 40* 38*    LIVER  FUNCTION TESTS:  Recent Labs  03/18/15 1643 06/08/15 1418 10/06/15 2116 10/07/15 1435  BILITOT 0.5 0.9 0.9 1.0  AST 21 18 27 18   ALT 19 24 28 24   ALKPHOS 76 54 181* 169*  PROT 6.6 6.2 6.2* 5.6*  ALBUMIN 3.8 3.6 2.8* 2.5*    TUMOR MARKERS: No results for input(s): AFPTM, CEA, CA199, CHROMGRNA in the last 8760 hours.  Assessment and Plan:  Severe back pain after fall 3 weeks ago Worsening + Sacral fx on CT Scheduled for Sacroplasty in IR -- possibly Mon 6/12 Insurance approval pending LD Eliquis 6/9 930 am On Deltasone (wbc high) afeb Risks and Benefits discussed with the patient's son including, but not limited to education regarding the natural healing process of compression fractures without intervention, bleeding, infection, cement migration which may cause spinal cord damage, paralysis, pulmonary embolism or even death. All of the patient's sons questions were answered, he is agreeable to proceed. Consent signed and in chart.    Thank you for this interesting consult.  I greatly enjoyed meeting Tatyanah Frane and look forward to participating in their care.  A copy of this report was sent to the requesting provider on this date.  Electronically Signed: Simya Tercero A 10/09/2015, 3:03 PM   I spent a total of 40 Minutes    in face to face in clinical consultation, greater than 50% of which was counseling/coordinating care for Sacroplasty

## 2015-10-09 NOTE — Care Management Important Message (Signed)
Important Message  Patient Details  Name: Nicole Roberts MRN: [id number] Date of Birth: 02/03/1922   Medicare Important Message Given:  Yes    Nicole Roberts 10/09/2015, 11:20 AM

## 2015-10-09 NOTE — Progress Notes (Addendum)
If patient discharging over weekend, [facility] will need discharge summary today if possible.  Percell Locus Hank Walling LCSWA [phone]

## 2015-10-09 NOTE — Progress Notes (Signed)
ANTICOAGULATION CONSULT NOTE - Initial Consult  Pharmacy Consult for Eliquis to Heparin Indication: atrial fibrillation  Allergies  Allergen Reactions  . Sulfa Antibiotics Itching  . Tramadol Hcl Itching and Rash    Patient Measurements: Height: 5\' 4"  (162.6 cm) Weight: 151 lb 14.4 oz (68.9 kg) IBW/kg (Calculated) : 54.7   Vital Signs: Temp: 98.1 F (36.7 C) (06/09 0510) Temp Source: Oral (06/09 0510) BP: 104/54 mmHg (06/09 0510) Pulse Rate: 90 (06/09 0510)  Labs:  Recent Labs  10/06/15 2116 10/07/15 1435 10/08/15 0512 10/08/15 1154 10/09/15 0546  HGB 11.3*  --  10.4*  --  9.7*  HCT 35.5*  --  32.9*  --  30.5*  PLT 190  --  146*  --  148*  APTT  --   --   --  29  --   LABPROT  --   --   --  16.1*  --   INR  --   --   --  1.27  --   CREATININE 1.50* 1.44* 1.30*  --  1.36*    Estimated Creatinine Clearance: 25.7 mL/min (by C-G formula based on Cr of 1.36).   Medical History: Past Medical History  Diagnosis Date  . HEARING LOSS   . MITRAL VALVE INSUFF&AORTIC VALVE INSUFF     moderate MR  . PULMONARY HYPERTENSION   . Atrial fibrillation ([place])     permanent  . SICK SINUS SYNDROME     a. Tachybrady syndrome - Guidant PPM 10/2005.  . Edema 03/19/2009    due to venous insufficiency, chronic lymphedema  . URINARY INCONTINENCE   . Rheumatoid arthritis(714.0) dx clarified 2011    On prednisone  . BREAST CANCER 12/2006    ductal ca s/p L lumpectomy  . Diastolic dysfunction   . HYPERTENSION   . HYPERLIPIDEMIA   . GERD   . HYPOTHYROIDISM   . Hyperparathyroidism     2 lobes removed  . Venous insufficiency     chronic BLE edema  . Spinal stenosis   . Anemia     a. Macrocytic - normal B12, folate 08/2011. b. 3/6 heme positive stools 10/2011 - per PCP note, elected against colonscopy.  [name] TIA (transient ischemic attack) 05/2011  . Anxiety      Assessment: Transitioning from Eliquis to heparin for planned procedure on Monday Last dose of Eliquis this AM at  9:30  Will use PTT until heparin level and PTT coorelate  Goal of Therapy:  Heparin level 0.3-0.7 units/ml Monitor platelets by anticoagulation protocol: Yes  PTT = 66 to 102 seconds   Plan:  Heparin to start at 800 units / hr starting at 10 pm Daily heparin level, PTT, CBC  Thank you Anette Guarneri, PharmD [phone] 10/09/2015,12:44 PM

## 2015-10-09 NOTE — Progress Notes (Signed)
*  Preliminary Results* Bilateral lower extremity venous duplex completed. The right lower extremity is positive for deep vein thrombosis involving the right posterior tibial and peroneal veins. There is no obvious evidence of deep vein thrombosis involving the visualized veins of the left lower extremity. There is no evidence of Baker's cyst bilaterally.  Preliminary results discussed with Barry Brunner, RN.  10/09/2015  Maudry Mayhew, RVT, RDCS, RDMS

## 2015-10-10 ENCOUNTER — Inpatient Hospital Stay (HOSPITAL_COMMUNITY): Payer: Medicare Other

## 2015-10-10 LAB — HEMOGLOBIN AND HEMATOCRIT, BLOOD
HEMATOCRIT: 24.7 % — AB (ref 36.0–46.0)
HEMATOCRIT: 24.8 % — AB (ref 36.0–46.0)
HEMOGLOBIN: 8 g/dL — AB (ref 12.0–15.0)
Hemoglobin: 7.8 g/dL — ABNORMAL LOW (ref 12.0–15.0)

## 2015-10-10 LAB — HEPARIN LEVEL (UNFRACTIONATED): Heparin Unfractionated: 1.94 IU/mL — ABNORMAL HIGH (ref 0.30–0.70)

## 2015-10-10 LAB — BASIC METABOLIC PANEL
Anion gap: 12 (ref 5–15)
BUN: 27 mg/dL — ABNORMAL HIGH (ref 6–20)
CHLORIDE: 101 mmol/L (ref 101–111)
CO2: 26 mmol/L (ref 22–32)
CREATININE: 1.66 mg/dL — AB (ref 0.44–1.00)
Calcium: 8.3 mg/dL — ABNORMAL LOW (ref 8.9–10.3)
GFR calc non Af Amer: 26 mL/min — ABNORMAL LOW (ref >60)
GFR, EST AFRICAN AMERICAN: 30 mL/min — AB (ref >60)
Glucose, Bld: 100 mg/dL — ABNORMAL HIGH (ref 65–99)
Potassium: 3.4 mmol/L — ABNORMAL LOW (ref 3.5–5.1)
Sodium: 139 mmol/L (ref 135–145)

## 2015-10-10 LAB — APTT
APTT: 95 s — AB (ref 24–37)
APTT: 126 s — AB (ref 24–37)

## 2015-10-10 LAB — CBC
HEMATOCRIT: 23.2 % — AB (ref 36.0–46.0)
HEMOGLOBIN: 7.8 g/dL — AB (ref 12.0–15.0)
MCH: 36.6 pg — ABNORMAL HIGH (ref 26.0–34.0)
MCHC: 33.6 g/dL (ref 30.0–36.0)
MCV: 108.9 fL — AB (ref 78.0–100.0)
Platelets: 162 10*3/uL (ref 150–400)
RBC: 2.13 MIL/uL — ABNORMAL LOW (ref 3.87–5.11)
RDW: 15.9 % — ABNORMAL HIGH (ref 11.5–15.5)
WBC: 19.5 10*3/uL — ABNORMAL HIGH (ref 4.0–10.5)

## 2015-10-10 LAB — FOLATE: FOLATE: 57.3 ng/mL (ref >5.9)

## 2015-10-10 LAB — OCCULT BLOOD X 1 CARD TO LAB, STOOL: FECAL OCCULT BLD: NEGATIVE

## 2015-10-10 LAB — PROCALCITONIN: PROCALCITONIN: 0.19 ng/mL

## 2015-10-10 LAB — VITAMIN B12: Vitamin B-12: 1135 pg/mL — ABNORMAL HIGH (ref 180–914)

## 2015-10-10 LAB — VANCOMYCIN, TROUGH: Vancomycin Tr: 15 ug/mL (ref 10.0–20.0)

## 2015-10-10 LAB — PREPARE RBC (CROSSMATCH)

## 2015-10-10 LAB — ABO/RH: ABO/RH(D): O POS

## 2015-10-10 MED ORDER — SODIUM CHLORIDE 0.9 % IV SOLN
Freq: Once | INTRAVENOUS | Status: AC
  Administered 2015-10-10: 250 mL via INTRAVENOUS

## 2015-10-10 MED ORDER — DIATRIZOATE MEGLUMINE & SODIUM 66-10 % PO SOLN
450.0000 mL | ORAL | Status: AC
  Administered 2015-10-10 (×2): 450 mL via ORAL
  Filled 2015-10-10 (×2): qty 450

## 2015-10-10 NOTE — Progress Notes (Signed)
[facility] for  Heparin Indication: atrial fibrillation  Allergies  Allergen Reactions  . Sulfa Antibiotics Itching  . Tramadol Hcl Itching and Rash    Patient Measurements: Height: 5\' 4"  (162.6 cm) Weight: 152 lb 1.9 oz (69 kg) IBW/kg (Calculated) : 54.7   Vital Signs: Temp: 98.1 F (36.7 C) (06/10 0600) Temp Source: Oral (06/10 0600) BP: 118/63 mmHg (06/10 0600) Pulse Rate: 98 (06/10 0600)  Labs:  Recent Labs  10/08/15 0512 10/08/15 1154 10/09/15 0546 10/10/15 0555  HGB 10.4*  --  9.7*  --   HCT 32.9*  --  30.5*  --   PLT 146*  --  148*  --   APTT  --  29  --  95*  LABPROT  --  16.1*  --   --   INR  --  1.27  --   --   CREATININE 1.30*  --  1.36* 1.66*    Estimated Creatinine Clearance: 21 mL/min (by C-G formula based on Cr of 1.66).  Assessment: 82 y.o. female with with h/o Afib, new DVT, awaiting kyphoplasty and Eliquis on hold, for heparin  Goal of Therapy:  Heparin level 0.3-0.7 units/ml Monitor platelets by anticoagulation protocol: Yes  PTT = 66 to 102 seconds   Plan:  Continue Heparin at current rate for now Recheck aPTT later this morning to confirm.  Phillis Knack, PharmD, BCPS  10/10/2015,6:41 AM

## 2015-10-10 NOTE — Progress Notes (Addendum)
[facility] for  Heparin Indication: atrial fibrillation, new DVT  Allergies  Allergen Reactions  . Sulfa Antibiotics Itching  . Tramadol Hcl Itching and Rash    Patient Measurements: Height: 5\' 4"  (162.6 cm) Weight: 152 lb 1.9 oz (69 kg) IBW/kg (Calculated) : 54.7   Vital Signs: Temp: 98.1 F (36.7 C) (06/10 0600) Temp Source: Oral (06/10 0600) BP: 118/63 mmHg (06/10 0600) Pulse Rate: 98 (06/10 0600)  Labs:  Recent Labs  10/08/15 0512 10/08/15 1154 10/09/15 0546 10/10/15 0555 10/10/15 0923  HGB 10.4*  --  9.7* 7.8* 7.8*  HCT 32.9*  --  30.5* 23.2* 24.7*  PLT 146*  --  148* 162  --   APTT  --  29  --  95* 126*  LABPROT  --  16.1*  --   --   --   INR  --  1.27  --   --   --   HEPARINUNFRC  --   --   --  1.94*  --   CREATININE 1.30*  --  1.36* 1.66*  --     Estimated Creatinine Clearance: 21 mL/min (by C-G formula based on Cr of 1.66).  Assessment: 82 y.o. female with with h/o Afib, new DVT, awaiting kyphoplasty and Eliquis on hold, for heparin  PTT trended up to 126, CBC trending down  Goal of Therapy:  Heparin level 0.3-0.7 units/ml Monitor platelets by anticoagulation protocol: Yes  PTT = 66 to 102 seconds   Plan:  Heparin to 650 units / hr Recheck PTT in 8 hours  Thank you Anette Guarneri, PharmD [phone]   10/10/2015,12:01 PM  1400 PM Heparin now stopped due to new bleed  Thank you Anette Guarneri, PharmD [phone]

## 2015-10-10 NOTE — Progress Notes (Addendum)
[facility]                                                                              Patient Demographics  Nicole Roberts, is a 82 y.o. female, DOB - 30-Nov-1921, [id number]  Admit date - 10/06/2015   Admitting Physician Phillips Grout, MD  Outpatient Primary MD for the patient is [name], MD  Outpatient specialists:   LOS - 4  days    Chief Complaint  Patient presents with  . Leg Pain  . Cellulitis       Brief summary   Patient is a 82 year old from [facility] skilled [facility] facility with history of atrial fibrillation, hypertension and hyperlipidemia, GERD, hypothyroidism, anemia, TIA presented to ED with cellulitis, leg pain. Patient apparently fell and hit her leg over 3 weeks ago and developed a skin tear. About a week prior to admission he started to get more red and erythematous. She was started on Keflex 2 days prior to admission. The redness worsened despite antibiotics over to the anterior leg up to the knee. No fevers. Patient was admitted for cellulitis.   Assessment & Plan    Principal Problem:   Cellulitis- - Outpatient failure to Keflex - Patient was placed on IV vancomycin and Zosyn - Wound care consulted  Active Problems: Acute on chronic back pain, recent fall, radiating to legs Likely due to sacral fracture - CT of the lumbar spine showed left sacral fracture - IR consulted for kyphoplasty, tentatively planning on Monday   Right leg pain   - Doppler ultrasound of LE positive for DVT in the right posterior tibial and peroneal vein.  - Patient was on eliquis with renal dosing prior to admission, will need to be either on Coumadin or xarelto, considering CKD. For now, eliquis on hold and placed on heparin drip per pharmacy. Will hold heparin drip prior to the procedure  Lower Abdominal pain, diarrhea - Abdominal x-ray 6/9 was negative for any SBO or perforation - Patient's son at the bedside feels her abdomen feels like  "garden hose", patient is not complaining of significant abdominal pain at this time. However on her lab exams, patient has worsening leukocytosis, hemoglobin down to 7.8 - Repeat H&H 7.8, obtain serial H&H today, stat CT abdomen and pelvis without contrast to rule out any retroperitoneal hematoma or infectious pathology. Obtain blood cultures, pro-calcitonin - C. difficile negative Addendum: 1:20pm Stat CT results: Acute extraperitoneal hemorrhage involving the right abdominal wall musculature with component of the rectus muscle measuring 6.7 cm and component within the oblique musculature measuring as great as 9.9 cm. - DC'ed heparin drip (called RN) - transfuse 1 unit packed RBC's now - serial H/H - Discussed with on call surgery Dr Donne Hazel who advised conservative management as above and abdominal binder, should resolved spontaneously. - Explained to patient's son in detail and answered all questions and concerns. - Will place IR consult for IVC filter as no anticoagulation for DVT due to large rectus sheath hematoma     Essential hypertension - Currently controlled - Continue furosemide, Imdur, metoprolol    Rheumatoid arthritis (HCC) - On chronic steroids  Atrial fibrillation (HCC) - Currently rate controlled - eliquis on hold, place on heparin drip per pharmacy    Mild acute on chronic Stage III chronic kidney disease - Baseline creatinine 1.3-1.4, currently at baseline    Chronic combined systolic and diastolic CHF (congestive heart failure) (HCC) -Currently compensated   Macrocytic anemia B12, folate within normal limits, check stool occult test   Code Status: dnr  DVT Prophylaxis: apixaban on hold, placing on heparin drip   Family Communication: Discussed in detail with the patient, all imaging results, lab results explained to the patient and son in detail at the bedside   Disposition Plan:   Time Spent in minutes   25 minutes  Procedures:  CT lumbar  spine  Consultants:   Interventional radiology  Antimicrobials:   IV vancomycin 6/6  IV Zosyn 6/7    Medications  Scheduled Meds: . calcium-vitamin D  1 tablet Oral BID  . cholecalciferol  1,000 Units Oral Daily  . citalopram  20 mg Oral Daily  . conjugated estrogens  1 Applicatorful Vaginal Once per day on Mon Wed Fri  . cyanocobalamin  500 mcg Oral Daily  . fentaNYL  75 mcg Transdermal Q72H  . folic acid  1 mg Oral q morning - 10a  . gabapentin  800 mg Oral QHS  . isosorbide mononitrate  15 mg Oral Daily  . magnesium oxide  400 mg Oral Daily  . metoprolol succinate  50 mg Oral BID  . multivitamin  1 tablet Oral Daily  . nystatin  5 mL Oral QID  . pantoprazole  40 mg Oral BID  . piperacillin-tazobactam (ZOSYN)  IV  3.375 g Intravenous Q8H  . potassium chloride SA  20 mEq Oral Daily  . predniSONE  5 mg Oral Q breakfast  . sodium chloride flush  3 mL Intravenous Q12H  . sucralfate  1 g Oral BID  . vancomycin  500 mg Intravenous Q24H   Continuous Infusions: . heparin 800 Units/hr (10/09/15 2329)   PRN Meds:.sodium chloride, HYDROcodone-acetaminophen, loperamide, nitroGLYCERIN, ondansetron (ZOFRAN) IV, sodium chloride flush   Antibiotics   Anti-infectives    Start     Dose/Rate Route Frequency Ordered Stop   10/07/15 2000  vancomycin (VANCOCIN) 500 mg in sodium chloride 0.9 % 100 mL IVPB     500 mg 100 mL/hr over 60 Minutes Intravenous Every 24 hours 10/07/15 0119     10/07/15 0400  piperacillin-tazobactam (ZOSYN) IVPB 3.375 g     3.375 g 12.5 mL/hr over 240 Minutes Intravenous Every 8 hours 10/07/15 0119     10/06/15 2045  piperacillin-tazobactam (ZOSYN) IVPB 3.375 g     3.375 g 100 mL/hr over 30 Minutes Intravenous  Once 10/06/15 2039 10/07/15 0018   10/06/15 2045  vancomycin (VANCOCIN) IVPB 1000 mg/200 mL premix     1,000 mg 200 mL/hr over 60 Minutes Intravenous  Once 10/06/15 2039 10/07/15 0018        Subjective:   Nicole Roberts was seen and  examined today.Per patient, that is improving. Abdominal pain in the lower quadrants. No fevers or chills.  Patient denies dizziness, chest pain, shortness of breath. No acute events overnight.    Objective:   Filed Vitals:   10/09/15 0654 10/09/15 1403 10/09/15 2137 10/10/15 0600  BP:  101/61 103/54 118/63  Pulse:  110 101 98  Temp:  98.3 F (36.8 C) 98.5 F (36.9 C) 98.1 F (36.7 C)  TempSrc:   Oral Oral  Resp:  18 16 16  Height:      Weight: 68.9 kg (151 lb 14.4 oz)   69 kg (152 lb 1.9 oz)  SpO2:  98% 98% 98%    Intake/Output Summary (Last 24 hours) at 10/10/15 1115 Last data filed at 10/10/15 1020  Gross per 24 hour  Intake 929.33 ml  Output      0 ml  Net 929.33 ml     Wt Readings from Last 3 Encounters:  10/10/15 69 kg (152 lb 1.9 oz)  08/10/15 70.217 kg (154 lb 12.8 oz)  06/08/15 74.844 kg (165 lb)     Exam  General: Alert and oriented, NAD  HEENT:   Neck:   Cardiovascular: S1 S2 auscultated, no rubs, murmurs or gallops. Regular rate and rhythm.  Respiratory: Clear to auscultation bilaterally, no wheezing, rales or rhonchi  Gastrointestinal: Soft, mildly tender in the lower quadrants on deep palpation. No rebound or guarding.   Ext: no cyanosis clubbing or edema  Neuro: no new deficits  Skin: Dressing intact on the lower extremities  Psych: alert and oriented   Data Reviewed:  I have personally reviewed following labs and imaging studies  Micro Results Recent Results (from the past 240 hour(s))  Blood Culture (routine x 2)     Status: None (Preliminary result)   Collection Time: 10/06/15  9:16 PM  Result Value Ref Range Status   Specimen Description BLOOD RIGHT ARM  Final   Special Requests BOTTLES DRAWN AEROBIC ONLY 10CC  Final   Culture NO GROWTH 3 DAYS  Final   Report Status PENDING  Incomplete  Blood Culture (routine x 2)     Status: Abnormal (Preliminary result)   Collection Time: 10/06/15  9:38 PM  Result Value Ref Range Status    Specimen Description BLOOD LEFT ANTECUBITAL  Final   Special Requests BOTTLES DRAWN AEROBIC ONLY 5CC  Final   Culture  Setup Time   Final    GRAM POSITIVE COCCI IN CLUSTERS AEROBIC BOTTLE ONLY CRITICAL RESULT CALLED TO, READ BACK BY AND VERIFIED WITH: C [facility] D [id number] M WILSON    Culture (A)  Final    STAPHYLOCOCCUS SPECIES (COAGULASE NEGATIVE) THE SIGNIFICANCE OF ISOLATING THIS ORGANISM FROM A SINGLE SET OF BLOOD CULTURES WHEN MULTIPLE SETS ARE DRAWN IS UNCERTAIN. PLEASE NOTIFY THE MICROBIOLOGY DEPARTMENT WITHIN ONE WEEK IF SPECIATION AND SENSITIVITIES ARE REQUIRED.    Report Status PENDING  Incomplete  Blood Culture ID Panel (Reflexed)     Status: Abnormal   Collection Time: 10/06/15  9:38 PM  Result Value Ref Range Status   Enterococcus species NOT DETECTED NOT DETECTED Final   Vancomycin resistance NOT DETECTED NOT DETECTED Final   Listeria monocytogenes NOT DETECTED NOT DETECTED Final   Staphylococcus species DETECTED (A) NOT DETECTED Corrected    Comment: CRITICAL RESULT CALLED TO, READ BACK BY AND VERIFIED WITH: CLamont Snowball. D. 11:35 10/08/15 ([email or web address]) CORRECTED ON 06/09 AT 1034: PREVIOUSLY REPORTED AS DETECTED C. Lamont Snowball. D. 11:35 10/08/15 ([email or web address])    Staphylococcus aureus NOT DETECTED NOT DETECTED Final   Methicillin resistance NOT DETECTED NOT DETECTED Final   Streptococcus species NOT DETECTED NOT DETECTED Final   Streptococcus agalactiae NOT DETECTED NOT DETECTED Final   Streptococcus pneumoniae NOT DETECTED NOT DETECTED Final   Streptococcus pyogenes NOT DETECTED NOT DETECTED Final   Acinetobacter baumannii NOT DETECTED NOT DETECTED Final   Enterobacteriaceae species NOT DETECTED NOT DETECTED Final   Enterobacter cloacae complex NOT DETECTED NOT DETECTED Final   Escherichia coli NOT  DETECTED NOT DETECTED Final   Klebsiella oxytoca NOT DETECTED NOT DETECTED Final   Klebsiella pneumoniae NOT DETECTED NOT DETECTED Final   Proteus species NOT DETECTED  NOT DETECTED Final   Serratia marcescens NOT DETECTED NOT DETECTED Final   Carbapenem resistance NOT DETECTED NOT DETECTED Final   Haemophilus influenzae NOT DETECTED NOT DETECTED Final   Neisseria meningitidis NOT DETECTED NOT DETECTED Final   Pseudomonas aeruginosa NOT DETECTED NOT DETECTED Final   Candida albicans NOT DETECTED NOT DETECTED Final   Candida glabrata NOT DETECTED NOT DETECTED Final   Candida krusei NOT DETECTED NOT DETECTED Final   Candida parapsilosis NOT DETECTED NOT DETECTED Final   Candida tropicalis NOT DETECTED NOT DETECTED Final  MRSA PCR Screening     Status: None   Collection Time: 10/07/15  1:08 AM  Result Value Ref Range Status   MRSA by PCR NEGATIVE NEGATIVE Final    Comment:        The GeneXpert MRSA Assay (FDA approved for NASAL specimens only), is one component of a comprehensive MRSA colonization surveillance program. It is not intended to diagnose MRSA infection nor to guide or monitor treatment for MRSA infections.   C difficile quick scan w PCR reflex     Status: None   Collection Time: 10/09/15 11:49 AM  Result Value Ref Range Status   C Diff antigen NEGATIVE NEGATIVE Final   C Diff toxin NEGATIVE NEGATIVE Final   C Diff interpretation Negative for toxigenic C. difficile  Final    Radiology Reports Dg Tibia/fibula Left  10/06/2015  CLINICAL DATA:  Fall 10 days ago. Skin tear with possible infection. Initial encounter. EXAM: LEFT TIBIA AND FIBULA - 2 VIEW COMPARISON:  None. FINDINGS: Soft tissue swelling greatest about the lateral lower leg. There is no soft tissue gas or opaque foreign body. Negative for fracture or signs of osteomyelitis. Osteopenia and knee osteoarthritis. Arterial and soft tissue calcifications. IMPRESSION: Soft tissue swelling without opaque foreign body, soft tissue emphysema, or evidence of osteomyelitis. Electronically Signed   By: Monte Fantasia M.D.   On: 10/06/2015 22:00   Ct Lumbar Spine Wo Contrast  10/07/2015   CLINICAL DATA:  Chronic low back pain. History of fall 3 weeks ago. No known injury. Initial encounter. EXAM: CT LUMBAR SPINE WITHOUT CONTRAST TECHNIQUE: Multidetector CT imaging of the lumbar spine was performed without intravenous contrast administration. Multiplanar CT image reconstructions were also generated. COMPARISON:  CT chest, abdomen and pelvis 05/14/2011. FINDINGS: Vertebral body height and alignment are maintained. No lytic or sclerotic bony lesion is identified. No pars interarticularis defect is seen. Advanced degenerative change is present about the sacroiliac joints. A subtle cortical lucency in the anterior aspect of the left sacrum on images 117-121 of series 8 and 96-99 of series 5 is worrisome for insufficiency fracture. No other evidence fracture is identified. Imaged intra-abdominal contents demonstrate aortoiliac atherosclerosis. Bilateral renal cysts are partially visualized. The patient is status post cholecystectomy. T10-11: Small calcified disc bulge without central canal or foraminal stenosis. T11-12: Loss of disc space height and facet degenerative change. The central canal appears open. Bilateral foraminal narrowing is seen. T12-L1: Broad-based disc bulge with endplate spur. The central canal and foramina appear open. L1-2: Shallow broad-based central protrusion with calcification of the annulus. The central canal and foramina appear open. L2-3: Mild disc bulge and endplate spur without central canal or foraminal stenosis. L3-4: There is some ligamentum flavum thickening and a broad-based disc bulge with endplate spur. Mild central canal narrowing  is seen. There is also some bilateral foraminal narrowing. L4-5: Disc bulge, endplate spur and facet arthropathy are seen. There is mild to moderate central canal narrowing. Marked bilateral foraminal narrowing is also seen. L5-S1: There is facet degenerative change and a shallow broad-based disc bulge. The central canal and right foramen are  open. Mild to moderate left foraminal narrowing is identified. IMPRESSION: Findings highly suspicious for a nondisplaced left sacral fracture. Negative for lumbar spine fracture. Multilevel degenerative disc disease as detailed above. Electronically Signed   By: Inge Rise M.D.   On: 10/07/2015 16:24   Dg Abd 2 Views  10/09/2015  CLINICAL DATA:  Abdominal pain.  Nausea and diarrhea for 1 day. EXAM: ABDOMEN - 2 VIEW COMPARISON:  CT of 01/23/2015 FINDINGS: Upright and right-sided decubitus views. Dual lead pacer. No free intraperitoneal air. No significant air fluid levels. Bilateral hip arthroplasty. No bowel distension. Cholecystectomy. No abnormal abdominal calcifications. No appendicolith. Vascular calcifications. Cardiomegaly. Right hemidiaphragm elevation. IMPRESSION: No acute findings. Electronically Signed   By: Abigail Miyamoto M.D.   On: 10/09/2015 18:13    Lab Data:  CBC:  Recent Labs Lab 10/06/15 2116 10/08/15 0512 10/09/15 0546 10/10/15 0555 10/10/15 0923  WBC 10.5 11.0* 17.2* 19.5*  --   NEUTROABS 8.3*  --   --   --   --   HGB 11.3* 10.4* 9.7* 7.8* 7.8*  HCT 35.5* 32.9* 30.5* 23.2* 24.7*  MCV 110.6* 111.9* 112.5* 108.9*  --   PLT 190 146* 148* 162  --    Basic Metabolic Panel:  Recent Labs Lab 10/06/15 2116 10/07/15 1435 10/08/15 0512 10/09/15 0546 10/10/15 0555  NA 140 141 142 142 139  K 4.5 4.3 3.6 3.3* 3.4*  CL 102 105 105 105 101  CO2 29 28 30 27 26   GLUCOSE 85 150* 95 88 100*  BUN 31* 28* 22* 21* 27*  CREATININE 1.50* 1.44* 1.30* 1.36* 1.66*  CALCIUM 9.0 8.7* 8.6* 8.4* 8.3*  MG  --  1.9  --   --   --    GFR: Estimated Creatinine Clearance: 21 mL/min (by C-G formula based on Cr of 1.66). Liver Function Tests:  Recent Labs Lab 10/06/15 2116 10/07/15 1435  AST 27 18  ALT 28 24  ALKPHOS 181* 169*  BILITOT 0.9 1.0  PROT 6.2* 5.6*  ALBUMIN 2.8* 2.5*   No results for input(s): LIPASE, AMYLASE in the last 168 hours. No results for input(s): AMMONIA  in the last 168 hours. Coagulation Profile:  Recent Labs Lab 10/08/15 1154  INR 1.27   Cardiac Enzymes: No results for input(s): CKTOTAL, CKMB, CKMBINDEX, TROPONINI in the last 168 hours. BNP (last 3 results)  Recent Labs  03/18/15 1643  PROBNP 288.0*   HbA1C: No results for input(s): HGBA1C in the last 72 hours. CBG: No results for input(s): GLUCAP in the last 168 hours. Lipid Profile: No results for input(s): CHOL, HDL, LDLCALC, TRIG, CHOLHDL, LDLDIRECT in the last 72 hours. Thyroid Function Tests: No results for input(s): TSH, T4TOTAL, FREET4, T3FREE, THYROIDAB in the last 72 hours. Anemia Panel:  Recent Labs  10/10/15 0923  VITAMINB12 1135*   Urine analysis:    Component Value Date/Time   COLORURINE YELLOW 03/18/2015 [place] 03/18/2015 1643   LABSPEC 1.010 03/18/2015 1643   PHURINE 6.0 03/18/2015 1643   GLUCOSEU NEGATIVE 03/18/2015 [place] 01/23/2015 1606   HGBUR NEGATIVE 03/18/2015 1643   HGBUR large 01/26/2010 [place] 03/18/2015  [place] 03/18/2015 [place] 01/23/2015 1606   UROBILINOGEN 0.2 03/18/2015 1643   NITRITE NEGATIVE 03/18/2015 1643   LEUKOCYTESUR NEGATIVE 03/18/2015 [place] M.D. [facility] 10/10/2015, 11:15 AM  Pager: [phone] Between 7am to 7pm - call Pager - [phone]  After 7pm go to [email or web address] - password [email or web address]  Call night coverage person covering after 7pm

## 2015-10-10 NOTE — Progress Notes (Addendum)
Pharmacy Antibiotic Note  Nicole Roberts is a 82 y.o. female admitted on 10/06/2015 with cellulitis.  Pharmacy has been consulted for vancomycin and zosyn dosing.   Vancomycin trough therapeutic at 15 this evening Day # 5 of broad spectrum antibiotics-- consider streamlining Cultures to date negative   Plan: Vancomycin 500 IV every 24 hours.  Goal trough 10-15 mcg/mL. Zosyn 3.375g IV q8h (4 hour infusion).  Height: 5\' 4"  (162.6 cm) Weight: 152 lb 1.9 oz (69 kg) IBW/kg (Calculated) : 54.7  Temp (24hrs), Avg:98 F (36.7 C), Min:98 F (36.7 C), Max:98.1 F (36.7 C)   Recent Labs Lab 10/06/15 2116 10/06/15 2140 10/06/15 2336 10/07/15 1435 10/08/15 0512 10/09/15 0546 10/10/15 0555 10/10/15 2133  WBC 10.5  --   --   --  11.0* 17.2* 19.5*  --   CREATININE 1.50*  --   --  1.44* 1.30* 1.36* 1.66*  --   LATICACIDVEN  --  1.35 1.50  --   --   --   --   --   VANCOTROUGH  --   --   --   --   --   --   --  15    Estimated Creatinine Clearance: 21 mL/min (by C-G formula based on Cr of 1.66).    Allergies  Allergen Reactions  . Sulfa Antibiotics Itching  . Tramadol Hcl Itching and Rash    Antimicrobials this admission: vanc 6/6>> Zosyn 6/6>>  Thank you Anette Guarneri, PharmD [phone]  10/10/2015 10:07 PM

## 2015-10-11 ENCOUNTER — Inpatient Hospital Stay (HOSPITAL_COMMUNITY): Payer: Medicare Other

## 2015-10-11 LAB — CULTURE, BLOOD (ROUTINE X 2): CULTURE: NO GROWTH

## 2015-10-11 LAB — TYPE AND SCREEN
ABO/RH(D): O POS
Antibody Screen: NEGATIVE
Unit division: 0

## 2015-10-11 LAB — HEMOGLOBIN AND HEMATOCRIT, BLOOD
HCT: 24.9 % — ABNORMAL LOW (ref 36.0–46.0)
HCT: 26.8 % — ABNORMAL LOW (ref 36.0–46.0)
Hemoglobin: 8.3 g/dL — ABNORMAL LOW (ref 12.0–15.0)
Hemoglobin: 8.8 g/dL — ABNORMAL LOW (ref 12.0–15.0)

## 2015-10-11 MED ORDER — FENTANYL CITRATE (PF) 100 MCG/2ML IJ SOLN
INTRAMUSCULAR | Status: AC
  Filled 2015-10-11: qty 2

## 2015-10-11 MED ORDER — IOPAMIDOL (ISOVUE-300) INJECTION 61%
INTRAVENOUS | Status: AC
  Filled 2015-10-11: qty 100

## 2015-10-11 MED ORDER — IOPAMIDOL (ISOVUE-300) INJECTION 61%
INTRAVENOUS | Status: AC
  Filled 2015-10-11: qty 50

## 2015-10-11 MED ORDER — MIDAZOLAM HCL 2 MG/2ML IJ SOLN
INTRAMUSCULAR | Status: AC | PRN
  Administered 2015-10-11 (×2): 0.5 mg via INTRAVENOUS

## 2015-10-11 MED ORDER — MIDAZOLAM HCL 2 MG/2ML IJ SOLN
INTRAMUSCULAR | Status: AC
  Filled 2015-10-11: qty 2

## 2015-10-11 MED ORDER — FENTANYL CITRATE (PF) 100 MCG/2ML IJ SOLN
INTRAMUSCULAR | Status: AC | PRN
  Administered 2015-10-11 (×2): 25 ug via INTRAVENOUS

## 2015-10-11 MED ORDER — LIDOCAINE HCL 1 % IJ SOLN
INTRAMUSCULAR | Status: AC
  Administered 2015-10-11: 10 mL
  Filled 2015-10-11: qty 20

## 2015-10-11 MED ORDER — AMOXICILLIN-POT CLAVULANATE 250-125 MG PO TABS
1.0000 | ORAL_TABLET | Freq: Two times a day (BID) | ORAL | Status: AC
  Administered 2015-10-11 – 2015-10-12 (×3): 1 via ORAL
  Filled 2015-10-11 (×3): qty 1

## 2015-10-11 NOTE — Progress Notes (Addendum)
Patient back from IR for IVC placement. Alert and oriented; no acute distress noted; dressing intact, clean, dry on right jugular site; family at bedside. Will continue to monitor.

## 2015-10-11 NOTE — Progress Notes (Signed)
Patient going to IR for IVC placement. Alert and oriented; no acute distress.

## 2015-10-11 NOTE — Progress Notes (Signed)
Referring Physician(s): Ripudeep Krystal Eaton  Supervising Physician: Jacqulynn Cadet  Patient Status: In-pt  Chief Complaint:  DVT Rectus sheath hematoma   HPI:  Our service consulted on Nicole Roberts on 10/09/2015 and she is on our schedule for Sacroplasty tomorrow by Dr. Estanislado Pandy  Unfortunately she developed a rectus sheath hematoma while on heparin drip (for DVT)  Hgb is stable currently.  Heparin drip has been discontinued.   Allergies: Sulfa antibiotics and Tramadol hcl  Medications: Prior to Admission medications   Medication Sig Start Date End Date Taking? Authorizing Provider  apixaban (ELIQUIS) 2.5 MG TABS tablet Take 1 tablet (2.5 mg total) by mouth 2 (two) times daily. 02/04/15  Yes Charlynne Cousins, MD  calcium citrate-vitamin D (CITRACAL+D) 315-200 MG-UNIT tablet Take 1 tablet by mouth 2 (two) times daily. Patient taking differently: Take 1 tablet by mouth 2 (two) times daily. Gummies 08/24/15  Yes [name], MD  cephALEXin (KEFLEX) 500 MG capsule Take 1 capsule (500 mg total) by mouth 2 (two) times daily. 10/05/15  Yes Lucretia Kern, DO  cholecalciferol (VITAMIN D) 1000 UNITS tablet Take 1,000 Units by mouth daily.   Yes Historical Provider, MD  citalopram (CELEXA) 20 MG tablet Take 20 mg by mouth daily.   Yes Historical Provider, MD  conjugated estrogens (PREMARIN) vaginal cream Place 1 Applicatorful vaginally 3 (three) times a week.   Yes Historical Provider, MD  fentaNYL (DURAGESIC - DOSED MCG/HR) 75 MCG/HR Place 1 patch (75 mcg total) onto the skin every 3 (three) days. 09/14/15  Yes [name], MD  folic acid (FOLVITE) A999333 MCG tablet Take 400 mcg by mouth every morning.   Yes Historical Provider, MD  furosemide (LASIX) 20 MG tablet Take 2 tablets (40 mg total) by mouth daily. Until directed to decrease back to 20 mg daily 03/18/15  Yes [name], MD  gabapentin (NEURONTIN) 400 MG capsule Take 2 capsules (800 mg total) by mouth at bedtime. 12/02/14  Yes  Rowe Clack, MD  HYDROcodone-acetaminophen (NORCO/VICODIN) 5-325 MG tablet Take 1 tablet by mouth every 6 (six) hours as needed for moderate pain.   Yes Historical Provider, MD  isosorbide mononitrate (IMDUR) 30 MG 24 hr tablet Take 15 mg by mouth daily.   Yes Historical Provider, MD  levothyroxine (SYNTHROID, LEVOTHROID) 50 MCG tablet Take 1 tablet (50 mcg total) by mouth daily. Patient taking differently: Take 50 mcg by mouth daily before breakfast.  06/09/14  Yes Rowe Clack, MD  magnesium oxide (MAG-OX) 400 MG tablet Take 400 mg by mouth daily.   Yes Historical Provider, MD  metoprolol succinate (TOPROL-XL) 50 MG 24 hr tablet Take 1 tablet (50 mg total) by mouth 2 (two) times daily. Take with or immediately following a meal. 06/02/14  Yes Rowe Clack, MD  Multiple Vitamin (DAILY-VITE) TABS TAKE ONE TABLET BY MOUTH ONCE DAILY. 07/17/15  Yes [name], MD  nitroGLYCERIN (NITROSTAT) 0.4 MG SL tablet Place 0.4 mg under the tongue every 5 (five) minutes x 3 doses as needed for chest pain. 03/20/12  Yes Roger A Arguello, PA-C  nystatin (MYCOSTATIN) 100000 UNIT/ML suspension Take 5 mLs (500,000 Units total) by mouth 4 (four) times daily. Use for two days after symptoms have resolved and then stop 09/21/15  Yes [name], MD  pantoprazole (PROTONIX) 40 MG tablet Take 1 tablet (40 mg total) by mouth 2 (two) times daily. 08/24/15  Yes [name], MD  potassium chloride SA (K-DUR,KLOR-CON)  20 MEQ tablet TAKE ONE TABLET BY MOUTH ONCE DAILY. 06/26/15  Yes [name], MD  predniSONE (DELTASONE) 5 MG tablet Take 5 mg by mouth daily with breakfast.   Yes Historical Provider, MD  sucralfate (CARAFATE) 1 GM/10ML suspension Take 1 g by mouth 2 (two) times daily.   Yes Historical Provider, MD  vitamin B-12 (CYANOCOBALAMIN) 500 MCG tablet TAKE ONE TABLET BY MOUTH ONCE DAILY. 11/20/13  Yes Rowe Clack, MD     Vital Signs: BP 139/84 mmHg  Pulse 95  Temp(Src) 97.7 F (36.5 C)  (Oral)  Resp 18  Ht 5\' 4"  (1.626 m)  Wt 153 lb 6.4 oz (69.582 kg)  BMI 26.32 kg/m2  SpO2 94%  Physical Exam  Constitutional: She is oriented to person, place, and time. She appears well-developed and well-nourished.  Eyes: EOM are normal.  Cardiovascular: Normal rate, regular rhythm and normal heart sounds.   Pulmonary/Chest: Effort normal and breath sounds normal.  Abdominal:  Abdominal binder in place  Musculoskeletal:  Non-ambulatory  Neurological: She is alert and oriented to person, place, and time.  Skin: Skin is warm and dry.  Psychiatric: She has a normal mood and affect. Her behavior is normal. Judgment and thought content normal.  Vitals reviewed.   Imaging: Ct Abdomen Pelvis Wo Contrast  10/10/2015  CLINICAL DATA:  82 year old female with a history of fall and right-sided abdominal pain. EXAM: CT ABDOMEN AND PELVIS WITHOUT CONTRAST TECHNIQUE: Multidetector CT imaging of the abdomen and pelvis was performed following the standard protocol without IV contrast. COMPARISON:  Prior CT 01/23/2015 FINDINGS: Lower chest: Unremarkable appearance of the soft tissues of the chest wall. Heart size within normal limits.  No pericardial fluid/thickening. Pacing leads partially visualized. Calcifications of aortic valve and coronary calcifications. No lower mediastinal adenopathy. Fluid filled thoracic esophagus.  Small hiatal hernia. No confluent airspace disease, pleural fluid, or pneumothorax within visualized lung. Atelectasis/scarring at the lung bases. Abdomen/pelvis: Unremarkable appearance of liver and spleen. Unremarkable appearance of bilateral adrenal glands. No peripancreatic or pericholecystic fluid or inflammatory changes. Cholecystectomy. No intrahepatic or extrahepatic biliary ductal dilatation. Punctate gas within left-sided biliary ducts. No intra-peritoneal free air or significant free-fluid. No abnormally dilated small bowel or colon. No transition point. No inflammatory  changes of the mesenteries. Enteric contrast reaches the distal colon. Colonic diverticula without evidence of associated inflammatory changes. Normal appendix. Sagittal images demonstrate rectocele. Right Kidney/Ureter: No hydronephrosis. No nephrolithiasis. No perinephric stranding. Unremarkable course of the right ureter. Low-density cystic structure of the lateral right cortex, compatible with benign cyst. Left Kidney/Ureter: No hydronephrosis. No nephrolithiasis. No perinephric stranding. Unremarkable course of the left ureter. Low-density cystic structure of the inferior left cortex, compatible with benign cyst. Unremarkable appearance of the urinary bladder. Hyperdense hematoma within the right abdominal wall musculature, with the component in the rectus musculature measuring 5.3 cm x 6.7 cm. Second more superior component within the oblique musculature on the right measuring 4.0 cm by 9.9 cm. Scattered vascular calcifications of the abdominal aorta. Calcifications of the mesenteric vasculature. Musculoskeletal: Surgical changes of prior bilateral hip arthroplasty. Multilevel degenerative changes of the visualized spine. Acute angulation of the sacrum at the third sacral element. Diffuse osteopenia of the lumbar vertebral levels. Vertebral body heights maintained. IMPRESSION: Acute extraperitoneal hemorrhage involving the right abdominal wall musculature with component of the rectus muscle measuring 6.7 cm and component within the oblique musculature measuring as great as 9.9 cm. Acute angulation at the third sacral element, compatible with fracture. Pelvic floor  insufficiency with rectocele. Atherosclerosis and coronary artery disease. These results were called by telephone at the time of interpretation on 10/10/2015 at 1:09 pm to the nurse caring for the patient, Nicole Roberts who verbally acknowledged these results. Signed, Dulcy Fanny. Earleen Newport, DO [facility]  Radiology Electronically Signed   By: Corrie Mckusick D.O.   On: 10/10/2015 13:11   Ct Lumbar Spine Wo Contrast  10/07/2015  CLINICAL DATA:  Chronic low back pain. History of fall 3 weeks ago. No known injury. Initial encounter. EXAM: CT LUMBAR SPINE WITHOUT CONTRAST TECHNIQUE: Multidetector CT imaging of the lumbar spine was performed without intravenous contrast administration. Multiplanar CT image reconstructions were also generated. COMPARISON:  CT chest, abdomen and pelvis 05/14/2011. FINDINGS: Vertebral body height and alignment are maintained. No lytic or sclerotic bony lesion is identified. No pars interarticularis defect is seen. Advanced degenerative change is present about the sacroiliac joints. A subtle cortical lucency in the anterior aspect of the left sacrum on images 117-121 of series 8 and 96-99 of series 5 is worrisome for insufficiency fracture. No other evidence fracture is identified. Imaged intra-abdominal contents demonstrate aortoiliac atherosclerosis. Bilateral renal cysts are partially visualized. The patient is status post cholecystectomy. T10-11: Small calcified disc bulge without central canal or foraminal stenosis. T11-12: Loss of disc space height and facet degenerative change. The central canal appears open. Bilateral foraminal narrowing is seen. T12-L1: Broad-based disc bulge with endplate spur. The central canal and foramina appear open. L1-2: Shallow broad-based central protrusion with calcification of the annulus. The central canal and foramina appear open. L2-3: Mild disc bulge and endplate spur without central canal or foraminal stenosis. L3-4: There is some ligamentum flavum thickening and a broad-based disc bulge with endplate spur. Mild central canal narrowing is seen. There is also some bilateral foraminal narrowing. L4-5: Disc bulge, endplate spur and facet arthropathy are seen. There is mild to moderate central canal narrowing. Marked bilateral foraminal narrowing is also  seen. L5-S1: There is facet degenerative change and a shallow broad-based disc bulge. The central canal and right foramen are open. Mild to moderate left foraminal narrowing is identified. IMPRESSION: Findings highly suspicious for a nondisplaced left sacral fracture. Negative for lumbar spine fracture. Multilevel degenerative disc disease as detailed above. Electronically Signed   By: Inge Rise M.D.   On: 10/07/2015 16:24   Dg Abd 2 Views  10/09/2015  CLINICAL DATA:  Abdominal pain.  Nausea and diarrhea for 1 day. EXAM: ABDOMEN - 2 VIEW COMPARISON:  CT of 01/23/2015 FINDINGS: Upright and right-sided decubitus views. Dual lead pacer. No free intraperitoneal air. No significant air fluid levels. Bilateral hip arthroplasty. No bowel distension. Cholecystectomy. No abnormal abdominal calcifications. No appendicolith. Vascular calcifications. Cardiomegaly. Right hemidiaphragm elevation. IMPRESSION: No acute findings. Electronically Signed   By: Abigail Miyamoto M.D.   On: 10/09/2015 18:13    Labs:  CBC:  Recent Labs  10/06/15 2116 10/08/15 0512 10/09/15 0546 10/10/15 0555 10/10/15 0923 10/10/15 1433 10/11/15 0035  WBC 10.5 11.0* 17.2* 19.5*  --   --   --   HGB 11.3* 10.4* 9.7* 7.8* 7.8* 8.0* 8.8*  HCT 35.5* 32.9* 30.5* 23.2* 24.7* 24.8* 26.8*  PLT 190 146* 148* 162  --   --   --     COAGS:  Recent Labs  10/08/15 1154 10/10/15 0555 10/10/15 0923  INR 1.27  --   --   APTT 29 95* 126*    BMP:  Recent Labs  10/07/15  1435 10/08/15 0512 10/09/15 0546 10/10/15 0555  NA 141 142 142 139  K 4.3 3.6 3.3* 3.4*  CL 105 105 105 101  CO2 28 30 27 26   GLUCOSE 150* 95 88 100*  BUN 28* 22* 21* 27*  CALCIUM 8.7* 8.6* 8.4* 8.3*  CREATININE 1.44* 1.30* 1.36* 1.66*  GFRNONAA 31* 35* 33* 26*  GFRAA 36* 40* 38* 30*    LIVER FUNCTION TESTS:  Recent Labs  03/18/15 1643 06/08/15 1418 10/06/15 2116 10/07/15 1435  BILITOT 0.5 0.9 0.9 1.0  AST 21 18 27 18   ALT 19 24 28 24   ALKPHOS  76 54 181* 169*  PROT 6.6 6.2 6.2* 5.6*  ALBUMIN 3.8 3.6 2.8* 2.5*    Assessment and Plan:  DVT = was on Eliquis   Eliquis held for upcoming Sacroplasty and Heparin drip started to bridge. Now with rectus sheath hematoma.  Agree with D/C Heparin and will place IVC filter today by Dr. Laurence Ferrari.  Can proceed with Sacroplasty as scheduled.  Risks and Benefits discussed with the patient including, but not limited to bleeding, infection, contrast induced renal failure, filter fracture or migration which can lead to emergency surgery or even death, strut penetration with damage or irritation to adjacent structures and caval thrombosis.  All of the patient's questions were answered, patient is agreeable to proceed. Consent signed and in chart.  *I attempted to contact patient's son. Left a message informing him of planned IVC Filter today*   Electronically Signed: Murrell Redden PA-C 10/11/2015, 8:47 AM   I spent a total of 15 Minutes at the the patient's bedside AND on the patient's hospital floor or unit, greater than 50% of which was counseling/coordinating care for IVC filter

## 2015-10-11 NOTE — Sedation Documentation (Signed)
Patient denies pain and is resting comfortably.  

## 2015-10-11 NOTE — Progress Notes (Signed)
[facility]                                                                              Patient Demographics  Nicole Roberts, is a 82 y.o. female, DOB - 03-22-1922, [id number]  Admit date - 10/06/2015   Admitting Physician Phillips Grout, MD  Outpatient Primary MD for the patient is [name], MD  Outpatient specialists:   LOS - 5  days    Chief Complaint  Patient presents with  . Leg Pain  . Cellulitis       Brief summary   Patient is a 82 year old from [place] [facility] with history of atrial fibrillation, hypertension and hyperlipidemia, GERD, hypothyroidism, anemia, TIA presented to ED with cellulitis, leg pain. Patient apparently fell and hit her leg over 3 weeks ago and developed a skin tear. About a week prior to admission he started to get more red and erythematous. She was started on Keflex 2 days prior to admission. The redness worsened despite antibiotics over to the anterior leg up to the knee. No fevers. Patient was admitted for cellulitis.   Assessment & Plan    Principal Problem:   Cellulitis- - Outpatient failure to Keflex - cont IV vancomycin and Zosyn - Wound care consulted  Active Problems: Acute on chronic back pain, recent fall, radiating to legs Likely due to sacral fracture - CT of the lumbar spine showed left sacral fracture - IR consulted for kyphoplasty, tentatively planning on Monday, 6/12   Right leg pain  due to right leg DVT - Doppler ultrasound of LE positive for DVT in the right posterior tibial and peroneal vein.  - Patient was on eliquis with renal dosing prior to admission, and, then placed on IV heparin drip now discontinued due to rectus sheath hematoma  - IR to place IVC filter  Rectus sheath abd hematoma - Abdominal x-ray 6/9 was negative for any SBO or perforation - CT abd : Acute extraperitoneal hemorrhage/rectus sheath hematoma 6.7x 9.9cm - Conservative management per surgery,  discussed with on call surgery Dr Donne Hazel on 6/10 - Continue abdominal binder, heparin drip discontinued on 6/10 - Transfused 1 unit, H&H stable    Essential hypertension - Currently controlled - Continue furosemide, Imdur, metoprolol    Rheumatoid arthritis ([place]) - On chronic steroids    Atrial fibrillation (HCC) - Currently rate controlled currently not on any anticoagulation due to abdominal wall hematoma    Mild acute on chronic Stage III chronic kidney disease - Baseline creatinine 1.3-1.4, currently at baseline    Chronic combined systolic and diastolic CHF (congestive heart failure) (HCC) -Currently compensated   Macrocytic anemia B12, folate within normal limits, check stool occult test   Code Status: dnr  DVT Prophylaxis: apixaban on hold, heparin drip on hold. DVT in right LE   Family Communication: Discussed in detail with the patient, all imaging results, lab results explained to the patient    Disposition Plan:   Time Spent in minutes   25 minutes  Procedures:  CT lumbar spine CT abdomen Doppler ultrasound of the lower extremities  Consultants:   Interventional radiology  Antimicrobials:   IV vancomycin 6/6  IV Zosyn 6/7    Medications  Scheduled Meds: . calcium-vitamin D  1 tablet Oral BID  . cholecalciferol  1,000 Units Oral Daily  . citalopram  20 mg Oral Daily  . conjugated estrogens  1 Applicatorful Vaginal Once per day on Mon Wed Fri  . fentaNYL  75 mcg Transdermal Q72H  . fentaNYL      . folic acid  1 mg Oral q morning - 10a  . gabapentin  800 mg Oral QHS  . iopamidol      . isosorbide mononitrate  15 mg Oral Daily  . magnesium oxide  400 mg Oral Daily  . metoprolol succinate  50 mg Oral BID  . midazolam      . multivitamin  1 tablet Oral Daily  . nystatin  5 mL Oral QID  . pantoprazole  40 mg Oral BID  . piperacillin-tazobactam (ZOSYN)  IV  3.375 g Intravenous Q8H  . potassium chloride SA  20 mEq Oral Daily  . predniSONE   5 mg Oral Q breakfast  . sodium chloride flush  3 mL Intravenous Q12H  . sucralfate  1 g Oral BID  . vancomycin  500 mg Intravenous Q24H  . vitamin B-12  500 mcg Oral Daily   Continuous Infusions:   PRN Meds:.sodium chloride, HYDROcodone-acetaminophen, loperamide, nitroGLYCERIN, ondansetron (ZOFRAN) IV, sodium chloride flush   Antibiotics   Anti-infectives    Start     Dose/Rate Route Frequency Ordered Stop   10/07/15 2000  vancomycin (VANCOCIN) 500 mg in sodium chloride 0.9 % 100 mL IVPB     500 mg 100 mL/hr over 60 Minutes Intravenous Every 24 hours 10/07/15 0119     10/07/15 0400  piperacillin-tazobactam (ZOSYN) IVPB 3.375 g     3.375 g 12.5 mL/hr over 240 Minutes Intravenous Every 8 hours 10/07/15 0119     10/06/15 2045  piperacillin-tazobactam (ZOSYN) IVPB 3.375 g     3.375 g 100 mL/hr over 30 Minutes Intravenous  Once 10/06/15 2039 10/07/15 0018   10/06/15 2045  vancomycin (VANCOCIN) IVPB 1000 mg/200 mL premix     1,000 mg 200 mL/hr over 60 Minutes Intravenous  Once 10/06/15 2039 10/07/15 0018        Subjective:   Nicole Roberts was seen and examined today. No new complaints. Currently no abdominal pain. No fevers or chills.  Patient denies dizziness, chest pain, shortness of breath. No acute events overnight.    Objective:   Filed Vitals:   10/11/15 0543 10/11/15 1112 10/11/15 1120 10/11/15 1133  BP: 139/84 144/74 148/71 138/77  Pulse: 95 101 92 90  Temp: 97.7 F (36.5 C)     TempSrc:      Resp: 18 15 13 13   Height:      Weight: 69.582 kg (153 lb 6.4 oz)     SpO2: 94% 100% 100% 99%    Intake/Output Summary (Last 24 hours) at 10/11/15 1301 Last data filed at 10/11/15 1256  Gross per 24 hour  Intake    955 ml  Output      0 ml  Net    955 ml     Wt Readings from Last 3 Encounters:  10/11/15 69.582 kg (153 lb 6.4 oz)  08/10/15 70.217 kg (154 lb 12.8 oz)  06/08/15 74.844 kg (165 lb)     Exam  General: Alert and oriented x 3, NAD  HEENT:    Neck:   Cardiovascular: S1 S2 auscultated, no  rubs, murmurs or gallops. Regular rate and rhythm.  Respiratory: Clear to auscultation bilaterally, no wheezing, rales or rhonchi  Gastrointestinal: Abdominal binder on  Ext: no cyanosis clubbing or edema  Neuro: no new deficits  Skin: Dressing intact on the lower extremities  Psych: alert and oriented   Data Reviewed:  I have personally reviewed following labs and imaging studies  Micro Results Recent Results (from the past 240 hour(s))  Blood Culture (routine x 2)     Status: None   Collection Time: 10/06/15  9:16 PM  Result Value Ref Range Status   Specimen Description BLOOD RIGHT ARM  Final   Special Requests BOTTLES DRAWN AEROBIC ONLY 10CC  Final   Culture NO GROWTH 5 DAYS  Final   Report Status 10/11/2015 FINAL  Final  Blood Culture (routine x 2)     Status: Abnormal   Collection Time: 10/06/15  9:38 PM  Result Value Ref Range Status   Specimen Description BLOOD LEFT ANTECUBITAL  Final   Special Requests BOTTLES DRAWN AEROBIC ONLY 5CC  Final   Culture  Setup Time   Final    GRAM POSITIVE COCCI IN CLUSTERS AEROBIC BOTTLE ONLY CRITICAL RESULT CALLED TO, READ BACK BY AND VERIFIED WITH: C STEWART PHARM D [id number] M WILSON    Culture (A)  Final    STAPHYLOCOCCUS SPECIES (COAGULASE NEGATIVE) THE SIGNIFICANCE OF ISOLATING THIS ORGANISM FROM A SINGLE SET OF BLOOD CULTURES WHEN MULTIPLE SETS ARE DRAWN IS UNCERTAIN. PLEASE NOTIFY THE MICROBIOLOGY DEPARTMENT WITHIN ONE WEEK IF SPECIATION AND SENSITIVITIES ARE REQUIRED.    Report Status 10/11/2015 FINAL  Final  Blood Culture ID Panel (Reflexed)     Status: Abnormal   Collection Time: 10/06/15  9:38 PM  Result Value Ref Range Status   Enterococcus species NOT DETECTED NOT DETECTED Final   Vancomycin resistance NOT DETECTED NOT DETECTED Final   Listeria monocytogenes NOT DETECTED NOT DETECTED Final   Staphylococcus species DETECTED (A) NOT DETECTED Corrected    Comment:  CRITICAL RESULT CALLED TO, READ BACK BY AND VERIFIED WITH: CLamont Snowball. D. 11:35 10/08/15 (wilsonm) CORRECTED ON 06/09 AT 1034: PREVIOUSLY REPORTED AS DETECTED C. Lamont Snowball. D. 11:35 10/08/15 (wilsonm)    Staphylococcus aureus NOT DETECTED NOT DETECTED Final   Methicillin resistance NOT DETECTED NOT DETECTED Final   Streptococcus species NOT DETECTED NOT DETECTED Final   Streptococcus agalactiae NOT DETECTED NOT DETECTED Final   Streptococcus pneumoniae NOT DETECTED NOT DETECTED Final   Streptococcus pyogenes NOT DETECTED NOT DETECTED Final   Acinetobacter baumannii NOT DETECTED NOT DETECTED Final   Enterobacteriaceae species NOT DETECTED NOT DETECTED Final   Enterobacter cloacae complex NOT DETECTED NOT DETECTED Final   Escherichia coli NOT DETECTED NOT DETECTED Final   Klebsiella oxytoca NOT DETECTED NOT DETECTED Final   Klebsiella pneumoniae NOT DETECTED NOT DETECTED Final   Proteus species NOT DETECTED NOT DETECTED Final   Serratia marcescens NOT DETECTED NOT DETECTED Final   Carbapenem resistance NOT DETECTED NOT DETECTED Final   Haemophilus influenzae NOT DETECTED NOT DETECTED Final   Neisseria meningitidis NOT DETECTED NOT DETECTED Final   Pseudomonas aeruginosa NOT DETECTED NOT DETECTED Final   Candida albicans NOT DETECTED NOT DETECTED Final   Candida glabrata NOT DETECTED NOT DETECTED Final   Candida krusei NOT DETECTED NOT DETECTED Final   Candida parapsilosis NOT DETECTED NOT DETECTED Final   Candida tropicalis NOT DETECTED NOT DETECTED Final  MRSA PCR Screening     Status: None   Collection Time:  10/07/15  1:08 AM  Result Value Ref Range Status   MRSA by PCR NEGATIVE NEGATIVE Final    Comment:        The GeneXpert MRSA Assay (FDA approved for NASAL specimens only), is one component of a comprehensive MRSA colonization surveillance program. It is not intended to diagnose MRSA infection nor to guide or monitor treatment for MRSA infections.   C difficile  quick scan w PCR reflex     Status: None   Collection Time: 10/09/15 11:49 AM  Result Value Ref Range Status   C Diff antigen NEGATIVE NEGATIVE Final   C Diff toxin NEGATIVE NEGATIVE Final   C Diff interpretation Negative for toxigenic C. difficile  Final  Culture, blood (routine x 2)     Status: None (Preliminary result)   Collection Time: 10/10/15  9:30 AM  Result Value Ref Range Status   Specimen Description BLOOD RIGHT ANTECUBITAL  Final   Special Requests BOTTLES DRAWN AEROBIC ONLY 10CC  Final   Culture NO GROWTH 1 DAY  Final   Report Status PENDING  Incomplete  Culture, blood (routine x 2)     Status: None (Preliminary result)   Collection Time: 10/10/15  9:40 AM  Result Value Ref Range Status   Specimen Description BLOOD BLOOD RIGHT HAND  Final   Special Requests BOTTLES DRAWN AEROBIC ONLY 5CC  Final   Culture NO GROWTH 1 DAY  Final   Report Status PENDING  Incomplete  Culture, blood (routine x 2)     Status: None (Preliminary result)   Collection Time: 10/10/15  2:25 PM  Result Value Ref Range Status   Specimen Description BLOOD RIGHT ANTECUBITAL  Final   Special Requests BOTTLES DRAWN AEROBIC AND ANAEROBIC 5CC  Final   Culture NO GROWTH < 24 HOURS  Final   Report Status PENDING  Incomplete  Culture, blood (routine x 2)     Status: None (Preliminary result)   Collection Time: 10/10/15  2:35 PM  Result Value Ref Range Status   Specimen Description BLOOD RIGHT ANTECUBITAL  Final   Special Requests BOTTLES DRAWN AEROBIC AND ANAEROBIC 10CC  Final   Culture NO GROWTH < 24 HOURS  Final   Report Status PENDING  Incomplete    Radiology Reports Ct Abdomen Pelvis Wo Contrast  10/10/2015  CLINICAL DATA:  82 year old female with a history of fall and right-sided abdominal pain. EXAM: CT ABDOMEN AND PELVIS WITHOUT CONTRAST TECHNIQUE: Multidetector CT imaging of the abdomen and pelvis was performed following the standard protocol without IV contrast. COMPARISON:  Prior CT 01/23/2015  FINDINGS: Lower chest: Unremarkable appearance of the soft tissues of the chest wall. Heart size within normal limits.  No pericardial fluid/thickening. Pacing leads partially visualized. Calcifications of aortic valve and coronary calcifications. No lower mediastinal adenopathy. Fluid filled thoracic esophagus.  Small hiatal hernia. No confluent airspace disease, pleural fluid, or pneumothorax within visualized lung. Atelectasis/scarring at the lung bases. Abdomen/pelvis: Unremarkable appearance of liver and spleen. Unremarkable appearance of bilateral adrenal glands. No peripancreatic or pericholecystic fluid or inflammatory changes. Cholecystectomy. No intrahepatic or extrahepatic biliary ductal dilatation. Punctate gas within left-sided biliary ducts. No intra-peritoneal free air or significant free-fluid. No abnormally dilated small bowel or colon. No transition point. No inflammatory changes of the mesenteries. Enteric contrast reaches the distal colon. Colonic diverticula without evidence of associated inflammatory changes. Normal appendix. Sagittal images demonstrate rectocele. Right Kidney/Ureter: No hydronephrosis. No nephrolithiasis. No perinephric stranding. Unremarkable course of the right ureter. Low-density cystic structure of  the lateral right cortex, compatible with benign cyst. Left Kidney/Ureter: No hydronephrosis. No nephrolithiasis. No perinephric stranding. Unremarkable course of the left ureter. Low-density cystic structure of the inferior left cortex, compatible with benign cyst. Unremarkable appearance of the urinary bladder. Hyperdense hematoma within the right abdominal wall musculature, with the component in the rectus musculature measuring 5.3 cm x 6.7 cm. Second more superior component within the oblique musculature on the right measuring 4.0 cm by 9.9 cm. Scattered vascular calcifications of the abdominal aorta. Calcifications of the mesenteric vasculature. Musculoskeletal: Surgical  changes of prior bilateral hip arthroplasty. Multilevel degenerative changes of the visualized spine. Acute angulation of the sacrum at the third sacral element. Diffuse osteopenia of the lumbar vertebral levels. Vertebral body heights maintained. IMPRESSION: Acute extraperitoneal hemorrhage involving the right abdominal wall musculature with component of the rectus muscle measuring 6.7 cm and component within the oblique musculature measuring as great as 9.9 cm. Acute angulation at the third sacral element, compatible with fracture. Pelvic floor insufficiency with rectocele. Atherosclerosis and coronary artery disease. These results were called by telephone at the time of interpretation on 10/10/2015 at 1:09 pm to the nurse caring for the patient, Ms Alphonsus Sias who verbally acknowledged these results. Signed, Dulcy Fanny. Earleen Newport, DO [facility] Electronically Signed   By: Corrie Mckusick D.O.   On: 10/10/2015 13:11   Dg Tibia/fibula Left  10/06/2015  CLINICAL DATA:  Fall 10 days ago. Skin tear with possible infection. Initial encounter. EXAM: LEFT TIBIA AND FIBULA - 2 VIEW COMPARISON:  None. FINDINGS: Soft tissue swelling greatest about the lateral lower leg. There is no soft tissue gas or opaque foreign body. Negative for fracture or signs of osteomyelitis. Osteopenia and knee osteoarthritis. Arterial and soft tissue calcifications. IMPRESSION: Soft tissue swelling without opaque foreign body, soft tissue emphysema, or evidence of osteomyelitis. Electronically Signed   By: Monte Fantasia M.D.   On: 10/06/2015 22:00   Ct Lumbar Spine Wo Contrast  10/07/2015  CLINICAL DATA:  Chronic low back pain. History of fall 3 weeks ago. No known injury. Initial encounter. EXAM: CT LUMBAR SPINE WITHOUT CONTRAST TECHNIQUE: Multidetector CT imaging of the lumbar spine was performed without intravenous contrast administration. Multiplanar CT image reconstructions were also  generated. COMPARISON:  CT chest, abdomen and pelvis 05/14/2011. FINDINGS: Vertebral body height and alignment are maintained. No lytic or sclerotic bony lesion is identified. No pars interarticularis defect is seen. Advanced degenerative change is present about the sacroiliac joints. A subtle cortical lucency in the anterior aspect of the left sacrum on images 117-121 of series 8 and 96-99 of series 5 is worrisome for insufficiency fracture. No other evidence fracture is identified. Imaged intra-abdominal contents demonstrate aortoiliac atherosclerosis. Bilateral renal cysts are partially visualized. The patient is status post cholecystectomy. T10-11: Small calcified disc bulge without central canal or foraminal stenosis. T11-12: Loss of disc space height and facet degenerative change. The central canal appears open. Bilateral foraminal narrowing is seen. T12-L1: Broad-based disc bulge with endplate spur. The central canal and foramina appear open. L1-2: Shallow broad-based central protrusion with calcification of the annulus. The central canal and foramina appear open. L2-3: Mild disc bulge and endplate spur without central canal or foraminal stenosis. L3-4: There is some ligamentum flavum thickening and a broad-based disc bulge with endplate spur. Mild central canal narrowing is seen. There is also some bilateral foraminal narrowing. L4-5: Disc bulge, endplate spur and facet arthropathy are seen. There is mild to moderate central  canal narrowing. Marked bilateral foraminal narrowing is also seen. L5-S1: There is facet degenerative change and a shallow broad-based disc bulge. The central canal and right foramen are open. Mild to moderate left foraminal narrowing is identified. IMPRESSION: Findings highly suspicious for a nondisplaced left sacral fracture. Negative for lumbar spine fracture. Multilevel degenerative disc disease as detailed above. Electronically Signed   By: Inge Rise M.D.   On: 10/07/2015  16:24   Ir Ivc Filter Plmt / S&i /img Guid/mod Sed  10/11/2015  INDICATION: 82 year old female on anticoagulation for DVT. Sustained a right rectus sheath hematoma requiring cessation of anticoagulation. This is an indication for IVC filter placement for PE prophylaxis. EXAM: ULTRASOUND GUIDANCE FOR VASCULARACCESS IVC CATHETERIZATION AND VENOGRAM IVC FILTER INSERTION Interventional Radiologist:  Criselda Peaches, MD MEDICATIONS: None. ANESTHESIA/SEDATION: Fentanyl 50 mcg IV; Versed 1 mg IV Moderate Sedation Time:  10 minutes The patient was continuously monitored during the procedure by the interventional radiology nurse under my direct supervision. FLUOROSCOPY TIME:  Fluoroscopy Time: 0 minutes 24 seconds (45 mGy). COMPLICATIONS: None immediate. Estimated blood loss: None PROCEDURE: Informed written consent was obtained from the patient after a thorough discussion of the procedural risks, benefits and alternatives. All questions were addressed. Maximal Sterile Barrier Technique was utilized including caps, mask, sterile gowns, sterile gloves, sterile drape, hand hygiene and skin antiseptic. A timeout was performed prior to the initiation of the procedure. Maximal barrier sterile technique utilized including caps, mask, sterile gowns, sterile gloves, large sterile drape, hand hygiene, and Betadine prep. Under sterile condition and local anesthesia, right internal jugular venous access was performed with ultrasound. An ultrasound image was saved and sent to PACS. Over a guidewire, the IVC filter delivery sheath and inner dilator were advanced into the IVC just above the IVC bifurcation. Contrast injection was performed for an IVC venogram using carbon dioxide gas. Through the delivery sheath, a retrievable Denali IVC filter was deployed below the level of the renal veins and above the IVC bifurcation. Limited post deployment venacavagram was performed. The delivery sheath was removed and hemostasis was obtained  with manual compression. A dressing was placed. The patient tolerated the procedure well without immediate post procedural complication. FINDINGS: The IVC is patent. No evidence of thrombus, stenosis, or occlusion. No variant venous anatomy. Successful placement of the IVC filter below the level of the renal veins. IMPRESSION: Successful ultrasound and fluoroscopically guided placement of an infrarenal retrievable IVC filter via right jugular approach. Given patient age and limited ability to ambulate this IVC filter should be considered a permanent device. No dedicated temple we made at follow-up for filter retrieval. However, if the patient's clinical status were to change in the future the filter is potentially retrievable. Signed, Criselda Peaches, MD [facility] Electronically Signed   By: Jacqulynn Cadet M.D.   On: 10/11/2015 11:48   Dg Abd 2 Views  10/09/2015  CLINICAL DATA:  Abdominal pain.  Nausea and diarrhea for 1 day. EXAM: ABDOMEN - 2 VIEW COMPARISON:  CT of 01/23/2015 FINDINGS: Upright and right-sided decubitus views. Dual lead pacer. No free intraperitoneal air. No significant air fluid levels. Bilateral hip arthroplasty. No bowel distension. Cholecystectomy. No abnormal abdominal calcifications. No appendicolith. Vascular calcifications. Cardiomegaly. Right hemidiaphragm elevation. IMPRESSION: No acute findings. Electronically Signed   By: Abigail Miyamoto M.D.   On: 10/09/2015 18:13    Lab Data:  CBC:  Recent Labs Lab 10/06/15 2116 10/08/15 [id number] 10/09/15 [id number] 10/10/15 0555 10/10/15 [id number] 10/10/15  1433 10/11/15 0035 10/11/15 0807  WBC 10.5 11.0* 17.2* 19.5*  --   --   --   --   NEUTROABS 8.3*  --   --   --   --   --   --   --   HGB 11.3* 10.4* 9.7* 7.8* 7.8* 8.0* 8.8* 8.3*  HCT 35.5* 32.9* 30.5* 23.2* 24.7* 24.8* 26.8* 24.9*  MCV 110.6* 111.9* 112.5* 108.9*  --   --   --   --   PLT 190 146* 148* 162  --   --   --   --     Basic Metabolic Panel:  Recent Labs Lab 10/06/15 2116 10/07/15 1435 10/08/15 0512 10/09/15 0546 10/10/15 0555  NA 140 141 142 142 139  K 4.5 4.3 3.6 3.3* 3.4*  CL 102 105 105 105 101  CO2 29 28 30 27 26   GLUCOSE 85 150* 95 88 100*  BUN 31* 28* 22* 21* 27*  CREATININE 1.50* 1.44* 1.30* 1.36* 1.66*  CALCIUM 9.0 8.7* 8.6* 8.4* 8.3*  MG  --  1.9  --   --   --    GFR: Estimated Creatinine Clearance: 21.2 mL/min (by C-G formula based on Cr of 1.66). Liver Function Tests:  Recent Labs Lab 10/06/15 2116 10/07/15 1435  AST 27 18  ALT 28 24  ALKPHOS 181* 169*  BILITOT 0.9 1.0  PROT 6.2* 5.6*  ALBUMIN 2.8* 2.5*   No results for input(s): LIPASE, AMYLASE in the last 168 hours. No results for input(s): AMMONIA in the last 168 hours. Coagulation Profile:  Recent Labs Lab 10/08/15 1154  INR 1.27   Cardiac Enzymes: No results for input(s): CKTOTAL, CKMB, CKMBINDEX, TROPONINI in the last 168 hours. BNP (last 3 results)  Recent Labs  03/18/15 1643  PROBNP 288.0*   HbA1C: No results for input(s): HGBA1C in the last 72 hours. CBG: No results for input(s): GLUCAP in the last 168 hours. Lipid Profile: No results for input(s): CHOL, HDL, LDLCALC, TRIG, CHOLHDL, LDLDIRECT in the last 72 hours. Thyroid Function Tests: No results for input(s): TSH, T4TOTAL, FREET4, T3FREE, THYROIDAB in the last 72 hours. Anemia Panel:  Recent Labs  10/10/15 0923 10/10/15 0937  VITAMINB12 1135*  --   FOLATE  --  57.3   Urine analysis:    Component Value Date/Time   COLORURINE YELLOW 03/18/2015 [place] 03/18/2015 1643   LABSPEC 1.010 03/18/2015 1643   PHURINE 6.0 03/18/2015 1643   GLUCOSEU NEGATIVE 03/18/2015 1643   GLUCOSEU NEGATIVE 01/23/2015 1606   HGBUR NEGATIVE 03/18/2015 1643   HGBUR large 01/26/2010 1104   BILIRUBINUR NEGATIVE 03/18/2015 [place] 03/18/2015 1643   PROTEINUR NEGATIVE 01/23/2015 1606   UROBILINOGEN 0.2 03/18/2015 1643    NITRITE NEGATIVE 03/18/2015 [place] 03/18/2015 [place] M.D. [facility] 10/11/2015, 1:01 PM  Pager: [phone] Between 7am to 7pm - call Pager - [phone]  After 7pm go to [email or web address] - password [email or web address]  Call night coverage person covering after 7pm

## 2015-10-12 ENCOUNTER — Encounter (HOSPITAL_BASED_OUTPATIENT_CLINIC_OR_DEPARTMENT_OTHER): Payer: Medicare Other

## 2015-10-12 ENCOUNTER — Inpatient Hospital Stay (HOSPITAL_COMMUNITY): Payer: Medicare Other

## 2015-10-12 DIAGNOSIS — I10 Essential (primary) hypertension: Secondary | ICD-10-CM

## 2015-10-12 DIAGNOSIS — N183 Chronic kidney disease, stage 3 (moderate): Secondary | ICD-10-CM

## 2015-10-12 DIAGNOSIS — S301XXS Contusion of abdominal wall, sequela: Secondary | ICD-10-CM

## 2015-10-12 DIAGNOSIS — I481 Persistent atrial fibrillation: Secondary | ICD-10-CM

## 2015-10-12 DIAGNOSIS — I5042 Chronic combined systolic (congestive) and diastolic (congestive) heart failure: Secondary | ICD-10-CM

## 2015-10-12 DIAGNOSIS — Z7189 Other specified counseling: Secondary | ICD-10-CM

## 2015-10-12 LAB — BASIC METABOLIC PANEL
ANION GAP: 8 (ref 5–15)
BUN: 26 mg/dL — AB (ref 6–20)
CHLORIDE: 103 mmol/L (ref 101–111)
CO2: 25 mmol/L (ref 22–32)
Calcium: 8.3 mg/dL — ABNORMAL LOW (ref 8.9–10.3)
Creatinine, Ser: 1.24 mg/dL — ABNORMAL HIGH (ref 0.44–1.00)
GFR, EST AFRICAN AMERICAN: 43 mL/min — AB (ref >60)
GFR, EST NON AFRICAN AMERICAN: 37 mL/min — AB (ref >60)
Glucose, Bld: 86 mg/dL (ref 65–99)
POTASSIUM: 3.3 mmol/L — AB (ref 3.5–5.1)
SODIUM: 136 mmol/L (ref 135–145)

## 2015-10-12 LAB — CBC WITH DIFFERENTIAL/PLATELET
Basophils Absolute: 0.1 10*3/uL (ref 0.0–0.1)
Basophils Relative: 1 %
EOS ABS: 0.2 10*3/uL (ref 0.0–0.7)
Eosinophils Relative: 2 %
HCT: 24.8 % — ABNORMAL LOW (ref 36.0–46.0)
Hemoglobin: 8.2 g/dL — ABNORMAL LOW (ref 12.0–15.0)
LYMPHS ABS: 1.1 10*3/uL (ref 0.7–4.0)
LYMPHS PCT: 9 %
MCH: 33.3 pg (ref 26.0–34.0)
MCHC: 33.1 g/dL (ref 30.0–36.0)
MCV: 100.8 fL — AB (ref 78.0–100.0)
MONO ABS: 1.2 10*3/uL — AB (ref 0.1–1.0)
MONOS PCT: 10 %
NEUTROS ABS: 9.2 10*3/uL — AB (ref 1.7–7.7)
NEUTROS PCT: 78 %
PLATELETS: 149 10*3/uL — AB (ref 150–400)
RBC: 2.46 MIL/uL — ABNORMAL LOW (ref 3.87–5.11)
RDW: 20.9 % — ABNORMAL HIGH (ref 11.5–15.5)
WBC MORPHOLOGY: INCREASED
WBC: 11.8 10*3/uL — AB (ref 4.0–10.5)

## 2015-10-12 LAB — PROCALCITONIN: PROCALCITONIN: 0.16 ng/mL

## 2015-10-12 MED ORDER — FENTANYL CITRATE (PF) 100 MCG/2ML IJ SOLN
INTRAMUSCULAR | Status: AC | PRN
  Administered 2015-10-12: 25 ug via INTRAVENOUS

## 2015-10-12 MED ORDER — GELATIN ABSORBABLE 12-7 MM EX MISC
CUTANEOUS | Status: AC
  Administered 2015-10-12: 14:00:00
  Filled 2015-10-12: qty 1

## 2015-10-12 MED ORDER — SODIUM CHLORIDE 0.9 % IV SOLN
INTRAVENOUS | Status: DC
  Administered 2015-10-12: 19:00:00 via INTRAVENOUS

## 2015-10-12 MED ORDER — FENTANYL CITRATE (PF) 100 MCG/2ML IJ SOLN
INTRAMUSCULAR | Status: AC
  Filled 2015-10-12: qty 2

## 2015-10-12 MED ORDER — FENTANYL 50 MCG/HR TD PT72
75.0000 ug | MEDICATED_PATCH | TRANSDERMAL | Status: DC
  Administered 2015-10-12: 75 ug via TRANSDERMAL
  Filled 2015-10-12 (×2): qty 1

## 2015-10-12 MED ORDER — BUPIVACAINE HCL (PF) 0.25 % IJ SOLN
INTRAMUSCULAR | Status: AC
  Administered 2015-10-12: 30 mL
  Filled 2015-10-12: qty 30

## 2015-10-12 MED ORDER — CEFAZOLIN SODIUM-DEXTROSE 2-3 GM-% IV SOLR
INTRAVENOUS | Status: AC
  Filled 2015-10-12: qty 50

## 2015-10-12 MED ORDER — TOBRAMYCIN SULFATE 1.2 G IJ SOLR
INTRAMUSCULAR | Status: AC
Start: 2015-10-12 — End: 2015-10-12
  Administered 2015-10-12: 1.2 g
  Filled 2015-10-12: qty 1.2

## 2015-10-12 MED ORDER — IOPAMIDOL (ISOVUE-300) INJECTION 61%
INTRAVENOUS | Status: AC
  Administered 2015-10-12: 5 mL
  Filled 2015-10-12: qty 50

## 2015-10-12 MED ORDER — FLUMAZENIL 0.5 MG/5ML IV SOLN
INTRAVENOUS | Status: AC
  Filled 2015-10-12: qty 5

## 2015-10-12 MED ORDER — POTASSIUM CHLORIDE CRYS ER 20 MEQ PO TBCR
40.0000 meq | EXTENDED_RELEASE_TABLET | Freq: Once | ORAL | Status: AC
  Administered 2015-10-12: 40 meq via ORAL
  Filled 2015-10-12: qty 2

## 2015-10-12 MED ORDER — MIDAZOLAM HCL 2 MG/2ML IJ SOLN
INTRAMUSCULAR | Status: AC | PRN
  Administered 2015-10-12: 1 mg via INTRAVENOUS

## 2015-10-12 MED ORDER — NALOXONE HCL 0.4 MG/ML IJ SOLN
INTRAMUSCULAR | Status: AC
  Filled 2015-10-12: qty 1

## 2015-10-12 NOTE — Sedation Documentation (Signed)
Patient is resting comfortably. 

## 2015-10-12 NOTE — Sedation Documentation (Addendum)
Waiting on stat CBC w/Diff results per Dr. Estanislado Pandy before proceeding. Family at bedside and pt comfortable. Was given pain pill [id number]

## 2015-10-12 NOTE — Consult Note (Addendum)
CARDIOLOGY CONSULT NOTE   Patient ID: Ladaijah Braggs MRN: [id number] DOB/AGE: [birth year] 82 y.o.  Admit date: 10/06/2015  Primary Physician   [name], MD Primary Cardiologist   Thompson Grayer, MD  Reason for Consultation  Anticoagulation Requesting Physician  Dr. Tana Coast  HPI: Nicole Roberts is a 82 y.o. female from [facility] with a history of Chronic Afib on renal dose of eliquis, PPM for symptomatic bradycardia, HTN, moderate MR, chronic diastolic dysfunction, RA, breast cancer s/p lumpectomy, HLD, TIA who presented with leg pain.   Last echo 04/2015 showed improved LV function to 55- 60%, unable to assess diastolic dysfunction, trivial MR, moderately dilated LA, PA pressure of 50 mm Hg, elevated central pressure. She was doing well when seen by last by Dr. Rayann Heman 05/06/15. Last device check 09/24/15 was normal. Estimated longevity <0.5 years.  The patient fell and developed left leg skin tear about 3-4 weeks ago. Due to redness she was started on keflex however due to worsening of symptoms she was admitted to hospital 10/07/15 with cellulitis --> treated with IV abx.  Due to back pain CT of lumbar spine done which showed sacral fracture. Doppler showed RLE DVT--> renal dose of eliquis was hold  and started on IV heparin. Unfortunately she developed a rectus sheath hematoma while on heparin drip (for DVT) and discontinued 6/10. Transfused 1 unit. The patient had IVC placement yesterday. Pending CBC today. The patient just came from sacroplasty. Now cardiology consulted to anticoagualtion management given chronic kidney disease.   EKG on admission showed afib at rate of 91 bpm. The patient is mostly wheel chair bound. No chest pain, palpitations, syncope per sob at bedside who is power of attorney. Hx obtained from reviewing chart and son.   Past Medical History  Diagnosis Date  . HEARING LOSS   . MITRAL VALVE INSUFF&AORTIC VALVE INSUFF     moderate MR  .  PULMONARY HYPERTENSION   . Atrial fibrillation ([place])     permanent  . SICK SINUS SYNDROME     a. Tachybrady syndrome - Guidant PPM 10/2005.  . Edema 03/19/2009    due to venous insufficiency, chronic lymphedema  . URINARY INCONTINENCE   . Rheumatoid arthritis(714.0) dx clarified 2011    On prednisone  . BREAST CANCER 12/2006    ductal ca s/p L lumpectomy  . Diastolic dysfunction   . HYPERTENSION   . HYPERLIPIDEMIA   . GERD   . HYPOTHYROIDISM   . Hyperparathyroidism     2 lobes removed  . Venous insufficiency     chronic BLE edema  . Spinal stenosis   . Anemia     a. Macrocytic - normal B12, folate 08/2011. b. 3/6 heme positive stools 10/2011 - per PCP note, elected against colonscopy.  [name] TIA (transient ischemic attack) 05/2011  . Anxiety      Past Surgical History  Procedure Laterality Date  . Pacemaker placement  2005    SSS and syncope  . Cholecystectomy  04/06/99  . Abdominal hysterectomy  1970    Partial  . Left hip arthroscopy  1995  . Right hip arthroscopy  01/2001  . Carpal tunnel release  1998    bilateral  . Breast surgery  12/2006    Left breast lupectomy- Ductal CA  . Left parathyroidectomy  07/2008  . Esophagogastroduodenoscopy N/A 10/18/2012    Procedure: ESOPHAGOGASTRODUODENOSCOPY (EGD);  Surgeon: Ladene Artist, MD;  Location: [facility];  Service: Endoscopy;  Laterality: N/A;  .  Esophagogastroduodenoscopy (egd) with propofol N/A 01/29/2015    Procedure: ESOPHAGOGASTRODUODENOSCOPY (EGD) WITH PROPOFOL;  Surgeon: Inda Castle, MD;  Location: [facility];  Service: Endoscopy;  Laterality: N/A;  . Ercp N/A 01/29/2015    Procedure: ENDOSCOPIC RETROGRADE CHOLANGIOPANCREATOGRAPHY (ERCP);  Surgeon: Inda Castle, MD;  Location: [facility];  Service: Endoscopy;  Laterality: N/A;    Allergies  Allergen Reactions  . Sulfa Antibiotics Itching  . Tramadol Hcl Itching and Rash    I have reviewed the patient's current medications . amoxicillin-clavulanate  1  tablet Oral Q12H  . calcium-vitamin D  1 tablet Oral BID  . cholecalciferol  1,000 Units Oral Daily  . citalopram  20 mg Oral Daily  . conjugated estrogens  1 Applicatorful Vaginal Once per day on Mon Wed Fri  . fentaNYL  75 mcg Transdermal Q72H  . folic acid  1 mg Oral q morning - 10a  . gabapentin  800 mg Oral QHS  . isosorbide mononitrate  15 mg Oral Daily  . magnesium oxide  400 mg Oral Daily  . metoprolol succinate  50 mg Oral BID  . multivitamin  1 tablet Oral Daily  . nystatin  5 mL Oral QID  . pantoprazole  40 mg Oral BID  . potassium chloride SA  20 mEq Oral Daily  . predniSONE  5 mg Oral Q breakfast  . sodium chloride flush  3 mL Intravenous Q12H  . sucralfate  1 g Oral BID  . vitamin B-12  500 mcg Oral Daily     sodium chloride, HYDROcodone-acetaminophen, loperamide, nitroGLYCERIN, ondansetron (ZOFRAN) IV, sodium chloride flush  Prior to Admission medications   Medication Sig Start Date End Date Taking? Authorizing Provider  apixaban (ELIQUIS) 2.5 MG TABS tablet Take 1 tablet (2.5 mg total) by mouth 2 (two) times daily. 02/04/15  Yes Charlynne Cousins, MD  calcium citrate-vitamin D (CITRACAL+D) 315-200 MG-UNIT tablet Take 1 tablet by mouth 2 (two) times daily. Patient taking differently: Take 1 tablet by mouth 2 (two) times daily. Gummies 08/24/15  Yes [name], MD  cephALEXin (KEFLEX) 500 MG capsule Take 1 capsule (500 mg total) by mouth 2 (two) times daily. 10/05/15  Yes Lucretia Kern, DO  cholecalciferol (VITAMIN D) 1000 UNITS tablet Take 1,000 Units by mouth daily.   Yes Historical Provider, MD  citalopram (CELEXA) 20 MG tablet Take 20 mg by mouth daily.   Yes Historical Provider, MD  conjugated estrogens (PREMARIN) vaginal cream Place 1 Applicatorful vaginally 3 (three) times a week.   Yes Historical Provider, MD  fentaNYL (DURAGESIC - DOSED MCG/HR) 75 MCG/HR Place 1 patch (75 mcg total) onto the skin every 3 (three) days. 09/14/15  Yes [name], MD  folic acid  (FOLVITE) A999333 MCG tablet Take 400 mcg by mouth every morning.   Yes Historical Provider, MD  furosemide (LASIX) 20 MG tablet Take 2 tablets (40 mg total) by mouth daily. Until directed to decrease back to 20 mg daily 03/18/15  Yes [name], MD  gabapentin (NEURONTIN) 400 MG capsule Take 2 capsules (800 mg total) by mouth at bedtime. 12/02/14  Yes Rowe Clack, MD  HYDROcodone-acetaminophen (NORCO/VICODIN) 5-325 MG tablet Take 1 tablet by mouth every 6 (six) hours as needed for moderate pain.   Yes Historical Provider, MD  isosorbide mononitrate (IMDUR) 30 MG 24 hr tablet Take 15 mg by mouth daily.   Yes Historical Provider, MD  levothyroxine (SYNTHROID, LEVOTHROID) 50 MCG tablet Take 1 tablet (50 mcg  total) by mouth daily. Patient taking differently: Take 50 mcg by mouth daily before breakfast.  06/09/14  Yes Rowe Clack, MD  magnesium oxide (MAG-OX) 400 MG tablet Take 400 mg by mouth daily.   Yes Historical Provider, MD  metoprolol succinate (TOPROL-XL) 50 MG 24 hr tablet Take 1 tablet (50 mg total) by mouth 2 (two) times daily. Take with or immediately following a meal. 06/02/14  Yes Rowe Clack, MD  Multiple Vitamin (DAILY-VITE) TABS TAKE ONE TABLET BY MOUTH ONCE DAILY. 07/17/15  Yes [name], MD  nitroGLYCERIN (NITROSTAT) 0.4 MG SL tablet Place 0.4 mg under the tongue every 5 (five) minutes x 3 doses as needed for chest pain. 03/20/12  Yes Roger A Arguello, PA-C  nystatin (MYCOSTATIN) 100000 UNIT/ML suspension Take 5 mLs (500,000 Units total) by mouth 4 (four) times daily. Use for two days after symptoms have resolved and then stop 09/21/15  Yes [name], MD  pantoprazole (PROTONIX) 40 MG tablet Take 1 tablet (40 mg total) by mouth 2 (two) times daily. 08/24/15  Yes [name], MD  potassium chloride SA (K-DUR,KLOR-CON) 20 MEQ tablet TAKE ONE TABLET BY MOUTH ONCE DAILY. 06/26/15  Yes [name], MD  predniSONE (DELTASONE) 5 MG tablet Take 5 mg by mouth daily with  breakfast.   Yes Historical Provider, MD  sucralfate (CARAFATE) 1 GM/10ML suspension Take 1 g by mouth 2 (two) times daily.   Yes Historical Provider, MD  vitamin B-12 (CYANOCOBALAMIN) 500 MCG tablet TAKE ONE TABLET BY MOUTH ONCE DAILY. 11/20/13  Yes Rowe Clack, MD     Social History   Social History  . Marital Status: Married    Spouse Name: N/A  . Number of Children: 6  . Years of Education: N/A   Occupational History  . Retired Agricultural engineer    Social History Main Topics  . Smoking status: Never Smoker   . Smokeless tobacco: Never Used  . Alcohol Use: No  . Drug Use: No  . Sexual Activity: Not Currently   Other Topics Concern  . Not on file   Social History Narrative   Lives at [facility] since 03/2009- married, lives with spouse. Moved here from [facility] to be near son    Family Status  Relation Status Death Age  . Mother Deceased   . Father Deceased   . Maternal Grandmother Deceased   . Maternal Grandfather Deceased   . Paternal Grandmother Deceased   . Paternal Grandfather Deceased    Family History  Problem Relation Age of Onset  . Ovarian cancer Mother   . Coronary artery disease Sister   . Coronary artery disease Brother   . Hypertension Mother     grandparent  . Lung cancer Father       ROS:  Full 14 point review of systems complete and found to be negative unless listed above.  Physical Exam: Blood pressure 118/59, pulse 76, temperature 98.3 F (36.8 C), temperature source Oral, resp. rate 16, height 5\' 4"  (1.626 m), weight 153 lb 6.4 oz (69.582 kg), SpO2 96 %.  General: drowsy and sleepy after OR, female in no acute distress Head: Eyes PERRLA, No xanthomas. Normocephalic and atraumatic, oropharynx without edema or exudate.  Lungs: Resp regular and unlabored, CTA anteriorly. Heart: Ir IR  no s3, s4, or murmurs..   Neck: No carotid bruits. No lymphadenopathy.  JVD. Abdomen: Bowel sounds present, abdomen soft and non-tender without  masses or hernias noted. Msk:  No spine or cva tenderness. No weakness, no joint deformities or effusions. Extremities: No clubbing, cyanosis or edema. DP/PT/Radials 2+ and equal bilaterally. Neuro: Alert and oriented X 3. No focal deficits noted. Psych:  Good affect, responds appropriately Skin: No rashes or lesions noted.  Labs:   Lab Results  Component Value Date   WBC 19.5* 10/10/2015   HGB 8.3* 10/11/2015   HCT 24.9* 10/11/2015   MCV 108.9* 10/10/2015   PLT 162 10/10/2015   No results for input(s): INR in the last 72 hours.  Recent Labs Lab 10/07/15 1435  10/12/15 0545  NA 141  < > 136  K 4.3  < > 3.3*  CL 105  < > 103  CO2 28  < > 25  BUN 28*  < > 26*  CREATININE 1.44*  < > 1.24*  CALCIUM 8.7*  < > 8.3*  PROT 5.6*  --   --   BILITOT 1.0  --   --   ALKPHOS 169*  --   --   ALT 24  --   --   AST 18  --   --   GLUCOSE 150*  < > 86  ALBUMIN 2.5*  --   --   < > = values in this interval not displayed. MAGNESIUM  Date Value Ref Range Status  10/07/2015 1.9 1.7 - 2.4 mg/dL Final   No results for input(s): CKTOTAL, CKMB, TROPONINI in the last 72 hours. No results for input(s): TROPIPOC in the last 72 hours. PRO B NATRIURETIC PEPTIDE (BNP)  Date/Time Value Ref Range Status  03/18/2015 04:43 PM 288.0* 0.0 - 100.0 pg/mL Final  12/13/2012 06:00 AM 7685.0* 0 - 450 pg/mL Final   Lab Results  Component Value Date   CHOL 139 06/02/2014   HDL 70.50 06/02/2014   LDLCALC 34 06/02/2014   TRIG 171.0* 06/02/2014   Lab Results  Component Value Date   DDIMER  11/22/2009    <0.22        AT THE INHOUSE ESTABLISHED CUTOFF VALUE OF 0.48 ug/mL FEU, THIS ASSAY HAS BEEN DOCUMENTED IN THE LITERATURE TO HAVE A SENSITIVITY AND NEGATIVE PREDICTIVE VALUE OF AT LEAST 98 TO 99%.  THE TEST RESULT SHOULD BE CORRELATED WITH AN ASSESSMENT OF THE CLINICAL PROBABILITY OF DVT / VTE.   LIPASE  Date/Time Value Ref Range Status  01/30/2015 06:00 AM 20* 22 - 51 U/L Final   TSH    Date/Time Value Ref Range Status  06/08/2015 02:18 PM 1.44 0.35 - 4.50 uIU/mL Final   VITAMIN B-12  Date/Time Value Ref Range Status  10/10/2015 09:23 AM 1135* 180 - 914 pg/mL Final    Comment:    (NOTE) This assay is not validated for testing neonatal or myeloproliferative syndrome specimens for Vitamin B12 levels.    FOLATE  Date/Time Value Ref Range Status  10/10/2015 09:37 AM 57.3 >5.9 ng/mL Final    Comment:    RESULTS CONFIRMED BY MANUAL DILUTION   FERRITIN  Date/Time Value Ref Range Status  12/14/2012 05:40 AM 1721* 10 - 291 ng/mL Final    Comment:    Result confirmed by automatic dilution. Performed at [facility]   TIBC  Date/Time Value Ref Range Status  12/14/2012 05:40 AM 161* 250 - 470 ug/dL Final   IRON  Date/Time Value Ref Range Status  12/14/2012 05:40 AM 13* 42 - 135 ug/dL Final    Echo: 04/02/2015 LV EF: 55% - 60%  ------------------------------------------------------------------- Indications: Cardiomyopathy (I42.9).  ------------------------------------------------------------------- History: PMH:  Edema.  ------------------------------------------------------------------- Study Conclusions  - Left ventricle: The cavity size was normal. Wall thickness was  increased in a pattern of mild LVH. Systolic function was normal.  The estimated ejection fraction was in the range of 55% to 60%.  The study is not technically sufficient to allow evaluation of LV  diastolic function. - Aortic valve: Trileaflet. Sclerosis without stenosis. There was  no regurgitation. - Mitral valve: Calcified annulus. Mildly thickened leaflets .  There was trivial regurgitation. - Left atrium: Moderately dilated at 39 ml/m2. - Right ventricle: The cavity size was normal. Pacer wire or  catheter noted in right ventricle. Systolic function is reduced. - Right atrium: The atrium was mildly dilated. Pacer wire or  catheter noted in right  atrium. - Tricuspid valve: There was moderate regurgitation. - Pulmonary arteries: PA peak pressure: 50 mm Hg (S). - Inferior vena cava: The vessel was normal in size. The  respirophasic diameter changes were in the normal range (= 50%),  consistent with normal central venous pressure.  Impressions:  - Compared to a prior echo in 2014, the EF has now normalized.  ECG:  EKG on admission showed afib at rate of 91 bpm.   Radiology:  Ct Abdomen Pelvis Wo Contrast  10/10/2015  CLINICAL DATA:  82 year old female with a history of fall and right-sided abdominal pain. EXAM: CT ABDOMEN AND PELVIS WITHOUT CONTRAST TECHNIQUE: Multidetector CT imaging of the abdomen and pelvis was performed following the standard protocol without IV contrast. COMPARISON:  Prior CT 01/23/2015 FINDINGS: Lower chest: Unremarkable appearance of the soft tissues of the chest wall. Heart size within normal limits.  No pericardial fluid/thickening. Pacing leads partially visualized. Calcifications of aortic valve and coronary calcifications. No lower mediastinal adenopathy. Fluid filled thoracic esophagus.  Small hiatal hernia. No confluent airspace disease, pleural fluid, or pneumothorax within visualized lung. Atelectasis/scarring at the lung bases. Abdomen/pelvis: Unremarkable appearance of liver and spleen. Unremarkable appearance of bilateral adrenal glands. No peripancreatic or pericholecystic fluid or inflammatory changes. Cholecystectomy. No intrahepatic or extrahepatic biliary ductal dilatation. Punctate gas within left-sided biliary ducts. No intra-peritoneal free air or significant free-fluid. No abnormally dilated small bowel or colon. No transition point. No inflammatory changes of the mesenteries. Enteric contrast reaches the distal colon. Colonic diverticula without evidence of associated inflammatory changes. Normal appendix. Sagittal images demonstrate rectocele. Right Kidney/Ureter: No hydronephrosis. No  nephrolithiasis. No perinephric stranding. Unremarkable course of the right ureter. Low-density cystic structure of the lateral right cortex, compatible with benign cyst. Left Kidney/Ureter: No hydronephrosis. No nephrolithiasis. No perinephric stranding. Unremarkable course of the left ureter. Low-density cystic structure of the inferior left cortex, compatible with benign cyst. Unremarkable appearance of the urinary bladder. Hyperdense hematoma within the right abdominal wall musculature, with the component in the rectus musculature measuring 5.3 cm x 6.7 cm. Second more superior component within the oblique musculature on the right measuring 4.0 cm by 9.9 cm. Scattered vascular calcifications of the abdominal aorta. Calcifications of the mesenteric vasculature. Musculoskeletal: Surgical changes of prior bilateral hip arthroplasty. Multilevel degenerative changes of the visualized spine. Acute angulation of the sacrum at the third sacral element. Diffuse osteopenia of the lumbar vertebral levels. Vertebral body heights maintained. IMPRESSION: Acute extraperitoneal hemorrhage involving the right abdominal wall musculature with component of the rectus muscle measuring 6.7 cm and component within the oblique musculature measuring as great as 9.9 cm. Acute angulation at the third sacral element, compatible with fracture. Pelvic floor insufficiency with rectocele. Atherosclerosis and coronary artery disease. These results  were called by telephone at the time of interpretation on 10/10/2015 at 1:09 pm to the nurse caring for the patient, Ms Alphonsus Sias who verbally acknowledged these results. Signed, Dulcy Fanny. Earleen Newport, DO [facility] Radiology Electronically Signed   By: Corrie Mckusick D.O.   On: 10/10/2015 13:11   Ir Ivc Filter Plmt / S&i /img Guid/mod Sed  10/11/2015  INDICATION: 82 year old female on anticoagulation for DVT. Sustained a right rectus sheath hematoma  requiring cessation of anticoagulation. This is an indication for IVC filter placement for PE prophylaxis. EXAM: ULTRASOUND GUIDANCE FOR VASCULARACCESS IVC CATHETERIZATION AND VENOGRAM IVC FILTER INSERTION Interventional Radiologist:  Criselda Peaches, MD MEDICATIONS: None. ANESTHESIA/SEDATION: Fentanyl 50 mcg IV; Versed 1 mg IV Moderate Sedation Time:  10 minutes The patient was continuously monitored during the procedure by the interventional radiology nurse under my direct supervision. FLUOROSCOPY TIME:  Fluoroscopy Time: 0 minutes 24 seconds (45 mGy). COMPLICATIONS: None immediate. Estimated blood loss: None PROCEDURE: Informed written consent was obtained from the patient after a thorough discussion of the procedural risks, benefits and alternatives. All questions were addressed. Maximal Sterile Barrier Technique was utilized including caps, mask, sterile gowns, sterile gloves, sterile drape, hand hygiene and skin antiseptic. A timeout was performed prior to the initiation of the procedure. Maximal barrier sterile technique utilized including caps, mask, sterile gowns, sterile gloves, large sterile drape, hand hygiene, and Betadine prep. Under sterile condition and local anesthesia, right internal jugular venous access was performed with ultrasound. An ultrasound image was saved and sent to PACS. Over a guidewire, the IVC filter delivery sheath and inner dilator were advanced into the IVC just above the IVC bifurcation. Contrast injection was performed for an IVC venogram using carbon dioxide gas. Through the delivery sheath, a retrievable Denali IVC filter was deployed below the level of the renal veins and above the IVC bifurcation. Limited post deployment venacavagram was performed. The delivery sheath was removed and hemostasis was obtained with manual compression. A dressing was placed. The patient tolerated the procedure well without immediate post procedural complication. FINDINGS: The IVC is patent.  No evidence of thrombus, stenosis, or occlusion. No variant venous anatomy. Successful placement of the IVC filter below the level of the renal veins. IMPRESSION: Successful ultrasound and fluoroscopically guided placement of an infrarenal retrievable IVC filter via right jugular approach. Given patient age and limited ability to ambulate this IVC filter should be considered a permanent device. No dedicated temple we made at follow-up for filter retrieval. However, if the patient's clinical status were to change in the future the filter is potentially retrievable. Signed, Criselda Peaches, MD [facility] Radiology Electronically Signed   By: Jacqulynn Cadet M.D.   On: 10/11/2015 11:48    ASSESSMENT AND PLAN:     1. Chronic atrial fibrillation - CHADSVASCs score of at least 5. Her daily renal dose of Eliquis was held for sacroplasty. Unfortunately she developed a rectus sheath hematoma while on heparin drip (for DVT) and discontinued 10/10/15.   Needs surgeons input when ok to resume anticoagulation due to hematoma. Son at bedside want to "glue the muscle". Per progress note by Dr. Tana Coast 10/12/15 " on call surgeon recommended conservative management". MD to see.   2. DVT  RLE - s/p IVC placement. Anticoagulation as above.   3. CKD, stage III - Scr at baseline  4. Hx PPM for symptomatic brady  5. HTN - Stable and well controlled  6. Rectus  sheath abdominal hematoma - conservative management per note  7. Sacral fracture -s/p sacroplasty today  Signed: Bhagat,Bhavinkumar, PA 10/12/2015, 12:15 PM Pager [phone]  Co-Sign MD  The patient was seen, examined and discussed with Bhagat,Bhavinkumar PA-C and I agree with the above.   This is a 82 year old female with prior medical history of chronic persistent atrial fibrillation, CK D stage III status post pacemaker placement for symptomatic bradycardia and hypertension who was admitted for a  left leg skin tear complicated by cellulitis treated with antibiotics, and while in the hospital she was diagnosed with sacral fracture. She was also diagnosed with right lower extremity DVT status post IVC placement yesterday. She was previously anticoagulated with Eliquis 2.5 mg by mouth twice a day, and developed a rectus sheath hematoma with heparin drop from 10.4-7.8 and received 1 unit of packed red blood cells. Today her hemoglobin was 8.2. We were consulted for continuation of anticoagulation, the patient herself seems to be in moderate distress secondary to pain in her back and in her leg. Her son seems to be very concerned about his mom not being on anticoagulation and insisting on full anticoagulation. I have had the discussion with him about risk and benefits, I personally believe that her risk of bleeding is currently higher than the risk of stroke but agree with the patient that considering her age and sex and comorbidities and her CHADS-VASc is very high, at this point he believes that risk of stroke is higher and he is positive about restarting her anticoagulation. The son is persistently had to contact surgeon for "gluing the bleeding muscle or clipping it. " Considering her creatinine has worsened once it is acceptable by a surgeon to restart anticoagulation I would restart Eliquis is 2.5 mg by mouth twice a day and monitor hemoglobin closely.  Ena Dawley, MD 10/12/2015

## 2015-10-12 NOTE — Sedation Documentation (Signed)
Dsg to lower back intact.

## 2015-10-12 NOTE — Progress Notes (Signed)
[facility]                                                                              Patient Demographics  Nicole Roberts, is a 82 y.o. female, DOB - [birth year], [id number]  Admit date - 10/06/2015   Admitting Physician Nicole Grout, MD  Outpatient Primary MD for the patient is Nicole Rail, MD  Outpatient specialists:   LOS - 6  days    Chief Complaint  Patient presents with  . Leg Pain  . Cellulitis       Brief summary   Patient is a 82 year old from [place] [facility] with history of atrial fibrillation, hypertension and hyperlipidemia, GERD, hypothyroidism, anemia, TIA presented to ED with cellulitis, leg pain. Patient apparently fell and hit her leg over 3 weeks ago and developed a skin tear. About a week prior to admission he started to get more red and erythematous. She was started on Keflex 2 days prior to admission. The redness worsened despite antibiotics over to the anterior leg up to the knee. No fevers. Patient was admitted for cellulitis.   Assessment & Plan    Principal Problem:   Cellulitis- - Outpatient failure to Keflex - cont IV vancomycin and Zosyn- Positive shunt to oral Augmentin - Wound care consulted  Active Problems: Acute on chronic back pain, recent fall, radiating to legs Likely due to sacral fracture - CT of the lumbar spine showed left sacral fracture - IR consulted for kyphoplasty, plan today  Right leg pain  due to right leg DVT - Doppler ultrasound of LE positive for DVT in the right posterior tibial and peroneal vein.  - Patient was on eliquis with renal dosing prior to admission, and, then placed on IV heparin drip now discontinued due to rectus sheath hematoma  - IVC filter placed on 6/11  Rectus sheath abd hematoma - Abdominal x-ray 6/9 was negative for any SBO or perforation - CT abd : Acute extraperitoneal hemorrhage/rectus sheath hematoma 6.7x 9.9cm - Conservative management per  surgery, discussed with on call surgery Dr Nicole Roberts on 6/10, usually resolves spontaneously, will need CT abdomen prior to discharge and outpatient to keep a close monitoring. Follow H&H closely - Continue abdominal binder, heparin drip discontinued on 6/10 - Transfused 1 unit, H&H stable -Patient's son at the bedside wants to surgery for the hematoma "to glue the muscles" so that she can be started on eliquis. I explained to the patient's son that I had discussed with surgery (and had updated him as well) and conservative management was recommended by the surgeon on call. I do not feel comfortable restarting her on anticoagulation given the large hematoma. Patient's son is upset that it puts the patient at stroke risk, given her atrial fibrillation. I explained him that she is also at a high bleeding risk if anticoagulation is restarted now. He wants to talk to Dr. Rayann Roberts. I have consulted cardiology for their recommendations.    Atrial fibrillation (HCC) - Currently rate controlled currently not on any anticoagulation due to abdominal wall hematoma, please see above    Essential hypertension - Currently controlled -  Continue furosemide, Imdur, metoprolol    Rheumatoid arthritis (HCC) - On chronic steroids    Mild acute on chronic Stage III chronic kidney disease - Baseline creatinine 1.3-1.4, currently at baseline    Chronic combined systolic and diastolic CHF (congestive heart failure) (HCC) -Currently compensated   Macrocytic anemia B12, folate within normal limits, check stool occult test   Code Status: dnr  DVT Prophylaxis: apixaban on hold, heparin drip on hold. DVT in right LE   Family Communication: Discussed in detail with the patient, all imaging results, lab results explained to the patient and patient's son at the bedside    Disposition Plan:   Time Spent in minutes   25 minutes  Procedures:  CT lumbar spine CT abdomen Doppler ultrasound of the lower  extremities  Consultants:   Interventional radiology Cardiology  Antimicrobials:   IV vancomycin 6/6-> 6/11  IV Zosyn 6/7 > 6/11   Medications  Scheduled Meds: . amoxicillin-clavulanate  1 tablet Oral Q12H  . calcium-vitamin D  1 tablet Oral BID  . cholecalciferol  1,000 Units Oral Daily  . citalopram  20 mg Oral Daily  . conjugated estrogens  1 Applicatorful Vaginal Once per day on Mon Wed Fri  . fentaNYL  75 mcg Transdermal Q72H  . folic acid  1 mg Oral q morning - 10a  . gabapentin  800 mg Oral QHS  . isosorbide mononitrate  15 mg Oral Daily  . magnesium oxide  400 mg Oral Daily  . metoprolol succinate  50 mg Oral BID  . multivitamin  1 tablet Oral Daily  . nystatin  5 mL Oral QID  . pantoprazole  40 mg Oral BID  . potassium chloride SA  20 mEq Oral Daily  . potassium chloride  40 mEq Oral Once  . predniSONE  5 mg Oral Q breakfast  . sodium chloride flush  3 mL Intravenous Q12H  . sucralfate  1 g Oral BID  . vitamin B-12  500 mcg Oral Daily   Continuous Infusions:   PRN Meds:.sodium chloride, HYDROcodone-acetaminophen, loperamide, nitroGLYCERIN, ondansetron (ZOFRAN) IV, sodium chloride flush   Antibiotics   Anti-infectives    Start     Dose/Rate Route Frequency Ordered Stop   10/11/15 2200  amoxicillin-clavulanate (AUGMENTIN) 250-125 MG per tablet 1 tablet     1 tablet Oral Every 12 hours 10/11/15 1459 10/13/15 0959   10/07/15 2000  vancomycin (VANCOCIN) 500 mg in sodium chloride 0.9 % 100 mL IVPB  Status:  Discontinued     500 mg 100 mL/hr over 60 Minutes Intravenous Every 24 hours 10/07/15 0119 10/11/15 1459   10/07/15 0400  piperacillin-tazobactam (ZOSYN) IVPB 3.375 g  Status:  Discontinued     3.375 g 12.5 mL/hr over 240 Minutes Intravenous Every 8 hours 10/07/15 0119 10/11/15 1459   10/06/15 2045  piperacillin-tazobactam (ZOSYN) IVPB 3.375 g     3.375 g 100 mL/hr over 30 Minutes Intravenous  Once 10/06/15 2039 10/07/15 0018   10/06/15 2045  vancomycin  (VANCOCIN) IVPB 1000 mg/200 mL premix     1,000 mg 200 mL/hr over 60 Minutes Intravenous  Once 10/06/15 2039 10/07/15 0018        Subjective:   Nicole Roberts was seen and examined today. Patient has no complaints. No abdominal pain. Back pain is currently controlled. Son at the bedside upset about holding anticoagulation. No fevers or chills.  Patient denies dizziness, chest pain, shortness of breath. No acute events overnight.    Objective:   Filed  Vitals:   10/11/15 1342 10/11/15 2110 10/12/15 0514 10/12/15 1300  BP: 102/55 111/56 118/59 125/61  Pulse: 89 86 76 68  Temp: 98.9 F (37.2 C) 98 F (36.7 C) 98.3 F (36.8 C)   TempSrc: Oral  Oral   Resp: 18 18 16 20   Height:      Weight:      SpO2: 100% 98% 96% 98%    Intake/Output Summary (Last 24 hours) at 10/12/15 1322 Last data filed at 10/12/15 1027  Gross per 24 hour  Intake     60 ml  Output      0 ml  Net     60 ml     Wt Readings from Last 3 Encounters:  10/11/15 69.582 kg (153 lb 6.4 oz)  08/10/15 70.217 kg (154 lb 12.8 oz)  06/08/15 74.844 kg (165 lb)     Exam  General: Alert and oriented x 3, NAD  HEENT:   Neck:   Cardiovascular: S1 S2 auscultated, no rubs, murmurs or gallops. Regular rate and rhythm.  Respiratory: Clear to auscultation bilaterally, no wheezing, rales or rhonchi  Gastrointestinal: Abdominal binder on  Ext: no cyanosis clubbing or edema  Neuro: no new deficits  Skin: Dressing intact on the lower extremities  Psych: alert and oriented   Data Reviewed:  I have personally reviewed following labs and imaging studies  Micro Results Recent Results (from the past 240 hour(s))  Blood Culture (routine x 2)     Status: None   Collection Time: 10/06/15  9:16 PM  Result Value Ref Range Status   Specimen Description BLOOD RIGHT ARM  Final   Special Requests BOTTLES DRAWN AEROBIC ONLY 10CC  Final   Culture NO GROWTH 5 DAYS  Final   Report Status 10/11/2015 FINAL  Final  Blood  Culture (routine x 2)     Status: Abnormal   Collection Time: 10/06/15  9:38 PM  Result Value Ref Range Status   Specimen Description BLOOD LEFT ANTECUBITAL  Final   Special Requests BOTTLES DRAWN AEROBIC ONLY 5CC  Final   Culture  Setup Time   Final    GRAM POSITIVE COCCI IN CLUSTERS AEROBIC BOTTLE ONLY CRITICAL RESULT CALLED TO, READ BACK BY AND VERIFIED WITH: C STEWART [facility] D [id number] M WILSON    Culture (A)  Final    STAPHYLOCOCCUS SPECIES (COAGULASE NEGATIVE) THE SIGNIFICANCE OF ISOLATING THIS ORGANISM FROM A SINGLE SET OF BLOOD CULTURES WHEN MULTIPLE SETS ARE DRAWN IS UNCERTAIN. PLEASE NOTIFY THE MICROBIOLOGY DEPARTMENT WITHIN ONE WEEK IF SPECIATION AND SENSITIVITIES ARE REQUIRED.    Report Status 10/11/2015 FINAL  Final  Blood Culture ID Panel (Reflexed)     Status: Abnormal   Collection Time: 10/06/15  9:38 PM  Result Value Ref Range Status   Enterococcus species NOT DETECTED NOT DETECTED Final   Vancomycin resistance NOT DETECTED NOT DETECTED Final   Listeria monocytogenes NOT DETECTED NOT DETECTED Final   Staphylococcus species DETECTED (A) NOT DETECTED Corrected    Comment: CRITICAL RESULT CALLED TO, READ BACK BY AND VERIFIED WITH: CLamont Snowball. D. 11:35 10/08/15 (wilsonm) CORRECTED ON 06/09 AT 1034: PREVIOUSLY REPORTED AS DETECTED C. Lamont Snowball. D. 11:35 10/08/15 ([email or web address])    Staphylococcus aureus NOT DETECTED NOT DETECTED Final   Methicillin resistance NOT DETECTED NOT DETECTED Final   Streptococcus species NOT DETECTED NOT DETECTED Final   Streptococcus agalactiae NOT DETECTED NOT DETECTED Final   Streptococcus pneumoniae NOT DETECTED NOT DETECTED Final   Streptococcus pyogenes NOT  DETECTED NOT DETECTED Final   Acinetobacter baumannii NOT DETECTED NOT DETECTED Final   Enterobacteriaceae species NOT DETECTED NOT DETECTED Final   Enterobacter cloacae complex NOT DETECTED NOT DETECTED Final   Escherichia coli NOT DETECTED NOT DETECTED Final   Klebsiella oxytoca  NOT DETECTED NOT DETECTED Final   Klebsiella pneumoniae NOT DETECTED NOT DETECTED Final   Proteus species NOT DETECTED NOT DETECTED Final   Serratia marcescens NOT DETECTED NOT DETECTED Final   Carbapenem resistance NOT DETECTED NOT DETECTED Final   Haemophilus influenzae NOT DETECTED NOT DETECTED Final   Neisseria meningitidis NOT DETECTED NOT DETECTED Final   Pseudomonas aeruginosa NOT DETECTED NOT DETECTED Final   Candida albicans NOT DETECTED NOT DETECTED Final   Candida glabrata NOT DETECTED NOT DETECTED Final   Candida krusei NOT DETECTED NOT DETECTED Final   Candida parapsilosis NOT DETECTED NOT DETECTED Final   Candida tropicalis NOT DETECTED NOT DETECTED Final  MRSA PCR Screening     Status: None   Collection Time: 10/07/15  1:08 AM  Result Value Ref Range Status   MRSA by PCR NEGATIVE NEGATIVE Final    Comment:        The GeneXpert MRSA Assay (FDA approved for NASAL specimens only), is one component of a comprehensive MRSA colonization surveillance program. It is not intended to diagnose MRSA infection nor to guide or monitor treatment for MRSA infections.   C difficile quick scan w PCR reflex     Status: None   Collection Time: 10/09/15 11:49 AM  Result Value Ref Range Status   C Diff antigen NEGATIVE NEGATIVE Final   C Diff toxin NEGATIVE NEGATIVE Final   C Diff interpretation Negative for toxigenic C. difficile  Final  Culture, blood (routine x 2)     Status: None (Preliminary result)   Collection Time: 10/10/15  9:30 AM  Result Value Ref Range Status   Specimen Description BLOOD RIGHT ANTECUBITAL  Final   Special Requests BOTTLES DRAWN AEROBIC ONLY 10CC  Final   Culture NO GROWTH 1 DAY  Final   Report Status PENDING  Incomplete  Culture, blood (routine x 2)     Status: None (Preliminary result)   Collection Time: 10/10/15  9:40 AM  Result Value Ref Range Status   Specimen Description BLOOD BLOOD RIGHT HAND  Final   Special Requests BOTTLES DRAWN AEROBIC ONLY  5CC  Final   Culture NO GROWTH 1 DAY  Final   Report Status PENDING  Incomplete  Culture, blood (routine x 2)     Status: None (Preliminary result)   Collection Time: 10/10/15  2:25 PM  Result Value Ref Range Status   Specimen Description BLOOD RIGHT ANTECUBITAL  Final   Special Requests BOTTLES DRAWN AEROBIC AND ANAEROBIC 5CC  Final   Culture NO GROWTH < 24 HOURS  Final   Report Status PENDING  Incomplete  Culture, blood (routine x 2)     Status: None (Preliminary result)   Collection Time: 10/10/15  2:35 PM  Result Value Ref Range Status   Specimen Description BLOOD RIGHT ANTECUBITAL  Final   Special Requests BOTTLES DRAWN AEROBIC AND ANAEROBIC 10CC  Final   Culture NO GROWTH < 24 HOURS  Final   Report Status PENDING  Incomplete    Radiology Reports Ct Abdomen Pelvis Wo Contrast  10/10/2015  CLINICAL DATA:  82 year old female with a history of fall and right-sided abdominal pain. EXAM: CT ABDOMEN AND PELVIS WITHOUT CONTRAST TECHNIQUE: Multidetector CT imaging of the abdomen and pelvis  was performed following the standard protocol without IV contrast. COMPARISON:  Prior CT 01/23/2015 FINDINGS: Lower chest: Unremarkable appearance of the soft tissues of the chest wall. Heart size within normal limits.  No pericardial fluid/thickening. Pacing leads partially visualized. Calcifications of aortic valve and coronary calcifications. No lower mediastinal adenopathy. Fluid filled thoracic esophagus.  Small hiatal hernia. No confluent airspace disease, pleural fluid, or pneumothorax within visualized lung. Atelectasis/scarring at the lung bases. Abdomen/pelvis: Unremarkable appearance of liver and spleen. Unremarkable appearance of bilateral adrenal glands. No peripancreatic or pericholecystic fluid or inflammatory changes. Cholecystectomy. No intrahepatic or extrahepatic biliary ductal dilatation. Punctate gas within left-sided biliary ducts. No intra-peritoneal free air or significant free-fluid. No  abnormally dilated small bowel or colon. No transition point. No inflammatory changes of the mesenteries. Enteric contrast reaches the distal colon. Colonic diverticula without evidence of associated inflammatory changes. Normal appendix. Sagittal images demonstrate rectocele. Right Kidney/Ureter: No hydronephrosis. No nephrolithiasis. No perinephric stranding. Unremarkable course of the right ureter. Low-density cystic structure of the lateral right cortex, compatible with benign cyst. Left Kidney/Ureter: No hydronephrosis. No nephrolithiasis. No perinephric stranding. Unremarkable course of the left ureter. Low-density cystic structure of the inferior left cortex, compatible with benign cyst. Unremarkable appearance of the urinary bladder. Hyperdense hematoma within the right abdominal wall musculature, with the component in the rectus musculature measuring 5.3 cm x 6.7 cm. Second more superior component within the oblique musculature on the right measuring 4.0 cm by 9.9 cm. Scattered vascular calcifications of the abdominal aorta. Calcifications of the mesenteric vasculature. Musculoskeletal: Surgical changes of prior bilateral hip arthroplasty. Multilevel degenerative changes of the visualized spine. Acute angulation of the sacrum at the third sacral element. Diffuse osteopenia of the lumbar vertebral levels. Vertebral body heights maintained. IMPRESSION: Acute extraperitoneal hemorrhage involving the right abdominal wall musculature with component of the rectus muscle measuring 6.7 cm and component within the oblique musculature measuring as great as 9.9 cm. Acute angulation at the third sacral element, compatible with fracture. Pelvic floor insufficiency with rectocele. Atherosclerosis and coronary artery disease. These results were called by telephone at the time of interpretation on 10/10/2015 at 1:09 pm to the nurse caring for the patient, Ms Alphonsus Sias who verbally acknowledged these results. Signed,  Dulcy Fanny. Earleen Newport, DO [facility] Electronically Signed   By: Corrie Mckusick D.O.   On: 10/10/2015 13:11   Dg Tibia/fibula Left  10/06/2015  CLINICAL DATA:  Fall 10 days ago. Skin tear with possible infection. Initial encounter. EXAM: LEFT TIBIA AND FIBULA - 2 VIEW COMPARISON:  None. FINDINGS: Soft tissue swelling greatest about the lateral lower leg. There is no soft tissue gas or opaque foreign body. Negative for fracture or signs of osteomyelitis. Osteopenia and knee osteoarthritis. Arterial and soft tissue calcifications. IMPRESSION: Soft tissue swelling without opaque foreign body, soft tissue emphysema, or evidence of osteomyelitis. Electronically Signed   By: Monte Fantasia M.D.   On: 10/06/2015 22:00   Ct Lumbar Spine Wo Contrast  10/07/2015  CLINICAL DATA:  Chronic low back pain. History of fall 3 weeks ago. No known injury. Initial encounter. EXAM: CT LUMBAR SPINE WITHOUT CONTRAST TECHNIQUE: Multidetector CT imaging of the lumbar spine was performed without intravenous contrast administration. Multiplanar CT image reconstructions were also generated. COMPARISON:  CT chest, abdomen and pelvis 05/14/2011. FINDINGS: Vertebral body height and alignment are maintained. No lytic or sclerotic bony lesion is identified. No pars interarticularis defect is seen. Advanced degenerative change is present about the sacroiliac  joints. A subtle cortical lucency in the anterior aspect of the left sacrum on images 117-121 of series 8 and 96-99 of series 5 is worrisome for insufficiency fracture. No other evidence fracture is identified. Imaged intra-abdominal contents demonstrate aortoiliac atherosclerosis. Bilateral renal cysts are partially visualized. The patient is status post cholecystectomy. T10-11: Small calcified disc bulge without central canal or foraminal stenosis. T11-12: Loss of disc space height and facet degenerative change. The central canal  appears open. Bilateral foraminal narrowing is seen. T12-L1: Broad-based disc bulge with endplate spur. The central canal and foramina appear open. L1-2: Shallow broad-based central protrusion with calcification of the annulus. The central canal and foramina appear open. L2-3: Mild disc bulge and endplate spur without central canal or foraminal stenosis. L3-4: There is some ligamentum flavum thickening and a broad-based disc bulge with endplate spur. Mild central canal narrowing is seen. There is also some bilateral foraminal narrowing. L4-5: Disc bulge, endplate spur and facet arthropathy are seen. There is mild to moderate central canal narrowing. Marked bilateral foraminal narrowing is also seen. L5-S1: There is facet degenerative change and a shallow broad-based disc bulge. The central canal and right foramen are open. Mild to moderate left foraminal narrowing is identified. IMPRESSION: Findings highly suspicious for a nondisplaced left sacral fracture. Negative for lumbar spine fracture. Multilevel degenerative disc disease as detailed above. Electronically Signed   By: Inge Rise M.D.   On: 10/07/2015 16:24   Ir Ivc Filter Plmt / S&i /img Guid/mod Sed  10/11/2015  INDICATION: 82 year old female on anticoagulation for DVT. Sustained a right rectus sheath hematoma requiring cessation of anticoagulation. This is an indication for IVC filter placement for PE prophylaxis. EXAM: ULTRASOUND GUIDANCE FOR VASCULARACCESS IVC CATHETERIZATION AND VENOGRAM IVC FILTER INSERTION Interventional Radiologist:  Criselda Peaches, MD MEDICATIONS: None. ANESTHESIA/SEDATION: Fentanyl 50 mcg IV; Versed 1 mg IV Moderate Sedation Time:  10 minutes The patient was continuously monitored during the procedure by the interventional radiology nurse under my direct supervision. FLUOROSCOPY TIME:  Fluoroscopy Time: 0 minutes 24 seconds (45 mGy). COMPLICATIONS: None immediate. Estimated blood loss: None PROCEDURE: Informed written  consent was obtained from the patient after a thorough discussion of the procedural risks, benefits and alternatives. All questions were addressed. Maximal Sterile Barrier Technique was utilized including caps, mask, sterile gowns, sterile gloves, sterile drape, hand hygiene and skin antiseptic. A timeout was performed prior to the initiation of the procedure. Maximal barrier sterile technique utilized including caps, mask, sterile gowns, sterile gloves, large sterile drape, hand hygiene, and Betadine prep. Under sterile condition and local anesthesia, right internal jugular venous access was performed with ultrasound. An ultrasound image was saved and sent to PACS. Over a guidewire, the IVC filter delivery sheath and inner dilator were advanced into the IVC just above the IVC bifurcation. Contrast injection was performed for an IVC venogram using carbon dioxide gas. Through the delivery sheath, a retrievable Denali IVC filter was deployed below the level of the renal veins and above the IVC bifurcation. Limited post deployment venacavagram was performed. The delivery sheath was removed and hemostasis was obtained with manual compression. A dressing was placed. The patient tolerated the procedure well without immediate post procedural complication. FINDINGS: The IVC is patent. No evidence of thrombus, stenosis, or occlusion. No variant venous anatomy. Successful placement of the IVC filter below the level of the renal veins. IMPRESSION: Successful ultrasound and fluoroscopically guided placement of an infrarenal retrievable IVC filter via right jugular approach. Given patient age and limited ability  to ambulate this IVC filter should be considered a permanent device. No dedicated temple we made at follow-up for filter retrieval. However, if the patient's clinical status were to change in the future the filter is potentially retrievable. Signed, Criselda Peaches, MD [facility] Electronically Signed   By: Jacqulynn Cadet M.D.   On: 10/11/2015 11:48   Dg Abd 2 Views  10/09/2015  CLINICAL DATA:  Abdominal pain.  Nausea and diarrhea for 1 day. EXAM: ABDOMEN - 2 VIEW COMPARISON:  CT of 01/23/2015 FINDINGS: Upright and right-sided decubitus views. Dual lead pacer. No free intraperitoneal air. No significant air fluid levels. Bilateral hip arthroplasty. No bowel distension. Cholecystectomy. No abnormal abdominal calcifications. No appendicolith. Vascular calcifications. Cardiomegaly. Right hemidiaphragm elevation. IMPRESSION: No acute findings. Electronically Signed   By: Abigail Miyamoto M.D.   On: 10/09/2015 18:13    Lab Data:  CBC:  Recent Labs Lab 10/06/15 2116 10/08/15 0512 10/09/15 0546 10/10/15 0555 10/10/15 [id number] 10/10/15 1433 10/11/15 0035 10/11/15 0807 10/12/15 1210  WBC 10.5 11.0* 17.2* 19.5*  --   --   --   --  11.8*  NEUTROABS 8.3*  --   --   --   --   --   --   --  9.2*  HGB 11.3* 10.4* 9.7* 7.8* 7.8* 8.0* 8.8* 8.3* 8.2*  HCT 35.5* 32.9* 30.5* 23.2* 24.7* 24.8* 26.8* 24.9* 24.8*  MCV 110.6* 111.9* 112.5* 108.9*  --   --   --   --  100.8*  PLT 190 146* 148* 162  --   --   --   --  [id number]*   Basic Metabolic Panel:  Recent Labs Lab 10/07/15 1435 10/08/15 0512 10/09/15 0546 10/10/15 0555 10/12/15 0545  NA 141 142 142 139 136  K 4.3 3.6 3.3* 3.4* 3.3*  CL 105 105 105 101 103  CO2 28 30 27 26 25   GLUCOSE 150* 95 88 100* 86  BUN 28* 22* 21* 27* 26*  CREATININE 1.44* 1.30* 1.36* 1.66* 1.24*  CALCIUM 8.7* 8.6* 8.4* 8.3* 8.3*  MG 1.9  --   --   --   --    GFR: Estimated Creatinine Clearance: 28.3 mL/min (by C-G formula based on Cr of 1.24). Liver Function Tests:  Recent Labs Lab 10/06/15 2116 10/07/15 1435  AST 27 18  ALT 28 24  ALKPHOS 181* 169*  BILITOT 0.9 1.0  PROT 6.2* 5.6*  ALBUMIN 2.8* 2.5*   No results for input(s): LIPASE, AMYLASE in the last 168 hours. No results for input(s): AMMONIA in the  last 168 hours. Coagulation Profile:  Recent Labs Lab 10/08/15 1154  INR 1.27   Cardiac Enzymes: No results for input(s): CKTOTAL, CKMB, CKMBINDEX, TROPONINI in the last 168 hours. BNP (last 3 results)  Recent Labs  03/18/15 1643  PROBNP 288.0*   HbA1C: No results for input(s): HGBA1C in the last 72 hours. CBG: No results for input(s): GLUCAP in the last 168 hours. Lipid Profile: No results for input(s): CHOL, HDL, LDLCALC, TRIG, CHOLHDL, LDLDIRECT in the last 72 hours. Thyroid Function Tests: No results for input(s): TSH, T4TOTAL, FREET4, T3FREE, THYROIDAB in the last 72 hours. Anemia Panel:  Recent Labs  10/10/15 0923 10/10/15 0937  VITAMINB12 1135*  --   FOLATE  --  57.3   Urine analysis:    Component Value Date/Time   COLORURINE YELLOW 03/18/2015 Amanda 03/18/2015 1643   LABSPEC 1.010 03/18/2015 1643  PHURINE 6.0 03/18/2015 1643   GLUCOSEU NEGATIVE 03/18/2015 1643   GLUCOSEU NEGATIVE 01/23/2015 1606   HGBUR NEGATIVE 03/18/2015 1643   HGBUR large 01/26/2010 1104   BILIRUBINUR NEGATIVE 03/18/2015 [place] 03/18/2015 1643   PROTEINUR NEGATIVE 01/23/2015 1606   UROBILINOGEN 0.2 03/18/2015 1643   NITRITE NEGATIVE 03/18/2015 [place] 03/18/2015 [place] M.D. [facility] 10/12/2015, 1:22 PM  Pager: [phone] Between 7am to 7pm - call Pager - [phone]  After 7pm go to [email or web address] - password [email or web address]  Call night coverage person covering after 7pm

## 2015-10-12 NOTE — Procedures (Signed)
S/P S1 vertebroplasty  via bilateral approach

## 2015-10-12 NOTE — Progress Notes (Signed)
RN as well as Agricultural consultant verified that the pt's fentanyl patches were no longer on the pt. Pharmacy was called & ordered the pt a new one. The new patches were placed on the pt's rt shoulder. Hoover Brunette, RN

## 2015-10-13 ENCOUNTER — Encounter (HOSPITAL_COMMUNITY): Payer: Self-pay | Admitting: General Surgery

## 2015-10-13 LAB — BASIC METABOLIC PANEL
ANION GAP: 8 (ref 5–15)
BUN: 21 mg/dL — AB (ref 6–20)
CALCIUM: 8.5 mg/dL — AB (ref 8.9–10.3)
CO2: 24 mmol/L (ref 22–32)
Chloride: 105 mmol/L (ref 101–111)
Creatinine, Ser: 1.04 mg/dL — ABNORMAL HIGH (ref 0.44–1.00)
GFR calc Af Amer: 53 mL/min — ABNORMAL LOW (ref >60)
GFR, EST NON AFRICAN AMERICAN: 46 mL/min — AB (ref >60)
GLUCOSE: 98 mg/dL (ref 65–99)
Potassium: 4.3 mmol/L (ref 3.5–5.1)
SODIUM: 137 mmol/L (ref 135–145)

## 2015-10-13 LAB — CBC
HCT: 24.5 % — ABNORMAL LOW (ref 36.0–46.0)
HEMOGLOBIN: 8 g/dL — AB (ref 12.0–15.0)
MCH: 33.3 pg (ref 26.0–34.0)
MCHC: 32.7 g/dL (ref 30.0–36.0)
MCV: 102.1 fL — AB (ref 78.0–100.0)
PLATELETS: 153 10*3/uL (ref 150–400)
RBC: 2.4 MIL/uL — AB (ref 3.87–5.11)
RDW: 20.3 % — ABNORMAL HIGH (ref 11.5–15.5)
WBC: 12.9 10*3/uL — AB (ref 4.0–10.5)

## 2015-10-13 MED ORDER — ACETAMINOPHEN 500 MG PO TABS
1000.0000 mg | ORAL_TABLET | Freq: Three times a day (TID) | ORAL | Status: AC
  Administered 2015-10-13 – 2015-10-14 (×3): 1000 mg via ORAL
  Filled 2015-10-13 (×3): qty 2

## 2015-10-13 MED ORDER — LEVOTHYROXINE SODIUM 50 MCG PO TABS
50.0000 ug | ORAL_TABLET | Freq: Every day | ORAL | Status: DC
  Administered 2015-10-13 – 2015-10-15 (×3): 50 ug via ORAL
  Filled 2015-10-13 (×3): qty 1

## 2015-10-13 MED ORDER — HYDROCODONE-ACETAMINOPHEN 5-325 MG PO TABS
1.0000 | ORAL_TABLET | Freq: Three times a day (TID) | ORAL | Status: DC | PRN
  Administered 2015-10-14: 1 via ORAL
  Filled 2015-10-13: qty 1

## 2015-10-13 NOTE — Consult Note (Signed)
Reason for Consult: rectus sheath hematoma Referring Physician: Dr. Estill Cotta   HPI: Nicole Roberts is a 82 year old female with a history of Afib on eliquis prior to admission, moderate MR, HTN, dCHF admitted from an assisted nursing facility with cellulitis of RLE.  She apparently fell 2 weeks ago while trying to get into her wheelchair.  She is wheelchair bound at baseline.  She complained of abdominal pain and has a non contrast CT scan which revealed a rectus sheath hematoma.  Eliquis and then heparin were stopped.  H&h have stabilized.  1 blood transfusion was given on 10/10/15.  We have been asked to evaluate for further management of rectus sheath hematoma at son's request.   Past Medical History  Diagnosis Date  . HEARING LOSS   . MITRAL VALVE INSUFF&AORTIC VALVE INSUFF     moderate MR  . PULMONARY HYPERTENSION   . Atrial fibrillation ([place])     permanent  . SICK SINUS SYNDROME     a. Tachybrady syndrome - [facility] PPM 10/2005.  . Edema 03/19/2009    due to venous insufficiency, chronic lymphedema  . URINARY INCONTINENCE   . Rheumatoid arthritis(714.0) dx clarified 2011    On prednisone  . BREAST CANCER 12/2006    ductal ca s/p L lumpectomy  . Diastolic dysfunction   . HYPERTENSION   . HYPERLIPIDEMIA   . GERD   . HYPOTHYROIDISM   . Hyperparathyroidism     2 lobes removed  . Venous insufficiency     chronic BLE edema  . Spinal stenosis   . Anemia     a. Macrocytic - normal B12, folate 08/2011. b. 3/6 heme positive stools 10/2011 - per PCP note, elected against colonscopy.  [name] TIA (transient ischemic attack) 05/2011  . Anxiety     Past Surgical History  Procedure Laterality Date  . Pacemaker placement  2005    SSS and syncope  . Cholecystectomy  04/06/99  . Abdominal hysterectomy  1970    Partial  . Left hip arthroscopy  1995  . Right hip arthroscopy  01/2001  . Carpal tunnel release  1998    bilateral  . Breast surgery  12/2006    Left breast lupectomy- Ductal CA   . Left parathyroidectomy  07/2008  . Esophagogastroduodenoscopy N/A 10/18/2012    Procedure: ESOPHAGOGASTRODUODENOSCOPY (EGD);  Surgeon: Ladene Artist, MD;  Location: [facility];  Service: Endoscopy;  Laterality: N/A;  . Esophagogastroduodenoscopy (egd) with propofol N/A 01/29/2015    Procedure: ESOPHAGOGASTRODUODENOSCOPY (EGD) WITH PROPOFOL;  Surgeon: Inda Castle, MD;  Location: [facility];  Service: Endoscopy;  Laterality: N/A;  . Ercp N/A 01/29/2015    Procedure: ENDOSCOPIC RETROGRADE CHOLANGIOPANCREATOGRAPHY (ERCP);  Surgeon: Inda Castle, MD;  Location: [facility];  Service: Endoscopy;  Laterality: N/A;    Family History  Problem Relation Age of Onset  . Ovarian cancer Mother   . Coronary artery disease Sister   . Coronary artery disease Brother   . Hypertension Mother     grandparent  . Lung cancer Father     Social History:  reports that she has never smoked. She has never used smokeless tobacco. She reports that she does not drink alcohol or use illicit drugs.  Allergies:  Allergies  Allergen Reactions  . Sulfa Antibiotics Itching  . Tramadol Hcl Itching and Rash    Medications:  Scheduled Meds: . acetaminophen  1,000 mg Oral TID  . calcium-vitamin D  1 tablet Oral BID  . cholecalciferol  1,000 Units Oral Daily  . citalopram  20 mg Oral Daily  . conjugated estrogens  1 Applicatorful Vaginal Once per day on Mon Wed Fri  . fentaNYL  75 mcg Transdermal Q72H  . folic acid  1 mg Oral q morning - 10a  . gabapentin  800 mg Oral QHS  . isosorbide mononitrate  15 mg Oral Daily  . magnesium oxide  400 mg Oral Daily  . metoprolol succinate  50 mg Oral BID  . multivitamin  1 tablet Oral Daily  . nystatin  5 mL Oral QID  . pantoprazole  40 mg Oral BID  . potassium chloride SA  20 mEq Oral Daily  . predniSONE  5 mg Oral Q breakfast  . sodium chloride flush  3 mL Intravenous Q12H  . sucralfate  1 g Oral BID  . vitamin B-12  500 mcg Oral Daily   Continuous  Infusions:  PRN Meds:.sodium chloride, HYDROcodone-acetaminophen, loperamide, nitroGLYCERIN, ondansetron (ZOFRAN) IV, sodium chloride flush   Results for orders placed or performed during the hospital encounter of 10/06/15 (from the past 48 hour(s))  Basic metabolic panel     Status: Abnormal   Collection Time: 10/12/15  5:45 AM  Result Value Ref Range   Sodium 136 135 - 145 mmol/L   Potassium 3.3 (L) 3.5 - 5.1 mmol/L   Chloride 103 101 - 111 mmol/L   CO2 25 22 - 32 mmol/L   Glucose, Bld 86 65 - 99 mg/dL   BUN 26 (H) 6 - 20 mg/dL   Creatinine, Ser 1.24 (H) 0.44 - 1.00 mg/dL   Calcium 8.3 (L) 8.9 - 10.3 mg/dL   GFR calc non Af Amer 37 (L) >60 mL/min   GFR calc Af Amer 43 (L) >60 mL/min    Comment: (NOTE) The eGFR has been calculated using the CKD EPI equation. This calculation has not been validated in all clinical situations. eGFR's persistently <60 mL/min signify possible Chronic Kidney Disease.    Anion gap 8 5 - 15  Procalcitonin     Status: None   Collection Time: 10/12/15  5:45 AM  Result Value Ref Range   Procalcitonin 0.16 ng/mL    Comment:        Interpretation: PCT (Procalcitonin) <= 0.5 ng/mL: Systemic infection (sepsis) is not likely. Local bacterial infection is possible. (NOTE)         ICU PCT Algorithm               Non ICU PCT Algorithm    ----------------------------     ------------------------------         PCT < 0.25 ng/mL                 PCT < 0.1 ng/mL     Stopping of antibiotics            Stopping of antibiotics       strongly encouraged.               strongly encouraged.    ----------------------------     ------------------------------       PCT level decrease by               PCT < 0.25 ng/mL       >= 80% from peak PCT       OR PCT 0.25 - 0.5 ng/mL          Stopping of antibiotics  encouraged.     Stopping of antibiotics           encouraged.    ----------------------------      ------------------------------       PCT level decrease by              PCT >= 0.25 ng/mL       < 80% from peak PCT        AND PCT >= 0.5 ng/mL            Continuin g antibiotics                                              encouraged.       Continuing antibiotics            encouraged.    ----------------------------     ------------------------------     PCT level increase compared          PCT > 0.5 ng/mL         with peak PCT AND          PCT >= 0.5 ng/mL             Escalation of antibiotics                                          strongly encouraged.      Escalation of antibiotics        strongly encouraged.   CBC with Differential/Platelet     Status: Abnormal   Collection Time: 10/12/15 12:10 PM  Result Value Ref Range   WBC 11.8 (H) 4.0 - 10.5 K/uL   RBC 2.46 (L) 3.87 - 5.11 MIL/uL   Hemoglobin 8.2 (L) 12.0 - 15.0 g/dL   HCT 24.8 (L) 36.0 - 46.0 %   MCV 100.8 (H) 78.0 - 100.0 fL   MCH 33.3 26.0 - 34.0 pg   MCHC 33.1 30.0 - 36.0 g/dL   RDW 20.9 (H) 11.5 - 15.5 %   Platelets 149 (L) 150 - 400 K/uL   Neutrophils Relative % 78 %   Lymphocytes Relative 9 %   Monocytes Relative 10 %   Eosinophils Relative 2 %   Basophils Relative 1 %   Neutro Abs 9.2 (H) 1.7 - 7.7 K/uL   Lymphs Abs 1.1 0.7 - 4.0 K/uL   Monocytes Absolute 1.2 (H) 0.1 - 1.0 K/uL   Eosinophils Absolute 0.2 0.0 - 0.7 K/uL   Basophils Absolute 0.1 0.0 - 0.1 K/uL   RBC Morphology RARE NRBCs     Comment: BASOPHILIC STIPPLING BURR CELLS POLYCHROMASIA PRESENT    WBC Morphology INCREASED BANDS (>20% BANDS)     Comment: MILD LEFT SHIFT (1-5% METAS, OCC MYELO, OCC BANDS)  CBC     Status: Abnormal   Collection Time: 10/13/15  5:20 AM  Result Value Ref Range   WBC 12.9 (H) 4.0 - 10.5 K/uL   RBC 2.40 (L) 3.87 - 5.11 MIL/uL   Hemoglobin 8.0 (L) 12.0 - 15.0 g/dL   HCT 24.5 (L) 36.0 - 46.0 %   MCV 102.1 (H) 78.0 - 100.0 fL   MCH 33.3 26.0 - 34.0 pg   MCHC 32.7 30.0 - 36.0 g/dL   RDW 20.3 (H) 11.5 - 15.5 %  Platelets 153 150 - 400 K/uL  Basic metabolic panel     Status: Abnormal   Collection Time: 10/13/15  5:20 AM  Result Value Ref Range   Sodium 137 135 - 145 mmol/L   Potassium 4.3 3.5 - 5.1 mmol/L    Comment: DELTA CHECK NOTED   Chloride 105 101 - 111 mmol/L   CO2 24 22 - 32 mmol/L   Glucose, Bld 98 65 - 99 mg/dL   BUN 21 (H) 6 - 20 mg/dL   Creatinine, Ser 6.33 (H) 0.44 - 1.00 mg/dL   Calcium 8.5 (L) 8.9 - 10.3 mg/dL   GFR calc non Af Amer 46 (L) >60 mL/min   GFR calc Af Amer 53 (L) >60 mL/min    Comment: (NOTE) The eGFR has been calculated using the CKD EPI equation. This calculation has not been validated in all clinical situations. eGFR's persistently <60 mL/min signify possible Chronic Kidney Disease.    Anion gap 8 5 - 15    Ir Vertebroplasty Lumbar Bx Inc Uni/bil Inc Inject/imaging  10/13/2015  INDICATION: Severe sacral pain.  Sacral insufficiency fractures. EXAM: IR VERTEBROPLASTY SACRAL 1 VERTEBROPLASTY MEDICATIONS: As antibiotic prophylaxis, 2 g Ancef IV was ordered pre-procedure and administered intravenously within 1 hour of incision. ANESTHESIA/SEDATION: Moderate (conscious) sedation was employed during this procedure. A total of Versed 1 mg and Fentanyl 25 mcg was administered intravenously. Moderate Sedation Time: 25 minutes. The patient's level of consciousness and vital signs were monitored continuously by radiology nursing throughout the procedure under my direct supervision. FLUOROSCOPY TIME:  Fluoroscopy Time: 10 minutes 12 seconds (713 mGy) COMPLICATIONS: None immediate. TECHNIQUE: Informed written consent was obtained from the patient after a thorough discussion of the procedural risks, benefits and alternatives. All questions were addressed. Maximal Sterile Barrier Technique was utilized including caps, mask, sterile gowns, sterile gloves, sterile drape, hand hygiene and skin antiseptic. A timeout was performed prior to the initiation of the procedure. PROCEDURE: The  patient was placed prone on the fluoroscopic table. Nasal oxygen was administered. Physiologic monitoring was performed throughout the duration of the procedure. The skin overlying the sacral region was prepped and draped in the usual sterile fashion. The skin entry sites were identified and infiltrated with 0.25% bupivacaine and carried to the S1 vertebral bodies. This was then followed by the advancement of a 13-gauge Cook needles into the anterior 1/3 at S1 bilaterally. Gentle contrast injection demonstrated a trabecular pattern contrast with early opacification into the soft tissues. This prompted the use of Gel-Foam pledgets through the 13 gauge Cook spinal needles prior to the delivery of the methylmethacrylate mixture. At this time, methylmethacrylate mixture was reconstituted. Under biplane intermittent fluoroscopy, the methylmethacrylate was then injected into the S1 vertebral body with filling of the vertebral body. No extravasation was noted into the disk spaces or into the presacral space, or into the parapelvic veins. The needle was then removed. Hemostasis was achieved at the skin entry sites. There were no acute complications. The patient tolerated the procedure well. The patient was then returned to her floor in stable condition. IMPRESSION: Status post vertebral body augmentation for painful compression fractures at S1 using vertebroplasty technique. Electronically Signed   By: Julieanne Cotton M.D.   On: 10/12/2015 15:32    Review of Systems  Constitutional: Negative for fever, chills and malaise/fatigue.  Cardiovascular: Positive for leg swelling. Negative for chest pain and palpitations.  Gastrointestinal: Positive for abdominal pain. Negative for nausea and vomiting.  Genitourinary: Negative for dysuria, urgency, frequency,  hematuria and flank pain.  Musculoskeletal: Positive for back pain.  Neurological: Negative for dizziness, tingling, tremors, sensory change, speech change, focal  weakness, seizures and loss of consciousness.   Blood pressure 108/56, pulse 76, temperature 98.1 F (36.7 C), temperature source Oral, resp. rate 20, height '5\' 4"'$  (1.626 m), weight 153.2 kg (337 lb 11.9 oz), SpO2 99 %. Physical Exam  Constitutional: She is oriented to person, place, and time. She appears well-developed and well-nourished. No distress.  Cardiovascular:  s1s2 irregularly irregular   Respiratory: Effort normal and breath sounds normal. No respiratory distress. She has no wheezes. She has no rales. She exhibits no tenderness.  GI: Soft. Bowel sounds are normal.  ttp right flank.   Musculoskeletal: She exhibits edema.  +4 pedal edema   Neurological: She is alert and oriented to person, place, and time.  Skin: Skin is warm and dry. She is not diaphoretic.  Psychiatric: She has a normal mood and affect. Her behavior is normal.    Assessment/Plan: Chronic atrial fibrillation on eliquis dCHF S/p sacroplasty 6/12 Recent fall   Rectus sheath hematoma-agree with Dr. Tana Coast, supportive care.  I'm not quite sure about the "glue" procedure.  I discussed this with the patient's son The standard of care is; stop anticoagulation for 7-14 days, monitor CBC, transfuse as needed, bdominal binder. May refer to uptodate. Wound not recommend repeating a CT scan.  The hematoma will absorb itself with time.  Thank you for the consult.  Please call for further assistance.   Tarnesha Ulloa ANP-BC 10/13/2015, 2:12 PM

## 2015-10-13 NOTE — NC FL2 (Signed)
[place] LEVEL OF CARE SCREENING TOOL     IDENTIFICATION  Patient Name: Nicole Roberts Birthdate: [birth year] Sex: female Admission Date (Current Location): 10/06/2015  [facility] and [place] Number:  Herbalist and Address:  [facility], [place] [address] [place], [place] [address]      Provider Number: [id number]  Attending Physician Name and Address:  Mendel Corning, MD  Relative Name and Phone Number:  Arnell Sieving, son, [phone]    Current Level of Care: Hospital Recommended Level of Care: [place] Prior Approval Number:    Date Approved/Denied:   PASRR Number: [id number]  Discharge Plan: SNF    Current Diagnoses: Patient Active Problem List   Diagnosis Date Noted  . Cellulitis of left lower extremity   . Cellulitis 10/06/2015  . Thrush 09/21/2015  . Oral ulcer 09/21/2015  . Chronic congestive heart failure with left ventricular diastolic dysfunction ([place]) 05/06/2015  . Symptomatic bradycardia 05/06/2015  . Acute gallstone pancreatitis 02/05/2015  . Gastritis and gastroduodenitis 01/30/2015  . Possible Acute cholangitis 01/30/2015  . Choledocholithiasis 01/29/2015  . Edema leg 01/27/2015  . Diarrhea   . Duodenitis 01/26/2015  . Sepsis due to Escherichia coli (E. coli) ([place]) 01/25/2015  . Thrombocytopenia ([place]) 01/25/2015  . Protein-calorie malnutrition, moderate ([place]) 01/25/2015  . Wheelchair bound 01/25/2015  . Acute encephalopathy 01/25/2015  . Recurrent cold sores 01/25/2015  . Acute pancreatitis 01/23/2015  . Abdominal pain, epigastric 01/23/2015  . Hypokalemia 01/23/2015  . Leukocytosis 01/23/2015  . Neuropathy ([place]) 01/23/2015  . Chronic combined systolic and diastolic CHF (congestive heart failure) ([place]) 01/23/2015  . Depression   . Stage III chronic kidney disease 04/06/2012  . Atrial fibrillation ([place]) 02/05/2012  . Rheumatoid arthritis ([place]) 01/26/2010  . Hypothyroidism 05/21/2009  .  Dyslipidemia 05/21/2009  . Essential hypertension 05/21/2009  . GERD 05/21/2009    Orientation RESPIRATION BLADDER Height & Weight     Self, Time, Situation, Place  Normal Incontinent Weight: (!) 153.2 kg (337 lb 11.9 oz) Height:  5\' 4"  (162.6 cm)  BEHAVIORAL SYMPTOMS/MOOD NEUROLOGICAL BOWEL NUTRITION STATUS      Incontinent Diet (Please see DC Summary)  AMBULATORY STATUS COMMUNICATION OF NEEDS Skin   Extensive Assist Verbally Surgical wounds (Incisions on neck and sacrum)                       Personal Care Assistance Level of Assistance  Bathing, Feeding, Dressing Bathing Assistance: Maximum assistance Feeding assistance: Independent Dressing Assistance: Maximum assistance     Functional Limitations Info  Hearing   Hearing Info: Impaired      SPECIAL CARE FACTORS FREQUENCY  PT (By licensed PT)     PT Frequency: not yet assessed              Contractures      Additional Factors Info  Code Status, Allergies Code Status Info: DNR Allergies Info: Sulfa Antibiotics, Tramadol Hcl           Current Medications (10/13/2015):  This is the current hospital active medication list Current Facility-Administered Medications  Medication Dose Route Frequency Provider Last Rate Last Dose  . 0.9 %  sodium chloride infusion  250 mL Intravenous PRN Phillips Grout, MD 10 mL/hr at 10/07/15 0439 250 mL at 10/07/15 0439  . acetaminophen (TYLENOL) tablet 1,000 mg  1,000 mg Oral TID Ripudeep Krystal Eaton, MD   1,000 mg at 10/13/15 1527  . calcium-vitamin D (OSCAL WITH D)  500-200 MG-UNIT per tablet 1 tablet  1 tablet Oral BID Ripudeep Krystal Eaton, MD   1 tablet at 10/13/15 1025  . cholecalciferol (VITAMIN D) tablet 1,000 Units  1,000 Units Oral Daily Phillips Grout, MD   1,000 Units at 10/13/15 1025  . citalopram (CELEXA) tablet 20 mg  20 mg Oral Daily Phillips Grout, MD   20 mg at 10/13/15 1026  . conjugated estrogens (PREMARIN) vaginal cream 1 Applicatorful  1 Applicatorful Vaginal Once  per day on Mon Wed Fri Phillips Grout, MD   1 Applicatorful at [id number]  . fentaNYL (DURAGESIC - dosed mcg/hr) patch 75 mcg  75 mcg Transdermal Q72H Ripudeep Krystal Eaton, MD   75 mcg at 10/12/15 1827  . folic acid (FOLVITE) tablet 1 mg  1 mg Oral q morning - 10a Ripudeep K Rai, MD   1 mg at 10/13/15 1025  . gabapentin (NEURONTIN) capsule 800 mg  800 mg Oral QHS Phillips Grout, MD   800 mg at 10/12/15 2150  . HYDROcodone-acetaminophen (NORCO/VICODIN) 5-325 MG per tablet 1 tablet  1 tablet Oral Q8H PRN Ripudeep K Rai, MD      . isosorbide mononitrate (IMDUR) 24 hr tablet 15 mg  15 mg Oral Daily Phillips Grout, MD   15 mg at 10/13/15 1026  . levothyroxine (SYNTHROID, LEVOTHROID) tablet 50 mcg  50 mcg Oral QAC breakfast Ripudeep Krystal Eaton, MD   50 mcg at 10/13/15 1527  . loperamide (IMODIUM) capsule 2 mg  2 mg Oral Q6H PRN Ripudeep Krystal Eaton, MD   2 mg at 10/09/15 1420  . magnesium oxide (MAG-OX) tablet 400 mg  400 mg Oral Daily Phillips Grout, MD   400 mg at 10/13/15 1025  . metoprolol succinate (TOPROL-XL) 24 hr tablet 50 mg  50 mg Oral BID Phillips Grout, MD   50 mg at 10/13/15 1026  . multivitamin (RENA-VIT) tablet 1 tablet  1 tablet Oral Daily Phillips Grout, MD   1 tablet at 10/13/15 1025  . nitroGLYCERIN (NITROSTAT) SL tablet 0.4 mg  0.4 mg Sublingual Q5 Min x 3 PRN Phillips Grout, MD      . nystatin (MYCOSTATIN) 100000 UNIT/ML suspension 500,000 Units  5 mL Oral QID Phillips Grout, MD   500,000 Units at 10/13/15 1358  . ondansetron (ZOFRAN) injection 4 mg  4 mg Intravenous Q6H PRN Ripudeep Krystal Eaton, MD   4 mg at 10/09/15 1413  . pantoprazole (PROTONIX) EC tablet 40 mg  40 mg Oral BID Phillips Grout, MD   40 mg at 10/13/15 1026  . potassium chloride SA (K-DUR,KLOR-CON) CR tablet 20 mEq  20 mEq Oral Daily Phillips Grout, MD   20 mEq at 10/13/15 1026  . predniSONE (DELTASONE) tablet 5 mg  5 mg Oral Q breakfast Phillips Grout, MD   5 mg at 10/13/15 1025  . sodium chloride flush (NS) 0.9 % injection 3 mL  3 mL  Intravenous Q12H Phillips Grout, MD   3 mL at 10/13/15 1027  . sodium chloride flush (NS) 0.9 % injection 3 mL  3 mL Intravenous PRN Phillips Grout, MD      . sucralfate (CARAFATE) 1 GM/10ML suspension 1 g  1 g Oral BID Phillips Grout, MD   1 g at 10/13/15 1026  . vitamin B-12 (CYANOCOBALAMIN) tablet 500 mcg  500 mcg Oral Daily Phillips Grout, MD   500 mcg at 10/13/15 1025  Discharge Medications: Please see discharge summary for a list of discharge medications.  Relevant Imaging Results:  Relevant Lab Results:   Additional Information SSN: [place], [place]

## 2015-10-13 NOTE — Care Management Obs Status (Deleted)
[place] NOTIFICATION   Patient Details  Name: Carrisa Roa MRN: [id number] Date of Birth: May 24, 1921   Medicare Observation Status Notification Given:  Yes    Sharin Mons, RN 10/13/2015, 1:09 PM

## 2015-10-13 NOTE — Progress Notes (Signed)
[facility]                                                                              Patient Demographics  Nicole Roberts, is a 82 y.o. female, DOB - 09/15/1921, [id number]  Admit date - 10/06/2015   Admitting Physician Phillips Grout, MD  Outpatient Primary MD for the patient is [name], MD  Outpatient specialists:   LOS - 7  days    Chief Complaint  Patient presents with  . Leg Pain  . Cellulitis       Brief summary   Patient is an 82 year old from [place] skilled nursing facility with history of atrial fibrillation, hypertension and hyperlipidemia, GERD, hypothyroidism, anemia, TIA presented to ED with cellulitis, leg pain. Patient apparently fell and hit her leg over 3 weeks ago and developed a skin tear. About a week prior to admission he started to get more red and erythematous. She was started on Keflex 2 days prior to admission. The redness worsened despite antibiotics over to the anterior leg up to the knee. No fevers. Patient was admitted for cellulitis.   Assessment & Plan    Principal Problem:   Cellulitis- - Outpatient failure to Keflex - cont IV vancomycin and Zosyn- Positive shunt to oral Augmentin - Wound care consulted  Active Problems: Acute on chronic back pain, recent fall, radiating to legs Likely due to sacral fracture - CT of the lumbar spine showed left sacral fracture - IR consulted for kyphoplasty,, Done on 6/12 - I have decreased Norco to every 8 hours as needed as patient is also on fentanyl patch, per patient's son, she is groggy on narcotics. I have placed her on scheduled Tylenol for 24 hours and reassess in a.m. - Patient was on fentanyl patch prior to admission, will continue  Right leg pain  due to right leg DVT - Doppler ultrasound of LE positive for DVT in the right posterior tibial and peroneal vein.  - Patient was on eliquis with renal dosing prior to admission, and, then placed on IV heparin drip now  discontinued due to rectus sheath hematoma  - IVC filter placed on 6/11  Rectus sheath abd hematoma/Acute blood loss anemia - Abdominal x-ray 6/9 was negative for any SBO or perforation - CT abd : Acute extraperitoneal hemorrhage/rectus sheath hematoma 6.7x 9.9cm - Continue conservative management, appreciate surgery consult     Atrial fibrillation (HCC) - Currently rate controlled currently not on any anticoagulation due to abdominal wall hematoma - Per patient's son, he feels the stroke risk is high if she is off AC and wanted to restart eliquis and have surgical option for rectus sheath hematoma. I appreciate cardiology recommendations, recommended restart eliquis 2.5 mg twice a day when okay with surgery - Discussed with patient's son, general consensus is to hold AC 7-14 days for rectus sheath hematoma and restart if hematoma is not expanding and hemoglobin is stable. Patient's son wanted to explore surgical options, hence called general surgery consult and also opinion regarding anticoagulation.    Essential hypertension - Currently controlled - Continue furosemide, Imdur, metoprolol    Rheumatoid arthritis ([place]) -  On chronic steroids    Mild acute on chronic Stage III chronic kidney disease - Baseline creatinine 1.3-1.4, currently at baseline    Chronic combined systolic and diastolic CHF (congestive heart failure) (HCC) -Currently compensated   Macrocytic anemia B12, folate within normal limits, check stool occult test   Code Status: dnr  DVT Prophylaxis: apixaban on hold, heparin drip on hold. DVT in right LE   Family Communication: Discussed in detail with the patient, all imaging results, lab results explained to the patient and patient's son    Disposition Plan: Currently at assisted living facility, PT evaluation placed, will likely need skilled nursing facility given significant debility due to recent compression fracture, DVT and rectus sheath hematoma  Time  Spent in minutes   25 minutes  Procedures:  CT lumbar spine CT abdomen Doppler ultrasound of the lower extremities  Consultants:   Interventional radiology Cardiology  Antimicrobials:   IV vancomycin 6/6-> 6/11  IV Zosyn 6/7 > 6/11   Medications  Scheduled Meds: . calcium-vitamin D  1 tablet Oral BID  . cholecalciferol  1,000 Units Oral Daily  . citalopram  20 mg Oral Daily  . conjugated estrogens  1 Applicatorful Vaginal Once per day on Mon Wed Fri  . fentaNYL  75 mcg Transdermal Q72H  . folic acid  1 mg Oral q morning - 10a  . gabapentin  800 mg Oral QHS  . isosorbide mononitrate  15 mg Oral Daily  . magnesium oxide  400 mg Oral Daily  . metoprolol succinate  50 mg Oral BID  . multivitamin  1 tablet Oral Daily  . nystatin  5 mL Oral QID  . pantoprazole  40 mg Oral BID  . potassium chloride SA  20 mEq Oral Daily  . predniSONE  5 mg Oral Q breakfast  . sodium chloride flush  3 mL Intravenous Q12H  . sucralfate  1 g Oral BID  . vitamin B-12  500 mcg Oral Daily   Continuous Infusions:   PRN Meds:.sodium chloride, HYDROcodone-acetaminophen, loperamide, nitroGLYCERIN, ondansetron (ZOFRAN) IV, sodium chloride flush   Antibiotics   Anti-infectives    Start     Dose/Rate Route Frequency Ordered Stop   10/12/15 1339  ceFAZolin (ANCEF) 2-3 GM-% IVPB SOLR    Comments:  Laughlin, Amy   : cabinet override      10/12/15 1339 10/13/15 0144   10/12/15 1328  tobramycin (NEBCIN) 1.2 g powder    Comments:  Hines, Kristopher   : cabinet override      10/12/15 1328 10/12/15 1346   10/11/15 2200  amoxicillin-clavulanate (AUGMENTIN) 250-125 MG per tablet 1 tablet     1 tablet Oral Every 12 hours 10/11/15 1459 10/12/15 2149   10/07/15 2000  vancomycin (VANCOCIN) 500 mg in sodium chloride 0.9 % 100 mL IVPB  Status:  Discontinued     500 mg 100 mL/hr over 60 Minutes Intravenous Every 24 hours 10/07/15 0119 10/11/15 1459   10/07/15 0400  piperacillin-tazobactam (ZOSYN) IVPB 3.375 g   Status:  Discontinued     3.375 g 12.5 mL/hr over 240 Minutes Intravenous Every 8 hours 10/07/15 0119 10/11/15 1459   10/06/15 2045  piperacillin-tazobactam (ZOSYN) IVPB 3.375 g     3.375 g 100 mL/hr over 30 Minutes Intravenous  Once 10/06/15 2039 10/07/15 0018   10/06/15 2045  vancomycin (VANCOCIN) IVPB 1000 mg/200 mL premix     1,000 mg 200 mL/hr over 60 Minutes Intravenous  Once 10/06/15 2039 10/07/15 0018  Subjective:   Nicole Roberts was seen and examined today. Somewhat sleepy today, had received Norco at 5:30. No abdominal pain, abdomen soft, on removing the binder.  No fevers or chills.  Patient denies  chest pain, shortness of breath. No acute events overnight.    Objective:   Filed Vitals:   10/12/15 1410 10/12/15 2110 10/13/15 0500 10/13/15 0622  BP: 149/89 109/54  108/56  Pulse: 80 78  76  Temp:  98.2 F (36.8 C)  98.1 F (36.7 C)  TempSrc:    Oral  Resp: 16 16  20   Height:      Weight:   153.2 kg (337 lb 11.9 oz)   SpO2: 97% 100%  99%   No intake or output data in the 24 hours ending 10/13/15 1341   Wt Readings from Last 3 Encounters:  10/13/15 153.2 kg (337 lb 11.9 oz)  08/10/15 70.217 kg (154 lb 12.8 oz)  06/08/15 74.844 kg (165 lb)     Exam  General: Somewhat sleepy but arousable  HEENT:   Neck:   Cardiovascular: S1 S2 clear, RRR  Respiratory: Clear to auscultation bilaterally, no wheezing, rales or rhonchi  Gastrointestinal: Soft, nontender, nondistended  Ext: no cyanosis clubbing or edema  Neuro: no new deficits  Skin: Dressing intact on the lower extremities  Psych: somewhat sleepy   Data Reviewed:  I have personally reviewed following labs and imaging studies  Micro Results Recent Results (from the past 240 hour(s))  Blood Culture (routine x 2)     Status: None   Collection Time: 10/06/15  9:16 PM  Result Value Ref Range Status   Specimen Description BLOOD RIGHT ARM  Final   Special Requests BOTTLES DRAWN AEROBIC  ONLY 10CC  Final   Culture NO GROWTH 5 DAYS  Final   Report Status 10/11/2015 FINAL  Final  Blood Culture (routine x 2)     Status: Abnormal   Collection Time: 10/06/15  9:38 PM  Result Value Ref Range Status   Specimen Description BLOOD LEFT ANTECUBITAL  Final   Special Requests BOTTLES DRAWN AEROBIC ONLY 5CC  Final   Culture  Setup Time   Final    GRAM POSITIVE COCCI IN CLUSTERS AEROBIC BOTTLE ONLY CRITICAL RESULT CALLED TO, READ BACK BY AND VERIFIED WITH: C [facility] D 1135 [id number] M WILSON    Culture (A)  Final    STAPHYLOCOCCUS SPECIES (COAGULASE NEGATIVE) THE SIGNIFICANCE OF ISOLATING THIS ORGANISM FROM A SINGLE SET OF BLOOD CULTURES WHEN MULTIPLE SETS ARE DRAWN IS UNCERTAIN. PLEASE NOTIFY THE MICROBIOLOGY DEPARTMENT WITHIN ONE WEEK IF SPECIATION AND SENSITIVITIES ARE REQUIRED.    Report Status 10/11/2015 FINAL  Final  Blood Culture ID Panel (Reflexed)     Status: Abnormal   Collection Time: 10/06/15  9:38 PM  Result Value Ref Range Status   Enterococcus species NOT DETECTED NOT DETECTED Final   Vancomycin resistance NOT DETECTED NOT DETECTED Final   Listeria monocytogenes NOT DETECTED NOT DETECTED Final   Staphylococcus species DETECTED (A) NOT DETECTED Corrected    Comment: CRITICAL RESULT CALLED TO, READ BACK BY AND VERIFIED WITH: CLamont Snowball. D. 11:35 10/08/15 (wilsonm) CORRECTED ON 06/09 AT 1034: PREVIOUSLY REPORTED AS DETECTED C. Lamont Snowball. D. 11:35 10/08/15 ([email or web address])    Staphylococcus aureus NOT DETECTED NOT DETECTED Final   Methicillin resistance NOT DETECTED NOT DETECTED Final   Streptococcus species NOT DETECTED NOT DETECTED Final   Streptococcus agalactiae NOT DETECTED NOT DETECTED Final   Streptococcus pneumoniae NOT DETECTED  NOT DETECTED Final   Streptococcus pyogenes NOT DETECTED NOT DETECTED Final   Acinetobacter baumannii NOT DETECTED NOT DETECTED Final   Enterobacteriaceae species NOT DETECTED NOT DETECTED Final   Enterobacter cloacae complex NOT  DETECTED NOT DETECTED Final   Escherichia coli NOT DETECTED NOT DETECTED Final   Klebsiella oxytoca NOT DETECTED NOT DETECTED Final   Klebsiella pneumoniae NOT DETECTED NOT DETECTED Final   Proteus species NOT DETECTED NOT DETECTED Final   Serratia marcescens NOT DETECTED NOT DETECTED Final   Carbapenem resistance NOT DETECTED NOT DETECTED Final   Haemophilus influenzae NOT DETECTED NOT DETECTED Final   Neisseria meningitidis NOT DETECTED NOT DETECTED Final   Pseudomonas aeruginosa NOT DETECTED NOT DETECTED Final   Candida albicans NOT DETECTED NOT DETECTED Final   Candida glabrata NOT DETECTED NOT DETECTED Final   Candida krusei NOT DETECTED NOT DETECTED Final   Candida parapsilosis NOT DETECTED NOT DETECTED Final   Candida tropicalis NOT DETECTED NOT DETECTED Final  MRSA PCR Screening     Status: None   Collection Time: 10/07/15  1:08 AM  Result Value Ref Range Status   MRSA by PCR NEGATIVE NEGATIVE Final    Comment:        The GeneXpert MRSA Assay (FDA approved for NASAL specimens only), is one component of a comprehensive MRSA colonization surveillance program. It is not intended to diagnose MRSA infection nor to guide or monitor treatment for MRSA infections.   C difficile quick scan w PCR reflex     Status: None   Collection Time: 10/09/15 11:49 AM  Result Value Ref Range Status   C Diff antigen NEGATIVE NEGATIVE Final   C Diff toxin NEGATIVE NEGATIVE Final   C Diff interpretation Negative for toxigenic C. difficile  Final  Culture, blood (routine x 2)     Status: None (Preliminary result)   Collection Time: 10/10/15  9:30 AM  Result Value Ref Range Status   Specimen Description BLOOD RIGHT ANTECUBITAL  Final   Special Requests BOTTLES DRAWN AEROBIC ONLY 10CC  Final   Culture NO GROWTH 2 DAYS  Final   Report Status PENDING  Incomplete  Culture, blood (routine x 2)     Status: None (Preliminary result)   Collection Time: 10/10/15  9:40 AM  Result Value Ref Range  Status   Specimen Description BLOOD BLOOD RIGHT HAND  Final   Special Requests BOTTLES DRAWN AEROBIC ONLY 5CC  Final   Culture NO GROWTH 2 DAYS  Final   Report Status PENDING  Incomplete  Culture, blood (routine x 2)     Status: None (Preliminary result)   Collection Time: 10/10/15  2:25 PM  Result Value Ref Range Status   Specimen Description BLOOD RIGHT ANTECUBITAL  Final   Special Requests BOTTLES DRAWN AEROBIC AND ANAEROBIC 5CC  Final   Culture NO GROWTH 2 DAYS  Final   Report Status PENDING  Incomplete  Culture, blood (routine x 2)     Status: None (Preliminary result)   Collection Time: 10/10/15  2:35 PM  Result Value Ref Range Status   Specimen Description BLOOD RIGHT ANTECUBITAL  Final   Special Requests BOTTLES DRAWN AEROBIC AND ANAEROBIC 10CC  Final   Culture NO GROWTH 2 DAYS  Final   Report Status PENDING  Incomplete    Radiology Reports Ct Abdomen Pelvis Wo Contrast  10/10/2015  CLINICAL DATA:  82 year old female with a history of fall and right-sided abdominal pain. EXAM: CT ABDOMEN AND PELVIS WITHOUT CONTRAST TECHNIQUE: Multidetector CT  imaging of the abdomen and pelvis was performed following the standard protocol without IV contrast. COMPARISON:  Prior CT 01/23/2015 FINDINGS: Lower chest: Unremarkable appearance of the soft tissues of the chest wall. Heart size within normal limits.  No pericardial fluid/thickening. Pacing leads partially visualized. Calcifications of aortic valve and coronary calcifications. No lower mediastinal adenopathy. Fluid filled thoracic esophagus.  Small hiatal hernia. No confluent airspace disease, pleural fluid, or pneumothorax within visualized lung. Atelectasis/scarring at the lung bases. Abdomen/pelvis: Unremarkable appearance of liver and spleen. Unremarkable appearance of bilateral adrenal glands. No peripancreatic or pericholecystic fluid or inflammatory changes. Cholecystectomy. No intrahepatic or extrahepatic biliary ductal dilatation.  Punctate gas within left-sided biliary ducts. No intra-peritoneal free air or significant free-fluid. No abnormally dilated small bowel or colon. No transition point. No inflammatory changes of the mesenteries. Enteric contrast reaches the distal colon. Colonic diverticula without evidence of associated inflammatory changes. Normal appendix. Sagittal images demonstrate rectocele. Right Kidney/Ureter: No hydronephrosis. No nephrolithiasis. No perinephric stranding. Unremarkable course of the right ureter. Low-density cystic structure of the lateral right cortex, compatible with benign cyst. Left Kidney/Ureter: No hydronephrosis. No nephrolithiasis. No perinephric stranding. Unremarkable course of the left ureter. Low-density cystic structure of the inferior left cortex, compatible with benign cyst. Unremarkable appearance of the urinary bladder. Hyperdense hematoma within the right abdominal wall musculature, with the component in the rectus musculature measuring 5.3 cm x 6.7 cm. Second more superior component within the oblique musculature on the right measuring 4.0 cm by 9.9 cm. Scattered vascular calcifications of the abdominal aorta. Calcifications of the mesenteric vasculature. Musculoskeletal: Surgical changes of prior bilateral hip arthroplasty. Multilevel degenerative changes of the visualized spine. Acute angulation of the sacrum at the third sacral element. Diffuse osteopenia of the lumbar vertebral levels. Vertebral body heights maintained. IMPRESSION: Acute extraperitoneal hemorrhage involving the right abdominal wall musculature with component of the rectus muscle measuring 6.7 cm and component within the oblique musculature measuring as great as 9.9 cm. Acute angulation at the third sacral element, compatible with fracture. Pelvic floor insufficiency with rectocele. Atherosclerosis and coronary artery disease. These results were called by telephone at the time of interpretation on 10/10/2015 at 1:09 pm  to the nurse caring for the patient, Ms Alphonsus Sias who verbally acknowledged these results. Signed, Dulcy Fanny. Earleen Newport, DO [facility] Radiology Electronically Signed   By: Corrie Mckusick D.O.   On: 10/10/2015 13:11   Dg Tibia/fibula Left  10/06/2015  CLINICAL DATA:  Fall 10 days ago. Skin tear with possible infection. Initial encounter. EXAM: LEFT TIBIA AND FIBULA - 2 VIEW COMPARISON:  None. FINDINGS: Soft tissue swelling greatest about the lateral lower leg. There is no soft tissue gas or opaque foreign body. Negative for fracture or signs of osteomyelitis. Osteopenia and knee osteoarthritis. Arterial and soft tissue calcifications. IMPRESSION: Soft tissue swelling without opaque foreign body, soft tissue emphysema, or evidence of osteomyelitis. Electronically Signed   By: Monte Fantasia M.D.   On: 10/06/2015 22:00   Ct Lumbar Spine Wo Contrast  10/07/2015  CLINICAL DATA:  Chronic low back pain. History of fall 3 weeks ago. No known injury. Initial encounter. EXAM: CT LUMBAR SPINE WITHOUT CONTRAST TECHNIQUE: Multidetector CT imaging of the lumbar spine was performed without intravenous contrast administration. Multiplanar CT image reconstructions were also generated. COMPARISON:  CT chest, abdomen and pelvis 05/14/2011. FINDINGS: Vertebral body height and alignment are maintained. No lytic or sclerotic bony lesion is identified. No pars interarticularis defect is seen. Advanced degenerative  change is present about the sacroiliac joints. A subtle cortical lucency in the anterior aspect of the left sacrum on images 117-121 of series 8 and 96-99 of series 5 is worrisome for insufficiency fracture. No other evidence fracture is identified. Imaged intra-abdominal contents demonstrate aortoiliac atherosclerosis. Bilateral renal cysts are partially visualized. The patient is status post cholecystectomy. T10-11: Small calcified disc bulge without central canal or  foraminal stenosis. T11-12: Loss of disc space height and facet degenerative change. The central canal appears open. Bilateral foraminal narrowing is seen. T12-L1: Broad-based disc bulge with endplate spur. The central canal and foramina appear open. L1-2: Shallow broad-based central protrusion with calcification of the annulus. The central canal and foramina appear open. L2-3: Mild disc bulge and endplate spur without central canal or foraminal stenosis. L3-4: There is some ligamentum flavum thickening and a broad-based disc bulge with endplate spur. Mild central canal narrowing is seen. There is also some bilateral foraminal narrowing. L4-5: Disc bulge, endplate spur and facet arthropathy are seen. There is mild to moderate central canal narrowing. Marked bilateral foraminal narrowing is also seen. L5-S1: There is facet degenerative change and a shallow broad-based disc bulge. The central canal and right foramen are open. Mild to moderate left foraminal narrowing is identified. IMPRESSION: Findings highly suspicious for a nondisplaced left sacral fracture. Negative for lumbar spine fracture. Multilevel degenerative disc disease as detailed above. Electronically Signed   By: Inge Rise M.D.   On: 10/07/2015 16:24   Ir Ivc Filter Plmt / S&i /img Guid/mod Sed  10/11/2015  INDICATION: 82 year old female on anticoagulation for DVT. Sustained a right rectus sheath hematoma requiring cessation of anticoagulation. This is an indication for IVC filter placement for PE prophylaxis. EXAM: ULTRASOUND GUIDANCE FOR VASCULARACCESS IVC CATHETERIZATION AND VENOGRAM IVC FILTER INSERTION Interventional Radiologist:  Criselda Peaches, MD MEDICATIONS: None. ANESTHESIA/SEDATION: Fentanyl 50 mcg IV; Versed 1 mg IV Moderate Sedation Time:  10 minutes The patient was continuously monitored during the procedure by the interventional radiology nurse under my direct supervision. FLUOROSCOPY TIME:  Fluoroscopy Time: 0 minutes 24  seconds (45 mGy). COMPLICATIONS: None immediate. Estimated blood loss: None PROCEDURE: Informed written consent was obtained from the patient after a thorough discussion of the procedural risks, benefits and alternatives. All questions were addressed. Maximal Sterile Barrier Technique was utilized including caps, mask, sterile gowns, sterile gloves, sterile drape, hand hygiene and skin antiseptic. A timeout was performed prior to the initiation of the procedure. Maximal barrier sterile technique utilized including caps, mask, sterile gowns, sterile gloves, large sterile drape, hand hygiene, and Betadine prep. Under sterile condition and local anesthesia, right internal jugular venous access was performed with ultrasound. An ultrasound image was saved and sent to PACS. Over a guidewire, the IVC filter delivery sheath and inner dilator were advanced into the IVC just above the IVC bifurcation. Contrast injection was performed for an IVC venogram using carbon dioxide gas. Through the delivery sheath, a retrievable Denali IVC filter was deployed below the level of the renal veins and above the IVC bifurcation. Limited post deployment venacavagram was performed. The delivery sheath was removed and hemostasis was obtained with manual compression. A dressing was placed. The patient tolerated the procedure well without immediate post procedural complication. FINDINGS: The IVC is patent. No evidence of thrombus, stenosis, or occlusion. No variant venous anatomy. Successful placement of the IVC filter below the level of the renal veins. IMPRESSION: Successful ultrasound and fluoroscopically guided placement of an infrarenal retrievable IVC filter via right jugular approach.  Given patient age and limited ability to ambulate this IVC filter should be considered a permanent device. No dedicated temple we made at follow-up for filter retrieval. However, if the patient's clinical status were to change in the future the filter is  potentially retrievable. Signed, Criselda Peaches, MD [facility] Radiology Electronically Signed   By: Jacqulynn Cadet M.D.   On: 10/11/2015 11:48   Dg Abd 2 Views  10/09/2015  CLINICAL DATA:  Abdominal pain.  Nausea and diarrhea for 1 day. EXAM: ABDOMEN - 2 VIEW COMPARISON:  CT of 01/23/2015 FINDINGS: Upright and right-sided decubitus views. Dual lead pacer. No free intraperitoneal air. No significant air fluid levels. Bilateral hip arthroplasty. No bowel distension. Cholecystectomy. No abnormal abdominal calcifications. No appendicolith. Vascular calcifications. Cardiomegaly. Right hemidiaphragm elevation. IMPRESSION: No acute findings. Electronically Signed   By: Abigail Miyamoto M.D.   On: 10/09/2015 18:13   Ir Vertebroplasty Lumbar Bx Inc Uni/bil Inc Inject/imaging  10/13/2015  INDICATION: Severe sacral pain.  Sacral insufficiency fractures. EXAM: IR VERTEBROPLASTY SACRAL 1 VERTEBROPLASTY MEDICATIONS: As antibiotic prophylaxis, 2 g Ancef IV was ordered pre-procedure and administered intravenously within 1 hour of incision. ANESTHESIA/SEDATION: Moderate (conscious) sedation was employed during this procedure. A total of Versed 1 mg and Fentanyl 25 mcg was administered intravenously. Moderate Sedation Time: 25 minutes. The patient's level of consciousness and vital signs were monitored continuously by radiology nursing throughout the procedure under my direct supervision. FLUOROSCOPY TIME:  Fluoroscopy Time: 10 minutes 12 seconds (123XX123 mGy) COMPLICATIONS: None immediate. TECHNIQUE: Informed written consent was obtained from the patient after a thorough discussion of the procedural risks, benefits and alternatives. All questions were addressed. Maximal Sterile Barrier Technique was utilized including caps, mask, sterile gowns, sterile gloves, sterile drape, hand hygiene and skin antiseptic. A timeout was performed prior to the initiation of the procedure.  PROCEDURE: The patient was placed prone on the fluoroscopic table. Nasal oxygen was administered. Physiologic monitoring was performed throughout the duration of the procedure. The skin overlying the sacral region was prepped and draped in the usual sterile fashion. The skin entry sites were identified and infiltrated with 0.25% bupivacaine and carried to the S1 vertebral bodies. This was then followed by the advancement of a 13-gauge Cook needles into the anterior 1/3 at S1 bilaterally. Gentle contrast injection demonstrated a trabecular pattern contrast with early opacification into the soft tissues. This prompted the use of Gel-Foam pledgets through the 13 gauge Cook spinal needles prior to the delivery of the methylmethacrylate mixture. At this time, methylmethacrylate mixture was reconstituted. Under biplane intermittent fluoroscopy, the methylmethacrylate was then injected into the S1 vertebral body with filling of the vertebral body. No extravasation was noted into the disk spaces or into the presacral space, or into the parapelvic veins. The needle was then removed. Hemostasis was achieved at the skin entry sites. There were no acute complications. The patient tolerated the procedure well. The patient was then returned to her floor in stable condition. IMPRESSION: Status post vertebral body augmentation for painful compression fractures at S1 using vertebroplasty technique. Electronically Signed   By: Luanne Bras M.D.   On: 10/12/2015 15:32    Lab Data:  CBC:  Recent Labs Lab 10/06/15 2116 10/08/15 0512 10/09/15 0546 10/10/15 0555  10/10/15 1433 10/11/15 0035 10/11/15 0807 10/12/15 1210 10/13/15 0520  WBC 10.5 11.0* 17.2* 19.5*  --   --   --   --  11.8* 12.9*  NEUTROABS 8.3*  --   --   --   --   --   --   --  9.2*  --   HGB 11.3* 10.4* 9.7* 7.8*  < > 8.0* 8.8* 8.3* 8.2* 8.0*  HCT 35.5* 32.9* 30.5* 23.2*  < > 24.8* 26.8* 24.9* 24.8* 24.5*  MCV 110.6* 111.9* 112.5* 108.9*  --   --    --   --  100.8* 102.1*  PLT 190 146* 148* 162  --   --   --   --  149* 153  < > = values in this interval not displayed. Basic Metabolic Panel:  Recent Labs Lab 10/07/15 1435 10/08/15 0512 10/09/15 0546 10/10/15 0555 10/12/15 0545 10/13/15 0520  NA 141 142 142 139 136 137  K 4.3 3.6 3.3* 3.4* 3.3* 4.3  CL 105 105 105 101 103 105  CO2 28 30 27 26 25 24   GLUCOSE 150* 95 88 100* 86 98  BUN 28* 22* 21* 27* 26* 21*  CREATININE 1.44* 1.30* 1.36* 1.66* 1.24* 1.04*  CALCIUM 8.7* 8.6* 8.4* 8.3* 8.3* 8.5*  MG 1.9  --   --   --   --   --    GFR: Estimated Creatinine Clearance: 52.3 mL/min (by C-G formula based on Cr of 1.04). Liver Function Tests:  Recent Labs Lab 10/06/15 2116 10/07/15 1435  AST 27 18  ALT 28 24  ALKPHOS 181* 169*  BILITOT 0.9 1.0  PROT 6.2* 5.6*  ALBUMIN 2.8* 2.5*   No results for input(s): LIPASE, AMYLASE in the last 168 hours. No results for input(s): AMMONIA in the last 168 hours. Coagulation Profile:  Recent Labs Lab 10/08/15 1154  INR 1.27   Cardiac Enzymes: No results for input(s): CKTOTAL, CKMB, CKMBINDEX, TROPONINI in the last 168 hours. BNP (last 3 results)  Recent Labs  03/18/15 1643  PROBNP 288.0*   HbA1C: No results for input(s): HGBA1C in the last 72 hours. CBG: No results for input(s): GLUCAP in the last 168 hours. Lipid Profile: No results for input(s): CHOL, HDL, LDLCALC, TRIG, CHOLHDL, LDLDIRECT in the last 72 hours. Thyroid Function Tests: No results for input(s): TSH, T4TOTAL, FREET4, T3FREE, THYROIDAB in the last 72 hours. Anemia Panel: No results for input(s): VITAMINB12, FOLATE, FERRITIN, TIBC, IRON, RETICCTPCT in the last 72 hours. Urine analysis:    Component Value Date/Time   COLORURINE YELLOW 03/18/2015 1643   APPEARANCEUR CLEAR 03/18/2015 1643   LABSPEC 1.010 03/18/2015 1643   PHURINE 6.0 03/18/2015 1643   GLUCOSEU NEGATIVE 03/18/2015 1643   GLUCOSEU NEGATIVE 01/23/2015 1606   HGBUR NEGATIVE 03/18/2015 1643    HGBUR large 01/26/2010 1104   BILIRUBINUR NEGATIVE 03/18/2015 [place] 03/18/2015 1643   PROTEINUR NEGATIVE 01/23/2015 1606   UROBILINOGEN 0.2 03/18/2015 1643   NITRITE NEGATIVE 03/18/2015 [place] 03/18/2015 Ages M.D. [facility] 10/13/2015, 1:41 PM  Pager: [phone] Between 7am to 7pm - call Pager - [phone]  After 7pm go to [email or web address] - password [email or web address]  Call night coverage person covering after 7pm

## 2015-10-13 NOTE — Progress Notes (Signed)
Referring Physician(s): Dr Estill Cotta  Supervising Physician: Luanne Bras  Patient Status: In-pt  Chief Complaint:  Sacroplasty 6/12   Subjective:  Although pt denies she is better She was able to roll over for me to evaluate back---without help Palpated area---without pain Slept better   Allergies: Sulfa antibiotics and Tramadol hcl  Medications: Prior to Admission medications   Medication Sig Start Date End Date Taking? Authorizing Provider  apixaban (ELIQUIS) 2.5 MG TABS tablet Take 1 tablet (2.5 mg total) by mouth 2 (two) times daily. 02/04/15  Yes Charlynne Cousins, MD  calcium citrate-vitamin D (CITRACAL+D) 315-200 MG-UNIT tablet Take 1 tablet by mouth 2 (two) times daily. Patient taking differently: Take 1 tablet by mouth 2 (two) times daily. Gummies 08/24/15  Yes [name], MD  cephALEXin (KEFLEX) 500 MG capsule Take 1 capsule (500 mg total) by mouth 2 (two) times daily. 10/05/15  Yes Lucretia Kern, DO  cholecalciferol (VITAMIN D) 1000 UNITS tablet Take 1,000 Units by mouth daily.   Yes Historical Provider, MD  citalopram (CELEXA) 20 MG tablet Take 20 mg by mouth daily.   Yes Historical Provider, MD  conjugated estrogens (PREMARIN) vaginal cream Place 1 Applicatorful vaginally 3 (three) times a week.   Yes Historical Provider, MD  fentaNYL (DURAGESIC - DOSED MCG/HR) 75 MCG/HR Place 1 patch (75 mcg total) onto the skin every 3 (three) days. 09/14/15  Yes [name], MD  folic acid (FOLVITE) A999333 MCG tablet Take 400 mcg by mouth every morning.   Yes Historical Provider, MD  furosemide (LASIX) 20 MG tablet Take 2 tablets (40 mg total) by mouth daily. Until directed to decrease back to 20 mg daily 03/18/15  Yes [name], MD  gabapentin (NEURONTIN) 400 MG capsule Take 2 capsules (800 mg total) by mouth at bedtime. 12/02/14  Yes Rowe Clack, MD  HYDROcodone-acetaminophen (NORCO/VICODIN) 5-325 MG tablet Take 1 tablet by mouth every 6 (six) hours as  needed for moderate pain.   Yes Historical Provider, MD  isosorbide mononitrate (IMDUR) 30 MG 24 hr tablet Take 15 mg by mouth daily.   Yes Historical Provider, MD  levothyroxine (SYNTHROID, LEVOTHROID) 50 MCG tablet Take 1 tablet (50 mcg total) by mouth daily. Patient taking differently: Take 50 mcg by mouth daily before breakfast.  06/09/14  Yes Rowe Clack, MD  magnesium oxide (MAG-OX) 400 MG tablet Take 400 mg by mouth daily.   Yes Historical Provider, MD  metoprolol succinate (TOPROL-XL) 50 MG 24 hr tablet Take 1 tablet (50 mg total) by mouth 2 (two) times daily. Take with or immediately following a meal. 06/02/14  Yes Rowe Clack, MD  Multiple Vitamin (DAILY-VITE) TABS TAKE ONE TABLET BY MOUTH ONCE DAILY. 07/17/15  Yes [name], MD  nitroGLYCERIN (NITROSTAT) 0.4 MG SL tablet Place 0.4 mg under the tongue every 5 (five) minutes x 3 doses as needed for chest pain. 03/20/12  Yes Roger A Arguello, PA-C  nystatin (MYCOSTATIN) 100000 UNIT/ML suspension Take 5 mLs (500,000 Units total) by mouth 4 (four) times daily. Use for two days after symptoms have resolved and then stop 09/21/15  Yes [name], MD  pantoprazole (PROTONIX) 40 MG tablet Take 1 tablet (40 mg total) by mouth 2 (two) times daily. 08/24/15  Yes [name], MD  potassium chloride SA (K-DUR,KLOR-CON) 20 MEQ tablet TAKE ONE TABLET BY MOUTH ONCE DAILY. 06/26/15  Yes [name], MD  predniSONE (DELTASONE) 5 MG tablet Take  5 mg by mouth daily with breakfast.   Yes Historical Provider, MD  sucralfate (CARAFATE) 1 GM/10ML suspension Take 1 g by mouth 2 (two) times daily.   Yes Historical Provider, MD  vitamin B-12 (CYANOCOBALAMIN) 500 MCG tablet TAKE ONE TABLET BY MOUTH ONCE DAILY. 11/20/13  Yes Rowe Clack, MD     Vital Signs: BP 108/56 mmHg  Pulse 76  Temp(Src) 98.1 F (36.7 C) (Oral)  Resp 20  Ht 5\' 4"  (1.626 m)  Wt 337 lb 11.9 oz (153.2 kg)  BMI 57.95 kg/m2  SpO2 99%  Physical Exam  Constitutional:  She appears well-nourished.  Abdominal: Soft.  Musculoskeletal: Normal range of motion. She exhibits no tenderness.  Moves all 4s Able to roll by self without pain Palpated area of SP---no pain Site clean and dry   Neurological: She is alert.  Nursing note and vitals reviewed.   Imaging: Ct Abdomen Pelvis Wo Contrast  10/10/2015  CLINICAL DATA:  82 year old female with a history of fall and right-sided abdominal pain. EXAM: CT ABDOMEN AND PELVIS WITHOUT CONTRAST TECHNIQUE: Multidetector CT imaging of the abdomen and pelvis was performed following the standard protocol without IV contrast. COMPARISON:  Prior CT 01/23/2015 FINDINGS: Lower chest: Unremarkable appearance of the soft tissues of the chest wall. Heart size within normal limits.  No pericardial fluid/thickening. Pacing leads partially visualized. Calcifications of aortic valve and coronary calcifications. No lower mediastinal adenopathy. Fluid filled thoracic esophagus.  Small hiatal hernia. No confluent airspace disease, pleural fluid, or pneumothorax within visualized lung. Atelectasis/scarring at the lung bases. Abdomen/pelvis: Unremarkable appearance of liver and spleen. Unremarkable appearance of bilateral adrenal glands. No peripancreatic or pericholecystic fluid or inflammatory changes. Cholecystectomy. No intrahepatic or extrahepatic biliary ductal dilatation. Punctate gas within left-sided biliary ducts. No intra-peritoneal free air or significant free-fluid. No abnormally dilated small bowel or colon. No transition point. No inflammatory changes of the mesenteries. Enteric contrast reaches the distal colon. Colonic diverticula without evidence of associated inflammatory changes. Normal appendix. Sagittal images demonstrate rectocele. Right Kidney/Ureter: No hydronephrosis. No nephrolithiasis. No perinephric stranding. Unremarkable course of the right ureter. Low-density cystic structure of the lateral right cortex, compatible with  benign cyst. Left Kidney/Ureter: No hydronephrosis. No nephrolithiasis. No perinephric stranding. Unremarkable course of the left ureter. Low-density cystic structure of the inferior left cortex, compatible with benign cyst. Unremarkable appearance of the urinary bladder. Hyperdense hematoma within the right abdominal wall musculature, with the component in the rectus musculature measuring 5.3 cm x 6.7 cm. Second more superior component within the oblique musculature on the right measuring 4.0 cm by 9.9 cm. Scattered vascular calcifications of the abdominal aorta. Calcifications of the mesenteric vasculature. Musculoskeletal: Surgical changes of prior bilateral hip arthroplasty. Multilevel degenerative changes of the visualized spine. Acute angulation of the sacrum at the third sacral element. Diffuse osteopenia of the lumbar vertebral levels. Vertebral body heights maintained. IMPRESSION: Acute extraperitoneal hemorrhage involving the right abdominal wall musculature with component of the rectus muscle measuring 6.7 cm and component within the oblique musculature measuring as great as 9.9 cm. Acute angulation at the third sacral element, compatible with fracture. Pelvic floor insufficiency with rectocele. Atherosclerosis and coronary artery disease. These results were called by telephone at the time of interpretation on 10/10/2015 at 1:09 pm to the nurse caring for the patient, Ms Alphonsus Sias who verbally acknowledged these results. Signed, Dulcy Fanny. Earleen Newport, DO [facility] Radiology Electronically Signed   By: Corrie Mckusick D.O.   On: 10/10/2015  13:11   Ir Ivc Filter Plmt / S&i /img Guid/mod Sed  10/11/2015  INDICATION: 82 year old female on anticoagulation for DVT. Sustained a right rectus sheath hematoma requiring cessation of anticoagulation. This is an indication for IVC filter placement for PE prophylaxis. EXAM: ULTRASOUND GUIDANCE FOR VASCULARACCESS IVC  CATHETERIZATION AND VENOGRAM IVC FILTER INSERTION Interventional Radiologist:  Criselda Peaches, MD MEDICATIONS: None. ANESTHESIA/SEDATION: Fentanyl 50 mcg IV; Versed 1 mg IV Moderate Sedation Time:  10 minutes The patient was continuously monitored during the procedure by the interventional radiology nurse under my direct supervision. FLUOROSCOPY TIME:  Fluoroscopy Time: 0 minutes 24 seconds (45 mGy). COMPLICATIONS: None immediate. Estimated blood loss: None PROCEDURE: Informed written consent was obtained from the patient after a thorough discussion of the procedural risks, benefits and alternatives. All questions were addressed. Maximal Sterile Barrier Technique was utilized including caps, mask, sterile gowns, sterile gloves, sterile drape, hand hygiene and skin antiseptic. A timeout was performed prior to the initiation of the procedure. Maximal barrier sterile technique utilized including caps, mask, sterile gowns, sterile gloves, large sterile drape, hand hygiene, and Betadine prep. Under sterile condition and local anesthesia, right internal jugular venous access was performed with ultrasound. An ultrasound image was saved and sent to PACS. Over a guidewire, the IVC filter delivery sheath and inner dilator were advanced into the IVC just above the IVC bifurcation. Contrast injection was performed for an IVC venogram using carbon dioxide gas. Through the delivery sheath, a retrievable Denali IVC filter was deployed below the level of the renal veins and above the IVC bifurcation. Limited post deployment venacavagram was performed. The delivery sheath was removed and hemostasis was obtained with manual compression. A dressing was placed. The patient tolerated the procedure well without immediate post procedural complication. FINDINGS: The IVC is patent. No evidence of thrombus, stenosis, or occlusion. No variant venous anatomy. Successful placement of the IVC filter below the level of the renal veins.  IMPRESSION: Successful ultrasound and fluoroscopically guided placement of an infrarenal retrievable IVC filter via right jugular approach. Given patient age and limited ability to ambulate this IVC filter should be considered a permanent device. No dedicated temple we made at follow-up for filter retrieval. However, if the patient's clinical status were to change in the future the filter is potentially retrievable. Signed, Criselda Peaches, MD [facility] Radiology Electronically Signed   By: Jacqulynn Cadet M.D.   On: 10/11/2015 11:48   Dg Abd 2 Views  10/09/2015  CLINICAL DATA:  Abdominal pain.  Nausea and diarrhea for 1 day. EXAM: ABDOMEN - 2 VIEW COMPARISON:  CT of 01/23/2015 FINDINGS: Upright and right-sided decubitus views. Dual lead pacer. No free intraperitoneal air. No significant air fluid levels. Bilateral hip arthroplasty. No bowel distension. Cholecystectomy. No abnormal abdominal calcifications. No appendicolith. Vascular calcifications. Cardiomegaly. Right hemidiaphragm elevation. IMPRESSION: No acute findings. Electronically Signed   By: Abigail Miyamoto M.D.   On: 10/09/2015 18:13    Labs:  CBC:  Recent Labs  10/09/15 0546 10/10/15 0555  10/11/15 0035 10/11/15 0807 10/12/15 1210 10/13/15 0520  WBC 17.2* 19.5*  --   --   --  11.8* 12.9*  HGB 9.7* 7.8*  < > 8.8* 8.3* 8.2* 8.0*  HCT 30.5* 23.2*  < > 26.8* 24.9* 24.8* 24.5*  PLT 148* 162  --   --   --  149* 153  < > = values in this interval not displayed.  COAGS:  Recent Labs  10/08/15 1154 10/10/15  [id number] 10/10/15 0923  INR 1.27  --   --   APTT 29 95* 126*    BMP:  Recent Labs  10/09/15 0546 10/10/15 0555 10/12/15 0545 10/13/15 0520  NA 142 139 136 137  K 3.3* 3.4* 3.3* 4.3  CL 105 101 103 105  CO2 27 26 25 24   GLUCOSE 88 100* 86 98  BUN 21* 27* 26* 21*  CALCIUM 8.4* 8.3* 8.3* 8.5*  CREATININE 1.36* 1.66* 1.24* 1.04*  GFRNONAA 33* 26* 37* 46*  GFRAA 38*  30* 43* 53*    LIVER FUNCTION TESTS:  Recent Labs  03/18/15 1643 06/08/15 1418 10/06/15 2116 10/07/15 1435  BILITOT 0.5 0.9 0.9 1.0  AST 21 18 27 18   ALT 19 24 28 24   ALKPHOS 76 54 181* 169*  PROT 6.6 6.2 6.2* 5.6*  ALBUMIN 3.8 3.6 2.8* 2.5*    Assessment and Plan:  Sacroplasty 6/12 Doing well Pt will hear from scheduler for 2 week follow up  Electronically Signed: Brandom Kerwin A 10/13/2015, 7:54 AM   I spent a total of 15 Minutes at the the patient's bedside AND on the patient's hospital floor or unit, greater than 50% of which was counseling/coordinating care for Sacroplasty

## 2015-10-13 NOTE — Clinical Documentation Improvement (Signed)
Hospitalist  Abnormal Lab/Test Results:  CBC Component     Latest Ref Rng 10/06/2015 10/08/2015 10/09/2015 10/10/2015 10/10/2015            5:55 AM  9:23 AM  Hemoglobin     12.0 - 15.0 g/dL 11.3 (L) 10.4 (L) 9.7 (L) 7.8 (L) 7.8 (L)  HCT     36.0 - 46.0 % 35.5 (L) 32.9 (L) 30.5 (L) 23.2 (L) 24.7 (L)   Component     Latest Ref Rng 10/10/2015 10/11/2015 10/11/2015 10/12/2015 10/13/2015         2:33 PM 12:35 AM  8:07 AM    Hemoglobin     12.0 - 15.0 g/dL 8.0 (L) 8.8 (L) 8.3 (L) 8.2 (L) 8.0 (L)  HCT     36.0 - 46.0 % 24.8 (L) 26.8 (L) 24.9 (L) 24.8 (L) 24.5 (L)    Possible Clinical Conditions associated with below indicators   Acute blood loss anemia  Other Condition  Cannot Clinically Determine   Supporting Information:  6/10 progress note:  Lower Abdominal pain, diarrhea - Abdominal x-ray 6/9 was negative for any SBO or perforation - Patient's son at the bedside feels her abdomen feels like "garden hose", patient is not complaining of significant abdominal pain at this time. However on her lab exams, patient has worsening leukocytosis, hemoglobin down to 7.8 - Repeat H&H 7.8, obtain serial H&H today, stat CT abdomen and pelvis without contrast to rule out any retroperitoneal hematoma or infectious pathology. Obtain blood cultures, pro-calcitonin - C. difficile negative Addendum: 1:20pm Stat CT results: Acute extraperitoneal hemorrhage involving the right abdominal wall musculature with component of the rectus muscle measuring 6.7 cm and component within the oblique musculature measuring as great as 9.9 cm. - DC'ed heparin drip (called RN) - transfuse 1 unit packed RBC's now - serial H/H - Discussed with on call surgery Dr Donne Hazel who advised conservative management as above and abdominal binder, should resolved spontaneously. - Explained to patient's son in detail and answered all questions and concerns. - Will place IR consult for IVC filter as no anticoagulation for DVT due to  large rectus sheath hematoma   6/11 progress note: Rectus sheath abd hematoma - Abdominal x-ray 6/9 was negative for any SBO or perforation - CT abd : Acute extraperitoneal hemorrhage/rectus sheath hematoma 6.7x 9.9cm - Conservative management per surgery, discussed with on call surgery Dr Donne Hazel on 6/10 - Continue abdominal binder, heparin drip discontinued on 6/10 - Transfused 1 unit, H&H stable   Please exercise your independent, professional judgment when responding. A specific answer is not anticipated or expected.   Thank You,  [place] [phone]

## 2015-10-13 NOTE — Care Management Important Message (Signed)
Important Message  Patient Details  Name: Nicole Roberts MRN: [id number] Date of Birth: 01-Feb-1922   Medicare Important Message Given:  Yes    Loann Quill 10/13/2015, 8:53 AM

## 2015-10-13 NOTE — Progress Notes (Signed)
CSW spoke with patient and son at bedside. She is requesting to go to SNF for short term rehab. Prefers [facility].  Nicole Roberts LCSWA [phone]

## 2015-10-14 ENCOUNTER — Inpatient Hospital Stay (HOSPITAL_COMMUNITY): Payer: Medicare Other

## 2015-10-14 LAB — CBC
HCT: 24.5 % — ABNORMAL LOW (ref 36.0–46.0)
Hemoglobin: 7.8 g/dL — ABNORMAL LOW (ref 12.0–15.0)
MCH: 33.6 pg (ref 26.0–34.0)
MCHC: 31.8 g/dL (ref 30.0–36.0)
MCV: 105.6 fL — ABNORMAL HIGH (ref 78.0–100.0)
PLATELETS: 150 10*3/uL (ref 150–400)
RBC: 2.32 MIL/uL — ABNORMAL LOW (ref 3.87–5.11)
RDW: 20.1 % — AB (ref 11.5–15.5)
WBC: 13.2 10*3/uL — ABNORMAL HIGH (ref 4.0–10.5)

## 2015-10-14 LAB — BASIC METABOLIC PANEL
Anion gap: 5 (ref 5–15)
BUN: 19 mg/dL (ref 6–20)
CALCIUM: 8.7 mg/dL — AB (ref 8.9–10.3)
CO2: 26 mmol/L (ref 22–32)
CREATININE: 0.97 mg/dL (ref 0.44–1.00)
Chloride: 106 mmol/L (ref 101–111)
GFR calc non Af Amer: 50 mL/min — ABNORMAL LOW (ref >60)
GFR, EST AFRICAN AMERICAN: 57 mL/min — AB (ref >60)
Glucose, Bld: 90 mg/dL (ref 65–99)
Potassium: 4.3 mmol/L (ref 3.5–5.1)
SODIUM: 137 mmol/L (ref 135–145)

## 2015-10-14 LAB — PROCALCITONIN

## 2015-10-14 MED ORDER — AMOXICILLIN-POT CLAVULANATE 875-125 MG PO TABS
1.0000 | ORAL_TABLET | Freq: Two times a day (BID) | ORAL | Status: DC
  Administered 2015-10-14 – 2015-10-15 (×3): 1 via ORAL
  Filled 2015-10-14 (×3): qty 1

## 2015-10-14 MED ORDER — ACETAMINOPHEN 500 MG PO TABS
1000.0000 mg | ORAL_TABLET | Freq: Three times a day (TID) | ORAL | Status: DC
  Administered 2015-10-14: 1000 mg via ORAL
  Administered 2015-10-14: 500 mg via ORAL
  Administered 2015-10-15: 1000 mg via ORAL
  Filled 2015-10-14 (×4): qty 2

## 2015-10-14 MED ORDER — OXYCODONE HCL 5 MG PO TABS
5.0000 mg | ORAL_TABLET | Freq: Four times a day (QID) | ORAL | Status: DC | PRN
  Administered 2015-10-15: 5 mg via ORAL
  Filled 2015-10-14: qty 1

## 2015-10-14 NOTE — Progress Notes (Addendum)
Son asked RN this morning to have MD call him with updates regarding plan of care. Dr. Tana Coast aware and stated she called son's mobile phone twice with no answer. Dr. Tana Coast updated RN on plan of care to update son when he comes back to the floor.

## 2015-10-14 NOTE — Progress Notes (Signed)
[facility]                                                                              Patient Demographics  Nicole Roberts, is a 82 y.o. female, DOB - [birth year], [id number]  Admit date - 10/06/2015   Admitting Physician Phillips Grout, MD  Outpatient Primary MD for the patient is [name], MD  Outpatient specialists:   LOS - 8  days    Chief Complaint  Patient presents with  . Leg Pain  . Cellulitis       Brief summary   Patient is a 82 year old from [facility] with history of atrial fibrillation, hypertension and hyperlipidemia, GERD, hypothyroidism, anemia, TIA presented to ED with cellulitis, leg pain. Patient apparently fell and hit her leg over 3 weeks ago and developed a skin tear. About a week prior to admission he started to get more red and erythematous. She was started on Keflex 2 days prior to admission. The redness worsened despite antibiotics over to the anterior leg up to the knee. No fevers. Patient was admitted for cellulitis.   Assessment & Plan    Principal Problem:   Cellulitis- - Outpatient failure to Keflex - Patient was placed on IV vancomycin and Zosyn, Transitioned to Augmentin - Wound care also consulted  Active Problems: Acute on chronic back pain, recent fall, radiating to legs Likely due to sacral fracture - CT of the lumbar spine showed left sacral fracture - IR was consulted for kyphoplasty, done on 6/12 -Patient much more alert and awake and oriented 3, conversing, continue decreased Norco to every 8 hours PRN, scheduled Tylenol, fentanyl patch. - Patient was on fentanyl patch prior to admission, will continue  Right leg pain  due to right leg DVT - Doppler ultrasound of LE positive for DVT in the right posterior tibial and peroneal vein.  - Patient was on eliquis with renal dosing prior to admission, and, then placed on IV heparin drip now discontinued due to rectus sheath hematoma    - IVC filter placed on 6/11  Rectus sheath abd hematoma/Acute blood loss anemia - Abdominal x-ray 6/9 was negative for any SBO or perforation - CT abd : Acute extraperitoneal hemorrhage/rectus sheath hematoma 6.7x 9.9cm - Continue conservative management, appreciate surgery recommendations. Will restart eliquis in 1 week, if H&H stable and no expanding hematoma. I had also discussed in detail with patient's son after the surgery recommendations. He is agreeable with the plan.  - Hemoglobin down to 7.8, patient felt abd pain last night, with repeat CT abdomen today. Continue binder      Atrial fibrillation (HCC) - Currently rate controlled currently not on any anticoagulation due to abdominal wall hematoma - Per patient's son, he feels the stroke risk is high if she is off AC and wanted to restart eliquis and have surgical option for rectus sheath hematoma. I appreciate cardiology recommendations, recommended restart eliquis 2.5 mg twice a day when okay with surgery - Discussed with patient's son, general consensus is to hold AC 7-14 days for rectus sheath hematoma and restart if hematoma is not expanding and hemoglobin  is stable.    Essential hypertension - Currently controlled - Continue furosemide, Imdur, metoprolol    Rheumatoid arthritis (HCC) - On chronic steroids    Mild acute on chronic Stage III chronic kidney disease - Baseline creatinine 1.3-1.4, currently at baseline    Chronic combined systolic and diastolic CHF (congestive heart failure) (HCC) -Currently compensated   Macrocytic anemia B12, folate within normal limits, check stool occult test   Code Status: dnr  DVT Prophylaxis: apixaban on hold, heparin drip on hold. DVT in right LE   Family Communication: Discussed in detail with the patient, all imaging results, lab results explained to the patient. I called patient's son on his cell twice, left message. Discussed the plan in detail with RN.   Disposition  Plan: Skilled nursing facility when stable   Time Spent in minutes   25 minutes  Procedures:  CT lumbar spine CT abdomen Doppler ultrasound of the lower extremities  Consultants:   Interventional radiology Cardiology Surgery   Antimicrobials:   IV vancomycin 6/6-> 6/11  IV Zosyn 6/7 > 6/11  Augmentin 6/12, 6/13   Medications  Scheduled Meds: . calcium-vitamin D  1 tablet Oral BID  . cholecalciferol  1,000 Units Oral Daily  . citalopram  20 mg Oral Daily  . conjugated estrogens  1 Applicatorful Vaginal Once per day on Mon Wed Fri  . fentaNYL  75 mcg Transdermal Q72H  . folic acid  1 mg Oral q morning - 10a  . gabapentin  800 mg Oral QHS  . isosorbide mononitrate  15 mg Oral Daily  . levothyroxine  50 mcg Oral QAC breakfast  . magnesium oxide  400 mg Oral Daily  . metoprolol succinate  50 mg Oral BID  . multivitamin  1 tablet Oral Daily  . nystatin  5 mL Oral QID  . pantoprazole  40 mg Oral BID  . potassium chloride SA  20 mEq Oral Daily  . predniSONE  5 mg Oral Q breakfast  . sodium chloride flush  3 mL Intravenous Q12H  . sucralfate  1 g Oral BID  . vitamin B-12  500 mcg Oral Daily   Continuous Infusions:   PRN Meds:.sodium chloride, HYDROcodone-acetaminophen, loperamide, nitroGLYCERIN, ondansetron (ZOFRAN) IV, sodium chloride flush   Antibiotics   Anti-infectives    Start     Dose/Rate Route Frequency Ordered Stop   10/12/15 1339  ceFAZolin (ANCEF) 2-3 GM-% IVPB SOLR    Comments:  Laughlin, Amy   : cabinet override      10/12/15 1339 10/13/15 0144   10/12/15 1328  tobramycin (NEBCIN) 1.2 g powder    Comments:  Hines, Kristopher   : cabinet override      10/12/15 1328 10/12/15 1346   10/11/15 2200  amoxicillin-clavulanate (AUGMENTIN) 250-125 MG per tablet 1 tablet     1 tablet Oral Every 12 hours 10/11/15 1459 10/12/15 2149   10/07/15 2000  vancomycin (VANCOCIN) 500 mg in sodium chloride 0.9 % 100 mL IVPB  Status:  Discontinued     500 mg 100 mL/hr  over 60 Minutes Intravenous Every 24 hours 10/07/15 0119 10/11/15 1459   10/07/15 0400  piperacillin-tazobactam (ZOSYN) IVPB 3.375 g  Status:  Discontinued     3.375 g 12.5 mL/hr over 240 Minutes Intravenous Every 8 hours 10/07/15 0119 10/11/15 1459   10/06/15 2045  piperacillin-tazobactam (ZOSYN) IVPB 3.375 g     3.375 g 100 mL/hr over 30 Minutes Intravenous  Once 10/06/15 2039 10/07/15 0018   10/06/15 2045  vancomycin (VANCOCIN) IVPB 1000 mg/200 mL premix     1,000 mg 200 mL/hr over 60 Minutes Intravenous  Once 10/06/15 2039 10/07/15 0018        Subjective:   Nicole Roberts was seen and examined today.  No fevers or chills.  Patient denies  chest pain, shortness of breath. No acute events overnight.  Patient was that reported that she had abdominal pain last night. Currently she is feeling okay. No nausea or vomiting. Alert and oriented, very pleasant and conversing.   Objective:   Filed Vitals:   10/13/15 0622 10/13/15 1436 10/13/15 2143 10/14/15 0500  BP: 108/56 120/68 133/67 128/69  Pulse: 76 86 85 82  Temp: 98.1 F (36.7 C) 98.4 F (36.9 C) 97.9 F (36.6 C) 97.9 F (36.6 C)  TempSrc: Oral Oral  Oral  Resp: 20 20 18 16   Height:      Weight:      SpO2: 99% 98% 97% 95%    Intake/Output Summary (Last 24 hours) at 10/14/15 1057 Last data filed at 10/13/15 1912  Gross per 24 hour  Intake    360 ml  Output      0 ml  Net    360 ml     Wt Readings from Last 3 Encounters:  10/13/15 69.4 kg (153 lb)  08/10/15 70.217 kg (154 lb 12.8 oz)  06/08/15 74.844 kg (165 lb)     Exam  General: Alert and oriented 3, very pleasant and conversing today  HEENT:   Neck:   Cardiovascular: S1 S2 clear, RRR  Respiratory: Clear to auscultation bilaterally, no wheezing, rales or rhonchi  Gastrointestinal: Feels a little more firm on removing the binder otherwise no tenderness   Ext: no cyanosis clubbing or edema  Neuro: no new deficits  Skin: Dressing intact on  theleft lower extremity  Psych:alert and oriented and pleasant  Data Reviewed:  I have personally reviewed following labs and imaging studies  Micro Results Recent Results (from the past 240 hour(s))  Blood Culture (routine x 2)     Status: None   Collection Time: 10/06/15  9:16 PM  Result Value Ref Range Status   Specimen Description BLOOD RIGHT ARM  Final   Special Requests BOTTLES DRAWN AEROBIC ONLY 10CC  Final   Culture NO GROWTH 5 DAYS  Final   Report Status 10/11/2015 FINAL  Final  Blood Culture (routine x 2)     Status: Abnormal   Collection Time: 10/06/15  9:38 PM  Result Value Ref Range Status   Specimen Description BLOOD LEFT ANTECUBITAL  Final   Special Requests BOTTLES DRAWN AEROBIC ONLY 5CC  Final   Culture  Setup Time   Final    GRAM POSITIVE COCCI IN CLUSTERS AEROBIC BOTTLE ONLY CRITICAL RESULT CALLED TO, READ BACK BY AND VERIFIED WITH: C [facility] D 1135 [id number] M WILSON    Culture (A)  Final    STAPHYLOCOCCUS SPECIES (COAGULASE NEGATIVE) THE SIGNIFICANCE OF ISOLATING THIS ORGANISM FROM A SINGLE SET OF BLOOD CULTURES WHEN MULTIPLE SETS ARE DRAWN IS UNCERTAIN. PLEASE NOTIFY THE MICROBIOLOGY DEPARTMENT WITHIN ONE WEEK IF SPECIATION AND SENSITIVITIES ARE REQUIRED.    Report Status 10/11/2015 FINAL  Final  Blood Culture ID Panel (Reflexed)     Status: Abnormal   Collection Time: 10/06/15  9:38 PM  Result Value Ref Range Status   Enterococcus species NOT DETECTED NOT DETECTED Final   Vancomycin resistance NOT DETECTED NOT DETECTED Final   Listeria monocytogenes NOT DETECTED NOT DETECTED  Final   Staphylococcus species DETECTED (A) NOT DETECTED Corrected    Comment: CRITICAL RESULT CALLED TO, READ BACK BY AND VERIFIED WITH: CLamont Snowball. D. 11:35 10/08/15 ([email or web address]) CORRECTED ON 06/09 AT 1034: PREVIOUSLY REPORTED AS DETECTED C. Lamont Snowball. D. 11:35 10/08/15 ([email or web address])    Staphylococcus aureus NOT DETECTED NOT DETECTED Final   Methicillin resistance NOT DETECTED  NOT DETECTED Final   Streptococcus species NOT DETECTED NOT DETECTED Final   Streptococcus agalactiae NOT DETECTED NOT DETECTED Final   Streptococcus pneumoniae NOT DETECTED NOT DETECTED Final   Streptococcus pyogenes NOT DETECTED NOT DETECTED Final   Acinetobacter baumannii NOT DETECTED NOT DETECTED Final   Enterobacteriaceae species NOT DETECTED NOT DETECTED Final   Enterobacter cloacae complex NOT DETECTED NOT DETECTED Final   Escherichia coli NOT DETECTED NOT DETECTED Final   Klebsiella oxytoca NOT DETECTED NOT DETECTED Final   Klebsiella pneumoniae NOT DETECTED NOT DETECTED Final   Proteus species NOT DETECTED NOT DETECTED Final   Serratia marcescens NOT DETECTED NOT DETECTED Final   Carbapenem resistance NOT DETECTED NOT DETECTED Final   Haemophilus influenzae NOT DETECTED NOT DETECTED Final   Neisseria meningitidis NOT DETECTED NOT DETECTED Final   Pseudomonas aeruginosa NOT DETECTED NOT DETECTED Final   Candida albicans NOT DETECTED NOT DETECTED Final   Candida glabrata NOT DETECTED NOT DETECTED Final   Candida krusei NOT DETECTED NOT DETECTED Final   Candida parapsilosis NOT DETECTED NOT DETECTED Final   Candida tropicalis NOT DETECTED NOT DETECTED Final  MRSA PCR Screening     Status: None   Collection Time: 10/07/15  1:08 AM  Result Value Ref Range Status   MRSA by PCR NEGATIVE NEGATIVE Final    Comment:        The GeneXpert MRSA Assay (FDA approved for NASAL specimens only), is one component of a comprehensive MRSA colonization surveillance program. It is not intended to diagnose MRSA infection nor to guide or monitor treatment for MRSA infections.   C difficile quick scan w PCR reflex     Status: None   Collection Time: 10/09/15 11:49 AM  Result Value Ref Range Status   C Diff antigen NEGATIVE NEGATIVE Final   C Diff toxin NEGATIVE NEGATIVE Final   C Diff interpretation Negative for toxigenic C. difficile  Final  Culture, blood (routine x 2)     Status: None  (Preliminary result)   Collection Time: 10/10/15  9:30 AM  Result Value Ref Range Status   Specimen Description BLOOD RIGHT ANTECUBITAL  Final   Special Requests BOTTLES DRAWN AEROBIC ONLY 10CC  Final   Culture NO GROWTH 3 DAYS  Final   Report Status PENDING  Incomplete  Culture, blood (routine x 2)     Status: None (Preliminary result)   Collection Time: 10/10/15  9:40 AM  Result Value Ref Range Status   Specimen Description BLOOD BLOOD RIGHT HAND  Final   Special Requests BOTTLES DRAWN AEROBIC ONLY 5CC  Final   Culture NO GROWTH 3 DAYS  Final   Report Status PENDING  Incomplete  Culture, blood (routine x 2)     Status: None (Preliminary result)   Collection Time: 10/10/15  2:25 PM  Result Value Ref Range Status   Specimen Description BLOOD RIGHT ANTECUBITAL  Final   Special Requests BOTTLES DRAWN AEROBIC AND ANAEROBIC 5CC  Final   Culture NO GROWTH 3 DAYS  Final   Report Status PENDING  Incomplete  Culture, blood (routine x 2)     Status: None (  Preliminary result)   Collection Time: 10/10/15  2:35 PM  Result Value Ref Range Status   Specimen Description BLOOD RIGHT ANTECUBITAL  Final   Special Requests BOTTLES DRAWN AEROBIC AND ANAEROBIC 10CC  Final   Culture NO GROWTH 3 DAYS  Final   Report Status PENDING  Incomplete    Radiology Reports Ct Abdomen Pelvis Wo Contrast  10/10/2015  CLINICAL DATA:  82 year old female with a history of fall and right-sided abdominal pain. EXAM: CT ABDOMEN AND PELVIS WITHOUT CONTRAST TECHNIQUE: Multidetector CT imaging of the abdomen and pelvis was performed following the standard protocol without IV contrast. COMPARISON:  Prior CT 01/23/2015 FINDINGS: Lower chest: Unremarkable appearance of the soft tissues of the chest wall. Heart size within normal limits.  No pericardial fluid/thickening. Pacing leads partially visualized. Calcifications of aortic valve and coronary calcifications. No lower mediastinal adenopathy. Fluid filled thoracic esophagus.   Small hiatal hernia. No confluent airspace disease, pleural fluid, or pneumothorax within visualized lung. Atelectasis/scarring at the lung bases. Abdomen/pelvis: Unremarkable appearance of liver and spleen. Unremarkable appearance of bilateral adrenal glands. No peripancreatic or pericholecystic fluid or inflammatory changes. Cholecystectomy. No intrahepatic or extrahepatic biliary ductal dilatation. Punctate gas within left-sided biliary ducts. No intra-peritoneal free air or significant free-fluid. No abnormally dilated small bowel or colon. No transition point. No inflammatory changes of the mesenteries. Enteric contrast reaches the distal colon. Colonic diverticula without evidence of associated inflammatory changes. Normal appendix. Sagittal images demonstrate rectocele. Right Kidney/Ureter: No hydronephrosis. No nephrolithiasis. No perinephric stranding. Unremarkable course of the right ureter. Low-density cystic structure of the lateral right cortex, compatible with benign cyst. Left Kidney/Ureter: No hydronephrosis. No nephrolithiasis. No perinephric stranding. Unremarkable course of the left ureter. Low-density cystic structure of the inferior left cortex, compatible with benign cyst. Unremarkable appearance of the urinary bladder. Hyperdense hematoma within the right abdominal wall musculature, with the component in the rectus musculature measuring 5.3 cm x 6.7 cm. Second more superior component within the oblique musculature on the right measuring 4.0 cm by 9.9 cm. Scattered vascular calcifications of the abdominal aorta. Calcifications of the mesenteric vasculature. Musculoskeletal: Surgical changes of prior bilateral hip arthroplasty. Multilevel degenerative changes of the visualized spine. Acute angulation of the sacrum at the third sacral element. Diffuse osteopenia of the lumbar vertebral levels. Vertebral body heights maintained. IMPRESSION: Acute extraperitoneal hemorrhage involving the right  abdominal wall musculature with component of the rectus muscle measuring 6.7 cm and component within the oblique musculature measuring as great as 9.9 cm. Acute angulation at the third sacral element, compatible with fracture. Pelvic floor insufficiency with rectocele. Atherosclerosis and coronary artery disease. These results were called by telephone at the time of interpretation on 10/10/2015 at 1:09 pm to the nurse caring for the patient, Ms Alphonsus Sias who verbally acknowledged these results. Signed, Dulcy Fanny. Earleen Newport, DO [facility] Electronically Signed   By: Corrie Mckusick D.O.   On: 10/10/2015 13:11   Dg Tibia/fibula Left  10/06/2015  CLINICAL DATA:  Fall 10 days ago. Skin tear with possible infection. Initial encounter. EXAM: LEFT TIBIA AND FIBULA - 2 VIEW COMPARISON:  None. FINDINGS: Soft tissue swelling greatest about the lateral lower leg. There is no soft tissue gas or opaque foreign body. Negative for fracture or signs of osteomyelitis. Osteopenia and knee osteoarthritis. Arterial and soft tissue calcifications. IMPRESSION: Soft tissue swelling without opaque foreign body, soft tissue emphysema, or evidence of osteomyelitis. Electronically Signed   By: Neva Seat.D.  On: 10/06/2015 22:00   Ct Lumbar Spine Wo Contrast  10/07/2015  CLINICAL DATA:  Chronic low back pain. History of fall 3 weeks ago. No known injury. Initial encounter. EXAM: CT LUMBAR SPINE WITHOUT CONTRAST TECHNIQUE: Multidetector CT imaging of the lumbar spine was performed without intravenous contrast administration. Multiplanar CT image reconstructions were also generated. COMPARISON:  CT chest, abdomen and pelvis 05/14/2011. FINDINGS: Vertebral body height and alignment are maintained. No lytic or sclerotic bony lesion is identified. No pars interarticularis defect is seen. Advanced degenerative change is present about the sacroiliac joints. A subtle cortical lucency  in the anterior aspect of the left sacrum on images 117-121 of series 8 and 96-99 of series 5 is worrisome for insufficiency fracture. No other evidence fracture is identified. Imaged intra-abdominal contents demonstrate aortoiliac atherosclerosis. Bilateral renal cysts are partially visualized. The patient is status post cholecystectomy. T10-11: Small calcified disc bulge without central canal or foraminal stenosis. T11-12: Loss of disc space height and facet degenerative change. The central canal appears open. Bilateral foraminal narrowing is seen. T12-L1: Broad-based disc bulge with endplate spur. The central canal and foramina appear open. L1-2: Shallow broad-based central protrusion with calcification of the annulus. The central canal and foramina appear open. L2-3: Mild disc bulge and endplate spur without central canal or foraminal stenosis. L3-4: There is some ligamentum flavum thickening and a broad-based disc bulge with endplate spur. Mild central canal narrowing is seen. There is also some bilateral foraminal narrowing. L4-5: Disc bulge, endplate spur and facet arthropathy are seen. There is mild to moderate central canal narrowing. Marked bilateral foraminal narrowing is also seen. L5-S1: There is facet degenerative change and a shallow broad-based disc bulge. The central canal and right foramen are open. Mild to moderate left foraminal narrowing is identified. IMPRESSION: Findings highly suspicious for a nondisplaced left sacral fracture. Negative for lumbar spine fracture. Multilevel degenerative disc disease as detailed above. Electronically Signed   By: Inge Rise M.D.   On: 10/07/2015 16:24   Ir Ivc Filter Plmt / S&i /img Guid/mod Sed  10/11/2015  INDICATION: 82 year old female on anticoagulation for DVT. Sustained a right rectus sheath hematoma requiring cessation of anticoagulation. This is an indication for IVC filter placement for PE prophylaxis. EXAM: ULTRASOUND GUIDANCE FOR  VASCULARACCESS IVC CATHETERIZATION AND VENOGRAM IVC FILTER INSERTION Interventional Radiologist:  Criselda Peaches, MD MEDICATIONS: None. ANESTHESIA/SEDATION: Fentanyl 50 mcg IV; Versed 1 mg IV Moderate Sedation Time:  10 minutes The patient was continuously monitored during the procedure by the interventional radiology nurse under my direct supervision. FLUOROSCOPY TIME:  Fluoroscopy Time: 0 minutes 24 seconds (45 mGy). COMPLICATIONS: None immediate. Estimated blood loss: None PROCEDURE: Informed written consent was obtained from the patient after a thorough discussion of the procedural risks, benefits and alternatives. All questions were addressed. Maximal Sterile Barrier Technique was utilized including caps, mask, sterile gowns, sterile gloves, sterile drape, hand hygiene and skin antiseptic. A timeout was performed prior to the initiation of the procedure. Maximal barrier sterile technique utilized including caps, mask, sterile gowns, sterile gloves, large sterile drape, hand hygiene, and Betadine prep. Under sterile condition and local anesthesia, right internal jugular venous access was performed with ultrasound. An ultrasound image was saved and sent to PACS. Over a guidewire, the IVC filter delivery sheath and inner dilator were advanced into the IVC just above the IVC bifurcation. Contrast injection was performed for an IVC venogram using carbon dioxide gas. Through the delivery sheath, a retrievable Denali IVC filter was deployed below  the level of the renal veins and above the IVC bifurcation. Limited post deployment venacavagram was performed. The delivery sheath was removed and hemostasis was obtained with manual compression. A dressing was placed. The patient tolerated the procedure well without immediate post procedural complication. FINDINGS: The IVC is patent. No evidence of thrombus, stenosis, or occlusion. No variant venous anatomy. Successful placement of the IVC filter below the level of the  renal veins. IMPRESSION: Successful ultrasound and fluoroscopically guided placement of an infrarenal retrievable IVC filter via right jugular approach. Given patient age and limited ability to ambulate this IVC filter should be considered a permanent device. No dedicated temple we made at follow-up for filter retrieval. However, if the patient's clinical status were to change in the future the filter is potentially retrievable. Signed, Criselda Peaches, MD [facility] Electronically Signed   By: Jacqulynn Cadet M.D.   On: 10/11/2015 11:48   Dg Abd 2 Views  10/09/2015  CLINICAL DATA:  Abdominal pain.  Nausea and diarrhea for 1 day. EXAM: ABDOMEN - 2 VIEW COMPARISON:  CT of 01/23/2015 FINDINGS: Upright and right-sided decubitus views. Dual lead pacer. No free intraperitoneal air. No significant air fluid levels. Bilateral hip arthroplasty. No bowel distension. Cholecystectomy. No abnormal abdominal calcifications. No appendicolith. Vascular calcifications. Cardiomegaly. Right hemidiaphragm elevation. IMPRESSION: No acute findings. Electronically Signed   By: Abigail Miyamoto M.D.   On: 10/09/2015 18:13   Ir Vertebroplasty Lumbar Bx Inc Uni/bil Inc Inject/imaging  10/13/2015  INDICATION: Severe sacral pain.  Sacral insufficiency fractures. EXAM: IR VERTEBROPLASTY SACRAL 1 VERTEBROPLASTY MEDICATIONS: As antibiotic prophylaxis, 2 g Ancef IV was ordered pre-procedure and administered intravenously within 1 hour of incision. ANESTHESIA/SEDATION: Moderate (conscious) sedation was employed during this procedure. A total of Versed 1 mg and Fentanyl 25 mcg was administered intravenously. Moderate Sedation Time: 25 minutes. The patient's level of consciousness and vital signs were monitored continuously by radiology nursing throughout the procedure under my direct supervision. FLUOROSCOPY TIME:  Fluoroscopy Time: 10 minutes 12 seconds (123XX123 mGy) COMPLICATIONS:  None immediate. TECHNIQUE: Informed written consent was obtained from the patient after a thorough discussion of the procedural risks, benefits and alternatives. All questions were addressed. Maximal Sterile Barrier Technique was utilized including caps, mask, sterile gowns, sterile gloves, sterile drape, hand hygiene and skin antiseptic. A timeout was performed prior to the initiation of the procedure. PROCEDURE: The patient was placed prone on the fluoroscopic table. Nasal oxygen was administered. Physiologic monitoring was performed throughout the duration of the procedure. The skin overlying the sacral region was prepped and draped in the usual sterile fashion. The skin entry sites were identified and infiltrated with 0.25% bupivacaine and carried to the S1 vertebral bodies. This was then followed by the advancement of a 13-gauge Cook needles into the anterior 1/3 at S1 bilaterally. Gentle contrast injection demonstrated a trabecular pattern contrast with early opacification into the soft tissues. This prompted the use of Gel-Foam pledgets through the 13 gauge Cook spinal needles prior to the delivery of the methylmethacrylate mixture. At this time, methylmethacrylate mixture was reconstituted. Under biplane intermittent fluoroscopy, the methylmethacrylate was then injected into the S1 vertebral body with filling of the vertebral body. No extravasation was noted into the disk spaces or into the presacral space, or into the parapelvic veins. The needle was then removed. Hemostasis was achieved at the skin entry sites. There were no acute complications. The patient tolerated the procedure well. The patient was then returned to her  floor in stable condition. IMPRESSION: Status post vertebral body augmentation for painful compression fractures at S1 using vertebroplasty technique. Electronically Signed   By: Luanne Bras M.D.   On: 10/12/2015 15:32    Lab Data:  CBC:  Recent Labs Lab 10/09/15 0546  10/10/15 0555  10/11/15 0035 10/11/15 0807 10/12/15 1210 10/13/15 0520 10/14/15 0727  WBC 17.2* 19.5*  --   --   --  11.8* 12.9* 13.2*  NEUTROABS  --   --   --   --   --  9.2*  --   --   HGB 9.7* 7.8*  < > 8.8* 8.3* 8.2* 8.0* 7.8*  HCT 30.5* 23.2*  < > 26.8* 24.9* 24.8* 24.5* 24.5*  MCV 112.5* 108.9*  --   --   --  100.8* 102.1* 105.6*  PLT 148* 162  --   --   --  149* 153 150  < > = values in this interval not displayed. Basic Metabolic Panel:  Recent Labs Lab 10/07/15 1435  10/09/15 0546 10/10/15 0555 10/12/15 0545 10/13/15 0520 10/14/15 0727  NA 141  < > 142 139 136 137 137  K 4.3  < > 3.3* 3.4* 3.3* 4.3 4.3  CL 105  < > 105 101 103 105 106  CO2 28  < > 27 26 25 24 26   GLUCOSE 150*  < > 88 100* 86 98 90  BUN 28*  < > 21* 27* 26* 21* 19  CREATININE 1.44*  < > 1.36* 1.66* 1.24* 1.04* 0.97  CALCIUM 8.7*  < > 8.4* 8.3* 8.3* 8.5* 8.7*  MG 1.9  --   --   --   --   --   --   < > = values in this interval not displayed. GFR: Estimated Creatinine Clearance: 36.1 mL/min (by C-G formula based on Cr of 0.97). Liver Function Tests:  Recent Labs Lab 10/07/15 1435  AST 18  ALT 24  ALKPHOS 169*  BILITOT 1.0  PROT 5.6*  ALBUMIN 2.5*   No results for input(s): LIPASE, AMYLASE in the last 168 hours. No results for input(s): AMMONIA in the last 168 hours. Coagulation Profile:  Recent Labs Lab 10/08/15 1154  INR 1.27   Cardiac Enzymes: No results for input(s): CKTOTAL, CKMB, CKMBINDEX, TROPONINI in the last 168 hours. BNP (last 3 results)  Recent Labs  03/18/15 1643  PROBNP 288.0*   HbA1C: No results for input(s): HGBA1C in the last 72 hours. CBG: No results for input(s): GLUCAP in the last 168 hours. Lipid Profile: No results for input(s): CHOL, HDL, LDLCALC, TRIG, CHOLHDL, LDLDIRECT in the last 72 hours. Thyroid Function Tests: No results for input(s): TSH, T4TOTAL, FREET4, T3FREE, THYROIDAB in the last 72 hours. Anemia Panel: No results for input(s):  VITAMINB12, FOLATE, FERRITIN, TIBC, IRON, RETICCTPCT in the last 72 hours. Urine analysis:    Component Value Date/Time   COLORURINE YELLOW 03/18/2015 1643   APPEARANCEUR CLEAR 03/18/2015 1643   LABSPEC 1.010 03/18/2015 1643   PHURINE 6.0 03/18/2015 1643   GLUCOSEU NEGATIVE 03/18/2015 1643   GLUCOSEU NEGATIVE 01/23/2015 1606   HGBUR NEGATIVE 03/18/2015 1643   HGBUR large 01/26/2010 1104   BILIRUBINUR NEGATIVE 03/18/2015 [place] 03/18/2015 1643   PROTEINUR NEGATIVE 01/23/2015 1606   UROBILINOGEN 0.2 03/18/2015 1643   NITRITE NEGATIVE 03/18/2015 [place] 03/18/2015 [place] M.D. [facility] 10/14/2015, 10:57 AM  Pager: [phone] Between 7am to 7pm - call Pager -  [phone]  After 7pm go to [email or web address] - password [email or web address]  Call night coverage person covering after 7pm

## 2015-10-14 NOTE — Evaluation (Signed)
Physical Therapy Evaluation Patient Details Name: Nicole Roberts MRN: [id number] DOB: [birth year] Today's Date: 10/14/2015   History of Present Illness  82 yo female fell and hit her leg over 3 weeks ago and developed a skin tear. About a week ago it started to get a little red. She was started on keflex 2 days ago. It has gotten worse and the redness is now over the anterior leg leg up past the knee. No fevers. It has been painful. Pt referred for admission for cellulitis. Also with sacral fx, now s/p kyphoplasty 6/12; She complained of abdominal pain and has a non contrast CT scan which revealed a rectus sheath hematoma; PMH of HTN, RA, afib, CHF  Clinical Impression   Pt admitted with above diagnosis. Pt currently with functional limitations due to the deficits listed below (see PT Problem List).  Pt will benefit from skilled PT to increase their independence and safety with mobility to allow discharge to the venue listed below.       Follow Up Recommendations SNF    Equipment Recommendations  None recommended by PT    Recommendations for Other Services       Precautions / Restrictions Precautions Precautions: Fall      Mobility  Bed Mobility Overal bed mobility: Needs Assistance Bed Mobility: Supine to Sit     Supine to sit: Mod assist     General bed mobility comments: Light mod handheld assist to pull to sit; pt had good use of rails and very good initiation of moving LEs to EOB; used bed pad to square off hips at EOB  Transfers Overall transfer level: Needs assistance Equipment used:  (bed pad to cradle hips) Transfers: Lateral/Scoot Transfers          Lateral/Scoot Transfers: Mod assist General transfer comment: [name] performed a lateral scoot transfer bed to recliner on her R side with drop-arm down; mod assist for making successive scoots, and used bed pad to eliminate shear while making her way to the recliner  Ambulation/Gait                Stairs            Wheelchair Mobility    Modified Rankin (Stroke Patients Only)       Balance Overall balance assessment: Needs assistance   Sitting balance-Leahy Scale: Good Sitting balance - Comments: able to weight shift laterally fo rreciprocal scooting                                     Pertinent Vitals/Pain Pain Assessment: Faces Faces Pain Scale: Hurts a little bit Pain Location: "bottom" Pain Descriptors / Indicators: Aching Pain Intervention(s): Repositioned;Patient requesting pain meds-RN notified    Home Living Family/patient expects to be discharged to:: Skilled nursing facility                      Prior Function Level of Independence: Needs assistance   Gait / Transfers Assistance Needed: min assist for transfers; gets out in the community a lot with family; at wheelchair level  ADL's / Homemaking Assistance Needed: an aide assists with bathing/dressing        Hand Dominance        Extremity/Trunk Assessment   Upper Extremity Assessment: Generalized weakness           Lower Extremity Assessment: Generalized weakness (R lower leg wrapped in dressing;  bilateral plantarflexion contractures)         Communication   Communication: HOH  Cognition Arousal/Alertness: Awake/alert Behavior During Therapy: WFL for tasks assessed/performed Overall Cognitive Status: Within Functional Limits for tasks assessed                      General Comments General comments (skin integrity, edema, etc.): discussed methods for taking pressure off of sore bottom    Exercises        Assessment/Plan    PT Assessment Patient needs continued PT services  PT Diagnosis Generalized weakness;Acute pain   PT Problem List Decreased strength;Decreased activity tolerance;Decreased balance;Decreased mobility;Pain;Decreased knowledge of use of DME  PT Treatment Interventions DME instruction;Functional mobility  training;Therapeutic activities;Therapeutic exercise;Balance training;Neuromuscular re-education;Patient/family education   PT Goals (Current goals can be found in the Care Plan section) Acute Rehab PT Goals Patient Stated Goal: wants to rehab and get back to [facility] PT Goal Formulation: With patient Time For Goal Achievement: 10/28/15 Potential to Achieve Goals: Good    Frequency Min 3X/week   Barriers to discharge        Co-evaluation               End of Session Equipment Utilized During Treatment:  (bed pad) Activity Tolerance: Patient tolerated treatment well Patient left: in chair;with call bell/phone within reach;with chair alarm set;with family/visitor present Nurse Communication: Mobility status;Patient requests pain meds         Time: [id number] PT Time Calculation (min) (ACUTE ONLY): 35 min   Charges:   PT Evaluation $PT Eval Moderate Complexity: 1 Procedure PT Treatments $Therapeutic Activity: 8-22 mins   PT G Codes:        Quin Hoop 10/14/2015, 5:15 PM  [place], [place]  Acute Rehabilitation Services Pager [phone] Office [phone]

## 2015-10-15 LAB — BASIC METABOLIC PANEL
Anion gap: 5 (ref 5–15)
BUN: 17 mg/dL (ref 6–20)
CALCIUM: 8.4 mg/dL — AB (ref 8.9–10.3)
CO2: 26 mmol/L (ref 22–32)
CREATININE: 0.99 mg/dL (ref 0.44–1.00)
Chloride: 106 mmol/L (ref 101–111)
GFR calc Af Amer: 56 mL/min — ABNORMAL LOW (ref >60)
GFR calc non Af Amer: 48 mL/min — ABNORMAL LOW (ref >60)
GLUCOSE: 89 mg/dL (ref 65–99)
Potassium: 4.6 mmol/L (ref 3.5–5.1)
Sodium: 137 mmol/L (ref 135–145)

## 2015-10-15 LAB — CULTURE, BLOOD (ROUTINE X 2)
CULTURE: NO GROWTH
Culture: NO GROWTH
Culture: NO GROWTH
Culture: NO GROWTH

## 2015-10-15 LAB — CBC
HEMATOCRIT: 25.6 % — AB (ref 36.0–46.0)
Hemoglobin: 8 g/dL — ABNORMAL LOW (ref 12.0–15.0)
MCH: 33.1 pg (ref 26.0–34.0)
MCHC: 31.3 g/dL (ref 30.0–36.0)
MCV: 105.8 fL — AB (ref 78.0–100.0)
Platelets: 158 10*3/uL (ref 150–400)
RBC: 2.42 MIL/uL — ABNORMAL LOW (ref 3.87–5.11)
RDW: 20.2 % — AB (ref 11.5–15.5)
WBC: 14.7 10*3/uL — ABNORMAL HIGH (ref 4.0–10.5)

## 2015-10-15 MED ORDER — LOPERAMIDE HCL 2 MG PO CAPS: 2.0000 mg | ORAL_CAPSULE | Freq: Four times a day (QID) | ORAL | Status: DC | PRN

## 2015-10-15 MED ORDER — APIXABAN 2.5 MG PO TABS: 2.5000 mg | ORAL_TABLET | Freq: Two times a day (BID) | ORAL | Status: DC

## 2015-10-15 MED ORDER — CITALOPRAM HYDROBROMIDE 20 MG PO TABS: 20.0000 mg | ORAL_TABLET | Freq: Every day | ORAL | Status: DC

## 2015-10-15 MED ORDER — FENTANYL 75 MCG/HR TD PT72: 75.0000 ug | MEDICATED_PATCH | TRANSDERMAL | Status: DC

## 2015-10-15 MED ORDER — ACETAMINOPHEN 500 MG PO TABS: 1000.0000 mg | ORAL_TABLET | Freq: Three times a day (TID) | ORAL | Status: DC

## 2015-10-15 MED ORDER — AMOXICILLIN-POT CLAVULANATE 875-125 MG PO TABS: 1.0000 | ORAL_TABLET | Freq: Two times a day (BID) | ORAL | Status: DC

## 2015-10-15 MED ORDER — HYDROCODONE-ACETAMINOPHEN 5-325 MG PO TABS: 1.0000 | ORAL_TABLET | Freq: Four times a day (QID) | ORAL | Status: DC | PRN

## 2015-10-15 NOTE — Discharge Summary (Signed)
Physician Discharge Summary   Patient ID: Nicole Roberts MRN: [id number] DOB/AGE: 80-Dec-1923 80 y.o.  Admit date: 10/06/2015 Discharge date: 10/15/2015  Primary Care Physician:  [name], MD  Discharge Diagnoses:     . Cellulitis   Acute on chronic back pain secondary to sacral fracture status post kyphoplasty    Right leg DVT status post IVC filter   Rectus sheath hematoma  . Essential hypertension . Atrial fibrillation ([place]) . mild acute on Stage III chronic kidney disease . Rheumatoid arthritis ([place]) . Chronic combined systolic and diastolic CHF (congestive heart failure) ([place])  Consults:   Cardiology, Dr. Brayton El. surgery  Recommendations for Outpatient Follow-up:   1 ) Please note ELIQUIS is currently on HOLD due to large rectus sheath hematoma. Per cardiology and surgery recommendations, hold as long as hematoma is not expanding. CT abdomen was repeated on 6/14 which showed enlarged hematoma. Recommend to hold eliquis, for a week and reevaluate patient  prior to restarting it.  CT abd 6/14 IMPRESSION: Large bilobed abdominal wall hematoma with components measuring 7.3 x 4.8 x 9.4 cm and 9.5 x 3.8 x 6.3 cm at the anterolateral and lateral RIGHT abdomen extending into the superior RIGHT pelvis.  When compared to the previous study, these appeared little changed.  2)  Please repeat CBC/BMET at next visit   DIET heart healthy diet    Allergies:   Allergies  Allergen Reactions  . Sulfa Antibiotics Itching  . Tramadol Hcl Itching and Rash     DISCHARGE MEDICATIONS: Current Discharge Medication List    START taking these medications   Details  acetaminophen (TYLENOL) 500 MG tablet Take 2 tablets (1,000 mg total) by mouth every 8 (eight) hours. X 3 days Qty: 30 tablet, Refills: 0    amoxicillin-clavulanate (AUGMENTIN) 875-125 MG tablet Take 1 tablet by mouth 2 (two) times daily. Qty: 10 tablet, Refills: 0    loperamide (IMODIUM) 2 MG capsule Take 1  capsule (2 mg total) by mouth every 6 (six) hours as needed for diarrhea or loose stools. Qty: 30 capsule, Refills: 0      CONTINUE these medications which have CHANGED   Details  apixaban (ELIQUIS) 2.5 MG TABS tablet Take 1 tablet (2.5 mg total) by mouth 2 (two) times daily. tentative starting date 10/21/15 but will need MD to reassess abdominal hematoma prior to starting eliquis Qty: 60 tablet, Refills: 11    citalopram (CELEXA) 20 MG tablet Take 1 tablet (20 mg total) by mouth daily. Qty: 30 tablet, Refills: 0    fentaNYL (DURAGESIC - DOSED MCG/HR) 75 MCG/HR Place 1 patch (75 mcg total) onto the skin every 3 (three) days. Qty: 4 patch, Refills: 0    HYDROcodone-acetaminophen (NORCO/VICODIN) 5-325 MG tablet Take 1 tablet by mouth every 6 (six) hours as needed for severe pain. Qty: 20 tablet, Refills: 0      CONTINUE these medications which have NOT CHANGED   Details  calcium citrate-vitamin D (CITRACAL+D) 315-200 MG-UNIT tablet Take 1 tablet by mouth 2 (two) times daily. Qty: 180 tablet, Refills: 3    cholecalciferol (VITAMIN D) 1000 UNITS tablet Take 1,000 Units by mouth daily.    conjugated estrogens (PREMARIN) vaginal cream Place 1 Applicatorful vaginally 3 (three) times a week.    folic acid (FOLVITE) A999333 MCG tablet Take 400 mcg by mouth every morning.    furosemide (LASIX) 20 MG tablet Take 2 tablets (40 mg total) by mouth daily. Until directed to decrease back to 20 mg daily  Qty: 180 tablet, Refills: 1    gabapentin (NEURONTIN) 400 MG capsule Take 2 capsules (800 mg total) by mouth at bedtime. Qty: 180 capsule, Refills: 1    isosorbide mononitrate (IMDUR) 30 MG 24 hr tablet Take 15 mg by mouth daily.    levothyroxine (SYNTHROID, LEVOTHROID) 50 MCG tablet Take 1 tablet (50 mcg total) by mouth daily. Qty: 90 tablet, Refills: 3    magnesium oxide (MAG-OX) 400 MG tablet Take 400 mg by mouth daily.    metoprolol succinate (TOPROL-XL) 50 MG 24 hr tablet Take 1 tablet (50  mg total) by mouth 2 (two) times daily. Take with or immediately following a meal. Qty: 60 tablet, Refills: 11    Multiple Vitamin (DAILY-VITE) TABS TAKE ONE TABLET BY MOUTH ONCE DAILY. Qty: 30 tablet, Refills: 2    nitroGLYCERIN (NITROSTAT) 0.4 MG SL tablet Place 0.4 mg under the tongue every 5 (five) minutes x 3 doses as needed for chest pain.    nystatin (MYCOSTATIN) 100000 UNIT/ML suspension Take 5 mLs (500,000 Units total) by mouth 4 (four) times daily. Use for two days after symptoms have resolved and then stop Qty: 473 mL, Refills: 0    pantoprazole (PROTONIX) 40 MG tablet Take 1 tablet (40 mg total) by mouth 2 (two) times daily. Qty: 60 tablet, Refills: 3    potassium chloride SA (K-DUR,KLOR-CON) 20 MEQ tablet TAKE ONE TABLET BY MOUTH ONCE DAILY. Qty: 30 tablet, Refills: 5    predniSONE (DELTASONE) 5 MG tablet Take 5 mg by mouth daily with breakfast.    sucralfate (CARAFATE) 1 GM/10ML suspension Take 1 g by mouth 2 (two) times daily.    vitamin B-12 (CYANOCOBALAMIN) 500 MCG tablet TAKE ONE TABLET BY MOUTH ONCE DAILY. Qty: 30 tablet, Refills: 11      STOP taking these medications     cephALEXin (KEFLEX) 500 MG capsule          Brief H and P: For complete details please refer to admission H and P, but in brief Patient is a 82 year old from [facility] skilled nursing facility with history of atrial fibrillation, hypertension and hyperlipidemia, GERD, hypothyroidism, anemia, TIA presented to ED with cellulitis, leg pain. Patient apparently fell and hit her leg over 3 weeks ago and developed a skin tear. About a week prior to admission he started to get more red and erythematous. She was started on Keflex 2 days prior to admission. The redness worsened despite antibiotics over to the anterior leg up to the knee. No fevers. Patient was admitted for cellulitis.  Hospital Course:  Cellulitis- - Outpatient failure to Keflex - Patient was placed on IV vancomycin and Zosyn,  Transitioned to Augmentin - Wound care was also consulted. Per recommendations Silver hydrofiber to the wound bed for exudate, ACE wrap for light compression to lessen edema. Change daily.    Acute on chronic back pain, recent fall, radiating to legs Likely due to sacral fracture - CT of the lumbar spine showed left sacral fracture. - IR was consulted for kyphoplasty, done on 6/12 - continue Norco every 6 hours as needed only for severe pain, scheduled Tylenol x 3 days, fentanyl patch. - Continue physical therapy  Right leg pain due to right leg DVT - Doppler ultrasound of LE positive for DVT in the right posterior tibial and peroneal vein.  - Patient was on eliquis with renal dosing prior to admission, and, then placed on IV heparin drip in anticipation of the kyphoplasty procedure. Patient was complaining of right  leg pain and swelling hence Doppler ultrasound was done which showed right leg DVT. IV heparin drip was subsequently discontinued it due to rectus sheath hematoma  -IR was consulted and IVC filter placed on 6/11  Rectus sheath abd hematoma/Acute blood loss anemia - Abdominal x-ray 6/9 was negative for any SBO or perforation - CT abd : Acute extraperitoneal hemorrhage/rectus sheath hematoma 6.7x 9.9cm - Continue conservative management, appreciate surgery recommendations. Will restart eliquis in 1 week, if H&H stable and no expanding hematoma.   Atrial fibrillation ([facility]) - Currently rate controlled currently not on any anticoagulation due to abdominal wall hematoma - Per patient's son, he feels the stroke risk is high if she is off AC and wanted to restart eliquis and have surgical option for rectus sheath hematoma. I appreciate cardiology recommendations, recommended restart eliquis 2.5 mg twice a day when okay with surgery - Discussed with patient's son, general consensus is to hold AC 7-14 days for rectus sheath hematoma and restart if hematoma is not expanding and  hemoglobin is stable. - Gen. surgery was consulted also recommended no surgical options for the rectus sheath hematoma, continue to hold eliquis for 7-14days until H&H remained stable and no further expanding hematoma. CT abdomen and pelvis was repeated on 6/14 as patient was complaining of abdominal pain which showed no interval change. Eliquis can be restarted on 6/21 if H&H remains stable and no further expanding hematoma. Leave abdominal binder on   Essential hypertension - Currently controlled - Continue furosemide, Imdur, metoprolol   Rheumatoid arthritis (HCC) - On chronic steroids   Mild acute on chronic Stage III chronic kidney disease - Baseline creatinine 1.3-1.4, currently at baseline   Chronic combined systolic and diastolic CHF (congestive heart failure) (HCC) -Currently compensated    Day of Discharge BP 140/64 mmHg  Pulse 77  Temp(Src) 97.7 F (36.5 C) (Oral)  Resp 18  Ht 5\' 4"  (1.626 m)  Wt 68.901 kg (151 lb 14.4 oz)  BMI 26.06 kg/m2  SpO2 98%  Physical Exam: General: Alert and awake oriented x3 not in any acute distress. HEENT: anicteric sclera, pupils reactive to light and accommodation CVS: S1-S2 clear no murmur rubs or gallops Chest: clear to auscultation bilaterally, no wheezing rales or rhonchi Abdomen: soft nontender, nondistended, normal bowel sounds Extremities: no cyanosis, clubbing or edema noted bilaterally Neuro: Cranial nerves II-XII intact, no focal neurological deficits   The results of significant diagnostics from this hospitalization (including imaging, microbiology, ancillary and laboratory) are listed below for reference.    LAB RESULTS: Basic Metabolic Panel:  Recent Labs Lab 10/14/15 0727 10/15/15 0639  NA 137 137  K 4.3 4.6  CL 106 106  CO2 26 26  GLUCOSE 90 89  BUN 19 17  CREATININE 0.97 0.99  CALCIUM 8.7* 8.4*   Liver Function Tests: No results for input(s): AST, ALT, ALKPHOS, BILITOT, PROT, ALBUMIN in the last 168  hours. No results for input(s): LIPASE, AMYLASE in the last 168 hours. No results for input(s): AMMONIA in the last 168 hours. CBC:  Recent Labs Lab 10/12/15 1210  10/14/15 0727 10/15/15 0639  WBC 11.8*  < > 13.2* 14.7*  NEUTROABS 9.2*  --   --   --   HGB 8.2*  < > 7.8* 8.0*  HCT 24.8*  < > 24.5* 25.6*  MCV 100.8*  < > 105.6* 105.8*  PLT 149*  < > 150 158  < > = values in this interval not displayed. Cardiac Enzymes: No results for input(s):  CKTOTAL, CKMB, CKMBINDEX, TROPONINI in the last 168 hours. BNP: Invalid input(s): POCBNP CBG: No results for input(s): GLUCAP in the last 168 hours.  Significant Diagnostic Studies:  Dg Tibia/fibula Left  10/06/2015  CLINICAL DATA:  Fall 10 days ago. Skin tear with possible infection. Initial encounter. EXAM: LEFT TIBIA AND FIBULA - 2 VIEW COMPARISON:  None. FINDINGS: Soft tissue swelling greatest about the lateral lower leg. There is no soft tissue gas or opaque foreign body. Negative for fracture or signs of osteomyelitis. Osteopenia and knee osteoarthritis. Arterial and soft tissue calcifications. IMPRESSION: Soft tissue swelling without opaque foreign body, soft tissue emphysema, or evidence of osteomyelitis. Electronically Signed   By: Monte Fantasia M.D.   On: 10/06/2015 22:00   Ct Lumbar Spine Wo Contrast  10/07/2015  CLINICAL DATA:  Chronic low back pain. History of fall 3 weeks ago. No known injury. Initial encounter. EXAM: CT LUMBAR SPINE WITHOUT CONTRAST TECHNIQUE: Multidetector CT imaging of the lumbar spine was performed without intravenous contrast administration. Multiplanar CT image reconstructions were also generated. COMPARISON:  CT chest, abdomen and pelvis 05/14/2011. FINDINGS: Vertebral body height and alignment are maintained. No lytic or sclerotic bony lesion is identified. No pars interarticularis defect is seen. Advanced degenerative change is present about the sacroiliac joints. A subtle cortical lucency in the anterior  aspect of the left sacrum on images 117-121 of series 8 and 96-99 of series 5 is worrisome for insufficiency fracture. No other evidence fracture is identified. Imaged intra-abdominal contents demonstrate aortoiliac atherosclerosis. Bilateral renal cysts are partially visualized. The patient is status post cholecystectomy. T10-11: Small calcified disc bulge without central canal or foraminal stenosis. T11-12: Loss of disc space height and facet degenerative change. The central canal appears open. Bilateral foraminal narrowing is seen. T12-L1: Broad-based disc bulge with endplate spur. The central canal and foramina appear open. L1-2: Shallow broad-based central protrusion with calcification of the annulus. The central canal and foramina appear open. L2-3: Mild disc bulge and endplate spur without central canal or foraminal stenosis. L3-4: There is some ligamentum flavum thickening and a broad-based disc bulge with endplate spur. Mild central canal narrowing is seen. There is also some bilateral foraminal narrowing. L4-5: Disc bulge, endplate spur and facet arthropathy are seen. There is mild to moderate central canal narrowing. Marked bilateral foraminal narrowing is also seen. L5-S1: There is facet degenerative change and a shallow broad-based disc bulge. The central canal and right foramen are open. Mild to moderate left foraminal narrowing is identified. IMPRESSION: Findings highly suspicious for a nondisplaced left sacral fracture. Negative for lumbar spine fracture. Multilevel degenerative disc disease as detailed above. Electronically Signed   By: Inge Rise M.D.   On: 10/07/2015 16:24    2D ECHO:   Disposition and Follow-up:    DISPOSITION: Skilled nursing facility DISCHARGE FOLLOW-UP Follow-up Information    Follow up with [name], MD. Schedule an appointment as soon as possible for a visit in 2 weeks.   Specialty:  Internal Medicine   Why:  for hospital follow-up   Contact  information:   [place] [address] [phone]       Follow up with Thompson Grayer, MD. Schedule an appointment as soon as possible for a visit in 10 days.   Specialty:  Cardiology   Why:  for hospital follow-up, regarding anticoagulation/eliquis   Contact information:   [place] [address] [phone]        Time spent  on Discharge: 45 minutes  Signed:   RAI,RIPUDEEP M.D. [facility] 10/15/2015, 1:18 PM Pager: [phone]

## 2015-10-15 NOTE — Clinical Social Work Placement (Signed)
   CLINICAL SOCIAL WORK PLACEMENT  NOTE  Date:  10/15/2015  Patient Details  Name: Nicole Roberts MRN: [id number] Date of Birth: 08-08-1921  Clinical Social Work is seeking post-discharge placement for this patient at the [place] level of care (*CSW will initial, date and re-position this form in  chart as items are completed):  Yes   Patient/family provided with [place] Work Department's list of facilities offering this level of care within the geographic area requested by the patient (or if unable, by the patient's family).  Yes   Patient/family informed of their freedom to choose among providers that offer the needed level of care, that participate in Medicare, Medicaid or managed care program needed by the patient, have an available bed and are willing to accept the patient.  Yes   Patient/family informed of [facility]'s ownership interest in [facility] and [facility], as well as of the fact that they are under no obligation to receive care at these facilities.  PASRR submitted to EDS on       PASRR number received on       Existing PASRR number confirmed on 10/13/15     FL2 transmitted to all facilities in geographic area requested by pt/family on 10/13/15     FL2 transmitted to all facilities within larger geographic area on       Patient informed that his/her managed care company has contracts with or will negotiate with certain facilities, including the following:        Yes   Patient/family informed of bed offers received.  Patient chooses bed at [facility]     Physician recommends and patient chooses bed at      Patient to be transferred to [facility] on 10/15/15.  Patient to be transferred to facility by [facility]     Patient family notified on 10/15/15 of transfer.  Name of family member notified:  Daughter in law     PHYSICIAN       Additional Comment:    _______________________________________________ Benard Halsted, [place] 10/15/2015, 1:50 PM

## 2015-10-15 NOTE — Progress Notes (Signed)
Patient will DC to: [facility] Anticipated DC date: 10/15/15 Family notified: Daughter-in-law/son at bedside Transport by: Domenica Reamer   Per MD patient ready for DC to [facility]. RN, patient, patient's family, and facility notified of DC. RN given number for report. DC packet on chart. Ambulance transport requested for patient.   CSW signing off.  Cedric Fishman, [place] Social Worker [phone]

## 2015-10-15 NOTE — Care Management Note (Signed)
Case Management Note  Patient Details  Name: Nicole Roberts MRN: [id number] Date of Birth: 12/18/1921  Subjective/Objective:                    Action/Plan: Plan is to d/c today to SNF/rehab.  Expected Discharge Date:    10/15/2015          Expected Discharge Plan:  SNF  In-House Referral:  Clinical Social Work  Discharge planning Services  CM Consult  Post Acute Care Choice:    Choice offered to:     DME Arranged:    DME Agency:     HH Arranged:    [place]:     Status of Service:  Completed, signed off   [facility] [place], [place] [phone] 10/15/2015, 12:03 PM

## 2015-10-15 NOTE — Progress Notes (Signed)
Report called to nursing supervisor at [facility]. Patients IVs removed, and discharge instructions given to son. Fentanyl patch applied 2 hours early which is to be changed q3 days.

## 2015-10-16 ENCOUNTER — Telehealth: Payer: Self-pay | Admitting: Internal Medicine

## 2015-10-16 ENCOUNTER — Encounter: Payer: Self-pay | Admitting: *Deleted

## 2015-10-16 NOTE — Telephone Encounter (Signed)
SPOKE WITH PT'S SON   PT HAD  BLEED  AND  ELIQUIS  WAS STOPPED AND  PT  NOT  TO RESTART  UNTIL 10-22-15  PER  HOSPITAL  DISCHARGE . SON HAS  CONCERNS  THAT  PT MAY  BE  HAVING TIA'S  DISCUSSED  WITH DR Rayann Heman   AND  DR  Rayann Heman  WILL NOT CHANGE  MED RESTART  DATE  AS  PT WAS  NOT  SEEN PT'S SON AWARE  .Adonis Housekeeper

## 2015-10-16 NOTE — Telephone Encounter (Signed)
Pt's son calling c/o pt just got out of hospital-concerned with Afib and stroke

## 2015-10-19 ENCOUNTER — Other Ambulatory Visit: Payer: Self-pay | Admitting: Geriatric Medicine

## 2015-10-19 DIAGNOSIS — S301XXA Contusion of abdominal wall, initial encounter: Secondary | ICD-10-CM

## 2015-10-21 ENCOUNTER — Ambulatory Visit
Admission: RE | Admit: 2015-10-21 | Discharge: 2015-10-21 | Disposition: A | Discharge status: Home / Self Care | Payer: Medicare Other | Source: Ambulatory Visit | Attending: Geriatric Medicine | Admitting: Geriatric Medicine

## 2015-10-21 ENCOUNTER — Other Ambulatory Visit: Payer: Medicare Other

## 2015-10-21 DIAGNOSIS — S301XXA Contusion of abdominal wall, initial encounter: Secondary | ICD-10-CM

## 2015-10-26 ENCOUNTER — Encounter: Payer: Self-pay | Admitting: Nurse Practitioner

## 2015-10-26 ENCOUNTER — Encounter: Payer: Self-pay | Admitting: Internal Medicine

## 2015-10-26 ENCOUNTER — Ambulatory Visit (INDEPENDENT_AMBULATORY_CARE_PROVIDER_SITE_OTHER): Payer: Medicare Other | Admitting: Nurse Practitioner

## 2015-10-26 ENCOUNTER — Ambulatory Visit (INDEPENDENT_AMBULATORY_CARE_PROVIDER_SITE_OTHER): Payer: Medicare Other | Admitting: *Deleted

## 2015-10-26 ENCOUNTER — Ambulatory Visit (INDEPENDENT_AMBULATORY_CARE_PROVIDER_SITE_OTHER)
Admission: RE | Admit: 2015-10-26 | Discharge: 2015-10-26 | Disposition: A | Discharge status: Home / Self Care | Payer: Medicare Other | Source: Ambulatory Visit | Attending: Nurse Practitioner | Admitting: Nurse Practitioner

## 2015-10-26 VITALS — BP 113/71 | HR 81 | Ht 64.0 in

## 2015-10-26 DIAGNOSIS — Z7901 Long term (current) use of anticoagulants: Secondary | ICD-10-CM

## 2015-10-26 DIAGNOSIS — S301XXD Contusion of abdominal wall, subsequent encounter: Secondary | ICD-10-CM

## 2015-10-26 DIAGNOSIS — I482 Chronic atrial fibrillation, unspecified: Secondary | ICD-10-CM

## 2015-10-26 DIAGNOSIS — I5042 Chronic combined systolic (congestive) and diastolic (congestive) heart failure: Secondary | ICD-10-CM

## 2015-10-26 LAB — CBC
HCT: 28.1 % — ABNORMAL LOW (ref 35.0–45.0)
Hemoglobin: 9.2 g/dL — ABNORMAL LOW (ref 11.7–15.5)
MCH: 33.9 pg — ABNORMAL HIGH (ref 27.0–33.0)
MCHC: 32.7 g/dL (ref 32.0–36.0)
MCV: 103.7 fL — ABNORMAL HIGH (ref 80.0–100.0)
MPV: 10.3 fL (ref 7.5–12.5)
Platelets: 213 10*3/uL (ref 140–400)
RBC: 2.71 MIL/uL — ABNORMAL LOW (ref 3.80–5.10)
RDW: 21.1 % — ABNORMAL HIGH (ref 11.0–15.0)
WBC: 9.5 10*3/uL (ref 3.8–10.8)

## 2015-10-26 LAB — BASIC METABOLIC PANEL
BUN: 18 mg/dL (ref 7–25)
CO2: 30 mmol/L (ref 20–31)
Calcium: 8.7 mg/dL (ref 8.6–10.4)
Chloride: 101 mmol/L (ref 98–110)
Creat: 1.05 mg/dL — ABNORMAL HIGH (ref 0.60–0.88)
Glucose, Bld: 107 mg/dL — ABNORMAL HIGH (ref 65–99)
Potassium: 5 mmol/L (ref 3.5–5.3)
Sodium: 140 mmol/L (ref 135–146)

## 2015-10-26 NOTE — Patient Instructions (Addendum)
We will be checking the following labs today - BMET and CBC - STAT  Medication Instructions:    Continue with your current medicines.     Testing/Procedures To Be Arranged:  Repeat abdominal CT scan without contrast today  Pacemaker check today  Follow-Up:   Keep planned follow up with Dr. Rayann Heman and EP staff    Other Special Instructions:   N/A    If you need a refill on your cardiac medications before your next appointment, please call your pharmacy.   Call the [place] office at [phone] if you have any questions, problems or concerns.

## 2015-10-26 NOTE — Progress Notes (Addendum)
CARDIOLOGY OFFICE NOTE  Date:  10/26/2015    Nicole Roberts Date of Birth: 1921/11/07 Medical Record [id number]  PCP:  [name], MD  Cardiologist:  [place]    Chief Complaint  Patient presents with  . Atrial Fibrillation  . Congestive Heart Failure    Post hospital visit - seen for Dr. Rayann Heman    History of Present Illness: Nicole Roberts is a 82 y.o. female who presents today for a post hospital visit. Seen for Dr. Rayann Heman. She has a history of chronic diastolic dysfunction, chronic AF - previously on Eliquis, symptomatic bradycardia with PPM in place, pulmonary HTN, valvular heart disease, CKD and RA.  Other medical issues as noted below.   Most recently admitted with leg pain and cellulitis. She had fallen about 3 weeks prior. Failed on outpatient therapy. She was placed on IV antibiotics, noted to have sacra fracture and had kyphoplasty while admitted, had DVT in the right leg - she was treated with IV heparin but then developed rectus sheath hematoma - measured 6.7 x 9.9 cm - managed conservatively - Eliquis held. General surgery was consulted - no surgical options. Advised to hold her anticoagulation for 7 to 14 days until H & H stablized and get repeat CT scan. To tentatively restart on 6/21. Apparently son wanting her anticoagulation restarted due to increased risk of stroke.   Comes back today. Here with her son. She is in a wheelchair. She is not able to weigh. Her last CT was 5 days ago and showed mild increase in the size. No new sites noted. Son notes that she was placed back on her Eliquis on the 22nd of June. Over the past 1 to 2 days, having some abdominal pain and pain in her legs. Son says she was crying so bad over the weekend - required narcotics. No labs sent with her. No chest pain. No falls.   Past Medical History  Diagnosis Date  . HEARING LOSS   . MITRAL VALVE INSUFF&AORTIC VALVE INSUFF     moderate MR  . PULMONARY HYPERTENSION   . Atrial  fibrillation ([place])     permanent  . SICK SINUS SYNDROME     a. Tachybrady syndrome - Guidant PPM 10/2005.  . Edema 03/19/2009    due to venous insufficiency, chronic lymphedema  . URINARY INCONTINENCE   . Rheumatoid arthritis(714.0) dx clarified 2011    On prednisone  . BREAST CANCER 12/2006    ductal ca s/p L lumpectomy  . Diastolic dysfunction   . HYPERTENSION   . HYPERLIPIDEMIA   . GERD   . HYPOTHYROIDISM   . Hyperparathyroidism     2 lobes removed  . Venous insufficiency     chronic BLE edema  . Spinal stenosis   . Anemia     a. Macrocytic - normal B12, folate 08/2011. b. 3/6 heme positive stools 10/2011 - per PCP note, elected against colonscopy.  [name] TIA (transient ischemic attack) 05/2011  . Anxiety     Past Surgical History  Procedure Laterality Date  . Pacemaker placement  2005    SSS and syncope  . Cholecystectomy  04/06/99  . Abdominal hysterectomy  1970    Partial  . Left hip arthroscopy  1995  . Right hip arthroscopy  01/2001  . Carpal tunnel release  1998    bilateral  . Breast surgery  12/2006    Left breast lupectomy- Ductal CA  . Left parathyroidectomy  07/2008  . Esophagogastroduodenoscopy N/A  10/18/2012    Procedure: ESOPHAGOGASTRODUODENOSCOPY (EGD);  Surgeon: Ladene Artist, MD;  Location: [facility];  Service: Endoscopy;  Laterality: N/A;  . Esophagogastroduodenoscopy (egd) with propofol N/A 01/29/2015    Procedure: ESOPHAGOGASTRODUODENOSCOPY (EGD) WITH PROPOFOL;  Surgeon: Inda Castle, MD;  Location: [facility] ENDOSCOPY;  Service: Endoscopy;  Laterality: N/A;  . Ercp N/A 01/29/2015    Procedure: ENDOSCOPIC RETROGRADE CHOLANGIOPANCREATOGRAPHY (ERCP);  Surgeon: Inda Castle, MD;  Location: [facility];  Service: Endoscopy;  Laterality: N/A;     Medications: Current Outpatient Prescriptions  Medication Sig Dispense Refill  . acetaminophen (TYLENOL) 500 MG tablet Take 2 tablets (1,000 mg total) by mouth every 8 (eight) hours. X 3 days 30 tablet 0  .  amoxicillin-clavulanate (AUGMENTIN) 875-125 MG tablet Take 1 tablet by mouth 2 (two) times daily. 10 tablet 0  . calcium citrate-vitamin D (CITRACAL+D) 315-200 MG-UNIT tablet Take 1 tablet by mouth 2 (two) times daily. (Patient taking differently: Take 1 tablet by mouth 2 (two) times daily. Gummies) 180 tablet 3  . cholecalciferol (VITAMIN D) 1000 UNITS tablet Take 1,000 Units by mouth daily.    . citalopram (CELEXA) 20 MG tablet Take 1 tablet (20 mg total) by mouth daily. 30 tablet 0  . conjugated estrogens (PREMARIN) vaginal cream Place 1 Applicatorful vaginally 3 (three) times a week.    . fentaNYL (DURAGESIC - DOSED MCG/HR) 75 MCG/HR Place 1 patch (75 mcg total) onto the skin every 3 (three) days. 4 patch 0  . folic acid (FOLVITE) A999333 MCG tablet Take 400 mcg by mouth every morning.    . furosemide (LASIX) 20 MG tablet Take 2 tablets (40 mg total) by mouth daily. Until directed to decrease back to 20 mg daily 180 tablet 1  . gabapentin (NEURONTIN) 400 MG capsule Take 2 capsules (800 mg total) by mouth at bedtime. 180 capsule 1  . HYDROcodone-acetaminophen (NORCO/VICODIN) 5-325 MG tablet Take 1 tablet by mouth every 6 (six) hours as needed for severe pain. 20 tablet 0  . isosorbide mononitrate (IMDUR) 30 MG 24 hr tablet Take 15 mg by mouth daily.    [name] levothyroxine (SYNTHROID, LEVOTHROID) 50 MCG tablet Take 1 tablet (50 mcg total) by mouth daily. (Patient taking differently: Take 50 mcg by mouth daily before breakfast. ) 90 tablet 3  . loperamide (IMODIUM) 2 MG capsule Take 1 capsule (2 mg total) by mouth every 6 (six) hours as needed for diarrhea or loose stools. 30 capsule 0  . magnesium oxide (MAG-OX) 400 MG tablet Take 400 mg by mouth daily.    . metoprolol succinate (TOPROL-XL) 50 MG 24 hr tablet Take 1 tablet (50 mg total) by mouth 2 (two) times daily. Take with or immediately following a meal. 60 tablet 11  . Multiple Vitamin (DAILY-VITE) TABS TAKE ONE TABLET BY MOUTH ONCE DAILY. 30 tablet 2    . nitroGLYCERIN (NITROSTAT) 0.4 MG SL tablet Place 0.4 mg under the tongue every 5 (five) minutes x 3 doses as needed for chest pain.    [name] nystatin (MYCOSTATIN) 100000 UNIT/ML suspension Take 5 mLs (500,000 Units total) by mouth 4 (four) times daily. Use for two days after symptoms have resolved and then stop 473 mL 0  . pantoprazole (PROTONIX) 40 MG tablet Take 1 tablet (40 mg total) by mouth 2 (two) times daily. 60 tablet 3  . potassium chloride SA (K-DUR,KLOR-CON) 20 MEQ tablet TAKE ONE TABLET BY MOUTH ONCE DAILY. 30 tablet 5  . predniSONE (DELTASONE) 5 MG tablet Take 5 mg  by mouth daily with breakfast.    . sucralfate (CARAFATE) 1 GM/10ML suspension Take 1 g by mouth 2 (two) times daily.    . vitamin B-12 (CYANOCOBALAMIN) 500 MCG tablet TAKE ONE TABLET BY MOUTH ONCE DAILY. 30 tablet 11  . apixaban (ELIQUIS) 2.5 MG TABS tablet Take 1 tablet (2.5 mg total) by mouth 2 (two) times daily. tentative starting date 10/21/15 but will need MD to reassess abdominal hematoma prior to starting eliquis (Patient not taking: Reported on 10/26/2015) 60 tablet 11   No current facility-administered medications for this visit.    Allergies: Allergies  Allergen Reactions  . Sulfa Antibiotics Itching  . Tramadol Hcl Itching and Rash    Social History: The patient  reports that she has never smoked. She has never used smokeless tobacco. She reports that she does not drink alcohol or use illicit drugs.   Family History: The patient's family history includes Coronary artery disease in her brother and sister; Hypertension in her mother; Lung cancer in her father; Ovarian cancer in her mother.   Review of Systems: Please see the history of present illness.   Otherwise, the review of systems is positive for none.   All other systems are reviewed and negative.   Physical Exam: VS:  BP 113/71 mmHg  Pulse 81  Ht 5\' 4"  (1.626 m) .  BMI There is no weight on file to calculate BMI.  Wt Readings from Last 3  Encounters:  10/15/15 151 lb 14.4 oz (68.901 kg)  08/10/15 154 lb 12.8 oz (70.217 kg)  06/08/15 165 lb (74.844 kg)    General: Pleasant. Elderly female who looks chronically ill but in no acute distress.  HEENT: Normal. Neck: Supple, no JVD, carotid bruits, or masses noted.  Cardiac: Irregular irregular rhythm. Rate ok.  No murmurs, rubs, or gallops. No edema.  Respiratory:  Lungs are clear to auscultation bilaterally with normal work of breathing.  GI: Soft and nontender.  MS: No deformity or atrophy. Gait not tested. She is in a wheelchair. Skin: Warm and dry. Color is normal. Lots of bruising over the arms and legs. Neuro:  Strength and sensation are intact and no gross focal deficits noted.  Psych: Alert, appropriate and with normal affect.   LABORATORY DATA:  EKG:  EKG is not ordered today.  Lab Results  Component Value Date   WBC 14.7* 10/15/2015   HGB 8.0* 10/15/2015   HCT 25.6* 10/15/2015   PLT 158 10/15/2015   GLUCOSE 89 10/15/2015   CHOL 139 06/02/2014   TRIG 171.0* 06/02/2014   HDL 70.50 06/02/2014   LDLDIRECT 131.4 12/28/2009   LDLCALC 34 06/02/2014   ALT 24 10/07/2015   AST 18 10/07/2015   NA 137 10/15/2015   K 4.6 10/15/2015   CL 106 10/15/2015   CREATININE 0.99 10/15/2015   BUN 17 10/15/2015   CO2 26 10/15/2015   TSH 1.44 06/08/2015   INR 1.27 10/08/2015    BNP (last 3 results) No results for input(s): BNP in the last 8760 hours.  ProBNP (last 3 results)  Recent Labs  03/18/15 1643  PROBNP 288.0*     Other Studies Reviewed Today:  CT ABDOMEN IMPRESSION 10/21/2015: Further mild increase in size of acute bilobed right lower quadrant abdominal wall muscular hematoma. No new sites of hematoma identified.  Small hiatal hernia and probable gastroesophageal reflux.  Colonic diverticulosis. No radiographic evidence of diverticulitis.  Tiny nonobstructive left renal calculus.  Aortic atherosclerosis noted.  These results will be  called to the ordering clinician or representative by the Radiologist Assistant, and communication documented in the PACS or zVision Dashboard.   Electronically Signed  By: Earle Gell M.D.  On: 10/21/2015 14:39   Echo Study Conclusions from 04/2015 - Left ventricle: The cavity size was normal. Wall thickness was  increased in a pattern of mild LVH. Systolic function was normal.  The estimated ejection fraction was in the range of 55% to 60%.  The study is not technically sufficient to allow evaluation of LV  diastolic function. - Aortic valve: Trileaflet. Sclerosis without stenosis. There was  no regurgitation. - Mitral valve: Calcified annulus. Mildly thickened leaflets .  There was trivial regurgitation. - Left atrium: Moderately dilated at 39 ml/m2. - Right ventricle: The cavity size was normal. Pacer wire or  catheter noted in right ventricle. Systolic function is reduced. - Right atrium: The atrium was mildly dilated. Pacer wire or  catheter noted in right atrium. - Tricuspid valve: There was moderate regurgitation. - Pulmonary arteries: PA peak pressure: 50 mm Hg (S). - Inferior vena cava: The vessel was normal in size. The  respirophasic diameter changes were in the normal range (= 50%),  consistent with normal central venous pressure.  Impressions:  - Compared to a prior echo in 2014, the EF has now normalized.  Assessment/Plan: 1. Recent fall with subsequent rectus sheath hematoma - noted to have mildly expanded by CT scan 5 days ago. No surgical options. Has been placed back on her Eliquis - now with abdominal and leg pain - discussed with Dr. Rayann Heman - recheck CT scan. Stat labs. Further disposition to follow.   2. Chronic atrial fib - managed with rate control. She is back on anticoagulation.   3. Underlying PPM - apparently it is nearing ERI - needs device checked today.   4. Chronic diastolic HF - stable  5. Pulmonary HTN  6. Sacral  fracture due to fall  7. Advanced age - seems more frail. Unfortunately, she has no real good options regarding anticoagulation. Prognosis tenuous at best.   8. Right leg DVT - has IVC in place. Not clear to me if this was off or or Eliquis.   Current medicines are reviewed with the patient today.  The patient does not have concerns regarding medicines other than what has been noted above.  The following changes have been made:  See above.  Labs/ tests ordered today include:    Orders Placed This Encounter  Procedures  . CT ABDOMEN PELVIS WO CONTRAST  . Basic metabolic panel  . CBC     Disposition:   FU with Dr. Rayann Heman and EP team as planned.  i  Patient is agreeable to this plan and will call if any problems develop in the interim.   Signed: Burtis Junes, RN, ANP-C 10/26/2015 2:06 PM  [facility] [address] [place], [place]  [address] Phone: [phone] Fax: [phone]       Addendum: CT of the Abdomen results noted:  IMPRESSION: Intramuscular hematoma right rectus muscle and internal oblique muscle slightly improved. No new bleeding since prior study.  Constipation   Electronically Signed By: Franchot Gallo M.D. On: 10/26/2015 15:20              Will keep her on the Eliquis. Await Labs. See back as planned.   Burtis Junes, RN, [facility] [address] [place], [place]  [address] [phone]   

## 2015-10-27 LAB — CUP PACEART INCLINIC DEVICE CHECK
Brady Statistic RA Percent Paced: 0 %
Brady Statistic RV Percent Paced: 38 %
Date Time Interrogation Session: 20170626040000
Implantable Lead Implant Date: 20070727
Implantable Lead Location: 753860
Implantable Lead Model: 4135
Implantable Lead Model: 4136
Implantable Lead Serial Number: 24435626
Implantable Lead Serial Number: 24438928
Lead Channel Setting Pacing Amplitude: 2.4 V
Lead Channel Setting Sensing Sensitivity: 2.5 mV
MDC IDC LEAD IMPLANT DT: 20070727
MDC IDC LEAD LOCATION: 753859
MDC IDC PG SERIAL: 785093
MDC IDC SET LEADCHNL RV PACING PULSEWIDTH: 0.8 ms

## 2015-10-27 NOTE — Progress Notes (Signed)
Pacemaker check in clinic for battery only (N/C). Estimated longevity <0.5 years. Patient to f/u with JA on 8/2.

## 2015-11-01 ENCOUNTER — Encounter: Payer: Self-pay | Admitting: Nurse Practitioner

## 2015-11-02 NOTE — Telephone Encounter (Signed)
LM informing son that the appointment has been corrected in our system. I encouraged son to call back if he has any further questions.

## 2015-11-09 ENCOUNTER — Encounter: Payer: Medicare Other | Admitting: Internal Medicine

## 2015-11-10 ENCOUNTER — Inpatient Hospital Stay (HOSPITAL_COMMUNITY)
Admission: EM | Admit: 2015-11-10 | Discharge: 2015-11-16 | DRG: 872 | Disposition: A | Discharge status: Hospice | Payer: Medicare Other | Attending: Family Medicine | Admitting: Family Medicine

## 2015-11-10 ENCOUNTER — Encounter (HOSPITAL_COMMUNITY): Payer: Self-pay | Admitting: General Practice

## 2015-11-10 ENCOUNTER — Emergency Department (HOSPITAL_COMMUNITY): Payer: Medicare Other

## 2015-11-10 DIAGNOSIS — Z8673 Personal history of transient ischemic attack (TIA), and cerebral infarction without residual deficits: Secondary | ICD-10-CM | POA: Diagnosis not present

## 2015-11-10 DIAGNOSIS — Z882 Allergy status to sulfonamides status: Secondary | ICD-10-CM

## 2015-11-10 DIAGNOSIS — E2740 Unspecified adrenocortical insufficiency: Secondary | ICD-10-CM | POA: Diagnosis present

## 2015-11-10 DIAGNOSIS — L03116 Cellulitis of left lower limb: Secondary | ICD-10-CM | POA: Diagnosis present

## 2015-11-10 DIAGNOSIS — Z950 Presence of cardiac pacemaker: Secondary | ICD-10-CM

## 2015-11-10 DIAGNOSIS — L0390 Cellulitis, unspecified: Secondary | ICD-10-CM | POA: Diagnosis present

## 2015-11-10 DIAGNOSIS — I130 Hypertensive heart and chronic kidney disease with heart failure and stage 1 through stage 4 chronic kidney disease, or unspecified chronic kidney disease: Secondary | ICD-10-CM | POA: Diagnosis present

## 2015-11-10 DIAGNOSIS — I5042 Chronic combined systolic (congestive) and diastolic (congestive) heart failure: Secondary | ICD-10-CM | POA: Diagnosis present

## 2015-11-10 DIAGNOSIS — I080 Rheumatic disorders of both mitral and aortic valves: Secondary | ICD-10-CM | POA: Diagnosis present

## 2015-11-10 DIAGNOSIS — I272 Other secondary pulmonary hypertension: Secondary | ICD-10-CM | POA: Diagnosis present

## 2015-11-10 DIAGNOSIS — E039 Hypothyroidism, unspecified: Secondary | ICD-10-CM | POA: Diagnosis present

## 2015-11-10 DIAGNOSIS — B379 Candidiasis, unspecified: Secondary | ICD-10-CM | POA: Diagnosis present

## 2015-11-10 DIAGNOSIS — Z66 Do not resuscitate: Secondary | ICD-10-CM | POA: Diagnosis present

## 2015-11-10 DIAGNOSIS — A419 Sepsis, unspecified organism: Secondary | ICD-10-CM | POA: Diagnosis not present

## 2015-11-10 DIAGNOSIS — I482 Chronic atrial fibrillation: Secondary | ICD-10-CM | POA: Diagnosis present

## 2015-11-10 DIAGNOSIS — H9190 Unspecified hearing loss, unspecified ear: Secondary | ICD-10-CM | POA: Diagnosis present

## 2015-11-10 DIAGNOSIS — E785 Hyperlipidemia, unspecified: Secondary | ICD-10-CM | POA: Diagnosis present

## 2015-11-10 DIAGNOSIS — M069 Rheumatoid arthritis, unspecified: Secondary | ICD-10-CM | POA: Diagnosis present

## 2015-11-10 DIAGNOSIS — F329 Major depressive disorder, single episode, unspecified: Secondary | ICD-10-CM | POA: Diagnosis present

## 2015-11-10 DIAGNOSIS — N179 Acute kidney failure, unspecified: Secondary | ICD-10-CM | POA: Diagnosis not present

## 2015-11-10 DIAGNOSIS — N189 Chronic kidney disease, unspecified: Secondary | ICD-10-CM

## 2015-11-10 DIAGNOSIS — J329 Chronic sinusitis, unspecified: Secondary | ICD-10-CM | POA: Diagnosis present

## 2015-11-10 DIAGNOSIS — I959 Hypotension, unspecified: Secondary | ICD-10-CM | POA: Insufficient documentation

## 2015-11-10 DIAGNOSIS — Z515 Encounter for palliative care: Secondary | ICD-10-CM | POA: Diagnosis present

## 2015-11-10 DIAGNOSIS — Z885 Allergy status to narcotic agent status: Secondary | ICD-10-CM

## 2015-11-10 DIAGNOSIS — K219 Gastro-esophageal reflux disease without esophagitis: Secondary | ICD-10-CM | POA: Diagnosis present

## 2015-11-10 DIAGNOSIS — Z789 Other specified health status: Secondary | ICD-10-CM | POA: Diagnosis not present

## 2015-11-10 DIAGNOSIS — Z853 Personal history of malignant neoplasm of breast: Secondary | ICD-10-CM | POA: Diagnosis not present

## 2015-11-10 DIAGNOSIS — Z86718 Personal history of other venous thrombosis and embolism: Secondary | ICD-10-CM

## 2015-11-10 DIAGNOSIS — B370 Candidal stomatitis: Secondary | ICD-10-CM | POA: Diagnosis present

## 2015-11-10 DIAGNOSIS — Z7952 Long term (current) use of systemic steroids: Secondary | ICD-10-CM | POA: Diagnosis not present

## 2015-11-10 DIAGNOSIS — N183 Chronic kidney disease, stage 3 (moderate): Secondary | ICD-10-CM | POA: Diagnosis present

## 2015-11-10 DIAGNOSIS — Z7901 Long term (current) use of anticoagulants: Secondary | ICD-10-CM

## 2015-11-10 HISTORY — DX: Low back pain, unspecified: M54.50

## 2015-11-10 HISTORY — DX: Acute myocardial infarction, unspecified: I21.9

## 2015-11-10 HISTORY — DX: Other chronic pain: G89.29

## 2015-11-10 HISTORY — DX: Spinal stenosis, lumbar region without neurogenic claudication: M48.061

## 2015-11-10 HISTORY — DX: Unspecified osteoarthritis, unspecified site: M19.90

## 2015-11-10 HISTORY — DX: Personal history of other medical treatment: Z92.89

## 2015-11-10 HISTORY — DX: Major depressive disorder, single episode, unspecified: F32.9

## 2015-11-10 HISTORY — DX: Chronic kidney disease, stage 3 (moderate): N18.3

## 2015-11-10 HISTORY — DX: Presence of cardiac pacemaker: Z95.0

## 2015-11-10 HISTORY — DX: Heart failure, unspecified: I50.9

## 2015-11-10 HISTORY — DX: Acute embolism and thrombosis of unspecified deep veins of unspecified lower extremity: I82.409

## 2015-11-10 HISTORY — DX: Low back pain: M54.5

## 2015-11-10 HISTORY — DX: Depression, unspecified: F32.A

## 2015-11-10 HISTORY — DX: Personal history of other diseases of the digestive system: Z87.19

## 2015-11-10 HISTORY — DX: Chronic kidney disease, stage 3 unspecified: N18.30

## 2015-11-10 LAB — COMPREHENSIVE METABOLIC PANEL
ALT: 18 U/L (ref 14–54)
AST: 30 U/L (ref 15–41)
Albumin: 2.6 g/dL — ABNORMAL LOW (ref 3.5–5.0)
Alkaline Phosphatase: 79 U/L (ref 38–126)
Anion gap: 10 (ref 5–15)
BUN: 20 mg/dL (ref 6–20)
CO2: 28 mmol/L (ref 22–32)
Calcium: 8.6 mg/dL — ABNORMAL LOW (ref 8.9–10.3)
Chloride: 102 mmol/L (ref 101–111)
Creatinine, Ser: 1.28 mg/dL — ABNORMAL HIGH (ref 0.44–1.00)
GFR calc Af Amer: 41 mL/min — ABNORMAL LOW (ref >60)
GFR calc non Af Amer: 35 mL/min — ABNORMAL LOW (ref >60)
Glucose, Bld: 85 mg/dL (ref 65–99)
Potassium: 2.9 mmol/L — ABNORMAL LOW (ref 3.5–5.1)
Sodium: 140 mmol/L (ref 135–145)
Total Bilirubin: 1.1 mg/dL (ref 0.3–1.2)
Total Protein: 5.4 g/dL — ABNORMAL LOW (ref 6.5–8.1)

## 2015-11-10 LAB — CBC WITH DIFFERENTIAL/PLATELET
Basophils Absolute: 0 10*3/uL (ref 0.0–0.1)
Basophils Relative: 0 %
Eosinophils Absolute: 0 10*3/uL (ref 0.0–0.7)
Eosinophils Relative: 0 %
HCT: 34.5 % — ABNORMAL LOW (ref 36.0–46.0)
Hemoglobin: 10.8 g/dL — ABNORMAL LOW (ref 12.0–15.0)
Lymphocytes Relative: 7 %
Lymphs Abs: 1.4 10*3/uL (ref 0.7–4.0)
MCH: 35.4 pg — ABNORMAL HIGH (ref 26.0–34.0)
MCHC: 31.3 g/dL (ref 30.0–36.0)
MCV: 113.1 fL — ABNORMAL HIGH (ref 78.0–100.0)
Monocytes Absolute: 1.2 10*3/uL — ABNORMAL HIGH (ref 0.1–1.0)
Monocytes Relative: 6 %
Neutro Abs: 18 10*3/uL — ABNORMAL HIGH (ref 1.7–7.7)
Neutrophils Relative %: 87 %
Platelets: 139 10*3/uL — ABNORMAL LOW (ref 150–400)
RBC: 3.05 MIL/uL — ABNORMAL LOW (ref 3.87–5.11)
RDW: 21.1 % — ABNORMAL HIGH (ref 11.5–15.5)
WBC Morphology: INCREASED
WBC: 20.6 10*3/uL — ABNORMAL HIGH (ref 4.0–10.5)

## 2015-11-10 LAB — URINALYSIS, ROUTINE W REFLEX MICROSCOPIC
Bilirubin Urine: NEGATIVE
Glucose, UA: NEGATIVE mg/dL
Hgb urine dipstick: NEGATIVE
Ketones, ur: NEGATIVE mg/dL
Leukocytes, UA: NEGATIVE
Nitrite: NEGATIVE
Protein, ur: NEGATIVE mg/dL
Specific Gravity, Urine: 1.012 (ref 1.005–1.030)
pH: 6.5 (ref 5.0–8.0)

## 2015-11-10 LAB — I-STAT CG4 LACTIC ACID, ED: Lactic Acid, Venous: 4.82 mmol/L (ref 0.5–1.9)

## 2015-11-10 MED ORDER — SUCRALFATE 1 GM/10ML PO SUSP
1.0000 g | Freq: Two times a day (BID) | ORAL | Status: DC
  Filled 2015-11-10: qty 10

## 2015-11-10 MED ORDER — MAGNESIUM OXIDE 400 (241.3 MG) MG PO TABS: 400.0000 mg | ORAL_TABLET | Freq: Every day | ORAL | Status: DC

## 2015-11-10 MED ORDER — METOPROLOL SUCCINATE ER 50 MG PO TB24: 50.0000 mg | ORAL_TABLET | Freq: Two times a day (BID) | ORAL | Status: DC

## 2015-11-10 MED ORDER — SODIUM CHLORIDE 0.9 % IV BOLUS (SEPSIS)
1000.0000 mL | Freq: Once | INTRAVENOUS | Status: AC
  Administered 2015-11-10: 1000 mL via INTRAVENOUS

## 2015-11-10 MED ORDER — VANCOMYCIN HCL 500 MG IV SOLR
500.0000 mg | INTRAVENOUS | Status: DC
  Filled 2015-11-10: qty 500

## 2015-11-10 MED ORDER — PIPERACILLIN-TAZOBACTAM 3.375 G IVPB
3.3750 g | Freq: Three times a day (TID) | INTRAVENOUS | Status: DC
  Administered 2015-11-10 – 2015-11-11 (×2): 3.375 g via INTRAVENOUS
  Filled 2015-11-10 (×4): qty 50

## 2015-11-10 MED ORDER — POLYVINYL ALCOHOL 1.4 % OP SOLN: 1.0000 [drp] | Freq: Four times a day (QID) | OPHTHALMIC | Status: DC | PRN

## 2015-11-10 MED ORDER — PIPERACILLIN-TAZOBACTAM 3.375 G IVPB 30 MIN
3.3750 g | Freq: Once | INTRAVENOUS | Status: AC
  Administered 2015-11-10: 3.375 g via INTRAVENOUS
  Filled 2015-11-10: qty 50

## 2015-11-10 MED ORDER — GABAPENTIN 400 MG PO CAPS
800.0000 mg | ORAL_CAPSULE | Freq: Every day | ORAL | Status: DC
  Filled 2015-11-10: qty 2

## 2015-11-10 MED ORDER — BIOTENE DRY MOUTH MT LIQD: 15.0000 mL | OROMUCOSAL | Status: DC | PRN

## 2015-11-10 MED ORDER — LEVOTHYROXINE SODIUM 50 MCG PO TABS
50.0000 ug | ORAL_TABLET | Freq: Every day | ORAL | Status: DC
  Filled 2015-11-10: qty 1

## 2015-11-10 MED ORDER — SODIUM CHLORIDE 0.9 % IV BOLUS (SEPSIS)
250.0000 mL | Freq: Once | INTRAVENOUS | Status: AC
  Administered 2015-11-10: 250 mL via INTRAVENOUS

## 2015-11-10 MED ORDER — FUROSEMIDE 40 MG PO TABS: 40.0000 mg | ORAL_TABLET | Freq: Every day | ORAL | Status: DC

## 2015-11-10 MED ORDER — FENTANYL 50 MCG/HR TD PT72: 75.0000 ug | MEDICATED_PATCH | TRANSDERMAL | Status: DC

## 2015-11-10 MED ORDER — LORAZEPAM 2 MG/ML IJ SOLN: 0.5000 mg | INTRAMUSCULAR | Status: DC | PRN

## 2015-11-10 MED ORDER — CITALOPRAM HYDROBROMIDE 20 MG PO TABS: 20.0000 mg | ORAL_TABLET | Freq: Every day | ORAL | Status: DC

## 2015-11-10 MED ORDER — ACETAMINOPHEN 650 MG RE SUPP
650.0000 mg | Freq: Once | RECTAL | Status: AC
  Administered 2015-11-10: 650 mg via RECTAL
  Filled 2015-11-10: qty 1

## 2015-11-10 MED ORDER — ONDANSETRON HCL 4 MG/2ML IJ SOLN
4.0000 mg | Freq: Four times a day (QID) | INTRAMUSCULAR | Status: DC | PRN
  Administered 2015-11-10 – 2015-11-11 (×2): 4 mg via INTRAVENOUS
  Filled 2015-11-10: qty 2

## 2015-11-10 MED ORDER — GLYCOPYRROLATE 0.2 MG/ML IJ SOLN: 0.2000 mg | INTRAMUSCULAR | Status: DC | PRN

## 2015-11-10 MED ORDER — FLUCONAZOLE IN SODIUM CHLORIDE 100-0.9 MG/50ML-% IV SOLN
100.0000 mg | INTRAVENOUS | Status: DC
  Administered 2015-11-10: 100 mg via INTRAVENOUS
  Filled 2015-11-10 (×3): qty 50

## 2015-11-10 MED ORDER — VANCOMYCIN HCL IN DEXTROSE 1-5 GM/200ML-% IV SOLN
1000.0000 mg | Freq: Once | INTRAVENOUS | Status: AC
  Administered 2015-11-10: 1000 mg via INTRAVENOUS
  Filled 2015-11-10: qty 200

## 2015-11-10 MED ORDER — ONDANSETRON 4 MG PO TBDP: 4.0000 mg | ORAL_TABLET | Freq: Four times a day (QID) | ORAL | Status: DC | PRN

## 2015-11-10 MED ORDER — SENNA 8.6 MG PO TABS
1.0000 | ORAL_TABLET | Freq: Every evening | ORAL | Status: DC | PRN
Start: 2015-11-10 — End: 2015-11-11

## 2015-11-10 MED ORDER — ONDANSETRON HCL 4 MG/2ML IJ SOLN
INTRAMUSCULAR | Status: AC
Start: 2015-11-10 — End: 2015-11-10
  Filled 2015-11-10: qty 2

## 2015-11-10 MED ORDER — LOPERAMIDE HCL 2 MG PO CAPS: 2.0000 mg | ORAL_CAPSULE | Freq: Four times a day (QID) | ORAL | Status: DC | PRN

## 2015-11-10 MED ORDER — ACETAMINOPHEN 650 MG RE SUPP: 650.0000 mg | Freq: Four times a day (QID) | RECTAL | Status: DC | PRN

## 2015-11-10 MED ORDER — ACETAMINOPHEN 325 MG PO TABS: 650.0000 mg | ORAL_TABLET | Freq: Four times a day (QID) | ORAL | Status: DC | PRN

## 2015-11-10 MED ORDER — GLYCOPYRROLATE 0.2 MG/ML IJ SOLN
0.2000 mg | INTRAMUSCULAR | Status: DC | PRN
  Administered 2015-11-14 – 2015-11-16 (×8): 0.2 mg via INTRAVENOUS
  Filled 2015-11-10 (×8): qty 1

## 2015-11-10 MED ORDER — HYDROCORTISONE NA SUCCINATE PF 100 MG IJ SOLR
50.0000 mg | Freq: Four times a day (QID) | INTRAMUSCULAR | Status: DC
  Administered 2015-11-10 – 2015-11-11 (×3): 50 mg via INTRAVENOUS
  Filled 2015-11-10 (×3): qty 2

## 2015-11-10 MED ORDER — ISOSORBIDE MONONITRATE 15 MG HALF TABLET
15.0000 mg | ORAL_TABLET | Freq: Every day | ORAL | Status: DC
  Filled 2015-11-10: qty 1

## 2015-11-10 MED ORDER — FENTANYL CITRATE (PF) 100 MCG/2ML IJ SOLN
50.0000 ug | Freq: Once | INTRAMUSCULAR | Status: AC
  Administered 2015-11-10: 50 ug via INTRAVENOUS
  Filled 2015-11-10: qty 2

## 2015-11-10 MED ORDER — HYDROMORPHONE HCL 1 MG/ML IJ SOLN
2.0000 mg | INTRAMUSCULAR | Status: DC | PRN
  Administered 2015-11-10 – 2015-11-16 (×4): 2 mg via INTRAVENOUS
  Filled 2015-11-10 (×3): qty 2

## 2015-11-10 MED ORDER — PANTOPRAZOLE SODIUM 40 MG PO TBEC
40.0000 mg | DELAYED_RELEASE_TABLET | Freq: Two times a day (BID) | ORAL | Status: DC
  Filled 2015-11-10: qty 1

## 2015-11-10 MED ORDER — ONDANSETRON HCL 4 MG/2ML IJ SOLN
4.0000 mg | Freq: Once | INTRAMUSCULAR | Status: AC
  Administered 2015-11-10: 4 mg via INTRAVENOUS
  Filled 2015-11-10: qty 2

## 2015-11-10 MED ORDER — GLYCOPYRROLATE 1 MG PO TABS: 1.0000 mg | ORAL_TABLET | ORAL | Status: DC | PRN

## 2015-11-10 NOTE — ED Notes (Signed)
Attempted report x 2 

## 2015-11-10 NOTE — ED Notes (Signed)
Palliative care at bedside.

## 2015-11-10 NOTE — Progress Notes (Signed)
Pt admitted to 6N24 via stretcher from ED.  Pt alert but not responding to verbal commands.  Pt on 3L O2 via [place].  Pt has 20G to Lt AC SL and 22 to Rt UA SL.  Pt has skin tears to BUE and LLE with gauze and foam dressing, Pt has cellulitis to BLE.  Report rcvd from [place], [place].  Family to bedside.  Will continue to monitor.

## 2015-11-10 NOTE — Progress Notes (Addendum)
Palliative Medicine RN Note: Rec'd consult on Nicole Roberts from ED PA. Saw pt in [facility] and met with son [phone]) and daughter in law [phone]). Pt c/o back pain; repositioned by ED RN, who was also about to administer fentanyl IV.  Met with family. Son accurately described pt's condition and the treatment options offered by ED providers. He states that while comfort is the goal, they would like to give the pt a chance to respond to IV abx. Family states that if we give her a chance, then either her survival or death will be God's choice; they state they have been lucky to have her this long and that they are as ready as they can be should she not survive. Son understands that we cannot give IVF as we normally would based on pt's existing renal and cardiac issues. He states that intubation is not acceptable under any circumstances and does not want the pt progressed to bipap or a mask.   Further questioning reveals that the family prefers comfort if a choice has to be made (i.e. She gets SOB from IVF or starts to have pain that is only relieved by sedation). Pt's previously described acceptable QOL is being able to stand using a bar (which will allow her to return to ALF at [facility]); should she reach a point that it is felt she will not return to this level of functioning, family would no longer want to pursue curative options, as pt has stated a nursing home is completely unacceptable. Son states that stress level is worsened d/t pt's husband's scheduled interment in [place] on 7/13.  Pt has been on long-term steroids for RA and long-term Fentanyl patch for RA. Reviewed allergies with family. Discussed case with Dr Douglass Rivers with PMT; she initiated comfort orders that take into account the desire for abx therapy with no escalation of care. Also discussed pt with [facility] admitting team in ED. Plan to admit and continue Dr Delanna Ahmadi orders. PMT will follow daily and prn. Chaplain visiting with pt's  family.  Thank you for the invitation to participate in this patient's care.  Marjie Skiff Traeh Milroy, RN, BSN, [facility] 11/10/2015 2:45 PM Cell [phone] 8:00-4:00 Monday-Friday Office [phone]

## 2015-11-10 NOTE — Progress Notes (Signed)
[place] visited patient in trauma A as recommended by care giver. Patients son was at bedside. Provided the ministry of presence, listening, and prayer. I am available for follow up as needed.  Darene Lamer [place]    11/10/15 1400  Clinical Encounter Type  Visited With Patient and family together  Visit Type Initial;Spiritual support;ED  Spiritual Encounters  Spiritual Needs Prayer;Emotional

## 2015-11-10 NOTE — H&P (Signed)
History and Physical    [name] [id number] DOB: 01/17/1922 DOA: 11/10/2015  PCP: [name], MD  Patient coming from:  Nursing  Home    Chief Complaint: left leg red, swollen. Low BP  HPI: Nicole Roberts is a 82 y.o. female with medical history significant for but not  limited to, hypertension,  stage III chronic kidney disease and chronic combined systolic and diastolic heart failure. Patient admitted mid-June with left lower extremity cellulitis refractory to outpatient management .  she had acute on chronic back pain as well, CT scan showed a sacral fracture and patient underwent bilateral sacroplasty by IR. Following that patient developed a RLE DVT.  She was started on Heparin but developed a large rectus sheath hematoma requiring an IVC filter. Patient was discharged on Augmentin 6/16.   Family received a call from nursing home today. Patient was confused , hypotensive. Daughter- in-law noticed left lower extremity cellulitis has become much worse.  Patient was rolled to the emergency department where she was found to be septic.   ED Course:   temp 101 , patient is hypertensive, tachypneic and tachycardic.  WBC 20.6 Lactic acid 4.82 Acute kidney injury with creatinine 1.28 2500 mL normal saline bolus Meds in ED: Tylenol suppository IV fentanyl, Solu-Cortef, IV Zofran, Zosyn, IV Vanco   Review of Systems: Not obtained secondary to confusion / lethargy.   Past Medical History  Diagnosis Date  . HEARING LOSS   . MITRAL VALVE INSUFF&AORTIC VALVE INSUFF     moderate MR  . PULMONARY HYPERTENSION   . Atrial fibrillation ([place])     permanent  . SICK SINUS SYNDROME     a. Tachybrady syndrome - Guidant PPM 10/2005.  . Edema 03/19/2009    due to venous insufficiency, chronic lymphedema  . URINARY INCONTINENCE   . Rheumatoid arthritis(714.0) dx clarified 2011    On prednisone  . BREAST CANCER 12/2006    ductal ca s/p L lumpectomy  . Diastolic dysfunction   .  HYPERTENSION   . HYPERLIPIDEMIA   . GERD   . HYPOTHYROIDISM   . Hyperparathyroidism     2 lobes removed  . Venous insufficiency     chronic BLE edema  . Spinal stenosis   . Anemia     a. Macrocytic - normal B12, folate 08/2011. b. 3/6 heme positive stools 10/2011 - per PCP note, elected against colonscopy.  [name] TIA (transient ischemic attack) 05/2011  . Anxiety     Past Surgical History  Procedure Laterality Date  . Pacemaker placement  2005    SSS and syncope  . Cholecystectomy  04/06/99  . Abdominal hysterectomy  1970    Partial  . Left hip arthroscopy  1995  . Right hip arthroscopy  01/2001  . Carpal tunnel release  1998    bilateral  . Breast surgery  12/2006    Left breast lupectomy- Ductal CA  . Left parathyroidectomy  07/2008  . Esophagogastroduodenoscopy N/A 10/18/2012    Procedure: ESOPHAGOGASTRODUODENOSCOPY (EGD);  Surgeon: Ladene Artist, MD;  Location: [facility];  Service: Endoscopy;  Laterality: N/A;  . Esophagogastroduodenoscopy (egd) with propofol N/A 01/29/2015    Procedure: ESOPHAGOGASTRODUODENOSCOPY (EGD) WITH PROPOFOL;  Surgeon: Inda Castle, MD;  Location: [facility];  Service: Endoscopy;  Laterality: N/A;  . Ercp N/A 01/29/2015    Procedure: ENDOSCOPIC RETROGRADE CHOLANGIOPANCREATOGRAPHY (ERCP);  Surgeon: Inda Castle, MD;  Location: [facility];  Service: Endoscopy;  Laterality: N/A;    Social History   Social  History  . Marital Status: Married    Spouse Name: N/A  . Number of Children: 6  . Years of Education: N/A   Occupational History  . Retired Agricultural engineer    Social History Main Topics  . Smoking status: Never Smoker   . Smokeless tobacco: Never Used  . Alcohol Use: No  . Drug Use: No  . Sexual Activity: Not Currently   Other Topics Concern  . Not on file   Social History Narrative   Lives at [facility] since 03/2009- married, lives with spouse. Moved here from [facility] to be near son    Allergies  Allergen  Reactions  . Sulfa Antibiotics Itching  . Tape     Only tolerates paper tape. All others tear the skin  . Tramadol Hcl Itching and Rash    Family History  Problem Relation Age of Onset  . Ovarian cancer Mother   . Coronary artery disease Sister   . Coronary artery disease Brother   . Hypertension Mother     grandparent  . Lung cancer Father    Abran Roberts , currently residing in long-term care facility   Prior to Admission medications   Medication Sig Start Date End Date Taking? Authorizing Provider  acetaminophen (TYLENOL) 500 MG tablet Take 2 tablets (1,000 mg total) by mouth every 8 (eight) hours. X 3 days 10/15/15   Ripudeep Krystal Eaton, MD  amoxicillin-clavulanate (AUGMENTIN) 875-125 MG tablet Take 1 tablet by mouth 2 (two) times daily. 10/15/15   Ripudeep Krystal Eaton, MD  apixaban (ELIQUIS) 2.5 MG TABS tablet Take 1 tablet (2.5 mg total) by mouth 2 (two) times daily. tentative starting date 10/21/15 but will need MD to reassess abdominal hematoma prior to starting eliquis Patient not taking: Reported on 10/26/2015 10/21/15   Ripudeep Krystal Eaton, MD  calcium citrate-vitamin D (CITRACAL+D) 315-200 MG-UNIT tablet Take 1 tablet by mouth 2 (two) times daily. Patient taking differently: Take 1 tablet by mouth 2 (two) times daily. Gummies 08/24/15   [name], MD  cholecalciferol (VITAMIN D) 1000 UNITS tablet Take 1,000 Units by mouth daily.    Historical Provider, MD  citalopram (CELEXA) 20 MG tablet Take 1 tablet (20 mg total) by mouth daily. 10/15/15   Ripudeep Krystal Eaton, MD  conjugated estrogens (PREMARIN) vaginal cream Place 1 Applicatorful vaginally 3 (three) times a week.    Historical Provider, MD  fentaNYL (DURAGESIC - DOSED MCG/HR) 75 MCG/HR Place 1 patch (75 mcg total) onto the skin every 3 (three) days. 10/15/15   Ripudeep Krystal Eaton, MD  folic acid (FOLVITE) A999333 MCG tablet Take 400 mcg by mouth every morning.    Historical Provider, MD  furosemide (LASIX) 20 MG tablet Take 2 tablets (40 mg total)  by mouth daily. Until directed to decrease back to 20 mg daily 03/18/15   [name], MD  gabapentin (NEURONTIN) 400 MG capsule Take 2 capsules (800 mg total) by mouth at bedtime. 12/02/14   Rowe Clack, MD  HYDROcodone-acetaminophen (NORCO/VICODIN) 5-325 MG tablet Take 1 tablet by mouth every 6 (six) hours as needed for severe pain. 10/15/15   Ripudeep Krystal Eaton, MD  isosorbide mononitrate (IMDUR) 30 MG 24 hr tablet Take 15 mg by mouth daily.    Historical Provider, MD  levothyroxine (SYNTHROID, LEVOTHROID) 50 MCG tablet Take 1 tablet (50 mcg total) by mouth daily. Patient taking differently: Take 50 mcg by mouth daily before breakfast.  06/09/14   Rowe Clack, MD  loperamide (IMODIUM) 2  MG capsule Take 1 capsule (2 mg total) by mouth every 6 (six) hours as needed for diarrhea or loose stools. 10/15/15   Ripudeep Krystal Eaton, MD  magnesium oxide (MAG-OX) 400 MG tablet Take 400 mg by mouth daily.    Historical Provider, MD  metoprolol succinate (TOPROL-XL) 50 MG 24 hr tablet Take 1 tablet (50 mg total) by mouth 2 (two) times daily. Take with or immediately following a meal. 06/02/14   Rowe Clack, MD  Multiple Vitamin (DAILY-VITE) TABS TAKE ONE TABLET BY MOUTH ONCE DAILY. 07/17/15   [name], MD  nitroGLYCERIN (NITROSTAT) 0.4 MG SL tablet Place 0.4 mg under the tongue every 5 (five) minutes x 3 doses as needed for chest pain. 03/20/12   Roger A Arguello, PA-C  nystatin (MYCOSTATIN) 100000 UNIT/ML suspension Take 5 mLs (500,000 Units total) by mouth 4 (four) times daily. Use for two days after symptoms have resolved and then stop 09/21/15   [name], MD  pantoprazole (PROTONIX) 40 MG tablet Take 1 tablet (40 mg total) by mouth 2 (two) times daily. 08/24/15   [name], MD  potassium chloride SA (K-DUR,KLOR-CON) 20 MEQ tablet TAKE ONE TABLET BY MOUTH ONCE DAILY. 06/26/15   [name], MD  predniSONE (DELTASONE) 5 MG tablet Take 5 mg by mouth daily with breakfast.    Historical  Provider, MD  sucralfate (CARAFATE) 1 GM/10ML suspension Take 1 g by mouth 2 (two) times daily.    Historical Provider, MD  vitamin B-12 (CYANOCOBALAMIN) 500 MCG tablet TAKE ONE TABLET BY MOUTH ONCE DAILY. 11/20/13   Rowe Clack, MD    Physical Exam: Filed Vitals:   11/10/15 1315 11/10/15 1345 11/10/15 1430 11/10/15 1500  BP: 65/38 79/57 75/43  68/43  Pulse:  101 28   Temp: 100.4 F (38 C) 100.2 F (37.9 C) 99.7 F (37.6 C) 99.5 F (37.5 C)  Resp: 27 36 28 24  Height:      Weight:      SpO2:  98% 87%     Constitutional:  White female in NAD, calm,  Filed Vitals:   11/10/15 1315 11/10/15 1345 11/10/15 1430 11/10/15 1500  BP: 65/38 79/57 75/43  68/43  Pulse:  101 28   Temp: 100.4 F (38 C) 100.2 F (37.9 C) 99.7 F (37.6 C) 99.5 F (37.5 C)  Resp: 27 36 28 24  Height:      Weight:      SpO2:  98% 87%    Eyes: PER, lids and conjunctivae normal ENMT: Mucous membranes slightly dry.  Small amount of white coating on tongue.   Neck: normal, supple, no masses Respiratory: clear to auscultation bilaterally, no wheezing, no crackles. Normal respiratory effort. No accessory muscle use.  Cardiovascular:  sinus tachycardia . No rubs / gallops. No extremity edema. 2+ pedal pulses.   Abdomen: RLQ tenderness where there is a firm round mass (size of orange). . Bowel sounds positive.  Musculoskeletal: no clubbing / cyanosis, no contractures.   Skin: Distal LLE markedly erythematous nearly up to level of knee with 2-3+ edema of the area. Mild RLE edema. . Neurologic: Patient opens eyes periodically. She follows commands.  Psychiatric: Cooperative, NAD.    Labs on Admission: I have personally reviewed following labs and imaging studies  Urine analysis:    Component Value Date/Time   COLORURINE YELLOW 11/10/2015 [place] 11/10/2015 1217   LABSPEC 1.012 11/10/2015 1217   PHURINE 6.5 11/10/2015 1217   GLUCOSEU  NEGATIVE 11/10/2015 1217   GLUCOSEU NEGATIVE  03/18/2015 1643   HGBUR NEGATIVE 11/10/2015 1217   HGBUR large 01/26/2010 1104   BILIRUBINUR NEGATIVE 11/10/2015 1217   KETONESUR NEGATIVE 11/10/2015 1217   PROTEINUR NEGATIVE 11/10/2015 1217   UROBILINOGEN 0.2 03/18/2015 1643   NITRITE NEGATIVE 11/10/2015 1217   LEUKOCYTESUR NEGATIVE 11/10/2015 1217    Radiological Exams on Admission: Dg Chest Portable 1 View  11/10/2015  CLINICAL DATA:  82 year old female with a history of sepsis EXAM: PORTABLE CHEST 1 VIEW COMPARISON:  01/23/2015 FINDINGS: Cardiomediastinal silhouette unchanged in size and contour. Unchanged position of cardiac pacing device on left chest wall. Two leads in place. Overlying EKG leads. Low lung volumes without confluent airspace disease. Coarsened interstitial markings, similar pattern to the comparison. No pneumothorax or pleural effusion. Degenerative changes of the shoulders. No displaced fracture.  Osteopenia. IMPRESSION: Chronic lung changes without evidence of pneumonia. Cardiac pacing device on the left chest wall is unchanged. Signed, Dulcy Fanny. Earleen Newport, DO [facility] Electronically Signed   By: Corrie Mckusick D.O.   On: 11/10/2015 11:59    Assessment/Plan   Active Problems:   Sepsis ([place])      Sepsis secondary to LLE cellulitis -Admit to Medical bed.  -IV Zosyn / Vanco per cellulitis sepsis set  -Appreciate Palliative Medicine's help.             -IVF -Resume home medications if / when able. Holding Toprol, lasix and Imdur today due to hypotension -Symptom management per Palliative Care team -No further blood draws     Candidiasis -Diflucan ordered by Palliative Med  AKI superimposed on CKD 3,  Cr 1.28, up from 1.05 last admission Hold home lasix today  Recent large rectus sheath hematoma on anticoagulation. Anticoagulants discontinued  Chronic combined systolic and diastolic heart failure.  - holding lasix, beta blocker and imdur secondary to  hypotension.   Chronic back pain. On chronic fentanyl patch  Rheumatoid arthritis, on chronic prednisone. Received dose of solucortef in ED -continue solucortef until able to resume home prednisone   Depression.  --Continue home Celexa when tolerating PO  Hypothyroidism.  . -continue home synthroid in am if tolerating PO  DVT prophylaxis:   None. Has lower ext cellulitis and not anticoagulation candidate Code Status:    DNR Family Communication:  Plan of care discussed with Son and Daughter-in-law. They understand and agree with plans .  Disposition Plan:  Discharge back to nursing home in 2-3 days (if she survives septic event).              Consults called:   Palliative Care  Admission status:  Admission - Medical bed    Tye Savoy NP [facility] Pager [phone]  If 7PM-7AM, please contact night-coverage [email or web address] Password [facility]  11/10/2015, 3:17 PM

## 2015-11-10 NOTE — ED Provider Notes (Signed)
CSN: [id number]     Arrival date & time    History   None    Chief Complaint  Patient presents with  . Code Sepsis  . Cellulitis  . Altered Mental Status    HPI 05 caveat due to altered mental status  82 year old female presents today via EMS from [facility]. Patient's daughter is at bedside reports that facility called her out as her mother was hypotensive and had a fever. Daughter notes extending infection in her left lower leg, and requested to have the patient transferred to the emergency room.  EMS notes in route she was hypotensive 70/30.   Past Medical History  Diagnosis Date  . HEARING LOSS   . MITRAL VALVE INSUFF&AORTIC VALVE INSUFF     moderate MR  . PULMONARY HYPERTENSION   . Atrial fibrillation ([place])     permanent  . SICK SINUS SYNDROME     a. Tachybrady syndrome - Guidant PPM 10/2005.  . Edema 03/19/2009    due to venous insufficiency, chronic lymphedema  . URINARY INCONTINENCE   . Rheumatoid arthritis(714.0) dx clarified 2011    On prednisone  . BREAST CANCER 12/2006    ductal ca s/p L lumpectomy  . Diastolic dysfunction   . HYPERTENSION   . HYPERLIPIDEMIA   . GERD   . HYPOTHYROIDISM   . Hyperparathyroidism     2 lobes removed  . Venous insufficiency     chronic BLE edema  . Spinal stenosis   . Anemia     a. Macrocytic - normal B12, folate 08/2011. b. 3/6 heme positive stools 10/2011 - per PCP note, elected against colonscopy.  [name] TIA (transient ischemic attack) 05/2011  . Anxiety    Past Surgical History  Procedure Laterality Date  . Pacemaker placement  2005    SSS and syncope  . Cholecystectomy  04/06/99  . Abdominal hysterectomy  1970    Partial  . Left hip arthroscopy  1995  . Right hip arthroscopy  01/2001  . Carpal tunnel release  1998    bilateral  . Breast surgery  12/2006    Left breast lupectomy- Ductal CA  . Left parathyroidectomy  07/2008  . Esophagogastroduodenoscopy N/A 10/18/2012    Procedure: ESOPHAGOGASTRODUODENOSCOPY (EGD);   Surgeon: Ladene Artist, MD;  Location: [facility];  Service: Endoscopy;  Laterality: N/A;  . Esophagogastroduodenoscopy (egd) with propofol N/A 01/29/2015    Procedure: ESOPHAGOGASTRODUODENOSCOPY (EGD) WITH PROPOFOL;  Surgeon: Inda Castle, MD;  Location: [facility];  Service: Endoscopy;  Laterality: N/A;  . Ercp N/A 01/29/2015    Procedure: ENDOSCOPIC RETROGRADE CHOLANGIOPANCREATOGRAPHY (ERCP);  Surgeon: Inda Castle, MD;  Location: [facility];  Service: Endoscopy;  Laterality: N/A;   Family History  Problem Relation Age of Onset  . Ovarian cancer Mother   . Coronary artery disease Sister   . Coronary artery disease Brother   . Hypertension Mother     grandparent  . Lung cancer Father    Social History  Substance Use Topics  . Smoking status: Never Smoker   . Smokeless tobacco: Never Used  . Alcohol Use: No   OB History    No data available     Review of Systems  All other systems reviewed and are negative.   Allergies  Sulfa antibiotics; Tape; and Tramadol hcl  Home Medications   Prior to Admission medications   Medication Sig Start Date End Date Taking? Authorizing Provider  acetaminophen (TYLENOL) 500 MG tablet Take 2 tablets (1,000 mg  total) by mouth every 8 (eight) hours. X 3 days 10/15/15   Ripudeep Krystal Eaton, MD  amoxicillin-clavulanate (AUGMENTIN) 875-125 MG tablet Take 1 tablet by mouth 2 (two) times daily. 10/15/15   Ripudeep Krystal Eaton, MD  apixaban (ELIQUIS) 2.5 MG TABS tablet Take 1 tablet (2.5 mg total) by mouth 2 (two) times daily. tentative starting date 10/21/15 but will need MD to reassess abdominal hematoma prior to starting eliquis Patient not taking: Reported on 10/26/2015 10/21/15   Ripudeep Krystal Eaton, MD  calcium citrate-vitamin D (CITRACAL+D) 315-200 MG-UNIT tablet Take 1 tablet by mouth 2 (two) times daily. Patient taking differently: Take 1 tablet by mouth 2 (two) times daily. Gummies 08/24/15   [name], MD  cholecalciferol (VITAMIN D) 1000 UNITS  tablet Take 1,000 Units by mouth daily.    Historical Provider, MD  citalopram (CELEXA) 20 MG tablet Take 1 tablet (20 mg total) by mouth daily. 10/15/15   Ripudeep Krystal Eaton, MD  conjugated estrogens (PREMARIN) vaginal cream Place 1 Applicatorful vaginally 3 (three) times a week.    Historical Provider, MD  fentaNYL (DURAGESIC - DOSED MCG/HR) 75 MCG/HR Place 1 patch (75 mcg total) onto the skin every 3 (three) days. 10/15/15   Ripudeep Krystal Eaton, MD  folic acid (FOLVITE) A999333 MCG tablet Take 400 mcg by mouth every morning.    Historical Provider, MD  furosemide (LASIX) 20 MG tablet Take 2 tablets (40 mg total) by mouth daily. Until directed to decrease back to 20 mg daily 03/18/15   [name], MD  gabapentin (NEURONTIN) 400 MG capsule Take 2 capsules (800 mg total) by mouth at bedtime. 12/02/14   Rowe Clack, MD  HYDROcodone-acetaminophen (NORCO/VICODIN) 5-325 MG tablet Take 1 tablet by mouth every 6 (six) hours as needed for severe pain. 10/15/15   Ripudeep Krystal Eaton, MD  isosorbide mononitrate (IMDUR) 30 MG 24 hr tablet Take 15 mg by mouth daily.    Historical Provider, MD  levothyroxine (SYNTHROID, LEVOTHROID) 50 MCG tablet Take 1 tablet (50 mcg total) by mouth daily. Patient taking differently: Take 50 mcg by mouth daily before breakfast.  06/09/14   Rowe Clack, MD  loperamide (IMODIUM) 2 MG capsule Take 1 capsule (2 mg total) by mouth every 6 (six) hours as needed for diarrhea or loose stools. 10/15/15   Ripudeep Krystal Eaton, MD  magnesium oxide (MAG-OX) 400 MG tablet Take 400 mg by mouth daily.    Historical Provider, MD  metoprolol succinate (TOPROL-XL) 50 MG 24 hr tablet Take 1 tablet (50 mg total) by mouth 2 (two) times daily. Take with or immediately following a meal. 06/02/14   Rowe Clack, MD  Multiple Vitamin (DAILY-VITE) TABS TAKE ONE TABLET BY MOUTH ONCE DAILY. 07/17/15   [name], MD  nitroGLYCERIN (NITROSTAT) 0.4 MG SL tablet Place 0.4 mg under the tongue every 5 (five) minutes x 3  doses as needed for chest pain. 03/20/12   Roger A Arguello, PA-C  nystatin (MYCOSTATIN) 100000 UNIT/ML suspension Take 5 mLs (500,000 Units total) by mouth 4 (four) times daily. Use for two days after symptoms have resolved and then stop 09/21/15   [name], MD  pantoprazole (PROTONIX) 40 MG tablet Take 1 tablet (40 mg total) by mouth 2 (two) times daily. 08/24/15   [name], MD  potassium chloride SA (K-DUR,KLOR-CON) 20 MEQ tablet TAKE ONE TABLET BY MOUTH ONCE DAILY. 06/26/15   [name], MD  predniSONE (DELTASONE) 5 MG tablet Take  5 mg by mouth daily with breakfast.    Historical Provider, MD  sucralfate (CARAFATE) 1 GM/10ML suspension Take 1 g by mouth 2 (two) times daily.    Historical Provider, MD  vitamin B-12 (CYANOCOBALAMIN) 500 MCG tablet TAKE ONE TABLET BY MOUTH ONCE DAILY. 11/20/13   Rowe Clack, MD   BP 68/43 mmHg  Pulse 42  Temp(Src) 99.5 F (37.5 C)  Resp 21  Ht 5\' 4"  (1.626 m)  Wt 68.9 kg  BMI 26.06 kg/m2  SpO2 86%   Physical Exam  Constitutional: She appears distressed.  Pulmonary/Chest:  Tachypnea  Abdominal: Soft. She exhibits no distension.  Skin: Skin is dry. Rash noted. She is not diaphoretic. There is erythema.  Significant cellulitis to majority anterior surface of the left lower extremity.  Nursing note and vitals reviewed.   ED Course  Procedures (including critical care time)  CRITICAL CARE Performed by: Elmer Ramp   Total critical care time: 45 minutes  Critical care time was exclusive of separately billable procedures and treating other patients.  Critical care was necessary to treat or prevent imminent or life-threatening deterioration.  Critical care was time spent personally by me on the following activities: development of treatment plan with patient and/or surrogate as well as nursing, discussions with consultants, evaluation of patient's response to treatment, examination of patient, obtaining history from patient  or surrogate, ordering and performing treatments and interventions, ordering and review of laboratory studies, ordering and review of radiographic studies, pulse oximetry and re-evaluation of patient's condition. Labs Review Labs Reviewed  COMPREHENSIVE METABOLIC PANEL - Abnormal; Notable for the following:    Potassium 2.9 (*)    Creatinine, Ser 1.28 (*)    Calcium 8.6 (*)    Total Protein 5.4 (*)    Albumin 2.6 (*)    GFR calc non Af Amer 35 (*)    GFR calc Af Amer 41 (*)    All other components within normal limits  CBC WITH DIFFERENTIAL/PLATELET - Abnormal; Notable for the following:    WBC 20.6 (*)    RBC 3.05 (*)    Hemoglobin 10.8 (*)    HCT 34.5 (*)    MCV 113.1 (*)    MCH 35.4 (*)    RDW 21.1 (*)    Platelets 139 (*)    Neutro Abs 18.0 (*)    Monocytes Absolute 1.2 (*)    All other components within normal limits  I-STAT CG4 LACTIC ACID, ED - Abnormal; Notable for the following:    Lactic Acid, Venous 4.82 (*)    All other components within normal limits  CULTURE, BLOOD (ROUTINE X 2)  CULTURE, BLOOD (ROUTINE X 2)  URINE CULTURE  URINALYSIS, ROUTINE W REFLEX MICROSCOPIC (NOT AT [facility])    Imaging Review Dg Chest Portable 1 View  11/10/2015  CLINICAL DATA:  82 year old female with a history of sepsis EXAM: PORTABLE CHEST 1 VIEW COMPARISON:  01/23/2015 FINDINGS: Cardiomediastinal silhouette unchanged in size and contour. Unchanged position of cardiac pacing device on left chest wall. Two leads in place. Overlying EKG leads. Low lung volumes without confluent airspace disease. Coarsened interstitial markings, similar pattern to the comparison. No pneumothorax or pleural effusion. Degenerative changes of the shoulders. No displaced fracture.  Osteopenia. IMPRESSION: Chronic lung changes without evidence of pneumonia. Cardiac pacing device on the left chest wall is unchanged. Signed, Dulcy Fanny. Earleen Newport, DO [facility]  Electronically Signed   By: Corrie Mckusick D.O.   On: 11/10/2015  11:59   I have personally reviewed and evaluated these images and lab results as part of my medical decision-making.   EKG Interpretation None      MDM   Final diagnoses:  Sepsis, due to unspecified organism ([place])    Labs: Urinalysis, blood culture, lactic acid, CMP, CBC  Imaging:DG chest  Consults: Palliative care  Therapeutics:Vancomycin and Zosyn, fentanyl  Discharge Meds:   Assessment/Plan:   45-year-old female presents today with sepsis secondary to likely cellulitis. She appears very ill, hypotensive, tachycardic, and slightly altered. Family at bedside throughout evaluation, patient is DO NOT RESUSCITATE. In-depth discussion of family's wishes at bedside. They would like IV antibiotics, fluids, but would not like any vasopressors, antiarrhythmics, intubation, or any further care for further decline. Palliative care was consult and who saw the patient here in the ED, hospital admission to palliative care.         Okey Regal, PA-C 11/10/15 1715  Harvel Quale, MD 11/23/15 [phone]

## 2015-11-10 NOTE — ED Notes (Signed)
Myself and Brooke, RN cleaned patient and placed a clean dry diaper on her; pulled up patient and repositioned on stretcher; reapplied covers on patient and patient is resting; visitors at bedside

## 2015-11-10 NOTE — ED Notes (Signed)
Spoke w/ family re: pt pain management. Pt family says pt is now resting comfortably after 2mg  dilaudid at 1544.

## 2015-11-10 NOTE — ED Notes (Signed)
Per EMS - pt from [facility]. Pt recently admitted to hospital. Pt has cellulitis on bilateral lower extremities, worse on left. Temporal temp 101 F. BP 70/30, tachypneic, lethargic, confused, afib w/ hr 120bpm. Baseline a&o x 4. Pt confused at this time to situation, place, time. Oriented to person. Demand pacemaker. DNR. Hx DVT.

## 2015-11-10 NOTE — ED Notes (Signed)
Attempted report x1. 

## 2015-11-10 NOTE — Progress Notes (Signed)
Pharmacy Antibiotic Note  Kenyell Sirak is a 82 y.o. female admitted on 11/10/2015 with sepsis and cellulitis.  Pharmacy has been consulted for vancomycin and zosyn dosing. Temperature not yet documented, WBC is significantly elevated at 20.6 and SCr is 1.28.   Plan: - Vanc 1gm IV x 1 then 500mg  IV Q24H (recently therapeutic on this regimen) - Zosyn 3.375gm IV Q8H (4 hr inf) - F/u renal fxn, C&S, clinical status and trough at SS  Height: 5\' 4"  (162.6 cm) Weight: 151 lb 14.4 oz (68.9 kg) IBW/kg (Calculated) : 54.7  No data recorded.   Recent Labs Lab 11/10/15 1150  WBC 20.6*  CREATININE 1.28*    Estimated Creatinine Clearance: 27.3 mL/min (by C-G formula based on Cr of 1.28).    Allergies  Allergen Reactions  . Sulfa Antibiotics Itching  . Tramadol Hcl Itching and Rash    Antimicrobials this admission: Vanc 7/11>> Zosyn 7/11>>  Dose adjustments this admission: N/A  Microbiology results: Pending  Thank you for allowing pharmacy to be a part of this patient's care.  Terresa Marlett, Rande Lawman 11/10/2015 12:27 PM

## 2015-11-10 NOTE — ED Notes (Signed)
Patient arrived via [facility] already in gown; patient placed on monitor, continuous pulse oximetry, blood pressure cuff and oxygen [place] (2L)

## 2015-11-10 NOTE — ED Notes (Signed)
Assisted Brooke, RN with readjusting patient on stretcher; cleaning patient from having a bowel movement, placed a diaper on patient and green chuk underneath patient; pulled patient up on stretcher, placed a pillow behind patient's back and placed 2 blankets on patient; patient now resting

## 2015-11-11 DIAGNOSIS — A419 Sepsis, unspecified organism: Principal | ICD-10-CM

## 2015-11-11 DIAGNOSIS — Z789 Other specified health status: Secondary | ICD-10-CM

## 2015-11-11 LAB — BLOOD CULTURE ID PANEL (REFLEXED)

## 2015-11-11 LAB — MRSA PCR SCREENING: MRSA BY PCR: POSITIVE — AB

## 2015-11-11 LAB — URINE CULTURE: Culture: NO GROWTH

## 2015-11-11 MED ORDER — DEXAMETHASONE SODIUM PHOSPHATE 4 MG/ML IJ SOLN
1.0000 mg | Freq: Every day | INTRAMUSCULAR | Status: DC
  Administered 2015-11-11 – 2015-11-12 (×2): 1 mg via INTRAVENOUS
  Filled 2015-11-11 (×2): qty 1

## 2015-11-11 MED ORDER — CHLORHEXIDINE GLUCONATE CLOTH 2 % EX PADS
6.0000 | MEDICATED_PAD | Freq: Every day | CUTANEOUS | Status: DC
  Administered 2015-11-11: 6 via TOPICAL

## 2015-11-11 MED ORDER — LORAZEPAM 2 MG/ML IJ SOLN
0.5000 mg | Freq: Four times a day (QID) | INTRAMUSCULAR | Status: DC
  Administered 2015-11-11 – 2015-11-13 (×7): 0.5 mg via INTRAVENOUS
  Filled 2015-11-11 (×7): qty 1

## 2015-11-11 MED ORDER — MUPIROCIN 2 % EX OINT: 1.0000 "application " | TOPICAL_OINTMENT | Freq: Two times a day (BID) | CUTANEOUS | Status: DC

## 2015-11-11 MED ORDER — SODIUM CHLORIDE 0.9 % IV SOLN
INTRAVENOUS | Status: DC
  Administered 2015-11-14 – 2015-11-15 (×2): 10 mL/h via INTRAVENOUS
  Administered 2015-11-16: 17:00:00 via INTRAVENOUS

## 2015-11-11 MED ORDER — FUROSEMIDE 10 MG/ML IJ SOLN: 40.0000 mg | Freq: Every day | INTRAMUSCULAR | Status: DC

## 2015-11-11 MED ORDER — HYDROMORPHONE HCL 1 MG/ML IJ SOLN
1.0000 mg | INTRAMUSCULAR | Status: DC
  Administered 2015-11-11 – 2015-11-13 (×11): 1 mg via INTRAVENOUS
  Filled 2015-11-11 (×11): qty 1

## 2015-11-11 MED ORDER — HYDROMORPHONE HCL 1 MG/ML IJ SOLN
1.0000 mg | Freq: Four times a day (QID) | INTRAMUSCULAR | Status: DC
  Administered 2015-11-11: 1 mg via INTRAVENOUS
  Filled 2015-11-11: qty 1

## 2015-11-11 MED ORDER — ISOSORBIDE MONONITRATE ER 30 MG PO TB24: 15.0000 mg | ORAL_TABLET | Freq: Every day | ORAL | Status: DC

## 2015-11-11 NOTE — Progress Notes (Signed)
Palliative Medicine RN Note: Rec'd call from son early this am requesting asap visit by PMT. Attempted to speak to patient; she is unable to answer any questions and cannot meet my eyes for more than 5 seconds. Her BLE edema is more pronounced since yesterday. In ED yesterday she could answer my questions, make requests, and assist with turns, as well as follow commands; she can no longer do these things.   Met with son outside of room. He states that he and his wife spoke and that they wish to d/c abx and switch to comfort care only. Family is aware that this will cause pt's death rapidly, in hours to days. Discussed current orders vs switch to pure comfort and that all treatments not specifically for comfort will be stopped. He agrees with this plan. Spoke with Dr Hilma Favors; she is coming to see the patient later today and gave comfort orders to initiate until then. She will evaluate stability for transfer to a hospice facility at that time; currently, pt's status has significantly declined in the last 18 hours, and PMT is concerned she will not survive ambulance transfer.  Son is tearful but accepting. He is leaving this afternoon to go to [place] to bury his father's ashes. Pt's DIL will be at the hospital with the pt. Plan for PMT to return this afternoon.   Marjie Skiff Terris Bodin, RN, BSN, [facility] 11/11/2015 9:28 AM Cell [phone] 8:00-4:00 Monday-Friday Office [phone]

## 2015-11-11 NOTE — Progress Notes (Signed)
PHARMACY - PHYSICIAN COMMUNICATION CRITICAL VALUE ALERT - BLOOD CULTURE IDENTIFICATION (BCID)  Results for orders placed or performed during the hospital encounter of 11/10/15  Blood Culture (routine x 2)     Status: None (Preliminary result)   Collection Time: 11/10/15 11:45 AM  Result Value Ref Range Status   Specimen Description BLOOD LEFT HAND  Final   Special Requests IN PEDIATRIC BOTTLE 3CC  Final   Culture  Setup Time   Final    GRAM POSITIVE COCCOBACILLUS AEROBIC BOTTLE ONLY Organism ID to follow CRITICAL RESULT CALLED TO, READ BACK BY AND VERIFIED WITH: C Genesis Paget PHARMD [id number] 11/11/15 A BROWNING    Culture GRAM POSITIVE COCCOBACILLUS  Final   Report Status PENDING  Incomplete  Blood Culture ID Panel (Reflexed)     Status: None   Collection Time: 11/10/15 11:45 AM  Result Value Ref Range Status   Enterococcus species NOT DETECTED NOT DETECTED Final   Vancomycin resistance NOT DETECTED NOT DETECTED Final   Listeria monocytogenes NOT DETECTED NOT DETECTED Final   Staphylococcus species NOT DETECTED NOT DETECTED Final   Staphylococcus aureus NOT DETECTED NOT DETECTED Final   Methicillin resistance NOT DETECTED NOT DETECTED Final   Streptococcus species NOT DETECTED NOT DETECTED Final   Streptococcus agalactiae NOT DETECTED NOT DETECTED Final   Streptococcus pneumoniae NOT DETECTED NOT DETECTED Final   Streptococcus pyogenes NOT DETECTED NOT DETECTED Final   Acinetobacter baumannii NOT DETECTED NOT DETECTED Final   Enterobacteriaceae species NOT DETECTED NOT DETECTED Final   Enterobacter cloacae complex NOT DETECTED NOT DETECTED Final   Escherichia coli NOT DETECTED NOT DETECTED Final   Klebsiella oxytoca NOT DETECTED NOT DETECTED Final   Klebsiella pneumoniae NOT DETECTED NOT DETECTED Final   Proteus species NOT DETECTED NOT DETECTED Final   Serratia marcescens NOT DETECTED NOT DETECTED Final   Carbapenem resistance NOT DETECTED NOT DETECTED Final   Haemophilus influenzae NOT  DETECTED NOT DETECTED Final   Neisseria meningitidis NOT DETECTED NOT DETECTED Final   Pseudomonas aeruginosa NOT DETECTED NOT DETECTED Final   Candida albicans NOT DETECTED NOT DETECTED Final   Candida glabrata NOT DETECTED NOT DETECTED Final   Candida krusei NOT DETECTED NOT DETECTED Final   Candida parapsilosis NOT DETECTED NOT DETECTED Final   Candida tropicalis NOT DETECTED NOT DETECTED Final  Blood Culture (routine x 2)     Status: None (Preliminary result)   Collection Time: 11/10/15 12:03 PM  Result Value Ref Range Status   Specimen Description BLOOD RIGHT WRIST  Final   Special Requests IN PEDIATRIC BOTTLE 2CC  Final   Culture NO GROWTH 1 DAY  Final   Report Status PENDING  Incomplete  Urine culture     Status: None   Collection Time: 11/10/15 12:17 PM  Result Value Ref Range Status   Specimen Description URINE, RANDOM  Final   Special Requests NONE  Final   Culture NO GROWTH  Final   Report Status 11/11/2015 FINAL  Final  MRSA PCR Screening     Status: Abnormal   Collection Time: 11/10/15 11:36 PM  Result Value Ref Range Status   MRSA by PCR POSITIVE (A) NEGATIVE Final    Comment:        The GeneXpert MRSA Assay (FDA approved for NASAL specimens only), is one component of a comprehensive MRSA colonization surveillance program. It is not intended to diagnose MRSA infection nor to guide or monitor treatment for MRSA infections. RESULT CALLED TO, READ BACK BY AND VERIFIED WITH: M  RASING,RN @0454  11/11/15 [facility]     Name of physician (or Provider) Contacted: not contacted as patient is comfort care  Changes to prescribed antibiotics required: nothing grew on BCID but gram stain revealed gram positive coccobacillus on 1 or 2 blood cultures likely contaminant. Note from Dr Darrick Meigs says patient is moving to comfort measures.  Andrey Cota. Diona Foley, PharmD, [place] Clinical Pharmacist Pager [phone]

## 2015-11-11 NOTE — Progress Notes (Signed)
Palliative Medicine RN Note: Dr Hilma Favors met with son and his wife to discuss goals, prognosis, status. After this meeting, Dr Hilma Favors, in presence of family, gave order to d/c O2 (treat dyspnea with hydromorphone and lorazepam) and adjust comfort meds. Goal is pure comfort care.   Pt having apnea up to 10 seconds. Patient has had marked change in status since this morning, including skin color changes, increased swelling to BUE, and new onset apnea. Respirations are irregularly irregular, and she is using accessory muscles to breathe at times. She is open-mouth breathing.   At family request, rings were removed and given to her son (one gold band with two rows of clear stones, one gold solitaire ring with clear stones, and one plain gold band from left ring finger and one plain gold band from right ring finger). Discussed changes and family goals with pt's RN, who verbalized understanding; she has contact information for PMT in case of changes/concerns. Dr Hilma Favors updated Dr. Darrick Meigs. Plan f/u by PMT tomorrow and PRN.   Marjie Skiff Emmelyn Schmale, RN, BSN, [facility] 11/11/2015 2:02 PM Cell [phone] 8:00-4:00 Monday-Friday Office [phone]

## 2015-11-11 NOTE — Progress Notes (Signed)
[facility]  PROGRESS NOTE  Nicole Roberts [id number] DOB: 1921/09/20 DOA: 11/10/2015 PCP: [name], MD    Brief HPI:  83 y.o. female with medical history significant for but not limited to, hypertension, stage III chronic kidney disease and chronic combined systolic and diastolic heart failure. Patient admitted mid-June with left lower extremity cellulitis refractory to outpatient management . she had acute on chronic back pain as well, CT scan showed a sacral fracture and patient underwent bilateral sacroplasty by IR. Following that patient developed a RLE DVT. She was started on Heparin but developed a large rectus sheath hematoma requiring an IVC filter. Patient was discharged on Augmentin 6/16.   Principal Problem:   Sepsis ([place]) Active Problems:   Hypothyroidism   Rheumatoid arthritis ([place])   Thrush   Cellulitis   AKI (acute kidney injury) ([place])   CKD (chronic kidney disease)   End of life care   Arterial hypotension   Assessment/Plan: 1. Sepsis- secondary to left lower extremity cellulitis, patient was started on vancomycin and Zosyn. Which has now been discontinued after palliative care discussion. Patient made full comfort care. 2. Goals of care discussion- as per palliative care, patient does not full comfort. All the other medications have been discontinued only medications are for comfort. Anticipate hospital death.   DVT prophylaxis: none, patient is on  full comfort measures Code Status: DNR Family Communication: Discussed with son at bedside,  Disposition Plan: Anticipate hospital death   Consultants:  Palliative care  Procedures:  None   Antibiotics:  Vancomycin  Zosyn  Subjective: Patient seen and examined, appears lethargic. Opens eyes to verbal stimuli,  Objective: Filed Vitals:   11/10/15 1545 11/10/15 1734 11/10/15 2133 11/11/15 0600  BP:  81/59 113/53 121/72  Pulse: 42 93 87 118  Temp: 99.5 F (37.5 C) 98.4 F (36.9 C)  98.9 F (37.2 C) 98.2 F (36.8 C)  TempSrc:  Axillary Axillary Oral  Resp: 21 24 14 14   Height:      Weight:      SpO2: 86% 100% 100% 99%    Intake/Output Summary (Last 24 hours) at 11/11/15 1657 Last data filed at 11/11/15 1441  Gross per 24 hour  Intake     90 ml  Output      0 ml  Net     90 ml   Filed Weights   11/10/15 1200  Weight: 68.9 kg (151 lb 14.4 oz)    Examination:  General exam: Appears calm and comfortable  Respiratory system: Clear to auscultation. Respiratory effort normal. Cardiovascular system: S1 & S2 heard, RRR. No JVD, murmurs, rubs, gallops or clicks. No pedal edema. Gastrointestinal system: Abdomen is nondistended, soft and nontender. No organomegaly or masses felt. Normal bowel sounds heard. Central nervous system: Somnolent, opens eyes to verbal stimuli Extremities: Symmetric 5 x 5 power. Skin: Erythema and left lower extremity    Data Reviewed: I have personally reviewed following labs and imaging studies Basic Metabolic Panel:  Recent Labs Lab 11/10/15 1150  NA 140  K 2.9*  CL 102  CO2 28  GLUCOSE 85  BUN 20  CREATININE 1.28*  CALCIUM 8.6*   Liver Function Tests:  Recent Labs Lab 11/10/15 1150  AST 30  ALT 18  ALKPHOS 79  BILITOT 1.1  PROT 5.4*  ALBUMIN 2.6*   No results for input(s): LIPASE, AMYLASE in the last 168 hours. No results for input(s): AMMONIA in the last 168 hours. CBC:  Recent Labs Lab 11/10/15 1150  WBC 20.6*  NEUTROABS 18.0*  HGB 10.8*  HCT 34.5*  MCV 113.1*  PLT 139*   Cardiac Enzymes: No results for input(s): CKTOTAL, CKMB, CKMBINDEX, TROPONINI in the last 168 hours. BNP (last 3 results) No results for input(s): BNP in the last 8760 hours.  ProBNP (last 3 results)  Recent Labs  03/18/15 1643  PROBNP 288.0*    CBG: No results for input(s): GLUCAP in the last 168 hours.  Recent Results (from the past 240 hour(s))  Blood Culture (routine x 2)     Status: None (Preliminary result)    Collection Time: 11/10/15 11:45 AM  Result Value Ref Range Status   Specimen Description BLOOD LEFT HAND  Final   Special Requests IN PEDIATRIC BOTTLE 3CC  Final   Culture  Setup Time   Final    GRAM POSITIVE COCCOBACILLUS AEROBIC BOTTLE ONLY Organism ID to follow    Culture GRAM POSITIVE COCCOBACILLUS  Final   Report Status PENDING  Incomplete  Blood Culture (routine x 2)     Status: None (Preliminary result)   Collection Time: 11/10/15 12:03 PM  Result Value Ref Range Status   Specimen Description BLOOD RIGHT WRIST  Final   Special Requests IN PEDIATRIC BOTTLE 2CC  Final   Culture NO GROWTH 1 DAY  Final   Report Status PENDING  Incomplete  Urine culture     Status: None   Collection Time: 11/10/15 12:17 PM  Result Value Ref Range Status   Specimen Description URINE, RANDOM  Final   Special Requests NONE  Final   Culture NO GROWTH  Final   Report Status 11/11/2015 FINAL  Final  MRSA PCR Screening     Status: Abnormal   Collection Time: 11/10/15 11:36 PM  Result Value Ref Range Status   MRSA by PCR POSITIVE (A) NEGATIVE Final    Comment:        The GeneXpert MRSA Assay (FDA approved for NASAL specimens only), is one component of a comprehensive MRSA colonization surveillance program. It is not intended to diagnose MRSA infection nor to guide or monitor treatment for MRSA infections. RESULT CALLED TO, READ BACK BY AND VERIFIED WITH: Levora Angel @0454  11/11/15 MKELLY      Studies: Dg Chest Portable 1 View  11/10/2015  CLINICAL DATA:  82 year old female with a history of sepsis EXAM: PORTABLE CHEST 1 VIEW COMPARISON:  01/23/2015 FINDINGS: Cardiomediastinal silhouette unchanged in size and contour. Unchanged position of cardiac pacing device on left chest wall. Two leads in place. Overlying EKG leads. Low lung volumes without confluent airspace disease. Coarsened interstitial markings, similar pattern to the comparison. No pneumothorax or pleural effusion. Degenerative  changes of the shoulders. No displaced fracture.  Osteopenia. IMPRESSION: Chronic lung changes without evidence of pneumonia. Cardiac pacing device on the left chest wall is unchanged. Signed, Dulcy Fanny. Earleen Newport, DO [facility] Electronically Signed   By: Corrie Mckusick D.O.   On: 11/10/2015 11:59    Scheduled Meds: . dexamethasone  1 mg Intravenous Daily  .  HYDROmorphone (DILAUDID) injection  1 mg Intravenous Q4H  . LORazepam  0.5 mg Intravenous Q6H   Continuous Infusions: . sodium chloride         Time spent: 25 min    [facility] Pager [phone]. If 7PM-7AM, please contact night-coverage at [email or web address], Office  [phone]  password [email or web address] 11/11/2015, 4:57 PM  LOS: 1 day

## 2015-11-12 MED ORDER — FUROSEMIDE 10 MG/ML IJ SOLN
40.0000 mg | Freq: Two times a day (BID) | INTRAMUSCULAR | Status: DC
  Administered 2015-11-12 – 2015-11-16 (×8): 40 mg via INTRAVENOUS
  Filled 2015-11-12 (×7): qty 4

## 2015-11-12 MED ORDER — HYDROMORPHONE HCL 1 MG/ML IJ SOLN
2.0000 mg | INTRAMUSCULAR | Status: DC | PRN
  Filled 2015-11-12: qty 2

## 2015-11-12 NOTE — Progress Notes (Signed)
Nutrition Brief Note  Chart reviewed. Pt now transitioning to comfort care.  No further nutrition interventions warranted at this time.  Please re-consult as needed.   Francille Wittmann A. Nayib Remer, RD, LDN, CDE Pager: [phone] After hours Pager: [phone]  

## 2015-11-12 NOTE — Progress Notes (Signed)
[facility]  PROGRESS NOTE  Nicole Roberts [id number] DOB: 12/08/1921 DOA: 11/10/2015 PCP: [name], MD    Brief HPI:  82 y.o. female with medical history significant for but not limited to, hypertension, stage III chronic kidney disease and chronic combined systolic and diastolic heart failure. Patient admitted mid-June with left lower extremity cellulitis refractory to outpatient management . she had acute on chronic back pain as well, CT scan showed a sacral fracture and patient underwent bilateral sacroplasty by IR. Following that patient developed a RLE DVT. She was started on Heparin but developed a large rectus sheath hematoma requiring an IVC filter. Patient was discharged on Augmentin 6/16.   Principal Problem:   Sepsis ([place]) Active Problems:   Hypothyroidism   Rheumatoid arthritis ([place])   Thrush   Cellulitis   AKI (acute kidney injury) ([place])   CKD (chronic kidney disease)   End of life care   Arterial hypotension   Assessment/Plan: 1. Sepsis- secondary to left lower extremity cellulitis, patient was started on vancomycin and Zosyn. Which has now been discontinued after palliative care discussion. Patient made full comfort care. 2. Goals of care discussion- as per palliative care, patient does not full comfort. All the other medications have been discontinued only medications are for comfort. Anticipate hospital death.   DVT prophylaxis: none, patient is on  full comfort measures Code Status: DNR Family Communication: Discussed with son at bedside,  Disposition Plan: Anticipate hospital death   Consultants:  Palliative care  Procedures:  None   Antibiotics:  Vancomycin  Zosyn  Subjective: Patient not responding, shallow breathing.  Objective: Filed Vitals:   11/10/15 1734 11/10/15 2133 11/11/15 0600 11/12/15 0541  BP: 81/59 113/53 121/72 131/84  Pulse: 93 87 118 139  Temp: 98.4 F (36.9 C) 98.9 F (37.2 C) 98.2 F (36.8 C) 98.6 F  (37 C)  TempSrc: Axillary Axillary Oral Axillary  Resp: 24 14 14 14   Height:      Weight:      SpO2: 100% 100% 99% 95%    Intake/Output Summary (Last 24 hours) at 11/12/15 1456 Last data filed at 11/12/15 1151  Gross per 24 hour  Intake    200 ml  Output    150 ml  Net     50 ml   Filed Weights   11/10/15 1200  Weight: 68.9 kg (151 lb 14.4 oz)    Examination:  General exam: Appears calm and comfortable  Respiratory system: Clear to auscultation. Respiratory effort normal. Cardiovascular system: S1 & S2 heard, RRR. No JVD, murmurs, rubs, gallops or clicks. No pedal edema. Gastrointestinal system: Abdomen is nondistended, soft and nontender. No organomegaly or masses felt. Normal bowel sounds heard. Central nervous system: Unresponsive Extremities: Symmetric 5 x 5 power. Skin: Erythema and left lower extremity    Data Reviewed: I have personally reviewed following labs and imaging studies Basic Metabolic Panel:  Recent Labs Lab 11/10/15 1150  NA 140  K 2.9*  CL 102  CO2 28  GLUCOSE 85  BUN 20  CREATININE 1.28*  CALCIUM 8.6*   Liver Function Tests:  Recent Labs Lab 11/10/15 1150  AST 30  ALT 18  ALKPHOS 79  BILITOT 1.1  PROT 5.4*  ALBUMIN 2.6*   No results for input(s): LIPASE, AMYLASE in the last 168 hours. No results for input(s): AMMONIA in the last 168 hours. CBC:  Recent Labs Lab 11/10/15 1150  WBC 20.6*  NEUTROABS 18.0*  HGB 10.8*  HCT 34.5*  MCV 113.1*  PLT 139*   Cardiac Enzymes: No results for input(s): CKTOTAL, CKMB, CKMBINDEX, TROPONINI in the last 168 hours. BNP (last 3 results) No results for input(s): BNP in the last 8760 hours.  ProBNP (last 3 results)  Recent Labs  03/18/15 1643  PROBNP 288.0*    CBG: No results for input(s): GLUCAP in the last 168 hours.  Recent Results (from the past 240 hour(s))  Blood Culture (routine x 2)     Status: None (Preliminary result)   Collection Time: 11/10/15 11:45 AM  Result  Value Ref Range Status   Specimen Description BLOOD LEFT HAND  Final   Special Requests IN PEDIATRIC BOTTLE 3CC  Final   Culture  Setup Time   Final    GRAM POSITIVE COCCOBACILLUS AEROBIC BOTTLE ONLY CRITICAL RESULT CALLED TO, READ BACK BY AND VERIFIED WITH: C BALL PHARMD 1827 11/11/15 A BROWNING    Culture   Final    GRAM POSITIVE COCCOBACILLUS CULTURE REINCUBATED FOR BETTER GROWTH    Report Status PENDING  Incomplete  Blood Culture ID Panel (Reflexed)     Status: None   Collection Time: 11/10/15 11:45 AM  Result Value Ref Range Status   Enterococcus species NOT DETECTED NOT DETECTED Final   Vancomycin resistance NOT DETECTED NOT DETECTED Final   Listeria monocytogenes NOT DETECTED NOT DETECTED Final   Staphylococcus species NOT DETECTED NOT DETECTED Final   Staphylococcus aureus NOT DETECTED NOT DETECTED Final   Methicillin resistance NOT DETECTED NOT DETECTED Final   Streptococcus species NOT DETECTED NOT DETECTED Final   Streptococcus agalactiae NOT DETECTED NOT DETECTED Final   Streptococcus pneumoniae NOT DETECTED NOT DETECTED Final   Streptococcus pyogenes NOT DETECTED NOT DETECTED Final   Acinetobacter baumannii NOT DETECTED NOT DETECTED Final   Enterobacteriaceae species NOT DETECTED NOT DETECTED Final   Enterobacter cloacae complex NOT DETECTED NOT DETECTED Final   Escherichia coli NOT DETECTED NOT DETECTED Final   Klebsiella oxytoca NOT DETECTED NOT DETECTED Final   Klebsiella pneumoniae NOT DETECTED NOT DETECTED Final   Proteus species NOT DETECTED NOT DETECTED Final   Serratia marcescens NOT DETECTED NOT DETECTED Final   Carbapenem resistance NOT DETECTED NOT DETECTED Final   Haemophilus influenzae NOT DETECTED NOT DETECTED Final   Neisseria meningitidis NOT DETECTED NOT DETECTED Final   Pseudomonas aeruginosa NOT DETECTED NOT DETECTED Final   Candida albicans NOT DETECTED NOT DETECTED Final   Candida glabrata NOT DETECTED NOT DETECTED Final   Candida krusei NOT  DETECTED NOT DETECTED Final   Candida parapsilosis NOT DETECTED NOT DETECTED Final   Candida tropicalis NOT DETECTED NOT DETECTED Final  Blood Culture (routine x 2)     Status: None (Preliminary result)   Collection Time: 11/10/15 12:03 PM  Result Value Ref Range Status   Specimen Description BLOOD RIGHT WRIST  Final   Special Requests IN PEDIATRIC BOTTLE 2CC  Final   Culture NO GROWTH 1 DAY  Final   Report Status PENDING  Incomplete  Urine culture     Status: None   Collection Time: 11/10/15 12:17 PM  Result Value Ref Range Status   Specimen Description URINE, RANDOM  Final   Special Requests NONE  Final   Culture NO GROWTH  Final   Report Status 11/11/2015 FINAL  Final  MRSA PCR Screening     Status: Abnormal   Collection Time: 11/10/15 11:36 PM  Result Value Ref Range Status   MRSA by PCR POSITIVE (A) NEGATIVE Final    Comment:  The GeneXpert MRSA Assay (FDA approved for NASAL specimens only), is one component of a comprehensive MRSA colonization surveillance program. It is not intended to diagnose MRSA infection nor to guide or monitor treatment for MRSA infections. RESULT CALLED TO, READ BACK BY AND VERIFIED WITH: Levora Angel @0454  11/11/15 MKELLY      Studies: No results found.  Scheduled Meds: . dexamethasone  1 mg Intravenous Daily  .  HYDROmorphone (DILAUDID) injection  1 mg Intravenous Q4H  . LORazepam  0.5 mg Intravenous Q6H   Continuous Infusions: . sodium chloride 10 mL/hr at 11/11/15 0700     Time spent: 25 min    [place] Pager [phone]. If 7PM-7AM, please contact night-coverage at [email or web address], Office  [phone]  password [email or web address] 11/12/2015, 2:56 PM  LOS: 2 days

## 2015-11-12 NOTE — Progress Notes (Signed)
PMT RN Note: saw pt and visited DIL. Goal remains strict comfort care. Spoke with RN and NT; pt woke up moaning when she was being turned/repositioned. Discussed with Dr Hilma Favors; pt with increasing BLE edema as well. Orders obtained to keep pt comfortable during moves and to provide IV lasix to help with swelling (pt has taken med in past despite renal issues). Plan for PMT to see her tomorrow to ensure comfort and monitor for decline.  Marjie Skiff Rosie Torrez, RN, BSN, [facility] 11/12/2015 3:07 PM Cell [phone] 8:00-4:00 Monday-Friday Office [phone]

## 2015-11-12 NOTE — Progress Notes (Signed)
Palliative Medicine RN Note: Patient's daughter in law at bedside; her son has flown to [facility] for his father's funeral. Patient with open-mouth, shallow respirations with periods of apnea. DIL reports that she requested one dose of prn hydromorphone overnight. Pt opened her eyes yesterday when I removed her rings and answered one yes/no question. DIL reports she has not made any effort at communication since then, and while her face has a few twitches, she does not respond to gentle stimuli.   All decision-making family is at the funeral in [place] and will not return until late 62. I introduced yesterday the topic of inpatient hospice should the pt exceed our expected prognosis. At this time, there is no one to make the decision or sign consents, so PMT will continue evaluation at the visit this afternoon and tomorrow. Please call with any questions/concerns.  Marjie Skiff Tomi Grandpre, RN, BSN, [facility] 11/12/2015 10:02 AM Cell [phone] 8:00-4:00 Monday-Friday Office [phone]

## 2015-11-12 NOTE — Progress Notes (Signed)
Ch met with Daughter-in-law at bedside as a follow up visit. Pt. Was calm and restful. The family is at peace and appreciative of the care and understanding of [place] of presence, listening, and prayer. I will follow up as needed.  Graciela Husbands

## 2015-11-13 MED ORDER — SODIUM CHLORIDE 0.9 % IV SOLN
1.0000 mg/h | INTRAVENOUS | Status: DC
  Administered 2015-11-13 – 2015-11-15 (×2): 0.5 mg/h via INTRAVENOUS
  Filled 2015-11-13 (×2): qty 2.5

## 2015-11-13 MED ORDER — LORAZEPAM 2 MG/ML IJ SOLN
0.5000 mg | INTRAMUSCULAR | Status: DC
  Administered 2015-11-13 – 2015-11-16 (×13): 0.5 mg via INTRAVENOUS
  Filled 2015-11-13 (×13): qty 1

## 2015-11-13 NOTE — Progress Notes (Signed)
PMT RN Note: saw pt with Dr Hilma Favors. RR approx 4-5/minute. Pt is hot to touch. Eyes slightly open at times with unfixed gaze. Patient required another change to medications per Dr Hilma Favors. Prognosis is very grave, and family will be staying with her for the duration. Pt's other children are unable to come see pt in this condition.  Discussed procedures to expect after death re: cleaning body, call to crematorium, etc; family verbalized understanding. They would like to use [employer] out of [place] (phone number [phone] or [phone]. Updated RN; Dr Hilma Favors will discuss with Dr Darrick Meigs. Per Dr Hilma Favors, death is expected in minutes to hours.  Marjie Skiff Marvetta Vohs, RN, BSN, [facility] 11/13/2015 2:10 PM Cell [phone] 8:00-4:00 Monday-Friday Office [phone]

## 2015-11-13 NOTE — Progress Notes (Signed)
[facility]  PROGRESS NOTE  Nicole Roberts [id number] DOB: [birth year] DOA: 11/10/2015 PCP: [name], MD    Brief HPI:  82 y.o. female with medical history significant for but not limited to, hypertension, stage III chronic kidney disease and chronic combined systolic and diastolic heart failure. Patient admitted mid-June with left lower extremity cellulitis refractory to outpatient management . she had acute on chronic back pain as well, CT scan showed a sacral fracture and patient underwent bilateral sacroplasty by IR. Following that patient developed a RLE DVT. She was started on Heparin but developed a large rectus sheath hematoma requiring an IVC filter. Patient was discharged on Augmentin 6/16.   Principal Problem:   Sepsis ([place]) Active Problems:   Hypothyroidism   Rheumatoid arthritis ([place])   Thrush   Cellulitis   AKI (acute kidney injury) ([place])   CKD (chronic kidney disease)   End of life care   Arterial hypotension   Assessment/Plan: 1. Sepsis- secondary to left lower extremity cellulitis, patient was started on vancomycin and Zosyn. Which has now been discontinued after palliative care discussion. Patient made full comfort care. 2. Goals of care discussion- as per palliative care, patient does not full comfort. All the other medications have been discontinued only medications are for comfort. Anticipate hospital death.   DVT prophylaxis: none, patient is on  full comfort measures Code Status: DNR Family Communication: Discussed with son at bedside,  Disposition Plan: Anticipate hospital death   Consultants:  Palliative care  Procedures:  None   Antibiotics:  Vancomycin  Zosyn  Subjective: Patient not responding, shallow breathing.  Objective: Filed Vitals:   11/10/15 2133 11/11/15 0600 11/12/15 0541 11/13/15 0632  BP: 113/53 121/72 131/84 113/72  Pulse: 87 118 139 136  Temp: 98.9 F (37.2 C) 98.2 F (36.8 C) 98.6 F (37 C) 99.8 F  (37.7 C)  TempSrc: Axillary Oral Axillary Axillary  Resp: 14 14 14    Height:      Weight:      SpO2: 100% 99% 95% 94%    Intake/Output Summary (Last 24 hours) at 11/13/15 1332 Last data filed at 11/13/15 1240  Gross per 24 hour  Intake     60 ml  Output   1000 ml  Net   -940 ml   Filed Weights   11/10/15 1200  Weight: 68.9 kg (151 lb 14.4 oz)    Examination:  General exam: Appears calm and comfortable  Respiratory system: Clear to auscultation. Respiratory effort normal. Cardiovascular system: S1 & S2 heard, RRR. No JVD, murmurs, rubs, gallops or clicks. No pedal edema. Gastrointestinal system: Abdomen is nondistended, soft and nontender. No organomegaly or masses felt. Normal bowel sounds heard. Central nervous system: Unresponsive Extremities: Symmetric 5 x 5 power. Skin: Erythema in  left lower extremity    Data Reviewed: I have personally reviewed following labs and imaging studies Basic Metabolic Panel:  Recent Labs Lab 11/10/15 1150  NA 140  K 2.9*  CL 102  CO2 28  GLUCOSE 85  BUN 20  CREATININE 1.28*  CALCIUM 8.6*   Liver Function Tests:  Recent Labs Lab 11/10/15 1150  AST 30  ALT 18  ALKPHOS 79  BILITOT 1.1  PROT 5.4*  ALBUMIN 2.6*   No results for input(s): LIPASE, AMYLASE in the last 168 hours. No results for input(s): AMMONIA in the last 168 hours. CBC:  Recent Labs Lab 11/10/15 1150  WBC 20.6*  NEUTROABS 18.0*  HGB 10.8*  HCT 34.5*  MCV 113.1*  PLT 139*   Cardiac Enzymes: No results for input(s): CKTOTAL, CKMB, CKMBINDEX, TROPONINI in the last 168 hours. BNP (last 3 results) No results for input(s): BNP in the last 8760 hours.  ProBNP (last 3 results)  Recent Labs  03/18/15 1643  PROBNP 288.0*    CBG: No results for input(s): GLUCAP in the last 168 hours.  Recent Results (from the past 240 hour(s))  Blood Culture (routine x 2)     Status: None (Preliminary result)   Collection Time: 11/10/15 11:45 AM  Result  Value Ref Range Status   Specimen Description BLOOD LEFT HAND  Final   Special Requests IN PEDIATRIC BOTTLE 3CC  Final   Culture  Setup Time   Final    GRAM POSITIVE COCCOBACILLUS AEROBIC BOTTLE ONLY CRITICAL RESULT CALLED TO, READ BACK BY AND VERIFIED WITH: C BALL PHARMD 1827 11/11/15 A BROWNING    Culture   Final    GRAM POSITIVE COCCOBACILLUS CULTURE REINCUBATED FOR BETTER GROWTH    Report Status PENDING  Incomplete  Blood Culture ID Panel (Reflexed)     Status: None   Collection Time: 11/10/15 11:45 AM  Result Value Ref Range Status   Enterococcus species NOT DETECTED NOT DETECTED Final   Vancomycin resistance NOT DETECTED NOT DETECTED Final   Listeria monocytogenes NOT DETECTED NOT DETECTED Final   Staphylococcus species NOT DETECTED NOT DETECTED Final   Staphylococcus aureus NOT DETECTED NOT DETECTED Final   Methicillin resistance NOT DETECTED NOT DETECTED Final   Streptococcus species NOT DETECTED NOT DETECTED Final   Streptococcus agalactiae NOT DETECTED NOT DETECTED Final   Streptococcus pneumoniae NOT DETECTED NOT DETECTED Final   Streptococcus pyogenes NOT DETECTED NOT DETECTED Final   Acinetobacter baumannii NOT DETECTED NOT DETECTED Final   Enterobacteriaceae species NOT DETECTED NOT DETECTED Final   Enterobacter cloacae complex NOT DETECTED NOT DETECTED Final   Escherichia coli NOT DETECTED NOT DETECTED Final   Klebsiella oxytoca NOT DETECTED NOT DETECTED Final   Klebsiella pneumoniae NOT DETECTED NOT DETECTED Final   Proteus species NOT DETECTED NOT DETECTED Final   Serratia marcescens NOT DETECTED NOT DETECTED Final   Carbapenem resistance NOT DETECTED NOT DETECTED Final   Haemophilus influenzae NOT DETECTED NOT DETECTED Final   Neisseria meningitidis NOT DETECTED NOT DETECTED Final   Pseudomonas aeruginosa NOT DETECTED NOT DETECTED Final   Candida albicans NOT DETECTED NOT DETECTED Final   Candida glabrata NOT DETECTED NOT DETECTED Final   Candida krusei NOT  DETECTED NOT DETECTED Final   Candida parapsilosis NOT DETECTED NOT DETECTED Final   Candida tropicalis NOT DETECTED NOT DETECTED Final  Blood Culture (routine x 2)     Status: None (Preliminary result)   Collection Time: 11/10/15 12:03 PM  Result Value Ref Range Status   Specimen Description BLOOD RIGHT WRIST  Final   Special Requests IN PEDIATRIC BOTTLE 2CC  Final   Culture NO GROWTH 2 DAYS  Final   Report Status PENDING  Incomplete  Urine culture     Status: None   Collection Time: 11/10/15 12:17 PM  Result Value Ref Range Status   Specimen Description URINE, RANDOM  Final   Special Requests NONE  Final   Culture NO GROWTH  Final   Report Status 11/11/2015 FINAL  Final  MRSA PCR Screening     Status: Abnormal   Collection Time: 11/10/15 11:36 PM  Result Value Ref Range Status   MRSA by PCR POSITIVE (A) NEGATIVE Final    Comment:  The GeneXpert MRSA Assay (FDA approved for NASAL specimens only), is one component of a comprehensive MRSA colonization surveillance program. It is not intended to diagnose MRSA infection nor to guide or monitor treatment for MRSA infections. RESULT CALLED TO, READ BACK BY AND VERIFIED WITH: Levora Angel @0454  11/11/15 MKELLY      Studies: No results found.  Scheduled Meds: . furosemide  40 mg Intravenous BID  .  HYDROmorphone (DILAUDID) injection  1 mg Intravenous Q4H  . LORazepam  0.5 mg Intravenous Q6H   Continuous Infusions: . sodium chloride 10 mL/hr at 11/11/15 0700     Time spent: 25 min    [facility] Pager [phone]. If 7PM-7AM, please contact night-coverage at [email or web address], Office  [phone]  password [email or web address] 11/13/2015, 1:32 PM  LOS: 3 days

## 2015-11-14 LAB — CULTURE, BLOOD (ROUTINE X 2)

## 2015-11-14 NOTE — Progress Notes (Signed)
Patient is comfortable RN at bedside Discussed with son Art present in the room Continue comfort measures  Loistine Chance MD [facility] health palliative medicine team [phone]

## 2015-11-14 NOTE — Progress Notes (Signed)
[facility]  PROGRESS NOTE  Nicole Roberts [id number] DOB: [birth year] DOA: 11/10/2015 PCP: [name], MD    Brief HPI:  82 y.o. female with medical history significant for but not limited to, hypertension, stage III chronic kidney disease and chronic combined systolic and diastolic heart failure. Patient admitted mid-June with left lower extremity cellulitis refractory to outpatient management . she had acute on chronic back pain as well, CT scan showed a sacral fracture and patient underwent bilateral sacroplasty by IR. Following that patient developed a RLE DVT. She was started on Heparin but developed a large rectus sheath hematoma requiring an IVC filter. Patient was discharged on Augmentin 6/16.   Principal Problem:   Sepsis ([place]) Active Problems:   Hypothyroidism   Rheumatoid arthritis ([place])   Thrush   Cellulitis   AKI (acute kidney injury) ([place])   CKD (chronic kidney disease)   End of life care   Arterial hypotension   Assessment/Plan: 1. Sepsis- secondary to left lower extremity cellulitis, patient was started on vancomycin and Zosyn. Which has now been discontinued after palliative care discussion. Patient made full comfort care. 2. Goals of care discussion- as per palliative care, patient does not full comfort. All the other medications have been discontinued only medications are for comfort. Anticipate hospital death.   DVT prophylaxis: none, patient is on  full comfort measures Code Status: DNR Family Communication: Discussed with son at bedside,  Disposition Plan: Anticipate hospital death   Consultants:  Palliative care  Procedures:  None   Antibiotics:  Vancomycin  Zosyn  Subjective: Patient not responding, shallow breathing.  Objective: Filed Vitals:   11/11/15 0600 11/12/15 0541 11/13/15 0632 11/14/15 0626  BP: 121/72 131/84 113/72 130/70  Pulse: 118 139 136 116  Temp: 98.2 F (36.8 C) 98.6 F (37 C) 99.8 F (37.7 C) 99.1  F (37.3 C)  TempSrc: Oral Axillary Axillary Axillary  Resp: 14 14    Height:      Weight:      SpO2: 99% 95% 94% 94%    Intake/Output Summary (Last 24 hours) at 11/14/15 1346 Last data filed at 11/14/15 0442  Gross per 24 hour  Intake      0 ml  Output   1000 ml  Net  -1000 ml   Filed Weights   11/10/15 1200  Weight: 68.9 kg (151 lb 14.4 oz)    Examination:  General exam: Appears calm and comfortable  Respiratory system: Clear to auscultation. Respiratory effort normal. Cardiovascular system: S1 & S2 heard, RRR. No JVD, murmurs, rubs, gallops or clicks. No pedal edema. Gastrointestinal system: Abdomen is nondistended, soft and nontender. No organomegaly or masses felt. Normal bowel sounds heard. Central nervous system: Unresponsive Extremities: Symmetric 5 x 5 power. Skin: Erythema in  left lower extremity    Data Reviewed: I have personally reviewed following labs and imaging studies Basic Metabolic Panel:  Recent Labs Lab 11/10/15 1150  NA 140  K 2.9*  CL 102  CO2 28  GLUCOSE 85  BUN 20  CREATININE 1.28*  CALCIUM 8.6*   Liver Function Tests:  Recent Labs Lab 11/10/15 1150  AST 30  ALT 18  ALKPHOS 79  BILITOT 1.1  PROT 5.4*  ALBUMIN 2.6*   No results for input(s): LIPASE, AMYLASE in the last 168 hours. No results for input(s): AMMONIA in the last 168 hours. CBC:  Recent Labs Lab 11/10/15 1150  WBC 20.6*  NEUTROABS 18.0*  HGB 10.8*  HCT 34.5*  MCV 113.1*  PLT 139*   Cardiac Enzymes: No results for input(s): CKTOTAL, CKMB, CKMBINDEX, TROPONINI in the last 168 hours. BNP (last 3 results) No results for input(s): BNP in the last 8760 hours.  ProBNP (last 3 results)  Recent Labs  03/18/15 1643  PROBNP 288.0*    CBG: No results for input(s): GLUCAP in the last 168 hours.  Recent Results (from the past 240 hour(s))  Blood Culture (routine x 2)     Status: Abnormal   Collection Time: 11/10/15 11:45 AM  Result Value Ref Range  Status   Specimen Description BLOOD LEFT HAND  Final   Special Requests IN PEDIATRIC BOTTLE 3CC  Final   Culture  Setup Time   Final    GRAM POSITIVE COCCOBACILLUS AEROBIC BOTTLE ONLY CRITICAL RESULT CALLED TO, READ BACK BY AND VERIFIED WITH: C BALL PHARMD 1827 11/11/15 A BROWNING    Culture (A)  Final    DIPHTHEROIDS(CORYNEBACTERIUM SPECIES) Standardized susceptibility testing for this organism is not available.    Report Status 11/14/2015 FINAL  Final  Blood Culture ID Panel (Reflexed)     Status: None   Collection Time: 11/10/15 11:45 AM  Result Value Ref Range Status   Enterococcus species NOT DETECTED NOT DETECTED Final   Vancomycin resistance NOT DETECTED NOT DETECTED Final   Listeria monocytogenes NOT DETECTED NOT DETECTED Final   Staphylococcus species NOT DETECTED NOT DETECTED Final   Staphylococcus aureus NOT DETECTED NOT DETECTED Final   Methicillin resistance NOT DETECTED NOT DETECTED Final   Streptococcus species NOT DETECTED NOT DETECTED Final   Streptococcus agalactiae NOT DETECTED NOT DETECTED Final   Streptococcus pneumoniae NOT DETECTED NOT DETECTED Final   Streptococcus pyogenes NOT DETECTED NOT DETECTED Final   Acinetobacter baumannii NOT DETECTED NOT DETECTED Final   Enterobacteriaceae species NOT DETECTED NOT DETECTED Final   Enterobacter cloacae complex NOT DETECTED NOT DETECTED Final   Escherichia coli NOT DETECTED NOT DETECTED Final   Klebsiella oxytoca NOT DETECTED NOT DETECTED Final   Klebsiella pneumoniae NOT DETECTED NOT DETECTED Final   Proteus species NOT DETECTED NOT DETECTED Final   Serratia marcescens NOT DETECTED NOT DETECTED Final   Carbapenem resistance NOT DETECTED NOT DETECTED Final   Haemophilus influenzae NOT DETECTED NOT DETECTED Final   Neisseria meningitidis NOT DETECTED NOT DETECTED Final   Pseudomonas aeruginosa NOT DETECTED NOT DETECTED Final   Candida albicans NOT DETECTED NOT DETECTED Final   Candida glabrata NOT DETECTED NOT  DETECTED Final   Candida krusei NOT DETECTED NOT DETECTED Final   Candida parapsilosis NOT DETECTED NOT DETECTED Final   Candida tropicalis NOT DETECTED NOT DETECTED Final  Blood Culture (routine x 2)     Status: None (Preliminary result)   Collection Time: 11/10/15 12:03 PM  Result Value Ref Range Status   Specimen Description BLOOD RIGHT WRIST  Final   Special Requests IN PEDIATRIC BOTTLE 2CC  Final   Culture NO GROWTH 3 DAYS  Final   Report Status PENDING  Incomplete  Urine culture     Status: None   Collection Time: 11/10/15 12:17 PM  Result Value Ref Range Status   Specimen Description URINE, RANDOM  Final   Special Requests NONE  Final   Culture NO GROWTH  Final   Report Status 11/11/2015 FINAL  Final  MRSA PCR Screening     Status: Abnormal   Collection Time: 11/10/15 11:36 PM  Result Value Ref Range Status   MRSA by PCR POSITIVE (A) NEGATIVE Final    Comment:  The GeneXpert MRSA Assay (FDA approved for NASAL specimens only), is one component of a comprehensive MRSA colonization surveillance program. It is not intended to diagnose MRSA infection nor to guide or monitor treatment for MRSA infections. RESULT CALLED TO, READ BACK BY AND VERIFIED WITH: Levora Angel @0454  11/11/15 MKELLY      Studies: No results found.  Scheduled Meds: . furosemide  40 mg Intravenous BID  . LORazepam  0.5 mg Intravenous Q4H   Continuous Infusions: . sodium chloride 10 mL/hr at 11/11/15 0700  . HYDROmorphone 0.5 mg/hr (11/13/15 1433)     Time spent: 25 min    [facility] Pager [phone]. If 7PM-7AM, please contact night-coverage at [email or web address], Office  [phone]  password [email or web address] 11/14/2015, 1:46 PM  LOS: 4 days

## 2015-11-15 DIAGNOSIS — N179 Acute kidney failure, unspecified: Secondary | ICD-10-CM

## 2015-11-15 LAB — CULTURE, BLOOD (ROUTINE X 2): Culture: NO GROWTH

## 2015-11-15 NOTE — Progress Notes (Signed)
[facility]  PROGRESS NOTE  Nicole Roberts [id number] DOB: 18-Nov-1921 DOA: 11/10/2015 PCP: [name], MD    Brief HPI:  82 y.o. female with medical history significant for but not limited to, hypertension, stage III chronic kidney disease and chronic combined systolic and diastolic heart failure. Patient admitted mid-June with left lower extremity cellulitis refractory to outpatient management . she had acute on chronic back pain as well, CT scan showed a sacral fracture and patient underwent bilateral sacroplasty by IR. Following that patient developed a RLE DVT. She was started on Heparin but developed a large rectus sheath hematoma requiring an IVC filter. Patient was discharged on Augmentin 6/16.   Principal Problem:   Sepsis ([place]) Active Problems:   Hypothyroidism   Rheumatoid arthritis ([place])   Thrush   Cellulitis   AKI (acute kidney injury) ([place])   CKD (chronic kidney disease)   End of life care   Arterial hypotension   Assessment/Plan: 1. Sepsis- secondary to left lower extremity cellulitis, patient was started on vancomycin and Zosyn. Which has now been discontinued after palliative care discussion. Patient made full comfort care. 2. Goals of care discussion- as per palliative care, patient does not full comfort. All the other medications have been discontinued only medications are for comfort. Anticipate hospital death.   DVT prophylaxis: none, patient is on  full comfort measures Code Status: DNR Family Communication: Discussed with son at bedside,  Disposition Plan: Anticipate hospital death   Consultants:  Palliative care  Procedures:  None   Antibiotics:  Vancomycin  Zosyn  Subjective: Patient not responding, shallow breathing.  Objective: Filed Vitals:   11/11/15 0600 11/12/15 0541 11/13/15 0632 11/14/15 0626  BP: 121/72 131/84 113/72 130/70  Pulse: 118 139 136 116  Temp: 98.2 F (36.8 C) 98.6 F (37 C) 99.8 F (37.7 C) 99.1  F (37.3 C)  TempSrc: Oral Axillary Axillary Axillary  Resp: 14 14    Height:      Weight:      SpO2: 99% 95% 94% 94%    Intake/Output Summary (Last 24 hours) at 11/15/15 1249 Last data filed at 11/15/15 0500  Gross per 24 hour  Intake     81 ml  Output   2200 ml  Net  -2119 ml   Filed Weights   11/10/15 1200  Weight: 68.9 kg (151 lb 14.4 oz)    Examination:  General exam: Appears calm and comfortable  Respiratory system: Clear to auscultation. Respiratory effort normal. Cardiovascular system: S1 & S2 heard, RRR. No JVD, murmurs, rubs, gallops or clicks. No pedal edema. Gastrointestinal system: Abdomen is nondistended, soft and nontender. No organomegaly or masses felt. Normal bowel sounds heard. Central nervous system: Unresponsive Extremities: Symmetric 5 x 5 power. Skin: Erythema in  left lower extremity    Data Reviewed: I have personally reviewed following labs and imaging studies Basic Metabolic Panel:  Recent Labs Lab 11/10/15 1150  NA 140  K 2.9*  CL 102  CO2 28  GLUCOSE 85  BUN 20  CREATININE 1.28*  CALCIUM 8.6*   Liver Function Tests:  Recent Labs Lab 11/10/15 1150  AST 30  ALT 18  ALKPHOS 79  BILITOT 1.1  PROT 5.4*  ALBUMIN 2.6*   No results for input(s): LIPASE, AMYLASE in the last 168 hours. No results for input(s): AMMONIA in the last 168 hours. CBC:  Recent Labs Lab 11/10/15 1150  WBC 20.6*  NEUTROABS 18.0*  HGB 10.8*  HCT 34.5*  MCV 113.1*  PLT  139*   Cardiac Enzymes: No results for input(s): CKTOTAL, CKMB, CKMBINDEX, TROPONINI in the last 168 hours. BNP (last 3 results) No results for input(s): BNP in the last 8760 hours.  ProBNP (last 3 results)  Recent Labs  03/18/15 1643  PROBNP 288.0*    CBG: No results for input(s): GLUCAP in the last 168 hours.  Recent Results (from the past 240 hour(s))  Blood Culture (routine x 2)     Status: Abnormal   Collection Time: 11/10/15 11:45 AM  Result Value Ref Range  Status   Specimen Description BLOOD LEFT HAND  Final   Special Requests IN PEDIATRIC BOTTLE 3CC  Final   Culture  Setup Time   Final    GRAM POSITIVE COCCOBACILLUS AEROBIC BOTTLE ONLY CRITICAL RESULT CALLED TO, READ BACK BY AND VERIFIED WITH: C BALL PHARMD 1827 11/11/15 A BROWNING    Culture (A)  Final    DIPHTHEROIDS(CORYNEBACTERIUM SPECIES) Standardized susceptibility testing for this organism is not available.    Report Status 11/14/2015 FINAL  Final  Blood Culture ID Panel (Reflexed)     Status: None   Collection Time: 11/10/15 11:45 AM  Result Value Ref Range Status   Enterococcus species NOT DETECTED NOT DETECTED Final   Vancomycin resistance NOT DETECTED NOT DETECTED Final   Listeria monocytogenes NOT DETECTED NOT DETECTED Final   Staphylococcus species NOT DETECTED NOT DETECTED Final   Staphylococcus aureus NOT DETECTED NOT DETECTED Final   Methicillin resistance NOT DETECTED NOT DETECTED Final   Streptococcus species NOT DETECTED NOT DETECTED Final   Streptococcus agalactiae NOT DETECTED NOT DETECTED Final   Streptococcus pneumoniae NOT DETECTED NOT DETECTED Final   Streptococcus pyogenes NOT DETECTED NOT DETECTED Final   Acinetobacter baumannii NOT DETECTED NOT DETECTED Final   Enterobacteriaceae species NOT DETECTED NOT DETECTED Final   Enterobacter cloacae complex NOT DETECTED NOT DETECTED Final   Escherichia coli NOT DETECTED NOT DETECTED Final   Klebsiella oxytoca NOT DETECTED NOT DETECTED Final   Klebsiella pneumoniae NOT DETECTED NOT DETECTED Final   Proteus species NOT DETECTED NOT DETECTED Final   Serratia marcescens NOT DETECTED NOT DETECTED Final   Carbapenem resistance NOT DETECTED NOT DETECTED Final   Haemophilus influenzae NOT DETECTED NOT DETECTED Final   Neisseria meningitidis NOT DETECTED NOT DETECTED Final   Pseudomonas aeruginosa NOT DETECTED NOT DETECTED Final   Candida albicans NOT DETECTED NOT DETECTED Final   Candida glabrata NOT DETECTED NOT  DETECTED Final   Candida krusei NOT DETECTED NOT DETECTED Final   Candida parapsilosis NOT DETECTED NOT DETECTED Final   Candida tropicalis NOT DETECTED NOT DETECTED Final  Blood Culture (routine x 2)     Status: None (Preliminary result)   Collection Time: 11/10/15 12:03 PM  Result Value Ref Range Status   Specimen Description BLOOD RIGHT WRIST  Final   Special Requests IN PEDIATRIC BOTTLE 2CC  Final   Culture NO GROWTH 4 DAYS  Final   Report Status PENDING  Incomplete  Urine culture     Status: None   Collection Time: 11/10/15 12:17 PM  Result Value Ref Range Status   Specimen Description URINE, RANDOM  Final   Special Requests NONE  Final   Culture NO GROWTH  Final   Report Status 11/11/2015 FINAL  Final  MRSA PCR Screening     Status: Abnormal   Collection Time: 11/10/15 11:36 PM  Result Value Ref Range Status   MRSA by PCR POSITIVE (A) NEGATIVE Final    Comment:  The GeneXpert MRSA Assay (FDA approved for NASAL specimens only), is one component of a comprehensive MRSA colonization surveillance program. It is not intended to diagnose MRSA infection nor to guide or monitor treatment for MRSA infections. RESULT CALLED TO, READ BACK BY AND VERIFIED WITH: Levora Angel @0454  11/11/15 MKELLY      Studies: No results found.  Scheduled Meds: . furosemide  40 mg Intravenous BID  . LORazepam  0.5 mg Intravenous Q4H   Continuous Infusions: . sodium chloride 10 mL/hr (11/14/15 1200)  . HYDROmorphone 0.5 mg/hr (11/14/15 1400)     Time spent: 25 min    [facility] Pager [phone]. If 7PM-7AM, please contact night-coverage at [email or web address], Office  [phone]  password [email or web address] 11/15/2015, 12:49 PM  LOS: 5 days

## 2015-11-16 ENCOUNTER — Inpatient Hospital Stay (HOSPITAL_COMMUNITY)
Admission: AD | Admit: 2015-11-16 | Discharge: 2015-12-01 | DRG: 872 | Disposition: E | Source: Ambulatory Visit | Attending: Internal Medicine | Admitting: Internal Medicine

## 2015-11-16 DIAGNOSIS — A419 Sepsis, unspecified organism: Secondary | ICD-10-CM | POA: Diagnosis not present

## 2015-11-16 DIAGNOSIS — Z515 Encounter for palliative care: Secondary | ICD-10-CM | POA: Diagnosis present

## 2015-11-16 DIAGNOSIS — I4891 Unspecified atrial fibrillation: Secondary | ICD-10-CM | POA: Diagnosis present

## 2015-11-16 DIAGNOSIS — M8448XA Pathological fracture, other site, initial encounter for fracture: Secondary | ICD-10-CM | POA: Diagnosis present

## 2015-11-16 DIAGNOSIS — I509 Heart failure, unspecified: Secondary | ICD-10-CM | POA: Diagnosis present

## 2015-11-16 DIAGNOSIS — I824Z1 Acute embolism and thrombosis of unspecified deep veins of right distal lower extremity: Secondary | ICD-10-CM | POA: Diagnosis present

## 2015-11-16 MED ORDER — SODIUM CHLORIDE 0.9 % IV SOLN
1.0000 mg/h | INTRAVENOUS | Status: DC
  Filled 2015-11-16 (×2): qty 10

## 2015-11-16 MED ORDER — GLYCOPYRROLATE 0.2 MG/ML IJ SOLN: 0.4000 mg | Freq: Four times a day (QID) | INTRAMUSCULAR | Status: DC

## 2015-11-16 MED ORDER — LORAZEPAM 2 MG/ML IJ SOLN
1.0000 mg | INTRAMUSCULAR | Status: DC
  Administered 2015-11-16 – 2015-11-17 (×3): 1 mg via INTRAVENOUS
  Filled 2015-11-16 (×3): qty 1

## 2015-11-16 MED ORDER — ACETAMINOPHEN 325 MG PO TABS: 650.0000 mg | ORAL_TABLET | Freq: Four times a day (QID) | ORAL | Status: DC | PRN

## 2015-11-16 MED ORDER — ONDANSETRON HCL 4 MG/2ML IJ SOLN: 4.0000 mg | Freq: Four times a day (QID) | INTRAMUSCULAR | Status: DC | PRN

## 2015-11-16 MED ORDER — BIOTENE DRY MOUTH MT LIQD: 15.0000 mL | OROMUCOSAL | Status: DC | PRN

## 2015-11-16 MED ORDER — GLYCOPYRROLATE 1 MG PO TABS
1.0000 mg | ORAL_TABLET | ORAL | Status: DC | PRN
  Filled 2015-11-16: qty 1

## 2015-11-16 MED ORDER — ONDANSETRON 4 MG PO TBDP: 4.0000 mg | ORAL_TABLET | Freq: Four times a day (QID) | ORAL | Status: DC | PRN

## 2015-11-16 MED ORDER — GLYCOPYRROLATE 0.2 MG/ML IJ SOLN: 0.2000 mg | INTRAMUSCULAR | Status: DC | PRN

## 2015-11-16 MED ORDER — GLYCOPYRROLATE 0.2 MG/ML IJ SOLN
0.2000 mg | INTRAMUSCULAR | Status: DC | PRN
Start: 2015-11-16 — End: 2015-11-17
  Administered 2015-11-16 – 2015-11-17 (×3): 0.2 mg via INTRAVENOUS
  Filled 2015-11-16 (×3): qty 1

## 2015-11-16 MED ORDER — HYDROMORPHONE BOLUS VIA INFUSION
2.0000 mg | INTRAVENOUS | Status: DC | PRN
  Administered 2015-11-16 – 2015-11-17 (×2): 2 mg via INTRAVENOUS
  Filled 2015-11-16 (×3): qty 2

## 2015-11-16 MED ORDER — ACETAMINOPHEN 650 MG RE SUPP: 650.0000 mg | Freq: Four times a day (QID) | RECTAL | Status: DC | PRN

## 2015-11-16 MED ORDER — POLYVINYL ALCOHOL 1.4 % OP SOLN: 1.0000 [drp] | Freq: Four times a day (QID) | OPHTHALMIC | Status: DC | PRN

## 2015-11-16 NOTE — Progress Notes (Signed)
PMT RN Note: Saw pt with Dr Hilma Favors. Right foot with +4 pitting edema; very pale; foot is turning in. Left foot dry and shriveled with DTI on great toe. Marker lines on left leg noting previous cellulitis area; this appears to have improved, but her left calf is hot to the touch and remains red. She is unresponsive with markedly increased secretions since my visit Friday. She is breathing hard and using stomach muscles. Hands are pale and grey-tinged with cool fingertips. Discussed with Dr Hilma Favors, who adjusted Dilaudid drip and added scheduled Robinul.  Son verbalized psychosocial distress. He states that he understands that he made the right decision for their family (withdrawing aggressive care) but that he is having trouble accepting it "in my heart," voicing concern that if they had provided food and fluid the pt may have improved. Discussed decision-making and family goals; he states clearly that this is what the pt would want and that this is the treatment the family wants for her.   Discussed process for GIP with family; specifically that hospice will likely be contacting them and that there will be consents to sign. Discussed indication for this change (pt requiring aggressive symptom management but respiratory system too unstable to transfer). Family verbalized understanding.   Marjie Skiff Collette Pescador, RN, BSN, [facility] 11/09/2015 2:08 PM Cell [phone] 8:00-4:00 Monday-Friday Office [phone]

## 2015-11-16 NOTE — Progress Notes (Signed)
PMT RN: spoke with son Art, reviewed chart, updated Dr Hilma Favors. She is recommending change in GIP in place, as resp status is very fragile. Orders placed to SW, and at Dr Delanna Ahmadi request, called [facility] rep. Son is in agreement; his wife Pamala Hurry has joint HCPOA and will be at [facility] to meet with [facility] rep if he has had to go home.  Marjie Skiff Stefanee Mckell, RN, BSN, [facility] 11/23/2015 1:29 PM Cell [phone] 8:00-4:00 Monday-Friday Office [phone]

## 2015-11-16 NOTE — Progress Notes (Signed)
[facility]  PROGRESS NOTE  Nicole Roberts [id number] DOB: 11-21-1921 DOA: 11/10/2015 PCP: [name], MD    Brief HPI:  82 y.o. female with medical history significant for but not limited to, hypertension, stage III chronic kidney disease and chronic combined systolic and diastolic heart failure. Patient admitted mid-June with left lower extremity cellulitis refractory to outpatient management . she had acute on chronic back pain as well, CT scan showed a sacral fracture and patient underwent bilateral sacroplasty by IR. Following that patient developed a RLE DVT. She was started on Heparin but developed a large rectus sheath hematoma requiring an IVC filter. Patient was discharged on Augmentin 6/16.   Principal Problem:   Sepsis ([place]) Active Problems:   Hypothyroidism   Rheumatoid arthritis ([place])   Thrush   Cellulitis   AKI (acute kidney injury) ([place])   CKD (chronic kidney disease)   End of life care   Arterial hypotension   Assessment/Plan: 1. Sepsis- secondary to left lower extremity cellulitis, patient was started on vancomycin and Zosyn. Which has now been discontinued after palliative care discussion. Patient made full comfort care. Continue Dilaudid infusion. 2. Goals of care discussion- as per palliative care, patient does not full comfort. All the other medications have been discontinued only medications are for comfort. Anticipate hospital death.   DVT prophylaxis: none, patient is on  full comfort measures Code Status: DNR Family Communication: Discussed with son at bedside,  Disposition Plan: Anticipate hospital death   Consultants:  Palliative care  Procedures:  None   Antibiotics:  Vancomycin  Zosyn  Subjective: Patient not responding, shallow breathing.Son at bedside  Objective: Filed Vitals:   11/13/15 [id number] 11/14/15 0626 11/15/15 1256 11/22/2015 0416  BP: 113/72 130/70 143/78 147/84  Pulse: 136 116 129 135  Temp: 99.8 F (37.7  C) 99.1 F (37.3 C) 99 F (37.2 C) 97.6 F (36.4 C)  TempSrc: Axillary Axillary Axillary Oral  Resp:      Height:      Weight:      SpO2: 94% 94% 96% 96%    Intake/Output Summary (Last 24 hours) at 11/21/2015 1551 Last data filed at 11/19/2015 1432  Gross per 24 hour  Intake 112.53 ml  Output    550 ml  Net -437.47 ml   Filed Weights   11/10/15 1200  Weight: 68.9 kg (151 lb 14.4 oz)    Examination:  General exam: Appears calm and comfortable  Respiratory system: Clear to auscultation. Respiratory effort normal. Cardiovascular system: S1 & S2 heard, RRR. No JVD, murmurs, rubs, gallops or clicks. No pedal edema. Gastrointestinal system: Abdomen is nondistended, soft and nontender. No organomegaly or masses felt. Normal bowel sounds heard. Central nervous system: Unresponsive Extremities: Symmetric 5 x 5 power. Skin: Erythema in  left lower extremity    Data Reviewed: I have personally reviewed following labs and imaging studies Basic Metabolic Panel:  Recent Labs Lab 11/10/15 1150  NA 140  K 2.9*  CL 102  CO2 28  GLUCOSE 85  BUN 20  CREATININE 1.28*  CALCIUM 8.6*   Liver Function Tests:  Recent Labs Lab 11/10/15 1150  AST 30  ALT 18  ALKPHOS 79  BILITOT 1.1  PROT 5.4*  ALBUMIN 2.6*   No results for input(s): LIPASE, AMYLASE in the last 168 hours. No results for input(s): AMMONIA in the last 168 hours. CBC:  Recent Labs Lab 11/10/15 1150  WBC 20.6*  NEUTROABS 18.0*  HGB 10.8*  HCT 34.5*  MCV 113.1*  PLT 139*   Cardiac Enzymes: No results for input(s): CKTOTAL, CKMB, CKMBINDEX, TROPONINI in the last 168 hours. BNP (last 3 results) No results for input(s): BNP in the last 8760 hours.  ProBNP (last 3 results)  Recent Labs  03/18/15 1643  PROBNP 288.0*    CBG: No results for input(s): GLUCAP in the last 168 hours.  Recent Results (from the past 240 hour(s))  Blood Culture (routine x 2)     Status: Abnormal   Collection Time: 11/10/15  11:45 AM  Result Value Ref Range Status   Specimen Description BLOOD LEFT HAND  Final   Special Requests IN PEDIATRIC BOTTLE 3CC  Final   Culture  Setup Time   Final    GRAM POSITIVE COCCOBACILLUS AEROBIC BOTTLE ONLY CRITICAL RESULT CALLED TO, READ BACK BY AND VERIFIED WITH: C BALL PHARMD 1827 11/11/15 A BROWNING    Culture (A)  Final    DIPHTHEROIDS(CORYNEBACTERIUM SPECIES) Standardized susceptibility testing for this organism is not available.    Report Status 11/14/2015 FINAL  Final  Blood Culture ID Panel (Reflexed)     Status: None   Collection Time: 11/10/15 11:45 AM  Result Value Ref Range Status   Enterococcus species NOT DETECTED NOT DETECTED Final   Vancomycin resistance NOT DETECTED NOT DETECTED Final   Listeria monocytogenes NOT DETECTED NOT DETECTED Final   Staphylococcus species NOT DETECTED NOT DETECTED Final   Staphylococcus aureus NOT DETECTED NOT DETECTED Final   Methicillin resistance NOT DETECTED NOT DETECTED Final   Streptococcus species NOT DETECTED NOT DETECTED Final   Streptococcus agalactiae NOT DETECTED NOT DETECTED Final   Streptococcus pneumoniae NOT DETECTED NOT DETECTED Final   Streptococcus pyogenes NOT DETECTED NOT DETECTED Final   Acinetobacter baumannii NOT DETECTED NOT DETECTED Final   Enterobacteriaceae species NOT DETECTED NOT DETECTED Final   Enterobacter cloacae complex NOT DETECTED NOT DETECTED Final   Escherichia coli NOT DETECTED NOT DETECTED Final   Klebsiella oxytoca NOT DETECTED NOT DETECTED Final   Klebsiella pneumoniae NOT DETECTED NOT DETECTED Final   Proteus species NOT DETECTED NOT DETECTED Final   Serratia marcescens NOT DETECTED NOT DETECTED Final   Carbapenem resistance NOT DETECTED NOT DETECTED Final   Haemophilus influenzae NOT DETECTED NOT DETECTED Final   Neisseria meningitidis NOT DETECTED NOT DETECTED Final   Pseudomonas aeruginosa NOT DETECTED NOT DETECTED Final   Candida albicans NOT DETECTED NOT DETECTED Final    Candida glabrata NOT DETECTED NOT DETECTED Final   Candida krusei NOT DETECTED NOT DETECTED Final   Candida parapsilosis NOT DETECTED NOT DETECTED Final   Candida tropicalis NOT DETECTED NOT DETECTED Final  Blood Culture (routine x 2)     Status: None   Collection Time: 11/10/15 12:03 PM  Result Value Ref Range Status   Specimen Description BLOOD RIGHT WRIST  Final   Special Requests IN PEDIATRIC BOTTLE 2CC  Final   Culture NO GROWTH 5 DAYS  Final   Report Status 11/15/2015 FINAL  Final  Urine culture     Status: None   Collection Time: 11/10/15 12:17 PM  Result Value Ref Range Status   Specimen Description URINE, RANDOM  Final   Special Requests NONE  Final   Culture NO GROWTH  Final   Report Status 11/11/2015 FINAL  Final  MRSA PCR Screening     Status: Abnormal   Collection Time: 11/10/15 11:36 PM  Result Value Ref Range Status   MRSA by PCR POSITIVE (A) NEGATIVE Final    Comment:  The GeneXpert MRSA Assay (FDA approved for NASAL specimens only), is one component of a comprehensive MRSA colonization surveillance program. It is not intended to diagnose MRSA infection nor to guide or monitor treatment for MRSA infections. RESULT CALLED TO, READ BACK BY AND VERIFIED WITH: Levora Angel @0454  11/11/15 MKELLY      Studies: No results found.  Scheduled Meds: . furosemide  40 mg Intravenous BID  . glycopyrrolate  0.4 mg Intravenous QID   Continuous Infusions: . sodium chloride 10 mL/hr (11/15/15 1316)  . HYDROmorphone 1 mg/hr (10/31/2015 1432)     Time spent: 25 min    [facility] Pager [phone]. If 7PM-7AM, please contact night-coverage at [email or web address], Office  [phone]  password [email or web address] 11/10/2015, 3:51 PM  LOS: 6 days

## 2015-11-16 NOTE — Progress Notes (Signed)
While rounding, I did a follow up visit with the son of the patient. The son was questioning his initial decision of DNR as the patient is resilient though non responsive. I provided the ministry of listening and prayer. I will follow up with this family as needed.  Darene Lamer Saman Giddens    11/30/2015 0900  Clinical Encounter Type  Visited With Patient and family together  Visit Type Follow-up;Spiritual support;Patient actively dying  Referral From Family  Spiritual Encounters  Spiritual Needs Prayer;Emotional;Grief support  Stress Factors  Patient Stress Factors Major life changes

## 2015-11-16 NOTE — H&P (Signed)
Mrs. Nicole Roberts is being admitted under [facility] with a diagnosis of Sepsis. She requires aggressive, intensive treatment to control dyspnea and pain symptoms at EOL. She cannot be transported due to her fragile condition and care can not feasibly be provided in any other setting.  Her primary diagnosis is Sepsis and is complicated by a sacral insufficiency fracture, DVT in her right lower extremity, heart failure and rapid A-Fib. Her prognosis is hours-days. She is unresponsive. Family is at bedside.  Attending responsibilities will be provided by the Palliative Care team (Dr. Hilma Favors) and the hospice provider is [facility].  Please call [phone] for questions related to Nicole Roberts care or for additional orders, uncontrolled symptoms and at time of death.  For additional H&P details please see original H&P documentation and Palliative Consultation Note.  Lane Hacker, DO Palliative Medicine [phone]

## 2015-11-16 NOTE — Progress Notes (Signed)
PMT RN ADDENDUM: Dr Hilma Favors will admit as attending if switched to GIP.  Marjie Skiff Ziair Penson, RN, BSN, [facility] 11/26/2015 2:16 PM Cell [phone] 8:00-4:00 Monday-Friday Office [phone]

## 2015-11-16 NOTE — Consult Note (Signed)
Consultation Note Date: 11/27/2015   Patient Name: Nicole Roberts  DOB: Sep 17, 1921  MRN: [id number]  Age / Sex: 82 y.o., female  PCP: [name], MD Referring Physician: Oswald Hillock, MD  Reason for Consultation: Establishing goals of care, Non pain symptom management, Pain control, Psychosocial/spiritual support and Terminal Care  HPI/Patient Profile: 82 y.o. female with medical history significant for but not limited to, hypertension, stage III chronic kidney disease and chronic combined systolic and diastolic heart failure. She was a resident at [facility] prior to admission. Patient admitted mid-June with left lower extremity cellulitis refractory to outpatient management . she had acute on chronic back pain as well, CT scan showed a sacral fracture and patient underwent bilateral sacroplasty by IR. Following that patient developed a RLE DVT. She was started on Heparin but developed a large rectus sheath hematoma requiring an IVC filter. Patient was discharged on Augmentin 6/16 and then readmitted on 7/11 with hypotension, altered mental status and lower extremity cellulitis and presumed sepsis with adrenal insufficiency. Palliative care consult called on 7/12 for transition to comfort care.  Clinical Assessment and Goals of Care: Ms. Weidemann appears to be rapidly approaching EOL. Family has elected to shift towards full comfort care given her current decline and pre-hospital functional status and QOL.   Her son is going to [employer] to bury his father's ashes tomorrow- this is causing him distress because he wants to be with his mother given how fragile she is right now.  Our team provided reassurance and support.   Recommendations for comfort measures and medications are below.  HCPOA    SUMMARY OF RECOMMENDATIONS    Code Status/Advance Care Planning:  DNR    Symptom  Management:   Initiate scheduled hydromorphone and PRN for pain and dyspnea  Scheduled Ativan for agitation   EOL order set  Palliative Prophylaxis:   Aspiration, Bowel Regimen, Delirium Protocol, Eye Care, Frequent Pain Assessment, Oral Care, Palliative Wound Care and Turn Reposition  Additional Recommendations (Limitations, Scope, Preferences):  Full Comfort Care  Psycho-social/Spiritual:   Desire for further Chaplaincy support:yes  Additional Recommendations: Education on Hospice and Grief/Bereavement Support  Prognosis:   Hours - Days  Discharge Planning: Anticipated Hospital Death      Primary Diagnoses: Present on Admission:  . Sepsis ([place]) . Cellulitis . Hypothyroidism . Thrush . Rheumatoid arthritis ([place])  I have reviewed the medical record, interviewed the patient and family, and examined the patient. The following aspects are pertinent.  Past Medical History  Diagnosis Date  . HEARING LOSS   . MITRAL VALVE INSUFF&AORTIC VALVE INSUFF     moderate MR  . PULMONARY HYPERTENSION   . Atrial fibrillation ([place])     permanent  . SICK SINUS SYNDROME     a. Tachybrady syndrome - Guidant PPM 10/2005.  . Edema 03/19/2009    due to venous insufficiency, chronic lymphedema  . URINARY INCONTINENCE   . Diastolic dysfunction   . HYPERTENSION   . HYPERLIPIDEMIA   . GERD   .  HYPOTHYROIDISM   . Hyperparathyroidism     2 lobes removed  . Venous insufficiency     chronic BLE edema  . Spinal stenosis   . Anemia     a. Macrocytic - normal B12, folate 08/2011. b. 3/6 heme positive stools 10/2011 - per PCP note, elected against colonscopy.  [name] TIA (transient ischemic attack) 05/2011; 10/2015  . Anxiety   . Presence of permanent cardiac pacemaker   . CHF (congestive heart failure) ([place])   . Myocardial infarction ([place]) 1977  . DVT (deep venous thrombosis) ([place]) 10/2015    RLE  . History of blood transfusion 1990s; 10/2015    "related to hip OR; while hospitalized"  .  History of hiatal hernia     "small, dx'd 10/2015" (11/10/2015)  . Rheumatoid arthritis(714.0) dx clarified 2011    On prednisone  . Arthritis     "all over" (11/10/2015)  . Chronic lower back pain   . Stenosis, spinal, lumbar   . Depression   . Chronic kidney disease (CKD), stage III (moderate)   . BREAST CANCER 12/2006    ductal ca s/p L lumpectomy   Social History   Social History  . Marital Status: Widowed    Spouse Name: N/A  . Number of Children: 6  . Years of Education: N/A   Occupational History  . Retired Agricultural engineer    Social History Main Topics  . Smoking status: Never Smoker   . Smokeless tobacco: Never Used  . Alcohol Use: No  . Drug Use: No  . Sexual Activity: No   Other Topics Concern  . None   Social History Narrative   Lives at [facility] since 03/2009- married, lives with spouse. Moved here from [facility] to be near son   Family History  Problem Relation Age of Onset  . Ovarian cancer Mother   . Coronary artery disease Sister   . Coronary artery disease Brother   . Hypertension Mother     grandparent  . Lung cancer Father    Scheduled Meds: . furosemide  40 mg Intravenous BID  . glycopyrrolate  0.4 mg Intravenous QID   Continuous Infusions: . sodium chloride 10 mL/hr (11/15/15 1316)  . HYDROmorphone 1 mg/hr (11/05/2015 1432)   PRN Meds:.[DISCONTINUED] acetaminophen **OR** acetaminophen, antiseptic oral rinse, [DISCONTINUED] glycopyrrolate **OR** glycopyrrolate **OR** glycopyrrolate, HYDROmorphone (DILAUDID) injection, HYDROmorphone (DILAUDID) injection, [DISCONTINUED] ondansetron **OR** ondansetron (ZOFRAN) IV, polyvinyl alcohol Medications Prior to Admission:  Prior to Admission medications   Medication Sig Start Date End Date Taking? Authorizing Provider  acetaminophen (TYLENOL) 500 MG tablet Take 2 tablets (1,000 mg total) by mouth every 8 (eight) hours. X 3 days 10/15/15  Yes Ripudeep Krystal Eaton, MD  apixaban (ELIQUIS) 2.5 MG TABS tablet  Take 1 tablet (2.5 mg total) by mouth 2 (two) times daily. tentative starting date 10/21/15 but will need MD to reassess abdominal hematoma prior to starting eliquis 10/21/15  Yes Ripudeep K Rai, MD  calcium citrate-vitamin D (CITRACAL+D) 315-200 MG-UNIT tablet Take 1 tablet by mouth 2 (two) times daily. Patient taking differently: Take 1 tablet by mouth 2 (two) times daily. Gummies 08/24/15  Yes [name], MD  cholecalciferol (VITAMIN D) 1000 UNITS tablet Take 1,000 Units by mouth daily.   Yes Historical Provider, MD  citalopram (CELEXA) 20 MG tablet Take 1 tablet (20 mg total) by mouth daily. 10/15/15  Yes Ripudeep Krystal Eaton, MD  conjugated estrogens (PREMARIN) vaginal cream Place 1 Applicatorful vaginally 3 (three) times a week.  Yes Historical Provider, MD  fentaNYL (DURAGESIC - DOSED MCG/HR) 75 MCG/HR Place 1 patch (75 mcg total) onto the skin every 3 (three) days. 10/15/15  Yes Ripudeep Krystal Eaton, MD  folic acid (FOLVITE) A999333 MCG tablet Take 400 mcg by mouth every morning.   Yes Historical Provider, MD  furosemide (LASIX) 20 MG tablet Take 2 tablets (40 mg total) by mouth daily. Until directed to decrease back to 20 mg daily 03/18/15  Yes [name], MD  gabapentin (NEURONTIN) 400 MG capsule Take 2 capsules (800 mg total) by mouth at bedtime. 12/02/14  Yes Rowe Clack, MD  isosorbide mononitrate (IMDUR) 30 MG 24 hr tablet Take 15 mg by mouth daily.   Yes Historical Provider, MD  levothyroxine (SYNTHROID, LEVOTHROID) 50 MCG tablet Take 1 tablet (50 mcg total) by mouth daily. Patient taking differently: Take 50 mcg by mouth daily before breakfast.  06/09/14  Yes Rowe Clack, MD  loperamide (IMODIUM) 2 MG capsule Take 1 capsule (2 mg total) by mouth every 6 (six) hours as needed for diarrhea or loose stools. 10/15/15  Yes Ripudeep Krystal Eaton, MD  magnesium oxide (MAG-OX) 400 MG tablet Take 400 mg by mouth daily.   Yes Historical Provider, MD  metoprolol succinate (TOPROL-XL) 50 MG 24 hr tablet Take  1 tablet (50 mg total) by mouth 2 (two) times daily. Take with or immediately following a meal. 06/02/14  Yes Rowe Clack, MD  Multiple Vitamin (DAILY-VITE) TABS TAKE ONE TABLET BY MOUTH ONCE DAILY. 07/17/15  Yes [name], MD  nystatin (MYCOSTATIN) 100000 UNIT/ML suspension Take 5 mLs (500,000 Units total) by mouth 4 (four) times daily. Use for two days after symptoms have resolved and then stop 09/21/15  Yes [name], MD  oxyCODONE (OXY IR/ROXICODONE) 5 MG immediate release tablet Take 5 mg by mouth every 6 (six) hours as needed for severe pain.   Yes Historical Provider, MD  pantoprazole (PROTONIX) 40 MG tablet Take 1 tablet (40 mg total) by mouth 2 (two) times daily. 08/24/15  Yes [name], MD  potassium chloride SA (K-DUR,KLOR-CON) 20 MEQ tablet TAKE ONE TABLET BY MOUTH ONCE DAILY. 06/26/15  Yes [name], MD  predniSONE (DELTASONE) 5 MG tablet Take 5 mg by mouth daily with breakfast.   Yes Historical Provider, MD  saccharomyces boulardii (FLORASTOR) 250 MG capsule Take 250 mg by mouth daily.   Yes Historical Provider, MD  sucralfate (CARAFATE) 1 GM/10ML suspension Take 1 g by mouth 2 (two) times daily.   Yes Historical Provider, MD  vitamin B-12 (CYANOCOBALAMIN) 500 MCG tablet TAKE ONE TABLET BY MOUTH ONCE DAILY. 11/20/13  Yes Rowe Clack, MD  nitroGLYCERIN (NITROSTAT) 0.4 MG SL tablet Place 0.4 mg under the tongue every 5 (five) minutes x 3 doses as needed for chest pain. 03/20/12   Roger A Arguello, PA-C   Allergies  Allergen Reactions  . Sulfa Antibiotics Itching  . Tape     Only tolerates paper tape. All others tear the skin  . Tramadol Hcl Itching and Rash   Review of Systems  Physical Exam  Vital Signs: BP 147/84 mmHg  Pulse 135  Temp(Src) 97.6 F (36.4 C) (Oral)  Resp 14  Ht 5\' 4"  (1.626 m)  Wt 68.9 kg (151 lb 14.4 oz)  BMI 26.06 kg/m2  SpO2 96% Pain Assessment: PAINAD   Pain Score: Asleep   SpO2: SpO2: 96 % O2 Device:SpO2: 96 % O2 Flow  Rate: .O2 Flow Rate (  L/min): 2 L/min  IO: Intake/output summary:  Intake/Output Summary (Last 24 hours) at 11/21/2015 1457 Last data filed at 11/28/2015 1432  Gross per 24 hour  Intake 112.53 ml  Output    550 ml  Net -437.47 ml    LBM: Last BM Date: 11/11/15 Baseline Weight: Weight: 68.9 kg (151 lb 14.4 oz) Most recent weight: Weight: 68.9 kg (151 lb 14.4 oz)     Palliative Assessment/Data:   Flowsheet Rows        Most Recent Value   Intake Tab    Referral Department  -- [ED]   Unit at Time of Referral  ER   Palliative Care Primary Diagnosis  Sepsis/Infectious Disease   Date Notified  11/10/15   Palliative Care Type  New Palliative care   Reason for referral  Clarify Goals of Care   Date of Admission  11/10/15   Date first seen by Palliative Care  11/10/15   # of days Palliative referral response time  0 Day(s)   # of days IP prior to Palliative referral  0   Clinical Assessment    Palliative Performance Scale Score  10%   Psychosocial & Spiritual Assessment    Palliative Care Outcomes    Patient/Family meeting held?  Yes   Who was at the meeting?  Son "Art" , DIL   Palliative Care Outcomes  Counseled regarding hospice, Clarified goals of care      Time In: 1PM Time Out: 1:50PM  Time Total: 50 min Greater than 50%  of this time was spent counseling and coordinating care related to the above assessment and plan.  Signed by: Lane Hacker, DO   Please contact Palliative Medicine Team phone at [phone] for questions and concerns.  For individual provider: See Shea Evans

## 2015-11-16 NOTE — Care Management Note (Signed)
Case Management Note  Patient Details  Name: Nicole Roberts MRN: [id number] Date of Birth: [birth year]  Subjective/Objective:                    Action/Plan: GIP   Expected Discharge Date:                  Expected Discharge Plan:     In-House Referral:     Discharge planning Services  CM Consult  Post Acute Care Choice:    Choice offered to:  Adult Children  DME Arranged:    DME Agency:     HH Arranged:    HH Agency:     Status of Service:  In process, will continue to follow  If discussed at Long Length of Stay Meetings, dates discussed:    Additional Comments:  Marilu Favre, RN 11/19/2015, 2:32 PM

## 2015-11-16 NOTE — Progress Notes (Signed)
Daily Progress Note   Patient Name: Nicole Roberts       Date: 11/18/2015 DOB: December 27, 1921  Age: 82 y.o. MRN#: [id number] Attending Physician: Oswald Hillock, MD Primary Care Physician: [name], MD Admit Date: 11/10/2015  Reason for Consultation/Follow-up: Establishing goals of care, Non pain symptom management, Pain control and Terminal Care  Subjective: Nicole Roberts continues to decline. She has developed a rattle and increased oral secretions in her posterior pharynx. She has tension and a grimace on her face on my exam today. She is using abdominal muscles to breathe. Worsening peripheral edema. Her infusion was increased over the weekend to 1mg /hr. She continues to need bolus dosing of PRN for dyspnea and pain.   Length of Stay: 6  Current Medications: Scheduled Meds:  . furosemide  40 mg Intravenous BID  . glycopyrrolate  0.4 mg Intravenous QID    Continuous Infusions: . sodium chloride 10 mL/hr (11/15/15 1316)  . HYDROmorphone 1 mg/hr (11/02/2015 1432)    PRN Meds: [DISCONTINUED] acetaminophen **OR** acetaminophen, antiseptic oral rinse, [DISCONTINUED] glycopyrrolate **OR** glycopyrrolate **OR** glycopyrrolate, HYDROmorphone (DILAUDID) injection, HYDROmorphone (DILAUDID) injection, [DISCONTINUED] ondansetron **OR** ondansetron (ZOFRAN) IV, polyvinyl alcohol  Physical Exam          Vital Signs: BP 147/84 mmHg  Pulse 135  Temp(Src) 97.6 F (36.4 C) (Oral)  Resp 14  Ht 5\' 4"  (1.626 m)  Wt 68.9 kg (151 lb 14.4 oz)  BMI 26.06 kg/m2  SpO2 96% SpO2: SpO2: 96 % O2 Device: O2 Device: Not Delivered O2 Flow Rate: O2 Flow Rate (L/min): 2 L/min  Intake/output summary:  Intake/Output Summary (Last 24 hours) at 11/04/2015 1441 Last data filed at 11/15/15 1919  Gross per 24 hour    Intake     55 ml  Output    550 ml  Net   -495 ml   LBM: Last BM Date: 11/11/15 Baseline Weight: Weight: 68.9 kg (151 lb 14.4 oz) Most recent weight: Weight: 68.9 kg (151 lb 14.4 oz)       Palliative Assessment/Data:    Flowsheet Rows        Most Recent Value   Intake Tab    Referral Department  -- [ED]   Unit at Time of Referral  ER   Palliative Care Primary Diagnosis  Sepsis/Infectious Disease   Date Notified  11/10/15   Palliative Care Type  New Palliative care   Reason for referral  Clarify Goals of Care   Date of Admission  11/10/15   Date first seen by Palliative Care  11/10/15   # of days Palliative referral response time  0 Day(s)   # of days IP prior to Palliative referral  0   Clinical Assessment    Palliative Performance Scale Score  10%   Psychosocial & Spiritual Assessment    Palliative Care Outcomes    Patient/Family meeting held?  Yes   Who was at the meeting?  Son "Art" , DIL   Palliative Care Outcomes  Counseled regarding hospice, Clarified goals of care      Patient Active Problem List   Diagnosis Date Noted  . Sepsis ([place]) 11/10/2015  . AKI (acute kidney injury) ([place]) 11/10/2015  . CKD (chronic kidney disease) 11/10/2015  . End of life care   . Arterial hypotension   . Cellulitis of left lower extremity   . Cellulitis 10/06/2015  . Thrush 09/21/2015  . Oral ulcer 09/21/2015  . Chronic congestive heart failure with left ventricular diastolic dysfunction ([place]) 05/06/2015  . Symptomatic bradycardia 05/06/2015  . Acute gallstone pancreatitis 02/05/2015  . Gastritis and gastroduodenitis 01/30/2015  . Possible Acute cholangitis 01/30/2015  . Choledocholithiasis 01/29/2015  . Edema leg 01/27/2015  . Diarrhea   . Duodenitis 01/26/2015  . Sepsis due to Escherichia coli (E. coli) ([place]) 01/25/2015  . Thrombocytopenia ([place]) 01/25/2015  . Protein-calorie malnutrition, moderate ([place]) 01/25/2015  . Wheelchair bound 01/25/2015  . Acute encephalopathy  01/25/2015  . Recurrent cold sores 01/25/2015  . Acute pancreatitis 01/23/2015  . Abdominal pain, epigastric 01/23/2015  . Hypokalemia 01/23/2015  . Leukocytosis 01/23/2015  . Neuropathy ([place]) 01/23/2015  . Chronic combined systolic and diastolic CHF (congestive heart failure) ([place]) 01/23/2015  . Depression   . Stage III chronic kidney disease 04/06/2012  . Atrial fibrillation ([place]) 02/05/2012  . Rheumatoid arthritis ([place]) 01/26/2010  . Hypothyroidism 05/21/2009  . Dyslipidemia 05/21/2009  . Essential hypertension 05/21/2009  . GERD 05/21/2009    Palliative Care Assessment & Plan   Patient Profile: 83 y.o. female with medical history significant for but not limited to, hypertension, stage III chronic kidney disease and chronic combined systolic and diastolic heart failure. Patient admitted mid-June with left lower extremity cellulitis refractory to outpatient management . she had acute on chronic back pain as well, CT scan showed a sacral fracture and patient underwent bilateral sacroplasty by IR. Following that patient developed a RLE DVT. She was started on Heparin but developed a large rectus sheath hematoma requiring an IVC filter. Patient was discharged on Augmentin 6/16 and then readmitted on 7/11 with hypotension, altered mental status and lower extremity cellulitis and presumed sepsis with adrenal insufficiency.   Assessment: Nicole Roberts is actively dying from complications of sepsis, comorbid disease and advanced age with decline in functional status. She is requiring continuous infusion of hydromorphone and we are adjusting it for her comfort along with PRN doses. Her breathing pattern has changed today- she is very warm to touch, her extremities are mottling and pale along with edema.   Family have been at bedside ATC- difficult time with her prolonged dying phase. She has been without fluids or nutrition for 6 days, mostly unresponsive, family struggling to rationalize the  prolonged dying but are coping very well with reassurance and support.   Recommendations/Plan:  Continue full comfort care  Increase Dilaudid infusion to 1.5mg /hr  Start scheduled and PRN robinul  Goals of Care and Additional Recommendations:  Limitations on Scope of Treatment: Full Comfort Care  Code Status:    Code Status Orders        Start     Ordered   11/11/15 0913  Do not attempt resuscitation (DNR)   Continuous    Question Answer Comment  In the event of cardiac or respiratory ARREST Do not call a "code blue"   In the event of cardiac or respiratory ARREST Do not perform Intubation, CPR, defibrillation or ACLS   In the event of cardiac or respiratory ARREST Use medication by any route, position, wound care, and other measures to relive pain and suffering. May use oxygen, suction and manual treatment of airway obstruction as needed for comfort.      11/11/15 0914    Code Status History    Date Active Date Inactive Code Status Order ID Comments User Context   11/10/2015  2:34 PM 11/11/2015  9:14 AM DNR [id number]  Aura Camps, RN ED   10/07/2015  1:07 AM 10/15/2015  6:46 PM DNR [id number]  Phillips Grout, MD Inpatient   01/23/2015  3:14 AM 02/01/2015 11:52 AM DNR [id number]  Etta Quill, DO ED   12/20/2012  5:34 PM 12/24/2012  5:29 PM DNR [id number]  Robbie Lis, MD Inpatient   12/13/2012 11:49 AM 12/20/2012  5:34 PM Full Code [id number]  Venetia Maxon Rama, MD Inpatient   09/17/2012 12:01 AM 09/21/2012  6:22 PM Full Code [id number]  Rise Patience, MD Inpatient       Prognosis:   Hours - Days  Discharge Planning:  Anticipated Hospital Death   She is too unstable to be moved from the hospital- she is nearing EOL, prognostication has been difficult but she is clearly demonstrating additional signs of nearing EOL.  She is appropriate for referral for [facility] - we are actively managing her symptoms, she is too fragile to move and family would benefit from  additional support.   Care plan was discussed with RN, Family  Thank you for allowing the Palliative Medicine Team to assist in the care of this patient.   Time In: 200 Time Out: 235 Total Time 75min Prolonged Time Billed no      Greater than 50%  of this time was spent counseling and coordinating care related to the above assessment and plan.  GOLDING,ELIZABETH, DO  Please contact Palliative Medicine Team phone at [phone] for questions and concerns.

## 2015-11-16 NOTE — Progress Notes (Addendum)
[facility] Admission - Liaison Note  This is a GIP admission as of 11/02/2015, diagnosis Sepsis A41.9, per Dr. Clifton James Monguilod.   [facility] received request from Laurey Morale, Palliative Medicine Team RN, for family interest in hospice hospital admission. Request confirmed my RNCM Heather Wile. Met with patient's HCPOA/D-I-L Pamala Hurry to explain services, complete admission paper work and provide patient care notebook. Pamala Hurry very pleased Dr. Hilma Favors to assume attending service. Barbara aware [facility] RN Becky Augusta and [facility] SW Erling Conte will visit daily and collaborate with Dr. Hilma Favors and hospital staff. Pamala Hurry declined offer for [facility] chaplain support due to their needs are being met by their pastor. Forms taken to hospital admitting. Bedside RN, unit secretary, Laurey Morale and Dr. Hilma Favors all aware.  Family is requesting to use [facility] in [place]. Phone number is [phone].  Please do not hesitate to contact [facility] with questions or for support to this family.  At time of death, please call [facility] at [phone].  Thank you,  Erling Conte, LCSW [phone]

## 2015-12-01 NOTE — Progress Notes (Signed)
Pt's daughter in law, Jelisha Retallick, stated that the family wants patient's body to go to [employer] in [place], however the family's contact number for [employer] is the number for [facility].  Makeya Counter requested that this number be used to contact the funeral home. Patient placement notified.

## 2015-12-01 DEATH — deceased

## 2015-12-02 ENCOUNTER — Encounter: Payer: Medicare Other | Admitting: Internal Medicine

## 2015-12-10 NOTE — Discharge Summary (Signed)
Death Summary  Nicole Roberts [id number] DOB: [birth year] DOA: 2015-11-19  PCP: [name], MD   Admit date: 2015/11/19 Date of Death:7/18 08-10-15  Final Diagnoses:  Principal Problem:   Sepsis ([place]) Active Problems:   Hypothyroidism   Rheumatoid arthritis ([place])   Thrush   Cellulitis   AKI (acute kidney injury) ([place])   CKD (chronic kidney disease)   End of life care   Arterial hypotension     History of present illness:  82 y.o. female with medical history significant for but not limited to, hypertension, stage III chronic kidney disease and chronic combined systolic and diastolic heart failure. Patient admitted mid-June with left lower extremity cellulitis refractory to outpatient management . she had acute on chronic back pain as well, CT scan showed a sacral fracture and patient underwent bilateral sacroplasty by IR. Following that patient developed a RLE DVT. She was started on Heparin but developed a large rectus sheath hematoma requiring an IVC filter. Patient was discharged on Augmentin 6/16.  Hospital Course:  1. Sepsis- secondary to left lower extremity cellulitis, patient was started on vancomycin and Zosyn. Which was  discontinued after palliative care discussion. Patient made full comfort care. Continued on  Dilaudid infusion. 2. Goals of care discussion- as per palliative care, patient was made full comfort care.  Patient was provided end of life care in the hospital and expired on 11-26-2015     Signed:  [facility] S  [facility] 12/10/2015, 7:47 AM

## 2016-09-03 IMAGING — CT CT ABD-PELV W/O CM
2 of 4 series · 16 of 46 positions shown, 18 images · non-contrast
Comparison: CT abdomen pelvis 10/25/2015

CLINICAL DATA: Rectus sheath hematoma subsequent encounter. Chronic
anticoagulation for atrial fibrillation

EXAM:
CT ABDOMEN AND PELVIS WITHOUT CONTRAST
TECHNIQUE: Multidetector CT imaging of the abdomen and pelvis was performed
following the standard protocol without IV contrast.

[Series 2: abd/ pelvis 5.0 i40f 2 · axial · 0.75mm/px · z∈[-465,-85]mm · 13 of 84 slices shown, 15 images]
[im 4/84  soft-tissue]
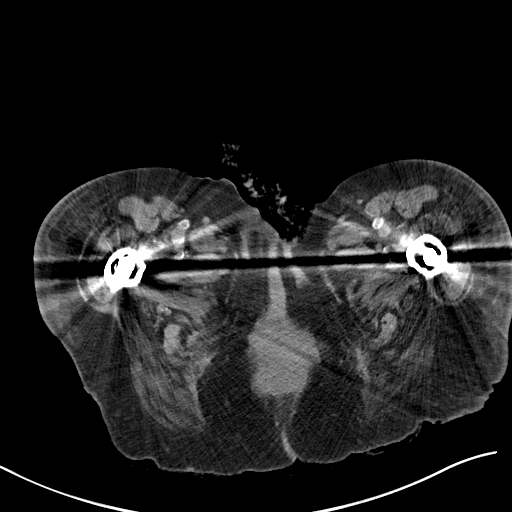
[im 4/84  bone]
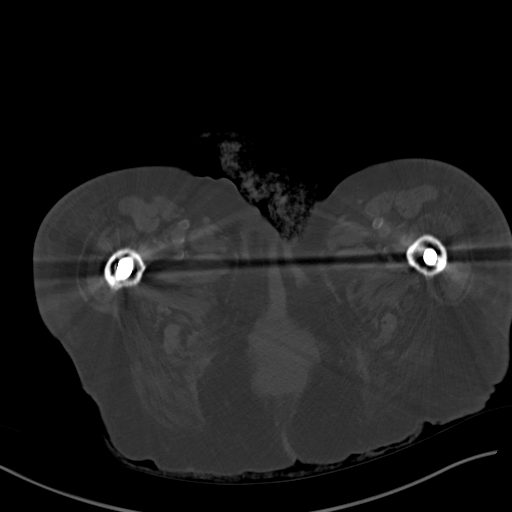
[im 10/84  soft-tissue]
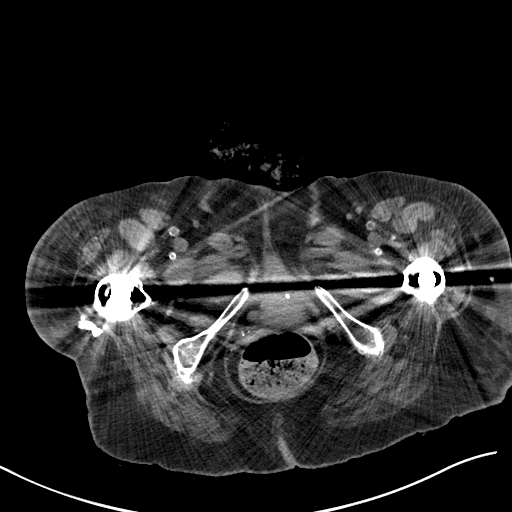
[im 17/84  soft-tissue]
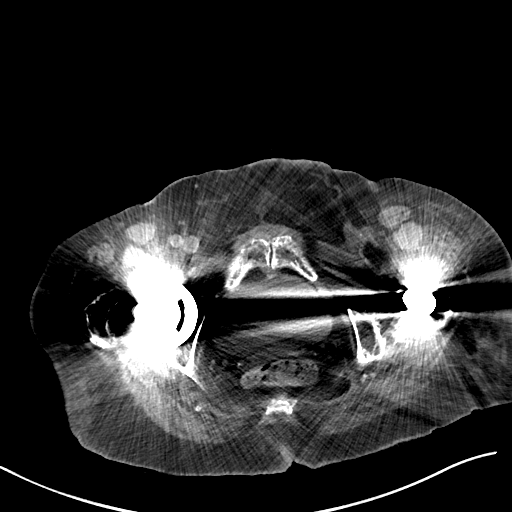
[im 27/84  soft-tissue]
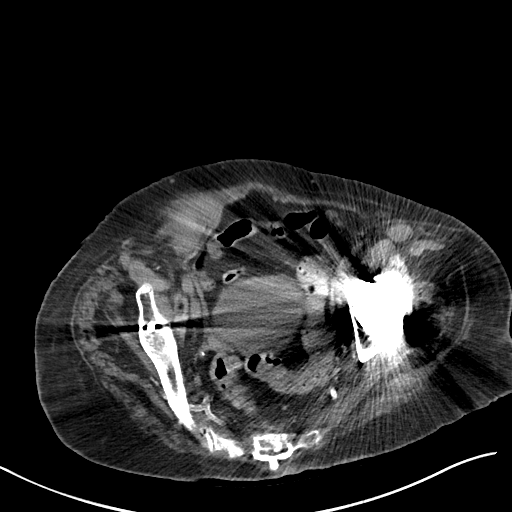
[im 30/84  soft-tissue]
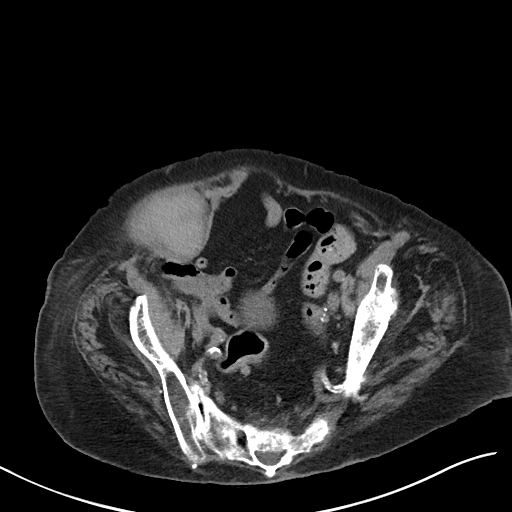
[im 37/84  soft-tissue]
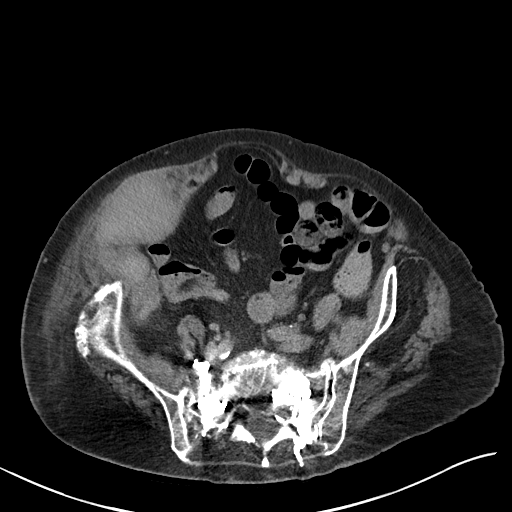
[im 44/84  soft-tissue]
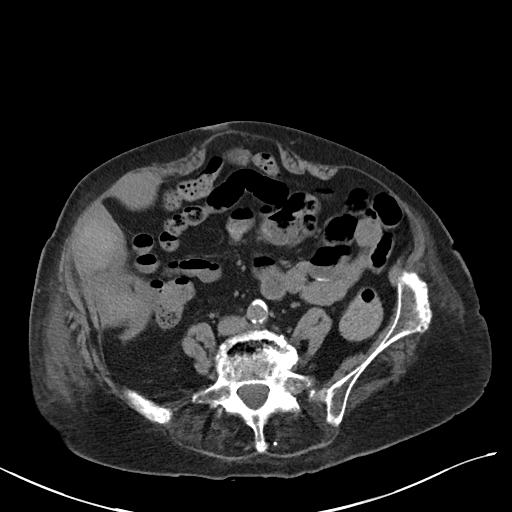
[im 50/84  soft-tissue]
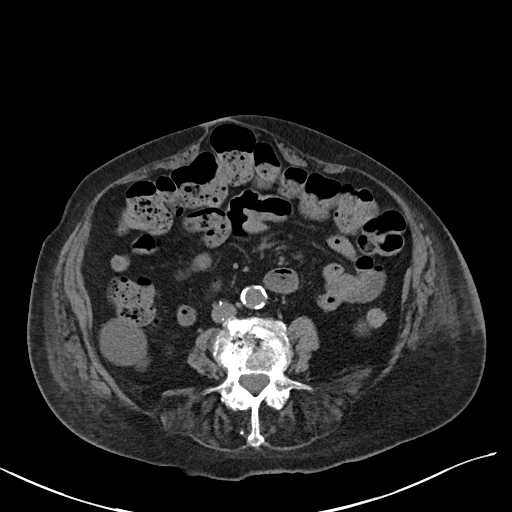
[im 57/84  soft-tissue]
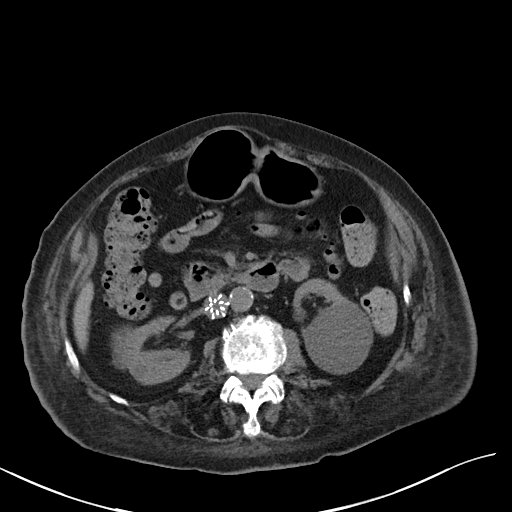
[im 57/84  bone]
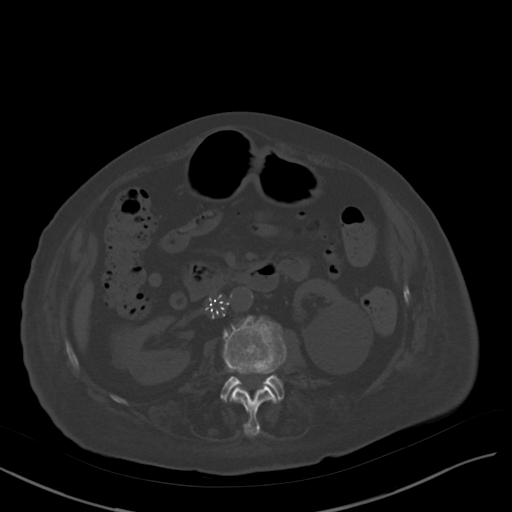
[im 60/84  soft-tissue]
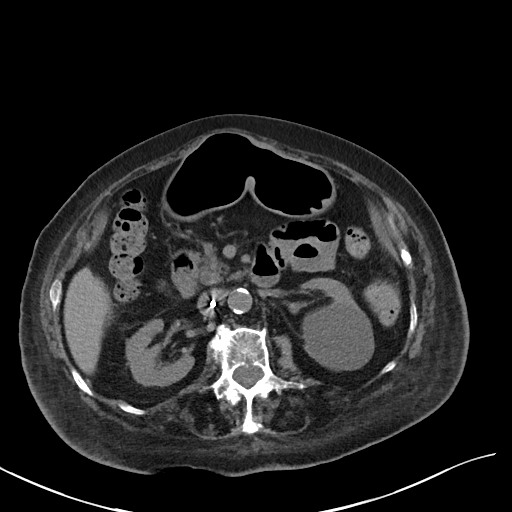
[im 67/84  soft-tissue]
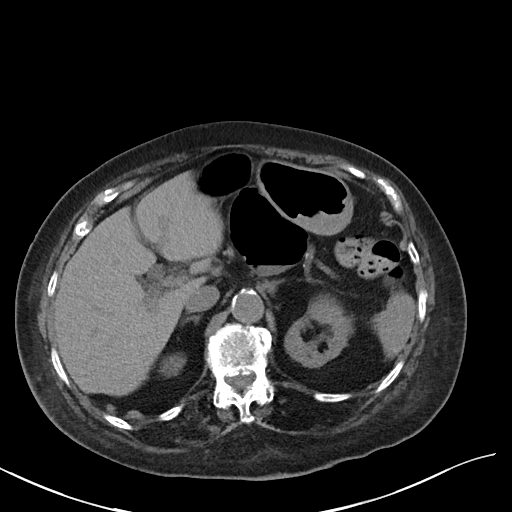
[im 74/84  soft-tissue]
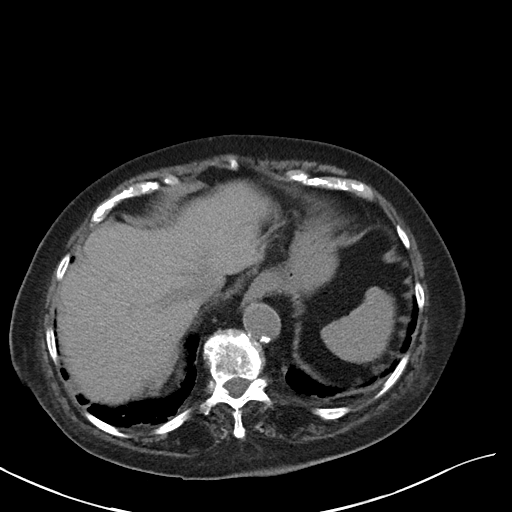
[im 80/84  soft-tissue]
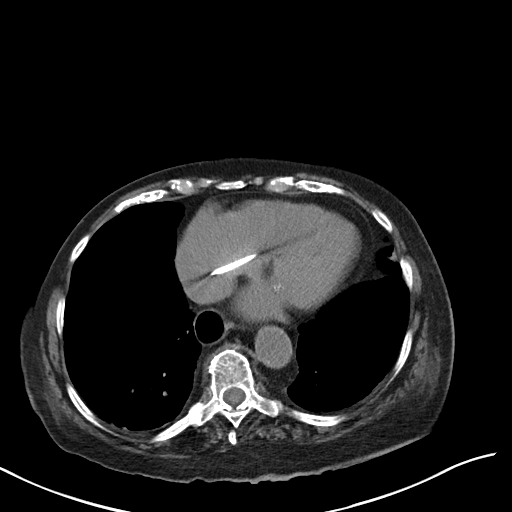

[Series 5: cor st · coronal · 0.64mm/px · 3 of 95 slices shown]
[im 32/95  soft-tissue]
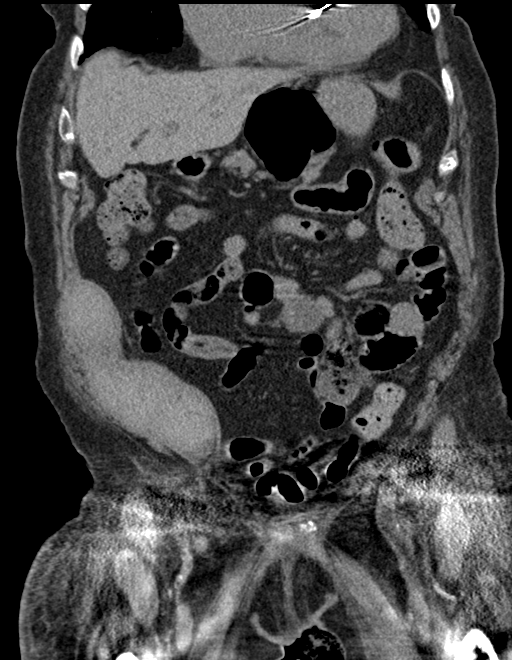
[im 42/95  soft-tissue]
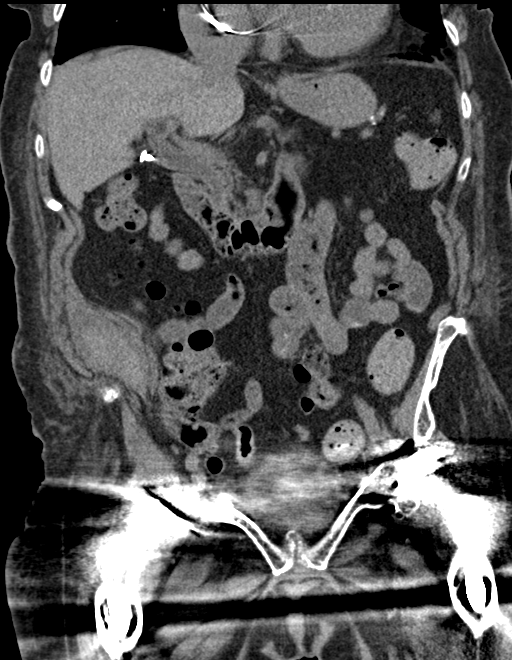
[im 53/95  soft-tissue]
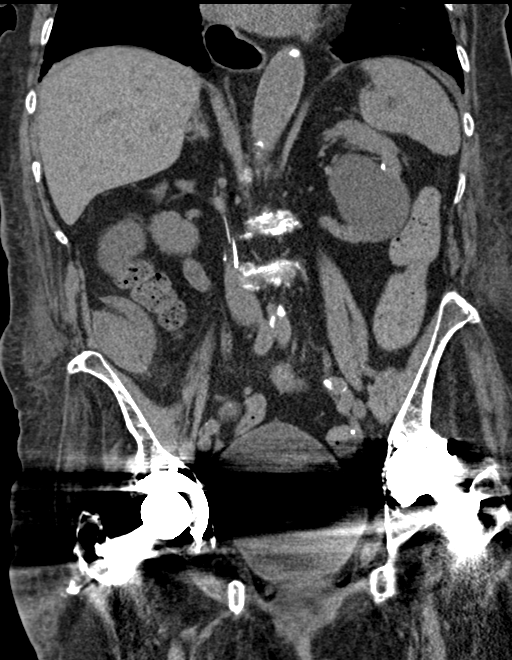

[16 of 46 positions shown; findings below may reference images not displayed]

FINDINGS: Lower chest: Mild scarring in the lung bases. No infiltrate or
effusion. Cardiac enlargement with dual lead pacemaker.

Hepatobiliary: No liver lesion. Cholecystectomy. Bile ducts
nondilated.

Pancreas: Negative

Spleen: Negative

Adrenals/Urinary Tract: 3 mm nonobstructing midpole stone on the
left. Bilateral renal cysts unchanged. No renal obstruction. Urinary
bladder normal.

Stomach/Bowel: Negative for bowel obstruction. No bowel mass or
edema. Moderate stool in the colon. Normal appendix.

Vascular/Lymphatic: Atherosclerotic calcification aorta and iliac
arteries without aneurysm. IVC filter unchanged in position below
the level of the renal veins. No lymphadenopathy

Reproductive: Hysterectomy.  No pelvic mass.

Other: Mild improvement in abdominal wall hematoma on the right.
This involves the right rectus muscle and right internal oblique
muscle. Both components are slightly smaller today. No new
hemorrhage. No free fluid in the peritoneal cavity

Musculoskeletal: Bilateral hip replacement causing streak artifact
in the pelvis. Extensive disc degeneration and spurring throughout
the lower thoracic and entire lumbar spine. Negative for fracture.
IMPRESSION: Intramuscular hematoma right rectus muscle and internal oblique
muscle slightly improved. No new bleeding since prior study.

Constipation

## 2017-05-01 NOTE — Telephone Encounter (Signed)
error
